# Patient Record
Sex: Male | Born: 1960 | Race: Black or African American | Hispanic: No | Smoking: Current every day smoker
Health system: Southern US, Community
[De-identification: ages and names within clinical notes are randomized; demographics above are authoritative.]

## PROBLEM LIST (undated history)

## (undated) DIAGNOSIS — F32A Depression, unspecified: Secondary | ICD-10-CM

## (undated) DIAGNOSIS — F419 Anxiety disorder, unspecified: Secondary | ICD-10-CM

## (undated) DIAGNOSIS — I639 Cerebral infarction, unspecified: Secondary | ICD-10-CM

## (undated) DIAGNOSIS — G4730 Sleep apnea, unspecified: Secondary | ICD-10-CM

## (undated) DIAGNOSIS — M1990 Unspecified osteoarthritis, unspecified site: Secondary | ICD-10-CM

## (undated) DIAGNOSIS — F17200 Nicotine dependence, unspecified, uncomplicated: Secondary | ICD-10-CM

## (undated) DIAGNOSIS — R569 Unspecified convulsions: Secondary | ICD-10-CM

## (undated) DIAGNOSIS — F319 Bipolar disorder, unspecified: Secondary | ICD-10-CM

## (undated) DIAGNOSIS — F329 Major depressive disorder, single episode, unspecified: Secondary | ICD-10-CM

## (undated) DIAGNOSIS — I739 Peripheral vascular disease, unspecified: Secondary | ICD-10-CM

## (undated) DIAGNOSIS — Z8659 Personal history of other mental and behavioral disorders: Secondary | ICD-10-CM

## (undated) DIAGNOSIS — I10 Essential (primary) hypertension: Secondary | ICD-10-CM

## (undated) DIAGNOSIS — F4310 Post-traumatic stress disorder, unspecified: Secondary | ICD-10-CM

## (undated) HISTORY — DX: Major depressive disorder, single episode, unspecified: F32.9

## (undated) HISTORY — DX: Essential (primary) hypertension: I10

## (undated) HISTORY — DX: Peripheral vascular disease, unspecified: I73.9

## (undated) HISTORY — DX: Nicotine dependence, unspecified, uncomplicated: F17.200

## (undated) HISTORY — DX: Anxiety disorder, unspecified: F41.9

## (undated) HISTORY — DX: Post-traumatic stress disorder, unspecified: F43.10

## (undated) HISTORY — DX: Depression, unspecified: F32.A

## (undated) HISTORY — DX: Unspecified convulsions: R56.9

## (undated) HISTORY — DX: Bipolar disorder, unspecified: F31.9

---

## 2002-06-23 ENCOUNTER — Inpatient Hospital Stay (HOSPITAL_COMMUNITY): Admission: EM | Admit: 2002-06-23 | Discharge: 2002-06-25 | Payer: Self-pay | Admitting: Emergency Medicine

## 2002-06-23 ENCOUNTER — Encounter: Payer: Self-pay | Admitting: Emergency Medicine

## 2002-06-24 ENCOUNTER — Encounter: Payer: Self-pay | Admitting: Internal Medicine

## 2002-07-04 ENCOUNTER — Encounter: Admission: RE | Admit: 2002-07-04 | Discharge: 2002-07-04 | Payer: Self-pay | Admitting: Internal Medicine

## 2006-07-01 ENCOUNTER — Emergency Department (HOSPITAL_COMMUNITY): Admission: EM | Admit: 2006-07-01 | Discharge: 2006-07-01 | Payer: Self-pay | Admitting: Emergency Medicine

## 2007-02-13 ENCOUNTER — Emergency Department (HOSPITAL_COMMUNITY): Admission: EM | Admit: 2007-02-13 | Discharge: 2007-02-13 | Payer: Self-pay | Admitting: Emergency Medicine

## 2007-11-18 ENCOUNTER — Emergency Department (HOSPITAL_COMMUNITY): Admission: EM | Admit: 2007-11-18 | Discharge: 2007-11-18 | Payer: Self-pay | Admitting: Emergency Medicine

## 2008-08-31 ENCOUNTER — Ambulatory Visit: Payer: Self-pay | Admitting: Family Medicine

## 2008-09-03 ENCOUNTER — Inpatient Hospital Stay (HOSPITAL_COMMUNITY): Admission: EM | Admit: 2008-09-03 | Discharge: 2008-09-04 | Payer: Self-pay | Admitting: Emergency Medicine

## 2008-09-06 ENCOUNTER — Ambulatory Visit: Payer: Self-pay | Admitting: Family Medicine

## 2008-10-05 ENCOUNTER — Ambulatory Visit: Payer: Self-pay | Admitting: Family Medicine

## 2008-11-02 ENCOUNTER — Ambulatory Visit: Payer: Self-pay | Admitting: Family Medicine

## 2008-11-30 ENCOUNTER — Ambulatory Visit: Payer: Self-pay | Admitting: Family Medicine

## 2009-11-07 ENCOUNTER — Emergency Department (HOSPITAL_COMMUNITY): Admission: EM | Admit: 2009-11-07 | Discharge: 2009-11-07 | Payer: Self-pay | Admitting: Emergency Medicine

## 2009-12-31 ENCOUNTER — Inpatient Hospital Stay (HOSPITAL_COMMUNITY): Admission: EM | Admit: 2009-12-31 | Discharge: 2010-01-02 | Payer: Self-pay | Admitting: Emergency Medicine

## 2010-01-07 ENCOUNTER — Ambulatory Visit: Payer: Self-pay | Admitting: Family Medicine

## 2010-01-17 ENCOUNTER — Ambulatory Visit: Payer: Self-pay | Admitting: Family Medicine

## 2010-06-13 ENCOUNTER — Ambulatory Visit: Payer: Self-pay | Admitting: Family Medicine

## 2010-06-14 ENCOUNTER — Ambulatory Visit (INDEPENDENT_AMBULATORY_CARE_PROVIDER_SITE_OTHER): Payer: Federal, State, Local not specified - PPO | Admitting: Family Medicine

## 2010-06-14 DIAGNOSIS — G40909 Epilepsy, unspecified, not intractable, without status epilepticus: Secondary | ICD-10-CM

## 2010-06-14 DIAGNOSIS — N390 Urinary tract infection, site not specified: Secondary | ICD-10-CM

## 2010-06-14 DIAGNOSIS — L989 Disorder of the skin and subcutaneous tissue, unspecified: Secondary | ICD-10-CM

## 2010-06-28 ENCOUNTER — Ambulatory Visit: Payer: Federal, State, Local not specified - PPO | Admitting: Family Medicine

## 2010-07-04 ENCOUNTER — Ambulatory Visit (INDEPENDENT_AMBULATORY_CARE_PROVIDER_SITE_OTHER): Payer: Federal, State, Local not specified - PPO | Admitting: Family Medicine

## 2010-07-04 DIAGNOSIS — N390 Urinary tract infection, site not specified: Secondary | ICD-10-CM

## 2010-07-26 LAB — PHENYTOIN LEVEL, TOTAL
Phenytoin Lvl: 12.8 ug/mL (ref 10.0–20.0)
Phenytoin Lvl: 6.7 ug/mL — ABNORMAL LOW (ref 10.0–20.0)

## 2010-07-26 LAB — DIFFERENTIAL
Basophils Absolute: 0 10*3/uL (ref 0.0–0.1)
Eosinophils Relative: 0 % (ref 0–5)
Lymphocytes Relative: 7 % — ABNORMAL LOW (ref 12–46)
Lymphs Abs: 0.7 10*3/uL (ref 0.7–4.0)
Lymphs Abs: 1 10*3/uL (ref 0.7–4.0)
Monocytes Absolute: 0.6 10*3/uL (ref 0.1–1.0)
Neutro Abs: 8.1 10*3/uL — ABNORMAL HIGH (ref 1.7–7.7)

## 2010-07-26 LAB — URINALYSIS, ROUTINE W REFLEX MICROSCOPIC
Bilirubin Urine: NEGATIVE
Protein, ur: NEGATIVE mg/dL
pH: 6 (ref 5.0–8.0)

## 2010-07-26 LAB — CBC
HCT: 37.7 % — ABNORMAL LOW (ref 39.0–52.0)
Hemoglobin: 12.7 g/dL — ABNORMAL LOW (ref 13.0–17.0)
MCH: 28.7 pg (ref 26.0–34.0)
MCH: 28.7 pg (ref 26.0–34.0)
MCHC: 33 g/dL (ref 30.0–36.0)
MCV: 85.9 fL (ref 78.0–100.0)
MCV: 86.9 fL (ref 78.0–100.0)
Platelets: 156 10*3/uL (ref 150–400)
RBC: 4.43 MIL/uL (ref 4.22–5.81)
RDW: 15.6 % — ABNORMAL HIGH (ref 11.5–15.5)
WBC: 11.6 10*3/uL — ABNORMAL HIGH (ref 4.0–10.5)

## 2010-07-26 LAB — BASIC METABOLIC PANEL
BUN: 20 mg/dL (ref 6–23)
CO2: 26 mEq/L (ref 19–32)
Chloride: 107 mEq/L (ref 96–112)
Creatinine, Ser: 1.39 mg/dL (ref 0.4–1.5)
GFR calc Af Amer: >60 mL/min (ref >60)
Glucose, Bld: 98 mg/dL (ref 70–99)
Sodium: 140 mEq/L (ref 135–145)

## 2010-07-26 LAB — COMPREHENSIVE METABOLIC PANEL
AST: 21 U/L (ref 0–37)
Albumin: 3.4 g/dL — ABNORMAL LOW (ref 3.5–5.2)
Alkaline Phosphatase: 61 U/L (ref 39–117)
Creatinine, Ser: 1.22 mg/dL (ref 0.4–1.5)
GFR calc Af Amer: >60 mL/min (ref >60)
Sodium: 139 mEq/L (ref 135–145)
Total Bilirubin: 0.5 mg/dL (ref 0.3–1.2)

## 2010-07-26 LAB — CULTURE, BLOOD (ROUTINE X 2): Culture: NO GROWTH

## 2010-07-26 LAB — TSH: TSH: 1.14 u[IU]/mL (ref 0.350–4.500)

## 2010-07-26 LAB — RAPID URINE DRUG SCREEN, HOSP PERFORMED
Barbiturates: NOT DETECTED
Benzodiazepines: NOT DETECTED
Cocaine: NOT DETECTED
Opiates: NOT DETECTED
Tetrahydrocannabinol: POSITIVE — AB

## 2010-07-26 LAB — URINE CULTURE
Culture  Setup Time: 201108230045
Culture: NO GROWTH

## 2010-07-26 LAB — URINE MICROSCOPIC-ADD ON

## 2010-07-28 LAB — BASIC METABOLIC PANEL
Calcium: 9.7 mg/dL (ref 8.4–10.5)
Chloride: 100 mEq/L (ref 96–112)
Creatinine, Ser: 1.32 mg/dL (ref 0.4–1.5)
GFR calc Af Amer: >60 mL/min (ref >60)
GFR calc non Af Amer: 58 mL/min — ABNORMAL LOW (ref >60)
Glucose, Bld: 110 mg/dL — ABNORMAL HIGH (ref 70–99)
Sodium: 137 mEq/L (ref 135–145)

## 2010-07-28 LAB — CBC
HCT: 40.5 % (ref 39.0–52.0)
Hemoglobin: 13.6 g/dL (ref 13.0–17.0)
MCH: 29 pg (ref 26.0–34.0)
MCHC: 33.6 g/dL (ref 30.0–36.0)
MCV: 86.4 fL (ref 78.0–100.0)
Platelets: 197 10*3/uL (ref 150–400)
RBC: 4.69 MIL/uL (ref 4.22–5.81)
WBC: 9.2 10*3/uL (ref 4.0–10.5)

## 2010-07-28 LAB — DIFFERENTIAL
Basophils Relative: 0 % (ref 0–1)
Eosinophils Absolute: 0 10*3/uL (ref 0.0–0.7)
Eosinophils Relative: 1 % (ref 0–5)
Lymphocytes Relative: 16 % (ref 12–46)
Lymphs Abs: 1.5 10*3/uL (ref 0.7–4.0)
Monocytes Absolute: 0.4 10*3/uL (ref 0.1–1.0)
Neutrophils Relative %: 80 % — ABNORMAL HIGH (ref 43–77)

## 2010-08-21 LAB — POCT I-STAT, CHEM 8
HCT: 48 % (ref 39.0–52.0)
Hemoglobin: 16.3 g/dL (ref 13.0–17.0)
Potassium: 4.2 mEq/L (ref 3.5–5.1)
Sodium: 139 mEq/L (ref 135–145)

## 2010-08-21 LAB — COMPREHENSIVE METABOLIC PANEL
ALT: 21 U/L (ref 0–53)
Albumin: 4.3 g/dL (ref 3.5–5.2)
Alkaline Phosphatase: 90 U/L (ref 39–117)
Alkaline Phosphatase: 94 U/L (ref 39–117)
BUN: 11 mg/dL (ref 6–23)
Creatinine, Ser: 1.36 mg/dL (ref 0.4–1.5)
Glucose, Bld: 121 mg/dL — ABNORMAL HIGH (ref 70–99)
Potassium: 3.5 mEq/L (ref 3.5–5.1)
Total Bilirubin: 0.5 mg/dL (ref 0.3–1.2)
Total Bilirubin: 0.6 mg/dL (ref 0.3–1.2)
Total Protein: 6.6 g/dL (ref 6.0–8.3)

## 2010-08-21 LAB — RAPID URINE DRUG SCREEN, HOSP PERFORMED
Barbiturates: NOT DETECTED
Cocaine: NOT DETECTED
Opiates: NOT DETECTED

## 2010-08-21 LAB — URINALYSIS, ROUTINE W REFLEX MICROSCOPIC
Bilirubin Urine: NEGATIVE
Ketones, ur: NEGATIVE mg/dL
Nitrite: NEGATIVE
Urobilinogen, UA: 1 mg/dL (ref 0.0–1.0)

## 2010-08-21 LAB — CBC
HCT: 42.6 % (ref 39.0–52.0)
HCT: 44.1 % (ref 39.0–52.0)
Hemoglobin: 14.3 g/dL (ref 13.0–17.0)
MCV: 85.8 fL (ref 78.0–100.0)
MCV: 86.7 fL (ref 78.0–100.0)
RBC: 5.14 MIL/uL (ref 4.22–5.81)
RDW: 16.1 % — ABNORMAL HIGH (ref 11.5–15.5)
RDW: 16.9 % — ABNORMAL HIGH (ref 11.5–15.5)

## 2010-08-21 LAB — PROTIME-INR
INR: 1 (ref 0.00–1.49)
Prothrombin Time: 13.6 seconds (ref 11.6–15.2)

## 2010-08-21 LAB — DIFFERENTIAL
Basophils Relative: 0 % (ref 0–1)
Eosinophils Relative: 2 % (ref 0–5)
Neutro Abs: 5.3 10*3/uL (ref 1.7–7.7)

## 2010-08-21 LAB — PHENYTOIN LEVEL, TOTAL
Phenytoin Lvl: 15 ug/mL (ref 10.0–20.0)
Phenytoin Lvl: 7 ug/mL — ABNORMAL LOW (ref 10.0–20.0)

## 2010-08-21 LAB — URINE CULTURE
Colony Count: NO GROWTH
Culture: NO GROWTH

## 2010-09-21 ENCOUNTER — Inpatient Hospital Stay (INDEPENDENT_AMBULATORY_CARE_PROVIDER_SITE_OTHER)
Admission: RE | Admit: 2010-09-21 | Discharge: 2010-09-21 | Disposition: A | Discharge status: Home / Self Care | Payer: Federal, State, Local not specified - PPO | Source: Ambulatory Visit | Attending: Emergency Medicine | Admitting: Emergency Medicine

## 2010-09-21 DIAGNOSIS — L03119 Cellulitis of unspecified part of limb: Secondary | ICD-10-CM

## 2010-09-21 DIAGNOSIS — T148 Other injury of unspecified body region: Secondary | ICD-10-CM

## 2010-09-24 ENCOUNTER — Encounter: Payer: Self-pay | Admitting: Family Medicine

## 2010-09-24 ENCOUNTER — Ambulatory Visit (INDEPENDENT_AMBULATORY_CARE_PROVIDER_SITE_OTHER): Payer: Federal, State, Local not specified - PPO | Admitting: Family Medicine

## 2010-09-24 VITALS — BP 160/100 | HR 76 | Wt 256.0 lb

## 2010-09-24 DIAGNOSIS — L03116 Cellulitis of left lower limb: Secondary | ICD-10-CM

## 2010-09-24 DIAGNOSIS — L02419 Cutaneous abscess of limb, unspecified: Secondary | ICD-10-CM

## 2010-09-24 NOTE — Discharge Summary (Signed)
Keith Dixon, Keith Dixon              ACCOUNT NO.:  [id number]   MEDICAL RECORD NO.:  [id number]          PATIENT TYPE:  INP   LOCATION:  5502                         FACILITY:  [facility]   PHYSICIAN:  Ruthy Dick, MD    DATE OF BIRTH:  [birth year]   DATE OF ADMISSION:  09/03/2008  DATE OF DISCHARGE:  09/04/2008                               DISCHARGE SUMMARY   REASON FOR ADMISSION:  Seizure disorder and hypertensive crisis.   FINAL DISCHARGE DIAGNOSES:  1. Recurrent seizures (seizure disorder).  2. Subtherapeutic Dilantin level.  3. Urosepsis.  4. Tolerated hypertension.  5. Polysubstance abuse.  6. Noncompliance.  7. Marijuana abuse.  8. Posttraumatic stress disorder.   CONSULTS DURING THIS ADMISSION:  Neurology consult.   PROCEDURES DONE DURING THIS ADMISSION:  1. CT scan of the head with contrast which did not show any acute      intracranial abnormalities.  2. EKG was done which has not been reported at this time.   BRIEF HISTORY OF PRESENT ILLNESS AND HOSPITAL COURSE:  This is a 47-year  African American male who seems to be very noncompliant with his  medications and has a history of seizure disorder and adjustment  disorder.  He presented to the hospital.  The reason for his seizures  seems to be that he has been very noncompliant with his medication.  At  the current time, he is not any of his medications for about 4 years now  and just recently saw Dr. Susann Givens who restarted on his medications.  He  says he has an appointment with Dr. Susann Givens on Wednesday, September 06, 2008, and we recommend that he goes for this appointment.  Today, the  patient has done well, has not had any seizures since admission, no  chest pain, no shortness of breath, no abdominal pain, no nausea, no  vomiting, no diarrhea, no constipation.   VITAL SIGNS:  Temperature of 98.2, pulse 70, respirations 20, blood  pressure 140/82, and saturating 94% on room air.  CHEST:  Clear to auscultation  bilaterally.  No rhonchi, no rales, no  wheezing.  ABDOMEN:  Soft and nontender.  EXTREMITIES:  No clubbing, no cyanosis, no edema.  CARDIOVASCULAR:  First and second heart sounds only.  CENTRAL NERVOUS SYSTEM:  Nonfocal.   Time used for discharge planning is less than 30 minutes.  Again,  followup with Dr. Susann Givens should be on Tuesday, September 06, 2008, and  according to the patient this appointment has already been set.  We are  going to discharge him home today.   DISCHARGE MEDICATIONS:  1. Dilantin 400 mg p.o. nightly.  2. Lisinopril and hydrochlorothiazide 20/12.5 mg p.o. daily.  3. Cipro 500 mg p.o. b.i.d. for 2 days.      Ruthy Dick, MD  Electronically Signed     GU/MEDQ  D:  09/04/2008  T:  09/05/2008  Job:  [id number]   cc:   Sharlot Gowda, M.D.

## 2010-09-24 NOTE — Patient Instructions (Signed)
Keep her leg elevated. Continue on the antibiotic and pain medications. Turn here on Friday. If this gets worse before then call for an appointment

## 2010-09-24 NOTE — Progress Notes (Signed)
  Subjective:    Patient ID: Keith Dixon, male    DOB: 29-Apr-1961, 50 y.o.   MRN: [id number]  HPI he is here for evaluation of an infection on his left calf. Noted an itching sensation Sergi morning and severity evening noted some swelling about the size of a dime. He was seen in an urgent care Center. They placed him on antibiotic and given pain medications as well as a topical ointment. He is here for a recheck. He states that the pain and welling is less however is more erythematous. Continues on meds as listed in the chart. He is having some slight diaphoresis.    Review of Systems     Objective:   Physical Exam alert and slightly diaphoretic. Left lateral calf and the mid position shows a listed lesion approximately 3 cm in size. It has surrounding erythema and slightly warm to touch.        Assessment & Plan:  Left calf daylight is. The wound was opened and cultured. We. Material was expressed. The wound was dressed. He can change the dressing daily.

## 2010-09-24 NOTE — H&P (Signed)
Keith Dixon, Keith Dixon              ACCOUNT NO.:  [id number]   MEDICAL RECORD NO.:  [id number]          PATIENT TYPE:  INP   LOCATION:  1826                         FACILITY:  [facility]   PHYSICIAN:  Darryl D. Prime, MD    DATE OF BIRTH:  Jul 28, 1960   DATE OF ADMISSION:  09/02/2008  DATE OF DISCHARGE:                              HISTORY & PHYSICAL   PRIMARY CARE PHYSICIAN:  Sharlot Gowda, M.D.   CHIEF COMPLAINT:  The patient was drowsy. Per his significant other and  his sister he has been having seizures.   HISTORY OF PRESENT ILLNESS:  Keith Dixon is a 50 year old male with a  history of seizure disorder who while at work around 1:00 p.m., had  apparent seizure. The co-workers brought him home. 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 Unsure of the details of that seizure but apparently,  it was typical for his type of seizures. He has a history of partial  seizures. The patient was at home and had another episodes of seizure  and he was brought because of that. He had a seizure in triage and then  2 seizures in the emergency room here, witnessed by the nurse  practitioner. He had smacking of the lips and his head turned to the  right. He was sweaty and big-eyed and flaccid until he was given Ativan.  He was loaded with Dilantin as well, 1000 mg. He was seen by neurology,  who recommended him continuing his Dilantin, checking a Dilantin level  in the morning. He has been non-compliant in the past and missed his  Dilantin dose last night.   PAST MEDICAL HISTORY:  1. History of pneumonia with rhabdomyolysis and hypoxia at that time.      This was in 2004.  2. History of possible polysubstance abuse.  3. History of seizure disorder as above.  4. History of hypertension.  5. History of post-traumatic stress disorder.   ALLERGIES:  NO KNOWN DRUG ALLERGIES.   MEDICATIONS:  1. Dilantin 400 mg q.h.s.   SOCIAL HISTORY:  Positive for Cannabis use. Occasional alcohol but this  has been  very heavy in the past. History of tobacco use, which is  ongoing.   FAMILY HISTORY:  Mother had seizures.   REVIEW OF SYSTEMS:  14 point review of systems negative unless stated  above.   PHYSICAL EXAMINATION:  VITAL SIGNS:  Temperature 99.2, pulse 100,  respiratory rate 20, sating 98% on room air. Blood pressure 220/117.  GENERAL:  The patient is in no acute distress. Lying flat in bed. He is  drowsy.  HEENT:  Normocephalic and atraumatic. Pupils are equal, round, and  reactive to light. Extraocular muscles intact. The oropharynx is dry.  Poor dentition.  NECK:  Supple. No lymphadenopathy or thyromegaly. No carotid bruits.  LUNGS:  Clear to auscultation bilaterally.  CARDIOVASCULAR:  Significant for an S4 but otherwise regular rate and  rhythm. No murmurs.  ABDOMEN:  Soft, nontender, and nondistended. No hepatosplenomegaly.  EXTREMITIES:  No clubbing, cyanosis, or edema.  NEUROLOGIC:  Alert and oriented x4. Cranial nerves 2-12 are  grossly  intact. Strength and sensation are grossly intact. I did not put a pin  light to his eyes for risk of inducing possible seizures.   LABORATORY DATA:  INR is 1.0 with a PT of 13.6, lipase 18, alcohol less  than 5. Urine drug screen positive for Cannabis. Dilantin level was 7.  Urinalysis showed a WBC of 7 to 10 with moderate leukocytes. Sodium 137,  potassium 4.1, chloride 104, bicarb 26, BUN 15 and a creatinine of 1.36.  Glucose 106, otherwise normal liver function studies. His CBC:  White  count is 7.0. Hemoglobin and hematocrit 14.8 and 44.1. Platelets  142,000. Segs of 76%.   CT of the head was negative for any intracranial abnormality. Chest x-  ray showed no acute cardiopulmonary disease. CT of the head is negative  for any intracranial abnormalities, as stated prior.   ASSESSMENT:  The patient with a history of seizures, now having acute  recurrent partial seizures, secondary to medical non-adherence.   PLAN:  At this time, he will  be observed closely with frequent  neurologic checks. Will get a urine culture to see if he indeed has a  urinary tract infection and recheck the UA. DVT and GI prophylaxis will  be ordered. Will counsel concerning the discontinuation of tobacco and  the use of Cannabis. For the ongoing seizures, we will check a Dilantin  level. Will continue Dilantin 400 q.h.s. and place on Ativan p.r.n.      Darryl D. Prime, MD  Electronically Signed     DDP/MEDQ  D:  09/03/2008  T:  09/03/2008  Job:  [id number]   cc:   Pramod P. Pearlean Brownie, MD

## 2010-09-24 NOTE — Consult Note (Signed)
NAMECARMON, Keith Dixon NO.:  [id number]   MEDICAL RECORD NO.:  [id number]          PATIENT TYPE:  INP   LOCATION:  1826                         FACILITY:  [facility]   PHYSICIAN:  Noel Christmas, MD    DATE OF BIRTH:  07-19-1960   DATE OF CONSULTATION:  09/02/2008  DATE OF DISCHARGE:                                 CONSULTATION   REFERRING PHYSICIAN:  Carleene Cooper, MD   REASON FOR CONSULTATION:  Recurrent seizure.   HISTORY OF PRESENT ILLNESS:  This is a 50 year old man with a history of  complex partial seizure disorder who presented with history of recurrent  partial seizure tonight.  The patient apparently was at work when the  seizure occurred.  A second seizure was witnessed by family members at  home.  The patient had staring spell with attentiveness followed by  confusion.  There is no history of generalized seizure activity nor  generalized seizure disorder.  He has been on Dilantin 400 mg at  bedtime.  He admits to less than ideal compliance and did not take  Dilantin last night.  Dilantin level in the emergency room here was 7.0  (normally it was 10.0-20.0).  The CT scan was obtained, which showed no  intracranial abnormality.  The patient was noted to also have markedly  elevated blood pressure on presentation to the emergency room (220/117).  He has not been taking blood pressure medicines, but has a past history  of hypertension.   PAST MEDICAL HISTORY:  Remarkable for seizure disorder, complex partial,  hypertension, and posttraumatic stress disorder.   CURRENT MEDICATIONS:  Dilantin 400 mg nightly.   FAMILY HISTORY:  Noncontributory.   PHYSICAL EXAMINATION:  Appearance was that of a middle-aged man who was  moderately overweight.  He was drowsy, but easy to arouse.  He was well  oriented to time as well as place.  Memory was normal except for period  of time during, which he was experiencing seizure activity.  Affect was  appropriate.  His  pupils were equal and reactive normally to light.  Extraocular movements and visual fields were normal.  There was no  facial weakness.  Hearing was normal.  Speech and palate movement were  normal.  Motor exam shows normal strength and normal tone throughout.  Deep tendon reflexes were normal and symmetrical.  Plantar responses  were flexor.  Sensory exam was normal.  Carotid auscultation was normal.   CLINICAL IMPRESSION:  1. Recurrent partial seizure secondary to low Dilantin level.  2. Uncontrolled hypertension.  3. Compliance issues with taking anticonvulsant medication as well as      antihypertensive medication.   RECOMMENDATIONS:  1. I agree with Dilantin load IV given followed by resumption of his      usual daily dose of 400 mg p.o.  2. Hypertension management per primary care team.  3. Follow up with Dr. Pearlean Brownie (Neurology) on routine basis.   Thank you for asking me to evaluate Mr. Masterson.      Noel Christmas, MD  Electronically Signed     CS/MEDQ  D:  09/02/2008  T:  09/03/2008  Job:  [id number]

## 2010-09-27 ENCOUNTER — Ambulatory Visit (INDEPENDENT_AMBULATORY_CARE_PROVIDER_SITE_OTHER): Payer: Federal, State, Local not specified - PPO | Admitting: Family Medicine

## 2010-09-27 ENCOUNTER — Encounter: Payer: Self-pay | Admitting: Family Medicine

## 2010-09-27 VITALS — BP 150/90 | HR 72 | Wt 258.0 lb

## 2010-09-27 DIAGNOSIS — L02419 Cutaneous abscess of limb, unspecified: Secondary | ICD-10-CM

## 2010-09-27 LAB — WOUND CULTURE: Gram Stain: NONE SEEN

## 2010-09-27 MED ORDER — LEVOFLOXACIN 500 MG PO TABS: 500.0000 mg | ORAL_TABLET | Freq: Every day | ORAL | Status: AC

## 2010-09-27 NOTE — Progress Notes (Signed)
  Subjective:    Patient ID: Keith Dixon, male    DOB: August 02, 1960, 50 y.o.   MRN: [id number]  HPI he is here for recheck on his leg infection. The culture came back with Staphylococcus sensitive to cephalosporin. He states it is causing less discomfort.    Review of Systems     Objective:   Physical Exam left leg exam does show less erythema and swelling. No tenderness to palpation       Assessment & Plan:  Resolving left leg cellulitis Will switch him to Levaquin. He will get a note to return to work Monday

## 2010-09-27 NOTE — Patient Instructions (Signed)
Keep the foot elevated through the weekend and he may wash it with soap and water. He may return to work on Monday unless there is any question and then return here for followup visit

## 2010-09-27 NOTE — Discharge Summary (Signed)
NAME:  CLAUDIO, MONDRY                        ACCOUNT NO.:  [id number]   MEDICAL RECORD NO.:  [id number]                   PATIENT TYPE:  INP   LOCATION:  5702                                 FACILITY:  [facility]   PHYSICIAN:  Madaline Guthrie, M.D.                 DATE OF BIRTH:  11/03/1960   DATE OF ADMISSION:  06/23/2002  DATE OF DISCHARGE:  06/25/2002                                 DISCHARGE SUMMARY   DISCHARGE DIAGNOSES:  1. Pneumonia.  2. Hypoxia.  3. Rhabdomyolysis.  4. Hypertension.  5. Pyuria.  6. Polysubstance use.   DISCHARGE MEDICATIONS:  1. Tequin 400 mg p.o. once daily.  2. Hydrochlorothiazide 25 mg p.o. once daily.   DISPOSITION AND FOLLOWUP:  The patient was discharged home in stable  condition.  He will be contacted by the internal medicine outpatient clinic  to arrange followup appointment.   PROCEDURE:  CT of the head without contrast was unremarkable.   ADMISSION HISTORY AND PHYSICAL EXAMINATION:   HISTORY OF PRESENT ILLNESS:  For full details of admission history and  physical please see the chart.  In brief, the patient is a 50 year old  African-American male with a past medical history significant for  questionable hypertension who was fine up until the morning of admission  when his girlfriend was notified by her children that he was breathing  funny.  She checked on him at that time and he was breathing fast and  making a humming nose when he breathed.  She also noticed that he had bitten  his tongue and that he had blood dripping from the left side of his mouth.  He was awake but confused and did not know who his girlfriend was.  He went  outside with only his shorts on and had steam coming from his body.  His  girlfriend brought him to the ED at which time she states that he is a lot  better now.  He had no loss of bladder and bowel function, has no history  of asthma or seizures, had no witnessed seizure activity.  He received  Rocephin and  azithromycin in the emergency department.   PHYSICAL EXAMINATION:  VITAL SIGNS:  Temperature 100.5 that went down to  98.2, pulse 91, respirations 26, blood pressure 175/91, O2 saturations 93%  on 2 L.  GENERAL:  The patient was alert and oriented x3.  He was conversant and  pleasant.  HEENT:  Pupils were equally round and reactive to light and accommodation.  Extraocular movements were intact.  He had an injected conjunctiva.  Fundi  were negative for exudate or hemorrhage.  Oropharynx was clear with no  blood.  There was evidence of trauma on the left tongue.  He had no tongue  deviation.  NECK:  Neck was supple without lymphadenopathy or meningismus.  RESPIRATORY:  Lungs had diffuse rales bilaterally, right greater than left.  CARDIOVASCULAR:  Heart  was regular rate and rhythm; no murmurs, gallops, or  rubs.  ABDOMEN:  Soft, nontender, nondistended, with positive bowel sounds; no  hepatosplenomegaly.  EXTREMITIES:  Warm with no cyanosis, clubbing, or edema.  NEUROLOGICAL:  Intact sensation throughout as well as 5/5 motor strength in  bilateral upper and lower extremities.  Deep tendon reflexes were normal.   LABORATORY DATA:  Labs demonstrated a CBC with a white count of 7.4,  hemoglobin 16.8, platelets of 167, MCV of 86.2, 81% neutrophils.  Complete  metabolic profile was remarkable for a mild hyperglycemia of 112.  Alcohol  level less than 5.  UA significant for trace hemoglobin, 30 of protein,  small leukocyte esterase, negative nitrites, 7-10 white blood cells, and  hyaline casts.  Urine drug screen was positive for marijuana.  An arterial  blood gas on 2 L demonstrated a pH of 7.395, pCO2 of 36, pO2 54, bicarb 22  with 89% saturation.  EKG showed normal sinus rhythm at 85 beats per minute  with questionable T-wave inversion in V6.  Chest x-ray showed a probable  left-upper-lobe-greater-than-right-upper-lobe pneumonia with a prominent  rounded configuration of the heart.  A PA  and lateral was repeated which  showed a normal heart configuration.   ASSESSMENT AND PLAN:  The patient was admitted to the medical teaching  service for evaluation and treatment of his probable pneumonia and hypoxia.   HOSPITAL COURSE:  Problem 1. PNEUMONIA AND HYPOXIA.  The patient was started  on Rocephin and azithromycin for treatment of community-acquired pneumonia.  His hypoxia resolved quickly after starting treatment.  Sputum cultures were  ordered and were pending at the time of discharge.  His O2 saturation the  day of discharge was 96% on room air.  The patient was discharged on a  course of Tequin.   Problem 2. RHABDOMYOLYSIS.  The patient had some mild rhabdomyolysis which  was thought to be secondary to a seizure which was probably due to hypoxia  from his pneumonia.  His CK decreased from 3333 to 2080 the day of  discharge.  His renal function remained normal throughout his  hospitalization.   Problem 3. HYPERTENSION.  The patient was started on hydrochlorothiazide for  treatment of his hypertension and this should be followed up as an  outpatient.   Problem 4. SEIZURES.  Although the patient had no witnessed seizure  activity, it was thought that he likely had a seizure secondary to probable  hypoxia from his pneumonia.  CT of the head was negative and no other workup  was done.   DISCHARGE LABORATORIES:  Day of discharge white blood cell count 9.1,  hemoglobin 13.4, platelets 145,000.  Basic metabolic profile the day of  discharge showed a sodium of 138, potassium 3.9, chloride 108, CO2 24, BUN  10, creatinine 1.2, glucose 105, calcium 8.8.  HIV was nonreactive.  Urine  culture showed Streptococcus viridans.  Sputum culture showed normal  oropharyngeal flora.  Smear of the sputum was negative for acid-fast bacilli  however, the culture is pending.   ISSUES TO BE FOLLOWED UP AS AN OUTPATIENT:  1. Hypertension.  2. Urinary tract infection. 3. Renal function  after rhabdomyolysis.     Michel Harrow, M.D.                        Madaline Guthrie, M.D.    KB/MEDQ  D:  06/28/2002  T:  06/28/2002  Job:  [id number]

## 2010-10-01 ENCOUNTER — Telehealth: Payer: Self-pay | Admitting: Family Medicine

## 2010-10-01 NOTE — Telephone Encounter (Signed)
Called [facility] [address] for the bactroban ointment with 1 refill

## 2010-11-06 ENCOUNTER — Inpatient Hospital Stay (HOSPITAL_COMMUNITY)
Admission: AD | Admit: 2010-11-06 | Discharge: 2010-11-08 | DRG: 180 | Disposition: A | Discharge status: Home / Self Care | Payer: Federal, State, Local not specified - PPO | Source: Ambulatory Visit | Attending: Hospitalist | Admitting: Hospitalist

## 2010-11-06 ENCOUNTER — Telehealth: Payer: Self-pay | Admitting: Medical

## 2010-11-06 ENCOUNTER — Ambulatory Visit (INDEPENDENT_AMBULATORY_CARE_PROVIDER_SITE_OTHER): Payer: Federal, State, Local not specified - PPO | Admitting: Medical

## 2010-11-06 ENCOUNTER — Encounter: Payer: Self-pay | Admitting: Medical

## 2010-11-06 ENCOUNTER — Ambulatory Visit
Admission: RE | Admit: 2010-11-06 | Discharge: 2010-11-06 | Disposition: A | Discharge status: Home / Self Care | Payer: Federal, State, Local not specified - PPO | Source: Ambulatory Visit | Attending: Medical | Admitting: Medical

## 2010-11-06 DIAGNOSIS — R630 Anorexia: Secondary | ICD-10-CM

## 2010-11-06 DIAGNOSIS — R109 Unspecified abdominal pain: Secondary | ICD-10-CM

## 2010-11-06 DIAGNOSIS — I10 Essential (primary) hypertension: Secondary | ICD-10-CM | POA: Diagnosis present

## 2010-11-06 DIAGNOSIS — E875 Hyperkalemia: Secondary | ICD-10-CM | POA: Diagnosis present

## 2010-11-06 DIAGNOSIS — R110 Nausea: Secondary | ICD-10-CM

## 2010-11-06 DIAGNOSIS — F4310 Post-traumatic stress disorder, unspecified: Secondary | ICD-10-CM | POA: Diagnosis present

## 2010-11-06 DIAGNOSIS — K56609 Unspecified intestinal obstruction, unspecified as to partial versus complete obstruction: Principal | ICD-10-CM | POA: Diagnosis present

## 2010-11-06 DIAGNOSIS — F319 Bipolar disorder, unspecified: Secondary | ICD-10-CM | POA: Diagnosis present

## 2010-11-06 DIAGNOSIS — G40909 Epilepsy, unspecified, not intractable, without status epilepticus: Secondary | ICD-10-CM | POA: Diagnosis present

## 2010-11-06 DIAGNOSIS — F17200 Nicotine dependence, unspecified, uncomplicated: Secondary | ICD-10-CM | POA: Diagnosis present

## 2010-11-06 LAB — POCT URINALYSIS DIPSTICK
Glucose, UA: NEGATIVE
Ketones, UA: NEGATIVE
Spec Grav, UA: 1.01

## 2010-11-06 LAB — CBC WITH DIFFERENTIAL/PLATELET
Eosinophils Absolute: 0.2 10*3/uL (ref 0.0–0.7)
Lymphocytes Relative: 26 % (ref 12–46)
Lymphs Abs: 1.4 10*3/uL (ref 0.7–4.0)
Neutrophils Relative %: 50 % (ref 43–77)
Platelets: 241 10*3/uL (ref 150–400)
RBC: 4.98 MIL/uL (ref 4.22–5.81)
WBC: 5.5 10*3/uL (ref 4.0–10.5)

## 2010-11-06 LAB — COMPREHENSIVE METABOLIC PANEL
Albumin: 4.1 g/dL (ref 3.5–5.2)
CO2: 26 mEq/L (ref 19–32)
Glucose, Bld: 113 mg/dL — ABNORMAL HIGH (ref 70–99)
Potassium: 3.3 mEq/L — ABNORMAL LOW (ref 3.5–5.3)
Sodium: 137 mEq/L (ref 135–145)
Total Protein: 6.6 g/dL (ref 6.0–8.3)

## 2010-11-06 LAB — TSH: TSH: 0.926 u[IU]/mL (ref 0.350–4.500)

## 2010-11-06 LAB — MAGNESIUM: Magnesium: 2.1 mg/dL (ref 1.5–2.5)

## 2010-11-06 NOTE — Progress Notes (Signed)
  Subjective:   HPI  Keith Dixon is a 50 y.o. male who presents for abdominal pain and inability to pass stool x 5 days. He notes for the last 2 weeks he has had a change in his bowel movements. This started when he was finishing up an antibiotic (Levaquin) for recent leg infection that came after an insect bite. For the last 2 weeks he mostly has had 2-3 loose stools daily, however he has had no bowel movement at all since last Thursday 5 days ago. At first he was able to pass little gas, and now he is not able pass anything per rectum. He has subsequently had lower abdominal pain, increased nausea, decreased appetite and starting get some back pain as well.  No other aggravating or relieving factors.  No other c/o.  Last meal was half of a sub last evening.  NPO since last night.  Of note, he has lost 10 lb unexpectedly in the last month.  Prior to 1 mo ago his stool was normal, no hx/o constipation.  He denies blood in stool.  Denies prior colonoscopy or prior abdominal surgeries.  The following portions of the patient's history were reviewed and updated as appropriate: allergies, current medications, past family history, past medical history, past social history, past surgical history and problem list.  Past Medical History  Diagnosis Date  . Seizures   . Hypertension   . PTSD (post-traumatic stress disorder)   . Bipolar disorder   . Tobacco use disorder    History reviewed. No pertinent past surgical history.  Family History  Problem Relation Age of Onset  . Seizures Mother   . Cancer Mother     breast    Review of Systems Constitutional: +unexpected weight change, anorexia; denies fever, chills, sweats Cardiology: denies chest pain, palpitations, edema Respiratory: denies cough, shortness of breath, wheezing Gastroenterology: + abdominal pain, nausea; denies vomiting, blood in stool.  Musculoskeletal: denies arthralgias, myalgias Urology: denies dysuria, difficulty urinating,  hematuria, urinary frequency, urgency Neurology: no headache, weakness, tingling, numbness     Objective:   Physical Exam  General appearance: alert, WD/WN, in pain, lying on exam table, black male Skin: warm ,dry  Oral cavity: MMM, poor dentition Neck: supple, no lymphadenopathy, no thyromegaly, no masses Heart: RRR, normal S1, S2, no murmurs Lungs: CTA bilaterally, no wheezes, rhonchi, or rales Abdomen: diffusely decreased bowel sounds, dull to percussion, tender supra pubically, but no obvious distention, masses, no hepatomegaly, no splenomegaly Back: left CVA tenderness Extremities: no edema, no cyanosis, no clubbing Pulses: 2+ symmetric GU: non tender, normal male genitalia, no masses, no hernia Rectal: anus normal appearing, internal hemorrhoid palpated, but no stool palpated, only anterior portion of prostate palpated which was normal, occult neg stool  Assessment :    Encounter Diagnoses  Name Primary?  . Abdominal pain Yes  . Nausea   . Anorexia      Plan:   Discussed possible etiologies including bowel obstruction,constipation gastroparesis, or other. We will send for labs and KUB stat. He has not had anything by mouth since last evening, and I asked him to remain n.p.o. until he hears back from [place] in a couple hours.

## 2010-11-07 ENCOUNTER — Inpatient Hospital Stay (HOSPITAL_COMMUNITY): Payer: Federal, State, Local not specified - PPO

## 2010-11-07 LAB — CBC
HCT: 38.6 % — ABNORMAL LOW (ref 39.0–52.0)
Hemoglobin: 13.5 g/dL (ref 13.0–17.0)
MCH: 28.5 pg (ref 26.0–34.0)
MCHC: 35 g/dL (ref 30.0–36.0)

## 2010-11-07 LAB — BASIC METABOLIC PANEL
BUN: 12 mg/dL (ref 6–23)
Calcium: 8.6 mg/dL (ref 8.4–10.5)
Creatinine, Ser: 1.11 mg/dL (ref 0.50–1.35)
GFR calc non Af Amer: >60 mL/min (ref >60)
Glucose, Bld: 110 mg/dL — ABNORMAL HIGH (ref 70–99)

## 2010-11-07 LAB — LIPID PANEL
HDL: 30 mg/dL — ABNORMAL LOW (ref >39)
LDL Cholesterol: 87 mg/dL (ref 0–99)

## 2010-11-08 LAB — BASIC METABOLIC PANEL
CO2: 24 mEq/L (ref 19–32)
Calcium: 8.2 mg/dL — ABNORMAL LOW (ref 8.4–10.5)
Chloride: 106 mEq/L (ref 96–112)
Sodium: 138 mEq/L (ref 135–145)

## 2010-11-08 LAB — MAGNESIUM: Magnesium: 1.9 mg/dL (ref 1.5–2.5)

## 2010-11-08 LAB — PHOSPHORUS: Phosphorus: 2.7 mg/dL (ref 2.3–4.6)

## 2010-11-08 NOTE — Telephone Encounter (Signed)
Called pt to discuss results.

## 2010-11-09 NOTE — Discharge Summary (Signed)
  Keith Dixon, Keith Dixon              ACCOUNT NO.:  [id number]  MEDICAL RECORD NO.:  [id number]  LOCATION:  5506                         FACILITY:  [facility]  PHYSICIAN:  Sundra Aland, MD      DATE OF BIRTH:  Jan 21, 1961  DATE OF ADMISSION:  11/06/2010 DATE OF DISCHARGE:  11/08/2010                              DISCHARGE SUMMARY   DISCHARGE DISPOSITION:  Home.  DISCHARGE DIAGNOSES: 1. Transient small bowel obstruction, which is resolved. 2. Hyperkalemia, being corrected. 3. Seizure disorder. 4. Bipolar disorder. 5. Posttraumatic stress syndrome. 6. Tobacco abuse.  DISCHARGE MEDICATIONS:  The patient is currently on hold, home medications prior to admission.  PROCEDURE: 1. KUB on the day of admission showed air filled small bowel loops     suggest presence of small bowel obstruction. 2. Repeat KUB done the next day showing enteric tube evident, small     bowel dilatation seen on previous study no longer evident.  Bowel     gas pattern is nonspecific and nonobstructive.  HOSPITAL COURSE:  [name] is a 50 year old African American male who was admitted because of nausea, vomiting, concerning for small bowel obstruction.  KUB suggestive small bowel obstruction and the patient was placed on NG tube as well as IV fluids.  KUB which was done serially showed no more obstructive patten.  NG tube was pulled.  The patient was started on clear liquid, he tolerated it very well.  Diet is being advanced.  Potassium is being corrected. If the patient tolerates lunch this afternoon, he will be discharged to home.  His vital signs stable.  Blood pressure 149/69, heart rate 73, respirations 18, temperature 97.9, saturation 100% on room air.  He will therefore be discharged in stable clinical condition.  DISCHARGE MEDICATIONS:  The patient will continue on all her medications prior to admission, no additional medications ordered.     Sundra Aland, MD    LA/MEDQ  D:   11/08/2010  T:  11/09/2010  Job:  [id number]  Electronically Signed by Sundra Aland MD on 11/09/2010 06:34:05 PM

## 2010-12-21 NOTE — H&P (Signed)
Keith Dixon, SEAGO NO.:  [id number]  MEDICAL RECORD NO.:  [id number]  LOCATION:  5506                         FACILITY:  [facility]  PHYSICIAN:  Peggye Pitt, M.D. DATE OF BIRTH:  January 22, 1961  DATE OF ADMISSION:  11/06/2010 DATE OF DISCHARGE:                             HISTORY & PHYSICAL   PRIMARY CARE PHYSICIAN:  Kristian Covey, PA with [facility].  CHIEF COMPLAINT:  Abdominal pain, nausea, and vomiting.  HISTORY OF PRESENT ILLNESS:  Keith Dixon is a pleasant 50 year old obese African American gentleman who has a history of hypertension and seizure disorder who presents to the hospital today with complaints of abdominal pain and constipation for the past 5 days.  He was seen in his PCPs office today and deferred to [place] for admission once he was found to have a small bowel obstruction per x-ray.  The patient notes that for the last 2 weeks, he has had a change in his bowel movements and this started as he was finishing up an antibiotic for recent leg infection after an insect bite.  For the last 2 weeks, he has had 2-3 loose stools a day.  However, no bowel movement since Thursday which would have been 5 days ago.  He subsequently to the above lower abdominal pain, has nausea, decreased appetite, and some mild back pain.  Because of these issues, we were asked to admit him for further evaluation.  ALLERGIES:  He has no allergies.  PAST MEDICAL HISTORY:  Significant for, 1. Seizure disorder. 2. Hypotension. 3. Post traumatic stress disorder. 4. Tobacco abuse. 5. Reported bipolar disorder.  HOME MEDICATIONS: 1. Norvasc 10 mg daily. 2. Lamictal 100 mg twice daily. 3. Lisinopril/hydrochlorothiazide 20/12.5 mg daily. 4. Dilantin 100 mg b.i.d.  SOCIAL HISTORY:  He smokes about three-quarters of pack a day and has done so for many years.  Denies any alcohol or illicit drug use.  FAMILY HISTORY:  Significant for breast cancer and seizure  disorder in his mother.  REVIEW OF SYSTEMS:  Negative except as mentioned in history of present illness.  PHYSICAL EXAMINATION:  VITAL SIGNS:  On admission blood pressure 164/101, heart rate 76, respirations 20, sats of 98% on room air, and temp 98.2. GENERAL:  He is currently alert, awake, and oriented x3. HEENT:  Normocephalic, atraumatic.  His pupils are equally round and reactive to light.  He has very poor dentition. NECK:  Supple.  No JVD, no lymphadenopathy, no bruits, no goiter. HEART:  Regular rate and rhythm without murmurs, rubs, or gallops. LUNGS:  Clear bilaterally. ABDOMEN:  Obese, distended.  He does have positive bowel sounds.  It is not tender to palpation. EXTREMITIES:  He has no edema.  He has positive pulses. NEUROLOGIC:  Appears to be grossly intact and nonfocal.  LABORATORY DATA:  On admission done in his PCPs office show a sodium of 137, potassium 3.3, chloride 102, bicarb 26, BUN 9, and creatinine 1.19 with a glucose of 113.  All of his LFTs are within normal limits.  WBCs of 5.5, hemoglobin 14.2, and platelets of 241.  Urinalysis was negative.  IMAGING DATA:  An abdominal x-ray that showed air-filled small bowel loops, which may  suggest a partial small bowel obstruction.  ASSESSMENT AND PLAN: 1. Abdominal pain, nausea, and vomiting.  This would appear to be a     small bowel obstruction per x-ray report.  At this moment, we will     admit [name] to the hospital.  We will keep him n.p.o. for     tonight.  Given his degree of distention, I will go ahead and place     an NG tube to intermittent suction.  We will repeat another KUB in     the morning to follow up on his small bowel obstruction.  Consider     a surgical evaluation if fails to improve.  He is also very     concerned about his lack of bowel movements.  I will go ahead and     start him on some on MiraLax. 2. For his hypertension, blood pressure is currently elevated.     However, because I  am keeping him n.p.o. because of his small bowel     obstruction, I will go ahead and place him on IV hydralazine to be     given as needed. 3. For his seizure disorder, I will go ahead and place him on IV     Dilantin at this point.  He has had no evidence of active seizures     at this time. 4. For DVT prophylaxis, I will place him on Lovenox.     Peggye Pitt, M.D.     EH/MEDQ  D:  11/06/2010  T:  11/06/2010  Job:  [id number]  Electronically Signed by Peggye Pitt M.D. on 12/21/2010 01:52:35 PM

## 2011-01-29 ENCOUNTER — Other Ambulatory Visit: Payer: Self-pay | Admitting: Family Medicine

## 2011-02-06 LAB — POCT I-STAT, CHEM 8
Glucose, Bld: 120 — ABNORMAL HIGH
HCT: 49
Hemoglobin: 16.7
Potassium: 5.5 — ABNORMAL HIGH

## 2011-02-20 LAB — RAPID URINE DRUG SCREEN, HOSP PERFORMED: Cocaine: NOT DETECTED

## 2011-02-20 LAB — I-STAT 8, (EC8 V) (CONVERTED LAB)
Acid-base deficit: 6 — ABNORMAL HIGH
Glucose, Bld: 87
HCT: 49
Hemoglobin: 16.7
Potassium: 4.5
Sodium: 139
TCO2: 21

## 2011-02-20 LAB — POCT I-STAT CREATININE
Creatinine, Ser: 1.5
Operator id: 265201

## 2011-03-20 ENCOUNTER — Encounter: Payer: Self-pay | Admitting: Family Medicine

## 2011-07-11 ENCOUNTER — Ambulatory Visit (INDEPENDENT_AMBULATORY_CARE_PROVIDER_SITE_OTHER): Payer: Federal, State, Local not specified - PPO | Admitting: Family Medicine

## 2011-07-11 ENCOUNTER — Encounter: Payer: Self-pay | Admitting: Family Medicine

## 2011-07-11 ENCOUNTER — Telehealth: Payer: Self-pay | Admitting: Family Medicine

## 2011-07-11 VITALS — BP 136/84 | HR 58 | Ht 73.0 in | Wt 256.0 lb

## 2011-07-11 DIAGNOSIS — F17200 Nicotine dependence, unspecified, uncomplicated: Secondary | ICD-10-CM | POA: Insufficient documentation

## 2011-07-11 DIAGNOSIS — Z0000 Encounter for general adult medical examination without abnormal findings: Secondary | ICD-10-CM

## 2011-07-11 DIAGNOSIS — I10 Essential (primary) hypertension: Secondary | ICD-10-CM

## 2011-07-11 DIAGNOSIS — F319 Bipolar disorder, unspecified: Secondary | ICD-10-CM | POA: Insufficient documentation

## 2011-07-11 DIAGNOSIS — G40909 Epilepsy, unspecified, not intractable, without status epilepticus: Secondary | ICD-10-CM | POA: Insufficient documentation

## 2011-07-11 DIAGNOSIS — E669 Obesity, unspecified: Secondary | ICD-10-CM

## 2011-07-11 DIAGNOSIS — F4310 Post-traumatic stress disorder, unspecified: Secondary | ICD-10-CM

## 2011-07-11 NOTE — Progress Notes (Signed)
Subjective:    Patient ID: Keith Dixon, male    DOB: [birth year], 51 y.o.   MRN: [id number]  HPI He is here for complete exam. He recently saw his neurologist for consult concerning his seizure disorder. He did have a seizure approximately 2 weeks ago. The bloodwork from the neurology office was reviewed. It did show relatively low Dilantin level. He also has a history of bipolar disorder as well as PTSD and is followed by Dr. Evelene Croon. He has been under a lot of stress recently and has been using marijuana to help with his anxiety. He does complain of left calf pain that bothers him only with increased physical activity. This has been on and off for approximately one year. He also has lesions on his legs he would like me to look at. He does complain of blurred vision. He also complains of dental issues. He admits to drinking Hawaiian Punch approximately 32 ounces a day.   Review of Systems Negative except as above    Objective:   Physical Exam BP 136/84  Pulse 58  Ht 6\' 1"  (1.854 m)  Wt 256 lb (116.121 kg)  BMI 33.78 kg/m2  General Appearance:    Alert, cooperative, no distress, appears stated age  Head:    Normocephalic, without obvious abnormality, atraumatic  Eyes:    PERRL, conjunctiva/corneas clear, EOM's intact, fundi    benign  Ears:    Normal TM's and external ear canals  Nose:   Nares normal, mucosa normal, no drainage or sinus   tenderness  Throat:   Lips, mucosa, and tongue normal; several teeth are missing or carious gums appear normal   Neck:   Supple, no lymphadenopathy;  thyroid:  no   enlargement/tenderness/nodules; no carotid   bruit or JVD  Back:    Spine nontender, no curvature, ROM normal, no CVA     tenderness  Lungs:     Clear to auscultation bilaterally without wheezes, rales or     ronchi; respirations unlabored  Chest Wall:    No tenderness or deformity   Heart:    Regular rate and rhythm, S1 and S2 normal, no murmur, rub   or gallop  Breast Exam:    No chest  wall tenderness, masses or gynecomastia  Abdomen:     Soft, non-tender, nondistended, normoactive bowel sounds,    no masses, no hepatosplenomegaly  Genitalia:   deferred   Rectal:   deferred   Extremities:   No clubbing, cyanosis or edema  Pulses:   2+ and symmetric all extremities  Skin:   Skin color, texture, turgor normal, no rashes.several round pigmented confluent lesions noted on his right calf   Lymph nodes:   Cervical, supraclavicular, and axillary nodes normal  Neurologic:   CNII-XII intact, normal strength, sensation and gait; reflexes 2+ and symmetric throughout          Psych:   Normal mood, affect, hygiene and grooming.           Assessment & Plan:   1. Seizure disorder   2. Bipolar disorder   3. PTSD (post-traumatic stress disorder)   4. Obesity (BMI 30-39.9)   5. Hypertension   6. Current smoker    recommend he discuss medication management with his psychiatrist specifically in regard to his anxiety and use of marijuana. Strongly encouraged him to stop this. Also discussed the need for him to cut back on his soft drink consumption. We also discussed smoking cessation however at this time he  is not interested. He will get followup on some of his medications through the [place] also encouraged him to visit his eye doctor and potentially get reading glasses.

## 2011-07-11 NOTE — Patient Instructions (Signed)
Start to cut back on a Hawaiian punch. At least cut it in half or dilute it. Get your teeth taken care of and your eyes. Start wearing reading glasses. Talked to Dr. Evelene Croon about something more for your anxiety.

## 2011-07-11 NOTE — Telephone Encounter (Signed)
TSD  

## 2012-01-21 ENCOUNTER — Other Ambulatory Visit: Payer: Self-pay | Admitting: Family Medicine

## 2012-05-21 ENCOUNTER — Encounter: Payer: Self-pay | Admitting: Medical

## 2012-05-21 ENCOUNTER — Ambulatory Visit (INDEPENDENT_AMBULATORY_CARE_PROVIDER_SITE_OTHER): Payer: Federal, State, Local not specified - PPO | Admitting: Medical

## 2012-05-21 VITALS — BP 140/80 | HR 65 | Temp 98.3°F | Resp 16 | Wt 249.0 lb

## 2012-05-21 DIAGNOSIS — Z202 Contact with and (suspected) exposure to infections with a predominantly sexual mode of transmission: Secondary | ICD-10-CM

## 2012-05-21 DIAGNOSIS — Z2089 Contact with and (suspected) exposure to other communicable diseases: Secondary | ICD-10-CM

## 2012-05-21 DIAGNOSIS — Z113 Encounter for screening for infections with a predominantly sexual mode of transmission: Secondary | ICD-10-CM

## 2012-05-21 LAB — HEPATITIS B SURFACE ANTIGEN: Hepatitis B Surface Ag: NEGATIVE

## 2012-05-21 NOTE — Progress Notes (Signed)
Subjective: Here for concern of STD.   He notes that he has 2 sexual partners, and one of them said they heard he was HIV + and one of his former partners died of HIV.  He notes no prior HIV + status.  He found out that a former partner of 9 years ago was HIV+.  He notes last STD testing - not sure.  He uses condoms every time with his current 2 partners, but not always in the past.  He denies any current partners with known HIV.  Denies IV drug use.  No prior STD himself.   He normally sees the [place] [facility] for his other medical concerns and medications.     ROS Gen: no fever, chills, wt loss SKin: no rash GU negative, no urinary c/o, no discharge or pain GI negative Back nontender MSK no joint pain or swelling   Objective: Gen: wd, wn, nad, AA male abdomen: +bs, soft, nontender, no mass, no organomegaly GU: normal male external genitalia, circumcised, no rash or lesions, no mass, testicles nontender, no hernia, no inguinal lymphadenopathy   Assessment: Encounter Diagnoses  Name Primary?  . Venereal disease contact Yes  . Screening for STD (sexually transmitted disease)    Plan: discussed prevention, safe sex, will send labs for screening.  F/u pending labs.  [facility]

## 2012-05-22 LAB — HEPATITIS B CORE ANTIBODY, IGM: Hep B C IgM: NEGATIVE

## 2012-05-22 LAB — GC/CHLAMYDIA PROBE AMP, URINE
Chlamydia, Swab/Urine, PCR: NEGATIVE
GC Probe Amp, Urine: NEGATIVE

## 2012-05-22 LAB — HEPATITIS B SURFACE ANTIBODY, QUANTITATIVE: Hep B S AB Quant (Post): 0 m[IU]/mL

## 2012-06-17 ENCOUNTER — Encounter (HOSPITAL_COMMUNITY): Payer: Self-pay | Admitting: *Deleted

## 2012-06-17 ENCOUNTER — Emergency Department (HOSPITAL_COMMUNITY)
Admission: EM | Admit: 2012-06-17 | Discharge: 2012-06-17 | Disposition: A | Discharge status: Home / Self Care | Payer: Federal, State, Local not specified - PPO | Attending: Emergency Medicine | Admitting: Emergency Medicine

## 2012-06-17 DIAGNOSIS — F17200 Nicotine dependence, unspecified, uncomplicated: Secondary | ICD-10-CM | POA: Insufficient documentation

## 2012-06-17 DIAGNOSIS — Z79899 Other long term (current) drug therapy: Secondary | ICD-10-CM | POA: Insufficient documentation

## 2012-06-17 DIAGNOSIS — Z8659 Personal history of other mental and behavioral disorders: Secondary | ICD-10-CM | POA: Insufficient documentation

## 2012-06-17 DIAGNOSIS — R569 Unspecified convulsions: Secondary | ICD-10-CM

## 2012-06-17 DIAGNOSIS — I10 Essential (primary) hypertension: Secondary | ICD-10-CM | POA: Insufficient documentation

## 2012-06-17 LAB — CBC WITH DIFFERENTIAL/PLATELET
Basophils Absolute: 0 10*3/uL (ref 0.0–0.1)
HCT: 43.3 % (ref 39.0–52.0)
Hemoglobin: 15 g/dL (ref 13.0–17.0)
Lymphocytes Relative: 28 % (ref 12–46)
Monocytes Absolute: 0.5 10*3/uL (ref 0.1–1.0)
Monocytes Relative: 7 % (ref 3–12)
Neutro Abs: 4 10*3/uL (ref 1.7–7.7)
Neutrophils Relative %: 63 % (ref 43–77)
WBC: 6.3 10*3/uL (ref 4.0–10.5)

## 2012-06-17 LAB — RAPID URINE DRUG SCREEN, HOSP PERFORMED
Barbiturates: NOT DETECTED
Benzodiazepines: NOT DETECTED
Cocaine: NOT DETECTED
Opiates: NOT DETECTED

## 2012-06-17 LAB — BASIC METABOLIC PANEL
Chloride: 102 mEq/L (ref 96–112)
GFR calc Af Amer: 78 mL/min — ABNORMAL LOW (ref >90)
Potassium: 3.8 mEq/L (ref 3.5–5.1)
Sodium: 138 mEq/L (ref 135–145)

## 2012-06-17 MED ORDER — PHENYTOIN SODIUM EXTENDED 100 MG PO CAPS
300.0000 mg | ORAL_CAPSULE | Freq: Once | ORAL | Status: AC
  Administered 2012-06-17: 300 mg via ORAL
  Filled 2012-06-17: qty 3

## 2012-06-17 MED ORDER — SODIUM CHLORIDE 0.9 % IV BOLUS (SEPSIS)
1000.0000 mL | Freq: Once | INTRAVENOUS | Status: AC
  Administered 2012-06-17: 1000 mL via INTRAVENOUS

## 2012-06-17 NOTE — ED Notes (Signed)
Pt escorted to discharge window. Verbalized understanding discharge instructions. In no acute distress.   

## 2012-06-17 NOTE — ED Provider Notes (Signed)
History     CSN: [id number]  Arrival date & time 06/17/12  1317   First MD Initiated Contact with Patient 06/17/12 1406      Chief Complaint  Patient presents with  . Seizures    (Consider location/radiation/quality/duration/timing/severity/associated sxs/prior treatment) HPI Comments: Pt comes in with cc of seizures. Pt has hx of seizures, on dilantin and lamictal. Pt has been taking his meds. States that he was out at the [place] office, and the next thing he recalls he had nurses helping him. Pt was also incotinent. No recent infections, no n/v/f/c/trauma. Pt admits to smoking marijuana last night. He has been taking all his meds as prescribed.   The history is provided by the patient.    Past Medical History  Diagnosis Date  . Seizures   . Hypertension   . PTSD (post-traumatic stress disorder)   . Bipolar disorder   . Tobacco use disorder     History reviewed. No pertinent past surgical history.  Family History  Problem Relation Age of Onset  . Seizures Mother   . Cancer Mother 48    breast  . Hypertension Brother   . Cancer Maternal Grandmother     History  Substance Use Topics  . Smoking status: Current Every Day Smoker -- 1.0 packs/day for 20 years    Types: Cigarettes  . Smokeless tobacco: Never Used  . Alcohol Use: No      Review of Systems  Constitutional: Negative for fever, chills and activity change.  HENT: Negative for neck pain.   Eyes: Negative for visual disturbance.  Respiratory: Negative for cough, chest tightness and shortness of breath.   Cardiovascular: Negative for chest pain.  Gastrointestinal: Negative for abdominal distention.  Genitourinary: Negative for dysuria, enuresis and difficulty urinating.  Musculoskeletal: Negative for arthralgias.  Neurological: Positive for seizures. Negative for dizziness, light-headedness and headaches.  Psychiatric/Behavioral: Negative for confusion.    Allergies  Review of patient's allergies  indicates no known allergies.  Home Medications   Current Outpatient Rx  Name  Route  Sig  Dispense  Refill  . AMLODIPINE BESYLATE 10 MG PO TABS   Oral   Take 10 mg by mouth daily.           Marland Kitchen LAMOTRIGINE 100 MG PO TABS   Oral   Take 100 mg by mouth 2 (two) times daily.           Marland Kitchen LISINOPRIL-HYDROCHLOROTHIAZIDE 20-12.5 MG PO TABS   Oral   Take 1 tablet by mouth daily.         Marland Kitchen PHENYTOIN SODIUM EXTENDED 100 MG PO CAPS   Oral   Take 200 mg by mouth 2 (two) times daily.            BP 155/83  Pulse 72  Temp 99.2 F (37.3 C) (Oral)  Resp 11  SpO2 100%  Physical Exam  Nursing note and vitals reviewed. Constitutional: He is oriented to person, place, and time. He appears well-developed.  HENT:  Head: Normocephalic and atraumatic.  Eyes: Conjunctivae normal and EOM are normal. Pupils are equal, round, and reactive to light.  Neck: Normal range of motion. Neck supple.  Cardiovascular: Normal rate, regular rhythm and normal heart sounds.   Pulmonary/Chest: Effort normal and breath sounds normal. No respiratory distress. He has no wheezes.  Abdominal: Soft. Bowel sounds are normal. He exhibits no distension. There is no tenderness. There is no rebound and no guarding.  Musculoskeletal: He exhibits no edema.  Neurological:  He is alert and oriented to person, place, and time. No cranial nerve deficit. Coordination normal.  Skin: Skin is warm.    ED Course  Procedures (including critical care time)   Labs Reviewed  BASIC METABOLIC PANEL  CBC WITH DIFFERENTIAL  PHENYTOIN LEVEL, TOTAL  URINE RAPID DRUG SCREEN (HOSP PERFORMED)  ETHANOL  MAGNESIUM   No results found.   No diagnosis found.    MDM  Pt comes in with cc of seizure.  DDx: -Seizure disorder -Meningitis -Trauma -ICH -Electrolyte abnormality -Metabolic derangement -Stroke -Toxin induced seizures -Medication side effects -Hypoxia -Hypoglycemia   Neuro exam is normal. Pt has been  taking his meds as prescribed. We will get basic labs - he has a neurologist with the [facility]. Will check basic labs.  Derwood Kaplan, MD 06/17/12 [phone]

## 2012-06-17 NOTE — ED Notes (Addendum)
Pt came in EMS after a seizure.  Hx of epilepsy.  Pt sts last seizure was "about a month ago."  Sts that he takes his medication as directed.    Pt admits to drinking "one shot" last night.

## 2012-06-17 NOTE — ED Notes (Signed)
Per EMS pt was at the [place] disability office and had a seizure while sitting in the chair. On arrival of EMS pt was up walking around in the bathroom washing his hands but was very confused unable to complete sentences. pts b/p 200/100. Pt in no acute distress, pt alert and disoriented at this time.

## 2012-07-15 ENCOUNTER — Encounter: Payer: Self-pay | Admitting: Family Medicine

## 2012-07-15 ENCOUNTER — Ambulatory Visit (INDEPENDENT_AMBULATORY_CARE_PROVIDER_SITE_OTHER): Payer: Federal, State, Local not specified - PPO | Admitting: Family Medicine

## 2012-07-15 VITALS — BP 136/80 | HR 80 | Wt 247.0 lb

## 2012-07-15 DIAGNOSIS — F319 Bipolar disorder, unspecified: Secondary | ICD-10-CM

## 2012-07-15 DIAGNOSIS — F17200 Nicotine dependence, unspecified, uncomplicated: Secondary | ICD-10-CM

## 2012-07-15 DIAGNOSIS — R634 Abnormal weight loss: Secondary | ICD-10-CM

## 2012-07-15 DIAGNOSIS — G40909 Epilepsy, unspecified, not intractable, without status epilepticus: Secondary | ICD-10-CM

## 2012-07-15 LAB — HEMOCCULT GUIAC POC 1CARD (OFFICE)

## 2012-07-15 NOTE — Progress Notes (Signed)
  Subjective:    Patient ID: Keith Dixon, male    DOB: Feb 15, 1961, 52 y.o.   MRN: [id number]  HPI He is here on followup from recent hospital visit. He did have a seizure and was seen in the emergency room. Apparently since then he has had 6 more seizures and was recently seen by neurology. he also has an underlying bipolar disorder. He also is followed at the [place] . Blood work was taken today. His main concern with me as affected he has had a 20 pound unscheduled weight loss. He has no abdominal pain, nausea, vomiting, diarrhea, early satiety, blood in his stool. He continues to smoke.   Review of Systems     Objective:   Physical Exam Alert and in no distress. Mouth shows several teeth missing otherwise normal. Neck supple without adenopathy. Cardiac exam shows regular rhythm without murmurs or gallops. Lungs are clear to auscultation. Abdominal exam shows no masses, tenderness with normal bowel sounds. Rectal exam shows guaiac negative stool and no evidence of mass.       Assessment & Plan:  Weight loss, non-intentional - Plan: Hemoccult - 1 Card (office), Ambulatory referral to Gastroenterology  Seizure disorder  Bipolar disorder  Smoker

## 2012-07-15 NOTE — Progress Notes (Signed)
Keith Dixon Neuro [phone] to see what labs they drew Dilantin,CBC w/Dif  CMP  [facility] march 7 at 11 am

## 2012-07-16 ENCOUNTER — Ambulatory Visit: Payer: Federal, State, Local not specified - PPO | Admitting: Gastroenterology

## 2012-08-05 ENCOUNTER — Telehealth: Payer: Self-pay | Admitting: *Deleted

## 2012-08-05 MED ORDER — PHENYTOIN 50 MG PO CHEW: 50.0000 mg | CHEWABLE_TABLET | Freq: Every day | ORAL | Status: DC

## 2012-08-05 NOTE — Telephone Encounter (Signed)
Daughter calling because the patient is still having seizures. The patient is taking all his medicines like he is suppose to and on time. The patient has lost 20 pounds and not sure why. She would like to know what were his lab results form his last visit. Please call the daughter.

## 2012-08-05 NOTE — Telephone Encounter (Signed)
His Dilantin level was within a normal range. He can increase dose to 450mg  daily total dose . Will call in to [facility] [address]. The weight lose needs to be evaluated by GI doctor or PCP if he is not trying to lose weight and it concerns the mother.

## 2012-08-05 NOTE — Telephone Encounter (Signed)
Spoke to the daughter and she is aware of lab results and aware of the med increase. She will go and pick up the new Rx today.

## 2012-08-10 ENCOUNTER — Encounter: Payer: Self-pay | Admitting: Gastroenterology

## 2012-08-10 ENCOUNTER — Ambulatory Visit (INDEPENDENT_AMBULATORY_CARE_PROVIDER_SITE_OTHER)
Admission: RE | Admit: 2012-08-10 | Discharge: 2012-08-10 | Disposition: A | Discharge status: Home / Self Care | Payer: Federal, State, Local not specified - PPO | Source: Ambulatory Visit | Attending: Gastroenterology | Admitting: Gastroenterology

## 2012-08-10 ENCOUNTER — Other Ambulatory Visit: Payer: Federal, State, Local not specified - PPO

## 2012-08-10 ENCOUNTER — Ambulatory Visit (INDEPENDENT_AMBULATORY_CARE_PROVIDER_SITE_OTHER): Payer: Federal, State, Local not specified - PPO | Admitting: Gastroenterology

## 2012-08-10 VITALS — BP 156/80 | HR 76 | Ht 72.5 in | Wt 252.4 lb

## 2012-08-10 DIAGNOSIS — Z1211 Encounter for screening for malignant neoplasm of colon: Secondary | ICD-10-CM

## 2012-08-10 DIAGNOSIS — R634 Abnormal weight loss: Secondary | ICD-10-CM

## 2012-08-10 MED ORDER — MOVIPREP 100 G PO SOLR
1.0000 | Freq: Once | ORAL | Status: DC
Start: 2012-08-10 — End: 2012-09-10

## 2012-08-10 NOTE — Patient Instructions (Addendum)
One of your biggest health concerns is your smoking.  This increases your risk for most cancers and serious cardiovascular diseases such as strokes, heart attacks.  You should try your best to stop.  If you need assistance, please contact your PCP or Smoking Cessation Class at [facility] [phone]) or [facility] Quit-Line (1-800-QUIT-NOW). Chest xray (PA and lateral) for weight loss. You will be set up for a colonoscopy for weight loss, cancer screening (LEC, MAC sedation) You will have labs checked today in the basement lab.  Please head down after you check out with the front desk  (celiac sprue panel).                                              We are excited to introduce MyChart, a new best-in-class service that provides you online access to important information in your electronic medical record. We want to make it easier for you to view your health information - all in one secure location - when and where you need it. We expect MyChart will enhance the quality of care and service we provide.  When you register for MyChart, you can:    View your test results.    Request appointments and receive appointment reminders via email.    Request medication renewals.    View your medical history, allergies, medications and immunizations.    Communicate with your physician's office through a password-protected site.    Conveniently print information such as your medication lists.  To find out if MyChart is right for you, please talk to a member of our clinical staff today. We will gladly answer your questions about this free health and wellness tool.  If you are age 50 or older and want a member of your family to have access to your record, you must provide written consent by completing a proxy form available at our office. Please speak to our clinical staff about guidelines regarding accounts for patients younger than age 3.  As you activate your MyChart account and need any  technical assistance, please call the MyChart technical support line at (336) 83-CHART [phone]) or email your question to [email or web address]. If you email your question(s), please include your name, a return phone number and the best time to reach you.  If you have non-urgent health-related questions, you can send a message to our office through MyChart at [email or web address]. If you have a medical emergency, call 911.  Thank you for using MyChart as your new health and wellness resource!   MyChart licensed from [employer],  [id number]. Patents Pending.
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
## 2012-08-11 LAB — CELIAC PANEL 10
Endomysial Screen: NEGATIVE
Gliadin IgA: 21.4 U/mL — ABNORMAL HIGH (ref <20)
Gliadin IgG: 7.1 U/mL (ref <20)
IgA: 112 mg/dL (ref 68–379)

## 2012-09-10 ENCOUNTER — Encounter: Payer: Self-pay | Admitting: Gastroenterology

## 2012-09-10 ENCOUNTER — Ambulatory Visit (AMBULATORY_SURGERY_CENTER): Payer: Federal, State, Local not specified - PPO | Admitting: Gastroenterology

## 2012-09-10 VITALS — BP 137/86 | HR 69 | Temp 97.6°F | Resp 18 | Ht 73.0 in | Wt 252.0 lb

## 2012-09-10 DIAGNOSIS — D126 Benign neoplasm of colon, unspecified: Secondary | ICD-10-CM

## 2012-09-10 DIAGNOSIS — K5900 Constipation, unspecified: Secondary | ICD-10-CM

## 2012-09-10 DIAGNOSIS — R634 Abnormal weight loss: Secondary | ICD-10-CM

## 2012-09-10 DIAGNOSIS — Z1211 Encounter for screening for malignant neoplasm of colon: Secondary | ICD-10-CM

## 2012-09-10 MED ORDER — SODIUM CHLORIDE 0.9 % IV SOLN: 500.0000 mL | INTRAVENOUS | Status: DC

## 2012-09-10 NOTE — Progress Notes (Signed)
Lidocaine-40mg IV prior to Propofol InductionPropofol given over incremental dosages 

## 2012-09-10 NOTE — Progress Notes (Signed)
Called to room to assist during endoscopic procedure.  Patient ID and intended procedure confirmed with present staff. Received instructions for my participation in the procedure from the performing physician.  

## 2012-09-10 NOTE — Op Note (Signed)
[facility] [address] [place], [address]   COLONOSCOPY PROCEDURE REPORT  PATIENT: Keith Dixon, Keith Dixon.  MR#: [id number] BIRTHDATE: 1960-11-05 , 51  yrs. old GENDER: Male ENDOSCOPIST: Rachael Fee, MD REFERRED QI:ONGE Susann Givens, M.D. PROCEDURE DATE:  09/10/2012 PROCEDURE:   Colonoscopy with snare polypectomy ASA CLASS:   Class II INDICATIONS:constipation. MEDICATIONS: MAC sedation, administered by CRNA and propofol (Diprivan) 250mg  IV  DESCRIPTION OF PROCEDURE:   After the risks benefits and alternatives of the procedure were thoroughly explained, informed consent was obtained.  A digital rectal exam revealed no abnormalities of the rectum.   The LB [id number]  endoscope was introduced through the anus and advanced to the cecum, which was identified by both the appendix and ileocecal valve. No adverse events experienced.   The quality of the prep was good.  The instrument was then slowly withdrawn as the colon was fully examined.  COLON FINDINGS: One polyp was found, removed and sent to pathology. This was 4mm across, sessile, located in sigmoid colon, removed with cold snare.  The examination was otherwise normal. Retroflexed views revealed no abnormalities. The time to cecum=2 minutes 03 seconds.  Withdrawal time=11 minutes 26 seconds.  The scope was withdrawn and the procedure completed. COMPLICATIONS: There were no complications.  ENDOSCOPIC IMPRESSION: One polyp was found, removed and sent to pathology. The examination was otherwise normal.  RECOMMENDATIONS: If the polyp(s) removed today are proven to be adenomatous (pre-cancerous) polyps, you will need a repeat colonoscopy in 5 years.  Otherwise you should continue to follow colorectal cancer screening guidelines for "routine risk" patients with colonoscopy in 10 years.  You will receive a letter within 1-2 weeks with the results of your biopsy as well as final recommendations.   Please call my office if you have not received a letter after 3 weeks.   eSigned:  Rachael Fee, MD 09/10/2012 10:00 AM

## 2012-09-10 NOTE — Progress Notes (Signed)
Patient did not experience any of the following events: a burn prior to discharge; a fall within the facility; wrong site/side/patient/procedure/implant event; or a hospital transfer or hospital admission upon discharge from the facility. (G8907) Patient did not have preoperative order for IV antibiotic SSI prophylaxis. (G8918)  

## 2012-09-10 NOTE — Patient Instructions (Addendum)
YOU HAD AN ENDOSCOPIC PROCEDURE TODAY AT THE [facility]: Refer to the procedure report that was given to you for any specific questions about what was found during the examination.  If the procedure report does not answer your questions, please call your gastroenterologist to clarify.  If you requested that your care partner not be given the details of your procedure findings, then the procedure report has been included in a sealed envelope for you to review at your convenience later.  YOU SHOULD EXPECT: Some feelings of bloating in the abdomen. Passage of more gas than usual.  Walking can help get rid of the air that was put into your GI tract during the procedure and reduce the bloating. If you had a lower endoscopy (such as a colonoscopy or flexible sigmoidoscopy) you may notice spotting of blood in your stool or on the toilet paper. If you underwent a bowel prep for your procedure, then you may not have a normal bowel movement for a few days.  DIET: Your first meal following the procedure should be a light meal and then it is ok to progress to your normal diet.  A half-sandwich or bowl of soup is an example of a good first meal.  Heavy or fried foods are harder to digest and may make you feel nauseous or bloated.  Likewise meals heavy in dairy and vegetables can cause extra gas to form and this can also increase the bloating.  Drink plenty of fluids but you should avoid alcoholic beverages for 24 hours.  ACTIVITY: Your care partner should take you home directly after the procedure.  You should plan to take it easy, moving slowly for the rest of the day.  You can resume normal activity the day after the procedure however you should NOT DRIVE or use heavy machinery for 24 hours (because of the sedation medicines used during the test).    SYMPTOMS TO REPORT IMMEDIATELY: A gastroenterologist can be reached at any hour.  During normal business hours, 8:30 AM to 5:00 PM Monday through Friday,  call [phone].  After hours and on weekends, please call the GI answering service at [phone] who will take a message and have the physician on call contact you.   Following lower endoscopy (colonoscopy or flexible sigmoidoscopy):  Excessive amounts of blood in the stool  Significant tenderness or worsening of abdominal pains  Swelling of the abdomen that is new, acute  Fever of 100F or higher FOLLOW UP: If any biopsies were taken you will be contacted by phone or by letter within the next 1-3 weeks.  Call your gastroenterologist if you have not heard about the biopsies in 3 weeks.  Our staff will call the home number listed on your records the next business day following your procedure to check on you and address any questions or concerns that you may have at that time regarding the information given to you following your procedure. This is a courtesy call and so if there is no answer at the home number and we have not heard from you through the emergency physician on call, we will assume that you have returned to your regular daily activities without incident.  SIGNATURES/CONFIDENTIALITY: You and/or your care partner have signed paperwork which will be entered into your electronic medical record.  These signatures attest to the fact that that the information above on your After Visit Summary has been reviewed and is understood.  Full responsibility of the confidentiality of this discharge   information lies with you and/or your care-partner.  Polyp information given. 

## 2012-09-13 ENCOUNTER — Telehealth: Payer: Self-pay | Admitting: *Deleted

## 2012-09-13 NOTE — Telephone Encounter (Signed)
  Follow up Call-  Call back number 09/10/2012  Post procedure Call Back phone  # [phone]  Permission to leave phone message Yes     Patient questions:  Do you have a fever, pain , or abdominal swelling? no Pain Score  0 *  Have you tolerated food without any problems? yes  Have you been able to return to your normal activities? yes  Do you have any questions about your discharge instructions: Diet   no Medications  no Follow up visit  no  Do you have questions or concerns about your Care? no  Actions: * If pain score is 4 or above: No action needed, pain <4.

## 2012-09-17 ENCOUNTER — Encounter: Payer: Self-pay | Admitting: Gastroenterology

## 2012-09-29 ENCOUNTER — Ambulatory Visit: Payer: Self-pay | Admitting: Nurse Practitioner

## 2012-10-11 ENCOUNTER — Other Ambulatory Visit: Payer: Self-pay | Admitting: Family Medicine

## 2012-11-14 ENCOUNTER — Emergency Department (HOSPITAL_COMMUNITY): Payer: Federal, State, Local not specified - PPO

## 2012-11-14 ENCOUNTER — Encounter (HOSPITAL_COMMUNITY): Payer: Self-pay | Admitting: *Deleted

## 2012-11-14 ENCOUNTER — Inpatient Hospital Stay (HOSPITAL_COMMUNITY)
Admission: EM | Admit: 2012-11-14 | Discharge: 2012-11-16 | DRG: 416 | Disposition: A | Discharge status: Home / Self Care | Payer: Federal, State, Local not specified - PPO | Attending: Internal Medicine | Admitting: Internal Medicine

## 2012-11-14 DIAGNOSIS — F1210 Cannabis abuse, uncomplicated: Secondary | ICD-10-CM | POA: Diagnosis present

## 2012-11-14 DIAGNOSIS — R569 Unspecified convulsions: Secondary | ICD-10-CM

## 2012-11-14 DIAGNOSIS — G40909 Epilepsy, unspecified, not intractable, without status epilepticus: Secondary | ICD-10-CM | POA: Diagnosis present

## 2012-11-14 DIAGNOSIS — B9789 Other viral agents as the cause of diseases classified elsewhere: Secondary | ICD-10-CM | POA: Diagnosis present

## 2012-11-14 DIAGNOSIS — F4310 Post-traumatic stress disorder, unspecified: Secondary | ICD-10-CM | POA: Diagnosis present

## 2012-11-14 DIAGNOSIS — A419 Sepsis, unspecified organism: Principal | ICD-10-CM | POA: Diagnosis present

## 2012-11-14 DIAGNOSIS — I10 Essential (primary) hypertension: Secondary | ICD-10-CM | POA: Diagnosis present

## 2012-11-14 DIAGNOSIS — I4460 Unspecified fascicular block: Secondary | ICD-10-CM | POA: Diagnosis present

## 2012-11-14 DIAGNOSIS — N390 Urinary tract infection, site not specified: Secondary | ICD-10-CM | POA: Diagnosis present

## 2012-11-14 DIAGNOSIS — F17200 Nicotine dependence, unspecified, uncomplicated: Secondary | ICD-10-CM | POA: Diagnosis present

## 2012-11-14 DIAGNOSIS — I444 Left anterior fascicular block: Secondary | ICD-10-CM | POA: Diagnosis present

## 2012-11-14 DIAGNOSIS — F411 Generalized anxiety disorder: Secondary | ICD-10-CM | POA: Diagnosis present

## 2012-11-14 DIAGNOSIS — Z79899 Other long term (current) drug therapy: Secondary | ICD-10-CM

## 2012-11-14 DIAGNOSIS — F319 Bipolar disorder, unspecified: Secondary | ICD-10-CM | POA: Diagnosis present

## 2012-11-14 DIAGNOSIS — R509 Fever, unspecified: Secondary | ICD-10-CM | POA: Diagnosis present

## 2012-11-14 LAB — URINE MICROSCOPIC-ADD ON

## 2012-11-14 LAB — URINALYSIS, ROUTINE W REFLEX MICROSCOPIC
Bilirubin Urine: NEGATIVE
Glucose, UA: NEGATIVE mg/dL
Specific Gravity, Urine: 1.022 (ref 1.005–1.030)
Urobilinogen, UA: 1 mg/dL (ref 0.0–1.0)
pH: 7.5 (ref 5.0–8.0)

## 2012-11-14 LAB — POCT I-STAT, CHEM 8
BUN: 18 mg/dL (ref 6–23)
Chloride: 101 mEq/L (ref 96–112)
Creatinine, Ser: 1.3 mg/dL (ref 0.50–1.35)
Glucose, Bld: 114 mg/dL — ABNORMAL HIGH (ref 70–99)
Hemoglobin: 14.6 g/dL (ref 13.0–17.0)
Potassium: 3.6 mEq/L (ref 3.5–5.1)
Sodium: 137 mEq/L (ref 135–145)

## 2012-11-14 LAB — CBC WITH DIFFERENTIAL/PLATELET
Eosinophils Absolute: 0.1 10*3/uL (ref 0.0–0.7)
Eosinophils Relative: 1 % (ref 0–5)
HCT: 38.8 % — ABNORMAL LOW (ref 39.0–52.0)
Lymphocytes Relative: 9 % — ABNORMAL LOW (ref 12–46)
Lymphs Abs: 0.7 10*3/uL (ref 0.7–4.0)
MCH: 28.5 pg (ref 26.0–34.0)
MCV: 83.1 fL (ref 78.0–100.0)
Monocytes Absolute: 0.4 10*3/uL (ref 0.1–1.0)
Platelets: 146 10*3/uL — ABNORMAL LOW (ref 150–400)
RBC: 4.67 MIL/uL (ref 4.22–5.81)

## 2012-11-14 LAB — PHENYTOIN LEVEL, TOTAL: Phenytoin Lvl: 11.2 ug/mL (ref 10.0–20.0)

## 2012-11-14 MED ORDER — ACETAMINOPHEN 650 MG RE SUPP
650.0000 mg | Freq: Once | RECTAL | Status: AC
  Administered 2012-11-14: 650 mg via RECTAL
  Filled 2012-11-14: qty 1

## 2012-11-14 MED ORDER — LORAZEPAM 2 MG/ML IJ SOLN: 1.0000 mg | Freq: Once | INTRAMUSCULAR | Status: AC

## 2012-11-14 MED ORDER — SODIUM CHLORIDE 0.9 % IV SOLN
500.0000 mg | Freq: Once | INTRAVENOUS | Status: AC
  Administered 2012-11-15: 500 mg via INTRAVENOUS
  Filled 2012-11-14: qty 5

## 2012-11-14 MED ORDER — DEXTROSE 5 % IV SOLN
1.0000 g | INTRAVENOUS | Status: DC
  Administered 2012-11-14 – 2012-11-15 (×2): 1 g via INTRAVENOUS
  Filled 2012-11-14 (×4): qty 10

## 2012-11-14 MED ORDER — LORAZEPAM 2 MG/ML IJ SOLN
1.0000 mg | Freq: Once | INTRAMUSCULAR | Status: AC
  Administered 2012-11-14: 1 mg via INTRAVENOUS
  Filled 2012-11-14: qty 1

## 2012-11-14 MED ORDER — SODIUM CHLORIDE 0.9 % IV SOLN
Freq: Once | INTRAVENOUS | Status: AC
  Administered 2012-11-14: 23:00:00 via INTRAVENOUS

## 2012-11-14 MED ORDER — LORAZEPAM 2 MG/ML IJ SOLN
INTRAMUSCULAR | Status: AC
  Administered 2012-11-14: 1 mg via INTRAVENOUS
  Filled 2012-11-14: qty 1

## 2012-11-14 NOTE — ED Notes (Signed)
Per PTAR report: pt from home: Pt's significant other reports pt had a "grand-mal" seizure that lasted for "several minutes."  Pt hx of seizure and was up until 2 weeks ago was having frequent seizures until medications changed.  Tonight's seizure was the first since medication change.  On PTAR arrival, pt had urinated on himself and was post-ictal.  Pt was unable to answer any of PTAR's questions.  On arrival to ED, pt able to ambulate from stretcher to bed.  Pt alert and oriented to himself, place, and situation. No tongue injury noted.

## 2012-11-14 NOTE — ED Notes (Signed)
Bed:WA13<BR> Expected date:<BR> Expected time:<BR> Means of arrival:<BR> Comments:<BR> EMS

## 2012-11-14 NOTE — ED Provider Notes (Signed)
History    CSN: [id number] Arrival date & time 11/14/12  2115  First MD Initiated Contact with Patient 11/14/12 2123     Chief Complaint  Patient presents with  . Seizures   (Consider location/radiation/quality/duration/timing/severity/associated sxs/prior Treatment) HPI Comments: Patient with Hx seizures recently had medications increased and this is the first seizure since than.  Has been more fatigued this week with tactile fever today.  Tonight's seizure lasted 15 minutes  Started with lip smacking, followed by somnolence and disorientation. On arrival to ED was at baseline   Patient is a 52 y.o. male presenting with seizures. The history is provided by the patient.  Seizures Seizure activity on arrival: no   Seizure type:  Grand mal Preceding symptoms: aura   Preceding symptoms comment:  Lip smacling Initial focality:  Diffuse Episode characteristics: generalized shaking and incontinence   Postictal symptoms: somnolence   Return to baseline: yes   Severity:  Severe Duration:  15 minutes Timing:  Once Number of seizures this episode:  1 Progression:  Unchanged Context: change in medication and fever   Context: medical compliance   PTA treatment:  None History of seizures: yes    Past Medical History  Diagnosis Date  . Seizures   . Hypertension   . PTSD (post-traumatic stress disorder)   . Bipolar disorder   . Tobacco use disorder   . Anxiety   . Depression    History reviewed. No pertinent past surgical history. Family History  Problem Relation Age of Onset  . Seizures Mother   . Breast cancer Mother 105  . Hypertension Brother    History  Substance Use Topics  . Smoking status: Current Every Day Smoker -- 1.00 packs/day for 20 years    Types: Cigarettes  . Smokeless tobacco: Never Used  . Alcohol Use: No     Comment: once or twice a year    Review of Systems  Constitutional: Positive for fever and chills.  Respiratory: Negative for cough and  shortness of breath.   Cardiovascular: Negative for chest pain.  Gastrointestinal: Negative for nausea, vomiting and abdominal pain.  Neurological: Positive for seizures and headaches. Negative for dizziness and weakness.  All other systems reviewed and are negative.    Allergies  Review of patient's allergies indicates no known allergies.  Home Medications   Current Outpatient Rx  Name  Route  Sig  Dispense  Refill  . amLODipine (NORVASC) 10 MG tablet      TAKE 1 TABLET BY MOUTH EVERY DAY   30 tablet   PRN     CUSTOMER IS COMPLETELY OUT   . Cholecalciferol 2000 UNITS CAPS   Oral   Take 1 capsule by mouth 2 (two) times daily.         . citalopram (CELEXA) 40 MG tablet   Oral   Take 20 mg by mouth daily. Takes one-half of 40mg  tablet to equal 20mg          . lamoTRIgine (LAMICTAL) 100 MG tablet   Oral   Take 200-300 mg by mouth 2 (two) times daily. 200mg  in am   And 300mg  at night         . lisinopril-hydrochlorothiazide (PRINZIDE,ZESTORETIC) 20-12.5 MG per tablet   Oral   Take 1 tablet by mouth daily.         . methocarbamol (ROBAXIN) 500 MG tablet   Oral   Take 500 mg by mouth 4 (four) times daily.         Marland Kitchen  naproxen (NAPROSYN) 500 MG tablet   Oral   Take 500 mg by mouth 2 (two) times daily with a meal.         . phenytoin (DILANTIN) 100 MG ER capsule   Oral   Take 200 mg by mouth 2 (two) times daily.          . phenytoin (DILANTIN) 50 MG tablet   Oral   Chew 50 mg by mouth at bedtime. Chew one tablet by mouth at bedtime  Take with the phenytoin 100mg  capsules at night for a total dose of 200mg  in the morning and 250mg  at bedtime          BP 141/62  Pulse 94  Temp(Src) 103.7 F (39.8 C) (Rectal)  Resp 16  Ht 6\' 1"  (1.854 m)  Wt 265 lb (120.203 kg)  BMI 34.97 kg/m2  SpO2 97% Physical Exam  Nursing note and vitals reviewed. Constitutional: He is oriented to person, place, and time. He appears well-developed and well-nourished.  Eyes:  Pupils are equal, round, and reactive to light.  Cardiovascular: Normal rate and regular rhythm.   Pulmonary/Chest: Effort normal and breath sounds normal.  Musculoskeletal: Normal range of motion.  Neurological: He is alert and oriented to person, place, and time.  Skin: Skin is warm. No rash noted.    ED Course  Procedures (including critical care time) Labs Reviewed  GLUCOSE, CAPILLARY - Abnormal; Notable for the following:    Glucose-Capillary 111 (*)    All other components within normal limits  CBC WITH DIFFERENTIAL - Abnormal; Notable for the following:    HCT 38.8 (*)    Platelets 146 (*)    Neutrophils Relative % 85 (*)    Lymphocytes Relative 9 (*)    All other components within normal limits  URINALYSIS, ROUTINE W REFLEX MICROSCOPIC - Abnormal; Notable for the following:    APPearance CLOUDY (*)    Hgb urine dipstick SMALL (*)    Leukocytes, UA MODERATE (*)    All other components within normal limits  URINE MICROSCOPIC-ADD ON - Abnormal; Notable for the following:    Bacteria, UA MANY (*)    All other components within normal limits  POCT I-STAT, CHEM 8 - Abnormal; Notable for the following:    Glucose, Bld 114 (*)    All other components within normal limits  URINE CULTURE  CULTURE, BLOOD (ROUTINE X 2)  CULTURE, BLOOD (ROUTINE X 2)  PHENYTOIN LEVEL, TOTAL  LEVETIRACETAM LEVEL  LACTIC ACID, PLASMA   Dg Chest 2 View  11/14/2012   *RADIOLOGY REPORT*  Clinical Data: .Fever  CHEST - 2 VIEW  Comparison: 08/10/2012  Findings: Slight peribronchial thickening. Heart and mediastinal contours are within normal limits.  No focal opacities or effusions.  No acute bony abnormality.  IMPRESSION: Slight bronchitic changes.   Original Report Authenticated By: Charlett Nose, M.D.   Ct Head Wo Contrast  11/14/2012   *RADIOLOGY REPORT*  Clinical Data: Seizure.  CT HEAD WITHOUT CONTRAST  Technique:  Contiguous axial images were obtained from the base of the skull through the vertex  without contrast.  Comparison: 12/31/2009  Findings: No acute intracranial abnormality.  Specifically, no hemorrhage, hydrocephalus, mass lesion, acute infarction, or significant intracranial injury.  No acute calvarial abnormality. Visualized paranasal sinuses and mastoids clear.  Orbital soft tissues unremarkable.  IMPRESSION: No acute intracranial abnormality.   Original Report Authenticated By: Charlett Nose, M.D.   1. Seizure   2. UTI (lower urinary tract infection)  MDM  Spoke with Dr. Irena Reichmann neurologist  No need for LP tape at this time    Arman Filter, NP 11/15/12 0031

## 2012-11-15 ENCOUNTER — Encounter (HOSPITAL_COMMUNITY): Payer: Self-pay | Admitting: *Deleted

## 2012-11-15 DIAGNOSIS — I444 Left anterior fascicular block: Secondary | ICD-10-CM | POA: Diagnosis present

## 2012-11-15 DIAGNOSIS — R509 Fever, unspecified: Secondary | ICD-10-CM | POA: Diagnosis present

## 2012-11-15 DIAGNOSIS — G40909 Epilepsy, unspecified, not intractable, without status epilepticus: Secondary | ICD-10-CM

## 2012-11-15 DIAGNOSIS — R569 Unspecified convulsions: Secondary | ICD-10-CM

## 2012-11-15 DIAGNOSIS — A419 Sepsis, unspecified organism: Secondary | ICD-10-CM | POA: Diagnosis present

## 2012-11-15 DIAGNOSIS — F4310 Post-traumatic stress disorder, unspecified: Secondary | ICD-10-CM

## 2012-11-15 DIAGNOSIS — N390 Urinary tract infection, site not specified: Secondary | ICD-10-CM | POA: Diagnosis present

## 2012-11-15 LAB — MRSA PCR SCREENING: MRSA by PCR: NEGATIVE

## 2012-11-15 LAB — TROPONIN I
Troponin I: <0.3 ng/mL (ref <0.30)
Troponin I: <0.3 ng/mL (ref <0.30)
Troponin I: <0.3 ng/mL (ref <0.30)

## 2012-11-15 MED ORDER — PHENYTOIN 50 MG PO CHEW
50.0000 mg | CHEWABLE_TABLET | Freq: Every day | ORAL | Status: DC
  Administered 2012-11-15 (×2): 50 mg via ORAL
  Filled 2012-11-15 (×3): qty 1

## 2012-11-15 MED ORDER — METHOCARBAMOL 500 MG PO TABS: 500.0000 mg | ORAL_TABLET | Freq: Four times a day (QID) | ORAL | Status: DC | PRN

## 2012-11-15 MED ORDER — LORAZEPAM 2 MG/ML IJ SOLN: 1.0000 mg | Freq: Four times a day (QID) | INTRAMUSCULAR | Status: DC | PRN

## 2012-11-15 MED ORDER — AMLODIPINE BESYLATE 10 MG PO TABS
10.0000 mg | ORAL_TABLET | Freq: Every day | ORAL | Status: DC
  Administered 2012-11-15 – 2012-11-16 (×2): 10 mg via ORAL
  Filled 2012-11-15 (×2): qty 1

## 2012-11-15 MED ORDER — LISINOPRIL 20 MG PO TABS
20.0000 mg | ORAL_TABLET | Freq: Every day | ORAL | Status: DC
  Administered 2012-11-15 – 2012-11-16 (×2): 20 mg via ORAL
  Filled 2012-11-15 (×2): qty 1

## 2012-11-15 MED ORDER — PHENYTOIN SODIUM EXTENDED 100 MG PO CAPS
200.0000 mg | ORAL_CAPSULE | Freq: Two times a day (BID) | ORAL | Status: DC
  Administered 2012-11-15 – 2012-11-16 (×4): 200 mg via ORAL
  Filled 2012-11-15 (×5): qty 2

## 2012-11-15 MED ORDER — LAMOTRIGINE 200 MG PO TABS: 200.0000 mg | ORAL_TABLET | Freq: Two times a day (BID) | ORAL | Status: DC

## 2012-11-15 MED ORDER — LISINOPRIL-HYDROCHLOROTHIAZIDE 20-12.5 MG PO TABS: 1.0000 | ORAL_TABLET | Freq: Every day | ORAL | Status: DC

## 2012-11-15 MED ORDER — CITALOPRAM HYDROBROMIDE 20 MG PO TABS
20.0000 mg | ORAL_TABLET | Freq: Every day | ORAL | Status: DC
  Administered 2012-11-15 – 2012-11-16 (×2): 20 mg via ORAL
  Filled 2012-11-15 (×2): qty 1

## 2012-11-15 MED ORDER — HYDROCHLOROTHIAZIDE 12.5 MG PO CAPS
12.5000 mg | ORAL_CAPSULE | Freq: Every day | ORAL | Status: DC
  Administered 2012-11-15 – 2012-11-16 (×2): 12.5 mg via ORAL
  Filled 2012-11-15 (×2): qty 1

## 2012-11-15 MED ORDER — PHENYTOIN 50 MG PO CHEW: 50.0000 mg | CHEWABLE_TABLET | Freq: Every day | ORAL | Status: DC

## 2012-11-15 MED ORDER — BIOTENE DRY MOUTH MT LIQD
15.0000 mL | Freq: Two times a day (BID) | OROMUCOSAL | Status: DC
  Administered 2012-11-15 – 2012-11-16 (×3): 15 mL via OROMUCOSAL

## 2012-11-15 MED ORDER — DEXTROSE 5 % IV SOLN: 1.0000 g | INTRAVENOUS | Status: DC

## 2012-11-15 MED ORDER — NAPROXEN 500 MG PO TABS
500.0000 mg | ORAL_TABLET | Freq: Two times a day (BID) | ORAL | Status: DC
Start: 2012-11-15 — End: 2012-11-16
  Administered 2012-11-15 – 2012-11-16 (×2): 500 mg via ORAL
  Filled 2012-11-15 (×5): qty 1

## 2012-11-15 MED ORDER — VITAMIN D3 25 MCG (1000 UNIT) PO TABS
2000.0000 [IU] | ORAL_TABLET | Freq: Two times a day (BID) | ORAL | Status: DC
  Administered 2012-11-15 – 2012-11-16 (×3): 2000 [IU] via ORAL
  Filled 2012-11-15 (×4): qty 2

## 2012-11-15 MED ORDER — LAMOTRIGINE 150 MG PO TABS
300.0000 mg | ORAL_TABLET | Freq: Every day | ORAL | Status: DC
  Administered 2012-11-15: 300 mg via ORAL
  Filled 2012-11-15 (×2): qty 2

## 2012-11-15 MED ORDER — LAMOTRIGINE 200 MG PO TABS
200.0000 mg | ORAL_TABLET | Freq: Every day | ORAL | Status: DC
  Administered 2012-11-15 – 2012-11-16 (×2): 200 mg via ORAL
  Filled 2012-11-15 (×2): qty 1

## 2012-11-15 MED ORDER — SODIUM CHLORIDE 0.9 % IV SOLN
INTRAVENOUS | Status: AC
  Administered 2012-11-15: 06:00:00 via INTRAVENOUS

## 2012-11-15 NOTE — Progress Notes (Signed)
Patient does not have a computer-declined activation of Mychart at this time

## 2012-11-15 NOTE — ED Notes (Signed)
Dr. David at bedside. 

## 2012-11-15 NOTE — Care Management Note (Addendum)
    Page 1 of 1   11/16/2012     2:26:51 PM   CARE MANAGEMENT NOTE 11/16/2012  Patient:  Keith Dixon, Keith Dixon   Account Number:  [id number]  Date Initiated:  11/15/2012  Documentation initiated by:  Colleen Can  Subjective/Objective Assessment:   dx seizure disorder, fever     Action/Plan:   From Home   Anticipated DC Date:  11/16/2012   Anticipated DC Plan:  HOME/SELF CARE      DC Planning Services  CM consult      Choice offered to / List presented to:             Status of service:  Completed, signed off Medicare Important Message given?   (If response is "NO", the following Medicare IM given date fields will be blank) Date Medicare IM given:   Date Additional Medicare IM given:    Discharge Disposition:  HOME/SELF CARE  Per UR Regulation:  Reviewed for med. necessity/level of care/duration of stay  If discussed at Long Length of Stay Meetings, dates discussed:    Comments:  11/16/12 [facility] RN,BSN NCM [phone] D/C HOME NO NEEDS.  11/15/2012 Colleen Can BSN RN CCM [phone]

## 2012-11-15 NOTE — Progress Notes (Signed)
[facility] PROGRESS NOTE  Keith Dixon ZOX:[id number] DOB: [birth year] DOA: 11/14/2012 PCP: Carollee Herter, MD  Brief narrative: Keith Dixon is an 52 y.o. male with a PMH of seizure disorder, PTSD, hypertension and bipolar disorder who was admitted on 11/15/2012 with breakthrough seizures in the setting of fever of 102.  Assessment/Plan: Principal Problem:   Seizure disorder -Managed with Lamictal and Dilantin as an outpatient. -Dilantin level therapeutic at 11.2.  Levetiracetam level pending. -May have been triggered by fever. -Monitored on the step down unit overnight. Active Problems:   Left anterior fascicular block -No old EKG for comparison. -Cycling troponins. -1st troponin negative.   PTSD (post-traumatic stress disorder) -Continue Celexa.   UTI (lower urinary tract infection) -Continue empiric Rocephin. Followup urine culture results.   Fever / sepsis -Patient meets criteria for sepsis with fever, elevated HR and RR. -Source thought to be from UTI. -BP stable. -On empric Rocephin. -Chest x-ray showed bronchitic changes but no pneumonia. -Followup blood cultures. -Lactic acid 1.9.   Hypertension -Blood pressure medications on hold.  Resume.  Code Status: Full. Family Communication: Family at bedside. Disposition Plan: Home when stable.   Medical Consultants:  None.  Other Consultants:  None.  Anti-infectives:  Rocephin 11/14/2012--->  HPI/Subjective: Keith Dixon has not had any further seizure events while in the hospital.  No or vomiting, no diarrhea. No dyspnea or cough.  Still febrile.  Objective: Filed Vitals:   11/15/12 0300 11/15/12 0400 11/15/12 0440 11/15/12 0500  BP: 133/80 153/67  122/52  Pulse: 94 91 96 95  Temp:  99.7 F (37.6 C) 101.1 F (38.4 C)   TempSrc:  Oral Oral   Resp: 25 21 27 20   Height:      Weight:      SpO2: 98% 100% 97% 97%    Intake/Output Summary (Last 24 hours) at 11/15/12 0711 Last data  filed at 11/15/12 8119  Gross per 24 hour  Intake  312.5 ml  Output    675 ml  Net -362.5 ml    Exam: Gen:  NAD Cardiovascular:  RRR, No M/R/G Respiratory:  Lungs CTAB Gastrointestinal:  Abdomen soft, NT/ND, + BS Extremities:  No C/E/C  Data Reviewed: Basic Metabolic Panel:  Recent Labs Lab 11/14/12 2156  NA 137  K 3.6  CL 101  GLUCOSE 114*  BUN 18  CREATININE 1.30   GFR Estimated Creatinine Clearance: 90.8 ml/min (by C-G formula based on Cr of 1.3).  CBC:  Recent Labs Lab 11/14/12 2149 11/14/12 2156  WBC 8.2  --   NEUTROABS 7.0  --   HGB 13.3 14.6  HCT 38.8* 43.0  MCV 83.1  --   PLT 146*  --    Cardiac Enzymes:  Recent Labs Lab 11/15/12 0527  TROPONINI <0.30   CBG:  Recent Labs Lab 11/14/12 2124  GLUCAP 111*   Microbiology Recent Results (from the past 240 hour(s))  MRSA PCR SCREENING     Status: None   Collection Time    11/15/12  2:00 AM      Result Value Range Status   MRSA by PCR NEGATIVE  NEGATIVE Final   Comment:            The GeneXpert MRSA Assay (FDA     approved for NASAL specimens     only), is one component of a     comprehensive MRSA colonization     surveillance program. It is not     intended to diagnose MRSA  infection nor to guide or     monitor treatment for     MRSA infections.     Procedures and Diagnostic Studies: Dg Chest 2 View  11/14/2012   *RADIOLOGY REPORT*  Clinical Data: .Fever  CHEST - 2 VIEW  Comparison: 08/10/2012  Findings: Slight peribronchial thickening. Heart and mediastinal contours are within normal limits.  No focal opacities or effusions.  No acute bony abnormality.  IMPRESSION: Slight bronchitic changes.   Original Report Authenticated By: Charlett Nose, M.D.   Ct Head Wo Contrast  11/14/2012   *RADIOLOGY REPORT*  Clinical Data: Seizure.  CT HEAD WITHOUT CONTRAST  Technique:  Contiguous axial images were obtained from the base of the skull through the vertex without contrast.  Comparison:  12/31/2009  Findings: No acute intracranial abnormality.  Specifically, no hemorrhage, hydrocephalus, mass lesion, acute infarction, or significant intracranial injury.  No acute calvarial abnormality. Visualized paranasal sinuses and mastoids clear.  Orbital soft tissues unremarkable.  IMPRESSION: No acute intracranial abnormality.   Original Report Authenticated By: Charlett Nose, M.D.    Scheduled Meds: . antiseptic oral rinse  15 mL Mouth Rinse BID  . cefTRIAXone (ROCEPHIN)  IV  1 g Intravenous Q24H  . citalopram  20 mg Oral Daily  . lamoTRIgine  200 mg Oral Daily  . lamoTRIgine  300 mg Oral QHS  . phenytoin  200 mg Oral BID  . phenytoin  50 mg Oral QHS   Continuous Infusions: . sodium chloride 75 mL/hr at 11/15/12 0548    Time spent: 35 minutes with greater than 50% of time spent reviewing test results with the patient and his wife, discussing diagnostic impression and plan of care.   LOS: 1 day   Keith Dixon  [facility] Pager [phone].   *Please note that the hospitalists switch teams on Wednesdays. Please call the flow manager at [phone] if you are having difficulty reaching the hospitalist taking care of this patient as she can update you and provide the most up-to-date pager number of provider caring for the patient. If 8PM-8AM, please contact night-coverage at [email or web address], password [email or web address]  11/15/2012, 7:11 AM

## 2012-11-15 NOTE — H&P (Signed)
PCP:   Carollee Herter, MD   Chief Complaint:  Breakthrough seizure  HPI: 52 yo male with known sz disorder, ptsd, htn comes in with several breakthrough seizures today and found to have fever over 102.  pts states he has been feeling like feverish for several days.  No n/v/d.  No cp.  No sob.  No cough.  No abd pain.  No dysuria.  No diarrhea.  No rashes.  No sick contacts.  Found to have a uti in ED.  Review of Systems:  Positive and negative as per HPI otherwise all other systems are negative  Past Medical History: Past Medical History  Diagnosis Date  . Seizures   . Hypertension   . PTSD (post-traumatic stress disorder)   . Bipolar disorder   . Tobacco use disorder   . Anxiety   . Depression    History reviewed. No pertinent past surgical history.  Medications: Prior to Admission medications   Medication Sig Start Date End Date Taking? Authorizing Provider  amLODipine (NORVASC) 10 MG tablet TAKE 1 TABLET BY MOUTH EVERY DAY 10/11/12  Yes Ronnald Nian, MD  Cholecalciferol 2000 UNITS CAPS Take 1 capsule by mouth 2 (two) times daily.   Yes Historical Provider, MD  citalopram (CELEXA) 40 MG tablet Take 20 mg by mouth daily. Takes one-half of 40mg  tablet to equal 20mg    Yes Historical Provider, MD  lamoTRIgine (LAMICTAL) 100 MG tablet Take 200-300 mg by mouth 2 (two) times daily. 200mg  in am   And 300mg  at night   Yes Historical Provider, MD  lisinopril-hydrochlorothiazide (PRINZIDE,ZESTORETIC) 20-12.5 MG per tablet Take 1 tablet by mouth daily.   Yes Historical Provider, MD  methocarbamol (ROBAXIN) 500 MG tablet Take 500 mg by mouth 4 (four) times daily.   Yes Historical Provider, MD  naproxen (NAPROSYN) 500 MG tablet Take 500 mg by mouth 2 (two) times daily with a meal.   Yes Historical Provider, MD  phenytoin (DILANTIN) 100 MG ER capsule Take 200 mg by mouth 2 (two) times daily.    Yes Historical Provider, MD  phenytoin (DILANTIN) 50 MG tablet Chew 50 mg by mouth at  bedtime. Chew one tablet by mouth at bedtime  Take with the phenytoin 100mg  capsules at night for a total dose of 200mg  in the morning and 250mg  at bedtime 08/05/12  Yes Nilda Riggs, NP    Allergies:  No Known Allergies  Social History:  reports that he has been smoking Cigarettes.  He has a 20 pack-year smoking history. He has never used smokeless tobacco. He reports that he uses illicit drugs (Marijuana). He reports that he does not drink alcohol.  Family History: Family History  Problem Relation Age of Onset  . Seizures Mother   . Breast cancer Mother 94  . Hypertension Brother     Physical Exam: Filed Vitals:   11/14/12 2121 11/14/12 2316 11/15/12 0038  BP: 141/62  133/70  Pulse: 94    Temp: 103 F (39.4 C) 103.7 F (39.8 C) 101.6 F (38.7 C)  TempSrc: Oral Rectal Oral  Resp: 16  18  Height: 6\' 1"  (1.854 m)    Weight: 120.203 kg (265 lb)    SpO2: 97%     General appearance: alert, cooperative and no distress Head: Normocephalic, without obvious abnormality, atraumatic Eyes: negative Nose: Nares normal. Septum midline. Mucosa normal. No drainage or sinus tenderness. Neck: no JVD and supple, symmetrical, trachea midline Lungs: clear to auscultation bilaterally Heart: regular rate and rhythm,  S1, S2 normal, no murmur, click, rub or gallop Abdomen: soft, non-tender; bowel sounds normal; no masses,  no organomegaly Extremities: extremities normal, atraumatic, no cyanosis or edema Pulses: 2+ and symmetric Skin: Skin color, texture, turgor normal. No rashes or lesions Neurologic: Grossly normal    Labs on Admission:   Recent Labs  11/14/12 2156  NA 137  K 3.6  CL 101  GLUCOSE 114*  BUN 18  CREATININE 1.30    Recent Labs  11/14/12 2149 11/14/12 2156  WBC 8.2  --   NEUTROABS 7.0  --   HGB 13.3 14.6  HCT 38.8* 43.0  MCV 83.1  --   PLT 146*  --    Radiological Exams on Admission: Dg Chest 2 View  11/14/2012   *RADIOLOGY REPORT*  Clinical Data:  .Fever  CHEST - 2 VIEW  Comparison: 08/10/2012  Findings: Slight peribronchial thickening. Heart and mediastinal contours are within normal limits.  No focal opacities or effusions.  No acute bony abnormality.  IMPRESSION: Slight bronchitic changes.   Original Report Authenticated By: Charlett Nose, M.D.   Ct Head Wo Contrast  11/14/2012   *RADIOLOGY REPORT*  Clinical Data: Seizure.  CT HEAD WITHOUT CONTRAST  Technique:  Contiguous axial images were obtained from the base of the skull through the vertex without contrast.  Comparison: 12/31/2009  Findings: No acute intracranial abnormality.  Specifically, no hemorrhage, hydrocephalus, mass lesion, acute infarction, or significant intracranial injury.  No acute calvarial abnormality. Visualized paranasal sinuses and mastoids clear.  Orbital soft tissues unremarkable.  IMPRESSION: No acute intracranial abnormality.   Original Report Authenticated By: Charlett Nose, M.D.    Assessment/Plan 52 yo male with fever, uti, breakthrough seizure  Principal Problem:   Seizure disorder Active Problems:   PTSD (post-traumatic stress disorder)   UTI (lower urinary tract infection)   Fever  Will place pt in stepdown overnight.  Ivf.  Rocephin.  Tylenol has been given.  Urine and blood cx done.  Ck lactic acid level.  Pt appears nontoxic.  Neurology deferred LP at this time per EDP.  Full code.  Cailynn Bodnar A 11/15/2012, 12:51 AM

## 2012-11-16 LAB — URINE CULTURE
Colony Count: NO GROWTH
Culture: NO GROWTH

## 2012-11-16 MED ORDER — SODIUM CHLORIDE 0.9 % IV BOLUS (SEPSIS): 250.0000 mL | Freq: Once | INTRAVENOUS | Status: DC

## 2012-11-16 MED ORDER — CEFUROXIME AXETIL 500 MG PO TABS: 500.0000 mg | ORAL_TABLET | Freq: Two times a day (BID) | ORAL | Status: DC

## 2012-11-16 NOTE — ED Provider Notes (Signed)
Medical screening examination/treatment/procedure(s) were performed by non-physician practitioner and as supervising physician I was immediately available for consultation/collaboration.  [place] T Kerie Badger, MD 11/16/12 1515 

## 2012-11-16 NOTE — Progress Notes (Signed)
Patient discharge to home, alert and oriented, not in distress, no complaints of any pain or discomfort. PIV removed no s/s of swelling or infiltration noted. Discharge instructions and follow up appointments discussed with patient , wife listening.

## 2012-11-16 NOTE — Discharge Summary (Signed)
Physician Discharge Summary  Keith Dixon WGN:[id number] DOB: [birth year] DOA: 11/14/2012  PCP: Carollee Herter, MD  Admit date: 11/14/2012 Discharge date: 11/16/2012  Recommendations for Outpatient Follow-up:  1. F/U with neurologist for recurrent seizures. 2. F/U with PCP for recurrent fevers. 3. F/U final blood culture results.  Discharge Diagnoses:  Principal Problem:     SIRS with fever, unclear source (but likely viral syndrome) Active Problems:    Seizure disorder with breakthrough seizures    PTSD (post-traumatic stress disorder)    Hypertension    Left anterior fascicular block  Discharge Condition: Improved.  Diet recommendation: Regular.  History of present illness:  Keith Dixon is an 52 y.o. male with a PMH of seizure disorder, PTSD, hypertension and bipolar disorder who was admitted on 11/15/2012 with breakthrough seizures in the setting of fever of 102.  Hospital Course by problem:  Principal Problem:  Fever / SIRS -Patient met criteria for SIRS with fever, elevated HR and RR. Lactic acid 1.9 on admission. -Source thought to be from UTI, but cultures were negative.  Possibly secondary to a viral syndrome, but since he defervesced on empiric Rocephin, would continue this for 5 more days.  -Alternatively seizure events could have triggered a hypermetabolic state with fever, tachycardia and tachypnea  -Chest x-ray showed bronchitic changes but no pneumonia.  -Followup final blood cultures which are negative to date.  Active Problems:  Left anterior fascicular block  -No old EKG for comparison.  -Troponins cycled Q 6 hours, and were negative.  No complaints of chest pain. PTSD (post-traumatic stress disorder)  -Continue Celexa.  Seizure disorder with breakthrough seizure  -Managed with Lamictal and Dilantin as an outpatient.  -Dilantin level therapeutic at 11.2.   -May have been triggered by fever.  -Monitored on the step down unit overnight. No  further breakthrough seizures while in the hospital. Hypertension  -Continue blood pressure medication.   Discharge Exam: Filed Vitals:   11/16/12 0555  BP: 126/67  Pulse: 58  Temp: 97.9 F (36.6 C)  Resp: 20   Filed Vitals:   11/15/12 1601 11/15/12 2058 11/16/12 0304 11/16/12 0555  BP:  133/98 126/73 126/67  Pulse:  70  58  Temp:  98.2 F (36.8 C)  97.9 F (36.6 C)  TempSrc:  Oral  Oral  Resp:  20  20  Height: 6\' 1"  (1.854 m)     Weight: 116.665 kg (257 lb 3.2 oz)     SpO2:  100%  100%    Gen:  NAD Cardiovascular:  RRR, No M/R/G Respiratory: Lungs CTAB Gastrointestinal: Abdomen soft, NT/ND with normal active bowel sounds. Extremities: No C/E/C   Discharge Instructions      Discharge Orders   Future Appointments Provider Department Dept Phone   12/29/2012 3:00 PM Nilda Riggs, NP [facility] [phone]   Future Orders Complete By Expires     Call MD for:  temperature >100.4  As directed     Call MD for:  As directed     Scheduling Instructions:      Recurrent problems with seizures.    Diet general  As directed     Discharge instructions  As directed     Comments:      You were cared for by Dr. Hillery Aldo  (a hospitalist) during your hospital stay. If you have any questions about your discharge medications or the care you received while you were in the hospital after you are discharged, you can call the  unit and ask to speak with the hospitalist on call if the hospitalist that took care of you is not available. Once you are discharged, your primary care physician will handle any further medical issues. Please note that NO REFILLS for any discharge medications will be authorized once you are discharged, as it is imperative that you return to your primary care physician (or establish a relationship with a primary care physician if you do not have one) for your aftercare needs so that they can reassess your need for medications and  monitor your lab values.  Any outstanding tests can be reviewed by your PCP at your follow up visit.  It is also important to review any medicine changes with your PCP.  Please bring these d/c instructions with you to your next visit so your physician can review these changes with you.  If you do not have a primary care physician, you can call [phone] for a physician referral.  It is highly recommended that you obtain a PCP for hospital follow up.    Increase activity slowly  As directed         Medication List         amLODipine 10 MG tablet  Commonly known as:  NORVASC  TAKE 1 TABLET BY MOUTH EVERY DAY     cefUROXime 500 MG tablet  Commonly known as:  CEFTIN  Take 1 tablet (500 mg total) by mouth 2 (two) times daily.     Cholecalciferol 2000 UNITS Caps  Take 1 capsule by mouth 2 (two) times daily.     citalopram 40 MG tablet  Commonly known as:  CELEXA  Take 20 mg by mouth daily. Takes one-half of 40mg  tablet to equal 20mg      lamoTRIgine 100 MG tablet  Commonly known as:  LAMICTAL  Take 200-300 mg by mouth 2 (two) times daily. 200mg  in am   And 300mg  at night     lisinopril-hydrochlorothiazide 20-12.5 MG per tablet  Commonly known as:  PRINZIDE,ZESTORETIC  Take 1 tablet by mouth daily.     methocarbamol 500 MG tablet  Commonly known as:  ROBAXIN  Take 500 mg by mouth 4 (four) times daily.     naproxen 500 MG tablet  Commonly known as:  NAPROSYN  Take 500 mg by mouth 2 (two) times daily with a meal.     phenytoin 100 MG ER capsule  Commonly known as:  DILANTIN  Take 200 mg by mouth 2 (two) times daily.     phenytoin 50 MG tablet  Commonly known as:  DILANTIN  Chew 50 mg by mouth at bedtime. Chew one tablet by mouth at bedtime  Take with the phenytoin 100mg  capsules at night for a total dose of 200mg  in the morning and 250mg  at bedtime       Follow-up Information   Schedule an appointment as soon as possible for a visit with [name] Rigg, MD. (As needed)     Contact information:   [address] [place] [address] [phone]       Follow up with [facility]. Schedule an appointment as soon as possible for a visit in 2 weeks.       The results of significant diagnostics from this hospitalization (including imaging, microbiology, ancillary and laboratory) are listed below for reference.    Significant Diagnostic Studies: Dg Chest 2 View  11/14/2012   *RADIOLOGY REPORT*  Clinical Data: .Fever  CHEST - 2 VIEW  Comparison: 08/10/2012  Findings: Slight peribronchial thickening. Heart and mediastinal contours are within normal limits.  No focal opacities or effusions.  No acute bony abnormality.  IMPRESSION: Slight bronchitic changes.   Original Report Authenticated By: Charlett Nose, M.D.   Ct Head Wo Contrast  11/14/2012   *RADIOLOGY REPORT*  Clinical Data: Seizure.  CT HEAD WITHOUT CONTRAST  Technique:  Contiguous axial images were obtained from the base of the skull through the vertex without contrast.  Comparison: 12/31/2009  Findings: No acute intracranial abnormality.  Specifically, no hemorrhage, hydrocephalus, mass lesion, acute infarction, or significant intracranial injury.  No acute calvarial abnormality. Visualized paranasal sinuses and mastoids clear.  Orbital soft tissues unremarkable.  IMPRESSION: No acute intracranial abnormality.   Original Report Authenticated By: Charlett Nose, M.D.    Labs:  Basic Metabolic Panel:  Recent Labs Lab 11/14/12 2156  NA 137  K 3.6  CL 101  GLUCOSE 114*  BUN 18  CREATININE 1.30   GFR Estimated Creatinine Clearance: 91 ml/min (by C-G formula based on Cr of 1.3).  CBC:  Recent Labs Lab 11/14/12 2149 11/14/12 2156  WBC 8.2  --   NEUTROABS 7.0  --   HGB 13.3 14.6  HCT 38.8* 43.0  MCV 83.1  --   PLT 146*  --    Cardiac Enzymes:  Recent Labs Lab 11/15/12 0527 11/15/12 1010 11/15/12 1608  TROPONINI <0.30 <0.30 <0.30   CBG:  Recent Labs Lab 11/14/12 2124   GLUCAP 111*   Microbiology Recent Results (from the past 240 hour(s))  URINE CULTURE     Status: None   Collection Time    11/14/12 10:13 PM      Result Value Range Status   Specimen Description URINE, CLEAN CATCH   Final   Special Requests NONE   Final   Culture  Setup Time 11/15/2012 12:28   Final   Colony Count NO GROWTH   Final   Culture NO GROWTH   Final   Report Status 11/16/2012 FINAL   Final  CULTURE, BLOOD (ROUTINE X 2)     Status: None   Collection Time    11/15/12 12:53 AM      Result Value Range Status   Specimen Description BLOOD LEFT HAND   Final   Special Requests BOTTLES DRAWN AEROBIC AND ANAEROBIC 5CC   Final   Culture  Setup Time 11/15/2012 12:01   Final   Culture     Final   Value:        BLOOD CULTURE RECEIVED NO GROWTH TO DATE CULTURE WILL BE HELD FOR 5 DAYS BEFORE ISSUING A FINAL NEGATIVE REPORT   Report Status PENDING   Incomplete  CULTURE, BLOOD (ROUTINE X 2)     Status: None   Collection Time    11/15/12 12:53 AM      Result Value Range Status   Specimen Description BLOOD RIGHT ANTECUBITAL   Final   Special Requests BOTTLES DRAWN AEROBIC AND ANAEROBIC 5CC   Final   Culture  Setup Time 11/15/2012 12:01   Final   Culture     Final   Value:        BLOOD CULTURE RECEIVED NO GROWTH TO DATE CULTURE WILL BE HELD FOR 5 DAYS BEFORE ISSUING A FINAL NEGATIVE REPORT   Report Status PENDING   Incomplete  MRSA PCR SCREENING     Status: None   Collection Time    11/15/12  2:00 AM      Result Value Range Status   MRSA  by PCR NEGATIVE  NEGATIVE Final   Comment:            The GeneXpert MRSA Assay (FDA     approved for NASAL specimens     only), is one component of a     comprehensive MRSA colonization     surveillance program. It is not     intended to diagnose MRSA     infection nor to guide or     monitor treatment for     MRSA infections.    Time coordinating discharge: 35 minutes.  Signed:  Krystin Keeven  Pager [phone] [facility] 11/16/2012, 1:30 PM

## 2012-11-17 ENCOUNTER — Telehealth: Payer: Self-pay | Admitting: Internal Medicine

## 2012-11-17 NOTE — Telephone Encounter (Signed)
Alona Bene from [facility] called stating that thhis pt was positive for a blood culture. Grain + rod in aerobic bottle. She said she will also fax the results but had to give a verbal over the phone

## 2012-11-18 LAB — CULTURE, BLOOD (ROUTINE X 2)

## 2012-11-21 LAB — CULTURE, BLOOD (ROUTINE X 2): Culture: NO GROWTH

## 2012-12-03 ENCOUNTER — Telehealth: Payer: Self-pay | Admitting: Internal Medicine

## 2012-12-03 NOTE — Telephone Encounter (Signed)
Faxed over medical records to [employer] to [phone]

## 2012-12-14 ENCOUNTER — Telehealth: Payer: Self-pay | Admitting: Nurse Practitioner

## 2012-12-15 DIAGNOSIS — Z0289 Encounter for other administrative examinations: Secondary | ICD-10-CM

## 2012-12-16 NOTE — Telephone Encounter (Signed)
Patient states he was not being truthful about his condition. He was placed in a psych ward in may which caused him to miss his appointment.  His psychiatrist has taking him out of work for good. I asked him for a little  more information and he would not give anything else he wants to talk to CM. Best contact is [phone]

## 2012-12-16 NOTE — Telephone Encounter (Signed)
Called and spoke to pt. He is out of work due to his depression, bipolar disorder ad PTSD Has rescheduled his appt for August 20th. With obtain labs at that time.

## 2012-12-27 DIAGNOSIS — Z0289 Encounter for other administrative examinations: Secondary | ICD-10-CM

## 2012-12-29 ENCOUNTER — Ambulatory Visit (INDEPENDENT_AMBULATORY_CARE_PROVIDER_SITE_OTHER): Payer: BC Managed Care – PPO | Admitting: Nurse Practitioner

## 2012-12-29 ENCOUNTER — Encounter: Payer: Self-pay | Admitting: Nurse Practitioner

## 2012-12-29 ENCOUNTER — Other Ambulatory Visit: Payer: Self-pay | Admitting: Nurse Practitioner

## 2012-12-29 VITALS — BP 169/86 | HR 75 | Ht 73.5 in | Wt 266.0 lb

## 2012-12-29 DIAGNOSIS — Z79899 Other long term (current) drug therapy: Secondary | ICD-10-CM

## 2012-12-29 DIAGNOSIS — F319 Bipolar disorder, unspecified: Secondary | ICD-10-CM

## 2012-12-29 DIAGNOSIS — I10 Essential (primary) hypertension: Secondary | ICD-10-CM

## 2012-12-29 DIAGNOSIS — E669 Obesity, unspecified: Secondary | ICD-10-CM

## 2012-12-29 DIAGNOSIS — G40309 Generalized idiopathic epilepsy and epileptic syndromes, not intractable, without status epilepticus: Secondary | ICD-10-CM

## 2012-12-29 NOTE — Progress Notes (Signed)
Reason for visit followup seizure disorder. HPI: Keith Dixon, 52 year old black male returns for followup. He was last seen in this office 07/15/2012 . He has history of seizure disorder and hypertension. He was started on Lamictal with the plan to gradually taper him off Dilantin. He stopped Dilantin on his own and had seizures, so it was restarted by the ER.  He has some problems in general with missing medications at times.  He was getting his Dilantin from the [place]  He continues to have problems with hypertension and admits that he misses taking his antihypertensive medication about twice per week. He complains of some problems with memory, saying that he forgets what he is talking about in mid-sentence sometimes and that he has had problems at work with putting things in the wrong place. He is now seeing a psychiatrist at the [facility] in [place] who has  taken him out of work. Occasionally misses doses of Dilantin,  due to compliance problems.  Pt never had his sleep study and was made aware he may be at high risk for stroke if he has OSA.   12/29/12: Keith Dixon returns for followup. He had admission to [facility] 11/14/2012 for sepsis. Fever was 102. Dilantin level was 11.2. He continued to have seizures and was given Keppra IV. Since discharge from the hospital he has had one seizure.  He denies missing doses of his Dilantin. He has multiple symptoms on his review of systems. He says he had inpatient psychiatric admission in [place] for 7 days in May. He is following up with the psychiatrist there.    ROS:   fever , fatigue, chest pain, dizziness, blurred vision, shortness of breath, cough, wheezing, snoring, urination problems, memory loss, confusion, headache, numbness, weakness, slurred speech, seizure, depression anxiety, change in appetite, suicidal thoughts, hallucinations, insomnia, sleepiness, snoring, restless legs  Medications Current Outpatient Prescriptions on File Prior to Visit   Medication Sig Dispense Refill  . amLODipine (NORVASC) 10 MG tablet TAKE 1 TABLET BY MOUTH EVERY DAY  30 tablet  PRN  . cefUROXime (CEFTIN) 500 MG tablet Take 1 tablet (500 mg total) by mouth 2 (two) times daily.  10 tablet  0  . Cholecalciferol 2000 UNITS CAPS Take 1 capsule by mouth 2 (two) times daily.      . citalopram (CELEXA) 40 MG tablet Take 20 mg by mouth daily. Takes one-half of 40mg  tablet to equal 20mg       . lamoTRIgine (LAMICTAL) 100 MG tablet Take 200-300 mg by mouth 2 (two) times daily. 200mg  in am   And 300mg  at night      . lisinopril-hydrochlorothiazide (PRINZIDE,ZESTORETIC) 20-12.5 MG per tablet Take 1 tablet by mouth daily.      . methocarbamol (ROBAXIN) 500 MG tablet Take 500 mg by mouth 4 (four) times daily.      . naproxen (NAPROSYN) 500 MG tablet Take 500 mg by mouth 2 (two) times daily with a meal.      . phenytoin (DILANTIN) 100 MG ER capsule Take 200 mg by mouth 2 (two) times daily.       . phenytoin (DILANTIN) 50 MG tablet Chew 50 mg by mouth at bedtime. Chew one tablet by mouth at bedtime  Take with the phenytoin 100mg  capsules at night for a total dose of 200mg  in the morning and 250mg  at bedtime       No current facility-administered medications on file prior to visit.    Allergies No Known Allergies  Physical Exam  General: well developed, obese male  seated, in no evident distress Neurologic Exam Mental Status: Awake and fully alert. Oriented to place and time. Follows all commands. Speech and language normal.   Cranial Nerves:  Pupils equal, briskly reactive to light. Extraocular movements full without nystagmus. Visual fields full to confrontation. Hearing intact and symmetric to finger snap. Facial sensation intact. Face, tongue, palate move normally and symmetrically. Neck flexion and extension normal.  Motor: Normal bulk and tone. Normal strength in all tested extremity muscles.No focal weakness Sensory.: intact to touch and pinprick and vibratory.   Coordination: Rapid alternating movements normal in all extremities. Finger-to-nose and heel-to-shin performed accurately bilaterally. Gait and Station: Arises from chair without difficulty. Stance is normal.  Able to heel, toe and unsteady with tandem walk.  Reflexes: 1+ and symmetric. Toes downgoing.     ASSESSMENT: History of seizure disorder, recent admission with sepsis. One seizure since discharge. Currently on Dilantin 450 total dose     PLAN: Obtain Dilantin level Will call in  new Rx after labs back Followup in 6 months Nilda Riggs, GNP-BC APRN

## 2012-12-29 NOTE — Patient Instructions (Addendum)
Obtain Dilantin level Will call in  New Rx after labs back Followup in 6 months

## 2013-01-04 ENCOUNTER — Telehealth: Payer: Self-pay | Admitting: Nurse Practitioner

## 2013-01-04 NOTE — Telephone Encounter (Signed)
Pt needs to get his Dilantin level drawn.

## 2013-01-05 LAB — PLEASE NOTE

## 2013-01-05 LAB — PHENYTOIN LEVEL, TOTAL: Phenytoin Lvl: 9.3 ug/mL — ABNORMAL LOW (ref 10.0–20.0)

## 2013-01-07 NOTE — Progress Notes (Signed)
Quick Note:  Left a message on the patient's vm relaying the results of their recent labs. Contact information was also given so that the patient may call back if they have any questions. ______ 

## 2013-02-06 ENCOUNTER — Other Ambulatory Visit: Payer: Self-pay | Admitting: Family Medicine

## 2013-06-30 ENCOUNTER — Ambulatory Visit: Payer: BC Managed Care – PPO | Admitting: Nurse Practitioner

## 2013-06-30 ENCOUNTER — Telehealth: Payer: Self-pay | Admitting: Nurse Practitioner

## 2013-06-30 NOTE — Telephone Encounter (Signed)
No show for scheduled appt 

## 2013-10-21 ENCOUNTER — Telehealth: Payer: Self-pay | Admitting: Family Medicine

## 2013-10-21 NOTE — Telephone Encounter (Signed)
lm

## 2013-11-03 DIAGNOSIS — Z0289 Encounter for other administrative examinations: Secondary | ICD-10-CM

## 2013-11-16 ENCOUNTER — Telehealth: Payer: Self-pay | Admitting: Family Medicine

## 2013-11-16 NOTE — Telephone Encounter (Signed)
Last visit here > 1year ago!!!  Send 30 days, get him in for OV, 43min HTN recheck or CPX

## 2013-11-16 NOTE — Telephone Encounter (Signed)
Pt needs WRITTEN rx to take to [place]

## 2013-11-17 ENCOUNTER — Other Ambulatory Visit: Payer: Self-pay | Admitting: Medical

## 2013-11-17 MED ORDER — LISINOPRIL-HYDROCHLOROTHIAZIDE 20-12.5 MG PO TABS: ORAL_TABLET | ORAL | Status: DC

## 2013-11-17 MED ORDER — LISINOPRIL-HYDROCHLOROTHIAZIDE 20-12.5 MG PO TABS: 1.0000 | ORAL_TABLET | Freq: Every day | ORAL | Status: DC

## 2013-11-17 NOTE — Telephone Encounter (Signed)
So when is he coming in for visit, was this scheduled?

## 2013-11-17 NOTE — Telephone Encounter (Signed)
appt made for Monday  

## 2013-11-21 ENCOUNTER — Encounter: Payer: BC Managed Care – PPO | Admitting: Medical

## 2013-11-21 ENCOUNTER — Encounter: Payer: BC Managed Care – PPO | Admitting: Family Medicine

## 2013-11-25 ENCOUNTER — Emergency Department (HOSPITAL_COMMUNITY): Payer: Non-veteran care

## 2013-11-25 ENCOUNTER — Inpatient Hospital Stay (HOSPITAL_COMMUNITY)
Admission: EM | Admit: 2013-11-25 | Discharge: 2013-11-28 | DRG: 871 | Disposition: A | Discharge status: Home / Self Care | Payer: Non-veteran care | Attending: Internal Medicine | Admitting: Internal Medicine

## 2013-11-25 ENCOUNTER — Encounter (HOSPITAL_COMMUNITY): Payer: Self-pay | Admitting: Emergency Medicine

## 2013-11-25 ENCOUNTER — Encounter: Payer: Self-pay | Admitting: Internal Medicine

## 2013-11-25 DIAGNOSIS — N390 Urinary tract infection, site not specified: Secondary | ICD-10-CM | POA: Diagnosis present

## 2013-11-25 DIAGNOSIS — I10 Essential (primary) hypertension: Secondary | ICD-10-CM | POA: Diagnosis present

## 2013-11-25 DIAGNOSIS — I444 Left anterior fascicular block: Secondary | ICD-10-CM

## 2013-11-25 DIAGNOSIS — R197 Diarrhea, unspecified: Secondary | ICD-10-CM | POA: Diagnosis present

## 2013-11-25 DIAGNOSIS — F319 Bipolar disorder, unspecified: Secondary | ICD-10-CM | POA: Diagnosis present

## 2013-11-25 DIAGNOSIS — F17200 Nicotine dependence, unspecified, uncomplicated: Secondary | ICD-10-CM | POA: Diagnosis present

## 2013-11-25 DIAGNOSIS — M6282 Rhabdomyolysis: Secondary | ICD-10-CM | POA: Diagnosis present

## 2013-11-25 DIAGNOSIS — N179 Acute kidney failure, unspecified: Secondary | ICD-10-CM | POA: Diagnosis present

## 2013-11-25 DIAGNOSIS — E871 Hypo-osmolality and hyponatremia: Secondary | ICD-10-CM | POA: Diagnosis present

## 2013-11-25 DIAGNOSIS — Z8249 Family history of ischemic heart disease and other diseases of the circulatory system: Secondary | ICD-10-CM

## 2013-11-25 DIAGNOSIS — G40309 Generalized idiopathic epilepsy and epileptic syndromes, not intractable, without status epilepticus: Secondary | ICD-10-CM

## 2013-11-25 DIAGNOSIS — G9340 Encephalopathy, unspecified: Secondary | ICD-10-CM

## 2013-11-25 DIAGNOSIS — R509 Fever, unspecified: Secondary | ICD-10-CM

## 2013-11-25 DIAGNOSIS — G40909 Epilepsy, unspecified, not intractable, without status epilepticus: Secondary | ICD-10-CM | POA: Diagnosis present

## 2013-11-25 DIAGNOSIS — F4310 Post-traumatic stress disorder, unspecified: Secondary | ICD-10-CM | POA: Diagnosis present

## 2013-11-25 DIAGNOSIS — Z91199 Patient's noncompliance with other medical treatment and regimen due to unspecified reason: Secondary | ICD-10-CM

## 2013-11-25 DIAGNOSIS — A419 Sepsis, unspecified organism: Principal | ICD-10-CM | POA: Diagnosis present

## 2013-11-25 DIAGNOSIS — Z9119 Patient's noncompliance with other medical treatment and regimen: Secondary | ICD-10-CM

## 2013-11-25 DIAGNOSIS — Z803 Family history of malignant neoplasm of breast: Secondary | ICD-10-CM

## 2013-11-25 DIAGNOSIS — E669 Obesity, unspecified: Secondary | ICD-10-CM

## 2013-11-25 DIAGNOSIS — F411 Generalized anxiety disorder: Secondary | ICD-10-CM | POA: Diagnosis present

## 2013-11-25 DIAGNOSIS — R569 Unspecified convulsions: Secondary | ICD-10-CM | POA: Diagnosis present

## 2013-11-25 DIAGNOSIS — F1910 Other psychoactive substance abuse, uncomplicated: Secondary | ICD-10-CM | POA: Diagnosis present

## 2013-11-25 DIAGNOSIS — R6520 Severe sepsis without septic shock: Secondary | ICD-10-CM

## 2013-11-25 DIAGNOSIS — F3170 Bipolar disorder, currently in remission, most recent episode unspecified: Secondary | ICD-10-CM

## 2013-11-25 DIAGNOSIS — K047 Periapical abscess without sinus: Secondary | ICD-10-CM | POA: Diagnosis present

## 2013-11-25 LAB — CBC WITH DIFFERENTIAL/PLATELET
BASOS ABS: 0 10*3/uL (ref 0.0–0.1)
BASOS PCT: 0 % (ref 0–1)
EOS ABS: 0 10*3/uL (ref 0.0–0.7)
EOS PCT: 0 % (ref 0–5)
HCT: 40.4 % (ref 39.0–52.0)
Hemoglobin: 14 g/dL (ref 13.0–17.0)
Lymphocytes Relative: 4 % — ABNORMAL LOW (ref 12–46)
Lymphs Abs: 0.5 10*3/uL — ABNORMAL LOW (ref 0.7–4.0)
MCH: 28.6 pg (ref 26.0–34.0)
MCHC: 34.7 g/dL (ref 30.0–36.0)
MCV: 82.4 fL (ref 78.0–100.0)
Monocytes Absolute: 0.6 10*3/uL (ref 0.1–1.0)
Monocytes Relative: 6 % (ref 3–12)
Neutro Abs: 9.8 10*3/uL — ABNORMAL HIGH (ref 1.7–7.7)
Neutrophils Relative %: 90 % — ABNORMAL HIGH (ref 43–77)
PLATELETS: 142 10*3/uL — AB (ref 150–400)
RBC: 4.9 MIL/uL (ref 4.22–5.81)
RDW: 15 % (ref 11.5–15.5)
WBC: 10.9 10*3/uL — AB (ref 4.0–10.5)

## 2013-11-25 LAB — COMPREHENSIVE METABOLIC PANEL
ALT: 43 U/L (ref 0–53)
AST: 51 U/L — AB (ref 0–37)
Albumin: 3.7 g/dL (ref 3.5–5.2)
Alkaline Phosphatase: 89 U/L (ref 39–117)
Anion gap: 20 — ABNORMAL HIGH (ref 5–15)
BUN: 22 mg/dL (ref 6–23)
CALCIUM: 9.1 mg/dL (ref 8.4–10.5)
CO2: 19 mEq/L (ref 19–32)
Chloride: 93 mEq/L — ABNORMAL LOW (ref 96–112)
Creatinine, Ser: 1.44 mg/dL — ABNORMAL HIGH (ref 0.50–1.35)
GFR calc Af Amer: 64 mL/min — ABNORMAL LOW (ref >90)
GFR calc non Af Amer: 55 mL/min — ABNORMAL LOW (ref >90)
Glucose, Bld: 120 mg/dL — ABNORMAL HIGH (ref 70–99)
Potassium: 3.5 mEq/L — ABNORMAL LOW (ref 3.7–5.3)
Sodium: 132 mEq/L — ABNORMAL LOW (ref 137–147)
TOTAL PROTEIN: 7.6 g/dL (ref 6.0–8.3)
Total Bilirubin: 0.7 mg/dL (ref 0.3–1.2)

## 2013-11-25 LAB — PHENYTOIN LEVEL, TOTAL

## 2013-11-25 LAB — I-STAT CG4 LACTIC ACID, ED: Lactic Acid, Venous: 1.27 mmol/L (ref 0.5–2.2)

## 2013-11-25 LAB — CK: Total CK: 1500 U/L — ABNORMAL HIGH (ref 7–232)

## 2013-11-25 MED ORDER — ACETAMINOPHEN 325 MG PO TABS
650.0000 mg | ORAL_TABLET | Freq: Once | ORAL | Status: AC
  Administered 2013-11-25: 650 mg via ORAL
  Filled 2013-11-25: qty 2

## 2013-11-25 NOTE — ED Notes (Signed)
The pt lives alone and all day the pt has not answered his phone.  He has seizures and he has been having seizures  All day.  The pt lives alone and a family member came over when  She did not get an answer.  The pt AT PRESENT HAS BEEN INCONTINENT OF STOOL AND URINE.  HE IS SLWO TO ANSWER QUESTIONS.  UNKNOWN IF HE HAS BEEN TAKING HIS SEIZURE MEDICATION

## 2013-11-25 NOTE — ED Notes (Signed)
The pt has been c/o a bad odor to his urine and his daughter reports that when he gets like this he usually has a uti

## 2013-11-26 ENCOUNTER — Inpatient Hospital Stay (HOSPITAL_COMMUNITY): Payer: Non-veteran care

## 2013-11-26 ENCOUNTER — Encounter (HOSPITAL_COMMUNITY): Payer: Self-pay

## 2013-11-26 DIAGNOSIS — R509 Fever, unspecified: Secondary | ICD-10-CM

## 2013-11-26 DIAGNOSIS — R569 Unspecified convulsions: Secondary | ICD-10-CM

## 2013-11-26 DIAGNOSIS — G9340 Encephalopathy, unspecified: Secondary | ICD-10-CM

## 2013-11-26 DIAGNOSIS — K047 Periapical abscess without sinus: Secondary | ICD-10-CM | POA: Diagnosis present

## 2013-11-26 DIAGNOSIS — N390 Urinary tract infection, site not specified: Secondary | ICD-10-CM

## 2013-11-26 DIAGNOSIS — G40309 Generalized idiopathic epilepsy and epileptic syndromes, not intractable, without status epilepticus: Secondary | ICD-10-CM

## 2013-11-26 DIAGNOSIS — F319 Bipolar disorder, unspecified: Secondary | ICD-10-CM

## 2013-11-26 LAB — COMPREHENSIVE METABOLIC PANEL
ALT: 39 U/L (ref 0–53)
AST: 44 U/L — AB (ref 0–37)
Albumin: 3.3 g/dL — ABNORMAL LOW (ref 3.5–5.2)
Alkaline Phosphatase: 81 U/L (ref 39–117)
Anion gap: 17 — ABNORMAL HIGH (ref 5–15)
BILIRUBIN TOTAL: 0.6 mg/dL (ref 0.3–1.2)
BUN: 22 mg/dL (ref 6–23)
CHLORIDE: 98 meq/L (ref 96–112)
CO2: 19 mEq/L (ref 19–32)
Calcium: 8.7 mg/dL (ref 8.4–10.5)
Creatinine, Ser: 1.4 mg/dL — ABNORMAL HIGH (ref 0.50–1.35)
GFR calc Af Amer: 66 mL/min — ABNORMAL LOW (ref >90)
GFR, EST NON AFRICAN AMERICAN: 57 mL/min — AB (ref >90)
Glucose, Bld: 122 mg/dL — ABNORMAL HIGH (ref 70–99)
Potassium: 3.4 mEq/L — ABNORMAL LOW (ref 3.7–5.3)
Sodium: 134 mEq/L — ABNORMAL LOW (ref 137–147)
Total Protein: 6.8 g/dL (ref 6.0–8.3)

## 2013-11-26 LAB — CBC WITH DIFFERENTIAL/PLATELET
Basophils Absolute: 0 10*3/uL (ref 0.0–0.1)
Basophils Relative: 0 % (ref 0–1)
Eosinophils Absolute: 0 10*3/uL (ref 0.0–0.7)
Eosinophils Relative: 0 % (ref 0–5)
HEMATOCRIT: 40.8 % (ref 39.0–52.0)
Hemoglobin: 13.8 g/dL (ref 13.0–17.0)
LYMPHS PCT: 4 % — AB (ref 12–46)
Lymphs Abs: 0.3 10*3/uL — ABNORMAL LOW (ref 0.7–4.0)
MCH: 28.4 pg (ref 26.0–34.0)
MCHC: 33.8 g/dL (ref 30.0–36.0)
MCV: 84 fL (ref 78.0–100.0)
MONO ABS: 0.2 10*3/uL (ref 0.1–1.0)
MONOS PCT: 3 % (ref 3–12)
Neutro Abs: 6.8 10*3/uL (ref 1.7–7.7)
Neutrophils Relative %: 93 % — ABNORMAL HIGH (ref 43–77)
Platelets: 100 10*3/uL — ABNORMAL LOW (ref 150–400)
RBC: 4.86 MIL/uL (ref 4.22–5.81)
RDW: 15.3 % (ref 11.5–15.5)
WBC MORPHOLOGY: INCREASED
WBC: 7.3 10*3/uL (ref 4.0–10.5)

## 2013-11-26 LAB — URINALYSIS, ROUTINE W REFLEX MICROSCOPIC
Bilirubin Urine: NEGATIVE
GLUCOSE, UA: NEGATIVE mg/dL
Ketones, ur: 40 mg/dL — AB
NITRITE: POSITIVE — AB
PH: 8.5 — AB (ref 5.0–8.0)
PROTEIN: 100 mg/dL — AB
Specific Gravity, Urine: 1.022 (ref 1.005–1.030)
Urobilinogen, UA: 1 mg/dL (ref 0.0–1.0)

## 2013-11-26 LAB — URINE MICROSCOPIC-ADD ON

## 2013-11-26 LAB — PROTIME-INR
INR: 1.28 (ref 0.00–1.49)
PROTHROMBIN TIME: 16 s — AB (ref 11.6–15.2)

## 2013-11-26 LAB — CK: Total CK: 1585 U/L — ABNORMAL HIGH (ref 7–232)

## 2013-11-26 LAB — RAPID URINE DRUG SCREEN, HOSP PERFORMED
AMPHETAMINES: NOT DETECTED
Barbiturates: NOT DETECTED
Benzodiazepines: NOT DETECTED
Cocaine: NOT DETECTED
OPIATES: NOT DETECTED
Tetrahydrocannabinol: POSITIVE — AB

## 2013-11-26 LAB — ETHANOL: Alcohol, Ethyl (B): <11 mg/dL (ref 0–11)

## 2013-11-26 MED ORDER — AMOXICILLIN-POT CLAVULANATE 875-125 MG PO TABS
1.0000 | ORAL_TABLET | Freq: Two times a day (BID) | ORAL | Status: DC
Start: 2013-11-26 — End: 2013-11-26
  Administered 2013-11-26: 1 via ORAL
  Filled 2013-11-26 (×2): qty 1

## 2013-11-26 MED ORDER — LAMOTRIGINE 100 MG PO TABS
100.0000 mg | ORAL_TABLET | Freq: Every evening | ORAL | Status: DC
  Filled 2013-11-26: qty 1

## 2013-11-26 MED ORDER — ONDANSETRON HCL 4 MG PO TABS
4.0000 mg | ORAL_TABLET | Freq: Four times a day (QID) | ORAL | Status: DC | PRN
Start: 2013-11-26 — End: 2013-11-28

## 2013-11-26 MED ORDER — LAMOTRIGINE 150 MG PO TABS
300.0000 mg | ORAL_TABLET | Freq: Every day | ORAL | Status: DC
  Administered 2013-11-26 – 2013-11-27 (×2): 300 mg via ORAL
  Filled 2013-11-26 (×3): qty 2

## 2013-11-26 MED ORDER — ONDANSETRON HCL 4 MG/2ML IJ SOLN
4.0000 mg | Freq: Once | INTRAMUSCULAR | Status: AC
  Administered 2013-11-26: 4 mg via INTRAVENOUS
  Filled 2013-11-26: qty 2

## 2013-11-26 MED ORDER — SODIUM CHLORIDE 0.9 % IV BOLUS (SEPSIS)
1000.0000 mL | Freq: Once | INTRAVENOUS | Status: AC
  Administered 2013-11-26: 1000 mL via INTRAVENOUS

## 2013-11-26 MED ORDER — DEXTROSE 5 % IV SOLN
1.0000 g | Freq: Three times a day (TID) | INTRAVENOUS | Status: DC
  Administered 2013-11-26 – 2013-11-27 (×3): 1 g via INTRAVENOUS
  Filled 2013-11-26 (×5): qty 1

## 2013-11-26 MED ORDER — CITALOPRAM HYDROBROMIDE 20 MG PO TABS
20.0000 mg | ORAL_TABLET | Freq: Every day | ORAL | Status: DC
  Administered 2013-11-26 – 2013-11-28 (×3): 20 mg via ORAL
  Filled 2013-11-26 (×3): qty 1

## 2013-11-26 MED ORDER — DEXTROSE 5 % IV SOLN
1.0000 g | Freq: Once | INTRAVENOUS | Status: AC
  Administered 2013-11-26: 1 g via INTRAVENOUS
  Filled 2013-11-26: qty 10

## 2013-11-26 MED ORDER — PHENYTOIN 50 MG PO CHEW
50.0000 mg | CHEWABLE_TABLET | Freq: Every day | ORAL | Status: DC
  Administered 2013-11-26: 50 mg via ORAL
  Filled 2013-11-26 (×2): qty 1

## 2013-11-26 MED ORDER — METHOCARBAMOL 500 MG PO TABS
500.0000 mg | ORAL_TABLET | Freq: Four times a day (QID) | ORAL | Status: DC | PRN
  Filled 2013-11-26: qty 1

## 2013-11-26 MED ORDER — LORAZEPAM 2 MG/ML IJ SOLN: 1.0000 mg | INTRAMUSCULAR | Status: DC | PRN

## 2013-11-26 MED ORDER — SODIUM CHLORIDE 0.9 % IV SOLN
INTRAVENOUS | Status: DC
  Administered 2013-11-26: 125 mL/h via INTRAVENOUS
  Administered 2013-11-26: 07:00:00 via INTRAVENOUS
  Administered 2013-11-27: 1000 mL via INTRAVENOUS
  Administered 2013-11-28 (×2): via INTRAVENOUS

## 2013-11-26 MED ORDER — LAMOTRIGINE 200 MG PO TABS
200.0000 mg | ORAL_TABLET | Freq: Every morning | ORAL | Status: DC
  Administered 2013-11-27 – 2013-11-28 (×2): 200 mg via ORAL
  Filled 2013-11-26 (×3): qty 1

## 2013-11-26 MED ORDER — SODIUM CHLORIDE 0.9 % IV SOLN
1000.0000 mg | Freq: Once | INTRAVENOUS | Status: AC
  Administered 2013-11-26: 1000 mg via INTRAVENOUS
  Filled 2013-11-26: qty 20

## 2013-11-26 MED ORDER — AMLODIPINE BESYLATE 10 MG PO TABS
10.0000 mg | ORAL_TABLET | Freq: Every day | ORAL | Status: DC
  Administered 2013-11-26 – 2013-11-28 (×3): 10 mg via ORAL
  Filled 2013-11-26 (×3): qty 1

## 2013-11-26 MED ORDER — VANCOMYCIN HCL IN DEXTROSE 1-5 GM/200ML-% IV SOLN
1000.0000 mg | Freq: Two times a day (BID) | INTRAVENOUS | Status: DC
  Administered 2013-11-27: 1000 mg via INTRAVENOUS
  Filled 2013-11-26 (×2): qty 200

## 2013-11-26 MED ORDER — SODIUM CHLORIDE 0.9 % IJ SOLN
3.0000 mL | Freq: Two times a day (BID) | INTRAMUSCULAR | Status: DC
  Administered 2013-11-26 – 2013-11-27 (×2): 3 mL via INTRAVENOUS

## 2013-11-26 MED ORDER — PRAZOSIN HCL 2 MG PO CAPS
2.0000 mg | ORAL_CAPSULE | Freq: Every day | ORAL | Status: DC
  Administered 2013-11-26 – 2013-11-27 (×2): 2 mg via ORAL
  Filled 2013-11-26 (×3): qty 1

## 2013-11-26 MED ORDER — PHENYTOIN SODIUM EXTENDED 100 MG PO CAPS
200.0000 mg | ORAL_CAPSULE | Freq: Two times a day (BID) | ORAL | Status: DC
  Administered 2013-11-26 – 2013-11-27 (×3): 200 mg via ORAL
  Filled 2013-11-26 (×4): qty 2

## 2013-11-26 MED ORDER — LAMOTRIGINE 200 MG PO TABS: 200.0000 mg | ORAL_TABLET | Freq: Once | ORAL | Status: DC

## 2013-11-26 MED ORDER — SODIUM CHLORIDE 0.9 % IV SOLN
2000.0000 mg | Freq: Once | INTRAVENOUS | Status: AC
  Administered 2013-11-26: 2000 mg via INTRAVENOUS
  Filled 2013-11-26: qty 2000

## 2013-11-26 MED ORDER — ACETAMINOPHEN 325 MG PO TABS
650.0000 mg | ORAL_TABLET | Freq: Four times a day (QID) | ORAL | Status: DC | PRN
  Administered 2013-11-26 (×2): 650 mg via ORAL
  Filled 2013-11-26 (×2): qty 2

## 2013-11-26 MED ORDER — ONDANSETRON HCL 4 MG/2ML IJ SOLN: 4.0000 mg | Freq: Four times a day (QID) | INTRAMUSCULAR | Status: DC | PRN

## 2013-11-26 MED ORDER — DEXTROSE 5 % IV SOLN: 1.0000 g | INTRAVENOUS | Status: DC

## 2013-11-26 MED ORDER — HEPARIN SODIUM (PORCINE) 5000 UNIT/ML IJ SOLN
5000.0000 [IU] | Freq: Three times a day (TID) | INTRAMUSCULAR | Status: DC
  Administered 2013-11-26 – 2013-11-28 (×7): 5000 [IU] via SUBCUTANEOUS
  Filled 2013-11-26 (×8): qty 1

## 2013-11-26 MED ORDER — ACETAMINOPHEN 650 MG RE SUPP
650.0000 mg | Freq: Four times a day (QID) | RECTAL | Status: DC | PRN
Start: 2013-11-26 — End: 2013-11-28

## 2013-11-26 NOTE — ED Notes (Signed)
Pt becoming more alert now.  Able to follow commands easier, sitting up without difficulty for portable chest xray.  Family remains at pt bedside.  No seizure activity since arrival to room.

## 2013-11-26 NOTE — Consult Note (Signed)
NEURO HOSPITALIST CONSULT NOTE    Reason for Consult: seizures  HPI:                                                                                                                                          Keith Dixon is an 53 y.o. male with a past medical history significant for HTN, bipolar disorder, PTSD, and seizure disorder for the last 8 years, admitted to [facility] due to witnessed recurrent seizures at home. Family is at the bedside and they stated that Keith Dixon sometimes misses his seizure medications and goes into a seizure. Patient lives along. Family reports that they know that he went out with friends last night. They've been unable to get in touch with him today. They found him in his house with the air conditioning off in the house for a hot. Patient had 2 witnessed seizures and was incontinent of stool and urine and thus he was brought to the ED. In the ED Dilantin level <2.5 and he received 1 gram IV dilantin. His regular AED regimen consists of dilantin 200-250 mg BID and Lamictal 200-300 mg BID which hasn't been change for several years. Family indicated that Keith Dixon seizures are not well controlled, although many times the seizures occur in the context of poor compliance and alcohol intake. Work up in the hospital significant for UTI/early sepsis and rhabdomyolysis. ETOH <11, UDS positive for marihuana, mild hyponatremia, Cr 1.40, WBC normal. CT brain shows no acute abnormality. At this moment he feels back to his baseline and denies HA, vertigo, double vision, focal weakness or numbness, slurred speech, unsteadiness, language or vision disturbances.    Past Medical History  Diagnosis Date  . Seizures   . Hypertension   . PTSD (post-traumatic stress disorder)   . Bipolar disorder   . Tobacco use disorder   . Anxiety   . Depression     History reviewed. No pertinent past surgical history.  Family History  Problem Relation Age of Onset  .  Seizures Mother   . Breast cancer Mother 105  . Hypertension Brother     Family History:   Social History:  reports that he has been smoking Cigarettes.  He has a 20 pack-year smoking history. He has never used smokeless tobacco. He reports that he uses illicit drugs (Marijuana and Cocaine). He reports that he does not drink alcohol.  No Known Allergies  MEDICATIONS:  I have reviewed the patient's current medications.   ROS:                                                                                                                                       History obtained from chart review and family  General ROS: negative for - chills, fatigue, fever, night sweats, or weight loss Psychological ROS: negative for - behavioral disorder, hallucinations, memory difficulties, or suicidal ideation Ophthalmic ROS: negative for - blurry vision, double vision, eye pain or loss of vision ENT ROS: negative for - epistaxis, nasal discharge, oral lesions, sore throat, tinnitus or vertigo Allergy and Immunology ROS: negative for - hives or itchy/watery eyes Hematological and Lymphatic ROS: negative for - bleeding problems, bruising or swollen lymph nodes Endocrine ROS: negative for - galactorrhea, hair pattern changes, polydipsia/polyuria or temperature intolerance Respiratory ROS: negative for - cough, hemoptysis, shortness of breath or wheezing Cardiovascular ROS: negative for - chest pain, dyspnea on exertion, edema or irregular heartbeat Gastrointestinal ROS: negative for - abdominal pain, diarrhea, hematemesis, nausea/vomiting or stool incontinence Genito-Urinary ROS: negative for - dysuria, hematuria, incontinence or urinary frequency/urgency Musculoskeletal ROS: negative for - joint swelling or muscular weakness Neurological ROS: as noted in HPI Dermatological ROS: negative for  rash and skin lesion changes  Physical exam: pleasant male in no apparent distress. Blood pressure 114/61, pulse 98, temperature 99.4 F (37.4 C), temperature source Oral, resp. rate 18, height _0  (1.854 m), weight 115.894 kg (255 lb 8 oz), SpO2 100.00%. Head: normocephalic. Neck: supple, no bruits, no JVD. Cardiac: no murmurs. Lungs: clear. Abdomen: soft, no tender, no mass. Extremities: no edema. Neurologic Examination:                                                                                                      General: Mental Status: Alert, oriented, thought content appropriate.  Speech fluent without evidence of aphasia.  Able to follow 3 step commands without difficulty. Cranial Nerves: II: Discs flat bilaterally; Visual fields grossly normal, pupils equal, round, reactive to light and accommodation III,IV, VI: ptosis not present, extra-ocular motions intact bilaterally V,VII: smile symmetric, facial light touch sensation normal bilaterally VIII: hearing normal bilaterally IX,X: gag reflex present XI: bilateral shoulder shrug XII: midline tongue extension without atrophy or fasciculations Motor: Right : Upper extremity   5/5    Left:     Upper extremity   5/5  Lower extremity   5/5     Lower extremity  5/5 Tone and bulk:normal tone throughout; no atrophy noted Sensory: Pinprick and light touch intact throughout, bilaterally Deep Tendon Reflexes:  Right: Upper Extremity   Left: Upper extremity   biceps (C-5 to C-6) 2/4   biceps (C-5 to C-6) 2/4 tricep (C7) 2/4    triceps (C7) 2/4 Brachioradialis (C6) 2/4  Brachioradialis (C6) 2/4  Lower Extremity Lower Extremity  quadriceps (L-2 to L-4) 2/4   quadriceps (L-2 to L-4) 2/4 Achilles (S1) 2/4   Achilles (S1) 2/4  Plantars: Right: downgoing   Left: downgoing Cerebellar: normal finger-to-nose,  normal heel-to-shin test Gait: no ataxia.    Lab Results  Component Value Date/Time   CHOL 146 11/07/2010  4:35 AM     Results for orders placed during the hospital encounter of 11/25/13 (from the past 48 hour(s))  CBC WITH DIFFERENTIAL     Status: Abnormal   Collection Time    11/25/13 11:12 PM      Result Value Ref Range   WBC 10.9 (*) 4.0 - 10.5 K/uL   RBC 4.90  4.22 - 5.81 MIL/uL   Hemoglobin 14.0  13.0 - 17.0 g/dL   HCT 40.4  39.0 - 52.0 %   MCV 82.4  78.0 - 100.0 fL   MCH 28.6  26.0 - 34.0 pg   MCHC 34.7  30.0 - 36.0 g/dL   RDW 15.0  11.5 - 15.5 %   Platelets 142 (*) 150 - 400 K/uL   Neutrophils Relative % 90 (*) 43 - 77 %   Neutro Abs 9.8 (*) 1.7 - 7.7 K/uL   Lymphocytes Relative 4 (*) 12 - 46 %   Lymphs Abs 0.5 (*) 0.7 - 4.0 K/uL   Monocytes Relative 6  3 - 12 %   Monocytes Absolute 0.6  0.1 - 1.0 K/uL   Eosinophils Relative 0  0 - 5 %   Eosinophils Absolute 0.0  0.0 - 0.7 K/uL   Basophils Relative 0  0 - 1 %   Basophils Absolute 0.0  0.0 - 0.1 K/uL  COMPREHENSIVE METABOLIC PANEL     Status: Abnormal   Collection Time    11/25/13 11:12 PM      Result Value Ref Range   Sodium 132 (*) 137 - 147 mEq/L   Potassium 3.5 (*) 3.7 - 5.3 mEq/L   Chloride 93 (*) 96 - 112 mEq/L   CO2 19  19 - 32 mEq/L   Glucose, Bld 120 (*) 70 - 99 mg/dL   BUN 22  6 - 23 mg/dL   Creatinine, Ser 1.44 (*) 0.50 - 1.35 mg/dL   Calcium 9.1  8.4 - 10.5 mg/dL   Total Protein 7.6  6.0 - 8.3 g/dL   Albumin 3.7  3.5 - 5.2 g/dL   AST 51 (*) 0 - 37 U/L   ALT 43  0 - 53 U/L   Alkaline Phosphatase 89  39 - 117 U/L   Total Bilirubin 0.7  0.3 - 1.2 mg/dL   GFR calc non Af Amer 55 (*) >90 mL/min   GFR calc Af Amer 64 (*) >90 mL/min   Comment: (NOTE)     The eGFR has been calculated using the CKD EPI equation.     This calculation has not been validated in all clinical situations.     eGFR's persistently <90 mL/min signify possible Chronic Kidney     Disease.   Anion gap 20 (*) 5 - 15  PHENYTOIN LEVEL, TOTAL     Status: Abnormal  Collection Time    11/25/13 11:12 PM      Result Value Ref Range   Phenytoin Lvl  <2.5 (*) 10.0 - 20.0 ug/mL  CK     Status: Abnormal   Collection Time    11/25/13 11:12 PM      Result Value Ref Range   Total CK 1500 (*) 7 - 232 U/L  I-STAT CG4 LACTIC ACID, ED     Status: None   Collection Time    11/25/13 11:46 PM      Result Value Ref Range   Lactic Acid, Venous 1.27  0.5 - 2.2 mmol/L  URINALYSIS, ROUTINE W REFLEX MICROSCOPIC     Status: Abnormal   Collection Time    11/26/13 12:09 AM      Result Value Ref Range   Color, Urine AMBER (*) YELLOW   Comment: BIOCHEMICALS MAY BE AFFECTED BY COLOR   APPearance TURBID (*) CLEAR   Specific Gravity, Urine 1.022  1.005 - 1.030   pH 8.5 (*) 5.0 - 8.0   Glucose, UA NEGATIVE  NEGATIVE mg/dL   Hgb urine dipstick MODERATE (*) NEGATIVE   Bilirubin Urine NEGATIVE  NEGATIVE   Ketones, ur 40 (*) NEGATIVE mg/dL   Protein, ur 100 (*) NEGATIVE mg/dL   Urobilinogen, UA 1.0  0.0 - 1.0 mg/dL   Nitrite POSITIVE (*) NEGATIVE   Leukocytes, UA LARGE (*) NEGATIVE  URINE RAPID DRUG SCREEN (HOSP PERFORMED)     Status: Abnormal   Collection Time    11/26/13 12:09 AM      Result Value Ref Range   Opiates NONE DETECTED  NONE DETECTED   Cocaine NONE DETECTED  NONE DETECTED   Benzodiazepines NONE DETECTED  NONE DETECTED   Amphetamines NONE DETECTED  NONE DETECTED   Tetrahydrocannabinol POSITIVE (*) NONE DETECTED   Barbiturates NONE DETECTED  NONE DETECTED   Comment:            DRUG SCREEN FOR MEDICAL PURPOSES     ONLY.  IF CONFIRMATION IS NEEDED     FOR ANY PURPOSE, NOTIFY LAB     WITHIN 5 DAYS.                LOWEST DETECTABLE LIMITS     FOR URINE DRUG SCREEN     Drug Class       Cutoff (ng/mL)     Amphetamine      1000     Barbiturate      200     Benzodiazepine   903     Tricyclics       009     Opiates          300     Cocaine          300     THC              50  URINE MICROSCOPIC-ADD ON     Status: Abnormal   Collection Time    11/26/13 12:09 AM      Result Value Ref Range   Squamous Epithelial / LPF RARE  RARE    WBC, UA 11-20  <3 WBC/hpf   RBC / HPF 0-2  <3 RBC/hpf   Bacteria, UA MANY (*) RARE   Urine-Other AMORPHOUS URATES/PHOSPHATES    ETHANOL     Status: None   Collection Time    11/26/13  5:40 AM      Result Value Ref Range   Alcohol, Ethyl (B) <11  0 - 11 mg/dL   Comment:            LOWEST DETECTABLE LIMIT FOR     SERUM ALCOHOL IS 11 mg/dL     FOR MEDICAL PURPOSES ONLY  CK     Status: Abnormal   Collection Time    11/26/13  5:40 AM      Result Value Ref Range   Total CK 1585 (*) 7 - 232 U/L  CBC WITH DIFFERENTIAL     Status: Abnormal   Collection Time    11/26/13  5:40 AM      Result Value Ref Range   WBC 7.3  4.0 - 10.5 K/uL   RBC 4.86  4.22 - 5.81 MIL/uL   Hemoglobin 13.8  13.0 - 17.0 g/dL   HCT 40.8  39.0 - 52.0 %   MCV 84.0  78.0 - 100.0 fL   MCH 28.4  26.0 - 34.0 pg   MCHC 33.8  30.0 - 36.0 g/dL   RDW 15.3  11.5 - 15.5 %   Platelets 100 (*) 150 - 400 K/uL   Comment: REPEATED TO VERIFY     PLATELET COUNT CONFIRMED BY SMEAR   Neutrophils Relative % 93 (*) 43 - 77 %   Neutro Abs 6.8  1.7 - 7.7 K/uL   Lymphocytes Relative 4 (*) 12 - 46 %   Lymphs Abs 0.3 (*) 0.7 - 4.0 K/uL   Monocytes Relative 3  3 - 12 %   Monocytes Absolute 0.2  0.1 - 1.0 K/uL   Eosinophils Relative 0  0 - 5 %   Eosinophils Absolute 0.0  0.0 - 0.7 K/uL   Basophils Relative 0  0 - 1 %   Basophils Absolute 0.0  0.0 - 0.1 K/uL   WBC Morphology INCREASED BANDS (>20% BANDS)    COMPREHENSIVE METABOLIC PANEL     Status: Abnormal   Collection Time    11/26/13  5:40 AM      Result Value Ref Range   Sodium 134 (*) 137 - 147 mEq/L   Potassium 3.4 (*) 3.7 - 5.3 mEq/L   Chloride 98  96 - 112 mEq/L   CO2 19  19 - 32 mEq/L   Glucose, Bld 122 (*) 70 - 99 mg/dL   BUN 22  6 - 23 mg/dL   Creatinine, Ser 1.40 (*) 0.50 - 1.35 mg/dL   Calcium 8.7  8.4 - 10.5 mg/dL   Total Protein 6.8  6.0 - 8.3 g/dL   Albumin 3.3 (*) 3.5 - 5.2 g/dL   AST 44 (*) 0 - 37 U/L   ALT 39  0 - 53 U/L   Alkaline Phosphatase 81  39 - 117  U/L   Total Bilirubin 0.6  0.3 - 1.2 mg/dL   GFR calc non Af Amer 57 (*) >90 mL/min   GFR calc Af Amer 66 (*) >90 mL/min   Comment: (NOTE)     The eGFR has been calculated using the CKD EPI equation.     This calculation has not been validated in all clinical situations.     eGFR's persistently <90 mL/min signify possible Chronic Kidney     Disease.   Anion gap 17 (*) 5 - 15  PROTIME-INR     Status: Abnormal   Collection Time    11/26/13  5:40 AM      Result Value Ref Range   Prothrombin Time 16.0 (*) 11.6 - 15.2 seconds   INR 1.28  0.00 -  1.49    Dg Chest 2 View  11/26/2013   CLINICAL DATA:  Evaluate for aspiration pneumonia  EXAM: CHEST  2 VIEW  COMPARISON:  Prior chest x-ray earlier today at 00:11 a.m.  FINDINGS: Stable cardiac and mediastinal contours. Slightly low inspiratory volumes. No focal airspace consolidation or infiltrate. Mild central bronchitic changes similar compared to prior imaging. No pleural effusion or pneumothorax. No acute osseous abnormality.  IMPRESSION: Low inspiratory volumes without evidence of active cardiopulmonary disease.   Electronically Signed   By: Jacqulynn Cadet M.D.   On: 11/26/2013 10:11   Ct Head Wo Contrast  11/26/2013   CLINICAL DATA:  Seizures.  EXAM: CT HEAD WITHOUT CONTRAST  TECHNIQUE: Contiguous axial images were obtained from the base of the skull through the vertex without intravenous contrast.  COMPARISON:  CT of the head performed 11/14/2012  FINDINGS: There is no evidence of acute infarction, mass lesion, or intra- or extra-axial hemorrhage on CT.  The posterior fossa, including the cerebellum, brainstem and fourth ventricle, is within normal limits. The third and lateral ventricles, and basal ganglia are unremarkable in appearance. The cerebral hemispheres are symmetric in appearance, with normal gray-white differentiation. No mass effect or midline shift is seen.  There is no evidence of fracture; visualized osseous structures are  unremarkable in appearance. The orbits are within normal limits. There is mild partial opacification of the left side of the sphenoid sinus. The remaining paranasal sinuses and mastoid air cells are well-aerated. A small periapical abscess is noted involving the root of the right first maxillary molar. No significant soft tissue abnormalities are seen.  IMPRESSION: 1. No acute intracranial pathology seen on CT. 2. Mild partial opacification of the left side of the sphenoid sinus. 3. Small periapical abscess noted involving the root of the right first maxillary molar.   Electronically Signed   By: Garald Balding M.D.   On: 11/26/2013 04:50   Dg Chest Port 1 View  11/26/2013   CLINICAL DATA:  Fever and seizure.  EXAM: PORTABLE CHEST - 1 VIEW  COMPARISON:  Chest radiograph performed 11/14/2012  FINDINGS: The lungs are hypoexpanded. Mild bibasilar airspace opacities may reflect atelectasis or mild pneumonia. No pleural effusion or pneumothorax is seen.  The cardiomediastinal silhouette is within normal limits. No acute osseous abnormalities are seen.  IMPRESSION: Lungs hypoexpanded. Mild bibasilar airspace opacities may reflect atelectasis or mild pneumonia.   Electronically Signed   By: Garald Balding M.D.   On: 11/26/2013 00:23   Assessment/Plan: 53 y/o with known history GTC seizures for the past 8 years, poor adherence to seizure treatment, admitted after sustaining a cluster of seizures at home. Missed dilantin x 2 days with resulting sub-therapeutic dilantin level in the ED, recent alcohol and cocaine, UTI, most likely responsible factors for this cluster of seizures. Get dilantin level in am. Continue current dose dilantin. He is back to baseline at this moment. Will follow up.  Dorian Pod, MD 11/26/2013, 1:29 PM [facility]

## 2013-11-26 NOTE — ED Notes (Signed)
Report given to receiving RN.  Pt to go to CT, then we will take him to his in patient bed.

## 2013-11-26 NOTE — ED Provider Notes (Signed)
CSN: [id number]     Arrival date & time 11/25/13  2237 History   First MD Initiated Contact with Patient 11/25/13 2304     Chief Complaint  Patient presents with  . Seizures     (Consider location/radiation/quality/duration/timing/severity/associated sxs/prior Treatment) HPI 53 year old male presents to emergency department via EMS from home with reported seizures.  Patient has history of seizure disorder.  It is unknown if he has been taking his seizure medication.  Patient lives along.  Family reports that they know that he went out with friends last night.  They've been unable to get in touch with him today.  They found him in his house with the air conditioning off in the house for a hot.  Patient had 2 witnessed seizures and was incontinent of stool and urine.  Patient is oriented to self only.  He denies any pain.  Patient reported to nursing staff that he has done marijuana and cocaine recently.  He has also been drinking alcohol.  Patient noted to be febrile.  Failure reports he has been complaining of strong smelling urine over the last week.  He is followed at the [place]. Past Medical History  Diagnosis Date  . Seizures   . Hypertension   . PTSD (post-traumatic stress disorder)   . Bipolar disorder   . Tobacco use disorder   . Anxiety   . Depression    History reviewed. No pertinent past surgical history. Family History  Problem Relation Age of Onset  . Seizures Mother   . Breast cancer Mother 63  . Hypertension Brother    History  Substance Use Topics  . Smoking status: Current Every Day Smoker -- 1.00 packs/day for 20 years    Types: Cigarettes  . Smokeless tobacco: Never Used  . Alcohol Use: No     Comment: once or twice a year    Review of Systems  See History of Present Illness; otherwise all other systems are reviewed and negative   Allergies  Review of patient's allergies indicates no known allergies.  Home Medications   Prior to Admission medications    Medication Sig Start Date End Date Taking? Authorizing Provider  amLODipine (NORVASC) 10 MG tablet TAKE 1 TABLET BY MOUTH EVERY DAY 10/11/12   Denita Lung, MD  cefUROXime (CEFTIN) 500 MG tablet Take 1 tablet (500 mg total) by mouth 2 (two) times daily. 11/16/12   Venetia Maxon Rama, MD  Cholecalciferol 2000 UNITS CAPS Take 1 capsule by mouth 2 (two) times daily.    Historical Provider, MD  citalopram (CELEXA) 40 MG tablet Take 20 mg by mouth daily. Takes one-half of 40mg  tablet to equal 20mg     Historical Provider, MD  lamoTRIgine (LAMICTAL) 100 MG tablet Take 200-300 mg by mouth 2 (two) times daily. 200mg  in am   And 300mg  at night    Historical Provider, MD  lisinopril-hydrochlorothiazide (PRINZIDE,ZESTORETIC) 20-12.5 MG per tablet Take 1 tablet by mouth daily. 11/17/13   [name] Eng [name], PA-C  methocarbamol (ROBAXIN) 500 MG tablet Take 500 mg by mouth 4 (four) times daily.    Historical Provider, MD  naproxen (NAPROSYN) 500 MG tablet Take 500 mg by mouth 2 (two) times daily with a meal.    Historical Provider, MD  phenytoin (DILANTIN) 100 MG ER capsule Take 200 mg by mouth 2 (two) times daily.     Historical Provider, MD  phenytoin (DILANTIN) 50 MG tablet Chew 50 mg by mouth at bedtime. Chew one tablet by mouth at  bedtime  Take with the phenytoin 100mg  capsules at night for a total dose of 200mg  in the morning and 250mg  at bedtime 08/05/12   Dennie Bible, NP   BP 140/71  Pulse 106  Temp(Src) 101.2 F (38.4 C)  Resp 22  Ht 6\' 4"  (1.93 m)  SpO2 94% Physical Exam  Nursing note and vitals reviewed. Constitutional: He appears well-developed and well-nourished. No distress.  HENT:  Head: Normocephalic and atraumatic.  Right Ear: External ear normal.  Left Ear: External ear normal.  Nose: Nose normal.  Mouth/Throat: Oropharynx is clear and moist.  Patient has bite marks to his anterior tongue.  Dry mucous membrane.  Eyes: Conjunctivae and EOM are normal. Pupils are equal, round, and  reactive to light.  Neck: Normal range of motion. Neck supple. No JVD present. No tracheal deviation present. No thyromegaly present.  Cardiovascular: Regular rhythm, normal heart sounds and intact distal pulses.  Exam reveals no gallop and no friction rub.   No murmur heard. Tachycardia noted  Pulmonary/Chest: Effort normal and breath sounds normal. No stridor. No respiratory distress. He has no wheezes. He has no rales. He exhibits no tenderness.  Abdominal: Soft. Bowel sounds are normal. He exhibits no distension and no mass. There is no tenderness. There is no rebound and no guarding.  Musculoskeletal: Normal range of motion. He exhibits no edema and no tenderness.  Lymphadenopathy:    He has no cervical adenopathy.  Neurological: He is alert. He has normal reflexes. No cranial nerve deficit. He exhibits normal muscle tone. Coordination normal.  Skin: Skin is warm and dry. No rash noted. No erythema. No pallor.  Psychiatric: He has a normal mood and affect. His behavior is normal. Judgment and thought content normal.    ED Course  Procedures (including critical care time) Labs Review Labs Reviewed  CBC WITH DIFFERENTIAL - Abnormal; Notable for the following:    WBC 10.9 (*)    Platelets 142 (*)    Neutrophils Relative % 90 (*)    Neutro Abs 9.8 (*)    Lymphocytes Relative 4 (*)    Lymphs Abs 0.5 (*)    All other components within normal limits  COMPREHENSIVE METABOLIC PANEL - Abnormal; Notable for the following:    Sodium 132 (*)    Potassium 3.5 (*)    Chloride 93 (*)    Glucose, Bld 120 (*)    Creatinine, Ser 1.44 (*)    AST 51 (*)    GFR calc non Af Amer 55 (*)    GFR calc Af Amer 64 (*)    Anion gap 20 (*)    All other components within normal limits  PHENYTOIN LEVEL, TOTAL - Abnormal; Notable for the following:    Phenytoin Lvl <2.5 (*)    All other components within normal limits  URINALYSIS, ROUTINE W REFLEX MICROSCOPIC - Abnormal; Notable for the following:     Color, Urine AMBER (*)    APPearance TURBID (*)    pH 8.5 (*)    Hgb urine dipstick MODERATE (*)    Ketones, ur 40 (*)    Protein, ur 100 (*)    Nitrite POSITIVE (*)    Leukocytes, UA LARGE (*)    All other components within normal limits  URINE RAPID DRUG SCREEN (HOSP PERFORMED) - Abnormal; Notable for the following:    Tetrahydrocannabinol POSITIVE (*)    All other components within normal limits  CK - Abnormal; Notable for the following:    Total  CK 1500 (*)    All other components within normal limits  URINE MICROSCOPIC-ADD ON - Abnormal; Notable for the following:    Bacteria, UA MANY (*)    All other components within normal limits  URINE CULTURE  CULTURE, BLOOD (ROUTINE X 2)  CULTURE, BLOOD (ROUTINE X 2)  LAMOTRIGINE LEVEL  I-STAT CG4 LACTIC ACID, ED    Imaging Review No results found.   EKG Interpretation   Date/Time:  Saturday November 26 2013 02:38:31 EDT Ventricular Rate:  85 PR Interval:  149 QRS Duration: 115 QT Interval:  380 QTC Calculation: 452 R Axis:   -48 Text Interpretation:  Sinus rhythm Probable left atrial enlargement LAD,  consider left anterior fascicular block Left ventricular hypertrophy  Nonspecific T abnormalities, lateral leads ST elev, probable normal early  repol pattern No significant change since last tracing Confirmed by Debhora Titus   MD, Drake Wuertz ([id number]) on 11/26/2013 2:46:16 AM     CRITICAL CARE Performed by: Kalman Drape Total critical care time: 60 min Critical care time was exclusive of separately billable procedures and treating other patients. Critical care was necessary to treat or prevent imminent or life-threatening deterioration. Critical care was time spent personally by me on the following activities: development of treatment plan with patient and/or surrogate as well as nursing, discussions with consultants, evaluation of patient's response to treatment, examination of patient, obtaining history from patient or surrogate, ordering  and performing treatments and interventions, ordering and review of laboratory studies, ordering and review of radiographic studies, pulse oximetry and re-evaluation of patient's condition.  MDM   Final diagnoses:  Seizure  UTI (lower urinary tract infection)  Fever, unspecified fever cause  Encephalopathy    53 year old male with seizures, hyperthermia concern for heat stroke versus infection.  Patient has a history of a seizure disorder, maybe non-therapeutic on his medications.  Plan for IV hydration, control of temperature, and will need admission to the hospital given his encephalopathy.  Kalman Drape, MD 11/26/13 [phone]

## 2013-11-26 NOTE — H&P (Addendum)
[facility] History and Physical  Patient: Keith Dixon  EVO:[id number]  DOB: 24-Jun-1960  DOS: the patient was seen and examined on 11/26/2013 PCP: Verline Lema, MD  Chief Complaint: Unresponsiveness  HPI: Keith Dixon is a 54 y.o. male with Past medical history of seizure disorder, hypertension, bipolar disorder, anxiety, and drug abuse,. Patient presents with an episode of unresponsiveness. History was obtained from patient's family member as patient does not remember the events. As per the family the patient was out with his friends the night on Thursday. Throughout the day on Friday the family was not able to get any response from the patient when they were trying to call him and therefore they went to his house. He found that the patient was in the house there was stool and urine in the house. They also had witnessed 2 seizures which were primarily on the right side which jerking body movements. In between the seizures the patient was not able to communicate and was confused and drowsy. Patient mentions to the nursing staff that he used marijuana and cocaine recently. Family this patient has not taken any of his medications since last 2 days. One month ago he was seen at [facility] and has undergone stress test and it was okay. There are no recent change in his medications. Family reports otherwise the patient is compliant with his medications. Patient was having chills at the time my evaluation.  The patient is coming from home. And at his baseline independent for most of his ADL.  Review of Systems: as mentioned in the history of present illness.  A Comprehensive review of the other systems is negative.  Past Medical History  Diagnosis Date  . Seizures   . Hypertension   . PTSD (post-traumatic stress disorder)   . Bipolar disorder   . Tobacco use disorder   . Anxiety   . Depression    History reviewed. No pertinent past surgical history. Social History:   reports that he has been smoking Cigarettes.  He has a 20 pack-year smoking history. He has never used smokeless tobacco. He reports that he uses illicit drugs (Marijuana and Cocaine). He reports that he does not drink alcohol.  No Known Allergies  Family History  Problem Relation Age of Onset  . Seizures Mother   . Breast cancer Mother 40  . Hypertension Brother     Prior to Admission medications   Medication Sig Start Date End Date Taking? Authorizing Provider  amLODipine (NORVASC) 10 MG tablet Take 10 mg by mouth daily.   Yes Historical Provider, MD  Cholecalciferol 2000 UNITS CAPS Take 1 capsule by mouth 2 (two) times daily.   Yes Historical Provider, MD  citalopram (CELEXA) 40 MG tablet Take 20 mg by mouth daily. Takes one-half of 40mg  tablet to equal 20mg    Yes Historical Provider, MD  hydrOXYzine (VISTARIL) 50 MG capsule Take 50 mg by mouth 2 (two) times daily as needed for anxiety.   Yes Historical Provider, MD  lamoTRIgine (LAMICTAL) 200 MG tablet Take 200-300 mg by mouth 2 (two) times daily. Take one (1) tablet in the morning and one and a half tablet in the evening.   Yes Historical Provider, MD  lisinopril-hydrochlorothiazide (PRINZIDE,ZESTORETIC) 20-12.5 MG per tablet Take 1 tablet by mouth daily. 11/17/13  Yes [name] Eng [name], PA-C  methocarbamol (ROBAXIN) 500 MG tablet Take 500 mg by mouth 4 (four) times daily.   Yes Historical Provider, MD  naproxen (NAPROSYN) 500 MG tablet Take  500 mg by mouth 2 (two) times daily with a meal.   Yes Historical Provider, MD  phenytoin (DILANTIN) 200 MG ER capsule Take 200 mg by mouth 2 (two) times daily.   Yes Historical Provider, MD  phenytoin (DILANTIN) 50 MG tablet Chew 50 mg by mouth at bedtime. Chew one tablet by mouth at bedtime  Take with the phenytoin 100mg  capsules at night for a total dose of 200mg  in the morning and 250mg  at bedtime 08/05/12  Yes Dennie Bible, NP  prazosin (MINIPRESS) 2 MG capsule Take 2 mg by mouth at bedtime.    Yes Historical Provider, MD    Physical Exam: Filed Vitals:   11/26/13 0215 11/26/13 0233 11/26/13 0245 11/26/13 0330  BP: 127/85  123/79 150/106  Pulse: 93  86 110  Temp:  98.6 F (37 C)    TempSrc:  Oral    Resp: 16  16 32  Height:      SpO2: 96%  97% 99%    General: Alert, Awake and Oriented to Time, Place and Person. Appear in mild distress Eyes: PERRL ENT: Oral Mucosa clear dry. Neck: no JVD Cardiovascular: S1 and S2 Present, aortic systolic Murmur, Peripheral Pulses Present Respiratory: Bilateral Air entry equal and Decreased, Clear to Auscultation, noCrackles, no wheezes Abdomen: Bowel Sound Present, Soft and Non tender Skin: no Rash Extremities: no Pedal edema, no calf tenderness Neurologic: Grossly no focal neuro deficit.  Labs on Admission:  CBC:  Recent Labs Lab 11/25/13 2312  WBC 10.9*  NEUTROABS 9.8*  HGB 14.0  HCT 40.4  MCV 82.4  PLT 142*    CMP     Component Value Date/Time   NA 132* 11/25/2013 2312   K 3.5* 11/25/2013 2312   CL 93* 11/25/2013 2312   CO2 19 11/25/2013 2312   GLUCOSE 120* 11/25/2013 2312   BUN 22 11/25/2013 2312   CREATININE 1.44* 11/25/2013 2312   CREATININE 1.19 11/06/2010 1010   CALCIUM 9.1 11/25/2013 2312   PROT 7.6 11/25/2013 2312   ALBUMIN 3.7 11/25/2013 2312   AST 51* 11/25/2013 2312   ALT 43 11/25/2013 2312   ALKPHOS 89 11/25/2013 2312   BILITOT 0.7 11/25/2013 2312   GFRNONAA 55* 11/25/2013 2312   GFRAA 64* 11/25/2013 2312    No results found for this basename: LIPASE, AMYLASE,  in the last 168 hours No results found for this basename: AMMONIA,  in the last 168 hours   Recent Labs Lab 11/25/13 2312  CKTOTAL 1500*   BNP (last 3 results) No results found for this basename: PROBNP,  in the last 8760 hours  Radiological Exams on Admission: Ct Head Wo Contrast  11/26/2013   CLINICAL DATA:  Seizures.  EXAM: CT HEAD WITHOUT CONTRAST  TECHNIQUE: Contiguous axial images were obtained from the base of the skull through the  vertex without intravenous contrast.  COMPARISON:  CT of the head performed 11/14/2012  FINDINGS: There is no evidence of acute infarction, mass lesion, or intra- or extra-axial hemorrhage on CT.  The posterior fossa, including the cerebellum, brainstem and fourth ventricle, is within normal limits. The third and lateral ventricles, and basal ganglia are unremarkable in appearance. The cerebral hemispheres are symmetric in appearance, with normal gray-white differentiation. No mass effect or midline shift is seen.  There is no evidence of fracture; visualized osseous structures are unremarkable in appearance. The orbits are within normal limits. There is mild partial opacification of the left side of the sphenoid sinus. The remaining paranasal sinuses  and mastoid air cells are well-aerated. A small periapical abscess is noted involving the root of the right first maxillary molar. No significant soft tissue abnormalities are seen.  IMPRESSION: 1. No acute intracranial pathology seen on CT. 2. Mild partial opacification of the left side of the sphenoid sinus. 3. Small periapical abscess noted involving the root of the right first maxillary molar.   Electronically Signed   By: Garald Balding M.D.   On: 11/26/2013 04:50   Dg Chest Port 1 View  11/26/2013   CLINICAL DATA:  Fever and seizure.  EXAM: PORTABLE CHEST - 1 VIEW  COMPARISON:  Chest radiograph performed 11/14/2012  FINDINGS: The lungs are hypoexpanded. Mild bibasilar airspace opacities may reflect atelectasis or mild pneumonia. No pleural effusion or pneumothorax is seen.  The cardiomediastinal silhouette is within normal limits. No acute osseous abnormalities are seen.  IMPRESSION: Lungs hypoexpanded. Mild bibasilar airspace opacities may reflect atelectasis or mild pneumonia.   Electronically Signed   By: Garald Balding M.D.   On: 11/26/2013 00:23    EKG: Independently reviewed. normal sinus rhythm, nonspecific ST and T waves  changes. Assessment/Plan Principal Problem:   Seizure Active Problems:   Bipolar disorder   Hypertension   UTI (lower urinary tract infection)   1. Seizure Patient presents with an episode of seizures unresponsiveness. Probably status epilepticus. As per the family's since the patient is visiting his medications in the hospital he is turning around and he is at his baseline. Patient's Dilantin levels are undetectable, and does probably patient may not be taking his medication more than last 2 days. Urine is positive for cannabinoids but negative for cocaine. Patient does not appear to have any focal deficit and CT of the head is negative for any acute abnormality intracranially. With this the patient is already given 1 g of Dilantin loading dose and I would continue him on his regular home medications. Check dilantin level tomorrow.  May need Neurology. Patient also has significant UTI with fever and chills and will be treated with IV ceftriaxone for the same. Patient and family recommended to remain compliant with his current medical regimen to avoid further similar events in the future.   2. UTI Continue IV ceftriaxone IV fluids.  3. Rhabdomyolysis Likely secondary to seizure but Continue IV fluids recheck CPK.  4. Tooth abscess. Continue ceftriaxone, he would need outpatient treatment.  DVT Prophylaxis: subcutaneous Heparin Nutrition: Aspiration precautions  Code Status: Full  Family Communication: Family was present at bedside, opportunity was given to ask question and all questions were answered satisfactorily at the time of interview. Disposition: Admitted to inpatient in telemetry unit.  Author: Berle Mull, MD [facility] Pager: [phone] 11/26/2013, 5:04 AM    If 7PM-7AM, please contact night-coverage [email or web address] Password [email or web address]  **Disclaimer: This note may have been dictated with voice recognition software. Similar sounding words can inadvertently be  transcribed and this note may contain transcription errors which may not have been corrected upon publication of note.**

## 2013-11-26 NOTE — Progress Notes (Addendum)
Patient ID: Keith Dixon  male  IOE:[id number]    DOB: December 17, 1960    DOA: 11/25/2013  PCP: Verline Lema, MD  Assessment/Plan: Principal Problem:   Seizure:  no further seizures during hospitalization - Continue Lamictal, Dilantin, Dilantin level was on detectable - Will call Neurology consult  Active Problems: Fever with UTI/ early sepsis, - Will place on IV vancomycin and cefepime today, narrow antibiotics in next 24 hours - Chest x-ray ordered today, showed no aspiration pneumonia  Small tooth abscess - Continue IV antibiotics - Outpatient Dental appt  Rhabdomyolysis with dehydration - Continue IV fluids  Hypertension - Stable continue present  DVT Prophylaxis:  Code Status:  Family Communication:  Disposition:  Consultants:  Neurology  Procedures:  None   Antibiotics:  Augmentin  IV Rocephin   Subjective: Patient seen and examined, no further seizures, spiking fevers overnight,   Objective: Weight change:   Intake/Output Summary (Last 24 hours) at 11/26/13 1043 Last data filed at 11/26/13 0906  Gross per 24 hour  Intake    300 ml  Output    125 ml  Net    175 ml   Blood pressure 122/64, pulse 109, temperature 100.2 F (37.9 C), temperature source Oral, resp. rate 20, height 6\' 1"  (1.854 m), weight 115.894 kg (255 lb 8 oz), SpO2 99.00%.  Physical Exam: General: Alert and awake, oriented x3, not in any acute distress. CVS: S1-S2 clear, no murmur rubs or gallops Chest: clear to auscultation bilaterally, no wheezing, rales or rhonchi Abdomen: soft nontender, nondistended, normal bowel sounds  Extremities: no cyanosis, clubbing or edema noted bilaterally Neuro: Cranial nerves II-XII intact, no focal neurological deficits  Lab Results: Basic Metabolic Panel:  Recent Labs Lab 11/25/13 2312 11/26/13 0540  NA 132* 134*  K 3.5* 3.4*  CL 93* 98  CO2 19 19  GLUCOSE 120* 122*  BUN 22 22  CREATININE 1.44* 1.40*  CALCIUM 9.1 8.7    Liver Function Tests:  Recent Labs Lab 11/25/13 2312 11/26/13 0540  AST 51* 44*  ALT 43 39  ALKPHOS 89 81  BILITOT 0.7 0.6  PROT 7.6 6.8  ALBUMIN 3.7 3.3*   No results found for this basename: LIPASE, AMYLASE,  in the last 168 hours No results found for this basename: AMMONIA,  in the last 168 hours CBC:  Recent Labs Lab 11/25/13 2312 11/26/13 0540  WBC 10.9* 7.3  NEUTROABS 9.8* 6.8  HGB 14.0 13.8  HCT 40.4 40.8  MCV 82.4 84.0  PLT 142* 100*   Cardiac Enzymes:  Recent Labs Lab 11/25/13 2312 11/26/13 0540  CKTOTAL 1500* 1585*   BNP: No components found with this basename: POCBNP,  CBG: No results found for this basename: GLUCAP,  in the last 168 hours   Micro Results: No results found for this or any previous visit (from the past 240 hour(s)).  Studies/Results: Dg Chest 2 View  11/26/2013   CLINICAL DATA:  Evaluate for aspiration pneumonia  EXAM: CHEST  2 VIEW  COMPARISON:  Prior chest x-ray earlier today at 00:11 a.m.  FINDINGS: Stable cardiac and mediastinal contours. Slightly low inspiratory volumes. No focal airspace consolidation or infiltrate. Mild central bronchitic changes similar compared to prior imaging. No pleural effusion or pneumothorax. No acute osseous abnormality.  IMPRESSION: Low inspiratory volumes without evidence of active cardiopulmonary disease.   Electronically Signed   By: Jacqulynn Cadet M.D.   On: 11/26/2013 10:11   Ct Head Wo Contrast  11/26/2013   CLINICAL DATA:  Seizures.  EXAM: CT HEAD WITHOUT CONTRAST  TECHNIQUE: Contiguous axial images were obtained from the base of the skull through the vertex without intravenous contrast.  COMPARISON:  CT of the head performed 11/14/2012  FINDINGS: There is no evidence of acute infarction, mass lesion, or intra- or extra-axial hemorrhage on CT.  The posterior fossa, including the cerebellum, brainstem and fourth ventricle, is within normal limits. The third and lateral ventricles, and basal  ganglia are unremarkable in appearance. The cerebral hemispheres are symmetric in appearance, with normal gray-white differentiation. No mass effect or midline shift is seen.  There is no evidence of fracture; visualized osseous structures are unremarkable in appearance. The orbits are within normal limits. There is mild partial opacification of the left side of the sphenoid sinus. The remaining paranasal sinuses and mastoid air cells are well-aerated. A small periapical abscess is noted involving the root of the right first maxillary molar. No significant soft tissue abnormalities are seen.  IMPRESSION: 1. No acute intracranial pathology seen on CT. 2. Mild partial opacification of the left side of the sphenoid sinus. 3. Small periapical abscess noted involving the root of the right first maxillary molar.   Electronically Signed   By: Garald Balding M.D.   On: 11/26/2013 04:50   Dg Chest Port 1 View  11/26/2013   CLINICAL DATA:  Fever and seizure.  EXAM: PORTABLE CHEST - 1 VIEW  COMPARISON:  Chest radiograph performed 11/14/2012  FINDINGS: The lungs are hypoexpanded. Mild bibasilar airspace opacities may reflect atelectasis or mild pneumonia. No pleural effusion or pneumothorax is seen.  The cardiomediastinal silhouette is within normal limits. No acute osseous abnormalities are seen.  IMPRESSION: Lungs hypoexpanded. Mild bibasilar airspace opacities may reflect atelectasis or mild pneumonia.   Electronically Signed   By: Garald Balding M.D.   On: 11/26/2013 00:23    Medications: Scheduled Meds: . amLODipine  10 mg Oral Daily  . amoxicillin-clavulanate  1 tablet Oral Q12H  . cefTRIAXone (ROCEPHIN)  IV  1 g Intravenous Q24H  . citalopram  20 mg Oral Daily  . heparin  5,000 Units Subcutaneous 3 times per day  . lamoTRIgine  100 mg Oral QPM  . lamoTRIgine  200 mg Oral q morning - 10a  . phenytoin  50 mg Oral QHS  . phenytoin  200 mg Oral BID  . prazosin  2 mg Oral QHS  . sodium chloride  3 mL  Intravenous Q12H      LOS: 1 day   Maleek Craver M.D. [facility] 11/26/2013, 10:43 AM Pager: [phone]  If 7PM-7AM, please contact night-coverage [email or web address] Password [email or web address]  **Disclaimer: This note was dictated with voice recognition software. Similar sounding words can inadvertently be transcribed and this note may contain transcription errors which may not have been corrected upon publication of note.**

## 2013-11-26 NOTE — ED Notes (Signed)
Pt to CT on stretcher

## 2013-11-26 NOTE — ED Notes (Signed)
A&O x 4.  Vomited and incontinent of stool.

## 2013-11-26 NOTE — ED Notes (Signed)
CG-4 result reported to Dr. Sharol Given

## 2013-11-26 NOTE — Progress Notes (Signed)
ANTIBIOTIC CONSULT NOTE - INITIAL  Pharmacy Consult for Cefepime and Vancomycin Indication: Sepsis/UTI  No Known Allergies  Patient Measurements: Height: 6\' 1"  (185.4 cm) Weight: 255 lb 8 oz (115.894 kg) IBW/kg (Calculated) : 79.9 Adjusted Body Weight: 94.3 kg  Vital Signs: Temp: 99.4 F (37.4 C) (07/18 1200) Temp src: Oral (07/18 1200) BP: 114/61 mmHg (07/18 1200) Pulse Rate: 98 (07/18 1200) Intake/Output from previous day:   Intake/Output from this shift: Total I/O In: 300 [P.O.:300] Out: 125 [Urine:125]  Recent Labs  11/25/13 2312 11/26/13 0540  WBC 10.9* 7.3  HGB 14.0 13.8  PLT 142* 100*  CREATININE 1.44* 1.40*   Estimated Creatinine Clearance: 83.3 ml/min (by C-G formula based on Cr of 1.4). No results found for this basename: VANCOTROUGH, VANCOPEAK, VANCORANDOM, GENTTROUGH, GENTPEAK, GENTRANDOM, TOBRATROUGH, TOBRAPEAK, TOBRARND, AMIKACINPEAK, AMIKACINTROU, AMIKACIN,  in the last 72 hours   Microbiology: No results found for this or any previous visit (from the past 720 hour(s)).  Medical History: Past Medical History  Diagnosis Date  . Seizures   . Hypertension   . PTSD (post-traumatic stress disorder)   . Bipolar disorder   . Tobacco use disorder   . Anxiety   . Depression     Medications:  Prescriptions prior to admission  Medication Sig Dispense Refill  . amLODipine (NORVASC) 10 MG tablet Take 10 mg by mouth daily.      . Cholecalciferol 2000 UNITS CAPS Take 1 capsule by mouth 2 (two) times daily.      . citalopram (CELEXA) 40 MG tablet Take 20 mg by mouth daily. Takes one-half of 40mg  tablet to equal 20mg       . hydrOXYzine (VISTARIL) 50 MG capsule Take 50 mg by mouth 2 (two) times daily as needed for anxiety.      . lamoTRIgine (LAMICTAL) 200 MG tablet Take 200-300 mg by mouth 2 (two) times daily. Take one (1) tablet in the morning and one and a half tablet in the evening.      Marland Kitchen lisinopril-hydrochlorothiazide (PRINZIDE,ZESTORETIC) 20-12.5 MG  per tablet Take 1 tablet by mouth daily.  30 tablet  0  . methocarbamol (ROBAXIN) 500 MG tablet Take 500 mg by mouth 4 (four) times daily.      . naproxen (NAPROSYN) 500 MG tablet Take 500 mg by mouth 2 (two) times daily with a meal.      . phenytoin (DILANTIN) 200 MG ER capsule Take 200 mg by mouth 2 (two) times daily.      . phenytoin (DILANTIN) 50 MG tablet Chew 50 mg by mouth at bedtime. Chew one tablet by mouth at bedtime  Take with the phenytoin 100mg  capsules at night for a total dose of 200mg  in the morning and 250mg  at bedtime      . prazosin (MINIPRESS) 2 MG capsule Take 2 mg by mouth at bedtime.       Assessment: 53yo M admitted d/t seizure and unresponsiveness. Pt being treated for UTI and possible sepsis - pt also has a tooth abscess. Urine and blood cultures pending. Currently afebrile and WBC WNL. CXR neg for aspiration PNA. Pt has good renal fxn.  Goal of Therapy:  Vancomycin trough level 15-20 mcg/ml Eradicate infections  Plan:  1. Vanc 2000mg  IV x1 and Cefipime 1000mg  Q8h 2. Followed by Vanc 1000mg  IV Q12h 3. F/u renal fxn, C&S, clinical status 4. F/u ability to de-escalate abx as appropriate  Drucie Opitz, PharmD Clinical Pharmacy Resident Pager: [phone] 11/26/2013 1:10 PM

## 2013-11-27 DIAGNOSIS — I10 Essential (primary) hypertension: Secondary | ICD-10-CM

## 2013-11-27 DIAGNOSIS — R197 Diarrhea, unspecified: Secondary | ICD-10-CM

## 2013-11-27 DIAGNOSIS — G40909 Epilepsy, unspecified, not intractable, without status epilepticus: Secondary | ICD-10-CM

## 2013-11-27 LAB — URINALYSIS, ROUTINE W REFLEX MICROSCOPIC
Bilirubin Urine: NEGATIVE
Glucose, UA: NEGATIVE mg/dL
Ketones, ur: NEGATIVE mg/dL
NITRITE: NEGATIVE
PROTEIN: NEGATIVE mg/dL
Specific Gravity, Urine: 1.015 (ref 1.005–1.030)
Urobilinogen, UA: 0.2 mg/dL (ref 0.0–1.0)
pH: 6 (ref 5.0–8.0)

## 2013-11-27 LAB — URINE MICROSCOPIC-ADD ON

## 2013-11-27 LAB — BASIC METABOLIC PANEL
Anion gap: 13 (ref 5–15)
BUN: 18 mg/dL (ref 6–23)
CALCIUM: 8.4 mg/dL (ref 8.4–10.5)
CHLORIDE: 99 meq/L (ref 96–112)
CO2: 24 meq/L (ref 19–32)
CREATININE: 1.21 mg/dL (ref 0.50–1.35)
GFR calc non Af Amer: 68 mL/min — ABNORMAL LOW (ref >90)
GFR, EST AFRICAN AMERICAN: 79 mL/min — AB (ref >90)
Glucose, Bld: 161 mg/dL — ABNORMAL HIGH (ref 70–99)
Potassium: 4 mEq/L (ref 3.7–5.3)
Sodium: 136 mEq/L — ABNORMAL LOW (ref 137–147)

## 2013-11-27 LAB — PHENYTOIN LEVEL, TOTAL: Phenytoin Lvl: <2.5 ug/mL — ABNORMAL LOW (ref 10.0–20.0)

## 2013-11-27 LAB — CBC
HEMATOCRIT: 35.6 % — AB (ref 39.0–52.0)
Hemoglobin: 11.8 g/dL — ABNORMAL LOW (ref 13.0–17.0)
MCH: 27.6 pg (ref 26.0–34.0)
MCHC: 33.1 g/dL (ref 30.0–36.0)
MCV: 83.4 fL (ref 78.0–100.0)
Platelets: 94 10*3/uL — ABNORMAL LOW (ref 150–400)
RBC: 4.27 MIL/uL (ref 4.22–5.81)
RDW: 15.4 % (ref 11.5–15.5)
WBC: 8.1 10*3/uL (ref 4.0–10.5)

## 2013-11-27 LAB — CLOSTRIDIUM DIFFICILE BY PCR: Toxigenic C. Difficile by PCR: NEGATIVE

## 2013-11-27 MED ORDER — SACCHAROMYCES BOULARDII 250 MG PO CAPS
250.0000 mg | ORAL_CAPSULE | Freq: Two times a day (BID) | ORAL | Status: DC
  Administered 2013-11-27 – 2013-11-28 (×2): 250 mg via ORAL
  Filled 2013-11-27 (×3): qty 1

## 2013-11-27 MED ORDER — DEXTROSE 5 % IV SOLN
1.0000 g | INTRAVENOUS | Status: DC
  Filled 2013-11-27 (×2): qty 10

## 2013-11-27 MED ORDER — LEVETIRACETAM IN NACL 1500 MG/100ML IV SOLN
1500.0000 mg | Freq: Once | INTRAVENOUS | Status: AC
  Administered 2013-11-27: 1500 mg via INTRAVENOUS
  Filled 2013-11-27 (×2): qty 100

## 2013-11-27 MED ORDER — LEVETIRACETAM ER 500 MG PO TB24
1000.0000 mg | ORAL_TABLET | Freq: Every day | ORAL | Status: DC
  Administered 2013-11-28: 1000 mg via ORAL
  Filled 2013-11-27: qty 2

## 2013-11-27 MED ORDER — LOPERAMIDE HCL 2 MG PO CAPS: 2.0000 mg | ORAL_CAPSULE | ORAL | Status: DC | PRN

## 2013-11-27 NOTE — Progress Notes (Signed)
Patient ID: Keith Dixon  male  BPZ:[id number]    DOB: [birth year]    DOA: 11/25/2013  PCP: Verline Lema, MD  Assessment/Plan: Principal Problem:   Seizure:  no further seizures during hospitalization - Continue Lamictal, Dilantin,  -Dilantin level pending - Neurology following  Active Problems: Fever with UTI/ early sepsis: Afebrile today, having diarrhea - Hold on antibiotics, check C. difficile  - Chest x-ray showed no aspiration pneumonia - Follow urine culture results  Small tooth abscess - Outpatient Dental appt  Diarrhea - Check C. difficile PCR, hold antibiotics  Rhabdomyolysis with dehydration - Continue IV fluids  Hypertension - Stable continue present  DVT Prophylaxis:  Code Status:  Family Communication: Discussed with patient's family yesterday, discussed with patient  Disposition:   Consultants:  Neurology  Procedures:  None   Antibiotics:  Augmentin  IV Rocephin   Subjective: Patient seen and examined, afebrile, no seizure, states had 2 loose bowel movements  Objective: Weight change: 1.95 kg (4 lb 4.8 oz)  Intake/Output Summary (Last 24 hours) at 11/27/13 0949 Last data filed at 11/27/13 2423  Gross per 24 hour  Intake   1086 ml  Output    702 ml  Net    384 ml   Blood pressure 138/81, pulse 89, temperature 98.5 F (36.9 C), temperature source Oral, resp. rate 20, height 6\' 1"  (1.854 m), weight 117.845 kg (259 lb 12.8 oz), SpO2 96.00%.  Physical Exam: General: Alert and awake, oriented x3, not in any acute distress. CVS: S1-S2 clear, no murmur rubs or gallops Chest: clear to auscultation bilaterally, no wheezing, rales or rhonchi Abdomen: soft nontender, nondistended, normal bowel sounds  Extremities: no cyanosis, clubbing or edema noted bilaterally Neuro: Cranial nerves II-XII intact, no focal neurological deficits  Lab Results: Basic Metabolic Panel:  Recent Labs Lab 11/26/13 0540 11/27/13 0425  NA 134* 136*  K  3.4* 4.0  CL 98 99  CO2 19 24  GLUCOSE 122* 161*  BUN 22 18  CREATININE 1.40* 1.21  CALCIUM 8.7 8.4   Liver Function Tests:  Recent Labs Lab 11/25/13 2312 11/26/13 0540  AST 51* 44*  ALT 43 39  ALKPHOS 89 81  BILITOT 0.7 0.6  PROT 7.6 6.8  ALBUMIN 3.7 3.3*   No results found for this basename: LIPASE, AMYLASE,  in the last 168 hours No results found for this basename: AMMONIA,  in the last 168 hours CBC:  Recent Labs Lab 11/26/13 0540 11/27/13 0425  WBC 7.3 8.1  NEUTROABS 6.8  --   HGB 13.8 11.8*  HCT 40.8 35.6*  MCV 84.0 83.4  PLT 100* 94*   Cardiac Enzymes:  Recent Labs Lab 11/25/13 2312 11/26/13 0540  CKTOTAL 1500* 1585*   BNP: No components found with this basename: POCBNP,  CBG: No results found for this basename: GLUCAP,  in the last 168 hours   Micro Results: No results found for this or any previous visit (from the past 240 hour(s)).  Studies/Results: Dg Chest 2 View  11/26/2013   CLINICAL DATA:  Evaluate for aspiration pneumonia  EXAM: CHEST  2 VIEW  COMPARISON:  Prior chest x-ray earlier today at 00:11 a.m.  FINDINGS: Stable cardiac and mediastinal contours. Slightly low inspiratory volumes. No focal airspace consolidation or infiltrate. Mild central bronchitic changes similar compared to prior imaging. No pleural effusion or pneumothorax. No acute osseous abnormality.  IMPRESSION: Low inspiratory volumes without evidence of active cardiopulmonary disease.   Electronically Signed   By: Myrle Sheng  Laurence Ferrari M.D.   On: 11/26/2013 10:11   Ct Head Wo Contrast  11/26/2013   CLINICAL DATA:  Seizures.  EXAM: CT HEAD WITHOUT CONTRAST  TECHNIQUE: Contiguous axial images were obtained from the base of the skull through the vertex without intravenous contrast.  COMPARISON:  CT of the head performed 11/14/2012  FINDINGS: There is no evidence of acute infarction, mass lesion, or intra- or extra-axial hemorrhage on CT.  The posterior fossa, including the cerebellum,  brainstem and fourth ventricle, is within normal limits. The third and lateral ventricles, and basal ganglia are unremarkable in appearance. The cerebral hemispheres are symmetric in appearance, with normal gray-white differentiation. No mass effect or midline shift is seen.  There is no evidence of fracture; visualized osseous structures are unremarkable in appearance. The orbits are within normal limits. There is mild partial opacification of the left side of the sphenoid sinus. The remaining paranasal sinuses and mastoid air cells are well-aerated. A small periapical abscess is noted involving the root of the right first maxillary molar. No significant soft tissue abnormalities are seen.  IMPRESSION: 1. No acute intracranial pathology seen on CT. 2. Mild partial opacification of the left side of the sphenoid sinus. 3. Small periapical abscess noted involving the root of the right first maxillary molar.   Electronically Signed   By: Garald Balding M.D.   On: 11/26/2013 04:50   Dg Chest Port 1 View  11/26/2013   CLINICAL DATA:  Fever and seizure.  EXAM: PORTABLE CHEST - 1 VIEW  COMPARISON:  Chest radiograph performed 11/14/2012  FINDINGS: The lungs are hypoexpanded. Mild bibasilar airspace opacities may reflect atelectasis or mild pneumonia. No pleural effusion or pneumothorax is seen.  The cardiomediastinal silhouette is within normal limits. No acute osseous abnormalities are seen.  IMPRESSION: Lungs hypoexpanded. Mild bibasilar airspace opacities may reflect atelectasis or mild pneumonia.   Electronically Signed   By: Garald Balding M.D.   On: 11/26/2013 00:23    Medications: Scheduled Meds: . amLODipine  10 mg Oral Daily  . citalopram  20 mg Oral Daily  . heparin  5,000 Units Subcutaneous 3 times per day  . lamoTRIgine  200 mg Oral q morning - 10a  . lamoTRIgine  300 mg Oral QHS  . phenytoin  50 mg Oral QHS  . phenytoin  200 mg Oral BID  . prazosin  2 mg Oral QHS  . sodium chloride  3 mL  Intravenous Q12H      LOS: 2 days   [name],RIPUDEEP M.D. [facility] 11/27/2013, 9:49 AM Pager: [phone]  If 7PM-7AM, please contact night-coverage [email or web address] Password [email or web address]  **Disclaimer: This note was dictated with voice recognition software. Similar sounding words can inadvertently be transcribed and this note may contain transcription errors which may not have been corrected upon publication of note.**

## 2013-11-27 NOTE — Progress Notes (Signed)
Utilization Review Completed.Constant [place] T7/19/2015  

## 2013-11-27 NOTE — Progress Notes (Addendum)
NEURO HOSPITALIST PROGRESS NOTE   SUBJECTIVE:                                                                                                                        Uneventful night. No neurological complains. Dilantin level pending.  OBJECTIVE:                                                                                                                           Vital signs in last 24 hours: Temp:  [98.2 F (36.8 C)-99.4 F (37.4 C)] 98.5 F (36.9 C) (07/19 0800) Pulse Rate:  [88-103] 89 (07/19 0800) Resp:  [18-20] 20 (07/19 0800) BP: (114-138)/(59-81) 138/81 mmHg (07/19 0800) SpO2:  [96 %-100 %] 96 % (07/19 0800) Weight:  [117.845 kg (259 lb 12.8 oz)] 117.845 kg (259 lb 12.8 oz) (07/19 0400)  Intake/Output from previous day: 07/18 0701 - 07/19 0700 In: 1023 [P.O.:1020; I.V.:3] Out: 425 [Urine:425] Intake/Output this shift: Total I/O In: 363 [P.O.:360; I.V.:3] Out: 402 [Urine:401; Stool:1] Nutritional status: Cardiac  Past Medical History  Diagnosis Date  . Seizures   . Hypertension   . PTSD (post-traumatic stress disorder)   . Bipolar disorder   . Tobacco use disorder   . Anxiety   . Depression     Neurologic Exam:  Mental Status:  Alert, oriented, thought content appropriate. Speech fluent without evidence of aphasia. Able to follow 3 step commands without difficulty.  Cranial Nerves:  II: Discs flat bilaterally; Visual fields grossly normal, pupils equal, round, reactive to light and accommodation  III,IV, VI: ptosis not present, extra-ocular motions intact bilaterally  V,VII: smile symmetric, facial light touch sensation normal bilaterally  VIII: hearing normal bilaterally  IX,X: gag reflex present  XI: bilateral shoulder shrug  XII: midline tongue extension without atrophy or fasciculations  Motor:  Right : Upper extremity 5/5 Left: Upper extremity 5/5  Lower extremity 5/5 Lower extremity 5/5  Tone and bulk:normal  tone throughout; no atrophy noted  Sensory: Pinprick and light touch intact throughout, bilaterally  Deep Tendon Reflexes:  Right: Upper Extremity Left: Upper extremity  biceps (C-5 to C-6) 2/4 biceps (C-5 to C-6) 2/4  tricep (C7) 2/4 triceps (C7) 2/4  Brachioradialis (C6) 2/4 Brachioradialis (C6) 2/4  Lower Extremity Lower Extremity  quadriceps (L-2 to L-4) 2/4 quadriceps (L-2 to L-4) 2/4  Achilles (S1) 2/4 Achilles (S1) 2/4  Plantars:  Right: downgoing Left: downgoing  Cerebellar:  normal finger-to-nose, normal heel-to-shin test  Gait: no ataxia.   Lab Results: Lab Results  Component Value Date/Time   CHOL 146 11/07/2010  4:35 AM   Lipid Panel No results found for this basename: CHOL, TRIG, HDL, CHOLHDL, VLDL, LDLCALC,  in the last 72 hours  Studies/Results: Dg Chest 2 View  11/26/2013   CLINICAL DATA:  Evaluate for aspiration pneumonia  EXAM: CHEST  2 VIEW  COMPARISON:  Prior chest x-ray earlier today at 00:11 a.m.  FINDINGS: Stable cardiac and mediastinal contours. Slightly low inspiratory volumes. No focal airspace consolidation or infiltrate. Mild central bronchitic changes similar compared to prior imaging. No pleural effusion or pneumothorax. No acute osseous abnormality.  IMPRESSION: Low inspiratory volumes without evidence of active cardiopulmonary disease.   Electronically Signed   By: Jacqulynn Cadet M.D.   On: 11/26/2013 10:11   Ct Head Wo Contrast  11/26/2013   CLINICAL DATA:  Seizures.  EXAM: CT HEAD WITHOUT CONTRAST  TECHNIQUE: Contiguous axial images were obtained from the base of the skull through the vertex without intravenous contrast.  COMPARISON:  CT of the head performed 11/14/2012  FINDINGS: There is no evidence of acute infarction, mass lesion, or intra- or extra-axial hemorrhage on CT.  The posterior fossa, including the cerebellum, brainstem and fourth ventricle, is within normal limits. The third and lateral ventricles, and basal ganglia are unremarkable in  appearance. The cerebral hemispheres are symmetric in appearance, with normal gray-white differentiation. No mass effect or midline shift is seen.  There is no evidence of fracture; visualized osseous structures are unremarkable in appearance. The orbits are within normal limits. There is mild partial opacification of the left side of the sphenoid sinus. The remaining paranasal sinuses and mastoid air cells are well-aerated. A small periapical abscess is noted involving the root of the right first maxillary molar. No significant soft tissue abnormalities are seen.  IMPRESSION: 1. No acute intracranial pathology seen on CT. 2. Mild partial opacification of the left side of the sphenoid sinus. 3. Small periapical abscess noted involving the root of the right first maxillary molar.   Electronically Signed   By: Garald Balding M.D.   On: 11/26/2013 04:50   Dg Chest Port 1 View  11/26/2013   CLINICAL DATA:  Fever and seizure.  EXAM: PORTABLE CHEST - 1 VIEW  COMPARISON:  Chest radiograph performed 11/14/2012  FINDINGS: The lungs are hypoexpanded. Mild bibasilar airspace opacities may reflect atelectasis or mild pneumonia. No pleural effusion or pneumothorax is seen.  The cardiomediastinal silhouette is within normal limits. No acute osseous abnormalities are seen.  IMPRESSION: Lungs hypoexpanded. Mild bibasilar airspace opacities may reflect atelectasis or mild pneumonia.   Electronically Signed   By: Garald Balding M.D.   On: 11/26/2013 00:23    MEDICATIONS  Scheduled: . amLODipine  10 mg Oral Daily  . citalopram  20 mg Oral Daily  . heparin  5,000 Units Subcutaneous 3 times per day  . lamoTRIgine  200 mg Oral q morning - 10a  . lamoTRIgine  300 mg Oral QHS  . phenytoin  50 mg Oral QHS  . phenytoin  200 mg Oral BID  . prazosin  2 mg Oral QHS  . sodium chloride  3 mL Intravenous Q12H     ASSESSMENT/PLAN:                                                                                                           53 y/o with known history GTC seizures for the past 8 years, poor adherence to seizure treatment, admitted after sustaining a cluster of seizures at home. Missed dilantin x 2 days with resulting sub-therapeutic dilantin level in the ED, recent alcohol and cocaine, UTI, most likely responsible factors for this cluster of seizures. Back to baseline. Awaiting dilantin level. Continue same dose Lamictal.  Dorian Pod, MD [facility] [phone]  11/27/2013, 11:12 AM  Addendum: Dilantin level <2.5 despite receiving 1 gram loading dose in the ED yesterday. I suspect that he could be metabolizing dilantin very fast or perhaps some of his daily medications is interacting with dilantin. Therefore, will stop dilantin, load him with 1.5 gram iv keppra now and daily keppra xr 1,000 mg starting tomorrow, which may also help with compliance.  Dorian Pod, MD

## 2013-11-28 DIAGNOSIS — K047 Periapical abscess without sinus: Secondary | ICD-10-CM

## 2013-11-28 LAB — BASIC METABOLIC PANEL
ANION GAP: 13 (ref 5–15)
BUN: 15 mg/dL (ref 6–23)
CALCIUM: 8.2 mg/dL — AB (ref 8.4–10.5)
CO2: 24 mEq/L (ref 19–32)
CREATININE: 1.12 mg/dL (ref 0.50–1.35)
Chloride: 104 mEq/L (ref 96–112)
GFR calc Af Amer: 86 mL/min — ABNORMAL LOW (ref >90)
GFR calc non Af Amer: 74 mL/min — ABNORMAL LOW (ref >90)
Glucose, Bld: 118 mg/dL — ABNORMAL HIGH (ref 70–99)
Potassium: 3.8 mEq/L (ref 3.7–5.3)
Sodium: 141 mEq/L (ref 137–147)

## 2013-11-28 LAB — CBC
HEMATOCRIT: 34.9 % — AB (ref 39.0–52.0)
Hemoglobin: 11.6 g/dL — ABNORMAL LOW (ref 13.0–17.0)
MCH: 27.6 pg (ref 26.0–34.0)
MCHC: 33.2 g/dL (ref 30.0–36.0)
MCV: 82.9 fL (ref 78.0–100.0)
Platelets: 110 10*3/uL — ABNORMAL LOW (ref 150–400)
RBC: 4.21 MIL/uL — ABNORMAL LOW (ref 4.22–5.81)
RDW: 15.2 % (ref 11.5–15.5)
WBC: 6.3 10*3/uL (ref 4.0–10.5)

## 2013-11-28 LAB — URINE CULTURE
Colony Count: NO GROWTH
Culture: NO GROWTH

## 2013-11-28 MED ORDER — CIPROFLOXACIN HCL 500 MG PO TABS
500.0000 mg | ORAL_TABLET | Freq: Once | ORAL | Status: AC
  Administered 2013-11-28: 500 mg via ORAL
  Filled 2013-11-28: qty 1

## 2013-11-28 MED ORDER — LEVETIRACETAM ER 500 MG PO TB24: 1000.0000 mg | ORAL_TABLET | Freq: Every day | ORAL | Status: DC

## 2013-11-28 MED ORDER — LAMOTRIGINE 200 MG PO TABS: 200.0000 mg | ORAL_TABLET | Freq: Two times a day (BID) | ORAL | Status: DC

## 2013-11-28 MED ORDER — SACCHAROMYCES BOULARDII 250 MG PO CAPS: 250.0000 mg | ORAL_CAPSULE | Freq: Two times a day (BID) | ORAL | Status: DC

## 2013-11-28 MED ORDER — CIPROFLOXACIN HCL 500 MG PO TABS: 500.0000 mg | ORAL_TABLET | Freq: Two times a day (BID) | ORAL | Status: DC

## 2013-11-28 MED ORDER — LOPERAMIDE HCL 2 MG PO TABS: 2.0000 mg | ORAL_TABLET | Freq: Four times a day (QID) | ORAL | Status: DC | PRN

## 2013-11-28 NOTE — Discharge Summary (Signed)
Physician Discharge Summary  Patient ID: Keith Dixon MRN: [id number] DOB/AGE: 51-May-1962 51 y.o.  Admit date: 11/25/2013 Discharge date: 11/28/2013  Primary Care Physician:  Verline Lema, MD  Discharge Diagnoses:    . Seizure Rhabdomyolysis  Acute kidney injury - resolved  UTI  . Bipolar disorder . Hypertension . Tooth abscess . Diarrhea  Consults:  Neurology, Dr. Aram Beecham   Recommendations for Outpatient Follow-up:  Please follow on urine culture results, currently showing gram-negative rods  Please note Dilantin was discontinued by neurology and patient is not placed on Keppra. He will continue Lamictal.  Allergies:  No Known Allergies   Discharge Medications:   Medication List    STOP taking these medications       phenytoin 200 MG ER capsule  Commonly known as:  DILANTIN     phenytoin 50 MG tablet  Commonly known as:  DILANTIN      TAKE these medications       amLODipine 10 MG tablet  Commonly known as:  NORVASC  Take 10 mg by mouth daily.     Cholecalciferol 2000 UNITS Caps  Take 1 capsule by mouth 2 (two) times daily.     ciprofloxacin 500 MG tablet  Commonly known as:  CIPRO  Take 1 tablet (500 mg total) by mouth 2 (two) times daily. x10 days     citalopram 40 MG tablet  Commonly known as:  CELEXA  Take 20 mg by mouth daily. Takes one-half of 40mg  tablet to equal 20mg      hydrOXYzine 50 MG capsule  Commonly known as:  VISTARIL  Take 50 mg by mouth 2 (two) times daily as needed for anxiety.     lamoTRIgine 200 MG tablet  Commonly known as:  LAMICTAL  Take 1-1.5 tablets (200-300 mg total) by mouth 2 (two) times daily. Take one (1) tablet in the morning and one and a half tablet in the evening.     levETIRAcetam 500 MG 24 hr tablet  Commonly known as:  KEPPRA XR  Take 2 tablets (1,000 mg total) by mouth daily.     lisinopril-hydrochlorothiazide 20-12.5 MG per tablet  Commonly known as:  PRINZIDE,ZESTORETIC  Take 1 tablet by mouth  daily.     loperamide 2 MG tablet  Commonly known as:  IMODIUM A-D  Take 1 tablet (2 mg total) by mouth 4 (four) times daily as needed for diarrhea or loose stools.     methocarbamol 500 MG tablet  Commonly known as:  ROBAXIN  Take 500 mg by mouth 4 (four) times daily.     naproxen 500 MG tablet  Commonly known as:  NAPROSYN  Take 500 mg by mouth 2 (two) times daily with a meal.     prazosin 2 MG capsule  Commonly known as:  MINIPRESS  Take 2 mg by mouth at bedtime.     saccharomyces boulardii 250 MG capsule  Commonly known as:  FLORASTOR  Take 1 capsule (250 mg total) by mouth 2 (two) times daily. While on antibiotics         Brief H and P: For complete details please refer to admission H and P, but in brief Keith Dixon is a 53 y.o. male with Past medical history of seizure disorder, hypertension, bipolar disorder, anxiety, and drug abuse,.  Patient presented with an episode of unresponsiveness. History was obtained from patient's family member as patient does not remember the events. As per the family the patient was out with his friends  the night on Thursday. Throughout the day on Friday the family was not able to get any response from the patient when they were trying to call him and therefore they went to his house.  He found that the patient was in the house there was stool and urine in the house. They also had witnessed 2 seizures which were primarily on the right side which jerking body movements. In between the seizures the patient was not able to communicate and was confused and drowsy. Patient mentioned to the nursing staff that he used marijuana and cocaine recently.  Per family, patient has not taken any of his medications since last 2 days.  One month ago he was seen at [facility] and has undergone stress test and it was okay.  There are no recent change in his medications. Family reported otherwise the patient is compliant with his medications.  Patient was  having chills at the time my evaluation.   Hospital Course:  Patient is a 53 year old male with history of seizures presented with seizure episodes, rhabdomyolysis with acute renal insufficiency. He was also found to have daily fevers, chills and urinary tract infection. Seizure: Patient had no further seizures during hospitalization.  He was placed back on Lamictal and was given Dilantin loading dose and continued on his outpatient dose of Dilantin. Neurology was consulted. Despite a loading dose of Dilantin, level was still less than 2.5. Hence, neurology decided to change to Keppra. He was given loading dose of Keppra and started on 1000 mg daily.  Fever with UTI/ early sepsis: Afebrile now. Patient was restarted on IV vancomycin and Zosyn for possibility of early sepsis, blood cultures remain negative to date. Urine culture showed gram-negative rods. Despite the urine culture results from 11/25/13, sensitivities are still pending hence patient placed on oral ciprofloxacin for 10 days. His urine culture results will need to be followed closely. Patient did not have any abdominal/flank pain or pyelonephritis or prostatitis.  Small tooth abscess incidentally seen on CT scan- Outpatient Dental appt   Diarrhea : C. difficile negative, resolved   Acute kidney injury with Rhabdomyolysis with dehydration : Patient was placed on IV fluid hydration and creatinine function has improved from 1.4 to 1.1 at discharge.   Hypertension - Stable     Day of Discharge BP 145/87  Pulse 66  Temp(Src) 97.7 F (36.5 C) (Oral)  Resp 18  Ht 6\' 1"  (1.854 m)  Wt 118.071 kg (260 lb 4.8 oz)  BMI 34.35 kg/m2  SpO2 99%  Physical Exam: General: Alert and awake oriented x3 not in any acute distress. HEENT: anicteric sclera, pupils reactive to light and accommodation CVS: S1-S2 clear no murmur rubs or gallops Chest: clear to auscultation bilaterally, no wheezing rales or rhonchi Abdomen: soft nontender,  nondistended, normal bowel sounds Extremities: no cyanosis, clubbing or edema noted bilaterally Neuro: Cranial nerves II-XII intact, no focal neurological deficits   The results of significant diagnostics from this hospitalization (including imaging, microbiology, ancillary and laboratory) are listed below for reference.    LAB RESULTS: Basic Metabolic Panel:  Recent Labs Lab 11/27/13 0425 11/28/13 0357  NA 136* 141  K 4.0 3.8  CL 99 104  CO2 24 24  GLUCOSE 161* 118*  BUN 18 15  CREATININE 1.21 1.12  CALCIUM 8.4 8.2*   Liver Function Tests:  Recent Labs Lab 11/25/13 2312 11/26/13 0540  AST 51* 44*  ALT 43 39  ALKPHOS 89 81  BILITOT 0.7 0.6  PROT 7.6 6.8  ALBUMIN 3.7 3.3*   No results found for this basename: LIPASE, AMYLASE,  in the last 168 hours No results found for this basename: AMMONIA,  in the last 168 hours CBC:  Recent Labs Lab 11/26/13 0540 11/27/13 0425 11/28/13 0357  WBC 7.3 8.1 6.3  NEUTROABS 6.8  --   --   HGB 13.8 11.8* 11.6*  HCT 40.8 35.6* 34.9*  MCV 84.0 83.4 82.9  PLT 100* 94* 110*   Cardiac Enzymes:  Recent Labs Lab 11/25/13 2312 11/26/13 0540  CKTOTAL 1500* 1585*   BNP: No components found with this basename: POCBNP,  CBG: No results found for this basename: GLUCAP,  in the last 168 hours  Significant Diagnostic Studies:  Dg Chest 2 View  11/26/2013   CLINICAL DATA:  Evaluate for aspiration pneumonia  EXAM: CHEST  2 VIEW  COMPARISON:  Prior chest x-ray earlier today at 00:11 a.m.  FINDINGS: Stable cardiac and mediastinal contours. Slightly low inspiratory volumes. No focal airspace consolidation or infiltrate. Mild central bronchitic changes similar compared to prior imaging. No pleural effusion or pneumothorax. No acute osseous abnormality.  IMPRESSION: Low inspiratory volumes without evidence of active cardiopulmonary disease.   Electronically Signed   By: Jacqulynn Cadet M.D.   On: 11/26/2013 10:11   Ct Head Wo  Contrast  11/26/2013   CLINICAL DATA:  Seizures.  EXAM: CT HEAD WITHOUT CONTRAST  TECHNIQUE: Contiguous axial images were obtained from the base of the skull through the vertex without intravenous contrast.  COMPARISON:  CT of the head performed 11/14/2012  FINDINGS: There is no evidence of acute infarction, mass lesion, or intra- or extra-axial hemorrhage on CT.  The posterior fossa, including the cerebellum, brainstem and fourth ventricle, is within normal limits. The third and lateral ventricles, and basal ganglia are unremarkable in appearance. The cerebral hemispheres are symmetric in appearance, with normal gray-white differentiation. No mass effect or midline shift is seen.  There is no evidence of fracture; visualized osseous structures are unremarkable in appearance. The orbits are within normal limits. There is mild partial opacification of the left side of the sphenoid sinus. The remaining paranasal sinuses and mastoid air cells are well-aerated. A small periapical abscess is noted involving the root of the right first maxillary molar. No significant soft tissue abnormalities are seen.  IMPRESSION: 1. No acute intracranial pathology seen on CT. 2. Mild partial opacification of the left side of the sphenoid sinus. 3. Small periapical abscess noted involving the root of the right first maxillary molar.   Electronically Signed   By: Garald Balding M.D.   On: 11/26/2013 04:50   Dg Chest Port 1 View  11/26/2013   CLINICAL DATA:  Fever and seizure.  EXAM: PORTABLE CHEST - 1 VIEW  COMPARISON:  Chest radiograph performed 11/14/2012  FINDINGS: The lungs are hypoexpanded. Mild bibasilar airspace opacities may reflect atelectasis or mild pneumonia. No pleural effusion or pneumothorax is seen.  The cardiomediastinal silhouette is within normal limits. No acute osseous abnormalities are seen.  IMPRESSION: Lungs hypoexpanded. Mild bibasilar airspace opacities may reflect atelectasis or mild pneumonia.    Electronically Signed   By: Garald Balding M.D.   On: 11/26/2013 00:23       Disposition and Follow-up:     Discharge Instructions   Diet - low sodium heart healthy    Complete by:  As directed      Increase activity slowly    Complete by:  As directed  DISPOSITION: Home  DIET: Heart healthy diet    DISCHARGE FOLLOW-UP Follow-up Information   Follow up with PERRY, Charlott Holler, MD. Schedule an appointment as soon as possible for a visit in 10 days. (for hospital follow-up)    Specialty:  Internal Medicine   Contact information:   [address] [place] [address] [phone]       Time spent on Discharge: 45 mins  Signed:   [name],RIPUDEEP M.D. [facility] 11/28/2013, 1:39 PM Pager: [phone]   **Disclaimer: This note was dictated with voice recognition software. Similar sounding words can inadvertently be transcribed and this note may contain transcription errors which may not have been corrected upon publication of note.**

## 2013-11-28 NOTE — Discharge Instructions (Signed)
Seizure, Adult A seizure means there is unusual activity in the brain. A seizure can cause changes in attention or behavior. Seizures often cause shaking (convulsions). Seizures often last from 30 seconds to 2 minutes. HOME CARE   If you are given medicines, take them exactly as told by your doctor.  Keep all doctor visits as told.  Do not swim or drive until your doctor says it is okay.  Teach others what to do if you have a seizure. They should:  Lay you on the ground.  Put a cushion under your head.  Loosen any tight clothing around your neck.  Turn you on your side.  Stay with you until you get better. GET HELP RIGHT AWAY IF:   The seizure lasts longer than 2 to 5 minutes.  The seizure is very bad.  The person does not wake up after the seizure.  The person's attention or behavior changes. Drive the person to the emergency room or call your local emergency services (911 in [place] MAKE SURE YOU:   Understand these instructions.  Will watch your condition.  Will get help right away if you are not doing well or get worse. Document Released: 10/15/2007 Document Revised: 07/21/2011 Document Reviewed: 04/16/2011 [facility] Patient Information 2015 [place], [place]. This information is not intended to replace advice given to you by your health care provider. Make sure you discuss any questions you have with your health care provider.   Urinary Tract Infection A urinary tract infection (UTI) can occur any place along the urinary tract. The tract includes the kidneys, ureters, bladder, and urethra. A type of germ called bacteria often causes a UTI. UTIs are often helped with antibiotic medicine.  HOME CARE   If given, take antibiotics as told by your doctor. Finish them even if you start to feel better.  Drink enough fluids to keep your pee (urine) clear or pale yellow.  Avoid tea, drinks with caffeine, and bubbly (carbonated) drinks.  Pee often. Avoid holding your pee in  for a long time.  Pee before and after having sex (intercourse).  Wipe from front to back after you poop (bowel movement) if you are a woman. Use each tissue only once. GET HELP RIGHT AWAY IF:   You have back pain.  You have lower belly (abdominal) pain.  You have chills.  You feel sick to your stomach (nauseous).  You throw up (vomit).  Your burning or discomfort with peeing does not go away.  You have a fever.  Your symptoms are not better in 3 days. MAKE SURE YOU:   Understand these instructions.  Will watch your condition.  Will get help right away if you are not doing well or get worse. Document Released: 10/15/2007 Document Revised: 01/21/2012 Document Reviewed: 11/27/2011 [facility] Patient Information 2015 [place], [place]. This information is not intended to replace advice given to you by your health care provider. Make sure you discuss any questions you have with your health care provider.  Antibiotic Medication Antibiotic medicine helps fight germs. Germs cause infections. This type of medicine will not work for colds, flu, or other viral infections. Tell your doctor if you:  Are allergic to any medicines.  Are pregnant or are trying to get pregnant.  Are taking other medicines.  Have other medical problems. HOME CARE  Take your medicine with a glass of water or food as told by your doctor.  Take the medicine as told. Finish them even if you start to feel better.  Do not give your medicine to other people.  Do not use your medicine in the future for a different infection.  Ask your doctor about which side effects to watch for.  Try not to miss any doses. If you miss a dose, take it as soon as possible. If it is almost time for your next dose, and your dosing schedule is:  Two doses a day, take the missed dose and the next dose 5 to 6 hours later.  Three or more doses a day, take the missed dose and the next dose 2 to 4 hours later, or double your next  dose.  Then go back to your normal schedule. GET HELP RIGHT AWAY IF:   You get worse or do not get better within a few days.  The medicine makes you sick.  You develop a rash or any other side effects.  You have questions or concerns. MAKE SURE YOU:  Understand these instructions.  Will watch your condition.  Will get help right away if you are not doing well or get worse. Document Released: 02/05/2008 Document Revised: 07/21/2011 Document Reviewed: 04/03/2009 [facility] Patient Information 2015 [place], [place]. This information is not intended to replace advice given to you by your health care provider. Make sure you discuss any questions you have with your health care provider.   Cardiac Diet This diet can help prevent heart disease and stroke. Many factors influence your heart health, including eating and exercise habits. Coronary risk rises a lot with abnormal blood fat (lipid) levels. Cardiac meal planning includes limiting unhealthy fats, increasing healthy fats, and making other small dietary changes. General guidelines are as follows:  Adjust calorie intake to reach and maintain desirable body weight.  Limit total fat intake to less than 30% of total calories. Saturated fat should be less than 7% of calories.  Saturated fats are found in animal products and in some vegetable products. Saturated vegetable fats are found in coconut oil, cocoa butter, palm oil, and palm kernel oil. Read labels carefully to avoid these products as much as possible. Use butter in moderation. Choose tub margarines and oils that have 2 grams of fat or less. Good cooking oils are canola and olive oils.  Practice low-fat cooking techniques. Do not fry food. Instead, broil, bake, boil, steam, grill, roast on a rack, stir-fry, or microwave it. Other fat reducing suggestions include:  Remove the skin from poultry.  Remove all visible fat from meats.  Skim the fat off stews, soups, and gravies before  serving them.  Steam vegetables in water or broth instead of sauting them in fat.  Avoid foods with trans fat (or hydrogenated oils), such as commercially fried foods and commercially baked goods. Commercial shortening and deep-frying fats will contain trans fat.  Increase intake of fruits, vegetables, whole grains, and legumes to replace foods high in fat.  Increase consumption of nuts, legumes, and seeds to at least 4 servings weekly. One serving of a legume equals  cup, and 1 serving of nuts or seeds equals  cup.  Choose whole grains more often. Have 3 servings per day (a serving is 1 ounce [oz]).  Eat 4 to 5 servings of vegetables per day. A serving of vegetables is 1 cup of raw leafy vegetables;  cup of raw or cooked cut-up vegetables;  cup of vegetable juice.  Eat 4 to 5 servings of fruit per day. A serving of fruit is 1 medium whole fruit;  cup of dried fruit;  cup  of fresh, frozen, or canned fruit;  cup of 100% fruit juice.  Increase your intake of dietary fiber to 20 to 30 grams per day. Insoluble fiber may help lower your risk of heart disease and may help curb your appetite.  Soluble fiber binds cholesterol to be removed from the blood. Foods high in soluble fiber are dried beans, citrus fruits, oats, apples, bananas, broccoli, Brussels sprouts, and eggplant.  Try to include foods fortified with plant sterols or stanols, such as yogurt, breads, juices, or margarines. Choose several fortified foods to achieve a daily intake of 2 to 3 grams of plant sterols or stanols.  Foods with omega-3 fats can help reduce your risk of heart disease. Aim to have a 3.5 oz portion of fatty fish twice per week, such as salmon, mackerel, albacore tuna, sardines, lake trout, or herring. If you wish to take a fish oil supplement, choose one that contains 1 gram of both DHA and EPA.  Limit processed meats to 2 servings (3 oz portion) weekly.  Limit the sodium in your diet to 1500 milligrams (mg)  per day. If you have high blood pressure, talk to a registered dietitian about a DASH (Dietary Approaches to Stop Hypertension) eating plan.  Limit sweets and beverages with added sugar, such as soda, to no more than 5 servings per week. One serving is:   1 tablespoon sugar.  1 tablespoon jelly or jam.   cup sorbet.  1 cup lemonade.   cup regular soda. CHOOSING FOODS Starches  Allowed: Breads: All kinds (wheat, rye, raisin, white, oatmeal, [place], [place], and English muffin bread). Low-fat rolls: English muffins, frankfurter and hamburger buns, bagels, pita bread, tortillas (not fried). Pancakes, waffles, biscuits, and muffins made with recommended oil.  Avoid: Products made with saturated or trans fats, oils, or whole milk products. Butter rolls, cheese breads, croissants. Commercial doughnuts, muffins, sweet rolls, biscuits, waffles, pancakes, store-bought mixes. Crackers  Allowed: Low-fat crackers and snacks: Animal, graham, rye, saltine (with recommended oil, no lard), oyster, and matzo crackers. Bread sticks, melba toast, rusks, flatbread, pretzels, and light popcorn.  Avoid: High-fat crackers: cheese crackers, butter crackers, and those made with coconut, palm oil, or trans fat (hydrogenated oils). Buttered popcorn. Cereals  Allowed: Hot or cold whole-grain cereals.  Avoid: Cereals containing coconut, hydrogenated vegetable fat, or animal fat. Potatoes / Pasta / Rice  Allowed: All kinds of potatoes, rice, and pasta (such as macaroni, spaghetti, and noodles).  Avoid: Pasta or rice prepared with cream sauce or high-fat cheese. Chow mein noodles, [place] fries. Vegetables  Allowed: All vegetables and vegetable juices.  Avoid: Fried vegetables. Vegetables in cream, butter, or high-fat cheese sauces. Limit coconut. Fruit in cream or custard. Protein  Allowed: Limit your intake of meat, seafood, and poultry to no more than 6 oz (cooked weight) per day. All lean,  well-trimmed beef, veal, pork, and lamb. All chicken and [place] without skin. All fish and shellfish. Wild game: wild duck, rabbit, pheasant, and venison. Egg whites or low-cholesterol egg substitutes may be used as desired. Meatless dishes: recipes with dried beans, peas, lentils, and tofu (soybean curd). Seeds and nuts: all seeds and most nuts.  Avoid: Prime grade and other heavily marbled and fatty meats, such as short ribs, spare ribs, rib eye roast or steak, frankfurters, sausage, bacon, and high-fat luncheon meats, mutton. Caviar. Commercially fried fish. Domestic duck, goose, venison sausage. Organ meats: liver, gizzard, heart, chitterlings, brains, kidney, sweetbreads. Dairy  Allowed: Low-fat cheeses: nonfat or low-fat cottage cheese (  1% or 2% fat), cheeses made with part skim milk, such as mozzarella, farmers, string, or ricotta. (Cheeses should be labeled no more than 2 to 6 grams fat per oz.). Skim (or 1%) milk: liquid, powdered, or evaporated. Buttermilk made with low-fat milk. Drinks made with skim or low-fat milk or cocoa. Chocolate milk or cocoa made with skim or low-fat (1%) milk. Nonfat or low-fat yogurt.  Avoid: Whole milk cheeses, including colby, cheddar, [place], Monterey Jack, [place], [place], [place], American, Swiss, and blue. Creamed cottage cheese, cream cheese. Whole milk and whole milk products, including buttermilk or yogurt made from whole milk, drinks made from whole milk. Condensed milk, evaporated whole milk, and 2% milk. Soups and Combination Foods  Allowed: Low-fat low-sodium soups: broth, dehydrated soups, homemade broth, soups with the fat removed, homemade cream soups made with skim or low-fat milk. Low-fat spaghetti, lasagna, chili, and Spanish rice if low-fat ingredients and low-fat cooking techniques are used.  Avoid: Cream soups made with whole milk, cream, or high-fat cheese. All other soups. Desserts and Sweets  Allowed: Sherbet, fruit ices, gelatins,  meringues, and angel food cake. Homemade desserts with recommended fats, oils, and milk products. Jam, jelly, honey, marmalade, sugars, and syrups. Pure sugar candy, such as gum drops, hard candy, jelly beans, marshmallows, mints, and small amounts of dark chocolate.  Avoid: Commercially prepared cakes, pies, cookies, frosting, pudding, or mixes for these products. Desserts containing whole milk products, chocolate, coconut, lard, palm oil, or palm kernel oil. Ice cream or ice cream drinks. Candy that contains chocolate, coconut, butter, hydrogenated fat, or unknown ingredients. Buttered syrups. Fats and Oils  Allowed: Vegetable oils: safflower, sunflower, corn, soybean, cottonseed, sesame, canola, olive, or peanut. Non-hydrogenated margarines. Salad dressing or mayonnaise: homemade or commercial, made with a recommended oil. Low or nonfat salad dressing or mayonnaise.  Limit added fats and oils to 6 to 8 tsp per day (includes fats used in cooking, baking, salads, and spreads on bread). Remember to count the "hidden fats" in foods.  Avoid: Solid fats and shortenings: butter, lard, salt pork, bacon drippings. Gravy containing meat fat, shortening, or suet. Cocoa butter, coconut. Coconut oil, palm oil, palm kernel oil, or hydrogenated oils: these ingredients are often used in bakery products, nondairy creamers, whipped toppings, candy, and commercially fried foods. Read labels carefully. Salad dressings made of unknown oils, sour cream, or cheese, such as blue cheese and Roquefort. Cream, all kinds: half-and-half, light, heavy, or whipping. Sour cream or cream cheese (even if "light" or low-fat). Nondairy cream substitutes: coffee creamers and sour cream substitutes made with palm, palm kernel, hydrogenated oils, or coconut oil. Beverages  Allowed: Coffee (regular or decaffeinated), tea. Diet carbonated beverages, mineral water. Alcohol: Check with your caregiver. Moderation is recommended.  Avoid: Whole  milk, regular sodas, and juice drinks with added sugar. Condiments  Allowed: All seasonings and condiments. Cocoa powder. "Cream" sauces made with recommended ingredients.  Avoid: Carob powder made with hydrogenated fats. SAMPLE MENU Breakfast   cup orange juice   cup oatmeal  1 slice toast  1 tsp margarine  1 cup skim milk Lunch  [place] sandwich with 2 oz [place], 2 slices bread  Lettuce and tomato slices  Fresh fruit  Carrot sticks  Coffee or tea Snack  Fresh fruit or low-fat crackers Dinner  3 oz lean ground beef  1 baked potato  1 tsp margarine   cup asparagus  Lettuce salad  1 tbs non-creamy dressing   cup peach slices  1 cup skim  milk Document Released: 02/05/2008 Document Revised: 10/28/2011 Document Reviewed: 07/22/2011 [facility] Patient Information 2015 [place], [place]. This information is not intended to replace advice given to you by your health care provider. Make sure you discuss any questions you have with your health care provider.

## 2013-11-29 LAB — URINE CULTURE: Colony Count: >=100000

## 2013-11-29 LAB — LAMOTRIGINE LEVEL: LAMOTRIGINE LVL: 0.8 ug/mL — AB (ref 4.0–18.0)

## 2013-12-02 LAB — CULTURE, BLOOD (ROUTINE X 2)
Culture: NO GROWTH
Culture: NO GROWTH

## 2016-10-17 ENCOUNTER — Encounter (HOSPITAL_COMMUNITY): Payer: Self-pay | Admitting: Emergency Medicine

## 2016-10-17 ENCOUNTER — Emergency Department (HOSPITAL_COMMUNITY)
Admission: EM | Admit: 2016-10-17 | Discharge: 2016-10-18 | Disposition: A | Discharge status: Home / Self Care | Payer: Non-veteran care | Attending: Emergency Medicine | Admitting: Emergency Medicine

## 2016-10-17 DIAGNOSIS — G6189 Other inflammatory polyneuropathies: Secondary | ICD-10-CM | POA: Insufficient documentation

## 2016-10-17 DIAGNOSIS — F17210 Nicotine dependence, cigarettes, uncomplicated: Secondary | ICD-10-CM | POA: Insufficient documentation

## 2016-10-17 DIAGNOSIS — I10 Essential (primary) hypertension: Secondary | ICD-10-CM | POA: Insufficient documentation

## 2016-10-17 DIAGNOSIS — N3000 Acute cystitis without hematuria: Secondary | ICD-10-CM

## 2016-10-17 DIAGNOSIS — N200 Calculus of kidney: Secondary | ICD-10-CM | POA: Insufficient documentation

## 2016-10-17 NOTE — ED Provider Notes (Signed)
TIME SEEN: 11:55 PM  By signing my name below, I, Margit Banda, attest that this documentation has been prepared under the direction and in the presence of Rondo Spittler, [name], DO. Electronically Signed: Margit Banda, ED Scribe. 10/17/16. 11:59 PM.  CHIEF COMPLAINT: Flank Pain  HPI: Keith Dixon is a 56 y.o. male with a PMHx of seizures on Keppra who presents to the Emergency Department complaining of sudden right flank pain that started ~ 8 pm (10/17/16). Described as sharp and severe without radiation. Never had anything like this before. Associated sx include generalized weakness, nausea, vomiting, and diaphoresis. Movement does not exacerbate pain. No aggravating or alleviating factors.  No recent traumas. No allergies. Pt denies CP, SOB, penile discharge, testicular pain or swelling, dysuria, hematuria, numbness or tingling, focal weakness, urinary retention or bowel and bladder incontinence.   Pt had a seizure 10/17/16 ~ 9 am. He notes having ~ 1-2 seizures a week. States this is his baseline. He reports compliance with his Keppra.  Followed at the [place].   ROS: See HPI Constitutional: no fever  Eyes: no drainage  ENT: no runny nose   Cardiovascular:  no chest pain  Resp: no SOB  GI: + nausea, + vomiting GU: no dysuria Integumentary: no rash  Allergy: no hives  Musculoskeletal: no leg swelling  Neurological: no slurred speech ROS otherwise negative  PAST MEDICAL HISTORY/PAST SURGICAL HISTORY:  Past Medical History:  Diagnosis Date  . Anxiety   . Bipolar disorder ([place])   . Depression   . Hypertension   . PTSD (post-traumatic stress disorder)   . Seizures ([place])   . Tobacco use disorder     MEDICATIONS:  Prior to Admission medications   Medication Sig Start Date End Date Taking? Authorizing Provider  amLODipine (NORVASC) 10 MG tablet Take 10 mg by mouth daily.    [provider]  Cholecalciferol 2000 UNITS CAPS Take 1 capsule by mouth 2 (two) times daily.     [provider]  ciprofloxacin (CIPRO) 500 MG tablet Take 1 tablet (500 mg total) by mouth 2 (two) times daily. x10 days 11/28/13   Mendel Corning, MD  citalopram (CELEXA) 40 MG tablet Take 20 mg by mouth daily. Takes one-half of 40mg  tablet to equal 20mg     [provider]  hydrOXYzine (VISTARIL) 50 MG capsule Take 50 mg by mouth 2 (two) times daily as needed for anxiety.    [provider]  lamoTRIgine (LAMICTAL) 200 MG tablet Take 1-1.5 tablets (200-300 mg total) by mouth 2 (two) times daily. Take one (1) tablet in the morning and one and a half tablet in the evening. 11/28/13   [name], [name], MD  levETIRAcetam (KEPPRA XR) 500 MG 24 hr tablet Take 2 tablets (1,000 mg total) by mouth daily. 11/28/13   [name], [name], MD  lisinopril-hydrochlorothiazide (PRINZIDE,ZESTORETIC) 20-12.5 MG per tablet Take 1 tablet by mouth daily. 11/17/13   [name], [name] Eng, PA-C  loperamide (IMODIUM A-D) 2 MG tablet Take 1 tablet (2 mg total) by mouth 4 (four) times daily as needed for diarrhea or loose stools. 11/28/13   [name], [name], MD  methocarbamol (ROBAXIN) 500 MG tablet Take 500 mg by mouth 4 (four) times daily.    [provider]  naproxen (NAPROSYN) 500 MG tablet Take 500 mg by mouth 2 (two) times daily with a meal.    [provider]  prazosin (MINIPRESS) 2 MG capsule Take 2 mg by mouth at bedtime.  [provider]  saccharomyces boulardii (FLORASTOR) 250 MG capsule Take 1 capsule (250 mg total) by mouth 2 (two) times daily. While on antibiotics 11/28/13   [name], [name], MD    ALLERGIES:  No Known Allergies  SOCIAL HISTORY:  Social History  Substance Use Topics  . Smoking status: Current Every Day Smoker    Packs/day: 1.00    Years: 20.00    Types: Cigarettes  . Smokeless tobacco: Never Used  . Alcohol use No     Comment: once or twice a year    FAMILY HISTORY: Family History  Problem Relation Age of Onset  . Seizures Mother   . Breast  cancer Mother 29  . Hypertension Brother     EXAM: BP (!) 162/88   Pulse 84   Temp 97.4 F (36.3 C) (Oral)   Resp 16   Ht 6\' 1"  (1.854 m)   Wt 270 lb (122.5 kg)   SpO2 100%   BMI 35.62 kg/m   CONSTITUTIONAL: Alert and oriented and responds appropriately to questions. Well-appearing; well-nourished, Obese, does appear uncomfortable but is nontoxic and afebrile HEAD: Normocephalic, Atraumatic EYES: Conjunctivae clear, pupils appear equal, EOMI ENT: normal nose; moist mucous membranes NECK: Supple, no meningismus, no nuchal rigidity, no LAD; no midline spinal tenderness or step-off or deformity CARD: RRR; S1 and S2 appreciated; no murmurs, no clicks, no rubs, no gallops RESP: Normal chest excursion without splinting or tachypnea; breath sounds clear and equal bilaterally; no wheezes, no rhonchi, no rales, no hypoxia or respiratory distress, speaking full sentences ABD/GI: Normal bowel sounds; non-distended; soft, non-tender, no rebound, no guarding, no peritoneal signs, no hepatosplenomegaly BACK:  The back appears normal and is non-tender to palpation, there is no CVA tenderness, no midline spinal tenderness or step-off or deformity EXT: Normal ROM in all joints; non-tender to palpation; no edema; normal capillary refill; no cyanosis, no calf tenderness or swelling    SKIN: Normal color for age and race; warm; no rash NEURO: Moves all extremities equally, sensation to light touch intact diffusely, cranial nerves II through XII intact, no saddle anesthesia, normal speech PSYCH: The patient's mood and manner are appropriate. Grooming and personal hygiene are appropriate.  MEDICAL DECISION MAKING: Patient here with sudden onset right flank pain. Differential diagnosis includes kidney stone, polynephritis, UTI, muscular skeletal back pain. We'll obtain labs, urine, CT of abdomen and pelvis. We'll give IV fluids for pain and nausea medicine.  ED PROGRESS: Patient's labs show mildly elevated  creatinine which appears to be his baseline. Urine shows nitrites, trace leukocytes, 6-30 white blood cells and few bacteria. We'll send urine culture but will give ceftriaxone for possible urinary tract infection. CT scan shows punctate 1 mm calculus at the mid to distal right ureter at the pelvic brim without associated hydroureteronephrosis. Pain has completely resolved. He reports feeling much better. I feel he is safe to be discharged him with pain and nausea medicine as well as Flomax. We'll discharge with Keflex as well and have him follow-up with her outpatient urologist. Discussed return precautions with patient and family. They're comfortable with this plan.  At this time, I do not feel there is any life-threatening condition present. I have reviewed and discussed all results (EKG, imaging, lab, urine as appropriate) and exam findings with patient/family. I have reviewed nursing notes and appropriate previous records.  I feel the patient is safe to be discharged home without further emergent workup and can continue workup as an outpatient as needed. Discussed usual  and customary return precautions. Patient/family verbalize understanding and are comfortable with this plan.  Outpatient follow-up has been provided if needed. All questions have been answered.   I personally performed the services described in this documentation, which was scribed in my presence. The recorded information has been reviewed and is accurate.     Keziah Drotar, [name], DO 10/18/16 [phone]

## 2016-10-17 NOTE — ED Triage Notes (Signed)
Pt BIB EMS from home. C/o R flank pain. No hx of kidney stones. Sudden onset at approx 2030 this evening. Reports some N/V, though no emesis with EMS. 150mcg fentanyl from EMS. No recent trauma. Extremely hypertensive initially for EMS with SBP >220.

## 2016-10-17 NOTE — ED Triage Notes (Signed)
Pt reports seizure today, approx 0900. Last seizure prior to today, approx one week ago. Established hx of same.

## 2016-10-18 ENCOUNTER — Emergency Department (HOSPITAL_COMMUNITY): Payer: Non-veteran care

## 2016-10-18 ENCOUNTER — Emergency Department (HOSPITAL_COMMUNITY)
Admission: EM | Admit: 2016-10-18 | Discharge: 2016-10-18 | Disposition: A | Discharge status: Home / Self Care | Payer: Non-veteran care | Attending: Emergency Medicine | Admitting: Emergency Medicine

## 2016-10-18 DIAGNOSIS — Z79899 Other long term (current) drug therapy: Secondary | ICD-10-CM | POA: Insufficient documentation

## 2016-10-18 DIAGNOSIS — R569 Unspecified convulsions: Secondary | ICD-10-CM

## 2016-10-18 DIAGNOSIS — I10 Essential (primary) hypertension: Secondary | ICD-10-CM | POA: Diagnosis not present

## 2016-10-18 DIAGNOSIS — F17210 Nicotine dependence, cigarettes, uncomplicated: Secondary | ICD-10-CM | POA: Insufficient documentation

## 2016-10-18 DIAGNOSIS — G40909 Epilepsy, unspecified, not intractable, without status epilepticus: Secondary | ICD-10-CM | POA: Insufficient documentation

## 2016-10-18 LAB — URINALYSIS, ROUTINE W REFLEX MICROSCOPIC
Bilirubin Urine: NEGATIVE
Glucose, UA: NEGATIVE mg/dL
Hgb urine dipstick: NEGATIVE
Ketones, ur: NEGATIVE mg/dL
Nitrite: POSITIVE — AB
PH: 7 (ref 5.0–8.0)
Protein, ur: NEGATIVE mg/dL
Specific Gravity, Urine: 1.013 (ref 1.005–1.030)

## 2016-10-18 LAB — CBC WITH DIFFERENTIAL/PLATELET
BASOS ABS: 0 10*3/uL (ref 0.0–0.1)
Basophils Relative: 0 %
Eosinophils Absolute: 0 10*3/uL (ref 0.0–0.7)
Eosinophils Relative: 0 %
HCT: 38.1 % — ABNORMAL LOW (ref 39.0–52.0)
Hemoglobin: 12.6 g/dL — ABNORMAL LOW (ref 13.0–17.0)
Lymphocytes Relative: 17 %
Lymphs Abs: 1.5 10*3/uL (ref 0.7–4.0)
MCH: 26.8 pg (ref 26.0–34.0)
MCHC: 33.1 g/dL (ref 30.0–36.0)
MCV: 81.1 fL (ref 78.0–100.0)
MONO ABS: 0.4 10*3/uL (ref 0.1–1.0)
Monocytes Relative: 4 %
Neutro Abs: 6.8 10*3/uL (ref 1.7–7.7)
Neutrophils Relative %: 79 %
Platelets: 217 10*3/uL (ref 150–400)
RBC: 4.7 MIL/uL (ref 4.22–5.81)
RDW: 15.7 % — AB (ref 11.5–15.5)
WBC: 8.7 10*3/uL (ref 4.0–10.5)

## 2016-10-18 LAB — COMPREHENSIVE METABOLIC PANEL
ALK PHOS: 66 U/L (ref 38–126)
ALT: 24 U/L (ref 17–63)
AST: 27 U/L (ref 15–41)
Albumin: 4.1 g/dL (ref 3.5–5.0)
Anion gap: 9 (ref 5–15)
BUN: 15 mg/dL (ref 6–20)
CALCIUM: 9.3 mg/dL (ref 8.9–10.3)
CO2: 23 mmol/L (ref 22–32)
CREATININE: 1.3 mg/dL — AB (ref 0.61–1.24)
Chloride: 106 mmol/L (ref 101–111)
GFR calc Af Amer: >60 mL/min (ref >60)
Glucose, Bld: 123 mg/dL — ABNORMAL HIGH (ref 65–99)
Potassium: 3.7 mmol/L (ref 3.5–5.1)
Sodium: 138 mmol/L (ref 135–145)
Total Bilirubin: 0.4 mg/dL (ref 0.3–1.2)
Total Protein: 7.1 g/dL (ref 6.5–8.1)

## 2016-10-18 MED ORDER — ONDANSETRON HCL 4 MG/2ML IJ SOLN
4.0000 mg | Freq: Once | INTRAMUSCULAR | Status: AC
  Administered 2016-10-18: 4 mg via INTRAVENOUS
  Filled 2016-10-18: qty 2

## 2016-10-18 MED ORDER — SODIUM CHLORIDE 0.9 % IV BOLUS (SEPSIS)
1000.0000 mL | Freq: Once | INTRAVENOUS | Status: AC
Start: 2016-10-18 — End: 2016-10-18
  Administered 2016-10-18: 1000 mL via INTRAVENOUS

## 2016-10-18 MED ORDER — OXYCODONE-ACETAMINOPHEN 5-325 MG PO TABS: 1.0000 | ORAL_TABLET | Freq: Four times a day (QID) | ORAL | 0 refills | Status: DC | PRN

## 2016-10-18 MED ORDER — KETOROLAC TROMETHAMINE 30 MG/ML IJ SOLN
30.0000 mg | Freq: Once | INTRAMUSCULAR | Status: AC
  Administered 2016-10-18: 30 mg via INTRAVENOUS
  Filled 2016-10-18: qty 1

## 2016-10-18 MED ORDER — ONDANSETRON 4 MG PO TBDP: 4.0000 mg | ORAL_TABLET | Freq: Four times a day (QID) | ORAL | 0 refills | Status: DC | PRN

## 2016-10-18 MED ORDER — DEXTROSE 5 % IV SOLN
1.0000 g | Freq: Once | INTRAVENOUS | Status: AC
  Administered 2016-10-18: 1 g via INTRAVENOUS
  Filled 2016-10-18: qty 10

## 2016-10-18 MED ORDER — MORPHINE SULFATE (PF) 4 MG/ML IV SOLN
4.0000 mg | Freq: Once | INTRAVENOUS | Status: AC
  Administered 2016-10-18: 4 mg via INTRAVENOUS
  Filled 2016-10-18: qty 1

## 2016-10-18 MED ORDER — CEPHALEXIN 500 MG PO CAPS: 500.0000 mg | ORAL_CAPSULE | Freq: Two times a day (BID) | ORAL | 0 refills | Status: DC

## 2016-10-18 MED ORDER — TAMSULOSIN HCL 0.4 MG PO CAPS: 0.4000 mg | ORAL_CAPSULE | Freq: Every day | ORAL | 0 refills | Status: DC

## 2016-10-18 NOTE — ED Notes (Signed)
Pt insisting to lay prone on stretcher d/t pain.

## 2016-10-18 NOTE — ED Notes (Signed)
Bed: BB79 Expected date:  Expected time:  Means of arrival:  Comments: 56 yo seizures w/ hx of the same

## 2016-10-18 NOTE — ED Notes (Addendum)
Patient transported to CT 

## 2016-10-18 NOTE — ED Notes (Signed)
ED Provider at bedside. 

## 2016-10-18 NOTE — ED Notes (Signed)
Pt departed in NAD.  

## 2016-10-19 LAB — URINE CULTURE

## 2016-10-31 NOTE — ED Provider Notes (Signed)
[name] Provider Note   CSN: [id number] Arrival date & time: 10/18/16  1316     History   Chief Complaint Chief Complaint  Patient presents with  . Seizures    HPI Keith Dixon is a 56 y.o. male.  HPI Patient presents to the emergency department after witnessed seizure.  Patient has a known seizure disorder.  Is noncompliant with his seizure meds.  No fevers or chills.  No neck pain.  No recent head injury or trauma.  A septal bowing of this time.  No longer postictal.   Past Medical History:  Diagnosis Date  . Anxiety   . Bipolar disorder ([name])   . Depression   . Hypertension   . PTSD (post-traumatic stress disorder)   . Seizures ([name])   . Tobacco use disorder     Patient Active Problem List   Diagnosis Date Noted  . Diarrhea 11/27/2013  . Seizure ([name]) 11/26/2013  . Tooth abscess 11/26/2013  . 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 UTI (lower urinary tract infection) 11/15/2012  . Fever 11/15/2012  . Sepsis ([name]) 11/15/2012  . 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  . Seizure disorder ([name]) 07/11/2011  . Bipolar disorder ([name]) 07/11/2011  . PTSD (post-traumatic stress disorder) 07/11/2011  . Obesity (BMI 30-39.9) 07/11/2011  . Hypertension 07/11/2011  . Smoker 07/11/2011    No past surgical history on file.     Home Medications    Prior to Admission medications   Medication Sig Start Date End Date Taking? Authorizing Provider  amLODipine (NORVASC) 10 MG tablet Take 10 mg by mouth daily.    [provider]  cephALEXin (KEFLEX) 500 MG capsule Take 1 capsule (500 mg total) by mouth 2 (two) times daily. 10/18/16   Ward, [name], DO  Cholecalciferol 2000 UNITS CAPS Take 1 capsule by mouth 2 (two) times daily.    [provider]  ciprofloxacin (CIPRO) 500 MG tablet Take 1 tablet (500 mg total) by mouth 2 (two) times daily. x10 days 11/28/13   Mendel Corning, MD  citalopram  (CELEXA) 40 MG tablet Take 20 mg by mouth daily. Takes one-half of 40mg  tablet to equal 20mg     [provider]  hydrOXYzine (VISTARIL) 50 MG capsule Take 50 mg by mouth 2 (two) times daily as needed for anxiety.    [provider]  lamoTRIgine (LAMICTAL) 200 MG tablet Take 1-1.5 tablets (200-300 mg total) by mouth 2 (two) times daily. Take one (1) tablet in the morning and one and a half tablet in the evening. 11/28/13   [name], [name], MD  levETIRAcetam (KEPPRA XR) 500 MG 24 hr tablet Take 2 tablets (1,000 mg total) by mouth daily. 11/28/13   [name], [name], MD  lisinopril-hydrochlorothiazide (PRINZIDE,ZESTORETIC) 20-12.5 MG per tablet Take 1 tablet by mouth daily. 11/17/13   [name], [name] Eng, PA-C  loperamide (IMODIUM A-D) 2 MG tablet Take 1 tablet (2 mg total) by mouth 4 (four) times daily as needed for diarrhea or loose stools. 11/28/13   [name], [name], MD  methocarbamol (ROBAXIN) 500 MG tablet Take 500 mg by mouth 4 (four) times daily.    [provider]  naproxen (NAPROSYN) 500 MG tablet Take 500 mg by mouth 2 (two) times daily with a meal.    [provider]  ondansetron (ZOFRAN ODT) 4 MG disintegrating tablet Take 1 tablet (4 mg total) by mouth every 6 (six) hours as needed for nausea or vomiting.  10/18/16   Ward, [name], DO  oxyCODONE-acetaminophen (PERCOCET/ROXICET) 5-325 MG tablet Take 1-2 tablets by mouth every 6 (six) hours as needed. 10/18/16   Ward, [name], DO  prazosin (MINIPRESS) 2 MG capsule Take 2 mg by mouth at bedtime.    [provider]  saccharomyces boulardii (FLORASTOR) 250 MG capsule Take 1 capsule (250 mg total) by mouth 2 (two) times daily. While on antibiotics 11/28/13   [name], [name], MD  tamsulosin (FLOMAX) 0.4 MG CAPS capsule Take 1 capsule (0.4 mg total) by mouth daily. Until stone passes 10/18/16   Ward, [name], DO    Family History Family History  Problem Relation Age of Onset  . Seizures Mother   . Breast cancer  Mother 40  . Hypertension Brother     Social History Social History  Substance Use Topics  . Smoking status: Current Every Day Smoker    Packs/day: 1.00    Years: 20.00    Types: Cigarettes  . Smokeless tobacco: Never Used  . Alcohol use No     Comment: once or twice a year     Allergies   Patient has no known allergies.   Review of Systems Review of Systems  All other systems reviewed and are negative.    Physical Exam Updated Vital Signs BP (!) 177/78 (BP Location: Right Arm)   Pulse 75   Temp 99.4 F (37.4 C) (Oral)   Resp 16   Ht 6\' 1"  (1.854 m)   Wt 122.5 kg (270 lb)   SpO2 98%   BMI 35.62 kg/m   Physical Exam  Constitutional: He is oriented to person, place, and time. He appears well-developed and well-nourished.  HENT:  Head: Normocephalic and atraumatic.  Eyes: EOM are normal. Pupils are equal, round, and reactive to light.  Neck: Normal range of motion.  Cardiovascular: Normal rate, regular rhythm and intact distal pulses.   Pulmonary/Chest: Effort normal and breath sounds normal. No respiratory distress.  Abdominal: Soft. He exhibits no distension. There is no tenderness.  Musculoskeletal: Normal range of motion.  Neurological: He is alert and oriented to person, place, and time.  5/5 strength in major muscle groups of  bilateral upper and lower extremities. Speech normal. No facial asymetry.   Skin: Skin is warm and dry.  Nursing note and vitals reviewed.    ED Treatments / Results  Labs (all labs ordered are listed, but only abnormal results are displayed) Labs Reviewed - No data to display  EKG  EKG Interpretation None       Radiology No results found.  Procedures Procedures (including critical care time)  Medications Ordered in ED Medications - No data to display   Initial Impression / Assessment and Plan / ED Course  I have reviewed the triage vital signs and the nursing notes.  Pertinent labs & imaging results that were  available during my care of the patient were reviewed by me and considered in my medical decision making (see chart for details).     Mental status is cleared.  Returned to baseline mental status.  Outpatient neurology follow-up.  Recommended compliance with meds  Final Clinical Impressions(s) / ED Diagnoses   Final diagnoses:  Seizure [facility])    New Prescriptions Discharge Medication List as of 10/18/2016  3:09 PM       Jola Schmidt, MD 10/31/16 0025

## 2019-04-23 ENCOUNTER — Emergency Department (HOSPITAL_COMMUNITY): Payer: No Typology Code available for payment source

## 2019-04-23 ENCOUNTER — Inpatient Hospital Stay (HOSPITAL_COMMUNITY)
Admission: EM | Admit: 2019-04-23 | Discharge: 2019-04-25 | DRG: 206 | Disposition: A | Discharge status: Home / Self Care | Payer: No Typology Code available for payment source | Attending: Surgery | Admitting: Surgery

## 2019-04-23 ENCOUNTER — Other Ambulatory Visit: Payer: Self-pay

## 2019-04-23 DIAGNOSIS — S2241XA Multiple fractures of ribs, right side, initial encounter for closed fracture: Secondary | ICD-10-CM

## 2019-04-23 DIAGNOSIS — S2239XA Fracture of one rib, unspecified side, initial encounter for closed fracture: Secondary | ICD-10-CM

## 2019-04-23 DIAGNOSIS — G40909 Epilepsy, unspecified, not intractable, without status epilepticus: Secondary | ICD-10-CM

## 2019-04-23 DIAGNOSIS — T1490XA Injury, unspecified, initial encounter: Secondary | ICD-10-CM

## 2019-04-23 DIAGNOSIS — S27321A Contusion of lung, unilateral, initial encounter: Principal | ICD-10-CM

## 2019-04-23 DIAGNOSIS — R0902 Hypoxemia: Secondary | ICD-10-CM

## 2019-04-23 DIAGNOSIS — Y92410 Unspecified street and highway as the place of occurrence of the external cause: Secondary | ICD-10-CM | POA: Diagnosis not present

## 2019-04-23 DIAGNOSIS — Z20828 Contact with and (suspected) exposure to other viral communicable diseases: Secondary | ICD-10-CM | POA: Diagnosis present

## 2019-04-23 DIAGNOSIS — I10 Essential (primary) hypertension: Secondary | ICD-10-CM | POA: Diagnosis present

## 2019-04-23 DIAGNOSIS — Z79899 Other long term (current) drug therapy: Secondary | ICD-10-CM | POA: Diagnosis not present

## 2019-04-23 DIAGNOSIS — F4310 Post-traumatic stress disorder, unspecified: Secondary | ICD-10-CM | POA: Diagnosis present

## 2019-04-23 DIAGNOSIS — G40919 Epilepsy, unspecified, intractable, without status epilepticus: Secondary | ICD-10-CM | POA: Diagnosis present

## 2019-04-23 DIAGNOSIS — R110 Nausea: Secondary | ICD-10-CM | POA: Diagnosis not present

## 2019-04-23 HISTORY — DX: Nicotine dependence, unspecified, uncomplicated: F17.200

## 2019-04-23 HISTORY — DX: Bipolar disorder, unspecified: F31.9

## 2019-04-23 HISTORY — DX: Personal history of other mental and behavioral disorders: Z86.59

## 2019-04-23 LAB — URINALYSIS, ROUTINE W REFLEX MICROSCOPIC
Bilirubin Urine: NEGATIVE
Glucose, UA: NEGATIVE mg/dL
Hgb urine dipstick: NEGATIVE
Ketones, ur: NEGATIVE mg/dL
Nitrite: NEGATIVE
Protein, ur: NEGATIVE mg/dL
Specific Gravity, Urine: 1.021 (ref 1.005–1.030)
pH: 7 (ref 5.0–8.0)

## 2019-04-23 LAB — COMPREHENSIVE METABOLIC PANEL
ALT: 24 U/L (ref 0–44)
AST: 33 U/L (ref 15–41)
Albumin: 3.9 g/dL (ref 3.5–5.0)
Alkaline Phosphatase: 65 U/L (ref 38–126)
Anion gap: 12 (ref 5–15)
BUN: 20 mg/dL (ref 6–20)
CO2: 24 mmol/L (ref 22–32)
Calcium: 9 mg/dL (ref 8.9–10.3)
Chloride: 104 mmol/L (ref 98–111)
Creatinine, Ser: 1.45 mg/dL — ABNORMAL HIGH (ref 0.61–1.24)
GFR calc Af Amer: >60 mL/min (ref >60)
GFR calc non Af Amer: 53 mL/min — ABNORMAL LOW (ref >60)
Glucose, Bld: 94 mg/dL (ref 70–99)
Potassium: 3.8 mmol/L (ref 3.5–5.1)
Sodium: 140 mmol/L (ref 135–145)
Total Bilirubin: 0.9 mg/dL (ref 0.3–1.2)
Total Protein: 6.4 g/dL — ABNORMAL LOW (ref 6.5–8.1)

## 2019-04-23 LAB — CBC
HCT: 42.2 % (ref 39.0–52.0)
Hemoglobin: 13.8 g/dL (ref 13.0–17.0)
MCH: 27.1 pg (ref 26.0–34.0)
MCHC: 32.7 g/dL (ref 30.0–36.0)
MCV: 82.9 fL (ref 80.0–100.0)
Platelets: 263 10*3/uL (ref 150–400)
RBC: 5.09 MIL/uL (ref 4.22–5.81)
RDW: 17.3 % — ABNORMAL HIGH (ref 11.5–15.5)
WBC: 7.8 10*3/uL (ref 4.0–10.5)
nRBC: 0 % (ref 0.0–0.2)

## 2019-04-23 LAB — PROTIME-INR
INR: 1 (ref 0.8–1.2)
Prothrombin Time: 13.5 seconds (ref 11.4–15.2)

## 2019-04-23 LAB — SAMPLE TO BLOOD BANK

## 2019-04-23 LAB — CDS SEROLOGY

## 2019-04-23 LAB — SARS CORONAVIRUS 2 (TAT 6-24 HRS): SARS Coronavirus 2: NEGATIVE

## 2019-04-23 LAB — LACTIC ACID, PLASMA: Lactic Acid, Venous: 1.4 mmol/L (ref 0.5–1.9)

## 2019-04-23 LAB — CBG MONITORING, ED: Glucose-Capillary: 98 mg/dL (ref 70–99)

## 2019-04-23 LAB — ETHANOL: Alcohol, Ethyl (B): <10 mg/dL (ref <10)

## 2019-04-23 MED ORDER — DOCUSATE SODIUM 100 MG PO CAPS
100.0000 mg | ORAL_CAPSULE | Freq: Two times a day (BID) | ORAL | Status: DC
  Administered 2019-04-24 – 2019-04-25 (×3): 100 mg via ORAL
  Filled 2019-04-23 (×3): qty 1

## 2019-04-23 MED ORDER — LAMOTRIGINE 25 MG PO TABS
200.0000 mg | ORAL_TABLET | Freq: Every day | ORAL | Status: DC
  Administered 2019-04-24 – 2019-04-25 (×2): 200 mg via ORAL
  Filled 2019-04-23 (×2): qty 8

## 2019-04-23 MED ORDER — SODIUM CHLORIDE 0.9 % IV SOLN
2000.0000 mg | Freq: Once | INTRAVENOUS | Status: AC
  Administered 2019-04-23: 2000 mg via INTRAVENOUS
  Filled 2019-04-23: qty 20

## 2019-04-23 MED ORDER — HYDRALAZINE HCL 10 MG PO TABS
10.0000 mg | ORAL_TABLET | Freq: Three times a day (TID) | ORAL | Status: DC | PRN
  Filled 2019-04-23: qty 1

## 2019-04-23 MED ORDER — ACETAMINOPHEN 500 MG PO TABS
1000.0000 mg | ORAL_TABLET | Freq: Four times a day (QID) | ORAL | Status: DC
  Administered 2019-04-23 – 2019-04-25 (×6): 1000 mg via ORAL
  Filled 2019-04-23 (×8): qty 2

## 2019-04-23 MED ORDER — ENOXAPARIN SODIUM 40 MG/0.4ML ~~LOC~~ SOLN
40.0000 mg | SUBCUTANEOUS | Status: DC
  Administered 2019-04-24 – 2019-04-25 (×2): 40 mg via SUBCUTANEOUS
  Filled 2019-04-23 (×2): qty 0.4

## 2019-04-23 MED ORDER — LEVETIRACETAM IN NACL 500 MG/100ML IV SOLN
500.0000 mg | Freq: Two times a day (BID) | INTRAVENOUS | Status: DC
  Administered 2019-04-24 – 2019-04-25 (×3): 500 mg via INTRAVENOUS
  Filled 2019-04-23 (×3): qty 100

## 2019-04-23 MED ORDER — OXYCODONE HCL 5 MG PO TABS
5.0000 mg | ORAL_TABLET | ORAL | Status: DC | PRN
  Administered 2019-04-24 (×2): 5 mg via ORAL
  Filled 2019-04-23 (×2): qty 1

## 2019-04-23 MED ORDER — FENTANYL CITRATE (PF) 100 MCG/2ML IJ SOLN
50.0000 ug | Freq: Once | INTRAMUSCULAR | Status: AC
  Administered 2019-04-23: 50 ug via INTRAVENOUS
  Filled 2019-04-23: qty 2

## 2019-04-23 MED ORDER — GABAPENTIN 600 MG PO TABS
600.0000 mg | ORAL_TABLET | Freq: Two times a day (BID) | ORAL | Status: DC
  Administered 2019-04-23: 600 mg via ORAL
  Filled 2019-04-23: qty 1

## 2019-04-23 MED ORDER — OXYCODONE HCL 5 MG PO TABS
10.0000 mg | ORAL_TABLET | ORAL | Status: DC | PRN
  Administered 2019-04-23: 10 mg via ORAL
  Filled 2019-04-23 (×2): qty 2

## 2019-04-23 MED ORDER — METHOCARBAMOL 500 MG PO TABS: 500.0000 mg | ORAL_TABLET | Freq: Three times a day (TID) | ORAL | Status: DC | PRN

## 2019-04-23 MED ORDER — ONDANSETRON 4 MG PO TBDP: 4.0000 mg | ORAL_TABLET | Freq: Four times a day (QID) | ORAL | Status: DC | PRN

## 2019-04-23 MED ORDER — LEVETIRACETAM IN NACL 500 MG/100ML IV SOLN
500.0000 mg | Freq: Two times a day (BID) | INTRAVENOUS | Status: DC
  Filled 2019-04-23: qty 100

## 2019-04-23 MED ORDER — BISACODYL 10 MG RE SUPP: 10.0000 mg | Freq: Every day | RECTAL | Status: DC | PRN

## 2019-04-23 MED ORDER — SODIUM CHLORIDE 0.9 % IV SOLN
INTRAVENOUS | Status: DC
  Administered 2019-04-24 (×2): via INTRAVENOUS

## 2019-04-23 MED ORDER — TETANUS-DIPHTH-ACELL PERTUSSIS 5-2.5-18.5 LF-MCG/0.5 IM SUSP
0.5000 mL | Freq: Once | INTRAMUSCULAR | Status: AC
  Administered 2019-04-23: 0.5 mL via INTRAMUSCULAR
  Filled 2019-04-23: qty 0.5

## 2019-04-23 MED ORDER — SODIUM CHLORIDE 0.9 % IV BOLUS
1000.0000 mL | Freq: Once | INTRAVENOUS | Status: AC
  Administered 2019-04-23: 1000 mL via INTRAVENOUS

## 2019-04-23 MED ORDER — METOPROLOL TARTRATE 5 MG/5ML IV SOLN: 5.0000 mg | Freq: Four times a day (QID) | INTRAVENOUS | Status: DC | PRN

## 2019-04-23 MED ORDER — MORPHINE SULFATE (PF) 2 MG/ML IV SOLN
1.0000 mg | INTRAVENOUS | Status: DC | PRN
  Administered 2019-04-23 – 2019-04-25 (×2): 1 mg via INTRAVENOUS
  Filled 2019-04-23 (×2): qty 1

## 2019-04-23 MED ORDER — LAMOTRIGINE 150 MG PO TABS
300.0000 mg | ORAL_TABLET | Freq: Every day | ORAL | Status: DC
  Administered 2019-04-23 – 2019-04-24 (×2): 300 mg via ORAL
  Filled 2019-04-23 (×3): qty 2

## 2019-04-23 MED ORDER — ONDANSETRON HCL 4 MG/2ML IJ SOLN
4.0000 mg | Freq: Four times a day (QID) | INTRAMUSCULAR | Status: DC | PRN
  Administered 2019-04-23: 4 mg via INTRAVENOUS
  Filled 2019-04-23: qty 2

## 2019-04-23 MED ORDER — IOHEXOL 300 MG/ML  SOLN
100.0000 mL | Freq: Once | INTRAMUSCULAR | Status: AC | PRN
  Administered 2019-04-23: 100 mL via INTRAVENOUS
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
## 2019-04-23 NOTE — ED Notes (Signed)
Patient transported to CT 
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
## 2019-04-23 NOTE — ED Notes (Signed)
Pt stood on side of bed to use urinal with assistance
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
## 2019-04-23 NOTE — ED Notes (Signed)
Attempted to call pts daughter, no response

## 2019-04-23 NOTE — ED Notes (Signed)
2L [place] applied due to oxygen saturation dropping to 91% on room air

## 2019-04-24 LAB — BASIC METABOLIC PANEL
Anion gap: 12 (ref 5–15)
BUN: 15 mg/dL (ref 6–20)
CO2: 22 mmol/L (ref 22–32)
Calcium: 9 mg/dL (ref 8.9–10.3)
Chloride: 105 mmol/L (ref 98–111)
Creatinine, Ser: 1.21 mg/dL (ref 0.61–1.24)
GFR calc Af Amer: >60 mL/min (ref >60)
GFR calc non Af Amer: >60 mL/min (ref >60)
Glucose, Bld: 112 mg/dL — ABNORMAL HIGH (ref 70–99)
Potassium: 3.8 mmol/L (ref 3.5–5.1)
Sodium: 139 mmol/L (ref 135–145)

## 2019-04-24 LAB — CBC
HCT: 40.1 % (ref 39.0–52.0)
Hemoglobin: 13.2 g/dL (ref 13.0–17.0)
MCH: 27 pg (ref 26.0–34.0)
MCHC: 32.9 g/dL (ref 30.0–36.0)
MCV: 82 fL (ref 80.0–100.0)
Platelets: 239 10*3/uL (ref 150–400)
RBC: 4.89 MIL/uL (ref 4.22–5.81)
RDW: 16 % — ABNORMAL HIGH (ref 11.5–15.5)
WBC: 11 10*3/uL — ABNORMAL HIGH (ref 4.0–10.5)
nRBC: 0 % (ref 0.0–0.2)

## 2019-04-24 MED ORDER — BACLOFEN 10 MG PO TABS
10.0000 mg | ORAL_TABLET | Freq: Two times a day (BID) | ORAL | Status: DC | PRN
  Filled 2019-04-24: qty 1

## 2019-04-24 MED ORDER — AMLODIPINE BESYLATE 10 MG PO TABS
10.0000 mg | ORAL_TABLET | Freq: Every day | ORAL | Status: DC
  Administered 2019-04-24 – 2019-04-25 (×2): 10 mg via ORAL
  Filled 2019-04-24 (×2): qty 1

## 2019-04-24 MED ORDER — GABAPENTIN 300 MG PO CAPS
600.0000 mg | ORAL_CAPSULE | Freq: Two times a day (BID) | ORAL | Status: DC
  Administered 2019-04-24 – 2019-04-25 (×3): 600 mg via ORAL
  Filled 2019-04-24 (×3): qty 2

## 2019-04-24 MED ORDER — QUETIAPINE FUMARATE 100 MG PO TABS
200.0000 mg | ORAL_TABLET | Freq: Every day | ORAL | Status: DC
  Administered 2019-04-24: 200 mg via ORAL
  Filled 2019-04-24: qty 2

## 2019-04-24 MED ORDER — HYDROCHLOROTHIAZIDE 12.5 MG PO CAPS
12.5000 mg | ORAL_CAPSULE | Freq: Every day | ORAL | Status: DC
  Administered 2019-04-24 – 2019-04-25 (×2): 12.5 mg via ORAL
  Filled 2019-04-24 (×2): qty 1

## 2019-04-24 MED ORDER — LISINOPRIL 10 MG PO TABS
20.0000 mg | ORAL_TABLET | Freq: Every day | ORAL | Status: DC
  Administered 2019-04-24 – 2019-04-25 (×2): 20 mg via ORAL
  Filled 2019-04-24 (×2): qty 2

## 2019-04-24 MED ORDER — SERTRALINE HCL 100 MG PO TABS
100.0000 mg | ORAL_TABLET | Freq: Every day | ORAL | Status: DC
  Administered 2019-04-24 – 2019-04-25 (×2): 100 mg via ORAL
  Filled 2019-04-24 (×2): qty 1

## 2019-04-24 MED ORDER — LISINOPRIL-HYDROCHLOROTHIAZIDE 20-12.5 MG PO TABS: 1.0000 | ORAL_TABLET | Freq: Every day | ORAL | Status: DC
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
## 2019-04-25 ENCOUNTER — Encounter (HOSPITAL_COMMUNITY): Payer: Self-pay

## 2019-04-25 ENCOUNTER — Inpatient Hospital Stay (HOSPITAL_COMMUNITY): Payer: No Typology Code available for payment source

## 2019-04-25 DIAGNOSIS — G40909 Epilepsy, unspecified, not intractable, without status epilepticus: Secondary | ICD-10-CM

## 2019-04-25 LAB — COMPREHENSIVE METABOLIC PANEL
ALT: 18 U/L (ref 0–44)
AST: 22 U/L (ref 15–41)
Albumin: 3.4 g/dL — ABNORMAL LOW (ref 3.5–5.0)
Alkaline Phosphatase: 55 U/L (ref 38–126)
Anion gap: 10 (ref 5–15)
BUN: 12 mg/dL (ref 6–20)
CO2: 24 mmol/L (ref 22–32)
Calcium: 8.7 mg/dL — ABNORMAL LOW (ref 8.9–10.3)
Chloride: 103 mmol/L (ref 98–111)
Creatinine, Ser: 1.17 mg/dL (ref 0.61–1.24)
GFR calc Af Amer: >60 mL/min (ref >60)
GFR calc non Af Amer: >60 mL/min (ref >60)
Glucose, Bld: 100 mg/dL — ABNORMAL HIGH (ref 70–99)
Potassium: 3.4 mmol/L — ABNORMAL LOW (ref 3.5–5.1)
Sodium: 137 mmol/L (ref 135–145)
Total Bilirubin: 0.5 mg/dL (ref 0.3–1.2)
Total Protein: 5.9 g/dL — ABNORMAL LOW (ref 6.5–8.1)

## 2019-04-25 LAB — CBC
HCT: 36.1 % — ABNORMAL LOW (ref 39.0–52.0)
Hemoglobin: 11.8 g/dL — ABNORMAL LOW (ref 13.0–17.0)
MCH: 27.1 pg (ref 26.0–34.0)
MCHC: 32.7 g/dL (ref 30.0–36.0)
MCV: 83 fL (ref 80.0–100.0)
Platelets: 192 10*3/uL (ref 150–400)
RBC: 4.35 MIL/uL (ref 4.22–5.81)
RDW: 15.9 % — ABNORMAL HIGH (ref 11.5–15.5)
WBC: 6.2 10*3/uL (ref 4.0–10.5)
nRBC: 0 % (ref 0.0–0.2)

## 2019-04-25 LAB — PHOSPHORUS: Phosphorus: 3.4 mg/dL (ref 2.5–4.6)

## 2019-04-25 LAB — MAGNESIUM: Magnesium: 2 mg/dL (ref 1.7–2.4)

## 2019-04-25 LAB — LAMOTRIGINE LEVEL
Lamotrigine Lvl: 3.8 ug/mL (ref 2.0–20.0)
Lamotrigine Lvl: 5.2 ug/mL (ref 2.0–20.0)

## 2019-04-25 MED ORDER — OXYCODONE HCL 5 MG PO TABS: 5.0000 mg | ORAL_TABLET | Freq: Four times a day (QID) | ORAL | 0 refills | Status: DC | PRN

## 2019-04-25 MED ORDER — LEVETIRACETAM 500 MG PO TABS: 1500.0000 mg | ORAL_TABLET | Freq: Two times a day (BID) | ORAL | Status: DC

## 2019-04-25 MED ORDER — POTASSIUM CHLORIDE 20 MEQ/15ML (10%) PO SOLN
40.0000 meq | Freq: Once | ORAL | Status: AC
  Administered 2019-04-25: 40 meq via ORAL
  Filled 2019-04-25: qty 30

## 2019-04-25 MED ORDER — OXYCODONE HCL 5 MG PO TABS: 5.0000 mg | ORAL_TABLET | ORAL | Status: DC | PRN

## 2019-04-25 MED ORDER — LEVETIRACETAM 500 MG PO TABS
1000.0000 mg | ORAL_TABLET | Freq: Once | ORAL | Status: AC
  Administered 2019-04-25: 1000 mg via ORAL
  Filled 2019-04-25: qty 2

## 2019-04-25 MED ORDER — ACETAMINOPHEN 500 MG PO TABS: 1000.0000 mg | ORAL_TABLET | Freq: Four times a day (QID) | ORAL | 0 refills | Status: AC | PRN

## 2019-04-25 MED ORDER — METHOCARBAMOL 500 MG PO TABS
500.0000 mg | ORAL_TABLET | Freq: Three times a day (TID) | ORAL | Status: DC
  Administered 2019-04-25: 500 mg via ORAL
  Filled 2019-04-25: qty 1

## 2019-04-25 MED FILL — oxyCODONE HCL 5 MG TABS: 5 | 4 days supply | Qty: 15 | Fill #0
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
## 2019-04-25 NOTE — Progress Notes (Signed)
Patient in a stable condition, discharged education reviewed with patient he verbalized understanding , iv removed, tele dc ccmd notified, patient belongings at bedside, patient to be transported home by his daughter.

## 2019-04-25 NOTE — Progress Notes (Signed)
Reason for consult: Seizure   Subjective: patient back to baseline, no further seizure since admission.    ROS: negative except above  Examination  Vital signs in last 24 hours: Temp:  [97.9 F (36.6 C)-98.3 F (36.8 C)] 98.2 F (36.8 C) (12/14 0810) Pulse Rate:  [61-64] 64 (12/14 0810) Resp:  [17-23] 20 (12/14 0810) BP: (123-149)/(59-82) 148/82 (12/14 0810) SpO2:  [95 %-98 %] 96 % (12/14 0810)  General: lying in bed CVS: pulse-normal rate and rhythm RS: breathing comfortably Extremities: normal   Neuro: MS: Alert, oriented, follows commands CN: pupils equal and reactive,  EOMI, face symmetric, tongue midline, normal sensation over face, Motor: 5/5 strength in all 4 extremities Reflexes: 2+ bilaterally over patella, biceps, plantars: flexor Coordination: normal Gait: not tested  Basic Metabolic Panel: Recent Labs  Lab 04/23/19 1701 04/24/19 0229 04/25/19 0316  NA 140 139 137  K 3.8 3.8 3.4*  CL 104 105 103  CO2 24 22 24   GLUCOSE 94 112* 100*  BUN 20 15 12   CREATININE 1.45* 1.21 1.17  CALCIUM 9.0 9.0 8.7*  MG  --   --  2.0  PHOS  --   --  3.4    CBC: Recent Labs  Lab 04/23/19 1540 04/24/19 0229 04/25/19 0316  WBC 7.8 11.0* 6.2  HGB 13.8 13.2 11.8*  HCT 42.2 40.1 36.1*  MCV 82.9 82.0 83.0  PLT 263 239 192     Coagulation Studies: Recent Labs    04/23/19 1703  LABPROT 13.5  INR 1.0    Imaging Reviewed:     ASSESSMENT AND PLAN  Breakthrough Seizure Refractory epilepsy  Recommendations  Continue current home medications Seizure precautions including no driving x 70months Lamotrigine level ordered, pending and can be followed up as outpatient, can assist his neurologist when titrating medication   Per [facility] statutes, patients with seizures are not allowed to drive until they have been seizure-free for six months. Use caution when using heavy equipment or power tools. Avoid working on ladders or at heights. Take showers  instead of baths. Ensure the water temperature is not too high on the home water heater. Do not go swimming alone. Do not lock yourself in a room alone (i.e. bathroom). When caring for infants or small children, sit down when holding, feeding, or changing them to minimize risk of injury to the child in the event you have a seizure. Maintain good sleep hygiene. Avoid alcohol.    If Sim Trundle has another seizure, call 911 and bring them back to the ED if:       A.  The seizure lasts longer than 5 minutes.            B.  The patient doesn't wake shortly after the seizure or has new problems such as difficulty seeing, speaking or moving following the seizure       C.  The patient was injured during the seizure       D.  The patient has a temperature over 102 F (39C)       E.  The patient vomited during the seizure and now is having trouble breathing    Karena Addison Quron Ruddy [facility] Pager Number [phone] For questions after 7pm please refer to AMION to reach the Neurologist on call

## 2019-04-25 NOTE — Plan of Care (Signed)
  Problem: Education: Goal: Knowledge of General Education information will improve Description: Including pain rating scale, medication(s)/side effects and non-pharmacologic comfort measures Outcome: Completed/Met   Problem: Health Behavior/Discharge Planning: Goal: Ability to manage health-related needs will improve Outcome: Completed/Met   Problem: Clinical Measurements: Goal: Ability to maintain clinical measurements within normal limits will improve Outcome: Completed/Met Goal: Will remain free from infection Outcome: Completed/Met Goal: Diagnostic test results will improve Outcome: Completed/Met Goal: Respiratory complications will improve Outcome: Completed/Met Goal: Cardiovascular complication will be avoided Outcome: Completed/Met   Problem: Activity: Goal: Risk for activity intolerance will decrease Outcome: Completed/Met   Problem: Nutrition: Goal: Adequate nutrition will be maintained Outcome: Completed/Met   Problem: Coping: Goal: Level of anxiety will decrease Outcome: Completed/Met   Problem: Elimination: Goal: Will not experience complications related to bowel motility Outcome: Completed/Met   Problem: Pain Managment: Goal: General experience of comfort will improve Outcome: Completed/Met   Problem: Safety: Goal: Ability to remain free from injury will improve Outcome: Completed/Met   Problem: Skin Integrity: Goal: Risk for impaired skin integrity will decrease Outcome: Completed/Met   Problem: Education: Goal: Expressions of having a comfortable level of knowledge regarding the disease process will increase Outcome: Completed/Met   Problem: Coping: Goal: Ability to adjust to condition or change in health will improve Outcome: Completed/Met Goal: Ability to identify appropriate support needs will improve Outcome: Completed/Met   Problem: Health Behavior/Discharge Planning: Goal: Compliance with prescribed medication regimen will  improve Outcome: Completed/Met   Problem: Medication: Goal: Risk for medication side effects will decrease Outcome: Completed/Met   Problem: Clinical Measurements: Goal: Complications related to the disease process, condition or treatment will be avoided or minimized Outcome: Completed/Met Goal: Diagnostic test results will improve Outcome: Completed/Met   Problem: Safety: Goal: Verbalization of understanding the information provided will improve Outcome: Completed/Met

## 2019-04-25 NOTE — Discharge Instructions (Addendum)
Per [facility] statutes, patients with seizures are not allowed to drive until they have been seizure-free for six months. Use caution when using heavy equipment or power tools. Avoid working on ladders or at heights. Take showers instead of baths. Ensure the water temperature is not too high on the home water heater. Do not go swimming alone. Do not lock yourself in a room alone (i.e. bathroom). When caring for infants or small children, sit down when holding, feeding, or changing them to minimize risk of injury to the child in the event you have a seizure. Maintain good sleep hygiene. Avoid alcohol.    If Keith Dixon has another seizure, call 911 and bring them back to the ED if: A. The seizure lasts longer than 5 minutes.  B. The patient doesn't wake shortly after the seizure or has new problems such as difficulty seeing, speaking or moving following the seizure C. The patient was injured during the seizure D. The patient has a temperature over 102 F (39C) E. The patient vomited during the seizure and now is having trouble breathing   RIB FRACTURES  HOME INSTRUCTIONS   1. PAIN CONTROL:  1. Pain is best controlled by a usual combination of three different methods TOGETHER:  i. Ice/Heat ii. Over the counter pain medication iii. Prescription pain medication 2. You may experience some swelling and bruising in area of broken ribs. Ice packs or heating pads (30-60 minutes up to 6 times a day) will help. Use ice for the first few days to help decrease swelling and bruising, then switch to heat to help relax tight/sore spots and speed recovery. Some people prefer to use ice alone, heat alone, alternating between ice & heat. Experiment to what works for you. Swelling and bruising can take several weeks to resolve.  3. It is helpful to take an over-the-counter pain medication regularly for the first few weeks. Choose one of the following that works  best for you:  i. Naproxen (Aleve, etc) Two 220mg  tabs twice a day ii. Ibuprofen (Advil, etc) Three 200mg  tabs four times a day (every meal & bedtime) iii. Acetaminophen (Tylenol, etc) 500-650mg  four times a day (every meal & bedtime) 4. A prescription for pain medication (such as oxycodone, hydrocodone, etc) may be given to you upon discharge. Take your pain medication as prescribed.  i. If you are having problems/concerns with the prescription medicine (does not control pain, nausea, vomiting, rash, itching, etc), please call us [phone] to see if we need to switch you to a different pain medicine that will work better for you and/or control your side effect better. ii. If you need a refill on your pain medication, please contact your pharmacy. They will contact our office to request authorization. Prescriptions will not be filled after 5 pm or on week-ends. 1. Avoid getting constipated. When taking pain medications, it is common to experience some constipation. Increasing fluid intake and taking a fiber supplement (such as Metamucil, Citrucel, FiberCon, MiraLax, etc) 1-2 times a day regularly will usually help prevent this problem from occurring. A mild laxative (prune juice, Milk of Magnesia, MiraLax, etc) should be taken according to package directions if there are no bowel movements after 48 hours.  2. Watch out for diarrhea. If you have many loose bowel movements, simplify your diet to bland foods & liquids for a few days. Stop any stool softeners and decrease your fiber supplement. Switching to mild anti-diarrheal medications (Kayopectate, Pepto Bismol) can help. If this worsens  or does not improve, please call us. 3. FOLLOW UP  a. If a follow up appointment is needed one will be scheduled for you. If none is needed with our trauma team, please follow up with your primary care provider within 2-3 weeks from discharge. Please call [facility] at [phone] if you have any questions about follow  up.  b. If you have any orthopedic or other injuries you will need to follow up as outlined in your follow up instructions.   WHEN TO CALL us [phone]:  1. Poor pain control 2. Reactions / problems with new medications (rash/itching, nausea, etc)  3. Fever over 101.5 F (38.5 C) 4. Worsening swelling or bruising 5. Worsening pain, productive cough, difficulty breathing or any other concerning symptoms  The clinic staff is available to answer your questions during regular business hours (8:30am-5pm). Please don't hesitate to call and ask to speak to one of our nurses for clinical concerns.  If you have a medical emergency, go to the nearest emergency room or call 911.  A surgeon from [facility] is always on call at the [facility], [place], [place], [place], [place] [address] ?  MAIN: [phone] ? TOLL FREE: [phone] ?  FAX [phone]  [email or web address]      Information on Rib Fractures  A rib fracture is a break or crack in one of the bones of the ribs. The ribs are long, curved bones that wrap around your chest and attach to your spine and your breastbone. The ribs protect your heart, lungs, and other organs in the chest. A broken or cracked rib is often painful but is not usually serious. Most rib fractures heal on their own over time. However, rib fractures can be more serious if multiple ribs are broken or if broken ribs move out of place and push against other structures or organs. What are the causes? This condition is caused by:  Repetitive movements with high force, such as pitching a baseball or having severe coughing spells.  A direct blow to the chest, such as a sports injury, a car accident, or a fall.  Cancer that has spread to the bones, which can weaken bones and cause them to break. What are the signs or symptoms? Symptoms of this condition include:  Pain when you breathe in  or cough.  Pain when someone presses on the injured area.  Feeling short of breath. How is this diagnosed? This condition is diagnosed with a physical exam and medical history. Imaging tests may also be done, such as:  Chest X-ray.  CT scan.  MRI.  Bone scan.  Chest ultrasound. How is this treated? Treatment for this condition depends on the severity of the fracture. Most rib fractures usually heal on their own in 1-3 months. Sometimes healing takes longer if there is a cough that does not stop or if there are other activities that make the injury worse (aggravating factors). While you heal, you will be given medicines to control the pain. You will also be taught deep breathing exercises. Severe injuries may require hospitalization or surgery. Follow these instructions at home: Managing pain, stiffness, and swelling  If directed, apply ice to the injured area. ? Put ice in a plastic bag. ? Place a towel between your skin and the bag. ? Leave the ice on for 20 minutes, 2-3 times a day.  Take over-the-counter and prescription medicines only as told by  your health care provider. Activity  Avoid a lot of activity and any activities or movements that cause pain. Be careful during activities and avoid bumping the injured rib.  Slowly increase your activity as told by your health care provider. General instructions  Do deep breathing exercises as told by your health care provider. This helps prevent pneumonia, which is a common complication of a broken rib. Your health care provider may instruct you to: ? Take deep breaths several times a day. ? Try to cough several times a day, holding a pillow against the injured area. ? Use a device called incentive spirometer to practice deep breathing several times a day.  Drink enough fluid to keep your urine pale yellow.  Do not wear a rib belt or binder. These restrict breathing, which can lead to pneumonia.  Keep all follow-up visits as  told by your health care provider. This is important. Contact a health care provider if:  You have a fever. Get help right away if:  You have difficulty breathing or you are short of breath.  You develop a cough that does not stop, or you cough up thick or bloody sputum.  You have nausea, vomiting, or pain in your abdomen.  Your pain gets worse and medicine does not help. Summary  A rib fracture is a break or crack in one of the bones of the ribs.  A broken or cracked rib is often painful but is not usually serious.  Most rib fractures heal on their own over time.  Treatment for this condition depends on the severity of the fracture.  Avoid a lot of activity and any activities or movements that cause pain. This information is not intended to replace advice given to you by your health care provider. Make sure you discuss any questions you have with your health care provider. Document Released: 04/28/2005 Document Revised: 07/28/2016 Document Reviewed: 07/28/2016 Elsevier Interactive Patient Education  2019 [employer].

## 2019-04-26 ENCOUNTER — Encounter (HOSPITAL_COMMUNITY): Payer: Self-pay | Admitting: Emergency Medicine

## 2020-09-08 ENCOUNTER — Inpatient Hospital Stay (HOSPITAL_COMMUNITY)
Admission: EM | Admit: 2020-09-08 | Discharge: 2020-09-14 | DRG: 101 | Disposition: A | Discharge status: Home Health | Payer: No Typology Code available for payment source | Attending: Family Medicine | Admitting: Family Medicine

## 2020-09-08 ENCOUNTER — Encounter (HOSPITAL_COMMUNITY): Payer: Self-pay

## 2020-09-08 ENCOUNTER — Emergency Department (HOSPITAL_COMMUNITY): Payer: No Typology Code available for payment source

## 2020-09-08 DIAGNOSIS — A419 Sepsis, unspecified organism: Secondary | ICD-10-CM

## 2020-09-08 DIAGNOSIS — F17200 Nicotine dependence, unspecified, uncomplicated: Secondary | ICD-10-CM | POA: Diagnosis not present

## 2020-09-08 DIAGNOSIS — M48061 Spinal stenosis, lumbar region without neurogenic claudication: Secondary | ICD-10-CM | POA: Diagnosis present

## 2020-09-08 DIAGNOSIS — R9389 Abnormal findings on diagnostic imaging of other specified body structures: Secondary | ICD-10-CM | POA: Diagnosis not present

## 2020-09-08 DIAGNOSIS — F4310 Post-traumatic stress disorder, unspecified: Secondary | ICD-10-CM | POA: Diagnosis present

## 2020-09-08 DIAGNOSIS — F319 Bipolar disorder, unspecified: Secondary | ICD-10-CM | POA: Diagnosis present

## 2020-09-08 DIAGNOSIS — F17210 Nicotine dependence, cigarettes, uncomplicated: Secondary | ICD-10-CM | POA: Diagnosis present

## 2020-09-08 DIAGNOSIS — G40909 Epilepsy, unspecified, not intractable, without status epilepticus: Secondary | ICD-10-CM

## 2020-09-08 DIAGNOSIS — R6510 Systemic inflammatory response syndrome (SIRS) of non-infectious origin without acute organ dysfunction: Secondary | ICD-10-CM

## 2020-09-08 DIAGNOSIS — Z9114 Patient's other noncompliance with medication regimen: Secondary | ICD-10-CM

## 2020-09-08 DIAGNOSIS — F445 Conversion disorder with seizures or convulsions: Secondary | ICD-10-CM | POA: Diagnosis not present

## 2020-09-08 DIAGNOSIS — E876 Hypokalemia: Secondary | ICD-10-CM | POA: Diagnosis not present

## 2020-09-08 DIAGNOSIS — G8929 Other chronic pain: Secondary | ICD-10-CM | POA: Diagnosis present

## 2020-09-08 DIAGNOSIS — Z20822 Contact with and (suspected) exposure to covid-19: Secondary | ICD-10-CM | POA: Diagnosis present

## 2020-09-08 DIAGNOSIS — G40A11 Absence epileptic syndrome, intractable, with status epilepticus: Secondary | ICD-10-CM | POA: Diagnosis not present

## 2020-09-08 DIAGNOSIS — Z79899 Other long term (current) drug therapy: Secondary | ICD-10-CM

## 2020-09-08 DIAGNOSIS — R509 Fever, unspecified: Secondary | ICD-10-CM

## 2020-09-08 DIAGNOSIS — I10 Essential (primary) hypertension: Secondary | ICD-10-CM

## 2020-09-08 DIAGNOSIS — M6088 Other myositis, other site: Secondary | ICD-10-CM | POA: Diagnosis present

## 2020-09-08 DIAGNOSIS — D6959 Other secondary thrombocytopenia: Secondary | ICD-10-CM | POA: Diagnosis present

## 2020-09-08 DIAGNOSIS — R569 Unspecified convulsions: Secondary | ICD-10-CM | POA: Diagnosis not present

## 2020-09-08 DIAGNOSIS — G40901 Epilepsy, unspecified, not intractable, with status epilepticus: Secondary | ICD-10-CM | POA: Diagnosis present

## 2020-09-08 DIAGNOSIS — R937 Abnormal findings on diagnostic imaging of other parts of musculoskeletal system: Secondary | ICD-10-CM

## 2020-09-08 DIAGNOSIS — Z720 Tobacco use: Secondary | ICD-10-CM | POA: Diagnosis not present

## 2020-09-08 LAB — CBC WITH DIFFERENTIAL/PLATELET
Abs Immature Granulocytes: 0.11 10*3/uL — ABNORMAL HIGH (ref 0.00–0.07)
Basophils Absolute: 0 10*3/uL (ref 0.0–0.1)
Basophils Relative: 0 %
Eosinophils Absolute: 0 10*3/uL (ref 0.0–0.5)
Eosinophils Relative: 0 %
HCT: 41.6 % (ref 39.0–52.0)
Hemoglobin: 13.5 g/dL (ref 13.0–17.0)
Immature Granulocytes: 1 %
Lymphocytes Relative: 4 %
Lymphs Abs: 0.3 10*3/uL — ABNORMAL LOW (ref 0.7–4.0)
MCH: 26.9 pg (ref 26.0–34.0)
MCHC: 32.5 g/dL (ref 30.0–36.0)
MCV: 83 fL (ref 80.0–100.0)
Monocytes Absolute: 0.1 10*3/uL (ref 0.1–1.0)
Monocytes Relative: 1 %
Neutro Abs: 7.8 10*3/uL — ABNORMAL HIGH (ref 1.7–7.7)
Neutrophils Relative %: 94 %
Platelets: 176 10*3/uL (ref 150–400)
RBC: 5.01 MIL/uL (ref 4.22–5.81)
RDW: 15.6 % — ABNORMAL HIGH (ref 11.5–15.5)
WBC: 8.3 10*3/uL (ref 4.0–10.5)
nRBC: 0 % (ref 0.0–0.2)

## 2020-09-08 LAB — MAGNESIUM: Magnesium: 1.7 mg/dL (ref 1.7–2.4)

## 2020-09-08 LAB — URINALYSIS, ROUTINE W REFLEX MICROSCOPIC
Bilirubin Urine: NEGATIVE
Glucose, UA: NEGATIVE mg/dL
Ketones, ur: NEGATIVE mg/dL
Nitrite: NEGATIVE
Protein, ur: NEGATIVE mg/dL
RBC / HPF: >50 RBC/hpf — ABNORMAL HIGH (ref 0–5)
Specific Gravity, Urine: 1.013 (ref 1.005–1.030)
pH: 8 (ref 5.0–8.0)

## 2020-09-08 LAB — RESP PANEL BY RT-PCR (FLU A&B, COVID) ARPGX2
Influenza A by PCR: NEGATIVE
Influenza B by PCR: NEGATIVE
SARS Coronavirus 2 by RT PCR: NEGATIVE

## 2020-09-08 LAB — COMPREHENSIVE METABOLIC PANEL
ALT: 28 U/L (ref 0–44)
AST: 50 U/L — ABNORMAL HIGH (ref 15–41)
Albumin: 4 g/dL (ref 3.5–5.0)
Alkaline Phosphatase: 74 U/L (ref 38–126)
Anion gap: 12 (ref 5–15)
BUN: 17 mg/dL (ref 6–20)
CO2: 20 mmol/L — ABNORMAL LOW (ref 22–32)
Calcium: 9 mg/dL (ref 8.9–10.3)
Chloride: 102 mmol/L (ref 98–111)
Creatinine, Ser: 1.23 mg/dL (ref 0.61–1.24)
GFR, Estimated: >60 mL/min (ref >60)
Glucose, Bld: 98 mg/dL (ref 70–99)
Potassium: 3.9 mmol/L (ref 3.5–5.1)
Sodium: 134 mmol/L — ABNORMAL LOW (ref 135–145)
Total Bilirubin: 2.3 mg/dL — ABNORMAL HIGH (ref 0.3–1.2)
Total Protein: 6.7 g/dL (ref 6.5–8.1)

## 2020-09-08 LAB — LACTIC ACID, PLASMA
Lactic Acid, Venous: 3.6 mmol/L (ref 0.5–1.9)
Lactic Acid, Venous: 1.7 mmol/L (ref 0.5–1.9)

## 2020-09-08 LAB — CBG MONITORING, ED: Glucose-Capillary: 121 mg/dL — ABNORMAL HIGH (ref 70–99)

## 2020-09-08 LAB — I-STAT CHEM 8, ED
BUN: 16 mg/dL (ref 6–20)
Calcium, Ion: 1.19 mmol/L (ref 1.15–1.40)
Chloride: 101 mmol/L (ref 98–111)
Creatinine, Ser: 1 mg/dL (ref 0.61–1.24)
Glucose, Bld: 98 mg/dL (ref 70–99)
HCT: 42 % (ref 39.0–52.0)
Hemoglobin: 14.3 g/dL (ref 13.0–17.0)
Potassium: 3 mmol/L — ABNORMAL LOW (ref 3.5–5.1)
Sodium: 138 mmol/L (ref 135–145)
TCO2: 21 mmol/L — ABNORMAL LOW (ref 22–32)

## 2020-09-08 LAB — APTT: aPTT: 20 seconds — ABNORMAL LOW (ref 24–36)

## 2020-09-08 LAB — PROTIME-INR
INR: 1 (ref 0.8–1.2)
Prothrombin Time: 13.5 seconds (ref 11.4–15.2)

## 2020-09-08 LAB — CK: Total CK: 852 U/L — ABNORMAL HIGH (ref 49–397)

## 2020-09-08 MED ORDER — ALBUTEROL SULFATE (2.5 MG/3ML) 0.083% IN NEBU: 2.5000 mg | INHALATION_SOLUTION | RESPIRATORY_TRACT | Status: DC | PRN

## 2020-09-08 MED ORDER — LACTATED RINGERS IV SOLN: INTRAVENOUS | Status: AC

## 2020-09-08 MED ORDER — ACETAMINOPHEN 650 MG RE SUPP
650.0000 mg | Freq: Once | RECTAL | Status: AC
  Administered 2020-09-08: 650 mg via RECTAL
  Filled 2020-09-08: qty 1

## 2020-09-08 MED ORDER — ENOXAPARIN SODIUM 40 MG/0.4ML IJ SOSY: 40.0000 mg | PREFILLED_SYRINGE | INTRAMUSCULAR | Status: DC

## 2020-09-08 MED ORDER — SODIUM CHLORIDE 0.9 % IV SOLN
750.0000 mg | Freq: Once | INTRAVENOUS | Status: AC
  Administered 2020-09-08: 750 mg via INTRAVENOUS
  Filled 2020-09-08: qty 7.5

## 2020-09-08 MED ORDER — LIDOCAINE HCL (PF) 1 % IJ SOLN
INTRAMUSCULAR | Status: AC
  Filled 2020-09-08: qty 5

## 2020-09-08 MED ORDER — DEXTROSE 5 % IV SOLN
900.0000 mg | Freq: Three times a day (TID) | INTRAVENOUS | Status: DC
  Administered 2020-09-08 – 2020-09-13 (×15): 900 mg via INTRAVENOUS
  Filled 2020-09-08 (×18): qty 18

## 2020-09-08 MED ORDER — LORAZEPAM BOLUS VIA INFUSION: 1.0000 mg | INTRAVENOUS | Status: DC | PRN

## 2020-09-08 MED ORDER — KETOROLAC TROMETHAMINE 30 MG/ML IJ SOLN
30.0000 mg | Freq: Once | INTRAMUSCULAR | Status: AC
  Administered 2020-09-09: 30 mg via INTRAVENOUS
  Filled 2020-09-08: qty 1

## 2020-09-08 MED ORDER — VANCOMYCIN HCL 1000 MG/200ML IV SOLN
1000.0000 mg | Freq: Two times a day (BID) | INTRAVENOUS | Status: DC
  Administered 2020-09-09 – 2020-09-10 (×3): 1000 mg via INTRAVENOUS
  Filled 2020-09-08 (×4): qty 200

## 2020-09-08 MED ORDER — ACETAMINOPHEN 325 MG PO TABS
650.0000 mg | ORAL_TABLET | Freq: Four times a day (QID) | ORAL | Status: DC | PRN
  Administered 2020-09-10 – 2020-09-14 (×9): 650 mg via ORAL
  Filled 2020-09-08 (×9): qty 2

## 2020-09-08 MED ORDER — SODIUM CHLORIDE 0.9 % IV SOLN
2.0000 g | Freq: Three times a day (TID) | INTRAVENOUS | Status: DC
  Administered 2020-09-08 – 2020-09-12 (×12): 2 g via INTRAVENOUS
  Filled 2020-09-08 (×12): qty 2

## 2020-09-08 MED ORDER — ONDANSETRON HCL 4 MG/2ML IJ SOLN
4.0000 mg | Freq: Four times a day (QID) | INTRAMUSCULAR | Status: DC | PRN
  Administered 2020-09-12: 4 mg via INTRAVENOUS
  Filled 2020-09-08: qty 2

## 2020-09-08 MED ORDER — LORAZEPAM 2 MG/ML IJ SOLN
INTRAMUSCULAR | Status: AC
  Filled 2020-09-08: qty 1

## 2020-09-08 MED ORDER — LACTATED RINGERS IV BOLUS (SEPSIS)
1000.0000 mL | Freq: Once | INTRAVENOUS | Status: AC
  Administered 2020-09-08: 1000 mL via INTRAVENOUS

## 2020-09-08 MED ORDER — LORAZEPAM 2 MG/ML IJ SOLN
1.0000 mg | INTRAMUSCULAR | Status: AC | PRN
  Administered 2020-09-08: 1 mg via INTRAVENOUS
  Filled 2020-09-08: qty 1

## 2020-09-08 MED ORDER — LORAZEPAM 2 MG/ML IJ SOLN
1.0000 mg | Freq: Once | INTRAMUSCULAR | Status: AC
  Administered 2020-09-08: 1 mg via INTRAVENOUS
  Filled 2020-09-08: qty 1

## 2020-09-08 MED ORDER — METRONIDAZOLE 500 MG/100ML IV SOLN
500.0000 mg | Freq: Once | INTRAVENOUS | Status: AC
  Administered 2020-09-08: 500 mg via INTRAVENOUS
  Filled 2020-09-08: qty 100

## 2020-09-08 MED ORDER — LACTATED RINGERS IV BOLUS
1000.0000 mL | Freq: Once | INTRAVENOUS | Status: AC
  Administered 2020-09-08: 1000 mL via INTRAVENOUS

## 2020-09-08 MED ORDER — VANCOMYCIN HCL 2000 MG/400ML IV SOLN
2000.0000 mg | Freq: Once | INTRAVENOUS | Status: AC
  Administered 2020-09-08: 2000 mg via INTRAVENOUS
  Filled 2020-09-08: qty 400

## 2020-09-08 MED ORDER — LEVETIRACETAM IN NACL 1500 MG/100ML IV SOLN
1500.0000 mg | Freq: Two times a day (BID) | INTRAVENOUS | Status: DC
  Administered 2020-09-08 – 2020-09-11 (×8): 1500 mg via INTRAVENOUS
  Filled 2020-09-08 (×9): qty 100

## 2020-09-08 MED ORDER — LORAZEPAM 2 MG/ML IJ SOLN
1.0000 mg | INTRAMUSCULAR | Status: DC | PRN
  Administered 2020-09-08: 1 mg via INTRAVENOUS
  Filled 2020-09-08: qty 1

## 2020-09-08 MED ORDER — NICOTINE 21 MG/24HR TD PT24
21.0000 mg | MEDICATED_PATCH | Freq: Every day | TRANSDERMAL | Status: DC
  Administered 2020-09-09 – 2020-09-14 (×6): 21 mg via TRANSDERMAL
  Filled 2020-09-08 (×6): qty 1

## 2020-09-08 MED ORDER — ACETAMINOPHEN 650 MG RE SUPP
650.0000 mg | Freq: Four times a day (QID) | RECTAL | Status: DC | PRN
  Administered 2020-09-09: 650 mg via RECTAL
  Filled 2020-09-08: qty 1

## 2020-09-08 MED ORDER — SODIUM CHLORIDE 0.9 % IV SOLN
2.0000 g | Freq: Once | INTRAVENOUS | Status: AC
  Administered 2020-09-08: 2 g via INTRAVENOUS
  Filled 2020-09-08: qty 2

## 2020-09-08 MED ORDER — METOPROLOL TARTRATE 5 MG/5ML IV SOLN
5.0000 mg | Freq: Once | INTRAVENOUS | Status: AC
  Administered 2020-09-09: 5 mg via INTRAVENOUS
  Filled 2020-09-08: qty 5

## 2020-09-08 MED ORDER — ONDANSETRON HCL 4 MG PO TABS: 4.0000 mg | ORAL_TABLET | Freq: Four times a day (QID) | ORAL | Status: DC | PRN

## 2020-09-08 NOTE — Consult Note (Signed)
NEUROLOGY CONSULTATION NOTE   Date of service: September 08, 2020 Patient Name: Keith Dixon MRN:  [id number] DOB:  1960-10-01 Reason for consult: seizures in setting of sepsis _ _ _   _ __   _ __ _ _  __ __   _ __   __ _  History of Present Illness   A 60 year old man with a history of epilepsy, bipolar d/o, PTSD, HTN who presents with 3 breakthrough seizures today in the setting of possible sepsis.  Current outpatient AED regimen is unclear based on my review of chart including care everywhere. At his last ED visit to [facility] Dec 2020 he was taking:  Keppra 1500mg  bid Lamotrigine 200mg  qAM + 300mg  qPM Gabapentin 600mg  bid  He is unable to provide history or clarify AED regimen currently 2/2 post-ictal AMS.   Per ED report pt had temperature last night at home and was lethargic. He had 3 seizures en route to ED with EMS, unclear if they were partial or GTC.   ED course: -Vitals on admission: Temperature 103.7 F, heart rate 125, respiratory rate 42, blood pressure 166/100, maintaining sats but on nonrebreather -Labs on initial presentation: Sodium 134, potassium 3.9, chloride 102, bicarb 20, glucose 98, BUN 17, creatinine 1.23, calcium 9, albumin 4, lactic acid 3.6, WBC 8.3, hemoglobin 13.5, platelets 176, glucose 98, COVID-negative  -Imaging obtained on admission: Chest x-ray unremarkable.  CT of head showed no acute processes. -In the ED the patient was given Tylenol, cefepime, Keppra, Ativan, Flagyl, vancomycin. - Bedside LP attempted by EDP but was unsuccessful    ROS   UTA 2/2 encephalopathy  Past History   Past Medical History:  Diagnosis Date  . Anxiety   . Bipolar 1 disorder, depressed ([place])   . Bipolar disorder ([place])   . Depression   . History of posttraumatic stress disorder (PTSD)   . Hypertension   . PTSD (post-traumatic stress disorder)   . Seizures ([place])   . Smoker   . Tobacco use disorder    History reviewed. No pertinent surgical history. Family  History  Problem Relation Age of Onset  . Seizures Mother   . Breast cancer Mother 52  . Hypertension Brother    Social History   Socioeconomic History  . Marital status: Unknown    Spouse name: Not on file  . Number of children: 1  . Years of education: Not on file  . Highest education level: Not on file  Occupational History    Employer: [employer]  Tobacco Use  . Smoking status: Current Every Day Smoker    Packs/day: 1.00    Years: 20.00    Pack years: 20.00    Types: Cigarettes  . Smokeless tobacco: Never Used  Substance and Sexual Activity  . Alcohol use: No    Comment: once or twice a year  . Drug use: Yes    Types: Marijuana, Cocaine    Comment: pt stated he quit 2 weeks ago 08/10/12, pt reports doing marijuana on 11/13/12  . Sexual activity: Not Currently  Other Topics Concern  . Not on file  Social History Narrative   ** Merged History Encounter **       Social Determinants of Health   Financial Resource Strain: Not on file  Food Insecurity: Not on file  Transportation Needs: Not on file  Physical Activity: Not on file  Stress: Not on file  Social Connections: Not on file   No Known Allergies  Medications  Medications Prior to Admission  Medication Sig Dispense Refill Last Dose  . acetaminophen (TYLENOL) 500 MG tablet Take 2 tablets (1,000 mg total) by mouth every 6 (six) hours as needed for mild pain. 30 tablet 0 unk  . amLODipine (NORVASC) 10 MG tablet Take 10 mg by mouth daily.   unk  . baclofen (LIORESAL) 10 MG tablet Take 10 mg by mouth 2 (two) times daily as needed for muscle spasms.   unk  . Cholecalciferol 2000 UNITS CAPS Take 1 capsule by mouth 2 (two) times daily.   unk  . gabapentin (NEURONTIN) 300 MG capsule Take 600 mg by mouth 2 (two) times daily.   unk  . hydrOXYzine (VISTARIL) 50 MG capsule Take 50 mg by mouth 2 (two) times daily as needed for anxiety (agitation).   unk  . lamoTRIgine (LAMICTAL) 200 MG tablet Take 200-300 mg by mouth  See admin instructions. Take 200mg  in the morning and 300mg  in the evening   unk  . levETIRAcetam (KEPPRA) 750 MG tablet Take 1,500 mg by mouth 2 (two) times daily.   unk  . lisinopril-hydrochlorothiazide (ZESTORETIC) 20-12.5 MG tablet Take 1 tablet by mouth daily.   unk  . meloxicam (MOBIC) 15 MG tablet Take 15 mg by mouth daily. After a meal for joint pain   unk  . QUEtiapine (SEROQUEL) 200 MG tablet Take 200 mg by mouth at bedtime.   unk  . sertraline (ZOLOFT) 100 MG tablet Take 100 mg by mouth daily after breakfast.   unk     Vitals   Vitals:   09/08/20 1709 09/08/20 1715 09/08/20 1730 09/08/20 1857  BP:  140/71 (!) 144/58 (!) 149/79  Pulse:  (!) 109 (!) 106 96  Resp:  (!) 32 (!) 22 (!) 30  Temp: (!) 101.9 F (38.8 C)   98.2 F (36.8 C)  TempSrc: Rectal   Oral  SpO2:  93% 96% 97%  Weight:      Height:         Body mass index is 34.03 kg/m.  Physical Exam   Physical Exam Gen: somnolent, snoring loudly, briefly agitated when awakened then falls asleep again Resp: normal WOB CV: extremities appear well-perfused   Neuro: *MS: somnolent, snoring loudly, briefly agitated when awakened then falls asleep again. Does not follow commands. *Speech: slurred when states first name, no other intelligible speech *CN: PERRLA, blinks to threat bilat, tracks examiner with apparently full EOM, sensation intact, face symmetric at rest, hearing intact to voice *Motor & sensory: Withdraws to noxious stimuli BUE>BLE *Reflexes:  2+ and symmetric throughout without clonus; toes equiv bilat  Labs   CBC:  Recent Labs  Lab 09/08/20 1136 09/08/20 1208  WBC 8.3  --   NEUTROABS 7.8*  --   HGB 13.5 14.3  HCT 41.6 42.0  MCV 83.0  --   PLT 176  --     Basic Metabolic Panel:  Lab Results  Component Value Date   NA 138 09/08/2020   K 3.0 (L) 09/08/2020   CO2 20 (L) 09/08/2020   GLUCOSE 98 09/08/2020   BUN 16 09/08/2020   CREATININE 1.00 09/08/2020   CALCIUM 9.0 09/08/2020    GFRNONAA >60 09/08/2020   GFRAA >60 04/25/2019   Lipid Panel:  Lab Results  Component Value Date   LDLCALC 87 11/07/2010   HgbA1c:  Lab Results  Component Value Date   HGBA1C 6.2 (H) 11/06/2010   Urine Drug Screen:     Component Value Date/Time  LABOPIA NONE DETECTED 11/26/2013 0009   COCAINSCRNUR NONE DETECTED 11/26/2013 0009   LABBENZ NONE DETECTED 11/26/2013 0009   AMPHETMU NONE DETECTED 11/26/2013 0009   THCU POSITIVE (A) 11/26/2013 0009   LABBARB NONE DETECTED 11/26/2013 0009    Alcohol Level     Component Value Date/Time   ETH <10 04/23/2019 1701     Impression   A 60 year old man with a history of epilepsy, bipolar d/o, PTSD, HTN who presents with 3 breakthrough seizures today in the setting of possible sepsis.  Recommendations   - IR LP 4/31 - Empiric coverage for CNS infection while awaiting IR LP. Continue cefepime 2g q 8 hrs, 15 to 20 mg/kg/dose every 8 to 12 hours (being dosed by pharmacy), acyclovir 10mg /kg/dose q 8 hrs (pharm consult placed), and ampicillin 2g IV q 4 hrs. - STAT EEG f/b overnight continuous EEG - S/p LEV 2250mg  load in ED. Continue LEV 1500mg  IV q 12 hrs. Additional AEDs may be added based on EEG - Do not start lamotrigine until we are able to confirm patient is currently taking and at what dose. Abrupt initiation of high-dose lamotrigine is associated with increased risk Stevens-Johnson syndrome. Lamotrigine level ordered  Will continue to follow.  ______________________________________________________________________   Thank you for the opportunity to take part in the care of this patient. If you have any further questions, please contact the neurology consultation attending.  Signed,  Su Monks, MD [facility] [phone]  If 7pm- 7am, please page neurology on call as listed in [place].

## 2020-09-08 NOTE — Progress Notes (Signed)
Pt continuously attempting to remove IVs and vital signs monitoring equipment. Pt also attempt to leave. Non-violent restraints applied for pt safety. Restraints applied to bilateral wrists. Skin intact and restraints applied properly.
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
## 2020-09-08 NOTE — Progress Notes (Signed)
[facility] night shift.  The nursing staff reported that the patient was having jerking movements which improved with lorazepam.  He is a 60 year old male admitted for seizures, seizures, metabolic encephalopathy and hypertension.  Please see H&P and neurology consult note for further details.    A few minutes later, the nursing staff also reported that the patient was having fever with a temperature of 103.58F.  He is currently on metronidazole, vancomycin and cefepime.   The rest of the vital signs are: Heart rate  117, respiratory rate 18, BP 180/81 mmHg with an O2 sat of 100% on room air.  Lungs are clear.  Tachycardic with a regular rhythm in the 110s, no murmurs or rubs, no edema seen on extremities.  Abdomen soft.   Given multiple seizure episodes today, I have treated this fever very aggressively.  In addition to a rectal suppository acetaminophen, a cooling blanket, LR bolus 1000 mL over 2 hours, Toradol 30 mg IVP for further antipyretic effect. His home antihypertensive medications have been held due to concern for safe oral intake.  Metoprolol 5 mg IVP x1 ordered.  Tennis Must, MD.

## 2020-09-08 NOTE — ED Triage Notes (Signed)
Pt from home BIB EMS for sz; hx of same. Pt vomiting since this am, c/o lower back and leg pain, and family reports some confusion. Pt starting seizure with EMS x3. 20mg  IM Versed given in route. Snoring resp on arrival. Rectal temp 103.7. BGL 121.

## 2020-09-08 NOTE — Progress Notes (Signed)
MD aware of [place] yellow, code sepsis already in progress

## 2020-09-08 NOTE — ED Notes (Signed)
Patient transported to CT 

## 2020-09-08 NOTE — Progress Notes (Signed)
Patient arrived to 3w-21

## 2020-09-08 NOTE — ED Provider Notes (Signed)
  Physical Exam  BP 139/83   Pulse (!) 103   Temp (!) 103.7 F (39.8 C) (Rectal)   Resp (!) 28   Ht 6' (1.829 m) Comment: prior encounter  Wt 113.8 kg   SpO2 99%   BMI 34.03 kg/m   Physical Exam  ED Course/Procedures     .Lumbar Puncture  Date/Time: 09/08/2020 6:39 PM Performed by: Tretha Sciara, MD Authorized by: Charlesetta Shanks, MD   Consent:    Consent obtained:  Written   Consent given by:  Guardian   Risks discussed:  Bleeding, infection, nerve damage, repeat procedure, pain and headache   Alternatives discussed:  No treatment, delayed treatment and alternative treatment Universal protocol:    Procedure explained and questions answered to patient or proxy's satisfaction: yes     Relevant documents present and verified: yes     Test results available: yes     Patient identity confirmed:  Verbally with patient and arm band Pre-procedure details:    Procedure purpose:  Diagnostic   Preparation: Patient was prepped and draped in usual sterile fashion   Anesthesia:    Anesthesia method:  Local infiltration   Local anesthetic:  Lidocaine 1% w/o epi Procedure details:    Lumbar space:  L3-L4 interspace   Patient position:  L lateral decubitus   Needle gauge:  22   Needle type:  Spinal needle - Quincke tip   Needle length (in):  2.5   Number of attempts:  2 Post-procedure details:    Puncture site:  Adhesive bandage applied   Procedure completion:  Tolerated Comments:     No fluid retrieved.    MDM  Care patient received from previous provider at 1500.  Patient admitted for fever of unknown origin.  Possible meningitis given the presence of seizures.  Neurology recommended LP which was attempted in the emergency department as above but ultimately unsuccessful.  Patient already undergoing empiric therapy and will get diagnostic fluoroscopy guided LP tomorrow with interventional radiology if possible.  Ordered at this time.        Tretha Sciara,  MD 09/08/20 1840    Charlesetta Shanks, MD 09/11/20 1051

## 2020-09-08 NOTE — ED Notes (Signed)
Pt alarms going off. Upon entering the room, the pt was standing at the doorway with blood and urine on the floor. The pt pulled out one of his IVs, pulled off his condom catheters, urinated on the floor and was having a bowel movement. The daughter was at bedside and was unable to stop the pt from getting up and couldn't reach the call light. Pt and room cleaned. New IV inserted. New gown and sheets applied. IVF and medications restarted.

## 2020-09-08 NOTE — Progress Notes (Signed)
Pt w/ jerking seizure like activity at 2307 lasting 5 mins, prn Ativan admin. HR 130's pt AOx1. Dr. Olevia Bowens informed.

## 2020-09-08 NOTE — Progress Notes (Addendum)
Pharmacy Antibiotic Note  Keith Dixon is a 60 y.o. male admitted on 09/08/2020 with seizures, altered mental status and possible sepsis.  Pharmacy has been consulted for vancomycin and cefepime dosing.  Tmax 103.22F, lactic acid 3.6, WBC 8.3. Kidney function at baseline with SCr 1.0, CrCl ~106.    Plan: Give vancomycin IV load 2,000mg  x1 Initiate vancomycin 1,000mg  IV every 12 hours (Estimated AUC 450)  Goal AUC 400-600  Initiate cefepime 2g IV every 8 hours Follow up with cultures, antibiotic de-escalation and LOT Monitor renal function and clinical progress, obtain peak/trough if indicated   Weight: 113.8 kg (250 lb 14.1 oz)  Temp (24hrs), Avg:103.7 F (39.8 C), Min:103.7 F (39.8 C), Max:103.7 F (39.8 C)  No results for input(s): WBC, CREATININE, LATICACIDVEN, VANCOTROUGH, VANCOPEAK, VANCORANDOM, GENTTROUGH, GENTPEAK, GENTRANDOM, TOBRATROUGH, TOBRAPEAK, TOBRARND, AMIKACINPEAK, AMIKACINTROU, AMIKACIN in the last 168 hours.  CrCl cannot be calculated (Patient's most recent lab result is older than the maximum 21 days allowed.).    No Known Allergies  Antimicrobials this admission: Cefepime 4/30 >>  Vancomycin 4/30 >>  Metronidazole 4/30 x1  Microbiology results: 4/30 BCx: sent  Thank you for allowing pharmacy to be a part of this patient's care.  Mercy Riding, PharmD PGY1 Acute Care Pharmacy Resident Please refer to [facility] for unit-specific pharmacist

## 2020-09-08 NOTE — H&P (Signed)
History and Physical  Patient Name: Keith Dixon     [id number]    DOB: [birth year]    DOA: 09/08/2020 PCP: Verline Lema, MD  Patient coming from: Home  Chief Complaint: Seizures    HPI: Keith Dixon is a 60 y.o. male, with PMH of seizures, PTSD hypertension, bipolar who presented to the ER on 09/08/2020 with seizures, encephalopathy.  At the time my exam, patient is encephalopathic, postictal, and received Ativan, thus unable to obtain complete and thorough HPI review of systems.  Apparently last night patient had a temperature and was slightly lethargic.  However this morning patient was lethargic and apparently had few seizures at home.  He has a history of seizures in the past.  Family notes he is compliant with his seizure medication however documented previous history of noncompliance.  Daughter bedside notes prior seizures when he had a UTI previously.  On EMS witnessed 3 seizures.  He was given IM Versed in route.  Blood sugar normal.    ED course: -Vitals on admission: Temperature 103.7 F, heart rate 125, respiratory rate 42, blood pressure 166/100, maintaining sats but on nonrebreather -Labs on initial presentation: Sodium 134, potassium 3.9, chloride 102, bicarb 20, glucose 98, BUN 17, creatinine 1.23, calcium 9, albumin 4, lactic acid 3.6, WBC 8.3, hemoglobin 13.5, platelets 176, glucose 98, COVID-negative  -Imaging obtained on admission: Chest x-ray unremarkable.  CT of head showed no acute processes. -In the ED the patient was given Tylenol, cefepime, Keppra, Ativan, Flagyl, vancomycin.  Neurology consulted in the ER, recommended lumbar puncture.  The hospitalist service was contacted for further evaluation and management.     ROS: Unable to obtain due to patient status     Past Medical History:  Diagnosis Date  . Anxiety   . Bipolar 1 disorder, depressed ([place])   . Bipolar disorder ([place])   . Depression   . History of posttraumatic stress disorder (PTSD)    . Hypertension   . PTSD (post-traumatic stress disorder)   . Seizures ([place])   . Smoker   . Tobacco use disorder     History reviewed. No pertinent surgical history.  Social History: Patient lives at home.  smoker.  No Known Allergies  Family history: family history includes Breast cancer (age of onset: 10) in his mother; Hypertension in his brother; Seizures in his mother.  Prior to Admission medications   Medication Sig Start Date End Date Taking? Authorizing Provider  acetaminophen (TYLENOL) 500 MG tablet Take 2 tablets (1,000 mg total) by mouth every 6 (six) hours as needed for mild pain. 04/25/19   Meuth, Brooke A, PA-C  amLODipine (NORVASC) 10 MG tablet Take 10 mg by mouth daily.    [provider]  amLODipine (NORVASC) 10 MG tablet Take 10 mg by mouth daily.    [provider]  baclofen (LIORESAL) 10 MG tablet Take 10 mg by mouth 2 (two) times daily as needed for muscle spasms.    [provider]  cephALEXin (KEFLEX) 500 MG capsule Take 1 capsule (500 mg total) by mouth 2 (two) times daily. 10/18/16   Ward, [name], DO  Cholecalciferol 2000 UNITS CAPS Take 1 capsule by mouth 2 (two) times daily.    [provider]  ciprofloxacin (CIPRO) 500 MG tablet Take 1 tablet (500 mg total) by mouth 2 (two) times daily. x10 days 11/28/13   Mendel Corning, MD  citalopram (CELEXA) 40 MG tablet Take 20 mg by mouth daily. Takes one-half  of 40mg  tablet to equal 20mg     [provider]  gabapentin (NEURONTIN) 300 MG capsule Take 600 mg by mouth 2 (two) times daily.    [provider]  hydrOXYzine (VISTARIL) 50 MG capsule Take 50 mg by mouth 2 (two) times daily as needed for anxiety.    [provider]  hydrOXYzine (VISTARIL) 50 MG capsule Take 50 mg by mouth 2 (two) times daily as needed for anxiety (agitation).    [provider]  lamoTRIgine (LAMICTAL) 200 MG tablet Take 1-1.5 tablets (200-300 mg total) by mouth 2 (two)  times daily. Take one (1) tablet in the morning and one and a half tablet in the evening. 11/28/13   [name], [name], MD  lamoTRIgine (LAMICTAL) 200 MG tablet Take 200-300 mg by mouth See admin instructions. Take 200mg  in the morning and 300mg  in the evening    [provider]  levETIRAcetam (KEPPRA XR) 500 MG 24 hr tablet Take 2 tablets (1,000 mg total) by mouth daily. 11/28/13   [name], [name], MD  levETIRAcetam (KEPPRA) 750 MG tablet Take 1,500 mg by mouth 2 (two) times daily.    [provider]  lisinopril-hydrochlorothiazide (PRINZIDE,ZESTORETIC) 20-12.5 MG per tablet Take 1 tablet by mouth daily. 11/17/13   [name], [name] Eng, PA-C  lisinopril-hydrochlorothiazide (ZESTORETIC) 20-12.5 MG tablet Take 1 tablet by mouth daily.    [provider]  loperamide (IMODIUM A-D) 2 MG tablet Take 1 tablet (2 mg total) by mouth 4 (four) times daily as needed for diarrhea or loose stools. 11/28/13   [name], Ripudeep Raliegh Ip, MD  meloxicam (MOBIC) 15 MG tablet Take 15 mg by mouth daily. After a meal for joint pain    [provider]  methocarbamol (ROBAXIN) 500 MG tablet Take 500 mg by mouth 4 (four) times daily.    [provider]  naproxen (NAPROSYN) 500 MG tablet Take 500 mg by mouth 2 (two) times daily with a meal.    [provider]  ondansetron (ZOFRAN ODT) 4 MG disintegrating tablet Take 1 tablet (4 mg total) by mouth every 6 (six) hours as needed for nausea or vomiting. 10/18/16   Ward, [name], DO  oxyCODONE (OXY IR/ROXICODONE) 5 MG immediate release tablet Take 1 tablet (5 mg total) by mouth every 6 (six) hours as needed for severe pain. 04/25/19   Meuth, Blaine Hamper, PA-C  oxyCODONE-acetaminophen (PERCOCET/ROXICET) 5-325 MG tablet Take 1-2 tablets by mouth every 6 (six) hours as needed. 10/18/16   Ward, [name], DO  prazosin (MINIPRESS) 2 MG capsule Take 2 mg by mouth at bedtime.    [provider]  QUEtiapine (SEROQUEL) 200 MG tablet Take 200 mg by mouth  at bedtime.    [provider]  saccharomyces boulardii (FLORASTOR) 250 MG capsule Take 1 capsule (250 mg total) by mouth 2 (two) times daily. While on antibiotics 11/28/13   [name], [name], MD  sertraline (ZOLOFT) 100 MG tablet Take 100 mg by mouth daily after breakfast.    [provider]  tamsulosin (FLOMAX) 0.4 MG CAPS capsule Take 1 capsule (0.4 mg total) by mouth daily. Until stone passes 10/18/16   Ward, [name], DO       Physical Exam: BP 139/83   Pulse (!) 103   Temp (!) 103.7 F (39.8 C) (Rectal)   Resp (!) 28   Ht 6' (1.829 m) Comment: prior encounter  Wt 113.8 kg   SpO2 99%   BMI 34.03 kg/m   General  appearance: Well-developed, adult male, lethargic.   Eyes: Anicteric, conjunctiva pink, lids and lashes normal.     ENT: No nasal deformity, discharge, epistaxis.  Hearing intact.  Neck: No neck masses.  Trachea midline.  No thyromegaly/tenderness. Lymph: No cervical or supraclavicular lymphadenopathy. Skin: Warm and dry.  No jaundice.  No suspicious rashes or lesions. Cardiac:  Tachycardic, nl S1-S2, no murmurs appreciated.  No LE edema.  Radial and pedal pulses 2+ and symmetric. Respiratory: Normal respiratory rate and rhythm.  CTAB without rales or wheezes. Abdomen: Abdomen soft.  No tenderness with palpation. No ascites, distension, hepatosplenomegaly.   MSK: No deformities or effusions of the large joints of the upper or lower extremities bilaterally.  No cyanosis or clubbing. Neuro:  Lethargic, no neck tenderness    Labs on Admission:  I have personally reviewed following labs and imaging studies: CBC: Recent Labs  Lab 09/08/20 1136 09/08/20 1208  WBC 8.3  --   NEUTROABS 7.8*  --   HGB 13.5 14.3  HCT 41.6 42.0  MCV 83.0  --   PLT 176  --    Basic Metabolic Panel: Recent Labs  Lab 09/08/20 1136 09/08/20 1208  NA 134* 138  K 3.9 3.0*  CL 102 101  CO2 20*  --   GLUCOSE 98 98  BUN 17 16  CREATININE 1.23 1.00  CALCIUM 9.0  --     GFR: Estimated Creatinine Clearance: 106.2 mL/min (by C-G formula based on SCr of 1 mg/dL).  Liver Function Tests: Recent Labs  Lab 09/08/20 1136  AST 50*  ALT 28  ALKPHOS 74  BILITOT 2.3*  PROT 6.7  ALBUMIN 4.0   No results for input(s): LIPASE, AMYLASE in the last 168 hours. No results for input(s): AMMONIA in the last 168 hours. Coagulation Profile: Recent Labs  Lab 09/08/20 1136  INR 1.0   Cardiac Enzymes: No results for input(s): CKTOTAL, CKMB, CKMBINDEX, TROPONINI in the last 168 hours. BNP (last 3 results) No results for input(s): PROBNP in the last 8760 hours. HbA1C: No results for input(s): HGBA1C in the last 72 hours. CBG: Recent Labs  Lab 09/08/20 1129  GLUCAP 121*   Lipid Profile: No results for input(s): CHOL, HDL, LDLCALC, TRIG, CHOLHDL, LDLDIRECT in the last 72 hours. Thyroid Function Tests: No results for input(s): TSH, T4TOTAL, FREET4, T3FREE, THYROIDAB in the last 72 hours. Anemia Panel: No results for input(s): VITAMINB12, FOLATE, FERRITIN, TIBC, IRON, RETICCTPCT in the last 72 hours.   Recent Results (from the past 240 hour(s))  Resp Panel by RT-PCR (Flu A&B, Covid) Nasopharyngeal Swab     Status: None   Collection Time: 09/08/20 11:36 AM   Specimen: Nasopharyngeal Swab; Nasopharyngeal(NP) swabs in vial transport medium  Result Value Ref Range Status   SARS Coronavirus 2 by RT PCR NEGATIVE NEGATIVE Final    Comment: (NOTE) SARS-CoV-2 target nucleic acids are NOT DETECTED.  The SARS-CoV-2 RNA is generally detectable in upper respiratory specimens during the acute phase of infection. The lowest concentration of SARS-CoV-2 viral copies this assay can detect is 138 copies/mL. A negative result does not preclude SARS-Cov-2 infection and should not be used as the sole basis for treatment or other patient management decisions. A negative result may occur with  improper specimen collection/handling, submission of specimen other than  nasopharyngeal swab, presence of viral mutation(s) within the areas targeted by this assay, and inadequate number of viral copies(<138 copies/mL). A negative result must be combined with clinical observations, patient history, and epidemiological information. The  expected result is Negative.  Fact Sheet for Patients:  EntrepreneurPulse.com.au  Fact Sheet for Healthcare Providers:  IncredibleEmployment.be  This test is no t yet approved or cleared by the [place] FDA and  has been authorized for detection and/or diagnosis of SARS-CoV-2 by FDA under an Emergency Use Authorization (EUA). This EUA will remain  in effect (meaning this test can be used) for the duration of the COVID-19 declaration under Section 564(b)(1) of the Act, 21 U.S.C.section 360bbb-3(b)(1), unless the authorization is terminated  or revoked sooner.       Influenza A by PCR NEGATIVE NEGATIVE Final   Influenza B by PCR NEGATIVE NEGATIVE Final    Comment: (NOTE) The Xpert Xpress SARS-CoV-2/FLU/RSV plus assay is intended as an aid in the diagnosis of influenza from Nasopharyngeal swab specimens and should not be used as a sole basis for treatment. Nasal washings and aspirates are unacceptable for Xpert Xpress SARS-CoV-2/FLU/RSV testing.  Fact Sheet for Patients: EntrepreneurPulse.com.au  Fact Sheet for Healthcare Providers: IncredibleEmployment.be  This test is not yet approved or cleared by the [place] FDA and has been authorized for detection and/or diagnosis of SARS-CoV-2 by FDA under an Emergency Use Authorization (EUA). This EUA will remain in effect (meaning this test can be used) for the duration of the COVID-19 declaration under Section 564(b)(1) of the Act, 21 U.S.C. section 360bbb-3(b)(1), unless the authorization is terminated or revoked.  Performed at [facility], [place] [address] [place], [place] [address]             Radiological Exams on Admission: Personally reviewed imaging which shows: Chest x-ray unremarkable.  CT of head showed no acute processes. CT Head Wo Contrast  Result Date: 09/08/2020 CLINICAL DATA:  Mental status change.  Seizure. EXAM: CT HEAD WITHOUT CONTRAST TECHNIQUE: Contiguous axial images were obtained from the base of the skull through the vertex without intravenous contrast. COMPARISON:  Head CT dated 04/23/2019 FINDINGS: Brain: Ventricles are stable in size and configuration. No mass, hemorrhage, edema or other evidence of acute parenchymal abnormality. No extra-axial hemorrhage. Vascular: Chronic calcified atherosclerotic changes of the large vessels at the skull base. No unexpected hyperdense vessel. Skull: Normal. Negative for fracture or focal lesion. Sinuses/Orbits: No acute finding. Other: Low-density mass within the subcutaneous soft tissues lateral to the RIGHT orbit, presumed sebaceous cyst. IMPRESSION: No acute findings. No intracranial mass, hemorrhage or edema. Electronically Signed   By: Franki Cabot M.D.   On: 09/08/2020 15:13   DG Chest Port 1 View  Result Date: 09/08/2020 CLINICAL DATA:  Sepsis EXAM: PORTABLE CHEST 1 VIEW COMPARISON:  04/25/2011 FINDINGS: Low lung volumes. No focal consolidation. No pleural effusion or pneumothorax. Heart and mediastinal contours are unremarkable. No acute osseous abnormality. IMPRESSION: No active disease. Electronically Signed   By: Kathreen Devoid   On: 09/08/2020 12:39         Assessment/Plan   1.  Seizures -Multiple seizures prior to admission - Prior history of seizures, was on Lamictal at home - Concern for infectious etiology as he was tachycardic and febrile, but no source identified -CT head on admission was negative for any acute processes - Neurology consulted in the ED, recommended lumbar puncture - Lumbar puncture will be attempted by ER provider - Antiepileptic medications per neurology - Seizure  precautions  2.  SIRS - On admission, tachycardic, febrile - Chest x-ray unremarkable for any acute processes - UA pending - LP pending - Continue maintenance IV fluids - Has received Flagyl, vancomycin,  and cefepime thus far.  We will continue for now  3.  Metabolic encephalopathy - Likely multifactorial including seizures, postictal, benzos - CT head was negative for any acute processes -Urine drug screen added on - See further plans above  4.  Essential hypertension - Plan to restart home BP medications once home meds reconciled and able to tolerate p.o. intake safely  5.  Tobacco abuse - Cessation education ordered - Nicotine patch prescribed while inpatient    DVT prophylaxis: SCDs for now given LP Code Status: Full Family Communication: Daughter bedside Disposition Plan: Anticipate discharge home when medically optimized Consults called: Neurology Admission status: Stepdown given neuro instability    Medical decision making: Patient seen at 3:59 PM on 09/08/2020.  The patient was discussed with ER provider.  What exists of the patient's chart was reviewed in depth and summarized above.  Clinical condition: Fair.        Doran Heater [facility] Please page though [place] or Epic secure chat:  For password, contact charge nurse

## 2020-09-08 NOTE — Sepsis Progress Note (Signed)
elink is following code sepsis 

## 2020-09-08 NOTE — ED Notes (Signed)
Pt alarms going off. Upon entering room, the pt was standing near the door with blood and urine on the floor and feces coming down his legs. The pt pulled out one of his IVs and

## 2020-09-08 NOTE — Progress Notes (Signed)
Pharmacy Antibiotic Note  Keith Dixon is a 60 y.o. male admitted on 09/08/2020 with seizures, altered mental status and possible sepsis.  Pharmacy was  consulted for vancomycin and cefepime dosing. 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   -wt= 113.8kg (adjusted body weight 92.2kg)  Plan: Acyclovir 900 mg (~ 10mg /kg)  IV q8h Monitor renal function and clinical progress   Height: 6' (182.9 cm) (prior encounter) Weight: 113.8 kg (250 lb 14.1 oz) IBW/kg (Calculated) : 77.6  Temp (24hrs), Avg:101.3 F (38.5 C), Min:98.2 F (36.8 C), Max:103.7 F (39.8 C)  Recent Labs  Lab 09/08/20 1136 09/08/20 1208 09/08/20 1612  WBC 8.3  --   --   CREATININE 1.23 1.00  --   LATICACIDVEN 3.6*  --  1.7    Estimated Creatinine Clearance: 106.2 mL/min (by C-G formula based on SCr of 1 mg/dL).    No Known Allergies  Antimicrobials this admission: Acyclovir 4/30<< Cefepime 4/30 >>  Vancomycin 4/30 >>  Metronidazole 4/30 x1  Microbiology results: 4/30 BCx: sent  Thank you for allowing pharmacy to be a part of this patient's care.  Hildred Laser, PharmD Clinical Pharmacist **Pharmacist phone directory can now be found on [email or web address] ([email or web address]).  Listed under [place].

## 2020-09-09 ENCOUNTER — Inpatient Hospital Stay (HOSPITAL_COMMUNITY): Payer: No Typology Code available for payment source

## 2020-09-09 DIAGNOSIS — R509 Fever, unspecified: Secondary | ICD-10-CM | POA: Diagnosis not present

## 2020-09-09 DIAGNOSIS — G40901 Epilepsy, unspecified, not intractable, with status epilepticus: Principal | ICD-10-CM

## 2020-09-09 DIAGNOSIS — I10 Essential (primary) hypertension: Secondary | ICD-10-CM | POA: Diagnosis not present

## 2020-09-09 DIAGNOSIS — G40909 Epilepsy, unspecified, not intractable, without status epilepticus: Secondary | ICD-10-CM | POA: Diagnosis not present

## 2020-09-09 LAB — URINE CULTURE: Culture: NO GROWTH

## 2020-09-09 LAB — BASIC METABOLIC PANEL
Anion gap: 12 (ref 5–15)
BUN: 17 mg/dL (ref 6–20)
CO2: 20 mmol/L — ABNORMAL LOW (ref 22–32)
Calcium: 8.7 mg/dL — ABNORMAL LOW (ref 8.9–10.3)
Chloride: 103 mmol/L (ref 98–111)
Creatinine, Ser: 1.12 mg/dL (ref 0.61–1.24)
GFR, Estimated: >60 mL/min (ref >60)
Glucose, Bld: 84 mg/dL (ref 70–99)
Potassium: 3.2 mmol/L — ABNORMAL LOW (ref 3.5–5.1)
Sodium: 135 mmol/L (ref 135–145)

## 2020-09-09 LAB — HIV ANTIBODY (ROUTINE TESTING W REFLEX): HIV Screen 4th Generation wRfx: NONREACTIVE

## 2020-09-09 LAB — CBC
HCT: 36.9 % — ABNORMAL LOW (ref 39.0–52.0)
Hemoglobin: 12.3 g/dL — ABNORMAL LOW (ref 13.0–17.0)
MCH: 27.1 pg (ref 26.0–34.0)
MCHC: 33.3 g/dL (ref 30.0–36.0)
MCV: 81.3 fL (ref 80.0–100.0)
Platelets: 102 10*3/uL — ABNORMAL LOW (ref 150–400)
RBC: 4.54 MIL/uL (ref 4.22–5.81)
RDW: 15.8 % — ABNORMAL HIGH (ref 11.5–15.5)
WBC: 10.2 10*3/uL (ref 4.0–10.5)
nRBC: 0 % (ref 0.0–0.2)

## 2020-09-09 LAB — MAGNESIUM: Magnesium: 1.5 mg/dL — ABNORMAL LOW (ref 1.7–2.4)

## 2020-09-09 LAB — PHOSPHORUS: Phosphorus: 1.6 mg/dL — ABNORMAL LOW (ref 2.5–4.6)

## 2020-09-09 MED ORDER — QUETIAPINE FUMARATE 100 MG PO TABS
100.0000 mg | ORAL_TABLET | Freq: Every day | ORAL | Status: DC
  Administered 2020-09-09 – 2020-09-13 (×5): 100 mg via ORAL
  Filled 2020-09-09 (×5): qty 1

## 2020-09-09 MED ORDER — LAMOTRIGINE 100 MG PO TABS
200.0000 mg | ORAL_TABLET | Freq: Every day | ORAL | Status: DC
  Administered 2020-09-09: 200 mg via ORAL
  Filled 2020-09-09: qty 2

## 2020-09-09 MED ORDER — GABAPENTIN 300 MG PO CAPS
600.0000 mg | ORAL_CAPSULE | Freq: Two times a day (BID) | ORAL | Status: DC
  Administered 2020-09-09 – 2020-09-14 (×11): 600 mg via ORAL
  Filled 2020-09-09 (×11): qty 2

## 2020-09-09 MED ORDER — HYDROXYZINE HCL 50 MG PO TABS
50.0000 mg | ORAL_TABLET | Freq: Two times a day (BID) | ORAL | Status: DC | PRN
  Filled 2020-09-09: qty 1

## 2020-09-09 MED ORDER — SERTRALINE HCL 100 MG PO TABS
100.0000 mg | ORAL_TABLET | Freq: Every day | ORAL | Status: DC
  Administered 2020-09-10 – 2020-09-14 (×5): 100 mg via ORAL
  Filled 2020-09-09 (×5): qty 1

## 2020-09-09 MED ORDER — POTASSIUM CHLORIDE CRYS ER 20 MEQ PO TBCR
40.0000 meq | EXTENDED_RELEASE_TABLET | Freq: Once | ORAL | Status: AC
  Administered 2020-09-09: 40 meq via ORAL
  Filled 2020-09-09: qty 2

## 2020-09-09 MED ORDER — LAMOTRIGINE 100 MG PO TABS
300.0000 mg | ORAL_TABLET | Freq: Every day | ORAL | Status: DC
  Administered 2020-09-09: 300 mg via ORAL
  Filled 2020-09-09: qty 3

## 2020-09-09 NOTE — Procedures (Addendum)
Patient Name: Keith Dixon  MRN: [id number]  Epilepsy Attending: Lora Havens  Referring Physician/Provider: Dr Su Monks Date: 09/09/2020 Duration: 25.30 mins  Patient history: 60 year old man with a history of epilepsy, bipolar d/o, PTSD, HTN who presents with 3 breakthrough seizures today in the setting of possible sepsis. EEG to evaluate for seizure.  Level of alertness: Awake,asleep  AEDs during EEG study: LEV, LTG, GBP  Technical aspects: This EEG study was done with scalp electrodes positioned according to the 10-20 International system of electrode placement. Electrical activity was acquired at a sampling rate of 500Hz  and reviewed with a high frequency filter of 70Hz  and a low frequency filter of 1Hz . EEG data were recorded continuously and digitally stored.   Description: The posterior dominant rhythm consists of 8 Hz activity of moderate voltage (25-35 uV) seen predominantly in posterior head regions, symmetric and reactive to eye opening and eye closing. Sleep was characterized by vertex waves, sleep spindles (12 to 14 Hz), maximal frontocentral region. Single sharp transient was noted in left frontal region. Hyperventilation and photic stimulation were not performed.     IMPRESSION: This study is within normal limits. No seizures or definite epileptiform discharges were seen throughout the recording.   Meggie Laseter Barbra Sarks

## 2020-09-09 NOTE — Progress Notes (Signed)
Neurology Progress Note  Subjective: Patient alert and interactive. He cannot remember what medications he currently takes for seizures, he gets them filled at [facility]  Interval data: cEEG overnight WNL. IR LP ordered and pending. Continues on broad spectrum abx.  Exam: Vitals:   09/09/20 1145 09/09/20 1526  BP: 116/84 127/85  Pulse: 77 86  Resp: (!) 22 20  Temp: 98.6 F (37 C) 98.4 F (36.9 C)  SpO2: 99% 98%   Gen: In bed, NAD Resp: non-labored breathing, no acute distress Abd: soft, nt  Neuro: MS: A&Ox4 WE:RXVQM, VFF, EOMI, face symmetric, hearing intact to voice Motor: 5/5 throughout Sensory:SILT DTR:2+ and symmetric throughout   Impression:  A 60 year old man with a history of epilepsy, bipolar d/o, PTSD, HTN who presents with 3 breakthrough seizures in the setting of possible sepsis. No further sz after starting empiric abx coverage. Awaiting IR LP   Recommendations:  - IR LP 5/2 - Empiric coverage for CNS infection while awaiting IR LP. Continue cefepime 2g q 8 hrs, 15 to 20 mg/kg/dose every 8 to 12 hours (being dosed by pharmacy), acyclovir 10mg /kg/dose q 8 hrs (pharm consult placed), and ampicillin 2g IV q 4 hrs. - No sz on o/n EEG, d/c LTM - S/p LEV 2250mg  load in ED. Continue LEV 1500mg  IV q 12 hrs.  - [facility] and restart home AEDs. Patient cannot tell me if he is still on lamotrigine and thinks it may have been discontinued. Do not start lamotrigine until we are able to confirm patient is currently taking and at what dose. Abrupt initiation of high-dose lamotrigine is associated with increased risk Stevens-Johnson syndrome. Lamotrigine level ordered  Su Monks, MD [facility] [phone]  If 7pm- 7am, please page neurology on call as listed in [place].

## 2020-09-09 NOTE — Plan of Care (Signed)

## 2020-09-09 NOTE — Progress Notes (Signed)
EEG complete, overnight next - results pending

## 2020-09-09 NOTE — Plan of Care (Signed)
  Problem: Safety: Goal: Non-violent Restraint(s) Outcome: Progressing   Problem: Education: Goal: Expressions of having a comfortable level of knowledge regarding the disease process will increase Outcome: Progressing   Problem: Coping: Goal: Ability to adjust to condition or change in health will improve Outcome: Progressing Goal: Ability to identify appropriate support needs will improve Outcome: Progressing   Problem: Health Behavior/Discharge Planning: Goal: Compliance with prescribed medication regimen will improve Outcome: Progressing   Problem: Medication: Goal: Risk for medication side effects will decrease Outcome: Progressing   Problem: Clinical Measurements: Goal: Complications related to the disease process, condition or treatment will be avoided or minimized Outcome: Progressing Goal: Diagnostic test results will improve Outcome: Progressing   Problem: Safety: Goal: Verbalization of understanding the information provided will improve Outcome: Progressing   Problem: Self-Concept: Goal: Level of anxiety will decrease Outcome: Progressing Goal: Ability to verbalize feelings about condition will improve Outcome: Progressing   

## 2020-09-09 NOTE — Progress Notes (Signed)
PROGRESS NOTE  Keith [place] JQB:[id number] DOB: 06-Sep-1960 DOA: 09/08/2020 PCP: Verline Lema, MD   LOS: 1 day   Brief Narrative / Interim history: 60 year old male with history of seizure disorders, PTSD, hypertension, bipolar who came into the hospital on 4/30 with seizures as well as encephalopathy.  Per reports, patient had a fever, was lethargic and had had a few seizures at home.  He reports compliance with seizure medications however there is some documentation in the past about nonadherence.  He has had a seizure in the past when he had a urinary tract infection.  EMS witnessed 3 seizures and he was given IM Versed on route.  Initial work-up in the ED was negative for an apparent source he was placed on broad-spectrum antibiotics.  An LP was attempted in the ED and failed  Subjective / 24h Interval events: He is afebrile this morning, doing well.  He is alert and oriented x4, appropriate.  He does not have much recollection about what happened in the last day.  Denies any chest pain, denies any cough or chest congestion.  No shortness of breath.  No abdominal pain, no nausea or vomiting.  No skin rashes or boils.  Denies any headaches, photophobia, nausea vomiting or neck pain.  Assessment & Plan: Principal Problem Seizures-multiple seizures at home in the setting of fever.  He has been placed on broad-spectrum antibiotics, fever is now controlled and he is seizure-free overnight.  He was postictal yesterday on admission but now alert and oriented x4, knows he is in the hospital and knows that he is here for seizures.  Neurology consulted, appreciate follow-up  Active Problems SIRS -febrile on admission, tachycardic.  Initial work-up relatively unremarkable, chest x-ray without acute findings, he is COVID-negative, urinalysis with few bacteria and trace leukocytes overall not that impressive.  An LP was attempted and failed, neurology asking for a fluoroscopy guided.  Continue  antibiotics as on admission, monitor closely blood cultures and urine cultures.  Acute metabolic encephalopathy -in the setting of fever, seizures, postictal state.  CT head was negative.  Resolved this morning  Tobacco use-nicotine patch  Essential hypertension-hold Norvasc and lisinopril HCTZ for now  PTSD, bipolar-resume home medications  Scheduled Meds: . nicotine  21 mg Transdermal Daily  . potassium chloride  40 mEq Oral Once   Continuous Infusions: . acyclovir 900 mg (09/09/20 4097)  . ceFEPime (MAXIPIME) IV 2 g (09/09/20 0540)  . levETIRAcetam 1,500 mg (09/08/20 2327)  . vancomycin 1,000 mg (09/09/20 0132)   PRN Meds:.acetaminophen **OR** acetaminophen, albuterol, LORazepam, ondansetron **OR** ondansetron (ZOFRAN) IV  Diet Orders (From admission, onward)    Start     Ordered   09/08/20 2225  Diet Heart Room service appropriate? Yes; Fluid consistency: Thin  Diet effective now       Question Answer Comment  Room service appropriate? Yes   Fluid consistency: Thin      09/08/20 2224          DVT prophylaxis: SCDs Start: 09/08/20 1542     Code Status: Full Code  Family Communication: No family at bedside  Status is: Inpatient  Remains inpatient appropriate because:Inpatient level of care appropriate due to severity of illness   Dispo: The patient is from: Home              Anticipated d/c is to: Home              Patient currently is not medically stable to d/c.  Difficult to place patient No  Level of care: Progressive  Consultants:  Neurology  Procedures:  None  Microbiology  Blood cultures-pending Urine cultures-pending  Antimicrobials: Vancomycin 4/30 >> Cefepime 4/30 >> Acyclovir 4/30 >>   Objective: Vitals:   09/09/20 0207 09/09/20 0314 09/09/20 0535 09/09/20 0750  BP: (!) 144/78 135/66 125/70 (!) 141/72  Pulse: 93 89 79 77  Resp: 18  18 20   Temp: 99.8 F (37.7 C) 98.7 F (37.1 C) 98.6 F (37 C) 98.5 F (36.9 C)  TempSrc:  Axillary Oral Axillary Oral  SpO2:  100% 100% 100%  Weight:      Height:        Intake/Output Summary (Last 24 hours) at 09/09/2020 0913 Last data filed at 09/09/2020 0630 Gross per 24 hour  Intake 4559.51 ml  Output 1575 ml  Net 2984.51 ml   Filed Weights   09/08/20 1133  Weight: 113.8 kg    Examination:  Constitutional: NAD Eyes: no scleral icterus ENMT: Mucous membranes are moist.  Neck: normal, supple Respiratory: clear to auscultation bilaterally, no wheezing, no crackles. Normal respiratory effort.  Cardiovascular: Regular rate and rhythm, no murmurs / rubs / gallops. No LE edema. Abdomen: non distended, no tenderness. Bowel sounds positive.  Musculoskeletal: no clubbing / cyanosis.  Skin: no rashes Neurologic: CN 2-12 grossly intact. Strength 5/5 in all 4.  Psychiatric: Normal judgment and insight. Alert and oriented x 3.   Data Reviewed: I have independently reviewed following labs and imaging studies   CBC: Recent Labs  Lab 09/08/20 1136 09/08/20 1208 09/09/20 0209  WBC 8.3  --  10.2  NEUTROABS 7.8*  --   --   HGB 13.5 14.3 12.3*  HCT 41.6 42.0 36.9*  MCV 83.0  --  81.3  PLT 176  --  867*   Basic Metabolic Panel: Recent Labs  Lab 09/08/20 1136 09/08/20 1208 09/08/20 2120 09/09/20 0209  NA 134* 138  --  135  K 3.9 3.0*  --  3.2*  CL 102 101  --  103  CO2 20*  --   --  20*  GLUCOSE 98 98  --  84  BUN 17 16  --  17  CREATININE 1.23 1.00  --  1.12  CALCIUM 9.0  --   --  8.7*  MG  --   --  1.7 1.5*  PHOS  --   --   --  1.6*   Liver Function Tests: Recent Labs  Lab 09/08/20 1136  AST 50*  ALT 28  ALKPHOS 74  BILITOT 2.3*  PROT 6.7  ALBUMIN 4.0   Coagulation Profile: Recent Labs  Lab 09/08/20 1136  INR 1.0   HbA1C: No results for input(s): HGBA1C in the last 72 hours. CBG: Recent Labs  Lab 09/08/20 1129  GLUCAP 121*    Recent Results (from the past 240 hour(s))  Resp Panel by RT-PCR (Flu A&B, Covid) Nasopharyngeal Swab      Status: None   Collection Time: 09/08/20 11:36 AM   Specimen: Nasopharyngeal Swab; Nasopharyngeal(NP) swabs in vial transport medium  Result Value Ref Range Status   SARS Coronavirus 2 by RT PCR NEGATIVE NEGATIVE Final    Comment: (NOTE) SARS-CoV-2 target nucleic acids are NOT DETECTED.  The SARS-CoV-2 RNA is generally detectable in upper respiratory specimens during the acute phase of infection. The lowest concentration of SARS-CoV-2 viral copies this assay can detect is 138 copies/mL. A negative result does not preclude SARS-Cov-2 infection and should not be used  as the sole basis for treatment or other patient management decisions. A negative result may occur with  improper specimen collection/handling, submission of specimen other than nasopharyngeal swab, presence of viral mutation(s) within the areas targeted by this assay, and inadequate number of viral copies(<138 copies/mL). A negative result must be combined with clinical observations, patient history, and epidemiological information. The expected result is Negative.  Fact Sheet for Patients:  EntrepreneurPulse.com.au  Fact Sheet for Healthcare Providers:  IncredibleEmployment.be  This test is no t yet approved or cleared by the [place] FDA and  has been authorized for detection and/or diagnosis of SARS-CoV-2 by FDA under an Emergency Use Authorization (EUA). This EUA will remain  in effect (meaning this test can be used) for the duration of the COVID-19 declaration under Section 564(b)(1) of the Act, 21 U.S.C.section 360bbb-3(b)(1), unless the authorization is terminated  or revoked sooner.       Influenza A by PCR NEGATIVE NEGATIVE Final   Influenza B by PCR NEGATIVE NEGATIVE Final    Comment: (NOTE) The Xpert Xpress SARS-CoV-2/FLU/RSV plus assay is intended as an aid in the diagnosis of influenza from Nasopharyngeal swab specimens and should not be used as a sole basis  for treatment. Nasal washings and aspirates are unacceptable for Xpert Xpress SARS-CoV-2/FLU/RSV testing.  Fact Sheet for Patients: EntrepreneurPulse.com.au  Fact Sheet for Healthcare Providers: IncredibleEmployment.be  This test is not yet approved or cleared by the [place] FDA and has been authorized for detection and/or diagnosis of SARS-CoV-2 by FDA under an Emergency Use Authorization (EUA). This EUA will remain in effect (meaning this test can be used) for the duration of the COVID-19 declaration under Section 564(b)(1) of the Act, 21 U.S.C. section 360bbb-3(b)(1), unless the authorization is terminated or revoked.  Performed at [facility], [place] [address] [place], [place] [address]      Radiology Studies: CT Head Wo Contrast  Result Date: 09/08/2020 CLINICAL DATA:  Mental status change.  Seizure. EXAM: CT HEAD WITHOUT CONTRAST TECHNIQUE: Contiguous axial images were obtained from the base of the skull through the vertex without intravenous contrast. COMPARISON:  Head CT dated 04/23/2019 FINDINGS: Brain: Ventricles are stable in size and configuration. No mass, hemorrhage, edema or other evidence of acute parenchymal abnormality. No extra-axial hemorrhage. Vascular: Chronic calcified atherosclerotic changes of the large vessels at the skull base. No unexpected hyperdense vessel. Skull: Normal. Negative for fracture or focal lesion. Sinuses/Orbits: No acute finding. Other: Low-density mass within the subcutaneous soft tissues lateral to the RIGHT orbit, presumed sebaceous cyst. IMPRESSION: No acute findings. No intracranial mass, hemorrhage or edema. Electronically Signed   By: Franki Cabot M.D.   On: 09/08/2020 15:13   DG Chest Port 1 View  Result Date: 09/08/2020 CLINICAL DATA:  Sepsis EXAM: PORTABLE CHEST 1 VIEW COMPARISON:  04/25/2011 FINDINGS: Low lung volumes. No focal consolidation. No pleural effusion or pneumothorax. Heart  and mediastinal contours are unremarkable. No acute osseous abnormality. IMPRESSION: No active disease. Electronically Signed   By: Kathreen Devoid   On: 09/08/2020 12:39    Marzetta Board, MD, PhD [facility]  Between 7 am - 7 pm I am available, please contact me via Amion (for emergencies) or Securechat (non urgent messages)  Between 7 pm - 7 am I am not available, please contact night coverage MD/APP via Amion

## 2020-09-10 ENCOUNTER — Inpatient Hospital Stay (HOSPITAL_COMMUNITY): Payer: No Typology Code available for payment source

## 2020-09-10 DIAGNOSIS — G40901 Epilepsy, unspecified, not intractable, with status epilepticus: Secondary | ICD-10-CM | POA: Diagnosis not present

## 2020-09-10 DIAGNOSIS — I10 Essential (primary) hypertension: Secondary | ICD-10-CM | POA: Diagnosis not present

## 2020-09-10 LAB — CSF CELL COUNT WITH DIFFERENTIAL
RBC Count, CSF: 455 /mm3 — ABNORMAL HIGH
Tube #: 1
WBC, CSF: 8 /mm3 — ABNORMAL HIGH (ref 0–5)

## 2020-09-10 LAB — CBC
HCT: 35.2 % — ABNORMAL LOW (ref 39.0–52.0)
Hemoglobin: 11.7 g/dL — ABNORMAL LOW (ref 13.0–17.0)
MCH: 27.5 pg (ref 26.0–34.0)
MCHC: 33.2 g/dL (ref 30.0–36.0)
MCV: 82.8 fL (ref 80.0–100.0)
Platelets: 107 10*3/uL — ABNORMAL LOW (ref 150–400)
RBC: 4.25 MIL/uL (ref 4.22–5.81)
RDW: 16.1 % — ABNORMAL HIGH (ref 11.5–15.5)
WBC: 9.9 10*3/uL (ref 4.0–10.5)
nRBC: 0 % (ref 0.0–0.2)

## 2020-09-10 LAB — PROTEIN, CSF: Total  Protein, CSF: >600 mg/dL — ABNORMAL HIGH (ref 15–45)

## 2020-09-10 LAB — COMPREHENSIVE METABOLIC PANEL
ALT: 21 U/L (ref 0–44)
AST: 25 U/L (ref 15–41)
Albumin: 2.9 g/dL — ABNORMAL LOW (ref 3.5–5.0)
Alkaline Phosphatase: 57 U/L (ref 38–126)
Anion gap: 8 (ref 5–15)
BUN: 17 mg/dL (ref 6–20)
CO2: 23 mmol/L (ref 22–32)
Calcium: 8.8 mg/dL — ABNORMAL LOW (ref 8.9–10.3)
Chloride: 108 mmol/L (ref 98–111)
Creatinine, Ser: 1.27 mg/dL — ABNORMAL HIGH (ref 0.61–1.24)
GFR, Estimated: >60 mL/min (ref >60)
Glucose, Bld: 108 mg/dL — ABNORMAL HIGH (ref 70–99)
Potassium: 3.3 mmol/L — ABNORMAL LOW (ref 3.5–5.1)
Sodium: 139 mmol/L (ref 135–145)
Total Bilirubin: 0.8 mg/dL (ref 0.3–1.2)
Total Protein: 5.6 g/dL — ABNORMAL LOW (ref 6.5–8.1)

## 2020-09-10 LAB — CRYPTOCOCCAL ANTIGEN, CSF: Crypto Ag: NEGATIVE

## 2020-09-10 LAB — GLUCOSE, CSF: Glucose, CSF: 53 mg/dL (ref 40–70)

## 2020-09-10 LAB — LAMOTRIGINE LEVEL: Lamotrigine Lvl: 2.4 ug/mL (ref 2.0–20.0)

## 2020-09-10 MED ORDER — POTASSIUM CHLORIDE CRYS ER 20 MEQ PO TBCR
30.0000 meq | EXTENDED_RELEASE_TABLET | Freq: Once | ORAL | Status: AC
  Administered 2020-09-10: 30 meq via ORAL
  Filled 2020-09-10: qty 1

## 2020-09-10 MED ORDER — POTASSIUM PHOSPHATES 15 MMOLE/5ML IV SOLN
20.0000 mmol | Freq: Once | INTRAVENOUS | Status: AC
  Administered 2020-09-10: 20 mmol via INTRAVENOUS
  Filled 2020-09-10: qty 6.67

## 2020-09-10 MED ORDER — LIDOCAINE HCL (PF) 1 % IJ SOLN
5.0000 mL | Freq: Once | INTRAMUSCULAR | Status: AC
  Administered 2020-09-10: 5 mL via INTRADERMAL

## 2020-09-10 MED ORDER — VANCOMYCIN HCL 750 MG/150ML IV SOLN
750.0000 mg | Freq: Two times a day (BID) | INTRAVENOUS | Status: DC
  Administered 2020-09-10 – 2020-09-11 (×2): 750 mg via INTRAVENOUS
  Filled 2020-09-10 (×3): qty 150

## 2020-09-10 MED ORDER — MAGNESIUM SULFATE 2 GM/50ML IV SOLN
2.0000 g | Freq: Once | INTRAVENOUS | Status: AC
  Administered 2020-09-10: 2 g via INTRAVENOUS
  Filled 2020-09-10: qty 50

## 2020-09-10 MED ORDER — AMLODIPINE BESYLATE 10 MG PO TABS
10.0000 mg | ORAL_TABLET | Freq: Every day | ORAL | Status: DC
  Administered 2020-09-10 – 2020-09-14 (×5): 10 mg via ORAL
  Filled 2020-09-10 (×5): qty 1

## 2020-09-10 NOTE — Plan of Care (Signed)
  Problem: Safety: Goal: Non-violent Restraint(s) Outcome: Progressing   Problem: Education: Goal: Expressions of having a comfortable level of knowledge regarding the disease process will increase Outcome: Progressing   Problem: Coping: Goal: Ability to adjust to condition or change in health will improve Outcome: Progressing Goal: Ability to identify appropriate support needs will improve Outcome: Progressing   Problem: Health Behavior/Discharge Planning: Goal: Compliance with prescribed medication regimen will improve Outcome: Progressing   Problem: Medication: Goal: Risk for medication side effects will decrease Outcome: Progressing   Problem: Clinical Measurements: Goal: Complications related to the disease process, condition or treatment will be avoided or minimized Outcome: Progressing Goal: Diagnostic test results will improve Outcome: Progressing   Problem: Safety: Goal: Verbalization of understanding the information provided will improve Outcome: Progressing   Problem: Self-Concept: Goal: Level of anxiety will decrease Outcome: Progressing Goal: Ability to verbalize feelings about condition will improve Outcome: Progressing

## 2020-09-10 NOTE — Progress Notes (Signed)
PROGRESS NOTE  Keith Dixon CBJ:[id number] DOB: Sep 30, 1960 DOA: 09/08/2020 PCP: Verline Lema, MD   LOS: 2 days   Brief Narrative / Interim history: 60 year old male with history of seizure disorders, PTSD, hypertension, bipolar who came into the hospital on 4/30 with seizures as well as encephalopathy.  Per reports, patient had a fever, was lethargic and had had a few seizures at home.  He reports compliance with seizure medications however there is some documentation in the past about nonadherence.  He has had a seizure in the past when he had a urinary tract infection.  EMS witnessed 3 seizures and he was given IM Versed on route.  Initial work-up in the ED was negative for an apparent source he was placed on broad-spectrum antibiotics.  An LP was attempted in the ED and failed  Subjective / 24h Interval events: Remains afebrile, alert, appropriate.  No complaints this morning  Assessment & Plan: Principal Problem Seizures-multiple seizures at home in the setting of fever.  He has been placed on broad-spectrum antibiotics, fever is now controlled and he is seizure-free overnight.  -Mental status appears close to baseline.  Neurology following, appreciate input  Active Problems SIRS -febrile on admission, tachycardic.  Initial work-up relatively unremarkable, chest x-ray without acute findings, he is COVID-negative, urinalysis with few bacteria and trace leukocytes overall not that impressive.  An LP was attempted and failed, neurology asking for a fluoroscopy guided.  Continue antibiotics as on admission, monitor closely blood cultures and urine cultures. -LP today  Acute metabolic encephalopathy -in the setting of fever, seizures, postictal state.  CT head was negative.  Resolved  Tobacco use-nicotine patch  Essential hypertension-hold Norvasc and lisinopril HCTZ for now  PTSD, bipolar-resume home medications  Scheduled Meds: . gabapentin  600 mg Oral BID  . nicotine  21 mg  Transdermal Daily  . QUEtiapine  100 mg Oral QHS  . sertraline  100 mg Oral QPC breakfast   Continuous Infusions: . acyclovir 900 mg (09/10/20 0602)  . ceFEPime (MAXIPIME) IV 2 g (09/10/20 0459)  . levETIRAcetam 1,500 mg (09/09/20 2354)  . vancomycin 1,000 mg (09/10/20 0455)   PRN Meds:.acetaminophen **OR** acetaminophen, albuterol, hydrOXYzine, LORazepam, ondansetron **OR** ondansetron (ZOFRAN) IV  Diet Orders (From admission, onward)    Start     Ordered   09/08/20 2225  Diet Heart Room service appropriate? Yes; Fluid consistency: Thin  Diet effective now       Question Answer Comment  Room service appropriate? Yes   Fluid consistency: Thin      09/08/20 2224          DVT prophylaxis: SCDs Start: 09/08/20 1542     Code Status: Full Code  Family Communication: No family at bedside  Status is: Inpatient  Remains inpatient appropriate because:Inpatient level of care appropriate due to severity of illness   Dispo: The patient is from: Home              Anticipated d/c is to: Home              Patient currently is not medically stable to d/c.   Difficult to place patient No  Level of care: Progressive  Consultants:  Neurology  Procedures:  None  Microbiology  Blood cultures-pending Urine cultures-pending  Antimicrobials: Vancomycin 4/30 >> Cefepime 4/30 >> Acyclovir 4/30 >>   Objective: Vitals:   09/09/20 1947 09/10/20 0025 09/10/20 0400 09/10/20 0713  BP: (!) 146/99 129/76 118/80 115/87  Pulse: 88 79 74 80  Resp: (!) 22 17 14 20   Temp: 98.7 F (37.1 C) 97.9 F (36.6 C) 97.7 F (36.5 C) 97.6 F (36.4 C)  TempSrc: Oral Oral Oral Oral  SpO2: 100% 99% 100% 100%  Weight:      Height:        Intake/Output Summary (Last 24 hours) at 09/10/2020 [id number] Last data filed at 09/10/2020 [id number] Gross per 24 hour  Intake --  Output 1100 ml  Net -1100 ml   Filed Weights   09/08/20 1133  Weight: 113.8 kg    Examination:  Constitutional: NAD Eyes: No  icterus ENMT: mmm Neck: normal, supple Respiratory: Clear bilaterally without wheezing or crackles Cardiovascular: Regular rate and rhythm, no murmurs, no edema Abdomen: Soft, NT, ND, bowel sounds positive Musculoskeletal: no clubbing / cyanosis.  Skin: No rashes Neurologic: Nonfocal  Data Reviewed: I have independently reviewed following labs and imaging studies   CBC: Recent Labs  Lab 09/08/20 1136 09/08/20 1208 09/09/20 0209 09/10/20 0330  WBC 8.3  --  10.2 9.9  NEUTROABS 7.8*  --   --   --   HGB 13.5 14.3 12.3* 11.7*  HCT 41.6 42.0 36.9* 35.2*  MCV 83.0  --  81.3 82.8  PLT 176  --  102* [id number]*   Basic Metabolic Panel: Recent Labs  Lab 09/08/20 1136 09/08/20 1208 09/08/20 2120 09/09/20 0209 09/10/20 0330  NA 134* 138  --  135 139  K 3.9 3.0*  --  3.2* 3.3*  CL 102 101  --  103 108  CO2 20*  --   --  20* 23  GLUCOSE 98 98  --  84 108*  BUN 17 16  --  17 17  CREATININE 1.23 1.00  --  1.12 1.27*  CALCIUM 9.0  --   --  8.7* 8.8*  MG  --   --  1.7 1.5*  --   PHOS  --   --   --  1.6*  --    Liver Function Tests: Recent Labs  Lab 09/08/20 1136 09/10/20 0330  AST 50* 25  ALT 28 21  ALKPHOS 74 57  BILITOT 2.3* 0.8  PROT 6.7 5.6*  ALBUMIN 4.0 2.9*   Coagulation Profile: Recent Labs  Lab 09/08/20 1136  INR 1.0   HbA1C: No results for input(s): HGBA1C in the last 72 hours. CBG: Recent Labs  Lab 09/08/20 1129  GLUCAP 121*    Recent Results (from the past 240 hour(s))  Resp Panel by RT-PCR (Flu A&B, Covid) Nasopharyngeal Swab     Status: None   Collection Time: 09/08/20 11:36 AM   Specimen: Nasopharyngeal Swab; Nasopharyngeal(NP) swabs in vial transport medium  Result Value Ref Range Status   SARS Coronavirus 2 by RT PCR NEGATIVE NEGATIVE Final    Comment: (NOTE) SARS-CoV-2 target nucleic acids are NOT DETECTED.  The SARS-CoV-2 RNA is generally detectable in upper respiratory specimens during the acute phase of infection. The lowest concentration  of SARS-CoV-2 viral copies this assay can detect is 138 copies/mL. A negative result does not preclude SARS-Cov-2 infection and should not be used as the sole basis for treatment or other patient management decisions. A negative result may occur with  improper specimen collection/handling, submission of specimen other than nasopharyngeal swab, presence of viral mutation(s) within the areas targeted by this assay, and inadequate number of viral copies(<138 copies/mL). A negative result must be combined with clinical observations, patient history, and epidemiological information. The expected result is Negative.  Fact Sheet for  Patients:  EntrepreneurPulse.com.au  Fact Sheet for Healthcare Providers:  IncredibleEmployment.be  This test is no t yet approved or cleared by the [place] FDA and  has been authorized for detection and/or diagnosis of SARS-CoV-2 by FDA under an Emergency Use Authorization (EUA). This EUA will remain  in effect (meaning this test can be used) for the duration of the COVID-19 declaration under Section 564(b)(1) of the Act, 21 U.S.C.section 360bbb-3(b)(1), unless the authorization is terminated  or revoked sooner.       Influenza A by PCR NEGATIVE NEGATIVE Final   Influenza B by PCR NEGATIVE NEGATIVE Final    Comment: (NOTE) The Xpert Xpress SARS-CoV-2/FLU/RSV plus assay is intended as an aid in the diagnosis of influenza from Nasopharyngeal swab specimens and should not be used as a sole basis for treatment. Nasal washings and aspirates are unacceptable for Xpert Xpress SARS-CoV-2/FLU/RSV testing.  Fact Sheet for Patients: EntrepreneurPulse.com.au  Fact Sheet for Healthcare Providers: IncredibleEmployment.be  This test is not yet approved or cleared by the [place] FDA and has been authorized for detection and/or diagnosis of SARS-CoV-2 by FDA under an Emergency Use  Authorization (EUA). This EUA will remain in effect (meaning this test can be used) for the duration of the COVID-19 declaration under Section 564(b)(1) of the Act, 21 U.S.C. section 360bbb-3(b)(1), unless the authorization is terminated or revoked.  Performed at [facility], [place] [address], [place], [place] [address]   Blood Culture (routine x 2)     Status: None (Preliminary result)   Collection Time: 09/08/20 11:36 AM   Specimen: BLOOD RIGHT HAND  Result Value Ref Range Status   Specimen Description BLOOD RIGHT HAND  Final   Special Requests   Final    BOTTLES DRAWN AEROBIC AND ANAEROBIC Blood Culture adequate volume   Culture   Final    NO GROWTH 1 DAY Performed at [facility], [place] [address], [place], [place] [address]    Report Status PENDING  Incomplete  Blood Culture (routine x 2)     Status: None (Preliminary result)   Collection Time: 09/08/20 11:41 AM   Specimen: BLOOD RIGHT FOREARM  Result Value Ref Range Status   Specimen Description BLOOD RIGHT FOREARM  Final   Special Requests   Final    BOTTLES DRAWN AEROBIC AND ANAEROBIC Blood Culture adequate volume   Culture   Final    NO GROWTH 1 DAY Performed at [facility], [place] [address], [place], [place] [address]    Report Status PENDING  Incomplete  Urine culture     Status: None   Collection Time: 09/08/20  3:42 PM   Specimen: In/Out Cath Urine  Result Value Ref Range Status   Specimen Description IN/OUT CATH URINE  Final   Special Requests NONE  Final   Culture   Final    NO GROWTH Performed at [facility], [place] [address] [place], [place] [address]    Report Status 09/11/2020 FINAL  Final     Radiology Studies: EEG adult  Result Date: 09-11-20 Lora Havens, MD     11-Sep-2020 10:51 AM Patient Name: Keith [place] MRN: [id number] Epilepsy Attending: Lora Havens Referring Physician/Provider: Dr Su Monks Date: 2020/09/11 Duration: 25.30 mins Patient history: 60 year old  man with a history of epilepsy, bipolar d/o, PTSD, HTN who presents with 3 breakthrough seizures today in the setting of possible sepsis. EEG to evaluate for seizure. Level of alertness: Awake,asleep AEDs during  EEG study: LEV, LTG, GBP Technical aspects: This EEG study was done with scalp electrodes positioned according to the 10-20 International system of electrode placement. Electrical activity was acquired at a sampling rate of 500Hz  and reviewed with a high frequency filter of 70Hz  and a low frequency filter of 1Hz . EEG data were recorded continuously and digitally stored. Description: The posterior dominant rhythm consists of 8 Hz activity of moderate voltage (25-35 uV) seen predominantly in posterior head regions, symmetric and reactive to eye opening and eye closing. Sleep was characterized by vertex waves, sleep spindles (12 to 14 Hz), maximal frontocentral region. Single sharp transient was noted in left frontal region. Hyperventilation and photic stimulation were not performed.   IMPRESSION: This study is within normal limits. No seizures or definite epileptiform discharges were seen throughout the recording. Priyanka Mont Dutton, MD, PhD [facility]  Between 7 am - 7 pm I am available, please contact me via Amion (for emergencies) or Securechat (non urgent messages)  Between 7 pm - 7 am I am not available, please contact night coverage MD/APP via Amion

## 2020-09-10 NOTE — Progress Notes (Signed)
Pharmacy Antibiotic Note  Keith [place] is a 60 y.o. male admitted on 09/08/2020 with seizures, altered mental status and possible sepsis.  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  -SCr has trended up to 1.27 (baseline ~1), UOP not accurately documented -afebrile, WBC are normal, cultures neg so far -LP today  Plan: Decrease vancomycin to 750 mg IV q12h (esimated AUC 419.4, SCr 1.27) Goal AUC 400-600 Continue cefepime 2 g IV q8h Continue acyclovir 900 mg (~ 10mg /kg ABW) IV q8h Monitor renal function and clinical progress   Height: 6' (182.9 cm) (prior encounter) Weight: 113.8 kg (250 lb 14.1 oz) IBW/kg (Calculated) : 77.6  Temp (24hrs), Avg:98.2 F (36.8 C), Min:97.6 F (36.4 C), Max:98.7 F (37.1 C)  Recent Labs  Lab 09/08/20 1136 09/08/20 1208 09/08/20 1612 09/09/20 0209 09/10/20 0330  WBC 8.3  --   --  10.2 9.9  CREATININE 1.23 1.00  --  1.12 1.27*  LATICACIDVEN 3.6*  --  1.7  --   --     Estimated Creatinine Clearance: 83.6 mL/min (A) (by C-G formula based on SCr of 1.27 mg/dL (H)).    No Known Allergies  Antimicrobials this admission: Acyclovir 4/30 >> Cefepime 4/30 >>  Vancomycin 4/30 >>  Metronidazole 4/30 x1  Microbiology results: 4/30BCx: ngtd 4/30 UCx (in/out cath): neg  Thank you for involving pharmacy in this patient's care.  Renold Genta, PharmD, BCPS Clinical Pharmacist Clinical phone for 09/10/2020 until 3p is [phone] 09/10/2020 11:06 AM  **Pharmacist phone directory can be found on [email or web address] listed under [place]**

## 2020-09-10 NOTE — Procedures (Addendum)
Patient Name: Keith Dixon  MRN: [id number]  Epilepsy Attending: Lora Havens  Referring Physician/Provider: Dr Su Monks Duration: 09/09/2020 5621 to 09/10/2020 1129  Patient history: 60 year old man with a history of epilepsy, bipolar d/o, PTSD, HTN who presents with 3 breakthrough seizures today in the setting of possible sepsis. EEG to evaluate for seizure.  Level of alertness: Awake,asleep  AEDs during EEG study: LEV, GBP  Technical aspects: This EEG study was done with scalp electrodes positioned according to the 10-20 International system of electrode placement. Electrical activity was acquired at a sampling rate of 500Hz  and reviewed with a high frequency filter of 70Hz  and a low frequency filter of 1Hz . EEG data were recorded continuously and digitally stored.   Description: The posterior dominant rhythm consists of 8 Hz activity of moderate voltage (25-35 uV) seen predominantly in posterior head regions, symmetric and reactive to eye opening and eye closing. Sleep was characterized by vertex waves, sleep spindles (12 to 14 Hz), maximal frontocentral region.  Small sharp spikes were noted in her right fronto-temporal region. Hyperventilation and photic stimulation were not performed.     IMPRESSION: This study is within normal limits. No seizures or definite epileptiform discharges were seen throughout the recording.   Kache Mcclurg Barbra Sarks

## 2020-09-10 NOTE — Progress Notes (Signed)
vLTM EEG complete. No skin breakdown 

## 2020-09-11 ENCOUNTER — Inpatient Hospital Stay (HOSPITAL_COMMUNITY): Payer: No Typology Code available for payment source

## 2020-09-11 DIAGNOSIS — R569 Unspecified convulsions: Secondary | ICD-10-CM | POA: Diagnosis not present

## 2020-09-11 DIAGNOSIS — I10 Essential (primary) hypertension: Secondary | ICD-10-CM | POA: Diagnosis not present

## 2020-09-11 LAB — BASIC METABOLIC PANEL
Anion gap: 10 (ref 5–15)
BUN: 12 mg/dL (ref 6–20)
CO2: 19 mmol/L — ABNORMAL LOW (ref 22–32)
Calcium: 8.8 mg/dL — ABNORMAL LOW (ref 8.9–10.3)
Chloride: 109 mmol/L (ref 98–111)
Creatinine, Ser: 1 mg/dL (ref 0.61–1.24)
GFR, Estimated: >60 mL/min (ref >60)
Glucose, Bld: 121 mg/dL — ABNORMAL HIGH (ref 70–99)
Potassium: 4.2 mmol/L (ref 3.5–5.1)
Sodium: 138 mmol/L (ref 135–145)

## 2020-09-11 MED ORDER — IOHEXOL 350 MG/ML SOLN
75.0000 mL | Freq: Once | INTRAVENOUS | Status: AC | PRN
  Administered 2020-09-11: 75 mL via INTRAVENOUS

## 2020-09-11 MED ORDER — BACLOFEN 10 MG PO TABS
10.0000 mg | ORAL_TABLET | Freq: Two times a day (BID) | ORAL | Status: DC | PRN
  Administered 2020-09-11 – 2020-09-14 (×4): 10 mg via ORAL
  Filled 2020-09-11 (×4): qty 1

## 2020-09-11 MED ORDER — VANCOMYCIN HCL 1000 MG/200ML IV SOLN
1000.0000 mg | Freq: Two times a day (BID) | INTRAVENOUS | Status: DC
  Administered 2020-09-11 – 2020-09-12 (×2): 1000 mg via INTRAVENOUS
  Filled 2020-09-11 (×3): qty 200

## 2020-09-11 MED ORDER — GADOBUTROL 1 MMOL/ML IV SOLN
10.0000 mL | Freq: Once | INTRAVENOUS | Status: AC | PRN
  Administered 2020-09-11: 10 mL via INTRAVENOUS

## 2020-09-11 NOTE — Plan of Care (Signed)
  Problem: Safety: Goal: Non-violent Restraint(s) Outcome: Progressing   Problem: Education: Goal: Expressions of having a comfortable level of knowledge regarding the disease process will increase Outcome: Progressing   Problem: Coping: Goal: Ability to adjust to condition or change in health will improve Outcome: Progressing Goal: Ability to identify appropriate support needs will improve Outcome: Progressing   Problem: Health Behavior/Discharge Planning: Goal: Compliance with prescribed medication regimen will improve Outcome: Progressing   Problem: Medication: Goal: Risk for medication side effects will decrease Outcome: Progressing   Problem: Clinical Measurements: Goal: Complications related to the disease process, condition or treatment will be avoided or minimized Outcome: Progressing Goal: Diagnostic test results will improve Outcome: Progressing   Problem: Safety: Goal: Verbalization of understanding the information provided will improve Outcome: Progressing   Problem: Self-Concept: Goal: Level of anxiety will decrease Outcome: Progressing Goal: Ability to verbalize feelings about condition will improve Outcome: Progressing   

## 2020-09-11 NOTE — Progress Notes (Addendum)
PROGRESS NOTE  Keith Dixon [id number] DOB: 1960/09/13 DOA: 09/08/2020 PCP: Verline Lema, MD   LOS: 3 days   Brief Narrative / Interim history: 60 year old male with history of seizure disorders, PTSD, hypertension, bipolar who came into the hospital on 4/30 with seizures as well as encephalopathy.  Per reports, patient had a fever, was lethargic and had had a few seizures at home.  He reports compliance with seizure medications however there is some documentation in the past about nonadherence.  He has had a seizure in the past when he had a urinary tract infection.  EMS witnessed 3 seizures and he was given IM Versed on route.  Initial work-up in the ED was negative for an apparent source he was placed on broad-spectrum antibiotics.  An LP was attempted in the ED and failed.  He was placed on broad-spectrum antibiotics and admitted to the hospital.  Subjective / 24h Interval events: Remains afebrile, alert, appropriate.  No complaints.  Eating breakfast  Assessment & Plan: Principal Problem Seizures-multiple seizures at home in the setting of fever.  He has been placed on broad-spectrum antibiotics, fever is now controlled and he has remained seizure-free since admission -Mental status appears close to baseline.  Neurology following, appreciate input, defer regimen to neurology team  Active Problems SIRS -febrile on admission, tachycardic.  Initial work-up relatively unremarkable, chest x-ray without acute findings, he is COVID-negative, urinalysis with few bacteria and trace leukocytes overall not that impressive.  Cultures are negative to date.  Underwent an LP on 5/2 with a WBC, 455 RBC normal glucose, negative gram stain.  HSV and varicella-zoster PCR pending.  -if neurology agrees later today perhaps bacterial coverage can be discontinued as LP is not striking for bacterial meningitis, and keep antivirals along until the PCR is back  Acute metabolic encephalopathy -in the  setting of fever, seizures, postictal state.  CT head was negative.  Resolved, he is AxOx4 and appears back to baseline  Tobacco use-nicotine patch  Essential hypertension-continue Norvasc  PTSD, bipolar-resume home medications  Scheduled Meds: . amLODipine  10 mg Oral Daily  . gabapentin  600 mg Oral BID  . nicotine  21 mg Transdermal Daily  . QUEtiapine  100 mg Oral QHS  . sertraline  100 mg Oral QPC breakfast   Continuous Infusions: . acyclovir 900 mg (09/11/20 0546)  . ceFEPime (MAXIPIME) IV 2 g (09/11/20 0508)  . levETIRAcetam 1,500 mg (09/11/20 1114)  . vancomycin     PRN Meds:.acetaminophen **OR** acetaminophen, albuterol, baclofen, hydrOXYzine, LORazepam, ondansetron **OR** ondansetron (ZOFRAN) IV  Diet Orders (From admission, onward)    Start     Ordered   09/08/20 2225  Diet Heart Room service appropriate? Yes; Fluid consistency: Thin  Diet effective now       Question Answer Comment  Room service appropriate? Yes   Fluid consistency: Thin      09/08/20 2224          DVT prophylaxis: SCDs Start: 09/08/20 1542     Code Status: Full Code  Family Communication: No family at bedside  Status is: Inpatient  Remains inpatient appropriate because:Inpatient level of care appropriate due to severity of illness   Dispo: The patient is from: Home              Anticipated d/c is to: Home              Patient currently is not medically stable to d/c.   Difficult to place patient No  Level of care: Progressive  Consultants:  Neurology  Procedures:  None  Microbiology  Blood cultures-pending Urine cultures-pending  Antimicrobials: Vancomycin 4/30 >> Cefepime 4/30 >> Acyclovir 4/30 >>   Objective: Vitals:   09/10/20 2051 09/10/20 2337 09/11/20 0424 09/11/20 0747  BP: (!) 153/92 (!) 146/86 (!) 141/84 (!) 155/79  Pulse: 82 73 71 71  Resp: 20 20 20  (!) 24  Temp: 98 F (36.7 C) 98.4 F (36.9 C) 97.9 F (36.6 C) 98.2 F (36.8 C)  TempSrc: Axillary  Oral Oral Oral  SpO2: 100% 99% 100% 100%  Weight:      Height:        Intake/Output Summary (Last 24 hours) at 09/11/2020 1144 Last data filed at 09/11/2020 1117 Gross per 24 hour  Intake 2597.53 ml  Output 1590 ml  Net 1007.53 ml   Filed Weights   09/08/20 1133  Weight: 113.8 kg    Examination:  Constitutional: He is in no distress Eyes: No icterus ENMT: mmm Neck: normal, supple Respiratory: Lungs are clear bilaterally, no wheezing, no crackles Cardiovascular: Regular rate and rhythm, no murmurs, no peripheral edema Abdomen: Soft, nontender, nondistended, bowel sounds positive Musculoskeletal: no clubbing / cyanosis.  Skin: No rashes appreciated Neurologic: No focal deficits  Data Reviewed: I have independently reviewed following labs and imaging studies   CBC: Recent Labs  Lab 09/08/20 1136 09/08/20 1208 09/09/20 0209 09/10/20 0330  WBC 8.3  --  10.2 9.9  NEUTROABS 7.8*  --   --   --   HGB 13.5 14.3 12.3* 11.7*  HCT 41.6 42.0 36.9* 35.2*  MCV 83.0  --  81.3 82.8  PLT 176  --  102* 161*   Basic Metabolic Panel: Recent Labs  Lab 09/08/20 1136 09/08/20 1208 09/08/20 2120 09/09/20 0209 09/10/20 0330 09/11/20 0445  NA 134* 138  --  135 139 138  K 3.9 3.0*  --  3.2* 3.3* 4.2  CL 102 101  --  103 108 109  CO2 20*  --   --  20* 23 19*  GLUCOSE 98 98  --  84 108* 121*  BUN 17 16  --  17 17 12   CREATININE 1.23 1.00  --  1.12 1.27* 1.00  CALCIUM 9.0  --   --  8.7* 8.8* 8.8*  MG  --   --  1.7 1.5*  --   --   PHOS  --   --   --  1.6*  --   --    Liver Function Tests: Recent Labs  Lab 09/08/20 1136 09/10/20 0330  AST 50* 25  ALT 28 21  ALKPHOS 74 57  BILITOT 2.3* 0.8  PROT 6.7 5.6*  ALBUMIN 4.0 2.9*   Coagulation Profile: Recent Labs  Lab 09/08/20 1136  INR 1.0   HbA1C: No results for input(s): HGBA1C in the last 72 hours. CBG: Recent Labs  Lab 09/08/20 1129  GLUCAP 121*    Recent Results (from the past 240 hour(s))  Resp Panel by RT-PCR  (Flu A&B, Covid) Nasopharyngeal Swab     Status: None   Collection Time: 09/08/20 11:36 AM   Specimen: Nasopharyngeal Swab; Nasopharyngeal(NP) swabs in vial transport medium  Result Value Ref Range Status   SARS Coronavirus 2 by RT PCR NEGATIVE NEGATIVE Final    Comment: (NOTE) SARS-CoV-2 target nucleic acids are NOT DETECTED.  The SARS-CoV-2 RNA is generally detectable in upper respiratory specimens during the acute phase of infection. The lowest concentration of SARS-CoV-2 viral copies this  assay can detect is 138 copies/mL. A negative result does not preclude SARS-Cov-2 infection and should not be used as the sole basis for treatment or other patient management decisions. A negative result may occur with  improper specimen collection/handling, submission of specimen other than nasopharyngeal swab, presence of viral mutation(s) within the areas targeted by this assay, and inadequate number of viral copies(<138 copies/mL). A negative result must be combined with clinical observations, patient history, and epidemiological information. The expected result is Negative.  Fact Sheet for Patients:  EntrepreneurPulse.com.au  Fact Sheet for Healthcare Providers:  IncredibleEmployment.be  This test is no t yet approved or cleared by the [place] FDA and  has been authorized for detection and/or diagnosis of SARS-CoV-2 by FDA under an Emergency Use Authorization (EUA). This EUA will remain  in effect (meaning this test can be used) for the duration of the COVID-19 declaration under Section 564(b)(1) of the Act, 21 U.S.C.section 360bbb-3(b)(1), unless the authorization is terminated  or revoked sooner.       Influenza A by PCR NEGATIVE NEGATIVE Final   Influenza B by PCR NEGATIVE NEGATIVE Final    Comment: (NOTE) The Xpert Xpress SARS-CoV-2/FLU/RSV plus assay is intended as an aid in the diagnosis of influenza from Nasopharyngeal swab specimens  and should not be used as a sole basis for treatment. Nasal washings and aspirates are unacceptable for Xpert Xpress SARS-CoV-2/FLU/RSV testing.  Fact Sheet for Patients: EntrepreneurPulse.com.au  Fact Sheet for Healthcare Providers: IncredibleEmployment.be  This test is not yet approved or cleared by the [place] FDA and has been authorized for detection and/or diagnosis of SARS-CoV-2 by FDA under an Emergency Use Authorization (EUA). This EUA will remain in effect (meaning this test can be used) for the duration of the COVID-19 declaration under Section 564(b)(1) of the Act, 21 U.S.C. section 360bbb-3(b)(1), unless the authorization is terminated or revoked.  Performed at [facility], [place] [address], [place], [place] [address]   Blood Culture (routine x 2)     Status: None (Preliminary result)   Collection Time: 09/08/20 11:36 AM   Specimen: BLOOD RIGHT HAND  Result Value Ref Range Status   Specimen Description BLOOD RIGHT HAND  Final   Special Requests   Final    BOTTLES DRAWN AEROBIC AND ANAEROBIC Blood Culture adequate volume   Culture   Final    NO GROWTH 2 DAYS Performed at [facility], [place] [address], [place], [place] [address]    Report Status PENDING  Incomplete  Blood Culture (routine x 2)     Status: None (Preliminary result)   Collection Time: 09/08/20 11:41 AM   Specimen: BLOOD RIGHT FOREARM  Result Value Ref Range Status   Specimen Description BLOOD RIGHT FOREARM  Final   Special Requests   Final    BOTTLES DRAWN AEROBIC AND ANAEROBIC Blood Culture adequate volume   Culture   Final    NO GROWTH 2 DAYS Performed at [facility], [place] [address] [place], [place] [address]    Report Status PENDING  Incomplete  Urine culture     Status: None   Collection Time: 09/08/20  3:42 PM   Specimen: In/Out Cath Urine  Result Value Ref Range Status   Specimen Description IN/OUT CATH URINE  Final   Special  Requests NONE  Final   Culture   Final    NO GROWTH Performed at [facility], [place] [address], [place], [place] [address]  Report Status 09/09/2020 FINAL  Final  CSF culture w Gram Stain     Status: None (Preliminary result)   Collection Time: 09/10/20  2:18 PM   Specimen: PATH Cytology CSF; Cerebrospinal Fluid  Result Value Ref Range Status   Specimen Description CSF  Final   Special Requests NONE  Final   Gram Stain   Final    WBC PRESENT,BOTH PMN AND MONONUCLEAR NO ORGANISMS SEEN CYTOSPIN SMEAR    Culture   Final    NO GROWTH < 24 HOURS Performed at [facility], [place] [address] [place], [place] [address]    Report Status PENDING  Incomplete     Radiology Studies: DG FL GUIDED LUMBAR PUNCTURE  Result Date: 09/10/2020 CLINICAL DATA:  Seizures.  Sepsis. EXAM: DIAGNOSTIC LUMBAR PUNCTURE UNDER FLUOROSCOPIC GUIDANCE COMPARISON:  Lumbar spine CT from 04/23/2019 was reviewed. FLUOROSCOPY TIME:  Fluoroscopy Time:  0 minutes and 48 seconds. Radiation Exposure Index (if provided by the fluoroscopic device): 55.4 mGy Number of Acquired Spot Images: PROCEDURE: Written informed consent was obtained from the patient prior to the procedure, including potential complications of bleeding, infection, inability to obtain CSF, and headache. Patient voiced understanding and he and his daughter were provided opportunity to ask questions. Prior to starting the procedure, a formal time-out was performed. Using fluoroscopic guidance, appropriate skin sites over the L5-S1 level (patient noted at transitional anatomy) and L2-3 level were identified as thecal sac was markedly narrow at other levels. With the patient prone, the lower back was prepped and draped in the usual sterile fashion. 1% Lidocaine was used for local anesthesia. Lumbar puncture was performed at the L5-S1 level using a 20 gauge needle with return of yellow tinged CSF with an opening pressure of 23-24 cm water. CSF space was  accessed on the first pass with brisk return. After approximately 5-6 cc of CSF was obtained, the spinal needle and tubing filled with blood and no further CSF could be obtained despite multiple repeated attempts at needle repositioning. This was discussed with the patient and as he was comfortable, he agreed to allow an additional punctur at the L2-3 level in an attempt to obtain additional CSF for laboratory analysis. Multiple attempts at the L2-3 level did not obtain additional CSF. IMPRESSION: Successful fluoro guided lumbar puncture at the L5-S1 level although only about 5-6 cc of CSF was obtained. I discussed the limited volume obtained with Dr. Cruzita Lederer immediately after the procedure was completed. Patient tolerated the procedure well without evidence for immediate complication. Electronically Signed   By: Misty Stanley M.D.   On: 09/10/2020 15:27    Marzetta Board, MD, PhD [facility]  Between 7 am - 7 pm I am available, please contact me via Amion (for emergencies) or Securechat (non urgent messages)  Between 7 pm - 7 am I am not available, please contact night coverage MD/APP via Amion

## 2020-09-11 NOTE — Plan of Care (Signed)
  Problem: Safety: Goal: Non-violent Restraint(s) 09/11/2020 1716 by Drucie Ip I, RN Outcome: Progressing 09/11/2020 1626 by Drucie Ip I, RN Outcome: Progressing   Problem: Education: Goal: Expressions of having a comfortable level of knowledge regarding the disease process will increase 09/11/2020 1716 by Drucie Ip I, RN Outcome: Progressing 09/11/2020 1626 by Drucie Ip I, RN Outcome: Progressing   Problem: Coping: Goal: Ability to adjust to condition or change in health will improve 09/11/2020 1716 by Drucie Ip I, RN Outcome: Progressing 09/11/2020 1626 by Drucie Ip I, RN Outcome: Progressing Goal: Ability to identify appropriate support needs will improve 09/11/2020 1716 by Drucie Ip I, RN Outcome: Progressing 09/11/2020 1626 by Drucie Ip I, RN Outcome: Progressing   Problem: Health Behavior/Discharge Planning: Goal: Compliance with prescribed medication regimen will improve 09/11/2020 1716 by Drucie Ip I, RN Outcome: Progressing 09/11/2020 1626 by Drucie Ip I, RN Outcome: Progressing   Problem: Medication: Goal: Risk for medication side effects will decrease 09/11/2020 1716 by Drucie Ip I, RN Outcome: Progressing 09/11/2020 1626 by Drucie Ip I, RN Outcome: Progressing   Problem: Clinical Measurements: Goal: Complications related to the disease process, condition or treatment will be avoided or minimized 09/11/2020 1716 by Drucie Ip I, RN Outcome: Progressing 09/11/2020 1626 by Drucie Ip I, RN Outcome: Progressing Goal: Diagnostic test results will improve 09/11/2020 1716 by Drucie Ip I, RN Outcome: Progressing 09/11/2020 1626 by Drucie Ip I, RN Outcome: Progressing   Problem: Safety: Goal: Verbalization of understanding the information provided will improve 09/11/2020 1716 by Drucie Ip I, RN Outcome: Progressing 09/11/2020 1626 by Drucie Ip I,  RN Outcome: Progressing   Problem: Self-Concept: Goal: Level of anxiety will decrease 09/11/2020 1716 by Drucie Ip I, RN Outcome: Progressing 09/11/2020 1626 by Drucie Ip I, RN Outcome: Progressing Goal: Ability to verbalize feelings about condition will improve 09/11/2020 1716 by Drucie Ip I, RN Outcome: Progressing 09/11/2020 1626 by Drucie Ip I, RN Outcome: Progressing

## 2020-09-11 NOTE — Progress Notes (Signed)
Pharmacy Antibiotic Note  Keith Dixon is a 60 y.o. male admitted on 09/08/2020 with seizures, altered mental status and possible sepsis.  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  -SCr has returned to baseline of 1, UOP not accurately documented -afebrile, WBC are normal, cultures neg so far -LP done 5/2, results pending  Plan: Increase vancomycin to 1gm IV q12h (esimated AUC 446, SCr 1) Goal AUC 400-600 Continue cefepime 2 g IV q8h Continue acyclovir 900 mg (~ 10mg /kg ABW) IV q8h Monitor renal function and clinical progress   Height: 6' (182.9 cm) (prior encounter) Weight: 113.8 kg (250 lb 14.1 oz) IBW/kg (Calculated) : 77.6  Temp (24hrs), Avg:98.2 F (36.8 C), Min:97.9 F (36.6 C), Max:98.4 F (36.9 C)  Recent Labs  Lab 09/08/20 1136 09/08/20 1208 09/08/20 1612 09/09/20 0209 09/10/20 0330 09/11/20 0445  WBC 8.3  --   --  10.2 9.9  --   CREATININE 1.23 1.00  --  1.12 1.27* 1.00  LATICACIDVEN 3.6*  --  1.7  --   --   --     Estimated Creatinine Clearance: 106.2 mL/min (by C-G formula based on SCr of 1 mg/dL).    No Known Allergies  Antimicrobials this admission: Acyclovir 4/30 >> Cefepime 4/30 >>  Vancomycin 4/30 >>  Metronidazole 4/30 x1  Microbiology results: 4/30BCx: ngtd 4/30 UCx (in/out cath): neg 5/2 CSF: ngtd  Thank you for involving pharmacy in this patient's care.  Horton Chin, PharmD, BCPS Clinical Pharmacist Clinical phone for 09/11/2020 until 3p is [phone] 09/11/2020 9:35 AM  **Pharmacist phone directory can be found on [email or web address] listed under [place]**

## 2020-09-11 NOTE — Evaluation (Signed)
Physical Therapy Evaluation Patient Details Name: Keith Dixon MRN: [id number] DOB: [birth year] Today's Date: 09/11/2020   History of Present Illness  Pt is a 60 y.o. M who presents with 3 breakthrough seizures in setting of possible sepsis. No further seizures after starting empiric abx coverage. EEGs on 5/1 and 5/2 were normal. Significant PMH: epilepsy, bipolar disorder, PTSD, HTN.  Clinical Impression  Prior to admission, pt independent with ADL's and mobility using a cane. Has chronic bilateral hip pain. Pt presents with decreased functional mobility secondary to weakness, decreased endurance, balance deficits, and chronic hip pain. Ambulating x 120 feet with a walker at a min guard assist level. HR 87-106 bpm. Demonstrates flexed posture and slowed gait speed. Would benefit from HHPT to address deficits and maximize functional mobility.    Follow Up Recommendations Home health PT;Supervision for mobility/OOB    Equipment Recommendations  Rolling walker with 5" wheels    Recommendations for Other Services       Precautions / Restrictions Precautions Precautions: Fall;Other (comment) Precaution Comments: seizure precautions Restrictions Weight Bearing Restrictions: No      Mobility  Bed Mobility Overal bed mobility: Needs Assistance Bed Mobility: Supine to Sit;Sit to Supine     Supine to sit: Supervision Sit to supine: Supervision   General bed mobility comments: Supervision for safety, no physical A required    Transfers Overall transfer level: Needs assistance Equipment used: Rolling walker (2 wheeled) Transfers: Sit to/from Stand Sit to Stand: Min guard         General transfer comment: Slow to rise, increased trunk flexion  Ambulation/Gait Ambulation/Gait assistance: Min guard Gait Distance (Feet): 120 Feet Assistive device: Rolling walker (2 wheeled) Gait Pattern/deviations: Step-through pattern;Decreased stride length;Trunk flexed Gait velocity:  decreased   General Gait Details: Pt with increased trunk flexion, decreased bilateral foot clearance, no overt LOB. Limited due to chronic bilateral hip pain. Moderate use of arms on walker  Stairs            Wheelchair Mobility    Modified Rankin (Stroke Patients Only)       Balance Overall balance assessment: Needs assistance Sitting-balance support: Feet supported Sitting balance-Leahy Scale: Good     Standing balance support: Bilateral upper extremity supported Standing balance-Leahy Scale: Poor Standing balance comment: reliant through arms on walker                             Pertinent Vitals/Pain Pain Assessment: Faces Faces Pain Scale: Hurts even more Pain Location: bilateral hips (chronic) Pain Descriptors / Indicators: Sore;Guarding Pain Intervention(s): Limited activity within patient's tolerance;Monitored during session    Home Living Family/patient expects to be discharged to:: Private residence Living Arrangements: Alone Available Help at Discharge: Family;Friend(s) (girlfriend, daughter) Type of Home: Apartment Home Access: Level entry     Home Layout: Able to live on main level with bedroom/bathroom Home Equipment: Kasandra Knudsen - single point      Prior Function Level of Independence: Needs assistance   Gait / Transfers Assistance Needed: using cane, denies falls.  ADL's / Homemaking Assistance Needed: daughter assisting with medications  Comments: does not drive, independent IADL's     Hand Dominance        Extremity/Trunk Assessment   Upper Extremity Assessment Upper Extremity Assessment: Overall WFL for tasks assessed    Lower Extremity Assessment Lower Extremity Assessment: Generalized weakness       Communication   Communication: No difficulties  Cognition Arousal/Alertness: Awake/alert  Behavior During Therapy: WFL for tasks assessed/performed Overall Cognitive Status: Impaired/Different from baseline Area of  Impairment: Awareness;Safety/judgement                         Safety/Judgement: Decreased awareness of deficits Awareness: Emergent   General Comments: Psych hx      General Comments      Exercises     Assessment/Plan    PT Assessment Patient needs continued PT services  PT Problem List Decreased strength;Decreased activity tolerance;Decreased balance;Decreased mobility;Pain       PT Treatment Interventions DME instruction;Gait training;Stair training;Functional mobility training;Therapeutic exercise;Therapeutic activities;Balance training;Patient/family education    PT Goals (Current goals can be found in the Care Plan section)  Acute Rehab PT Goals Patient Stated Goal: get out of bed PT Goal Formulation: With patient Time For Goal Achievement: 09/25/20 Potential to Achieve Goals: Good    Frequency Min 3X/week   Barriers to discharge        Co-evaluation               AM-PAC PT "6 Clicks" Mobility  Outcome Measure Help needed turning from your back to your side while in a flat bed without using bedrails?: None Help needed moving from lying on your back to sitting on the side of a flat bed without using bedrails?: A Little Help needed moving to and from a bed to a chair (including a wheelchair)?: A Little Help needed standing up from a chair using your arms (e.g., wheelchair or bedside chair)?: A Little Help needed to walk in hospital room?: A Little Help needed climbing 3-5 steps with a railing? : A Little 6 Click Score: 19    End of Session Equipment Utilized During Treatment: Gait belt Activity Tolerance: Patient tolerated treatment well Patient left: in bed;with call bell/phone within reach;with bed alarm set Nurse Communication: Mobility status PT Visit Diagnosis: Unsteadiness on feet (R26.81);Muscle weakness (generalized) (M62.81);Difficulty in walking, not elsewhere classified (R26.2)    Time: 9562-1308 PT Time Calculation (min) (ACUTE  ONLY): 18 min   Charges:   PT Evaluation $PT Eval Moderate Complexity: 1 Mod          Wyona Almas, PT, DPT Acute Rehabilitation Services Pager [phone] Office [place] 09/11/2020, 5:20 PM

## 2020-09-11 NOTE — Progress Notes (Addendum)
Subjective: No acute overnight events, no further seizure activity noted Patient does endorse taking Lamotrigine at home but he is unsure of the dose (states he thinks he is taking it twice daily); per patient his daughter helps him with ordering the correct medication  Objective: Current vital signs: BP (!) 155/79 (BP Location: Right Arm)   Pulse 71   Temp 98.2 F (36.8 C) (Oral)   Resp (!) 24   Ht 6' (1.829 m) Comment: prior encounter  Wt 113.8 kg   SpO2 100%   BMI 34.03 kg/m  Vital signs in last 24 hours: Temp:  [97.9 F (36.6 C)-98.4 F (36.9 C)] 98.2 F (36.8 C) (05/03 0747) Pulse Rate:  [71-85] 71 (05/03 0747) Resp:  [11-24] 24 (05/03 0747) BP: (141-167)/(79-92) 155/79 (05/03 0747) SpO2:  [99 %-100 %] 100 % (05/03 0747)  Intake/Output from previous day: 05/02 0701 - 05/03 0700 In: 2597.5 [IV Piggyback:2597.5] Out: 1790 [Urine:1790] Intake/Output this shift: No intake/output data recorded. Nutritional status:  Diet Order            Diet Heart Room service appropriate? Yes; Fluid consistency: Thin  Diet effective now                Neurologic Exam: Mental Status: Awake, alert, and oriented x 4. Speech is intact without dysarthria or aphasia. No neglect is noted. Patient is able to provide a clear and coherent history. CN: PERRL 3 mm / brisk, visual fields full, EOMI, face is symmetric, sensation to face is intact and symmetric to light touch, hearing is intact to voice, phonation normal, palate rises symmetrically, shoulder shrug is symmetric, tongue protrudes midline Motor: 5/5 throughout without vertical drift. Tone and bulk are normal Sensory: Sensation intact and symmetric to light touch in bilateral upper and lower extremities DTR: 2+ and symmetric throughout Coordination: FNF intact Gait: Deferred   Lab Results: Results for orders placed or performed during the hospital encounter of 09/08/20 (from the past 48 hour(s))  Comprehensive metabolic panel      Status: Abnormal   Collection Time: 09/10/20  3:30 AM  Result Value Ref Range   Sodium 139 135 - 145 mmol/L   Potassium 3.3 (L) 3.5 - 5.1 mmol/L   Chloride 108 98 - 111 mmol/L   CO2 23 22 - 32 mmol/L   Glucose, Bld 108 (H) 70 - 99 mg/dL    Comment: Glucose reference range applies only to samples taken after fasting for at least 8 hours.   BUN 17 6 - 20 mg/dL   Creatinine, Ser 1.27 (H) 0.61 - 1.24 mg/dL   Calcium 8.8 (L) 8.9 - 10.3 mg/dL   Total Protein 5.6 (L) 6.5 - 8.1 g/dL   Albumin 2.9 (L) 3.5 - 5.0 g/dL   AST 25 15 - 41 U/L   ALT 21 0 - 44 U/L   Alkaline Phosphatase 57 38 - 126 U/L   Total Bilirubin 0.8 0.3 - 1.2 mg/dL   GFR, Estimated >60 >60 mL/min    Comment: (NOTE) Calculated using the CKD-EPI Creatinine Equation (2021)    Anion gap 8 5 - 15    Comment: Performed at [facility], [place] [address], [place] [address]  CBC     Status: Abnormal   Collection Time: 09/10/20  3:30 AM  Result Value Ref Range   WBC 9.9 4.0 - 10.5 K/uL   RBC 4.25 4.22 - 5.81 MIL/uL   Hemoglobin 11.7 (L) 13.0 - 17.0 g/dL   HCT 35.2 (  L) 39.0 - 52.0 %   MCV 82.8 80.0 - 100.0 fL   MCH 27.5 26.0 - 34.0 pg   MCHC 33.2 30.0 - 36.0 g/dL   RDW 16.1 (H) 11.5 - 15.5 %   Platelets 107 (L) 150 - 400 K/uL    Comment: Immature Platelet Fraction may be clinically indicated, consider ordering this additional test [id number] CONSISTENT WITH PREVIOUS RESULT REPEATED TO VERIFY    nRBC 0.0 0.0 - 0.2 %    Comment: Performed at [facility], [place] [address] [place], [place] [address]  Glucose, CSF     Status: None   Collection Time: 09/10/20  2:18 PM  Result Value Ref Range   Glucose, CSF 53 40 - 70 mg/dL    Comment: Performed at [facility] [address] [place], [place] [address]  Protein, CSF     Status: Abnormal   Collection Time: 09/10/20  2:18 PM  Result Value Ref Range   Total  Protein, CSF >600 (H) 15 - 45 mg/dL    Comment: RESULTS CONFIRMED BY MANUAL  DILUTION Performed at [facility] [address] [place], [place] [address]   CSF cell count with differential     Status: Abnormal   Collection Time: 09/10/20  2:18 PM  Result Value Ref Range   Tube # 1    Color, CSF STRAW (A) COLORLESS   Appearance, CSF CLEAR CLEAR   Supernatant XANTHOCHROMIC    RBC Count, CSF 455 (H) 0 /cu mm   WBC, CSF 8 (H) 0 - 5 /cu mm   Other Cells, CSF      FEW LYMPOCYTEES,FEW MONOCYTES AND FEW NEUTROPHILS NOTED    Comment: TOO FEW TO COUNT, SMEAR AVAILABLE FOR REVIEW Performed at [facility] [address] [place], [place] [address]   CSF culture w Gram Stain     Status: None (Preliminary result)   Collection Time: 09/10/20  2:18 PM   Specimen: PATH Cytology CSF; Cerebrospinal Fluid  Result Value Ref Range   Specimen Description CSF    Special Requests NONE    Gram Stain      WBC PRESENT,BOTH PMN AND MONONUCLEAR NO ORGANISMS SEEN CYTOSPIN SMEAR Performed at [facility], [place] [address], [place],  [address]    Culture PENDING    Report Status PENDING   Cryptococcal antigen, CSF     Status: None   Collection Time: 09/10/20  2:18 PM  Result Value Ref Range   Crypto Ag NEGATIVE NEGATIVE   Cryptococcal Ag Titer NOT INDICATED NOT INDICATED    Comment: Performed at [facility], [place] [address], [place],  [id number]  Basic metabolic panel     Status: Abnormal   Collection Time: 09/11/20  4:45 AM  Result Value Ref Range   Sodium 138 135 - 145 mmol/L   Potassium 4.2 3.5 - 5.1 mmol/L   Chloride 109 98 - 111 mmol/L   CO2 19 (L) 22 - 32 mmol/L   Glucose, Bld 121 (H) 70 - 99 mg/dL    Comment: Glucose reference range applies only to samples taken after fasting for at least 8 hours.   BUN 12 6 - 20 mg/dL   Creatinine, Ser 1.00 0.61 - 1.24 mg/dL   Calcium 8.8 (L) 8.9 - 10.3 mg/dL   GFR, Estimated >60 >60 mL/min    Comment: (NOTE) Calculated using the CKD-EPI Creatinine Equation (2021)    Anion gap 10 5 -  15     Comment: Performed at [facility], [place] [address] [place], [place] [address]    Recent Results (from the past 240 hour(s))  Resp Panel by RT-PCR (Flu A&B, Covid) Nasopharyngeal Swab     Status: None   Collection Time: 09/08/20 11:36 AM   Specimen: Nasopharyngeal Swab; Nasopharyngeal(NP) swabs in vial transport medium  Result Value Ref Range Status   SARS Coronavirus 2 by RT PCR NEGATIVE NEGATIVE Final    Comment: (NOTE) SARS-CoV-2 target nucleic acids are NOT DETECTED.  The SARS-CoV-2 RNA is generally detectable in upper respiratory specimens during the acute phase of infection. The lowest concentration of SARS-CoV-2 viral copies this assay can detect is 138 copies/mL. A negative result does not preclude SARS-Cov-2 infection and should not be used as the sole basis for treatment or other patient management decisions. A negative result may occur with  improper specimen collection/handling, submission of specimen other than nasopharyngeal swab, presence of viral mutation(s) within the areas targeted by this assay, and inadequate number of viral copies(<138 copies/mL). A negative result must be combined with clinical observations, patient history, and epidemiological information. The expected result is Negative.  Fact Sheet for Patients:  EntrepreneurPulse.com.au  Fact Sheet for Healthcare Providers:  IncredibleEmployment.be  This test is no t yet approved or cleared by the [place] FDA and  has been authorized for detection and/or diagnosis of SARS-CoV-2 by FDA under an Emergency Use Authorization (EUA). This EUA will remain  in effect (meaning this test can be used) for the duration of the COVID-19 declaration under Section 564(b)(1) of the Act, 21 U.S.C.section 360bbb-3(b)(1), unless the authorization is terminated  or revoked sooner.       Influenza A by PCR NEGATIVE NEGATIVE Final   Influenza B by PCR NEGATIVE NEGATIVE Final     Comment: (NOTE) The Xpert Xpress SARS-CoV-2/FLU/RSV plus assay is intended as an aid in the diagnosis of influenza from Nasopharyngeal swab specimens and should not be used as a sole basis for treatment. Nasal washings and aspirates are unacceptable for Xpert Xpress SARS-CoV-2/FLU/RSV testing.  Fact Sheet for Patients: EntrepreneurPulse.com.au  Fact Sheet for Healthcare Providers: IncredibleEmployment.be  This test is not yet approved or cleared by the [place] FDA and has been authorized for detection and/or diagnosis of SARS-CoV-2 by FDA under an Emergency Use Authorization (EUA). This EUA will remain in effect (meaning this test can be used) for the duration of the COVID-19 declaration under Section 564(b)(1) of the Act, 21 U.S.C. section 360bbb-3(b)(1), unless the authorization is terminated or revoked.  Performed at [facility], [place] [address], [place], [place] [address]   Blood Culture (routine x 2)     Status: None (Preliminary result)   Collection Time: 09/08/20 11:36 AM   Specimen: BLOOD RIGHT HAND  Result Value Ref Range Status   Specimen Description BLOOD RIGHT HAND  Final   Special Requests   Final    BOTTLES DRAWN AEROBIC AND ANAEROBIC Blood Culture adequate volume   Culture   Final    NO GROWTH 2 DAYS Performed at [facility], [place] [address] [place], [place] [address]    Report Status PENDING  Incomplete  Blood Culture (routine x 2)     Status: None (Preliminary result)   Collection Time: 09/08/20 11:41 AM   Specimen: BLOOD RIGHT FOREARM  Result Value Ref Range Status   Specimen Description BLOOD RIGHT FOREARM  Final   Special Requests   Final  BOTTLES DRAWN AEROBIC AND ANAEROBIC Blood Culture adequate volume   Culture   Final    NO GROWTH 2 DAYS Performed at [facility], [place] [address], [place], [place] [address]    Report Status PENDING  Incomplete  Urine culture     Status: None    Collection Time: 09/08/20  3:42 PM   Specimen: In/Out Cath Urine  Result Value Ref Range Status   Specimen Description IN/OUT CATH URINE  Final   Special Requests NONE  Final   Culture   Final    NO GROWTH Performed at [facility], [place] [address], [place], [place] [address]    Report Status 09/09/2020 FINAL  Final  CSF culture w Gram Stain     Status: None (Preliminary result)   Collection Time: 09/10/20  2:18 PM   Specimen: PATH Cytology CSF; Cerebrospinal Fluid  Result Value Ref Range Status   Specimen Description CSF  Final   Special Requests NONE  Final   Gram Stain   Final    WBC PRESENT,BOTH PMN AND MONONUCLEAR NO ORGANISMS SEEN CYTOSPIN SMEAR Performed at [facility], [place] [address], [place], [place] [address]    Culture PENDING  Incomplete   Report Status PENDING  Incomplete    Lipid Panel No results for input(s): CHOL, TRIG, HDL, CHOLHDL, VLDL, LDLCALC in the last 72 hours.  Studies/Results: EEG adult  Result Date: 09/09/2020 Lora Havens, MD     09/09/2020 10:51 AM Patient Name: JOVE BEYL MRN: [id number] Epilepsy Attending: Lora Havens Referring Physician/Provider: Dr Su Monks Date: 09/09/2020 Duration: 25.30 mins Patient history: 60 year old man with a history of epilepsy, bipolar d/o, PTSD, HTN who presents with 3 breakthrough seizures today in the setting of possible sepsis. EEG to evaluate for seizure. Level of alertness: Awake,asleep AEDs during EEG study: LEV, LTG, GBP Technical aspects: This EEG study was done with scalp electrodes positioned according to the 10-20 International system of electrode placement. Electrical activity was acquired at a sampling rate of 500Hz  and reviewed with a high frequency filter of 70Hz  and a low frequency filter of 1Hz . EEG data were recorded continuously and digitally stored. Description: The posterior dominant rhythm consists of 8 Hz activity of moderate voltage (25-35 uV) seen predominantly in posterior  head regions, symmetric and reactive to eye opening and eye closing. Sleep was characterized by vertex waves, sleep spindles (12 to 14 Hz), maximal frontocentral region. Single sharp transient was noted in left frontal region. Hyperventilation and photic stimulation were not performed.   IMPRESSION: This study is within normal limits. No seizures or definite epileptiform discharges were seen throughout the recording. Priyanka Barbra Sarks   Overnight EEG with video  Result Date: 09/10/2020 Lora Havens, MD     09/10/2020  1:40 PM Patient Name: KALIJAH ZEISS MRN: [id number] Epilepsy Attending: Lora Havens Referring Physician/Provider: Dr Su Monks Duration: 09/09/2020 0942 to 09/10/2020 1129  Patient history: 60 year old man with a history of epilepsy, bipolar d/o, PTSD, HTN who presents with 3 breakthrough seizures today in the setting of possible sepsis. EEG to evaluate for seizure.  Level of alertness: Awake,asleep  AEDs during EEG study: LEV, GBP  Technical aspects: This EEG study was done with scalp electrodes positioned according to the 10-20 International system of electrode placement. Electrical activity was acquired at a sampling rate of 500Hz  and reviewed with a high frequency filter of 70Hz  and a low frequency filter of 1Hz . EEG data were  recorded continuously and digitally stored.  Description: The posterior dominant rhythm consists of 8 Hz activity of moderate voltage (25-35 uV) seen predominantly in posterior head regions, symmetric and reactive to eye opening and eye closing. Sleep was characterized by vertex waves, sleep spindles (12 to 14 Hz), maximal frontocentral region.  Small sharp spikes were noted in her right fronto-temporal region. Hyperventilation and photic stimulation were not performed.    IMPRESSION: This study is within normal limits. No seizures or definite epileptiform discharges were seen throughout the recording.   Walker FL GUIDED LUMBAR  PUNCTURE  Result Date: 09/10/2020 CLINICAL DATA:  Seizures.  Sepsis. EXAM: DIAGNOSTIC LUMBAR PUNCTURE UNDER FLUOROSCOPIC GUIDANCE COMPARISON:  Lumbar spine CT from 04/23/2019 was reviewed. FLUOROSCOPY TIME:  Fluoroscopy Time:  0 minutes and 48 seconds. Radiation Exposure Index (if provided by the fluoroscopic device): 55.4 mGy Number of Acquired Spot Images: PROCEDURE: Written informed consent was obtained from the patient prior to the procedure, including potential complications of bleeding, infection, inability to obtain CSF, and headache. Patient voiced understanding and he and his daughter were provided opportunity to ask questions. Prior to starting the procedure, a formal time-out was performed. Using fluoroscopic guidance, appropriate skin sites over the L5-S1 level (patient noted at transitional anatomy) and L2-3 level were identified as thecal sac was markedly narrow at other levels. With the patient prone, the lower back was prepped and draped in the usual sterile fashion. 1% Lidocaine was used for local anesthesia. Lumbar puncture was performed at the L5-S1 level using a 20 gauge needle with return of yellow tinged CSF with an opening pressure of 23-24 cm water. CSF space was accessed on the first pass with brisk return. After approximately 5-6 cc of CSF was obtained, the spinal needle and tubing filled with blood and no further CSF could be obtained despite multiple repeated attempts at needle repositioning. This was discussed with the patient and as he was comfortable, he agreed to allow an additional punctur at the L2-3 level in an attempt to obtain additional CSF for laboratory analysis. Multiple attempts at the L2-3 level did not obtain additional CSF. IMPRESSION: Successful fluoro guided lumbar puncture at the L5-S1 level although only about 5-6 cc of CSF was obtained. I discussed the limited volume obtained with Dr. Cruzita Lederer immediately after the procedure was completed. Patient tolerated the  procedure well without evidence for immediate complication. Electronically Signed   By: Misty Stanley M.D.   On: 09/10/2020 15:27    Medications:  Scheduled: . amLODipine  10 mg Oral Daily  . gabapentin  600 mg Oral BID  . nicotine  21 mg Transdermal Daily  . QUEtiapine  100 mg Oral QHS  . sertraline  100 mg Oral QPC breakfast   Continuous: . acyclovir 900 mg (09/11/20 0546)  . ceFEPime (MAXIPIME) IV 2 g (09/11/20 0508)  . levETIRAcetam 1,500 mg (09/10/20 2333)  . vancomycin 750 mg (09/11/20 0126)      Assessment: A 60 year old male with a history of epilepsy, bipolar d/o, PTSD and HTN who presents with 3 breakthrough seizures in the setting of possible sepsis. No further sz after starting empiric abx coverage.  1. Exam today reveals patient without neurologic deficits, back to baseline mental status and without further reports of seizure activity since hospitalization and initiation of empiric  2. CSF with markedly elevated protein at > 600. Mild elevation of WBC at 8. Xanthochromic supernatant. RBC count was 455. Overall findings most consistent with recent subarachnoid  hemorrhage. However, CT head did not show evidence of SAH. Will need MRI brain to further assess as this modality is more sensitive for subarachnoid blood than CT. Blood in CSF could also be due to HSV encephalitis. Will also need CTA of head to assess for possible intracranial aneurysm.  3. EEGs on 5/1 and 5/2 were normal. 4. Lamotrigine level came back at 2.4, which is at the bottom end of the therapeutic range (drawn on 4/30).  5. CSF cryptococcal antigen negative.   Recommendations: - Continue empiric coverage for now, however bacterial CNS infection less likely given only 8 WBC in CSF. May need ID to make final decision on whether to discontinue cefepime and vancomycin.  - Would continue acyclovir while awaiting HSV 1/2 PCR on CSF (ordered as add-on to already-collected CSF sample). - Continue Keppra at 1500 mg  IV q 12 hrs.  - [facility] and restart home AEDs. Patient cannot tell Neurology consultant if he was still on lamotrigine - he thought it may have been discontinued. Do not start lamotrigine until we are able to confirm patient is currently taking and at what dose. Abrupt initiation of high-dose lamotrigine is associated with increased risk Stevens-Johnson syndrome.  - Will need Pharmacy consult to obtain records from [place] on his anticonvulsant regimen prescribed there. Please call Pharmacy for assistance with this.   - MRI brain with and without contrast (ordered) - CTA of head (ordered)  Addendum: - Lamotrigine per Pharmacy is prescribed at 200 mg qAM, 300 mg qPM, per [facility] records - Since it is unknown if the patient was compliant, will need to restart Lamotrigine gradually as follows: 25 mg qd x 1 week, then 25 mg BID x 1 week, then 25 mg QAM and 50 mg QHS x 1 week, then 50 mg BID x 1 week. Following this initial titration schedule, will need to return to his primary Neurologist for continued instructions.    LOS: 3 days   @Electronically  signed: Dr. Kerney Elbe 09/11/2020  8:33 AM

## 2020-09-11 NOTE — Plan of Care (Signed)

## 2020-09-11 NOTE — Progress Notes (Signed)
Pt's left arm appears to be infiltrated, PIV x2 out, extremity elevated, and ice pack applied.

## 2020-09-12 ENCOUNTER — Inpatient Hospital Stay (HOSPITAL_COMMUNITY): Payer: No Typology Code available for payment source

## 2020-09-12 DIAGNOSIS — R569 Unspecified convulsions: Secondary | ICD-10-CM | POA: Diagnosis not present

## 2020-09-12 DIAGNOSIS — I10 Essential (primary) hypertension: Secondary | ICD-10-CM | POA: Diagnosis not present

## 2020-09-12 LAB — BASIC METABOLIC PANEL
Anion gap: 2 — ABNORMAL LOW (ref 5–15)
BUN: 10 mg/dL (ref 6–20)
CO2: 26 mmol/L (ref 22–32)
Calcium: 8.9 mg/dL (ref 8.9–10.3)
Chloride: 111 mmol/L (ref 98–111)
Creatinine, Ser: 0.88 mg/dL (ref 0.61–1.24)
GFR, Estimated: >60 mL/min (ref >60)
Glucose, Bld: 114 mg/dL — ABNORMAL HIGH (ref 70–99)
Potassium: 3.8 mmol/L (ref 3.5–5.1)
Sodium: 139 mmol/L (ref 135–145)

## 2020-09-12 LAB — MAGNESIUM: Magnesium: 1.8 mg/dL (ref 1.7–2.4)

## 2020-09-12 LAB — PHOSPHORUS: Phosphorus: 3.6 mg/dL (ref 2.5–4.6)

## 2020-09-12 LAB — VARICELLA-ZOSTER BY PCR

## 2020-09-12 MED ORDER — LEVETIRACETAM 750 MG PO TABS
1500.0000 mg | ORAL_TABLET | Freq: Two times a day (BID) | ORAL | Status: DC
  Administered 2020-09-12 – 2020-09-14 (×5): 1500 mg via ORAL
  Filled 2020-09-12 (×5): qty 2

## 2020-09-12 MED ORDER — HYDRALAZINE HCL 20 MG/ML IJ SOLN
10.0000 mg | Freq: Four times a day (QID) | INTRAMUSCULAR | Status: DC | PRN
  Administered 2020-09-12 (×2): 10 mg via INTRAVENOUS
  Filled 2020-09-12 (×2): qty 1

## 2020-09-12 MED ORDER — LAMOTRIGINE 25 MG PO TABS
25.0000 mg | ORAL_TABLET | Freq: Every day | ORAL | Status: DC
  Administered 2020-09-12 – 2020-09-14 (×3): 25 mg via ORAL
  Filled 2020-09-12 (×3): qty 1

## 2020-09-12 NOTE — Progress Notes (Signed)
Physical Therapy Treatment Patient Details Name: Keith Dixon MRN: [id number] DOB: 11-05-1960 Today's Date: 09/12/2020    History of Present Illness Pt is a 60 y.o. M who presents with 3 breakthrough seizures in setting of possible sepsis. No further seizures after starting empiric abx coverage. EEGs on 5/1 and 5/2 were normal. Significant PMH: epilepsy, bipolar disorder, PTSD, HTN.    PT Comments    Pt with severe hip and back pain today, but agreeable to OOB mobility. Pt ambulatory in hallway with intermittent multimodal cues for form and safety with use of RW. Pt with significantly antalgic gait, with heavily forward flexed trunk. PT instructed pt in gentle back exercise for back and hip comfort, pt performs well. PT to continue to follow acutely.   Follow Up Recommendations  Home health PT;Supervision for mobility/OOB     Equipment Recommendations  Rolling walker with 5" wheels    Recommendations for Other Services       Precautions / Restrictions Precautions Precautions: Fall;Other (comment) Precaution Comments: seizure precautions Restrictions Weight Bearing Restrictions: No    Mobility  Bed Mobility Overal bed mobility: Needs Assistance Bed Mobility: Supine to Sit;Sit to Supine     Supine to sit: Supervision Sit to supine: Supervision   General bed mobility comments: for safety, increased time and effort with use of bed rails.    Transfers Overall transfer level: Needs assistance Equipment used: Rolling walker (2 wheeled) Transfers: Sit to/from Stand Sit to Stand: Min guard         General transfer comment: for safety, verbal cuing for correct hand placement when rising/sitting.  Ambulation/Gait Ambulation/Gait assistance: Min guard Gait Distance (Feet): 160 Feet Assistive device: Rolling walker (2 wheeled) Gait Pattern/deviations: Step-through pattern;Decreased stride length;Trunk flexed;Antalgic Gait velocity: decr   General Gait Details: Min  guard for safety, verbal cuing for upright posture with x2 tactile cues at trunk/sternum, placement in RW. Slowed gait, HRmax 110s   Stairs             Wheelchair Mobility    Modified Rankin (Stroke Patients Only)       Balance Overall balance assessment: Needs assistance Sitting-balance support: Feet supported Sitting balance-Leahy Scale: Good     Standing balance support: Bilateral upper extremity supported Standing balance-Leahy Scale: Poor Standing balance comment: reliant through arms on walker                            Cognition Arousal/Alertness: Awake/alert Behavior During Therapy: WFL for tasks assessed/performed Overall Cognitive Status: Impaired/Different from baseline Area of Impairment: Awareness;Safety/judgement                         Safety/Judgement: Decreased awareness of deficits Awareness: Emergent   General Comments: frequent cues for safety and posture during hallway navigation, pt laughing throughout session both appropriately and inappropriately      Exercises Other Exercises Other Exercises: seated marches, x5 bilaterally Other Exercises: cat/cow posture, in sitting, x3 each direction, to address LBP    General Comments        Pertinent Vitals/Pain Pain Assessment: 0-10 Pain Score: 10-Worst pain ever Pain Location: bilateral hips (chronic) Pain Descriptors / Indicators: Sore;Guarding Pain Intervention(s): Limited activity within patient's tolerance;Monitored during session;Repositioned;Patient requesting pain meds-RN notified    Home Living                      Prior Function  PT Goals (current goals can now be found in the care plan section) Acute Rehab PT Goals PT Goal Formulation: With patient Time For Goal Achievement: 09/25/20 Potential to Achieve Goals: Good Progress towards PT goals: Progressing toward goals    Frequency    Min 3X/week      PT Plan Current plan  remains appropriate    Co-evaluation              AM-PAC PT "6 Clicks" Mobility   Outcome Measure  Help needed turning from your back to your side while in a flat bed without using bedrails?: None Help needed moving from lying on your back to sitting on the side of a flat bed without using bedrails?: A Little Help needed moving to and from a bed to a chair (including a wheelchair)?: A Little Help needed standing up from a chair using your arms (e.g., wheelchair or bedside chair)?: A Little Help needed to walk in hospital room?: A Little Help needed climbing 3-5 steps with a railing? : A Little 6 Click Score: 19    End of Session Equipment Utilized During Treatment: Gait belt Activity Tolerance: Patient tolerated treatment well;Patient limited by pain Patient left: in bed;with call bell/phone within reach;with nursing/sitter in room (RN to set bed alarm) Nurse Communication: Mobility status PT Visit Diagnosis: Unsteadiness on feet (R26.81);Muscle weakness (generalized) (M62.81);Difficulty in walking, not elsewhere classified (R26.2)     Time: 5643-3295 PT Time Calculation (min) (ACUTE ONLY): 23 min  Charges:  $Gait Training: 8-22 mins $Therapeutic Activity: 8-22 mins                     Stacie Glaze, PT DPT Acute Rehabilitation Services Pager [phone]  Office [phone]    [place] 09/12/2020, 2:51 PM

## 2020-09-12 NOTE — Progress Notes (Addendum)
Subjective: No acute overnight events, no further seizure activity noted.  He tells me he is feeling light-headed right now.  SBP 193/99. He tells me he just received his morning medications.   Objective: Current vital signs: BP (!) 183/98 (BP Location: Right Arm)   Pulse 73   Temp 97.8 F (36.6 C) (Oral)   Resp 17   Ht 6' (1.829 m) Comment: prior encounter  Wt 113.8 kg   SpO2 100%   BMI 34.03 kg/m  Vital signs in last 24 hours: Temp:  [97.8 F (36.6 C)-98.7 F (37.1 C)] 97.8 F (36.6 C) (05/04 0733) Pulse Rate:  [71-76] 73 (05/04 0733) Resp:  [17-21] 17 (05/04 0733) BP: (154-190)/(95-102) 183/98 (05/04 0733) SpO2:  [100 %] 100 % (05/04 0733)  Intake/Output from previous day: 05/03 0701 - 05/04 0700 In: 1604.6 [IV Piggyback:1604.6] Out: 2550 [Urine:2550] Intake/Output this shift: No intake/output data recorded. Nutritional status:  Diet Order            Diet Heart Room service appropriate? Yes; Fluid consistency: Thin  Diet effective now                Neurologic Exam: Mental Status: Awake, alert, and oriented x 4.  Speech is intact without dysarthria or aphasia.  No neglect is noted.  Patient is able to provide a clear and coherent history. CN: PERRL 3 mm / brisk, visual fields full, EOMI, face is symmetric, sensation to face is intact and symmetric to light touch, hearing is intact to voice, phonation normal, palate rises symmetrically, shoulder shrug is symmetric, tongue protrudes midline Motor: 5/5 throughout without vertical drift. Tone and bulk are normal Sensory: Sensation intact and symmetric to light touch in bilateral upper and lower extremities Coordination: FNF intact Gait: Deferred   Lab Results: Results for orders placed or performed during the hospital encounter of 09/08/20 (from the past 48 hour(s))  Glucose, CSF     Status: None   Collection Time: 09/10/20  2:18 PM  Result Value Ref Range   Glucose, CSF 53 40 - 70 mg/dL    Comment: Performed  at [facility], [address], [place], [place] [address]  Protein, CSF     Status: Abnormal   Collection Time: 09/10/20  2:18 PM  Result Value Ref Range   Total  Protein, CSF >600 (H) 15 - 45 mg/dL    Comment: RESULTS CONFIRMED BY MANUAL DILUTION Performed at [facility] [address], [place], [place] [address]   CSF cell count with differential     Status: Abnormal   Collection Time: 09/10/20  2:18 PM  Result Value Ref Range   Tube # 1    Color, CSF STRAW (A) COLORLESS   Appearance, CSF CLEAR CLEAR   Supernatant XANTHOCHROMIC    RBC Count, CSF 455 (H) 0 /cu mm   WBC, CSF 8 (H) 0 - 5 /cu mm   Other Cells, CSF      FEW LYMPOCYTEES,FEW MONOCYTES AND FEW NEUTROPHILS NOTED    Comment: TOO FEW TO COUNT, SMEAR AVAILABLE FOR REVIEW Performed at [facility] [address], [place],  [address]   CSF culture w Gram Stain     Status: None (Preliminary result)   Collection Time: 09/10/20  2:18 PM   Specimen: PATH Cytology CSF; Cerebrospinal Fluid  Result Value Ref Range   Specimen Description CSF    Special Requests NONE    Gram Stain      WBC  PRESENT,BOTH PMN AND MONONUCLEAR NO ORGANISMS SEEN CYTOSPIN SMEAR    Culture      NO GROWTH 2 DAYS Performed at [facility], [place] [address] [place], [place] [address]    Report Status PENDING   Cryptococcal antigen, CSF     Status: None   Collection Time: 09/10/20  2:18 PM  Result Value Ref Range   Crypto Ag NEGATIVE NEGATIVE   Cryptococcal Ag Titer NOT INDICATED NOT INDICATED    Comment: Performed at [facility], [place] [address], [place], [place] [id number]  Basic metabolic panel     Status: Abnormal   Collection Time: 09/11/20  4:45 AM  Result Value Ref Range   Sodium 138 135 - 145 mmol/L   Potassium 4.2 3.5 - 5.1 mmol/L   Chloride 109 98 - 111 mmol/L   CO2 19 (L) 22 - 32 mmol/L   Glucose, Bld 121 (H) 70 - 99 mg/dL    Comment: Glucose reference range applies only to samples taken after  fasting for at least 8 hours.   BUN 12 6 - 20 mg/dL   Creatinine, Ser 1.00 0.61 - 1.24 mg/dL   Calcium 8.8 (L) 8.9 - 10.3 mg/dL   GFR, Estimated >60 >60 mL/min    Comment: (NOTE) Calculated using the CKD-EPI Creatinine Equation (2021)    Anion gap 10 5 - 15    Comment: Performed at [facility] [address], [place], [place] [id number]  Basic metabolic panel     Status: Abnormal   Collection Time: 09/12/20  3:53 AM  Result Value Ref Range   Sodium 139 135 - 145 mmol/L   Potassium 3.8 3.5 - 5.1 mmol/L   Chloride 111 98 - 111 mmol/L   CO2 26 22 - 32 mmol/L   Glucose, Bld 114 (H) 70 - 99 mg/dL    Comment: Glucose reference range applies only to samples taken after fasting for at least 8 hours.   BUN 10 6 - 20 mg/dL   Creatinine, Ser 0.88 0.61 - 1.24 mg/dL   Calcium 8.9 8.9 - 10.3 mg/dL   GFR, Estimated >60 >60 mL/min    Comment: (NOTE) Calculated using the CKD-EPI Creatinine Equation (2021)    Anion gap 2 (L) 5 - 15    Comment: Performed at [facility] [address], [place], [place] [address]    Recent Results (from the past 240 hour(s))  Resp Panel by RT-PCR (Flu A&B, Covid) Nasopharyngeal Swab     Status: None   Collection Time: 09/08/20 11:36 AM   Specimen: Nasopharyngeal Swab; Nasopharyngeal(NP) swabs in vial transport medium  Result Value Ref Range Status   SARS Coronavirus 2 by RT PCR NEGATIVE NEGATIVE Final    Comment: (NOTE) SARS-CoV-2 target nucleic acids are NOT DETECTED.  The SARS-CoV-2 RNA is generally detectable in upper respiratory specimens during the acute phase of infection. The lowest concentration of SARS-CoV-2 viral copies this assay can detect is 138 copies/mL. A negative result does not preclude SARS-Cov-2 infection and should not be used as the sole basis for treatment or other patient management decisions. A negative result may occur with  improper specimen collection/handling, submission of specimen other than nasopharyngeal swab,  presence of viral mutation(s) within the areas targeted by this assay, and inadequate number of viral copies(<138 copies/mL). A negative result must be combined with clinical observations, patient history, and epidemiological information. The expected result is Negative.  Fact Sheet for Patients:  EntrepreneurPulse.com.au  Fact Sheet for Healthcare Providers:  IncredibleEmployment.be  This test is no t yet approved or cleared by the [place] FDA and  has been authorized for detection and/or diagnosis of SARS-CoV-2 by FDA under an Emergency Use Authorization (EUA). This EUA will remain  in effect (meaning this test can be used) for the duration of the COVID-19 declaration under Section 564(b)(1) of the Act, 21 U.S.C.section 360bbb-3(b)(1), unless the authorization is terminated  or revoked sooner.       Influenza A by PCR NEGATIVE NEGATIVE Final   Influenza B by PCR NEGATIVE NEGATIVE Final    Comment: (NOTE) The Xpert Xpress SARS-CoV-2/FLU/RSV plus assay is intended as an aid in the diagnosis of influenza from Nasopharyngeal swab specimens and should not be used as a sole basis for treatment. Nasal washings and aspirates are unacceptable for Xpert Xpress SARS-CoV-2/FLU/RSV testing.  Fact Sheet for Patients: EntrepreneurPulse.com.au  Fact Sheet for Healthcare Providers: IncredibleEmployment.be  This test is not yet approved or cleared by the [place] FDA and has been authorized for detection and/or diagnosis of SARS-CoV-2 by FDA under an Emergency Use Authorization (EUA). This EUA will remain in effect (meaning this test can be used) for the duration of the COVID-19 declaration under Section 564(b)(1) of the Act, 21 U.S.C. section 360bbb-3(b)(1), unless the authorization is terminated or revoked.  Performed at [facility], [place] [address], [place], [place] [address]   Blood Culture  (routine x 2)     Status: None (Preliminary result)   Collection Time: 09/08/20 11:36 AM   Specimen: BLOOD RIGHT HAND  Result Value Ref Range Status   Specimen Description BLOOD RIGHT HAND  Final   Special Requests   Final    BOTTLES DRAWN AEROBIC AND ANAEROBIC Blood Culture adequate volume   Culture   Final    NO GROWTH 4 DAYS Performed at [facility], [place] [address] [place], [place] [address]    Report Status PENDING  Incomplete  Blood Culture (routine x 2)     Status: None (Preliminary result)   Collection Time: 09/08/20 11:41 AM   Specimen: BLOOD RIGHT FOREARM  Result Value Ref Range Status   Specimen Description BLOOD RIGHT FOREARM  Final   Special Requests   Final    BOTTLES DRAWN AEROBIC AND ANAEROBIC Blood Culture adequate volume   Culture   Final    NO GROWTH 4 DAYS Performed at [facility], [place] [address], [place], [place] [address]    Report Status PENDING  Incomplete  Urine culture     Status: None   Collection Time: 09/08/20  3:42 PM   Specimen: In/Out Cath Urine  Result Value Ref Range Status   Specimen Description IN/OUT CATH URINE  Final   Special Requests NONE  Final   Culture   Final    NO GROWTH Performed at [facility], [place] [address], [place], [place] [address]    Report Status 09/09/2020 FINAL  Final  CSF culture w Gram Stain     Status: None (Preliminary result)   Collection Time: 09/10/20  2:18 PM   Specimen: PATH Cytology CSF; Cerebrospinal Fluid  Result Value Ref Range Status   Specimen Description CSF  Final   Special Requests NONE  Final   Gram Stain   Final    WBC PRESENT,BOTH PMN AND MONONUCLEAR NO ORGANISMS SEEN CYTOSPIN SMEAR    Culture   Final    NO GROWTH 2 DAYS Performed at [facility], [place] [address], [place], [place] [address]    Report Status PENDING  Incomplete    Lipid Panel No results for input(s): CHOL, TRIG, HDL, CHOLHDL, VLDL, LDLCALC in the last 72 hours.  Studies/Results: CT ANGIO HEAD  W OR WO CONTRAST  Result Date: 09/11/2020 CLINICAL DATA:  Subdural hematoma EXAM: CT ANGIOGRAPHY HEAD TECHNIQUE: Multidetector CT imaging of the head was performed using the standard protocol during bolus administration of intravenous contrast. Multiplanar CT image reconstructions and MIPs were obtained to evaluate the vascular anatomy. CONTRAST:  37mL OMNIPAQUE IOHEXOL 350 MG/ML SOLN COMPARISON:  Head CT 09/08/2020 FINDINGS: CT HEAD Brain: There is no mass, hemorrhage or extra-axial collection. The size and configuration of the ventricles and extra-axial CSF spaces are normal. The brain parenchyma is normal, without acute or chronic infarction. Vascular: No abnormal hyperdensity of the major intracranial arteries or dural venous sinuses. No intracranial atherosclerosis. Skull: Low-density lesion of the right supraorbital scalp measures 2.3 cm. Sinuses/Orbits: No fluid levels or advanced mucosal thickening of the visualized paranasal sinuses. No mastoid or middle ear effusion. The orbits are normal. CTA HEAD POSTERIOR CIRCULATION: --Vertebral arteries: Normal --Inferior cerebellar arteries: Normal. --Basilar artery: Normal. --Superior cerebellar arteries: Normal. --Posterior cerebral arteries: Normal. ANTERIOR CIRCULATION: --Intracranial internal carotid arteries: Normal. --Anterior cerebral arteries (ACA): Normal. --Middle cerebral arteries (MCA): Normal. ANATOMIC VARIANTS: None Review of the MIP images confirms the above findings. IMPRESSION: 1. No intracranial arterial occlusion or high-grade stenosis. No aneurysm or vascular lesion. 2. No intracranial hemorrhage. Electronically Signed   By: Ulyses Jarred M.D.   On: 09/11/2020 21:04   MR BRAIN W WO CONTRAST  Result Date: 09/11/2020 CLINICAL DATA:  Abnormal lumbar puncture EXAM: MRI HEAD WITHOUT AND WITH CONTRAST TECHNIQUE: Multiplanar, multiecho pulse sequences of the brain and surrounding structures were obtained without and with intravenous contrast.  CONTRAST:  59mL GADAVIST GADOBUTROL 1 MMOL/ML IV SOLN COMPARISON:  None. FINDINGS: Brain: No acute infarct, mass effect or extra-axial collection. No acute or chronic hemorrhage. There is multifocal hyperintense T2-weighted signal within the white matter. Parenchymal volume and CSF spaces are normal. The midline structures are normal. Vascular: Major flow voids are preserved. Skull and upper cervical spine: Right scalp lesion shows diffusion restriction consistent with epidermoid. Normal calvarium. Sinuses/Orbits:No paranasal sinus fluid levels or advanced mucosal thickening. No mastoid or middle ear effusion. Normal orbits. IMPRESSION: 1. No acute intracranial abnormality. No acute or chronic hemorrhage. 2. Multifocal hyperintense T2-weighted signal within the white matter, nonspecific but may be seen in the setting of chronic microvascular ischemia. 3. Right scalp lesion consistent with epidermoid. Electronically Signed   By: Ulyses Jarred M.D.   On: 09/11/2020 22:52   DG FL GUIDED LUMBAR PUNCTURE  Result Date: 09/10/2020 CLINICAL DATA:  Seizures.  Sepsis. EXAM: DIAGNOSTIC LUMBAR PUNCTURE UNDER FLUOROSCOPIC GUIDANCE COMPARISON:  Lumbar spine CT from 04/23/2019 was reviewed. FLUOROSCOPY TIME:  Fluoroscopy Time:  0 minutes and 48 seconds. Radiation Exposure Index (if provided by the fluoroscopic device): 55.4 mGy Number of Acquired Spot Images: PROCEDURE: Written informed consent was obtained from the patient prior to the procedure, including potential complications of bleeding, infection, inability to obtain CSF, and headache. Patient voiced understanding and he and his daughter were provided opportunity to ask questions. Prior to starting the procedure, a formal time-out was performed. Using fluoroscopic guidance, appropriate skin sites over the L5-S1 level (patient noted at transitional anatomy) and L2-3 level were identified as thecal sac was markedly narrow at other levels. With the patient prone,  the lower  back was prepped and draped in the usual sterile fashion. 1% Lidocaine was used for local anesthesia. Lumbar puncture was performed at the L5-S1 level using a 20 gauge needle with return of yellow tinged CSF with an opening pressure of 23-24 cm water. CSF space was accessed on the first pass with brisk return. After approximately 5-6 cc of CSF was obtained, the spinal needle and tubing filled with blood and no further CSF could be obtained despite multiple repeated attempts at needle repositioning. This was discussed with the patient and as he was comfortable, he agreed to allow an additional punctur at the L2-3 level in an attempt to obtain additional CSF for laboratory analysis. Multiple attempts at the L2-3 level did not obtain additional CSF. IMPRESSION: Successful fluoro guided lumbar puncture at the L5-S1 level although only about 5-6 cc of CSF was obtained. I discussed the limited volume obtained with Dr. Cruzita Lederer immediately after the procedure was completed. Patient tolerated the procedure well without evidence for immediate complication. Electronically Signed   By: Misty Stanley M.D.   On: 09/10/2020 15:27    Medications:  Scheduled: . amLODipine  10 mg Oral Daily  . gabapentin  600 mg Oral BID  . lamoTRIgine  25 mg Oral Daily  . levETIRAcetam  1,500 mg Oral BID  . nicotine  21 mg Transdermal Daily  . QUEtiapine  100 mg Oral QHS  . sertraline  100 mg Oral QPC breakfast   Continuous: . acyclovir 900 mg (09/12/20 0527)  . ceFEPime (MAXIPIME) IV 2 g (09/12/20 0408)  . vancomycin 1,000 mg (09/12/20 0201)      Assessment: A 60 year old male with a history of epilepsy, bipolar d/o, PTSD and HTN who presents with 3 breakthrough seizures in the setting of possible sepsis. No further sz after starting empiric abx coverage.  1. No neurological events and is back to baseline  2. MRI Brain W/WO: Multifocal hyperintense T2-weighted signal within the white matter, nonspecific but may be seen in the  setting of chronic microvascular ischemia. Right scalp lesion consistent with epidermoid. 3. CTA head: No intracranial arterial occlusion or high-grade stenosis. No aneurysm or vascular lesion. No intracranial hemorrhage. 4. EEG x 2: Normal. 5. CSF abnormal with elevated RBC, xanthochromia, markedly elevated protein and mildly elevated WBC.  6. CSF cryptococcal antigen negative. CSF cultures NGTD, HSV pending, Varicella pending 7. Although CSF was xanthochromic, there is no evidence for subarachnoid hemorrhage on MRI brain or CT head, and no aneurysm is seen on CTA head. There may be a spinal hemorrhage given the above negative findings and this will need to be further evaluated with MRI of the cervical, thoracic and lumbar spine.   Recommendations: - Continue empiric coverage for now, however bacterial CNS infection less likely given only 8 WBC in CSF. May need ID to make final decision on whether to discontinue cefepime and vancomycin.  - Would continue acyclovir while awaiting HSV 1/2 PCR on CSF (ordered as add-on to already-collected CSF sample). - Continue Keppra at 1500 mg IV q 12 hrs.  -Starting lamotrigine titration @25  mg po qAM (25 mg qd x 1 week, then 25 mg BID x 1 week, then 25 mg QAM and 50 mg QHS x 1 week, then 50 mg BID x 1 week. Following this initial titration schedule, will need to return to his primary Neurologist for continued instructions. ) - Follow up on CSF cultures - Obtain MRI of the cervical, thoracic and lumbar spine (ordered).  LOS: 4 days  Beulah Gandy, ACNPC-AG 09/12/2020  10:53 AM

## 2020-09-12 NOTE — Progress Notes (Signed)
Restarting lamotrigine with standard titration schedule. First week will be at 25 mg po qAM. Continuing Keppra at 1500 mg BID, but has been switched to PO. Will need outpatient Neurology follow up.   Electronically signed: Dr. Kerney Elbe

## 2020-09-12 NOTE — Plan of Care (Signed)

## 2020-09-12 NOTE — Progress Notes (Signed)
Pt's BP 190/102 w/o coverage. Dr. Cyd Silence made aware.

## 2020-09-12 NOTE — Progress Notes (Signed)
PROGRESS NOTE  Keith Dixon [id number] DOB: 1961/02/27 DOA: 09/08/2020 PCP: Verline Lema, MD   LOS: 4 days   Brief Narrative / Interim history: 60 year old male with history of seizure disorders, PTSD, hypertension, bipolar who came into the hospital on 4/30 with seizures as well as encephalopathy.  Per reports, patient had a fever, was lethargic and had had a few seizures at home.  He reports compliance with seizure medications however there is some documentation in the past about nonadherence.  He has had a seizure in the past when he had a urinary tract infection.  EMS witnessed 3 seizures and he was given IM Versed on route.  Initial work-up in the ED was negative for an apparent source he was placed on broad-spectrum antibiotics.  An LP was attempted in the ED and failed.  He was placed on broad-spectrum antibiotics and admitted to the hospital.  Subjective / 24h Interval events: Apart from chronic pain from waist down, denies complaints.  Patient's girlfriend was on his speaker phone and reported that he has been having increased frequency of seizures over the last 4 months but they are not severe except the ones that brought him to the hospital.  Also reported urinary incontinence.  Patient follows with the [facility].  Assessment & Plan: Principal Problem Seizures-multiple seizures at home in the setting of fever.  He has been placed on broad-spectrum antibiotics, fever is now controlled and he has remained seizure-free since admission -Mental status back to baseline.   -Communicated with neurology.  Their follow-up appreciated.  As verified with pharmacy: Med history from [facility] records reported patient on lamotrigine 200 mg a.m. and 300 mg p.m.  Neurology have initiated lamotrigine 25 mg daily for the first week with standard titration schedule (25 mg qd x 1 week, then 25 mg BID x 1 week, then 25 mg QAM and 50 mg QHS x 1 week, then 50 mg BID x 1 week.) and continuing  Keppra 1500 mg twice daily.  Will need outpatient neurology follow-up.  Active Problems SIRS -febrile on admission, tachycardic.  Initial work-up relatively unremarkable, chest x-ray without acute findings, he is COVID-negative, urinalysis with few bacteria and trace leukocytes overall not that impressive.  Cultures are negative to date.  Underwent an LP on 5/2 with a WBC, 455 RBC normal glucose, negative gram stain.  HSV and varicella-zoster PCR still pending.  CSF bacterial culture negative to date after 2 days. -Per neurology follow-up yesterday, "May need ID to make final decision on whether to discontinue cefepime and vancomycin".  They also felt that bacterial CNS infection was less likely.  I communicated with infectious disease MD on-call on 5/4 who reviewed his chart and indicated that this is less likely to be bacterial meningitis, and as per recommendations discontinue IV antibiotics, feels that this is a viral process.  He also indicated that since MRI is reassuring and he turned around rather quickly for it to suggest HSV process but reasonable to keep acyclovir until HSV PCR is negative.  Acute metabolic encephalopathy -in the setting of fever, seizures, postictal state.  CT head was negative.  Resolved and back to his baseline.  Tobacco use-nicotine patch.  Cessation counseled.  Essential hypertension-continue Norvasc.  Uncontrolled.  Added as needed hydralazine.  PTSD, bipolar-resume home medications  Thrombocytopenia: Unclear etiology but appears chronic and intermittent.  May be due to [facility] picture versus med related.  Follow CBC in a.m.  Anemia, suspect chronic disease May be stable compared  to prior chart review.  Follow CBC in a.m.  Hypokalemia/hypomagnesemia Has been repleted but no repeat labs.  Follow labs in a.m.  Scheduled Meds: . amLODipine  10 mg Oral Daily  . gabapentin  600 mg Oral BID  . lamoTRIgine  25 mg Oral Daily  . levETIRAcetam  1,500 mg Oral BID   . nicotine  21 mg Transdermal Daily  . QUEtiapine  100 mg Oral QHS  . sertraline  100 mg Oral QPC breakfast   Continuous Infusions: . acyclovir 900 mg (09/12/20 0527)  . ceFEPime (MAXIPIME) IV 2 g (09/12/20 1307)  . vancomycin 1,000 mg (09/12/20 0201)   PRN Meds:.acetaminophen **OR** acetaminophen, albuterol, baclofen, hydrALAZINE, hydrOXYzine, LORazepam, ondansetron **OR** ondansetron (ZOFRAN) IV  Diet Orders (From admission, onward)    Start     Ordered   09/08/20 2225  Diet Heart Room service appropriate? Yes; Fluid consistency: Thin  Diet effective now       Question Answer Comment  Room service appropriate? Yes   Fluid consistency: Thin      09/08/20 2224          DVT prophylaxis: SCDs Start: 09/08/20 1542     Code Status: Full Code  Family Communication: Discussed in detail with patient's girlfriend on patient's speaker phone at bedside.  Updated care and answered questions.  Status is: Inpatient  Remains inpatient appropriate because:Inpatient level of care appropriate due to severity of illness   Dispo: The patient is from: Home              Anticipated d/c is to: Home              Patient currently is not medically stable to d/c.   Difficult to place patient No  Level of care: Progressive  Consultants:  Neurology  Procedures:  None  Microbiology  Blood cultures-negative to date Urine cultures-negative  Antimicrobials: Vancomycin 4/30 >> 5/4 Cefepime 4/30 >> 5/4 Acyclovir 4/30 >>   Objective: Vitals:   09/12/20 0031 09/12/20 0303 09/12/20 0733 09/12/20 1147  BP: (!) 190/102 (!) 170/95 (!) 183/98 (!) 190/89  Pulse: 76 71 73 76  Resp: (!) 21 19 17 20   Temp: 97.9 F (36.6 C) 97.8 F (36.6 C) 97.8 F (36.6 C)   TempSrc: Oral Oral Oral   SpO2: 100% 100% 100% 95%  Weight:      Height:        Intake/Output Summary (Last 24 hours) at 09/12/2020 1308 Last data filed at 09/12/2020 1740 Gross per 24 hour  Intake 1604.57 ml  Output 2150 ml   Net -545.43 ml   Filed Weights   09/08/20 1133  Weight: 113.8 kg    Examination:  General exam: Pleasant young male, moderately built and obese lying comfortably propped up in bed without distress. Respiratory system: Clear to auscultation. Respiratory effort normal. Cardiovascular system: S1 & S2 heard, RRR. No JVD, murmurs, rubs, gallops or clicks. No pedal edema.  Telemetry personally reviewed: Sinus rhythm. Gastrointestinal system: Abdomen is nondistended, soft and nontender. No organomegaly or masses felt. Normal bowel sounds heard. Central nervous system: Alert and oriented. No focal neurological deficits. Extremities: Symmetric 5 x 5 power. Skin: No rashes, lesions or ulcers Psychiatry: Judgement and insight appear normal. Mood & affect appropriate.     Data Reviewed: I have independently reviewed following labs and imaging studies   CBC: Recent Labs  Lab 09/08/20 1136 09/08/20 1208 09/09/20 0209 09/10/20 0330  WBC 8.3  --  10.2 9.9  NEUTROABS 7.8*  --   --   --  HGB 13.5 14.3 12.3* 11.7*  HCT 41.6 42.0 36.9* 35.2*  MCV 83.0  --  81.3 82.8  PLT 176  --  102* 951*   Basic Metabolic Panel: Recent Labs  Lab 09/08/20 1136 09/08/20 1208 09/08/20 2120 09/09/20 0209 09/10/20 0330 09/11/20 0445 09/12/20 0353  NA 134* 138  --  135 139 138 139  K 3.9 3.0*  --  3.2* 3.3* 4.2 3.8  CL 102 101  --  103 108 109 111  CO2 20*  --   --  20* 23 19* 26  GLUCOSE 98 98  --  84 108* 121* 114*  BUN 17 16  --  17 17 12 10   CREATININE 1.23 1.00  --  1.12 1.27* 1.00 0.88  CALCIUM 9.0  --   --  8.7* 8.8* 8.8* 8.9  MG  --   --  1.7 1.5*  --   --   --   PHOS  --   --   --  1.6*  --   --   --    Liver Function Tests: Recent Labs  Lab 09/08/20 1136 09/10/20 0330  AST 50* 25  ALT 28 21  ALKPHOS 74 57  BILITOT 2.3* 0.8  PROT 6.7 5.6*  ALBUMIN 4.0 2.9*   Coagulation Profile: Recent Labs  Lab 09/08/20 1136  INR 1.0   HbA1C: No results for input(s): HGBA1C in the last  72 hours. CBG: Recent Labs  Lab 09/08/20 1129  GLUCAP 121*    Recent Results (from the past 240 hour(s))  Resp Panel by RT-PCR (Flu A&B, Covid) Nasopharyngeal Swab     Status: None   Collection Time: 09/08/20 11:36 AM   Specimen: Nasopharyngeal Swab; Nasopharyngeal(NP) swabs in vial transport medium  Result Value Ref Range Status   SARS Coronavirus 2 by RT PCR NEGATIVE NEGATIVE Final    Comment: (NOTE) SARS-CoV-2 target nucleic acids are NOT DETECTED.  The SARS-CoV-2 RNA is generally detectable in upper respiratory specimens during the acute phase of infection. The lowest concentration of SARS-CoV-2 viral copies this assay can detect is 138 copies/mL. A negative result does not preclude SARS-Cov-2 infection and should not be used as the sole basis for treatment or other patient management decisions. A negative result may occur with  improper specimen collection/handling, submission of specimen other than nasopharyngeal swab, presence of viral mutation(s) within the areas targeted by this assay, and inadequate number of viral copies(<138 copies/mL). A negative result must be combined with clinical observations, patient history, and epidemiological information. The expected result is Negative.  Fact Sheet for Patients:  EntrepreneurPulse.com.au  Fact Sheet for Healthcare Providers:  IncredibleEmployment.be  This test is no t yet approved or cleared by the [place] FDA and  has been authorized for detection and/or diagnosis of SARS-CoV-2 by FDA under an Emergency Use Authorization (EUA). This EUA will remain  in effect (meaning this test can be used) for the duration of the COVID-19 declaration under Section 564(b)(1) of the Act, 21 U.S.C.section 360bbb-3(b)(1), unless the authorization is terminated  or revoked sooner.       Influenza A by PCR NEGATIVE NEGATIVE Final   Influenza B by PCR NEGATIVE NEGATIVE Final    Comment:  (NOTE) The Xpert Xpress SARS-CoV-2/FLU/RSV plus assay is intended as an aid in the diagnosis of influenza from Nasopharyngeal swab specimens and should not be used as a sole basis for treatment. Nasal washings and aspirates are unacceptable for Xpert Xpress SARS-CoV-2/FLU/RSV testing.  Fact Sheet for Patients:  EntrepreneurPulse.com.au  Fact Sheet for Healthcare Providers: IncredibleEmployment.be  This test is not yet approved or cleared by the [place] FDA and has been authorized for detection and/or diagnosis of SARS-CoV-2 by FDA under an Emergency Use Authorization (EUA). This EUA will remain in effect (meaning this test can be used) for the duration of the COVID-19 declaration under Section 564(b)(1) of the Act, 21 U.S.C. section 360bbb-3(b)(1), unless the authorization is terminated or revoked.  Performed at [facility], [place] [address] [place], [place] [address]   Blood Culture (routine x 2)     Status: None (Preliminary result)   Collection Time: 09/08/20 11:36 AM   Specimen: BLOOD RIGHT HAND  Result Value Ref Range Status   Specimen Description BLOOD RIGHT HAND  Final   Special Requests   Final    BOTTLES DRAWN AEROBIC AND ANAEROBIC Blood Culture adequate volume   Culture   Final    NO GROWTH 4 DAYS Performed at [facility], [place] [address], [place], [place] [address]    Report Status PENDING  Incomplete  Blood Culture (routine x 2)     Status: None (Preliminary result)   Collection Time: 09/08/20 11:41 AM   Specimen: BLOOD RIGHT FOREARM  Result Value Ref Range Status   Specimen Description BLOOD RIGHT FOREARM  Final   Special Requests   Final    BOTTLES DRAWN AEROBIC AND ANAEROBIC Blood Culture adequate volume   Culture   Final    NO GROWTH 4 DAYS Performed at [facility], [place] [address] [place], [place] [address]    Report Status PENDING  Incomplete  Urine culture     Status: None   Collection Time:  09/08/20  3:42 PM   Specimen: In/Out Cath Urine  Result Value Ref Range Status   Specimen Description IN/OUT CATH URINE  Final   Special Requests NONE  Final   Culture   Final    NO GROWTH Performed at [facility], [place] [address], [place], [place] [address]    Report Status 09/09/2020 FINAL  Final  CSF culture w Gram Stain     Status: None (Preliminary result)   Collection Time: 09/10/20  2:18 PM   Specimen: PATH Cytology CSF; Cerebrospinal Fluid  Result Value Ref Range Status   Specimen Description CSF  Final   Special Requests NONE  Final   Gram Stain   Final    WBC PRESENT,BOTH PMN AND MONONUCLEAR NO ORGANISMS SEEN CYTOSPIN SMEAR    Culture   Final    NO GROWTH 2 DAYS Performed at [facility], [place] [address], [place],  [address]    Report Status PENDING  Incomplete     Radiology Studies: CT ANGIO HEAD W OR WO CONTRAST  Result Date: 09/11/2020 CLINICAL DATA:  Subdural hematoma EXAM: CT ANGIOGRAPHY HEAD TECHNIQUE: Multidetector CT imaging of the head was performed using the standard protocol during bolus administration of intravenous contrast. Multiplanar CT image reconstructions and MIPs were obtained to evaluate the vascular anatomy. CONTRAST:  17mL OMNIPAQUE IOHEXOL 350 MG/ML SOLN COMPARISON:  Head CT 09/08/2020 FINDINGS: CT HEAD Brain: There is no mass, hemorrhage or extra-axial collection. The size and configuration of the ventricles and extra-axial CSF spaces are normal. The brain parenchyma is normal, without acute or chronic infarction. Vascular: No abnormal hyperdensity of the major intracranial arteries or dural venous sinuses. No intracranial atherosclerosis. Skull: Low-density lesion of the right supraorbital scalp measures 2.3 cm. Sinuses/Orbits: No  fluid levels or advanced mucosal thickening of the visualized paranasal sinuses. No mastoid or middle ear effusion. The orbits are normal. CTA HEAD POSTERIOR CIRCULATION: --Vertebral arteries: Normal  --Inferior cerebellar arteries: Normal. --Basilar artery: Normal. --Superior cerebellar arteries: Normal. --Posterior cerebral arteries: Normal. ANTERIOR CIRCULATION: --Intracranial internal carotid arteries: Normal. --Anterior cerebral arteries (ACA): Normal. --Middle cerebral arteries (MCA): Normal. ANATOMIC VARIANTS: None Review of the MIP images confirms the above findings. IMPRESSION: 1. No intracranial arterial occlusion or high-grade stenosis. No aneurysm or vascular lesion. 2. No intracranial hemorrhage. Electronically Signed   By: Ulyses Jarred M.D.   On: 09/11/2020 21:04   MR BRAIN W WO CONTRAST  Result Date: 09/11/2020 CLINICAL DATA:  Abnormal lumbar puncture EXAM: MRI HEAD WITHOUT AND WITH CONTRAST TECHNIQUE: Multiplanar, multiecho pulse sequences of the brain and surrounding structures were obtained without and with intravenous contrast. CONTRAST:  64mL GADAVIST GADOBUTROL 1 MMOL/ML IV SOLN COMPARISON:  None. FINDINGS: Brain: No acute infarct, mass effect or extra-axial collection. No acute or chronic hemorrhage. There is multifocal hyperintense T2-weighted signal within the white matter. Parenchymal volume and CSF spaces are normal. The midline structures are normal. Vascular: Major flow voids are preserved. Skull and upper cervical spine: Right scalp lesion shows diffusion restriction consistent with epidermoid. Normal calvarium. Sinuses/Orbits:No paranasal sinus fluid levels or advanced mucosal thickening. No mastoid or middle ear effusion. Normal orbits. IMPRESSION: 1. No acute intracranial abnormality. No acute or chronic hemorrhage. 2. Multifocal hyperintense T2-weighted signal within the white matter, nonspecific but may be seen in the setting of chronic microvascular ischemia. 3. Right scalp lesion consistent with epidermoid. Electronically Signed   By: Ulyses Jarred M.D.   On: 09/11/2020 22:52    Vernell Leep, MD, [place], [facility]. [facility]  To contact the attending provider  between 7A-7P or the covering provider during after hours 7P-7A, please log into the web site [email or web address] and access using universal [place] password for that web site. If you do not have the password, please call the hospital operator.

## 2020-09-12 NOTE — Progress Notes (Signed)
OT Cancellation Note  Patient Details Name: Keith Dixon MRN: [id number] DOB: 1961/03/20   Cancelled Treatment:    Reason Eval/Treat Not Completed: Patient at procedure or test/ unavailable.  Pt with another provider.  Nilsa Nutting., OTR/L Acute Rehabilitation Services Pager [phone] Office [phone]   Lucille Passy M 09/12/2020, 5:14 PM

## 2020-09-13 ENCOUNTER — Inpatient Hospital Stay (HOSPITAL_COMMUNITY): Payer: No Typology Code available for payment source

## 2020-09-13 DIAGNOSIS — R569 Unspecified convulsions: Secondary | ICD-10-CM | POA: Diagnosis not present

## 2020-09-13 DIAGNOSIS — F4310 Post-traumatic stress disorder, unspecified: Secondary | ICD-10-CM

## 2020-09-13 DIAGNOSIS — I10 Essential (primary) hypertension: Secondary | ICD-10-CM | POA: Diagnosis not present

## 2020-09-13 LAB — BASIC METABOLIC PANEL
Anion gap: 8 (ref 5–15)
BUN: 10 mg/dL (ref 6–20)
CO2: 25 mmol/L (ref 22–32)
Calcium: 9.5 mg/dL (ref 8.9–10.3)
Chloride: 105 mmol/L (ref 98–111)
Creatinine, Ser: 0.87 mg/dL (ref 0.61–1.24)
GFR, Estimated: >60 mL/min (ref >60)
Glucose, Bld: 112 mg/dL — ABNORMAL HIGH (ref 70–99)
Potassium: 4.2 mmol/L (ref 3.5–5.1)
Sodium: 138 mmol/L (ref 135–145)

## 2020-09-13 LAB — CULTURE, BLOOD (ROUTINE X 2)
Culture: NO GROWTH
Culture: NO GROWTH
Special Requests: ADEQUATE
Special Requests: ADEQUATE

## 2020-09-13 LAB — CBC
HCT: 38.7 % — ABNORMAL LOW (ref 39.0–52.0)
Hemoglobin: 12.4 g/dL — ABNORMAL LOW (ref 13.0–17.0)
MCH: 26.6 pg (ref 26.0–34.0)
MCHC: 32 g/dL (ref 30.0–36.0)
MCV: 82.9 fL (ref 80.0–100.0)
Platelets: 190 10*3/uL (ref 150–400)
RBC: 4.67 MIL/uL (ref 4.22–5.81)
RDW: 15.9 % — ABNORMAL HIGH (ref 11.5–15.5)
WBC: 5.5 10*3/uL (ref 4.0–10.5)
nRBC: 0 % (ref 0.0–0.2)

## 2020-09-13 LAB — HSV 1/2 PCR, CSF
HSV-1 DNA: NEGATIVE
HSV-2 DNA: NEGATIVE

## 2020-09-13 MED ORDER — VITAMIN D3 25 MCG (1000 UNIT) PO TABS
2000.0000 [IU] | ORAL_TABLET | Freq: Every day | ORAL | Status: DC
  Administered 2020-09-13 – 2020-09-14 (×2): 2000 [IU] via ORAL
  Filled 2020-09-13 (×4): qty 2

## 2020-09-13 MED ORDER — HYDROCHLOROTHIAZIDE 12.5 MG PO CAPS
12.5000 mg | ORAL_CAPSULE | Freq: Every day | ORAL | Status: DC
  Administered 2020-09-13 – 2020-09-14 (×2): 12.5 mg via ORAL
  Filled 2020-09-13 (×2): qty 1

## 2020-09-13 MED ORDER — LISINOPRIL 20 MG PO TABS
20.0000 mg | ORAL_TABLET | Freq: Every day | ORAL | Status: DC
  Administered 2020-09-13 – 2020-09-14 (×2): 20 mg via ORAL
  Filled 2020-09-13 (×2): qty 1

## 2020-09-13 MED ORDER — LISINOPRIL-HYDROCHLOROTHIAZIDE 20-12.5 MG PO TABS: 1.0000 | ORAL_TABLET | Freq: Every day | ORAL | Status: DC

## 2020-09-13 NOTE — Progress Notes (Signed)
Subjective: No complaints today.   Objective: Current vital signs: BP (!) 174/97 (BP Location: Left Arm)   Pulse 86   Temp 98.8 F (37.1 C) (Oral)   Resp 20   Ht 6' (1.829 m) Comment: prior encounter  Wt 113.8 kg   SpO2 98%   BMI 34.03 kg/m  Vital signs in last 24 hours: Temp:  [98.6 F (37 C)-98.9 F (37.2 C)] 98.8 F (37.1 C) (05/05 0804) Pulse Rate:  [76-95] 86 (05/05 0804) Resp:  [19-21] 20 (05/05 0804) BP: (140-191)/(62-108) 174/97 (05/05 0804) SpO2:  [95 %-100 %] 98 % (05/05 0804)  Intake/Output from previous day: 05/04 0701 - 05/05 0700 In: -  Out: 1275 [Urine:1275] Intake/Output this shift: No intake/output data recorded. Nutritional status:  Diet Order            Diet Heart Room service appropriate? Yes; Fluid consistency: Thin  Diet effective now                HEENT: [place]/AT Lungs: Respirations unlabored  Neurologic Exam: Ment: Awake and alert. Fully oriented. Speech is fluent with intact comprehension. Pleasant and cooperative. Affect is cheerful but with some evidence for anxiety.  CN: Fixates and tracks normally. EOMI. Face symmetric. Phonation intact.  Motor: 5/5 x 4 Sensory: Intact to FT x 4 Cerebellar: No ataxia with FNF bilaterally  Lab Results: Results for orders placed or performed during the hospital encounter of 09/08/20 (from the past 48 hour(s))  Basic metabolic panel     Status: Abnormal   Collection Time: 09/12/20  3:53 AM  Result Value Ref Range   Sodium 139 135 - 145 mmol/L   Potassium 3.8 3.5 - 5.1 mmol/L   Chloride 111 98 - 111 mmol/L   CO2 26 22 - 32 mmol/L   Glucose, Bld 114 (H) 70 - 99 mg/dL    Comment: Glucose reference range applies only to samples taken after fasting for at least 8 hours.   BUN 10 6 - 20 mg/dL   Creatinine, Ser 1.65 0.61 - 1.24 mg/dL   Calcium 8.9 8.9 - 79.0 mg/dL   GFR, Estimated >38 >33 mL/min    Comment: (NOTE) Calculated using the CKD-EPI Creatinine Equation (2021)    Anion gap 2 (L) 5 - 15     Comment: Performed at [facility], [address] [place], [place] [address]  Magnesium     Status: None   Collection Time: 09/12/20  3:53 AM  Result Value Ref Range   Magnesium 1.8 1.7 - 2.4 mg/dL    Comment: Performed at [facility], [address], [place], [place] [address]  Phosphorus     Status: None   Collection Time: 09/12/20  3:53 AM  Result Value Ref Range   Phosphorus 3.6 2.5 - 4.6 mg/dL    Comment: Performed at [facility], [address] [place], [place] [address]  Basic metabolic panel     Status: Abnormal   Collection Time: 09/13/20  4:28 AM  Result Value Ref Range   Sodium 138 135 - 145 mmol/L   Potassium 4.2 3.5 - 5.1 mmol/L   Chloride 105 98 - 111 mmol/L   CO2 25 22 - 32 mmol/L   Glucose, Bld 112 (H) 70 - 99 mg/dL    Comment: Glucose reference range applies only to samples taken after fasting for at least 8 hours.   BUN 10 6 - 20 mg/dL   Creatinine, Ser 5.99 0.61 - 1.24 mg/dL  Calcium 9.5 8.9 - 10.3 mg/dL   GFR, Estimated >60 >60 mL/min    Comment: (NOTE) Calculated using the CKD-EPI Creatinine Equation (2021)    Anion gap 8 5 - 15    Comment: Performed at [facility], [place] [address], [place], [place] [address]  CBC     Status: Abnormal   Collection Time: 09/13/20  4:28 AM  Result Value Ref Range   WBC 5.5 4.0 - 10.5 K/uL   RBC 4.67 4.22 - 5.81 MIL/uL   Hemoglobin 12.4 (L) 13.0 - 17.0 g/dL   HCT 38.7 (L) 39.0 - 52.0 %   MCV 82.9 80.0 - 100.0 fL   MCH 26.6 26.0 - 34.0 pg   MCHC 32.0 30.0 - 36.0 g/dL   RDW 15.9 (H) 11.5 - 15.5 %   Platelets 190 150 - 400 K/uL   nRBC 0.0 0.0 - 0.2 %    Comment: Performed at [facility], [place] [address] [place], [place] [address]    Recent Results (from the past 240 hour(s))  Resp Panel by RT-PCR (Flu A&B, Covid) Nasopharyngeal Swab     Status: None   Collection Time: 09/08/20 11:36 AM   Specimen: Nasopharyngeal Swab; Nasopharyngeal(NP) swabs in vial transport medium  Result Value  Ref Range Status   SARS Coronavirus 2 by RT PCR NEGATIVE NEGATIVE Final    Comment: (NOTE) SARS-CoV-2 target nucleic acids are NOT DETECTED.  The SARS-CoV-2 RNA is generally detectable in upper respiratory specimens during the acute phase of infection. The lowest concentration of SARS-CoV-2 viral copies this assay can detect is 138 copies/mL. A negative result does not preclude SARS-Cov-2 infection and should not be used as the sole basis for treatment or other patient management decisions. A negative result may occur with  improper specimen collection/handling, submission of specimen other than nasopharyngeal swab, presence of viral mutation(s) within the areas targeted by this assay, and inadequate number of viral copies(<138 copies/mL). A negative result must be combined with clinical observations, patient history, and epidemiological information. The expected result is Negative.  Fact Sheet for Patients:  EntrepreneurPulse.com.au  Fact Sheet for Healthcare Providers:  IncredibleEmployment.be  This test is no t yet approved or cleared by the [place] FDA and  has been authorized for detection and/or diagnosis of SARS-CoV-2 by FDA under an Emergency Use Authorization (EUA). This EUA will remain  in effect (meaning this test can be used) for the duration of the COVID-19 declaration under Section 564(b)(1) of the Act, 21 U.S.C.section 360bbb-3(b)(1), unless the authorization is terminated  or revoked sooner.       Influenza A by PCR NEGATIVE NEGATIVE Final   Influenza B by PCR NEGATIVE NEGATIVE Final    Comment: (NOTE) The Xpert Xpress SARS-CoV-2/FLU/RSV plus assay is intended as an aid in the diagnosis of influenza from Nasopharyngeal swab specimens and should not be used as a sole basis for treatment. Nasal washings and aspirates are unacceptable for Xpert Xpress SARS-CoV-2/FLU/RSV testing.  Fact Sheet for  Patients: EntrepreneurPulse.com.au  Fact Sheet for Healthcare Providers: IncredibleEmployment.be  This test is not yet approved or cleared by the [place] FDA and has been authorized for detection and/or diagnosis of SARS-CoV-2 by FDA under an Emergency Use Authorization (EUA). This EUA will remain in effect (meaning this test can be used) for the duration of the COVID-19 declaration under Section 564(b)(1) of the Act, 21 U.S.C. section 360bbb-3(b)(1), unless the authorization is terminated or revoked.  Performed at [facility],  [address] [place], [place] [address]   Blood Culture (routine x 2)     Status: None   Collection Time: 09/08/20 11:36 AM   Specimen: BLOOD RIGHT HAND  Result Value Ref Range Status   Specimen Description BLOOD RIGHT HAND  Final   Special Requests   Final    BOTTLES DRAWN AEROBIC AND ANAEROBIC Blood Culture adequate volume   Culture   Final    NO GROWTH 5 DAYS Performed at [facility], [place] [address], [place], [place] [address]    Report Status 09/13/2020 FINAL  Final  Blood Culture (routine x 2)     Status: None   Collection Time: 09/08/20 11:41 AM   Specimen: BLOOD RIGHT FOREARM  Result Value Ref Range Status   Specimen Description BLOOD RIGHT FOREARM  Final   Special Requests   Final    BOTTLES DRAWN AEROBIC AND ANAEROBIC Blood Culture adequate volume   Culture   Final    NO GROWTH 5 DAYS Performed at [facility], [place] [address] [place], [place] [address]    Report Status 09/13/2020 FINAL  Final  Urine culture     Status: None   Collection Time: 09/08/20  3:42 PM   Specimen: In/Out Cath Urine  Result Value Ref Range Status   Specimen Description IN/OUT CATH URINE  Final   Special Requests NONE  Final   Culture   Final    NO GROWTH Performed at [facility], [place] [address], [place], [place] [address]    Report Status 09/09/2020 FINAL  Final  CSF culture w Gram Stain      Status: None (Preliminary result)   Collection Time: 09/10/20  2:18 PM   Specimen: PATH Cytology CSF; Cerebrospinal Fluid  Result Value Ref Range Status   Specimen Description CSF  Final   Special Requests NONE  Final   Gram Stain   Final    WBC PRESENT,BOTH PMN AND MONONUCLEAR NO ORGANISMS SEEN CYTOSPIN SMEAR    Culture   Final    NO GROWTH 3 DAYS Performed at [facility], [place] [address] [place], [place] [address]    Report Status PENDING  Incomplete    Lipid Panel No results for input(s): CHOL, TRIG, HDL, CHOLHDL, VLDL, LDLCALC in the last 72 hours.  Studies/Results: CT ANGIO HEAD W OR WO CONTRAST  Result Date: 09/11/2020 CLINICAL DATA:  Subdural hematoma EXAM: CT ANGIOGRAPHY HEAD TECHNIQUE: Multidetector CT imaging of the head was performed using the standard protocol during bolus administration of intravenous contrast. Multiplanar CT image reconstructions and MIPs were obtained to evaluate the vascular anatomy. CONTRAST:  52mL OMNIPAQUE IOHEXOL 350 MG/ML SOLN COMPARISON:  Head CT 09/08/2020 FINDINGS: CT HEAD Brain: There is no mass, hemorrhage or extra-axial collection. The size and configuration of the ventricles and extra-axial CSF spaces are normal. The brain parenchyma is normal, without acute or chronic infarction. Vascular: No abnormal hyperdensity of the major intracranial arteries or dural venous sinuses. No intracranial atherosclerosis. Skull: Low-density lesion of the right supraorbital scalp measures 2.3 cm. Sinuses/Orbits: No fluid levels or advanced mucosal thickening of the visualized paranasal sinuses. No mastoid or middle ear effusion. The orbits are normal. CTA HEAD POSTERIOR CIRCULATION: --Vertebral arteries: Normal --Inferior cerebellar arteries: Normal. --Basilar artery: Normal. --Superior cerebellar arteries: Normal. --Posterior cerebral arteries: Normal. ANTERIOR CIRCULATION: --Intracranial internal carotid arteries: Normal. --Anterior cerebral arteries  (ACA): Normal. --Middle cerebral arteries (MCA): Normal. ANATOMIC VARIANTS: None Review of the MIP images  confirms the above findings. IMPRESSION: 1. No intracranial arterial occlusion or high-grade stenosis. No aneurysm or vascular lesion. 2. No intracranial hemorrhage. Electronically Signed   By: Ulyses Jarred M.D.   On: 09/11/2020 21:04   MR BRAIN W WO CONTRAST  Result Date: 09/11/2020 CLINICAL DATA:  Abnormal lumbar puncture EXAM: MRI HEAD WITHOUT AND WITH CONTRAST TECHNIQUE: Multiplanar, multiecho pulse sequences of the brain and surrounding structures were obtained without and with intravenous contrast. CONTRAST:  64mL GADAVIST GADOBUTROL 1 MMOL/ML IV SOLN COMPARISON:  None. FINDINGS: Brain: No acute infarct, mass effect or extra-axial collection. No acute or chronic hemorrhage. There is multifocal hyperintense T2-weighted signal within the white matter. Parenchymal volume and CSF spaces are normal. The midline structures are normal. Vascular: Major flow voids are preserved. Skull and upper cervical spine: Right scalp lesion shows diffusion restriction consistent with epidermoid. Normal calvarium. Sinuses/Orbits:No paranasal sinus fluid levels or advanced mucosal thickening. No mastoid or middle ear effusion. Normal orbits. IMPRESSION: 1. No acute intracranial abnormality. No acute or chronic hemorrhage. 2. Multifocal hyperintense T2-weighted signal within the white matter, nonspecific but may be seen in the setting of chronic microvascular ischemia. 3. Right scalp lesion consistent with epidermoid. Electronically Signed   By: Ulyses Jarred M.D.   On: 09/11/2020 22:52   MR CERVICAL SPINE WO CONTRAST  Result Date: 09/13/2020 CLINICAL DATA:  Myelopathy, acute or progressive EXAM: MRI CERVICAL, THORACIC AND LUMBAR SPINE WITHOUT CONTRAST TECHNIQUE: Multiplanar and multiecho pulse sequences of the cervical spine, to include the craniocervical junction and cervicothoracic junction, and thoracic and lumbar  spine, were obtained without intravenous contrast. COMPARISON:  None. FINDINGS: MRI CERVICAL SPINE FINDINGS Alignment: Degenerative reversal of cervical lordosis with C3-4 and C4-5 anterolisthesis. C3-4 anterolisthesis measures 3 mm. Vertebrae: No fracture, evidence of discitis, or bone lesion. Cord: Normal signal and morphology. Posterior Fossa, vertebral arteries, paraspinal tissues: Negative. Disc levels: C2-3: Mild facet spurring.  Small central disc protrusion. C3-4: Disc narrowing and endplate degeneration with bulging and ridging eccentric to the left there is also greater facet osteoarthritis and spurring. Spinal stenosis with mild ventral cord flattening. Advanced left foraminal impingement C4-5: Disc narrowing and mild bulging. Asymmetric left uncovertebral ridging and left facet spurring. Advanced left foraminal impingement C5-6: Disc narrowing and bulging with endplate and uncovertebral ridging. No cord compression. Advanced biforaminal impingement C6-7: Mild leftward disc bulging and facet spurring. Mild left foraminal stenosis based on gradient images. C7-T1:Unremarkable. MRI THORACIC SPINE FINDINGS Alignment:  Negative for listhesis. Vertebrae: No fracture, evidence of discitis, or bone lesion. Cord:  Normal signal when allowing for levels of motion artifact Paraspinal and other soft tissues: No perispinal mass or inflammation Disc levels: Generalized disc space narrowing and bulging with small disc protrusions at essentially every thoracic level, the largest being at T5-6. Facet spurring most notable on the left inferiorly with spurs causing left foraminal impingement to a high-grade at T9-10 and T11-12. MRI LUMBAR SPINE FINDINGS Segmentation: When numbered from above there is a transitional S1 vertebra. Alignment:  Mild levoscoliosis Vertebrae: Edematous signal about the disc spaces of L2-3 and L3-4. Facet marrow edema asymmetric to the right at L2-3. Conus medullaris and cauda equina: Conus extends  to the tip terminates at L1-2 level. No conus edema is seen. There is cauda equina redundancy due to the degree of severe spinal stenosis. Paraspinal and other soft tissues: Partial coverage of the bladder shows distension with trabeculated wall thickening. Disc levels: L1-L2: Disc narrowing and bulging with endplate ridging eccentric to the right. Asymmetric  right facet spurring. Moderate to advanced right foraminal impingement. Mild spinal stenosis mainly due to epidural fat L2-L3: Right-sided gas and fluid within a large fissure with discogenic marrow edema, overall degenerative appearing although there is some edema signal seen in the right psoas. A large herniation is seen extending superiorly and leftward with severe spinal stenosis. Facet spurring on the right more than left with advanced right foraminal impingement L3-L4: Disc collapse with gas and fluid in a large fissure. The annulus is circumferentialyy of bulging with buttressing osteophytes. Facet spurring on the right more than left. Severe spinal and right foraminal stenosis. Moderate left foraminal impingement L4-L5: Disc narrowing and bulging with gas and fluid containing fissure eccentric to the left. Degenerative facet spurring on the left more than right with severe left foraminal impingement and advanced spinal stenosis L5-S1:Disc narrowing and bulging with annular fissures. Degenerative facet spurring. Patent spinal canal. Severe left and moderate right foraminal impingement. IMPRESSION: Lumbar spine: 1. 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 2. Severe degenerative spinal stenosis at L2-3 and L3-4. L4-5 high-grade spinal stenosis. At L2-3 the stenosis is associated with a large superiorly migrating disc extrusion. 3. Prominent marrow edema at L2-3 is favored degenerative but there is right psoas edema and super infection should be clinically considered. 4. Severe foraminal narrowings listed above. 5. Partial coverage of the bladder  shows over distension. Cervical MRI: 1. 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 2. No cord compression or edema to explain the history. Up to mild spinal stenosis at C3-4. 3. Advanced foraminal impingement on the left at C3-4 to C5-6 and on the right at C5-6. Thoracic MRI: 1. Diffuse degenerative disease. No cord edema or compression to explain the history. 2. Left foraminal impingement at T9-10 and T11-12, mainly facet mediated. Electronically Signed   By: [name] M.D.   On: 09/13/2020 08:52   MR THORACIC SPINE WO CONTRAST  Result Date: 09/13/2020 CLINICAL DATA:  Myelopathy, acute or progressive EXAM: MRI CERVICAL, THORACIC AND LUMBAR SPINE WITHOUT CONTRAST TECHNIQUE: Multiplanar and multiecho pulse sequences of the cervical spine, to include the craniocervical junction and cervicothoracic junction, and thoracic and lumbar spine, were obtained without intravenous contrast. COMPARISON:  None. FINDINGS: MRI CERVICAL SPINE FINDINGS Alignment: Degenerative reversal of cervical lordosis with C3-4 and C4-5 anterolisthesis. C3-4 anterolisthesis measures 3 mm. Vertebrae: No fracture, evidence of discitis, or bone lesion. Cord: Normal signal and morphology. Posterior Fossa, vertebral arteries, paraspinal tissues: Negative. Disc levels: C2-3: Mild facet spurring.  Small central disc protrusion. C3-4: Disc narrowing and endplate degeneration with bulging and ridging eccentric to the left there is also greater facet osteoarthritis and spurring. Spinal stenosis with mild ventral cord flattening. Advanced left foraminal impingement C4-5: Disc narrowing and mild bulging. Asymmetric left uncovertebral ridging and left facet spurring. Advanced left foraminal impingement C5-6: Disc narrowing and bulging with endplate and uncovertebral ridging. No cord compression. Advanced biforaminal impingement C6-7: Mild leftward disc bulging and facet spurring. Mild left foraminal stenosis based on gradient  images. C7-T1:Unremarkable. MRI THORACIC SPINE FINDINGS Alignment:  Negative for listhesis. Vertebrae: No fracture, evidence of discitis, or bone lesion. Cord:  Normal signal when allowing for levels of motion artifact Paraspinal and other soft tissues: No perispinal mass or inflammation Disc levels: Generalized disc space narrowing and bulging with small disc protrusions at essentially every thoracic level, the largest being at T5-6. Facet spurring most notable on the left inferiorly with spurs causing left foraminal impingement to a high-grade at T9-10 and T11-12. MRI  LUMBAR SPINE FINDINGS Segmentation: When numbered from above there is a transitional S1 vertebra. Alignment:  Mild levoscoliosis Vertebrae: Edematous signal about the disc spaces of L2-3 and L3-4. Facet marrow edema asymmetric to the right at L2-3. Conus medullaris and cauda equina: Conus extends to the tip terminates at L1-2 level. No conus edema is seen. There is cauda equina redundancy due to the degree of severe spinal stenosis. Paraspinal and other soft tissues: Partial coverage of the bladder shows distension with trabeculated wall thickening. Disc levels: L1-L2: Disc narrowing and bulging with endplate ridging eccentric to the right. Asymmetric right facet spurring. Moderate to advanced right foraminal impingement. Mild spinal stenosis mainly due to epidural fat L2-L3: Right-sided gas and fluid within a large fissure with discogenic marrow edema, overall degenerative appearing although there is some edema signal seen in the right psoas. A large herniation is seen extending superiorly and leftward with severe spinal stenosis. Facet spurring on the right more than left with advanced right foraminal impingement L3-L4: Disc collapse with gas and fluid in a large fissure. The annulus is circumferentialyy of bulging with buttressing osteophytes. Facet spurring on the right more than left. Severe spinal and right foraminal stenosis. Moderate left  foraminal impingement L4-L5: Disc narrowing and bulging with gas and fluid containing fissure eccentric to the left. Degenerative facet spurring on the left more than right with severe left foraminal impingement and advanced spinal stenosis L5-S1:Disc narrowing and bulging with annular fissures. Degenerative facet spurring. Patent spinal canal. Severe left and moderate right foraminal impingement. IMPRESSION: Lumbar spine: 1. 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 2. Severe degenerative spinal stenosis at L2-3 and L3-4. L4-5 high-grade spinal stenosis. At L2-3 the stenosis is associated with a large superiorly migrating disc extrusion. 3. Prominent marrow edema at L2-3 is favored degenerative but there is right psoas edema and super infection should be clinically considered. 4. Severe foraminal narrowings listed above. 5. Partial coverage of the bladder shows over distension. Cervical MRI: 1. 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 2. No cord compression or edema to explain the history. Up to mild spinal stenosis at C3-4. 3. Advanced foraminal impingement on the left at C3-4 to C5-6 and on the right at C5-6. Thoracic MRI: 1. Diffuse degenerative disease. No cord edema or compression to explain the history. 2. Left foraminal impingement at T9-10 and T11-12, mainly facet mediated. Electronically Signed   By: [name] M.D.   On: 09/13/2020 08:52   MR LUMBAR SPINE WO CONTRAST  Result Date: 09/13/2020 CLINICAL DATA:  Myelopathy, acute or progressive EXAM: MRI CERVICAL, THORACIC AND LUMBAR SPINE WITHOUT CONTRAST TECHNIQUE: Multiplanar and multiecho pulse sequences of the cervical spine, to include the craniocervical junction and cervicothoracic junction, and thoracic and lumbar spine, were obtained without intravenous contrast. COMPARISON:  None. FINDINGS: MRI CERVICAL SPINE FINDINGS Alignment: Degenerative reversal of cervical lordosis with C3-4 and C4-5 anterolisthesis.  C3-4 anterolisthesis measures 3 mm. Vertebrae: No fracture, evidence of discitis, or bone lesion. Cord: Normal signal and morphology. Posterior Fossa, vertebral arteries, paraspinal tissues: Negative. Disc levels: C2-3: Mild facet spurring.  Small central disc protrusion. C3-4: Disc narrowing and endplate degeneration with bulging and ridging eccentric to the left there is also greater facet osteoarthritis and spurring. Spinal stenosis with mild ventral cord flattening. Advanced left foraminal impingement C4-5: Disc narrowing and mild bulging. Asymmetric left uncovertebral ridging and left facet spurring. Advanced left foraminal impingement C5-6: Disc narrowing and bulging with endplate and uncovertebral ridging. No cord compression. Advanced biforaminal  impingement C6-7: Mild leftward disc bulging and facet spurring. Mild left foraminal stenosis based on gradient images. C7-T1:Unremarkable. MRI THORACIC SPINE FINDINGS Alignment:  Negative for listhesis. Vertebrae: No fracture, evidence of discitis, or bone lesion. Cord:  Normal signal when allowing for levels of motion artifact Paraspinal and other soft tissues: No perispinal mass or inflammation Disc levels: Generalized disc space narrowing and bulging with small disc protrusions at essentially every thoracic level, the largest being at T5-6. Facet spurring most notable on the left inferiorly with spurs causing left foraminal impingement to a high-grade at T9-10 and T11-12. MRI LUMBAR SPINE FINDINGS Segmentation: When numbered from above there is a transitional S1 vertebra. Alignment:  Mild levoscoliosis Vertebrae: Edematous signal about the disc spaces of L2-3 and L3-4. Facet marrow edema asymmetric to the right at L2-3. Conus medullaris and cauda equina: Conus extends to the tip terminates at L1-2 level. No conus edema is seen. There is cauda equina redundancy due to the degree of severe spinal stenosis. Paraspinal and other soft tissues: Partial coverage of the  bladder shows distension with trabeculated wall thickening. Disc levels: L1-L2: Disc narrowing and bulging with endplate ridging eccentric to the right. Asymmetric right facet spurring. Moderate to advanced right foraminal impingement. Mild spinal stenosis mainly due to epidural fat L2-L3: Right-sided gas and fluid within a large fissure with discogenic marrow edema, overall degenerative appearing although there is some edema signal seen in the right psoas. A large herniation is seen extending superiorly and leftward with severe spinal stenosis. Facet spurring on the right more than left with advanced right foraminal impingement L3-L4: Disc collapse with gas and fluid in a large fissure. The annulus is circumferentialyy of bulging with buttressing osteophytes. Facet spurring on the right more than left. Severe spinal and right foraminal stenosis. Moderate left foraminal impingement L4-L5: Disc narrowing and bulging with gas and fluid containing fissure eccentric to the left. Degenerative facet spurring on the left more than right with severe left foraminal impingement and advanced spinal stenosis L5-S1:Disc narrowing and bulging with annular fissures. Degenerative facet spurring. Patent spinal canal. Severe left and moderate right foraminal impingement. IMPRESSION: Lumbar spine: 1. 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 2. Severe degenerative spinal stenosis at L2-3 and L3-4. L4-5 high-grade spinal stenosis. At L2-3 the stenosis is associated with a large superiorly migrating disc extrusion. 3. Prominent marrow edema at L2-3 is favored degenerative but there is right psoas edema and super infection should be clinically considered. 4. Severe foraminal narrowings listed above. 5. Partial coverage of the bladder shows over distension. Cervical MRI: 1. 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 2. No cord compression or edema to explain the history. Up to mild spinal stenosis at  C3-4. 3. Advanced foraminal impingement on the left at C3-4 to C5-6 and on the right at C5-6. Thoracic MRI: 1. Diffuse degenerative disease. No cord edema or compression to explain the history. 2. Left foraminal impingement at T9-10 and T11-12, mainly facet mediated. Electronically Signed   By: [name] M.D.   On: 09/13/2020 08:52    Medications:  Scheduled: . amLODipine  10 mg Oral Daily  . gabapentin  600 mg Oral BID  . lamoTRIgine  25 mg Oral Daily  . levETIRAcetam  1,500 mg Oral BID  . nicotine  21 mg Transdermal Daily  . QUEtiapine  100 mg Oral QHS  . sertraline  100 mg Oral QPC breakfast   Continuous: . acyclovir 900 mg (09/13/20 0657)  Lumbar spine MRI: 1. 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 2. Severe degenerative spinal stenosis at L2-3 and L3-4. L4-5 high-grade spinal stenosis. At L2-3 the stenosis is associated with a large superiorly migrating disc extrusion. 3. Prominent marrow edema at L2-3 is favored degenerative but there is right psoas edema and super infection should be clinically considered. 4. Severe foraminal narrowings listed above. 5. Partial coverage of the bladder shows over distension.  Cervical MRI: 1. 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 2. No cord compression or edema to explain the history. Up to mild spinal stenosis at C3-4. 3. Advanced foraminal impingement on the left at C3-4 to C5-6 and on the right at C5-6.  Thoracic MRI: 1. Diffuse degenerative disease. No cord edema or compression to explain the history. 2. Left foraminal impingement at T9-10 and T11-12, mainly facet mediated.  Assessment: 60 year old male with a history of epilepsy, bipolar d/o, PTSD and HTN who presents with 3 breakthrough seizures in the setting of possible sepsis.No further sz after starting empiric abx coverage. Xanthochromia and markedly elevated protein noted on CSF, with mildly elevated CSF white  cells at 8.  1. No neurological events and is back to baseline  2. MRI Brain W/WO: Multifocal hyperintense T2-weighted signal within the white matter, nonspecific but may be seen in the setting of chronic microvascular ischemia. Right scalp lesion consistent with epidermoid. 3. CTA head: No intracranial arterial occlusion or high-grade stenosis. No aneurysm or vascular lesion. No intracranial hemorrhage. 4. EEG x 2: Normal. 5. CSF abnormal with elevated RBC, xanthochromia, markedly elevated protein and mildly elevated WBC.  6. CSF cryptococcal antigen negative. CSF cultures no growth x 3 days. HSV pending, Varicella could not be performed due to unavailability of sample 7. Although CSF was xanthochromic, there is no evidence for subarachnoid hemorrhage on MRI brain or CT head, and no aneurysm is seen on CTA head. MRI of cervical, thoracic and lumbar spine also shows no evidence for hemorrhage.   Recommendations: - CSF findings, in conjunction with MRI and overall clinical picture are not consistent with a bacterial meningitis. Vancomycin and cefepime have been stopped. Viral meningitis is possible due to the elevated CSF protein and mildly elevated CSF WBC.  - Would continue acyclovir while awaiting HSV 1/2 PCR on CSF (ordered as add-on to already-collected CSF sample). - Continue Keppra at 1500 mg IV q 12 hrs. -Continue lamotrigine titration @25  mg po qAM (25 mg qd x 1 week, then 25 mg BID x 1 week, then 25 mg QAM and 50 mg QHS x 1 week, then 50 mg BID x 1 week. Following this initial titration schedule, will need to return to his primary Neurologist for continued instructions) - Follow up on CSF cultures -  Of note, on L-spine MRI prominent marrow edema at L2-3 is favored degenerative but there is right psoas edema and super infection should be clinically considered. Would discuss this finding with ID. Of note, the patient also endorses continued low back pain today.     LOS: 5 days    @Electronically  signed: Dr. Kerney Elbe 09/13/2020  10:15 AM

## 2020-09-13 NOTE — Progress Notes (Addendum)
PROGRESS NOTE  Keith Dixon EGB:[id number] DOB: [birth year] DOA: 09/08/2020 PCP: Verline Lema, MD   LOS: 5 days   Brief Narrative / Interim history: 60 year old male with history of seizure disorders, PTSD, hypertension, bipolar who came into the hospital on 4/30 with seizures as well as encephalopathy.  Per reports, patient had a fever, was lethargic and had had a few seizures at home.  He reports compliance with seizure medications however there is some documentation in the past about nonadherence.  He has had a seizure in the past when he had a urinary tract infection.  EMS witnessed 3 seizures and he was given IM Versed on route.  Initial work-up in the ED was negative for an apparent source he was placed on broad-spectrum antibiotics.  An LP was initially attempted in the ED and failed.  He was placed on broad-spectrum antibiotics and admitted to the hospital.  CSF bacterial cultures negative, low index of suspicion for bacterial meningitis, IV antibiotics discontinued 5/4.  CSF HSV 1/2 PCR negative and IV acyclovir discontinued 5/5.  Subjective / 24h Interval events: No recurrence of seizure-like episodes.  Chronic low back pain and denies any worsening.  States that he has been walking with walker assistance and even walked the hallway over the last 2 days.  Reports that he lives with his girlfriend and ambulates with the help of a cane.  Assessment & Plan: Principal Problem Seizures-multiple seizures at home in the setting of fever.  He was placed on IV acyclovir and IV broad-spectrum antibiotics, fever is now controlled and he has remained seizure-free since admission -Mental status back to baseline.   -Communicated with neurology.  Their follow-up appreciated.  As verified with pharmacy: Med history from [facility] records reported patient on lamotrigine 200 mg a.m. and 300 mg p.m.  Neurology have initiated lamotrigine 25 mg daily for the first week with standard titration  schedule (25 mg qd x 1 week, then 25 mg BID x 1 week, then 25 mg QAM and 50 mg QHS x 1 week, then 50 mg BID x 1 week.) and continuing Keppra 1500 mg twice daily.  Will need outpatient neurology follow-up.  Active Problems SIRS -febrile on admission, tachycardic.  Initial work-up relatively unremarkable, chest x-ray without acute findings, he is COVID-negative, urinalysis with few bacteria and trace leukocytes overall not that impressive.  Cultures are negative to date.  Underwent an LP on 5/2 with a WBC, 455 RBC normal glucose, negative gram stain.  CSF HSV 1/2 PCR negative.  CSF VZV PCR could not be done because sample was not received by the lab.  CSF cryptococcal antigen negative.  CSF bacterial culture negative to date after 3 days. - I communicated with infectious disease MD on-call on 5/4 who reviewed his chart and indicated that this is less likely to be bacterial meningitis, and as per recommendations discontinued IV antibiotics, feels that this is a viral process.  He also indicated that since MRI is reassuring and he turned around rather quickly for it to suggest HSV process but reasonable to keep acyclovir until HSV PCR is negative.  Since CSF HSV 1 and 2 PCR negative, discontinued acyclovir. -MRI of L-spine showed "prominent marrow edema at L2-3 is favored degenerative but there is right psoas edema and super infection should be clinically considered."  Discussed with ID who will see patient on 5/6.  Infection felt less likely in the absence of negative cultures but patient may need reimaging.  CSF xanthochromia Xanthochromia and  markedly elevated protein noted on CSF with mildly elevated CSF white cells.  There was no evidence of subarachnoid hemorrhage on MRI brain or CT head, no aneurysm seen on CTA head but there was concern for spinal hemorrhage and hence patient underwent MRI of cervical, thoracic and lumbar spine which was negative for hemorrhage.  Acute metabolic encephalopathy -in the  setting of fever, seizures, postictal state.  CT head was negative.  Resolved and back to his baseline.  Tobacco use-nicotine patch.  Cessation counseled.  Essential hypertension-continue Norvasc.  Uncontrolled. Resumed home HCTZ/ACEI.  PTSD, bipolar-continue home medications  Thrombocytopenia: Resolved.  Anemia, suspect chronic disease Stable.  Hypokalemia/hypomagnesemia Replaced.  Chronic low back pain/lumbar spinal stenosis Consider outpatient neurosurgery/spine surgery evaluation.  Scheduled Meds: . amLODipine  10 mg Oral Daily  . gabapentin  600 mg Oral BID  . lamoTRIgine  25 mg Oral Daily  . levETIRAcetam  1,500 mg Oral BID  . nicotine  21 mg Transdermal Daily  . QUEtiapine  100 mg Oral QHS  . sertraline  100 mg Oral QPC breakfast   Continuous Infusions: . acyclovir 900 mg (09/13/20 1437)   PRN Meds:.acetaminophen **OR** acetaminophen, albuterol, baclofen, hydrALAZINE, hydrOXYzine, LORazepam, ondansetron **OR** ondansetron (ZOFRAN) IV  Diet Orders (From admission, onward)    Start     Ordered   09/08/20 2225  Diet Heart Room service appropriate? Yes; Fluid consistency: Thin  Diet effective now       Question Answer Comment  Room service appropriate? Yes   Fluid consistency: Thin      09/08/20 2224          DVT prophylaxis: SCDs Start: 09/08/20 1542     Code Status: Full Code  Family Communication: Discussed in detail with patient's girlfriend on patient's speaker phone at bedside on 5/4.  Updated care and answered questions.  Status is: Inpatient  Remains inpatient appropriate because:Inpatient level of care appropriate due to severity of illness   Dispo: The patient is from: Home              Anticipated d/c is to: Home              Patient currently is not medically stable to d/c.   Difficult to place patient No  Level of care: Progressive  Consultants:  Neurology Infectious disease will see on 5/6.  Procedures:  Lumbar  puncture.  Microbiology  Blood cultures-negative to date Urine cultures-negative  Antimicrobials: Vancomycin 4/30 >> 5/4 Cefepime 4/30 >> 5/4 Acyclovir 4/30 >> 5/5.   Objective: Vitals:   09/12/20 2341 09/13/20 0449 09/13/20 0804 09/13/20 1155  BP: 140/62 (!) 158/108 (!) 174/97 (!) 168/94  Pulse: 95 83 86 79  Resp: (!) 21 (!) 21 20 20   Temp: 98.6 F (37 C) 98.8 F (37.1 C) 98.8 F (37.1 C) 98.6 F (37 C)  TempSrc: Oral Oral Oral Oral  SpO2: 96% 100% 98% 97%  Weight:      Height:        Intake/Output Summary (Last 24 hours) at 09/13/2020 1522 Last data filed at 09/13/2020 1300 Gross per 24 hour  Intake 240 ml  Output 1275 ml  Net -1035 ml   Filed Weights   09/08/20 1133  Weight: 113.8 kg    Examination:  General exam: Pleasant young male, moderately built and obese sitting up comfortably in bed eating breakfast this morning. Respiratory system: Clear to auscultation. Cardiovascular system: S1 and S2 heard, RRR.  No JVD, murmurs or pedal edema.  Telemetry personally  reviewed: Sinus rhythm. Gastrointestinal system: Abdomen is nondistended, soft and nontender. No organomegaly or masses felt. Normal bowel sounds heard. Central nervous system: Alert and oriented. No focal neurological deficits.  Neck supple. Extremities: Symmetric 5 x 5 power. Skin: No rashes, lesions or ulcers Psychiatry: Judgement and insight appear normal. Mood & affect appropriate.     Data Reviewed: I have independently reviewed following labs and imaging studies   CBC: Recent Labs  Lab 09/08/20 1136 09/08/20 1208 09/09/20 0209 09/10/20 0330 09/13/20 0428  WBC 8.3  --  10.2 9.9 5.5  NEUTROABS 7.8*  --   --   --   --   HGB 13.5 14.3 12.3* 11.7* 12.4*  HCT 41.6 42.0 36.9* 35.2* 38.7*  MCV 83.0  --  81.3 82.8 82.9  PLT 176  --  102* 107* [id number]   Basic Metabolic Panel: Recent Labs  Lab 09/08/20 2120 09/09/20 0209 09/10/20 0330 09/11/20 0445 09/12/20 0353 09/13/20 0428  NA  --  135  139 138 139 138  K  --  3.2* 3.3* 4.2 3.8 4.2  CL  --  103 108 109 111 105  CO2  --  20* 23 19* 26 25  GLUCOSE  --  84 108* 121* 114* 112*  BUN  --  17 17 12 10 10   CREATININE  --  1.12 1.27* 1.00 0.88 0.87  CALCIUM  --  8.7* 8.8* 8.8* 8.9 9.5  MG 1.7 1.5*  --   --  1.8  --   PHOS  --  1.6*  --   --  3.6  --    Liver Function Tests: Recent Labs  Lab 09/08/20 1136 09/10/20 0330  AST 50* 25  ALT 28 21  ALKPHOS 74 57  BILITOT 2.3* 0.8  PROT 6.7 5.6*  ALBUMIN 4.0 2.9*   Coagulation Profile: Recent Labs  Lab 09/08/20 1136  INR 1.0   HbA1C: No results for input(s): HGBA1C in the last 72 hours. CBG: Recent Labs  Lab 09/08/20 1129  GLUCAP 121*    Recent Results (from the past 240 hour(s))  Resp Panel by RT-PCR (Flu A&B, Covid) Nasopharyngeal Swab     Status: None   Collection Time: 09/08/20 11:36 AM   Specimen: Nasopharyngeal Swab; Nasopharyngeal(NP) swabs in vial transport medium  Result Value Ref Range Status   SARS Coronavirus 2 by RT PCR NEGATIVE NEGATIVE Final    Comment: (NOTE) SARS-CoV-2 target nucleic acids are NOT DETECTED.  The SARS-CoV-2 RNA is generally detectable in upper respiratory specimens during the acute phase of infection. The lowest concentration of SARS-CoV-2 viral copies this assay can detect is 138 copies/mL. A negative result does not preclude SARS-Cov-2 infection and should not be used as the sole basis for treatment or other patient management decisions. A negative result may occur with  improper specimen collection/handling, submission of specimen other than nasopharyngeal swab, presence of viral mutation(s) within the areas targeted by this assay, and inadequate number of viral copies(<138 copies/mL). A negative result must be combined with clinical observations, patient history, and epidemiological information. The expected result is Negative.  Fact Sheet for Patients:  EntrepreneurPulse.com.au  Fact Sheet for  Healthcare Providers:  IncredibleEmployment.be  This test is no t yet approved or cleared by the [place] FDA and  has been authorized for detection and/or diagnosis of SARS-CoV-2 by FDA under an Emergency Use Authorization (EUA). This EUA will remain  in effect (meaning this test can be used) for the duration of the COVID-19 declaration  under Section 564(b)(1) of the Act, 21 U.S.C.section 360bbb-3(b)(1), unless the authorization is terminated  or revoked sooner.       Influenza A by PCR NEGATIVE NEGATIVE Final   Influenza B by PCR NEGATIVE NEGATIVE Final    Comment: (NOTE) The Xpert Xpress SARS-CoV-2/FLU/RSV plus assay is intended as an aid in the diagnosis of influenza from Nasopharyngeal swab specimens and should not be used as a sole basis for treatment. Nasal washings and aspirates are unacceptable for Xpert Xpress SARS-CoV-2/FLU/RSV testing.  Fact Sheet for Patients: EntrepreneurPulse.com.au  Fact Sheet for Healthcare Providers: IncredibleEmployment.be  This test is not yet approved or cleared by the [place] FDA and has been authorized for detection and/or diagnosis of SARS-CoV-2 by FDA under an Emergency Use Authorization (EUA). This EUA will remain in effect (meaning this test can be used) for the duration of the COVID-19 declaration under Section 564(b)(1) of the Act, 21 U.S.C. section 360bbb-3(b)(1), unless the authorization is terminated or revoked.  Performed at [facility], [place] [address], [place], [place] [address]   Blood Culture (routine x 2)     Status: None   Collection Time: 09/08/20 11:36 AM   Specimen: BLOOD RIGHT HAND  Result Value Ref Range Status   Specimen Description BLOOD RIGHT HAND  Final   Special Requests   Final    BOTTLES DRAWN AEROBIC AND ANAEROBIC Blood Culture adequate volume   Culture   Final    NO GROWTH 5 DAYS Performed at [facility], [place] [address], [place], [place] [address]    Report Status 09/13/2020 FINAL  Final  Blood Culture (routine x 2)     Status: None   Collection Time: 09/08/20 11:41 AM   Specimen: BLOOD RIGHT FOREARM  Result Value Ref Range Status   Specimen Description BLOOD RIGHT FOREARM  Final   Special Requests   Final    BOTTLES DRAWN AEROBIC AND ANAEROBIC Blood Culture adequate volume   Culture   Final    NO GROWTH 5 DAYS Performed at [facility], [place] [address] [place], [place] [address]    Report Status 09/13/2020 FINAL  Final  Urine culture     Status: None   Collection Time: 09/08/20  3:42 PM   Specimen: In/Out Cath Urine  Result Value Ref Range Status   Specimen Description IN/OUT CATH URINE  Final   Special Requests NONE  Final   Culture   Final    NO GROWTH Performed at [facility], [place] [address], [place], [place] [address]    Report Status 09/09/2020 FINAL  Final  CSF culture w Gram Stain     Status: None (Preliminary result)   Collection Time: 09/10/20  2:18 PM   Specimen: PATH Cytology CSF; Cerebrospinal Fluid  Result Value Ref Range Status   Specimen Description CSF  Final   Special Requests NONE  Final   Gram Stain   Final    WBC PRESENT,BOTH PMN AND MONONUCLEAR NO ORGANISMS SEEN CYTOSPIN SMEAR    Culture   Final    NO GROWTH 3 DAYS Performed at [facility], [place] [address], [place], [place] [address]    Report Status PENDING  Incomplete     Radiology Studies: MR CERVICAL SPINE WO CONTRAST  Result Date: 09/13/2020 CLINICAL DATA:  Myelopathy, acute or progressive EXAM: MRI CERVICAL, THORACIC AND LUMBAR SPINE WITHOUT CONTRAST TECHNIQUE: Multiplanar and multiecho pulse sequences of the cervical spine, to include the craniocervical  junction and cervicothoracic junction, and thoracic and lumbar spine, were obtained without intravenous contrast. COMPARISON:  None. FINDINGS: MRI CERVICAL SPINE FINDINGS Alignment: Degenerative reversal of cervical lordosis with C3-4 and  C4-5 anterolisthesis. C3-4 anterolisthesis measures 3 mm. Vertebrae: No fracture, evidence of discitis, or bone lesion. Cord: Normal signal and morphology. Posterior Fossa, vertebral arteries, paraspinal tissues: Negative. Disc levels: C2-3: Mild facet spurring.  Small central disc protrusion. C3-4: Disc narrowing and endplate degeneration with bulging and ridging eccentric to the left there is also greater facet osteoarthritis and spurring. Spinal stenosis with mild ventral cord flattening. Advanced left foraminal impingement C4-5: Disc narrowing and mild bulging. Asymmetric left uncovertebral ridging and left facet spurring. Advanced left foraminal impingement C5-6: Disc narrowing and bulging with endplate and uncovertebral ridging. No cord compression. Advanced biforaminal impingement C6-7: Mild leftward disc bulging and facet spurring. Mild left foraminal stenosis based on gradient images. C7-T1:Unremarkable. MRI THORACIC SPINE FINDINGS Alignment:  Negative for listhesis. Vertebrae: No fracture, evidence of discitis, or bone lesion. Cord:  Normal signal when allowing for levels of motion artifact Paraspinal and other soft tissues: No perispinal mass or inflammation Disc levels: Generalized disc space narrowing and bulging with small disc protrusions at essentially every thoracic level, the largest being at T5-6. Facet spurring most notable on the left inferiorly with spurs causing left foraminal impingement to a high-grade at T9-10 and T11-12. MRI LUMBAR SPINE FINDINGS Segmentation: When numbered from above there is a transitional S1 vertebra. Alignment:  Mild levoscoliosis Vertebrae: Edematous signal about the disc spaces of L2-3 and L3-4. Facet marrow edema asymmetric to the right at L2-3. Conus medullaris and cauda equina: Conus extends to the tip terminates at L1-2 level. No conus edema is seen. There is cauda equina redundancy due to the degree of severe spinal stenosis. Paraspinal and other soft tissues:  Partial coverage of the bladder shows distension with trabeculated wall thickening. Disc levels: L1-L2: Disc narrowing and bulging with endplate ridging eccentric to the right. Asymmetric right facet spurring. Moderate to advanced right foraminal impingement. Mild spinal stenosis mainly due to epidural fat L2-L3: Right-sided gas and fluid within a large fissure with discogenic marrow edema, overall degenerative appearing although there is some edema signal seen in the right psoas. A large herniation is seen extending superiorly and leftward with severe spinal stenosis. Facet spurring on the right more than left with advanced right foraminal impingement L3-L4: Disc collapse with gas and fluid in a large fissure. The annulus is circumferentialyy of bulging with buttressing osteophytes. Facet spurring on the right more than left. Severe spinal and right foraminal stenosis. Moderate left foraminal impingement L4-L5: Disc narrowing and bulging with gas and fluid containing fissure eccentric to the left. Degenerative facet spurring on the left more than right with severe left foraminal impingement and advanced spinal stenosis L5-S1:Disc narrowing and bulging with annular fissures. Degenerative facet spurring. Patent spinal canal. Severe left and moderate right foraminal impingement. IMPRESSION: Lumbar spine: 1. 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 2. Severe degenerative spinal stenosis at L2-3 and L3-4. L4-5 high-grade spinal stenosis. At L2-3 the stenosis is associated with a large superiorly migrating disc extrusion. 3. Prominent marrow edema at L2-3 is favored degenerative but there is right psoas edema and super infection should be clinically considered. 4. Severe foraminal narrowings listed above. 5. Partial coverage of the bladder shows over distension. Cervical MRI: 1. 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 2. No cord compression or edema to explain the history. Up to mild  spinal stenosis at C3-4. 3. Advanced foraminal impingement on the left at C3-4 to C5-6 and on the right at C5-6. Thoracic MRI: 1. Diffuse degenerative disease. No cord edema or compression to explain the history. 2. Left foraminal impingement at T9-10 and T11-12, mainly facet mediated. Electronically Signed   By: [name] M.D.   On: 09/13/2020 08:52   MR THORACIC SPINE WO CONTRAST  Result Date: 09/13/2020 CLINICAL DATA:  Myelopathy, acute or progressive EXAM: MRI CERVICAL, THORACIC AND LUMBAR SPINE WITHOUT CONTRAST TECHNIQUE: Multiplanar and multiecho pulse sequences of the cervical spine, to include the craniocervical junction and cervicothoracic junction, and thoracic and lumbar spine, were obtained without intravenous contrast. COMPARISON:  None. FINDINGS: MRI CERVICAL SPINE FINDINGS Alignment: Degenerative reversal of cervical lordosis with C3-4 and C4-5 anterolisthesis. C3-4 anterolisthesis measures 3 mm. Vertebrae: No fracture, evidence of discitis, or bone lesion. Cord: Normal signal and morphology. Posterior Fossa, vertebral arteries, paraspinal tissues: Negative. Disc levels: C2-3: Mild facet spurring.  Small central disc protrusion. C3-4: Disc narrowing and endplate degeneration with bulging and ridging eccentric to the left there is also greater facet osteoarthritis and spurring. Spinal stenosis with mild ventral cord flattening. Advanced left foraminal impingement C4-5: Disc narrowing and mild bulging. Asymmetric left uncovertebral ridging and left facet spurring. Advanced left foraminal impingement C5-6: Disc narrowing and bulging with endplate and uncovertebral ridging. No cord compression. Advanced biforaminal impingement C6-7: Mild leftward disc bulging and facet spurring. Mild left foraminal stenosis based on gradient images. C7-T1:Unremarkable. MRI THORACIC SPINE FINDINGS Alignment:  Negative for listhesis. Vertebrae: No fracture, evidence of discitis, or bone lesion. Cord:  Normal signal  when allowing for levels of motion artifact Paraspinal and other soft tissues: No perispinal mass or inflammation Disc levels: Generalized disc space narrowing and bulging with small disc protrusions at essentially every thoracic level, the largest being at T5-6. Facet spurring most notable on the left inferiorly with spurs causing left foraminal impingement to a high-grade at T9-10 and T11-12. MRI LUMBAR SPINE FINDINGS Segmentation: When numbered from above there is a transitional S1 vertebra. Alignment:  Mild levoscoliosis Vertebrae: Edematous signal about the disc spaces of L2-3 and L3-4. Facet marrow edema asymmetric to the right at L2-3. Conus medullaris and cauda equina: Conus extends to the tip terminates at L1-2 level. No conus edema is seen. There is cauda equina redundancy due to the degree of severe spinal stenosis. Paraspinal and other soft tissues: Partial coverage of the bladder shows distension with trabeculated wall thickening. Disc levels: L1-L2: Disc narrowing and bulging with endplate ridging eccentric to the right. Asymmetric right facet spurring. Moderate to advanced right foraminal impingement. Mild spinal stenosis mainly due to epidural fat L2-L3: Right-sided gas and fluid within a large fissure with discogenic marrow edema, overall degenerative appearing although there is some edema signal seen in the right psoas. A large herniation is seen extending superiorly and leftward with severe spinal stenosis. Facet spurring on the right more than left with advanced right foraminal impingement L3-L4: Disc collapse with gas and fluid in a large fissure. The annulus is circumferentialyy of bulging with buttressing osteophytes. Facet spurring on the right more than left. Severe spinal and right foraminal stenosis. Moderate left foraminal impingement L4-L5: Disc narrowing and bulging with gas and fluid containing fissure eccentric to the left. Degenerative facet spurring on the left more than right with  severe left foraminal impingement and advanced spinal stenosis L5-S1:Disc narrowing and bulging with annular fissures. Degenerative facet spurring. Patent spinal canal. Severe left and moderate  right foraminal impingement. IMPRESSION: Lumbar spine: 1. 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 2. Severe degenerative spinal stenosis at L2-3 and L3-4. L4-5 high-grade spinal stenosis. At L2-3 the stenosis is associated with a large superiorly migrating disc extrusion. 3. Prominent marrow edema at L2-3 is favored degenerative but there is right psoas edema and super infection should be clinically considered. 4. Severe foraminal narrowings listed above. 5. Partial coverage of the bladder shows over distension. Cervical MRI: 1. 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 2. No cord compression or edema to explain the history. Up to mild spinal stenosis at C3-4. 3. Advanced foraminal impingement on the left at C3-4 to C5-6 and on the right at C5-6. Thoracic MRI: 1. Diffuse degenerative disease. No cord edema or compression to explain the history. 2. Left foraminal impingement at T9-10 and T11-12, mainly facet mediated. Electronically Signed   By: Marnee Spring M.D.   On: 09/13/2020 08:52   MR LUMBAR SPINE WO CONTRAST  Result Date: 09/13/2020 CLINICAL DATA:  Myelopathy, acute or progressive EXAM: MRI CERVICAL, THORACIC AND LUMBAR SPINE WITHOUT CONTRAST TECHNIQUE: Multiplanar and multiecho pulse sequences of the cervical spine, to include the craniocervical junction and cervicothoracic junction, and thoracic and lumbar spine, were obtained without intravenous contrast. COMPARISON:  None. FINDINGS: MRI CERVICAL SPINE FINDINGS Alignment: Degenerative reversal of cervical lordosis with C3-4 and C4-5 anterolisthesis. C3-4 anterolisthesis measures 3 mm. Vertebrae: No fracture, evidence of discitis, or bone lesion. Cord: Normal signal and morphology. Posterior Fossa, vertebral arteries,  paraspinal tissues: Negative. Disc levels: C2-3: Mild facet spurring.  Small central disc protrusion. C3-4: Disc narrowing and endplate degeneration with bulging and ridging eccentric to the left there is also greater facet osteoarthritis and spurring. Spinal stenosis with mild ventral cord flattening. Advanced left foraminal impingement C4-5: Disc narrowing and mild bulging. Asymmetric left uncovertebral ridging and left facet spurring. Advanced left foraminal impingement C5-6: Disc narrowing and bulging with endplate and uncovertebral ridging. No cord compression. Advanced biforaminal impingement C6-7: Mild leftward disc bulging and facet spurring. Mild left foraminal stenosis based on gradient images. C7-T1:Unremarkable. MRI THORACIC SPINE FINDINGS Alignment:  Negative for listhesis. Vertebrae: No fracture, evidence of discitis, or bone lesion. Cord:  Normal signal when allowing for levels of motion artifact Paraspinal and other soft tissues: No perispinal mass or inflammation Disc levels: Generalized disc space narrowing and bulging with small disc protrusions at essentially every thoracic level, the largest being at T5-6. Facet spurring most notable on the left inferiorly with spurs causing left foraminal impingement to a high-grade at T9-10 and T11-12. MRI LUMBAR SPINE FINDINGS Segmentation: When numbered from above there is a transitional S1 vertebra. Alignment:  Mild levoscoliosis Vertebrae: Edematous signal about the disc spaces of L2-3 and L3-4. Facet marrow edema asymmetric to the right at L2-3. Conus medullaris and cauda equina: Conus extends to the tip terminates at L1-2 level. No conus edema is seen. There is cauda equina redundancy due to the degree of severe spinal stenosis. Paraspinal and other soft tissues: Partial coverage of the bladder shows distension with trabeculated wall thickening. Disc levels: L1-L2: Disc narrowing and bulging with endplate ridging eccentric to the right. Asymmetric right  facet spurring. Moderate to advanced right foraminal impingement. Mild spinal stenosis mainly due to epidural fat L2-L3: Right-sided gas and fluid within a large fissure with discogenic marrow edema, overall degenerative appearing although there is some edema signal seen in the right psoas. A large herniation is seen extending superiorly and leftward with severe spinal stenosis.  Facet spurring on the right more than left with advanced right foraminal impingement L3-L4: Disc collapse with gas and fluid in a large fissure. The annulus is circumferentialyy of bulging with buttressing osteophytes. Facet spurring on the right more than left. Severe spinal and right foraminal stenosis. Moderate left foraminal impingement L4-L5: Disc narrowing and bulging with gas and fluid containing fissure eccentric to the left. Degenerative facet spurring on the left more than right with severe left foraminal impingement and advanced spinal stenosis L5-S1:Disc narrowing and bulging with annular fissures. Degenerative facet spurring. Patent spinal canal. Severe left and moderate right foraminal impingement. IMPRESSION: Lumbar spine: 1. 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 2. Severe degenerative spinal stenosis at L2-3 and L3-4. L4-5 high-grade spinal stenosis. At L2-3 the stenosis is associated with a large superiorly migrating disc extrusion. 3. Prominent marrow edema at L2-3 is favored degenerative but there is right psoas edema and super infection should be clinically considered. 4. Severe foraminal narrowings listed above. 5. Partial coverage of the bladder shows over distension. Cervical MRI: 1. 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 2. No cord compression or edema to explain the history. Up to mild spinal stenosis at C3-4. 3. Advanced foraminal impingement on the left at C3-4 to C5-6 and on the right at C5-6. Thoracic MRI: 1. Diffuse degenerative disease. No cord edema or compression to  explain the history. 2. Left foraminal impingement at T9-10 and T11-12, mainly facet mediated. Electronically Signed   By: [name] M.D.   On: 09/13/2020 08:52    Vernell Leep, MD, [place], [facility]. [facility]  To contact the attending provider between 7A-7P or the covering provider during after hours 7P-7A, please log into the web site [email or web address] and access using universal [place] password for that web site. If you do not have the password, please call the hospital operator.

## 2020-09-13 NOTE — Evaluation (Signed)
Occupational Therapy Evaluation Patient Details Name: Keith Dixon MRN: [id number] DOB: 11/21/1960 Today's Date: 09/13/2020    History of Present Illness Pt is a 60 y.o. M who presents with 3 breakthrough seizures in setting of possible sepsis. No further seizures after starting empiric abx coverage. EEGs on 5/1 and 5/2 were normal. Significant PMH: epilepsy, bipolar disorder, PTSD, HTN.   Clinical Impression   Patient admitted for the diagnosis above.  PTA he lives in an apartment alone.  He has a SO that lives nearby, and his daughter is disabled suffering from [place].  The patient states he was using a SPC for mobility, needed assist with community mobility, meds and bill payment, but could care for his ADL, light meal prep and light home management.  Barriers are listed below.  Currently he is needing up to [facility] and a RW for mobility, and up to [employer] for ADL completion from a sit/stand level.  His plan is to return home with assist from family and home health services.  Acute OT will defer further OT to post acute services.      Follow Up Recommendations  Home health OT    Equipment Recommendations  3 in 1 bedside commode    Recommendations for Other Services       Precautions / Restrictions Precautions Precautions: Fall;Other (comment) Precaution Comments: seizure precautions Restrictions Weight Bearing Restrictions: No      Mobility Bed Mobility Overal bed mobility: Needs Assistance Bed Mobility: Supine to Sit;Sit to Supine     Supine to sit: Supervision Sit to supine: Supervision        Transfers Overall transfer level: Needs assistance Equipment used: Rolling walker (2 wheeled) Transfers: Sit to/from Stand Sit to Stand: Min guard              Balance Overall balance assessment: Needs assistance Sitting-balance support: Feet supported Sitting balance-Leahy Scale: Good     Standing balance support: Bilateral upper extremity supported Standing  balance-Leahy Scale: Poor Standing balance comment: reliant on walker                           ADL either performed or assessed with clinical judgement   ADL Overall ADL's : Needs assistance/impaired Eating/Feeding: Independent   Grooming: Wash/dry hands;Supervision/safety;Standing           Upper Body Dressing : Set up;Sitting   Lower Body Dressing: Supervision/safety;Sit to/from stand Lower Body Dressing Details (indicate cue type and reason): able to perform figure 4 bil. to access feet and don bil. socks EOB             Functional mobility during ADLs: Supervision/safety;Rolling walker       Vision Baseline Vision/History: Wears glasses Wears Glasses: At all times Patient Visual Report: No change from baseline       Perception     Praxis      Pertinent Vitals/Pain Pain Assessment: 0-10 Pain Score: 6  Faces Pain Scale: Hurts even more Pain Location: bilateral hips (chronic),radiates into his buttocks and thighs Pain Descriptors / Indicators: Grimacing;Guarding;Moaning Pain Intervention(s): Monitored during session     Hand Dominance Right   Extremity/Trunk Assessment Upper Extremity Assessment Upper Extremity Assessment: Overall WFL for tasks assessed   Lower Extremity Assessment Lower Extremity Assessment: Defer to PT evaluation   Cervical / Trunk Assessment Cervical / Trunk Assessment: Kyphotic   Communication Communication Communication: No difficulties   Cognition Arousal/Alertness: Awake/alert Behavior During Therapy: [facility] for  tasks assessed/performed Overall Cognitive Status: Impaired/Different from baseline Area of Impairment: Awareness;Safety/judgement                         Safety/Judgement: Decreased awareness of deficits Awareness: Emergent   General Comments: frequent cues for safety and posture during hallway navigation, pt laughing throughout session both appropriately and inappropriately   General  Comments       Exercises     Shoulder Instructions      Home Living Family/patient expects to be discharged to:: Private residence Living Arrangements: Alone Available Help at Discharge: Family;Friend(s) Type of Home: Apartment Home Access: Level entry     Home Layout: Able to live on main level with bedroom/bathroom     Bathroom Shower/Tub: Teacher, early years/pre: Standard Bathroom Accessibility: Yes   Home Equipment: Cane - single point          Prior Functioning/Environment Level of Independence: Needs assistance  Gait / Transfers Assistance Needed: using cane, denies falls. ADL's / Homemaking Assistance Needed: Pt reports he takes tub baths   Comments: does not drive, independent IADL's        OT Problem List: Decreased activity tolerance;Impaired balance (sitting and/or standing);Pain      OT Treatment/Interventions:      OT Goals(Current goals can be found in the care plan section) Acute Rehab OT Goals Patient Stated Goal: be able to keep moving OT Goal Formulation: With patient Time For Goal Achievement: 09/13/20 Potential to Achieve Goals: Good  OT Frequency:     Barriers to D/C:  none noted          Co-evaluation              AM-PAC OT "6 Clicks" Daily Activity     Outcome Measure Help from another person eating meals?: None Help from another person taking care of personal grooming?: None Help from another person toileting, which includes using toliet, bedpan, or urinal?: None Help from another person bathing (including washing, rinsing, drying)?: A Little Help from another person to put on and taking off regular upper body clothing?: None Help from another person to put on and taking off regular lower body clothing?: A Little 6 Click Score: 22   End of Session Equipment Utilized During Treatment: Gait belt;Rolling walker Nurse Communication: Mobility status  Activity Tolerance: Patient limited by fatigue Patient left: in  bed;with call bell/phone within reach;with bed alarm set  OT Visit Diagnosis: Unsteadiness on feet (R26.81);Pain Pain - Right/Left: Left Pain - part of body: Hip;Leg                Time: 1611-1630 OT Time Calculation (min): 19 min Charges:  OT General Charges $OT Visit: 1 Visit OT Evaluation $OT Eval Moderate Complexity: 1 Mod  09/13/2020  Rich, OTR/L  Acute Rehabilitation Services  Office:  [phone]   Metta Clines 09/13/2020, 4:49 PM
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
## 2020-09-14 DIAGNOSIS — F445 Conversion disorder with seizures or convulsions: Secondary | ICD-10-CM | POA: Diagnosis not present

## 2020-09-14 DIAGNOSIS — R6510 Systemic inflammatory response syndrome (SIRS) of non-infectious origin without acute organ dysfunction: Secondary | ICD-10-CM

## 2020-09-14 DIAGNOSIS — I10 Essential (primary) hypertension: Secondary | ICD-10-CM | POA: Diagnosis not present

## 2020-09-14 DIAGNOSIS — F4310 Post-traumatic stress disorder, unspecified: Secondary | ICD-10-CM | POA: Diagnosis not present

## 2020-09-14 DIAGNOSIS — R9389 Abnormal findings on diagnostic imaging of other specified body structures: Secondary | ICD-10-CM | POA: Diagnosis not present

## 2020-09-14 DIAGNOSIS — F319 Bipolar disorder, unspecified: Secondary | ICD-10-CM

## 2020-09-14 DIAGNOSIS — Z720 Tobacco use: Secondary | ICD-10-CM

## 2020-09-14 DIAGNOSIS — R937 Abnormal findings on diagnostic imaging of other parts of musculoskeletal system: Secondary | ICD-10-CM

## 2020-09-14 DIAGNOSIS — R509 Fever, unspecified: Secondary | ICD-10-CM | POA: Diagnosis not present

## 2020-09-14 DIAGNOSIS — G40A11 Absence epileptic syndrome, intractable, with status epilepticus: Secondary | ICD-10-CM

## 2020-09-14 LAB — C-REACTIVE PROTEIN: CRP: 1.1 mg/dL — ABNORMAL HIGH (ref <1.0)

## 2020-09-14 LAB — CSF CULTURE W GRAM STAIN: Culture: NO GROWTH

## 2020-09-14 LAB — BASIC METABOLIC PANEL
Anion gap: 8 (ref 5–15)
BUN: 16 mg/dL (ref 6–20)
CO2: 24 mmol/L (ref 22–32)
Calcium: 9.7 mg/dL (ref 8.9–10.3)
Chloride: 104 mmol/L (ref 98–111)
Creatinine, Ser: 0.95 mg/dL (ref 0.61–1.24)
GFR, Estimated: >60 mL/min (ref >60)
Glucose, Bld: 111 mg/dL — ABNORMAL HIGH (ref 70–99)
Potassium: 4 mmol/L (ref 3.5–5.1)
Sodium: 136 mmol/L (ref 135–145)

## 2020-09-14 LAB — SEDIMENTATION RATE: Sed Rate: 9 mm/hr (ref 0–16)

## 2020-09-14 MED ORDER — LAMOTRIGINE 100 MG PO TABS: 100.0000 mg | ORAL_TABLET | Freq: Two times a day (BID) | ORAL | 2 refills | Status: DC

## 2020-09-14 NOTE — Progress Notes (Signed)
Physical Therapy Treatment Patient Details Name: Keith Dixon MRN: [id number] DOB: 1960/08/19 Today's Date: 09/14/2020    History of Present Illness Pt is a 60 y.o. M who presents with 3 breakthrough seizures in setting of possible sepsis. No further seizures after starting empiric abx coverage. EEGs on 5/1 and 5/2 were normal. Significant PMH: epilepsy, bipolar disorder, PTSD, HTN.    PT Comments    The pt continues to make progress with mobility and independence at this time. The pt was able to complete transfers with use of RW but no assist needed, and was able to steady with BUE support once in standing. The pt remains most limited by low back pain and bilateral hip pain (chronic), but was able to work through the pain to complete hallway mobility and stairs at this time. The pt was educated in continued mobility/HEP for home, verbalized understanding of education and importance of continued mobility. Also educated in multiple stair techniques, and verbalized good understanding of safety with mobility at home. Will continue to benefit from HHPT and eventual OPPT to manage low back pain and improved targeted strengthening to allow for improved stability and endurance with mobility.    Follow Up Recommendations  Home health PT;Supervision for mobility/OOB     Equipment Recommendations  Rolling walker with 5" wheels    Recommendations for Other Services       Precautions / Restrictions Precautions Precautions: Fall;Other (comment) Precaution Comments: seizure precautions Restrictions Weight Bearing Restrictions: No    Mobility  Bed Mobility Overal bed mobility: Needs Assistance Bed Mobility: Supine to Sit     Supine to sit: Supervision     General bed mobility comments: supervision for safety, no assist needed    Transfers Overall transfer level: Needs assistance Equipment used: Rolling walker (2 wheeled) Transfers: Sit to/from Stand Sit to Stand: Supervision          General transfer comment: for safety, no assist needed despite slow power up to standing  Ambulation/Gait Ambulation/Gait assistance: Min guard Gait Distance (Feet): 100 Feet Assistive device: Rolling walker (2 wheeled) Gait Pattern/deviations: Step-through pattern;Decreased stride length;Trunk flexed;Antalgic Gait velocity: decr Gait velocity interpretation: <1.31 ft/sec, indicative of household ambulator General Gait Details: minG for safety, pt with increased trunk flexion due to reports of low back pain that is exacerbated in extension. cues for placement in RW x 3, especially with nagivation in tight spaces   Stairs Stairs: Yes Stairs assistance: Min guard Stair Management: Two rails;Step to pattern;Forwards Number of Stairs: 2 General stair comments: minG for safety, discussed side stepping with BUE support on rails as well       Balance Overall balance assessment: Needs assistance Sitting-balance support: Feet supported Sitting balance-Leahy Scale: Good     Standing balance support: Bilateral upper extremity supported Standing balance-Leahy Scale: Poor Standing balance comment: reliant on walker                            Cognition Arousal/Alertness: Awake/alert Behavior During Therapy: WFL for tasks assessed/performed Overall Cognitive Status: Impaired/Different from baseline Area of Impairment: Awareness;Safety/judgement                         Safety/Judgement: Decreased awareness of deficits Awareness: Emergent   General Comments: cues for safety, use of RW, pt able to adopt well despite significant internal distraction from pain      Exercises      General Comments General  comments (skin integrity, edema, etc.): VSS on RA      Pertinent Vitals/Pain Pain Assessment: Faces Faces Pain Scale: Hurts whole lot Pain Location: low back, bilateral hips (chronic) Pain Descriptors / Indicators: Grimacing;Guarding;Moaning Pain  Intervention(s): Limited activity within patient's tolerance;Monitored during session;Repositioned           PT Goals (current goals can now be found in the care plan section) Acute Rehab PT Goals Patient Stated Goal: be able to keep moving PT Goal Formulation: With patient Time For Goal Achievement: 09/25/20 Potential to Achieve Goals: Good Progress towards PT goals: Progressing toward goals    Frequency    Min 3X/week      PT Plan Current plan remains appropriate       AM-PAC PT "6 Clicks" Mobility   Outcome Measure  Help needed turning from your back to your side while in a flat bed without using bedrails?: None Help needed moving from lying on your back to sitting on the side of a flat bed without using bedrails?: None Help needed moving to and from a bed to a chair (including a wheelchair)?: A Little Help needed standing up from a chair using your arms (e.g., wheelchair or bedside chair)?: A Little Help needed to walk in hospital room?: A Little Help needed climbing 3-5 steps with a railing? : A Little 6 Click Score: 20    End of Session Equipment Utilized During Treatment: Gait belt Activity Tolerance: Patient tolerated treatment well;Patient limited by pain Patient left: in bed;with call bell/phone within reach;with family/visitor present (sitting EOB) Nurse Communication: Mobility status PT Visit Diagnosis: Unsteadiness on feet (R26.81);Muscle weakness (generalized) (M62.81);Difficulty in walking, not elsewhere classified (R26.2)     Time: 4403-4742 PT Time Calculation (min) (ACUTE ONLY): 30 min  Charges:  $Gait Training: 23-37 mins                     Karma Ganja, PT, DPT   Acute Rehabilitation Department Pager #: [phone]   Otho Bellows 09/14/2020, 4:45 PM

## 2020-09-14 NOTE — TOC Transition Note (Signed)
Transition of Care [facility]) - CM/SW Discharge Note   Patient Details  Name: RJ PEDROSA MRN: [id number] Date of Birth: June 02, 1960  [facility]) CM/SW Contact:  Pollie Friar, RN Phone Number: 09/14/2020, 3:39 PM   Clinical Narrative:    Patient is discharging home with home health services through [place].  Walker for home to be delivered to the room per [employer]. Pt has transport home.   Final next level of care: Home w Home Health Services Barriers to Discharge: No Barriers Identified   Patient Goals and CMS Choice   CMS Medicare.gov Compare Post Acute Care list provided to:: Patient Choice offered to / list presented to : Patient  Discharge Placement                       Discharge Plan and Services   Discharge Planning Services: CM Consult Post Acute Care Choice: Home Health,Durable Medical Equipment          DME Arranged: [place] rolling DME Agency: [employer] Date DME Agency Contacted: 09/14/20   Representative spoke with at DME Agency: [place]: PT,OT [place] Agency: [place] Date [place]: 09/13/20   Representative spoke with at [place]: [place] ([place]) Interventions     Readmission Risk Interventions No flowsheet data found.

## 2020-09-14 NOTE — Discharge Summary (Signed)
Physician Discharge Summary  Keith Dixon [id number] DOB: [birth year] DOA: 09/08/2020  PCP: Verline Lema, MD  Admit date: 09/08/2020 Discharge date: 09/14/2020  Time spent: 50* minutes  Recommendations for Outpatient Follow-up:  1. Follow-up infectious disease in 2 weeks, appointment on 09/27/2020   Discharge Diagnoses:  Active Problems:   Seizure disorder (HCC)   Bipolar disorder (HCC)   PTSD (post-traumatic stress disorder)   Hypertension   Smoker   Febrile illness   Status epilepticus ([place])   SIRS (systemic inflammatory response syndrome) ([place])   Abnormal MRI   Discharge Condition: Stable  Diet recommendation: Heart healthy diet  Filed Weights   09/08/20 1133  Weight: 113.8 kg    History of present illness:  60 year old male with a history of seizures, PTSD, hypertension, bipolar disorder came to ED on 4/30 with seizures as well as encephalopathy.  Per report patient had fever, was lethargic and had few seizures at home.  Patient reports compliance with seizure medications.  EMS witnessed 3 seizures and was given IM Versed on route.  LP was attempted in the ED and failed.  He was placed on broad-spectrum antibiotic for fever.  CSF bacterial culture came back negative, low index of suspicion for bacterial meningitis.  IV antibiotics were discontinued on 5/422.  CSF HSV 1/2 PCR negative, IV segregate was discontinued on 5/5.  Hospital Course:  Seizures-patient presented with seizures in the setting of fever at home.  He was placed on IV acyclovir and IV antibiotics.  Fever resolved.  Neurology saw the patient, initially there was question that patient was not taking his medications however patient and his wife at bedside told me that he was taking Lamictal 200 mg daily in the morning and was skipping the 300 mg dose at bedtime.  At this time neurology recommended to change Lamictal to 100 mg p.o. twice daily and follow-up with neurology at [facility].  SARS-patient was febrile on  admission, tachycardic.  Initial work-up was unremarkable.  Chest x-ray showed no findings.  COVID was negative.  UA showed few bacteria and trace leukocytes.  Blood cultures were negative to date.  He underwent an LP on 5/2 with WBC 8, RBC 455, glucose 53, total protein greater than 600, xanthochromic.Marland Kitchen  CSF cryptococcal antigen was negative.  CSF bacterial culture remains negative to date.  Infectious disease saw the patient.  Due to xanthochromia, MRI of the thoracic and lumbar spine was done.  MRI of thoracic and lumbar spine showed prominent marrow edema at L2-3 favored degenerative there is a right psoas edema and superinfection could be clinically considered. ID recommended to stop antibiotics, ID will follow patient as outpatient.  He has an appointment to see them on 09/27/2020 at 4 PM.  CSF xanthochromia-xanthochromia markedly weighted protein was found on CSF with mild elevated CSF white cells.  No evidence of SAH on MRI brain or CT head.  No aneurysm seen on CTA head but there was concern for spinal hemorrhage and hence patient underwent MRI of cervical spine thoracic and lumbar spine which are negative for hemorrhage.  Acute metabolic encephalopathy-resolved, in the setting of fever, seizures, postictal state.  Tobacco use-continue nicotine patch.  Hypertension-continue Norvasc, HCTZ/ACE inhibitor.  PTSD/bipolar disorder-continue home medications.  Thrombocytopenia-resolved  Chronic low back pain/lumbar spinal stenosis-consider outpatient neurosurgery/spine surgery evaluation.   Procedures:  Lumbar puncture  Consultations:  Infectious disease  Neurology  Discharge Exam: Vitals:   09/14/20 0906 09/14/20 1136  BP: (!) 154/96 (!) 162/96  Pulse:  81  Resp:    Temp:  98.7 F (37.1 C)  SpO2:  96%    General: Appears in no acute distress Cardiovascular: S1-S2, regular Respiratory: Clear to auscultation bilaterally  Discharge Instructions   Discharge Instructions     Diet - low sodium heart healthy   Complete by: As directed    Increase activity slowly   Complete by: As directed      Allergies as of 09/14/2020   No Known Allergies     Medication List    TAKE these medications   acetaminophen 500 MG tablet Commonly known as: TYLENOL Take 2 tablets (1,000 mg total) by mouth every 6 (six) hours as needed for mild pain.   amLODipine 10 MG tablet Commonly known as: NORVASC Take 10 mg by mouth daily.   baclofen 10 MG tablet Commonly known as: LIORESAL Take 10 mg by mouth 2 (two) times daily as needed for muscle spasms.   gabapentin 300 MG capsule Commonly known as: NEURONTIN Take 600 mg by mouth in the morning.   hydrOXYzine 50 MG capsule Commonly known as: VISTARIL Take 50 mg by mouth in the morning.   lamoTRIgine 200 MG tablet Commonly known as: LAMICTAL Take 200 mg by mouth in the morning.   levETIRAcetam 750 MG tablet Commonly known as: KEPPRA Take 1,500 mg by mouth in the morning.   lisinopril-hydrochlorothiazide 20-12.5 MG tablet Commonly known as: ZESTORETIC Take 1 tablet by mouth daily.   meloxicam 15 MG tablet Commonly known as: MOBIC Take 15 mg by mouth daily after breakfast.   QUEtiapine 200 MG tablet Commonly known as: SEROQUEL Take 200 mg by mouth at bedtime.   sertraline 100 MG tablet Commonly known as: ZOLOFT Take 100 mg by mouth daily after breakfast.   Vitamin D3 50 MCG (2000 UT) Tabs Take 2,000 Units by mouth daily. What changed: Another medication with the same name was removed. Continue taking this medication, and follow the directions you see here.            Durable Medical Equipment  (From admission, onward)         Start     Ordered   09/13/20 1152  For home use only DME Walker rolling  Once       Question Answer Comment  Walker: With 5 Inch Wheels   Patient needs a walker to treat with the following condition Weakness      09/13/20 1151         No Known Allergies    The results  of significant diagnostics from this hospitalization (including imaging, microbiology, ancillary and laboratory) are listed below for reference.    Significant Diagnostic Studies: CT ANGIO HEAD W OR WO CONTRAST  Result Date: 09/11/2020 CLINICAL DATA:  Subdural hematoma EXAM: CT ANGIOGRAPHY HEAD TECHNIQUE: Multidetector CT imaging of the head was performed using the standard protocol during bolus administration of intravenous contrast. Multiplanar CT image reconstructions and MIPs were obtained to evaluate the vascular anatomy. CONTRAST:  59mL OMNIPAQUE IOHEXOL 350 MG/ML SOLN COMPARISON:  Head CT 09/08/2020 FINDINGS: CT HEAD Brain: There is no mass, hemorrhage or extra-axial collection. The size and configuration of the ventricles and extra-axial CSF spaces are normal. The brain parenchyma is normal, without acute or chronic infarction. Vascular: No abnormal hyperdensity of the major intracranial arteries or dural venous sinuses. No intracranial atherosclerosis. Skull: Low-density lesion of the right supraorbital scalp measures 2.3 cm. Sinuses/Orbits: No fluid levels or advanced mucosal thickening of the visualized paranasal sinuses. No mastoid or  middle ear effusion. The orbits are normal. CTA HEAD POSTERIOR CIRCULATION: --Vertebral arteries: Normal --Inferior cerebellar arteries: Normal. --Basilar artery: Normal. --Superior cerebellar arteries: Normal. --Posterior cerebral arteries: Normal. ANTERIOR CIRCULATION: --Intracranial internal carotid arteries: Normal. --Anterior cerebral arteries (ACA): Normal. --Middle cerebral arteries (MCA): Normal. ANATOMIC VARIANTS: None Review of the MIP images confirms the above findings. IMPRESSION: 1. No intracranial arterial occlusion or high-grade stenosis. No aneurysm or vascular lesion. 2. No intracranial hemorrhage. Electronically Signed   By: Ulyses Jarred M.D.   On: 09/11/2020 21:04   CT Head Wo Contrast  Result Date: 09/08/2020 CLINICAL DATA:  Mental status  change.  Seizure. EXAM: CT HEAD WITHOUT CONTRAST TECHNIQUE: Contiguous axial images were obtained from the base of the skull through the vertex without intravenous contrast. COMPARISON:  Head CT dated 04/23/2019 FINDINGS: Brain: Ventricles are stable in size and configuration. No mass, hemorrhage, edema or other evidence of acute parenchymal abnormality. No extra-axial hemorrhage. Vascular: Chronic calcified atherosclerotic changes of the large vessels at the skull base. No unexpected hyperdense vessel. Skull: Normal. Negative for fracture or focal lesion. Sinuses/Orbits: No acute finding. Other: Low-density mass within the subcutaneous soft tissues lateral to the RIGHT orbit, presumed sebaceous cyst. IMPRESSION: No acute findings. No intracranial mass, hemorrhage or edema. Electronically Signed   By: Franki Cabot M.D.   On: 09/08/2020 15:13   MR BRAIN W WO CONTRAST  Result Date: 09/11/2020 CLINICAL DATA:  Abnormal lumbar puncture EXAM: MRI HEAD WITHOUT AND WITH CONTRAST TECHNIQUE: Multiplanar, multiecho pulse sequences of the brain and surrounding structures were obtained without and with intravenous contrast. CONTRAST:  71mL GADAVIST GADOBUTROL 1 MMOL/ML IV SOLN COMPARISON:  None. FINDINGS: Brain: No acute infarct, mass effect or extra-axial collection. No acute or chronic hemorrhage. There is multifocal hyperintense T2-weighted signal within the white matter. Parenchymal volume and CSF spaces are normal. The midline structures are normal. Vascular: Major flow voids are preserved. Skull and upper cervical spine: Right scalp lesion shows diffusion restriction consistent with epidermoid. Normal calvarium. Sinuses/Orbits:No paranasal sinus fluid levels or advanced mucosal thickening. No mastoid or middle ear effusion. Normal orbits. IMPRESSION: 1. No acute intracranial abnormality. No acute or chronic hemorrhage. 2. Multifocal hyperintense T2-weighted signal within the white matter, nonspecific but may be seen in  the setting of chronic microvascular ischemia. 3. Right scalp lesion consistent with epidermoid. Electronically Signed   By: Ulyses Jarred M.D.   On: 09/11/2020 22:52   MR CERVICAL SPINE WO CONTRAST  Result Date: 09/13/2020 CLINICAL DATA:  Myelopathy, acute or progressive EXAM: MRI CERVICAL, THORACIC AND LUMBAR SPINE WITHOUT CONTRAST TECHNIQUE: Multiplanar and multiecho pulse sequences of the cervical spine, to include the craniocervical junction and cervicothoracic junction, and thoracic and lumbar spine, were obtained without intravenous contrast. COMPARISON:  None. FINDINGS: MRI CERVICAL SPINE FINDINGS Alignment: Degenerative reversal of cervical lordosis with C3-4 and C4-5 anterolisthesis. C3-4 anterolisthesis measures 3 mm. Vertebrae: No fracture, evidence of discitis, or bone lesion. Cord: Normal signal and morphology. Posterior Fossa, vertebral arteries, paraspinal tissues: Negative. Disc levels: C2-3: Mild facet spurring.  Small central disc protrusion. C3-4: Disc narrowing and endplate degeneration with bulging and ridging eccentric to the left there is also greater facet osteoarthritis and spurring. Spinal stenosis with mild ventral cord flattening. Advanced left foraminal impingement C4-5: Disc narrowing and mild bulging. Asymmetric left uncovertebral ridging and left facet spurring. Advanced left foraminal impingement C5-6: Disc narrowing and bulging with endplate and uncovertebral ridging. No cord compression. Advanced biforaminal impingement C6-7: Mild leftward disc bulging and facet spurring.  Mild left foraminal stenosis based on gradient images. C7-T1:Unremarkable. MRI THORACIC SPINE FINDINGS Alignment:  Negative for listhesis. Vertebrae: No fracture, evidence of discitis, or bone lesion. Cord:  Normal signal when allowing for levels of motion artifact Paraspinal and other soft tissues: No perispinal mass or inflammation Disc levels: Generalized disc space narrowing and bulging with small disc  protrusions at essentially every thoracic level, the largest being at T5-6. Facet spurring most notable on the left inferiorly with spurs causing left foraminal impingement to a high-grade at T9-10 and T11-12. MRI LUMBAR SPINE FINDINGS Segmentation: When numbered from above there is a transitional S1 vertebra. Alignment:  Mild levoscoliosis Vertebrae: Edematous signal about the disc spaces of L2-3 and L3-4. Facet marrow edema asymmetric to the right at L2-3. Conus medullaris and cauda equina: Conus extends to the tip terminates at L1-2 level. No conus edema is seen. There is cauda equina redundancy due to the degree of severe spinal stenosis. Paraspinal and other soft tissues: Partial coverage of the bladder shows distension with trabeculated wall thickening. Disc levels: L1-L2: Disc narrowing and bulging with endplate ridging eccentric to the right. Asymmetric right facet spurring. Moderate to advanced right foraminal impingement. Mild spinal stenosis mainly due to epidural fat L2-L3: Right-sided gas and fluid within a large fissure with discogenic marrow edema, overall degenerative appearing although there is some edema signal seen in the right psoas. A large herniation is seen extending superiorly and leftward with severe spinal stenosis. Facet spurring on the right more than left with advanced right foraminal impingement L3-L4: Disc collapse with gas and fluid in a large fissure. The annulus is circumferentialyy of bulging with buttressing osteophytes. Facet spurring on the right more than left. Severe spinal and right foraminal stenosis. Moderate left foraminal impingement L4-L5: Disc narrowing and bulging with gas and fluid containing fissure eccentric to the left. Degenerative facet spurring on the left more than right with severe left foraminal impingement and advanced spinal stenosis L5-S1:Disc narrowing and bulging with annular fissures. Degenerative facet spurring. Patent spinal canal. Severe left and  moderate right foraminal impingement. IMPRESSION: Lumbar spine: 1. 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 2. Severe degenerative spinal stenosis at L2-3 and L3-4. L4-5 high-grade spinal stenosis. At L2-3 the stenosis is associated with a large superiorly migrating disc extrusion. 3. Prominent marrow edema at L2-3 is favored degenerative but there is right psoas edema and super infection should be clinically considered. 4. Severe foraminal narrowings listed above. 5. Partial coverage of the bladder shows over distension. Cervical MRI: 1. 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 2. No cord compression or edema to explain the history. Up to mild spinal stenosis at C3-4. 3. Advanced foraminal impingement on the left at C3-4 to C5-6 and on the right at C5-6. Thoracic MRI: 1. Diffuse degenerative disease. No cord edema or compression to explain the history. 2. Left foraminal impingement at T9-10 and T11-12, mainly facet mediated. Electronically Signed   By: [name] M.D.   On: 09/13/2020 08:52   MR THORACIC SPINE WO CONTRAST  Result Date: 09/13/2020 CLINICAL DATA:  Myelopathy, acute or progressive EXAM: MRI CERVICAL, THORACIC AND LUMBAR SPINE WITHOUT CONTRAST TECHNIQUE: Multiplanar and multiecho pulse sequences of the cervical spine, to include the craniocervical junction and cervicothoracic junction, and thoracic and lumbar spine, were obtained without intravenous contrast. COMPARISON:  None. FINDINGS: MRI CERVICAL SPINE FINDINGS Alignment: Degenerative reversal of cervical lordosis with C3-4 and C4-5 anterolisthesis. C3-4 anterolisthesis measures 3 mm. Vertebrae: No fracture, evidence of discitis,  or bone lesion. Cord: Normal signal and morphology. Posterior Fossa, vertebral arteries, paraspinal tissues: Negative. Disc levels: C2-3: Mild facet spurring.  Small central disc protrusion. C3-4: Disc narrowing and endplate degeneration with bulging and ridging eccentric to the  left there is also greater facet osteoarthritis and spurring. Spinal stenosis with mild ventral cord flattening. Advanced left foraminal impingement C4-5: Disc narrowing and mild bulging. Asymmetric left uncovertebral ridging and left facet spurring. Advanced left foraminal impingement C5-6: Disc narrowing and bulging with endplate and uncovertebral ridging. No cord compression. Advanced biforaminal impingement C6-7: Mild leftward disc bulging and facet spurring. Mild left foraminal stenosis based on gradient images. C7-T1:Unremarkable. MRI THORACIC SPINE FINDINGS Alignment:  Negative for listhesis. Vertebrae: No fracture, evidence of discitis, or bone lesion. Cord:  Normal signal when allowing for levels of motion artifact Paraspinal and other soft tissues: No perispinal mass or inflammation Disc levels: Generalized disc space narrowing and bulging with small disc protrusions at essentially every thoracic level, the largest being at T5-6. Facet spurring most notable on the left inferiorly with spurs causing left foraminal impingement to a high-grade at T9-10 and T11-12. MRI LUMBAR SPINE FINDINGS Segmentation: When numbered from above there is a transitional S1 vertebra. Alignment:  Mild levoscoliosis Vertebrae: Edematous signal about the disc spaces of L2-3 and L3-4. Facet marrow edema asymmetric to the right at L2-3. Conus medullaris and cauda equina: Conus extends to the tip terminates at L1-2 level. No conus edema is seen. There is cauda equina redundancy due to the degree of severe spinal stenosis. Paraspinal and other soft tissues: Partial coverage of the bladder shows distension with trabeculated wall thickening. Disc levels: L1-L2: Disc narrowing and bulging with endplate ridging eccentric to the right. Asymmetric right facet spurring. Moderate to advanced right foraminal impingement. Mild spinal stenosis mainly due to epidural fat L2-L3: Right-sided gas and fluid within a large fissure with discogenic  marrow edema, overall degenerative appearing although there is some edema signal seen in the right psoas. A large herniation is seen extending superiorly and leftward with severe spinal stenosis. Facet spurring on the right more than left with advanced right foraminal impingement L3-L4: Disc collapse with gas and fluid in a large fissure. The annulus is circumferentialyy of bulging with buttressing osteophytes. Facet spurring on the right more than left. Severe spinal and right foraminal stenosis. Moderate left foraminal impingement L4-L5: Disc narrowing and bulging with gas and fluid containing fissure eccentric to the left. Degenerative facet spurring on the left more than right with severe left foraminal impingement and advanced spinal stenosis L5-S1:Disc narrowing and bulging with annular fissures. Degenerative facet spurring. Patent spinal canal. Severe left and moderate right foraminal impingement. IMPRESSION: Lumbar spine: 1. 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 2. Severe degenerative spinal stenosis at L2-3 and L3-4. L4-5 high-grade spinal stenosis. At L2-3 the stenosis is associated with a large superiorly migrating disc extrusion. 3. Prominent marrow edema at L2-3 is favored degenerative but there is right psoas edema and super infection should be clinically considered. 4. Severe foraminal narrowings listed above. 5. Partial coverage of the bladder shows over distension. Cervical MRI: 1. 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 2. No cord compression or edema to explain the history. Up to mild spinal stenosis at C3-4. 3. Advanced foraminal impingement on the left at C3-4 to C5-6 and on the right at C5-6. Thoracic MRI: 1. Diffuse degenerative disease. No cord edema or compression to explain the history. 2. Left foraminal impingement at T9-10 and T11-12, mainly  facet mediated. Electronically Signed   By: [name] M.D.   On: 09/13/2020 08:52   MR LUMBAR SPINE  WO CONTRAST  Result Date: 09/13/2020 CLINICAL DATA:  Myelopathy, acute or progressive EXAM: MRI CERVICAL, THORACIC AND LUMBAR SPINE WITHOUT CONTRAST TECHNIQUE: Multiplanar and multiecho pulse sequences of the cervical spine, to include the craniocervical junction and cervicothoracic junction, and thoracic and lumbar spine, were obtained without intravenous contrast. COMPARISON:  None. FINDINGS: MRI CERVICAL SPINE FINDINGS Alignment: Degenerative reversal of cervical lordosis with C3-4 and C4-5 anterolisthesis. C3-4 anterolisthesis measures 3 mm. Vertebrae: No fracture, evidence of discitis, or bone lesion. Cord: Normal signal and morphology. Posterior Fossa, vertebral arteries, paraspinal tissues: Negative. Disc levels: C2-3: Mild facet spurring.  Small central disc protrusion. C3-4: Disc narrowing and endplate degeneration with bulging and ridging eccentric to the left there is also greater facet osteoarthritis and spurring. Spinal stenosis with mild ventral cord flattening. Advanced left foraminal impingement C4-5: Disc narrowing and mild bulging. Asymmetric left uncovertebral ridging and left facet spurring. Advanced left foraminal impingement C5-6: Disc narrowing and bulging with endplate and uncovertebral ridging. No cord compression. Advanced biforaminal impingement C6-7: Mild leftward disc bulging and facet spurring. Mild left foraminal stenosis based on gradient images. C7-T1:Unremarkable. MRI THORACIC SPINE FINDINGS Alignment:  Negative for listhesis. Vertebrae: No fracture, evidence of discitis, or bone lesion. Cord:  Normal signal when allowing for levels of motion artifact Paraspinal and other soft tissues: No perispinal mass or inflammation Disc levels: Generalized disc space narrowing and bulging with small disc protrusions at essentially every thoracic level, the largest being at T5-6. Facet spurring most notable on the left inferiorly with spurs causing left foraminal impingement to a high-grade at  T9-10 and T11-12. MRI LUMBAR SPINE FINDINGS Segmentation: When numbered from above there is a transitional S1 vertebra. Alignment:  Mild levoscoliosis Vertebrae: Edematous signal about the disc spaces of L2-3 and L3-4. Facet marrow edema asymmetric to the right at L2-3. Conus medullaris and cauda equina: Conus extends to the tip terminates at L1-2 level. No conus edema is seen. There is cauda equina redundancy due to the degree of severe spinal stenosis. Paraspinal and other soft tissues: Partial coverage of the bladder shows distension with trabeculated wall thickening. Disc levels: L1-L2: Disc narrowing and bulging with endplate ridging eccentric to the right. Asymmetric right facet spurring. Moderate to advanced right foraminal impingement. Mild spinal stenosis mainly due to epidural fat L2-L3: Right-sided gas and fluid within a large fissure with discogenic marrow edema, overall degenerative appearing although there is some edema signal seen in the right psoas. A large herniation is seen extending superiorly and leftward with severe spinal stenosis. Facet spurring on the right more than left with advanced right foraminal impingement L3-L4: Disc collapse with gas and fluid in a large fissure. The annulus is circumferentialyy of bulging with buttressing osteophytes. Facet spurring on the right more than left. Severe spinal and right foraminal stenosis. Moderate left foraminal impingement L4-L5: Disc narrowing and bulging with gas and fluid containing fissure eccentric to the left. Degenerative facet spurring on the left more than right with severe left foraminal impingement and advanced spinal stenosis L5-S1:Disc narrowing and bulging with annular fissures. Degenerative facet spurring. Patent spinal canal. Severe left and moderate right foraminal impingement. IMPRESSION: Lumbar spine: 1. 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 2. Severe degenerative spinal stenosis at L2-3 and L3-4. L4-5 high-grade  spinal stenosis. At L2-3 the stenosis is associated with a large superiorly migrating disc extrusion. 3. Prominent marrow edema  at L2-3 is favored degenerative but there is right psoas edema and super infection should be clinically considered. 4. Severe foraminal narrowings listed above. 5. Partial coverage of the bladder shows over distension. Cervical MRI: 1. 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 2. No cord compression or edema to explain the history. Up to mild spinal stenosis at C3-4. 3. Advanced foraminal impingement on the left at C3-4 to C5-6 and on the right at C5-6. Thoracic MRI: 1. Diffuse degenerative disease. No cord edema or compression to explain the history. 2. Left foraminal impingement at T9-10 and T11-12, mainly facet mediated. Electronically Signed   By: [name] M.D.   On: 09/13/2020 08:52   DG Chest Port 1 View  Result Date: 09/08/2020 CLINICAL DATA:  Sepsis EXAM: PORTABLE CHEST 1 VIEW COMPARISON:  04/25/2011 FINDINGS: Low lung volumes. No focal consolidation. No pleural effusion or pneumothorax. Heart and mediastinal contours are unremarkable. No acute osseous abnormality. IMPRESSION: No active disease. Electronically Signed   By: Kathreen Devoid   On: 09/08/2020 12:39   EEG adult  Result Date: 09/09/2020 Lora Havens, MD     09/09/2020 10:51 AM Patient Name: Keith Dixon MRN: [id number] Epilepsy Attending: Lora Havens Referring Physician/Provider: Dr Su Monks Date: 09/09/2020 Duration: 25.30 mins Patient history: 60 year old man with a history of epilepsy, bipolar d/o, PTSD, HTN who presents with 3 breakthrough seizures today in the setting of possible sepsis. EEG to evaluate for seizure. Level of alertness: Awake,asleep AEDs during EEG study: LEV, LTG, GBP Technical aspects: This EEG study was done with scalp electrodes positioned according to the 10-20 International system of electrode placement. Electrical activity was acquired at a  sampling rate of 500Hz  and reviewed with a high frequency filter of 70Hz  and a low frequency filter of 1Hz . EEG data were recorded continuously and digitally stored. Description: The posterior dominant rhythm consists of 8 Hz activity of moderate voltage (25-35 uV) seen predominantly in posterior head regions, symmetric and reactive to eye opening and eye closing. Sleep was characterized by vertex waves, sleep spindles (12 to 14 Hz), maximal frontocentral region. Single sharp transient was noted in left frontal region. Hyperventilation and photic stimulation were not performed.   IMPRESSION: This study is within normal limits. No seizures or definite epileptiform discharges were seen throughout the recording. Priyanka Barbra Sarks   Overnight EEG with video  Result Date: 09/10/2020 Lora Havens, MD     09/10/2020  1:40 PM Patient Name: Keith Dixon MRN: [id number] Epilepsy Attending: Lora Havens Referring Physician/Provider: Dr Su Monks Duration: 09/09/2020 0942 to 09/10/2020 1129  Patient history: 60 year old man with a history of epilepsy, bipolar d/o, PTSD, HTN who presents with 3 breakthrough seizures today in the setting of possible sepsis. EEG to evaluate for seizure.  Level of alertness: Awake,asleep  AEDs during EEG study: LEV, GBP  Technical aspects: This EEG study was done with scalp electrodes positioned according to the 10-20 International system of electrode placement. Electrical activity was acquired at a sampling rate of 500Hz  and reviewed with a high frequency filter of 70Hz  and a low frequency filter of 1Hz . EEG data were recorded continuously and digitally stored.  Description: The posterior dominant rhythm consists of 8 Hz activity of moderate voltage (25-35 uV) seen predominantly in posterior head regions, symmetric and reactive to eye opening and eye closing. Sleep was characterized by vertex waves, sleep spindles (12 to 14 Hz), maximal frontocentral region.  Small sharp spikes  were noted in  her right fronto-temporal region. Hyperventilation and photic stimulation were not performed.    IMPRESSION: This study is within normal limits. No seizures or definite epileptiform discharges were seen throughout the recording.   [place] GUIDED LUMBAR PUNCTURE  Result Date: 09/10/2020 CLINICAL DATA:  Seizures.  Sepsis. EXAM: DIAGNOSTIC LUMBAR PUNCTURE UNDER FLUOROSCOPIC GUIDANCE COMPARISON:  Lumbar spine CT from 04/23/2019 was reviewed. FLUOROSCOPY TIME:  Fluoroscopy Time:  0 minutes and 48 seconds. Radiation Exposure Index (if provided by the fluoroscopic device): 55.4 mGy Number of Acquired Spot Images: PROCEDURE: Written informed consent was obtained from the patient prior to the procedure, including potential complications of bleeding, infection, inability to obtain CSF, and headache. Patient voiced understanding and he and his daughter were provided opportunity to ask questions. Prior to starting the procedure, a formal time-out was performed. Using fluoroscopic guidance, appropriate skin sites over the L5-S1 level (patient noted at transitional anatomy) and L2-3 level were identified as thecal sac was markedly narrow at other levels. With the patient prone, the lower back was prepped and draped in the usual sterile fashion. 1% Lidocaine was used for local anesthesia. Lumbar puncture was performed at the L5-S1 level using a 20 gauge needle with return of yellow tinged CSF with an opening pressure of 23-24 cm water. CSF space was accessed on the first pass with brisk return. After approximately 5-6 cc of CSF was obtained, the spinal needle and tubing filled with blood and no further CSF could be obtained despite multiple repeated attempts at needle repositioning. This was discussed with the patient and as he was comfortable, he agreed to allow an additional punctur at the L2-3 level in an attempt to obtain additional CSF for laboratory analysis. Multiple attempts at the L2-3  level did not obtain additional CSF. IMPRESSION: Successful fluoro guided lumbar puncture at the L5-S1 level although only about 5-6 cc of CSF was obtained. I discussed the limited volume obtained with Dr. Cruzita Lederer immediately after the procedure was completed. Patient tolerated the procedure well without evidence for immediate complication. Electronically Signed   By: Misty Stanley M.D.   On: 09/10/2020 15:27    Microbiology: Recent Results (from the past 240 hour(s))  Resp Panel by RT-PCR (Flu A&B, Covid) Nasopharyngeal Swab     Status: None   Collection Time: 09/08/20 11:36 AM   Specimen: Nasopharyngeal Swab; Nasopharyngeal(NP) swabs in vial transport medium  Result Value Ref Range Status   SARS Coronavirus 2 by RT PCR NEGATIVE NEGATIVE Final    Comment: (NOTE) SARS-CoV-2 target nucleic acids are NOT DETECTED.  The SARS-CoV-2 RNA is generally detectable in upper respiratory specimens during the acute phase of infection. The lowest concentration of SARS-CoV-2 viral copies this assay can detect is 138 copies/mL. A negative result does not preclude SARS-Cov-2 infection and should not be used as the sole basis for treatment or other patient management decisions. A negative result may occur with  improper specimen collection/handling, submission of specimen other than nasopharyngeal swab, presence of viral mutation(s) within the areas targeted by this assay, and inadequate number of viral copies(<138 copies/mL). A negative result must be combined with clinical observations, patient history, and epidemiological information. The expected result is Negative.  Fact Sheet for Patients:  EntrepreneurPulse.com.au  Fact Sheet for Healthcare Providers:  IncredibleEmployment.be  This test is no t yet approved or cleared by the [place] FDA and  has been authorized for detection and/or diagnosis of SARS-CoV-2 by FDA under an Emergency Use Authorization  (  EUA). This EUA will remain  in effect (meaning this test can be used) for the duration of the COVID-19 declaration under Section 564(b)(1) of the Act, 21 U.S.C.section 360bbb-3(b)(1), unless the authorization is terminated  or revoked sooner.       Influenza A by PCR NEGATIVE NEGATIVE Final   Influenza B by PCR NEGATIVE NEGATIVE Final    Comment: (NOTE) The Xpert Xpress SARS-CoV-2/FLU/RSV plus assay is intended as an aid in the diagnosis of influenza from Nasopharyngeal swab specimens and should not be used as a sole basis for treatment. Nasal washings and aspirates are unacceptable for Xpert Xpress SARS-CoV-2/FLU/RSV testing.  Fact Sheet for Patients: EntrepreneurPulse.com.au  Fact Sheet for Healthcare Providers: IncredibleEmployment.be  This test is not yet approved or cleared by the [place] FDA and has been authorized for detection and/or diagnosis of SARS-CoV-2 by FDA under an Emergency Use Authorization (EUA). This EUA will remain in effect (meaning this test can be used) for the duration of the COVID-19 declaration under Section 564(b)(1) of the Act, 21 U.S.C. section 360bbb-3(b)(1), unless the authorization is terminated or revoked.  Performed at [facility], [place] [address], [place], [place] [address]   Blood Culture (routine x 2)     Status: None   Collection Time: 09/08/20 11:36 AM   Specimen: BLOOD RIGHT HAND  Result Value Ref Range Status   Specimen Description BLOOD RIGHT HAND  Final   Special Requests   Final    BOTTLES DRAWN AEROBIC AND ANAEROBIC Blood Culture adequate volume   Culture   Final    NO GROWTH 5 DAYS Performed at [facility], [place] [address], [place], [place] [address]    Report Status 09/13/2020 FINAL  Final  Blood Culture (routine x 2)     Status: None   Collection Time: 09/08/20 11:41 AM   Specimen: BLOOD RIGHT FOREARM  Result Value Ref Range Status   Specimen Description BLOOD RIGHT  FOREARM  Final   Special Requests   Final    BOTTLES DRAWN AEROBIC AND ANAEROBIC Blood Culture adequate volume   Culture   Final    NO GROWTH 5 DAYS Performed at [facility], [place] [address], [place], [place] [address]    Report Status 09/13/2020 FINAL  Final  Urine culture     Status: None   Collection Time: 09/08/20  3:42 PM   Specimen: In/Out Cath Urine  Result Value Ref Range Status   Specimen Description IN/OUT CATH URINE  Final   Special Requests NONE  Final   Culture   Final    NO GROWTH Performed at [facility], [place] [address] [place], [place] [address]    Report Status 09/09/2020 FINAL  Final  CSF culture w Gram Stain     Status: None   Collection Time: 09/10/20  2:18 PM   Specimen: PATH Cytology CSF; Cerebrospinal Fluid  Result Value Ref Range Status   Specimen Description CSF  Final   Special Requests NONE  Final   Gram Stain   Final    WBC PRESENT,BOTH PMN AND MONONUCLEAR NO ORGANISMS SEEN CYTOSPIN SMEAR    Culture   Final    NO GROWTH 3 DAYS Performed at [facility], [place] [address], [place], [place] [address]    Report Status 09/14/2020 FINAL  Final     Labs: Basic Metabolic Panel: Recent Labs  Lab 09/08/20 2120 09/09/20 [id number] 09/10/20 0330 09/11/20 0445 09/12/20 0353 09/13/20 0428 09/14/20  0323  NA  --  135 139 138 139 138 136  K  --  3.2* 3.3* 4.2 3.8 4.2 4.0  CL  --  103 108 109 111 105 104  CO2  --  20* 23 19* 26 25 24   GLUCOSE  --  84 108* 121* 114* 112* 111*  BUN  --  17 17 12 10 10 16   CREATININE  --  1.12 1.27* 1.00 0.88 0.87 0.95  CALCIUM  --  8.7* 8.8* 8.8* 8.9 9.5 9.7  MG 1.7 1.5*  --   --  1.8  --   --   PHOS  --  1.6*  --   --  3.6  --   --    Liver Function Tests: Recent Labs  Lab 09/08/20 1136 09/10/20 0330  AST 50* 25  ALT 28 21  ALKPHOS 74 57  BILITOT 2.3* 0.8  PROT 6.7 5.6*  ALBUMIN 4.0 2.9*   No results for input(s): LIPASE, AMYLASE in the last 168 hours. No results for input(s): AMMONIA in the  last 168 hours. CBC: Recent Labs  Lab 09/08/20 1136 09/08/20 1208 09/09/20 0209 09/10/20 0330 09/13/20 0428  WBC 8.3  --  10.2 9.9 5.5  NEUTROABS 7.8*  --   --   --   --   HGB 13.5 14.3 12.3* 11.7* 12.4*  HCT 41.6 42.0 36.9* 35.2* 38.7*  MCV 83.0  --  81.3 82.8 82.9  PLT 176  --  102* 107* 190   Cardiac Enzymes: Recent Labs  Lab 09/08/20 2120  CKTOTAL 852*   BNP: BNP (last 3 results) No results for input(s): BNP in the last 8760 hours.  ProBNP (last 3 results) No results for input(s): PROBNP in the last 8760 hours.  CBG: Recent Labs  Lab 09/08/20 1129  GLUCAP 121*       Signed:  Oswald Hillock MD.  [facility] 09/14/2020, 3:18 PM

## 2020-09-14 NOTE — Consult Note (Signed)
[facility] for Infectious Disease    Date of Admission:  09/08/2020     Reason for Consult: abnormal mri spine/paraspinal muscle finding    Referring Provider: Darrick Meigs     Lines:  Peripheral iv's  Abx: 4/30-5/05 acyclovir 4/30-5/04 vanc/cefepime        Assessment/plan: L2-3 mri suggestion vertebral om Psoas myositis  60 yo male initially admitted with sepsis on 4/30 with concern meningoencephalitis but negative mri brain/csf evaluation for hsv or bacterial process (only 8 wbc and elevated protein) protein but subsequently with mri finding of L2-3 vertebral body edema and psoas myositis  Due to xanthochromia, spine mri was done (as brain mri without evidence SAH). I discussed with neurology --> inflammatory state with high csf protein can show xanthochromia  I agree with stopping all abx for cns process. I am unclear what the significance of the mri finding is. Clinically his presentation along with other objective finding doesn't suggest a hematogenous bacterial vertebral OM process. While tuberculosis can explain the csf finding, just doesn't get better without anti-TB tx. Certainly viral process can cause myositis but the pattern (vertebral body involvement and paraspinal process involvement is concerning)  1. Will check inflammatory marker and follow up soon 2. At this time I would defer empiric abx 3. I will see him in clinic in [place] on 5/19 at [facility]   @  [facility] [place], [place], [place] [address] Phone: [phone]    ------------------------------------------------ Active Problems:   Seizure disorder ([place])   Bipolar disorder ([place])   PTSD (post-traumatic stress disorder)   Hypertension   Smoker   Status epilepticus ([place])   SIRS (systemic inflammatory response syndrome) ([place])    HPI: Keith Dixon is a 60 y.o. male smoker, bipolar, seizure disorder admitted on 4/30 with sepsis, seizure, encephalopathy  He had a negative  sepsis w/u in terms of culture/csf analysis, mri brain. Csf cell count did saw 8 wbc. His bsAbx were stopped  Due to presence of xanthchromia reported on csf finding, neurology obtained a spine mri which showed myositis psoas and l2-3 marrow edema for which id is called  Denies ivdu. Again bcx negative. No leukocytosis. Fever only presence on hd#1. His ams resolved within 1 day presentation. He has no uds this admission  He has no back pain, leg pain. He has been out of bed ambulating well. His appetite is good/normal  He is ready to be discharged His hiv screen is negative  No outdoor hobbies No ivdu (marijanna only) Smokes 1 ppd  Occasional etoh  Prior [employer] been to [place], [place], [place], [place] Born in [place]  No unpasteurized dairy  Prior to admission 2-3 days subjective f/c then the day of admission confusion/seizure.  Chronic diffuse lower back pain/bilateral hands/feet numbness 7-9 years no change  OA awaiting hip replacement  No personal hx cancer    Family History  Problem Relation Age of Onset  . Seizures Mother   . Breast cancer Mother 27  . Hypertension Brother     Social History   Tobacco Use  . Smoking status: Current Every Day Smoker    Packs/day: 1.00    Years: 20.00    Pack years: 20.00    Types: Cigarettes  . Smokeless tobacco: Never Used  Substance Use Topics  . Alcohol use: No    Comment: once or twice a year  . Drug use: Yes    Types: Marijuana, Cocaine  Comment: pt stated he quit 2 weeks ago 08/10/12, pt reports doing marijuana on 11/13/12    No Known Allergies  Review of Systems: ROS All Other ROS was negative, except mentioned above   Past Medical History:  Diagnosis Date  . Anxiety   . Bipolar 1 disorder, depressed ([place])   . Bipolar disorder ([place])   . Depression   . History of posttraumatic stress disorder (PTSD)   . Hypertension   . PTSD (post-traumatic stress disorder)   . Seizures ([place])   . Smoker   . Tobacco use  disorder        Scheduled Meds: . amLODipine  10 mg Oral Daily  . cholecalciferol  2,000 Units Oral Daily  . gabapentin  600 mg Oral BID  . hydrochlorothiazide  12.5 mg Oral Daily  . lamoTRIgine  25 mg Oral Daily  . levETIRAcetam  1,500 mg Oral BID  . lisinopril  20 mg Oral Daily  . nicotine  21 mg Transdermal Daily  . QUEtiapine  100 mg Oral QHS  . sertraline  100 mg Oral QPC breakfast   Continuous Infusions: PRN Meds:.acetaminophen **OR** acetaminophen, albuterol, baclofen, hydrALAZINE, hydrOXYzine, LORazepam, ondansetron **OR** ondansetron (ZOFRAN) IV   OBJECTIVE: Blood pressure (!) 154/96, pulse 79, temperature 98.3 F (36.8 C), temperature source Oral, resp. rate 20, height 6' (1.829 m), weight 113.8 kg, SpO2 100 %.  Physical Exam Constitutional:      General: He is not in acute distress.    Appearance: He is normal weight.  HENT:     Head: Normocephalic.     Mouth/Throat:     Mouth: Mucous membranes are moist.     Comments: Poor dentition Eyes:     Pupils: Pupils are equal, round, and reactive to light.  Cardiovascular:     Rate and Rhythm: Normal rate and regular rhythm.     Pulses: Normal pulses.  Pulmonary:     Effort: Pulmonary effort is normal.  Abdominal:     Palpations: Abdomen is soft.     Tenderness: There is no abdominal tenderness.  Musculoskeletal:        General: Normal range of motion.     Cervical back: Normal range of motion.  Skin:    General: Skin is warm and dry.     Findings: No rash.  Neurological:     General: No focal deficit present.     Mental Status: He is alert.     Cranial Nerves: No cranial nerve deficit.  Psychiatric:        Mood and Affect: Mood normal.        Behavior: Behavior normal.       Lab Results Lab Results  Component Value Date   WBC 5.5 09/13/2020   HGB 12.4 (L) 09/13/2020   HCT 38.7 (L) 09/13/2020   MCV 82.9 09/13/2020   PLT 190 09/13/2020    Lab Results  Component Value Date   CREATININE 0.95  09/14/2020   BUN 16 09/14/2020   NA 136 09/14/2020   K 4.0 09/14/2020   CL 104 09/14/2020   CO2 24 09/14/2020    Lab Results  Component Value Date   ALT 21 09/10/2020   AST 25 09/10/2020   ALKPHOS 57 09/10/2020   BILITOT 0.8 09/10/2020      Microbiology: Recent Results (from the past 240 hour(s))  Resp Panel by RT-PCR (Flu A&B, Covid) Nasopharyngeal Swab     Status: None   Collection Time: 09/08/20 11:36 AM   Specimen:  Nasopharyngeal Swab; Nasopharyngeal(NP) swabs in vial transport medium  Result Value Ref Range Status   SARS Coronavirus 2 by RT PCR NEGATIVE NEGATIVE Final    Comment: (NOTE) SARS-CoV-2 target nucleic acids are NOT DETECTED.  The SARS-CoV-2 RNA is generally detectable in upper respiratory specimens during the acute phase of infection. The lowest concentration of SARS-CoV-2 viral copies this assay can detect is 138 copies/mL. A negative result does not preclude SARS-Cov-2 infection and should not be used as the sole basis for treatment or other patient management decisions. A negative result may occur with  improper specimen collection/handling, submission of specimen other than nasopharyngeal swab, presence of viral mutation(s) within the areas targeted by this assay, and inadequate number of viral copies(<138 copies/mL). A negative result must be combined with clinical observations, patient history, and epidemiological information. The expected result is Negative.  Fact Sheet for Patients:  EntrepreneurPulse.com.au  Fact Sheet for Healthcare Providers:  IncredibleEmployment.be  This test is no t yet approved or cleared by the [place] FDA and  has been authorized for detection and/or diagnosis of SARS-CoV-2 by FDA under an Emergency Use Authorization (EUA). This EUA will remain  in effect (meaning this test can be used) for the duration of the COVID-19 declaration under Section 564(b)(1) of the Act,  21 U.S.C.section 360bbb-3(b)(1), unless the authorization is terminated  or revoked sooner.       Influenza A by PCR NEGATIVE NEGATIVE Final   Influenza B by PCR NEGATIVE NEGATIVE Final    Comment: (NOTE) The Xpert Xpress SARS-CoV-2/FLU/RSV plus assay is intended as an aid in the diagnosis of influenza from Nasopharyngeal swab specimens and should not be used as a sole basis for treatment. Nasal washings and aspirates are unacceptable for Xpert Xpress SARS-CoV-2/FLU/RSV testing.  Fact Sheet for Patients: EntrepreneurPulse.com.au  Fact Sheet for Healthcare Providers: IncredibleEmployment.be  This test is not yet approved or cleared by the [place] FDA and has been authorized for detection and/or diagnosis of SARS-CoV-2 by FDA under an Emergency Use Authorization (EUA). This EUA will remain in effect (meaning this test can be used) for the duration of the COVID-19 declaration under Section 564(b)(1) of the Act, 21 U.S.C. section 360bbb-3(b)(1), unless the authorization is terminated or revoked.  Performed at [facility], [place] [address], [place], [place] [address]   Blood Culture (routine x 2)     Status: None   Collection Time: 09/08/20 11:36 AM   Specimen: BLOOD RIGHT HAND  Result Value Ref Range Status   Specimen Description BLOOD RIGHT HAND  Final   Special Requests   Final    BOTTLES DRAWN AEROBIC AND ANAEROBIC Blood Culture adequate volume   Culture   Final    NO GROWTH 5 DAYS Performed at [facility], [place] [address], [place], [place] [address]    Report Status 09/13/2020 FINAL  Final  Blood Culture (routine x 2)     Status: None   Collection Time: 09/08/20 11:41 AM   Specimen: BLOOD RIGHT FOREARM  Result Value Ref Range Status   Specimen Description BLOOD RIGHT FOREARM  Final   Special Requests   Final    BOTTLES DRAWN AEROBIC AND ANAEROBIC Blood Culture adequate volume   Culture   Final    NO GROWTH 5  DAYS Performed at [facility], [place] [address] [place],  [address]    Report Status 09/13/2020 FINAL  Final  Urine culture     Status: None   Collection  Time: 09/08/20  3:42 PM   Specimen: In/Out Cath Urine  Result Value Ref Range Status   Specimen Description IN/OUT CATH URINE  Final   Special Requests NONE  Final   Culture   Final    NO GROWTH Performed at [facility], [address], [place], [place] [address]    Report Status 09/09/2020 FINAL  Final  CSF culture w Gram Stain     Status: None (Preliminary result)   Collection Time: 09/10/20  2:18 PM   Specimen: PATH Cytology CSF; Cerebrospinal Fluid  Result Value Ref Range Status   Specimen Description CSF  Final   Special Requests NONE  Final   Gram Stain   Final    WBC PRESENT,BOTH PMN AND MONONUCLEAR NO ORGANISMS SEEN CYTOSPIN SMEAR    Culture   Final    NO GROWTH 3 DAYS Performed at [facility], [place] [address], [place], [place] [address]    Report Status PENDING  Incomplete   4/30 ua >50 rbc, 6-10 wbc  Serology: Csf vzv, hsv pcr negative Serum crypto ag negative hiv screen negative Flu/covid pcr negative   Micro: 4/30 bcx negative 5/02 csf cx negative; 53 glucose, >600 protein, xanthochromia, 8 wbc  Imaging: If present, new imagings (plain films, ct scans, and mri) have been personally visualized and interpreted; radiology reports have been reviewed. Decision making incorporated into the Impression / Recommendations.      Jabier Mutton, [facility] for Infectious [place] [phone] pager    09/14/2020, 9:24 AM

## 2020-09-14 NOTE — Progress Notes (Signed)
Pt safely discharged. Discharge packet provided with teach-back method. VS as per flow and within Pt norms.. IV removed, tele dc'd. Pt verbalized understanding of AVS. All questions and concerns addressed. Significant other here for transport. Pt safely wheelchaired down to main entrance.

## 2020-09-14 NOTE — Progress Notes (Signed)
HSV1/2 PCR from CSF sample came back negative. Acyclovir has been stopped.   Patient's mentation is improved.   Neurology will sign off. Please call if there are additional questions.

## 2020-09-27 ENCOUNTER — Encounter: Payer: Self-pay | Admitting: Internal Medicine

## 2020-09-27 ENCOUNTER — Ambulatory Visit (INDEPENDENT_AMBULATORY_CARE_PROVIDER_SITE_OTHER): Payer: No Typology Code available for payment source | Admitting: Internal Medicine

## 2020-09-27 ENCOUNTER — Other Ambulatory Visit: Payer: Self-pay

## 2020-09-27 VITALS — BP 153/84 | HR 88 | Wt 230.4 lb

## 2020-09-27 DIAGNOSIS — R9389 Abnormal findings on diagnostic imaging of other specified body structures: Secondary | ICD-10-CM | POA: Diagnosis not present

## 2020-09-27 NOTE — Patient Instructions (Signed)
Will repeat cbc, cmp, crp, and mri of your lower back   Will see you in 1-2 weeks to follow up your symptoms   If crp rises and you have fever, chill, please call us as we might need to admit you to hospital and get an expedited workup of the back (biopsy if needed)
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
## 2020-09-27 NOTE — Addendum Note (Signed)
Addended by: Caffie Pinto on: 09/27/2020 05:08 PM   Modules accepted: Orders

## 2020-09-28 ENCOUNTER — Other Ambulatory Visit: Payer: No Typology Code available for payment source

## 2020-09-28 DIAGNOSIS — R9389 Abnormal findings on diagnostic imaging of other specified body structures: Secondary | ICD-10-CM

## 2020-09-29 LAB — COMPREHENSIVE METABOLIC PANEL
AG Ratio: 1.7 (calc) (ref 1.0–2.5)
ALT: 13 U/L (ref 9–46)
AST: 14 U/L (ref 10–35)
Albumin: 4.3 g/dL (ref 3.6–5.1)
Alkaline phosphatase (APISO): 73 U/L (ref 35–144)
BUN: 21 mg/dL (ref 7–25)
CO2: 26 mmol/L (ref 20–32)
Calcium: 9.7 mg/dL (ref 8.6–10.3)
Chloride: 105 mmol/L (ref 98–110)
Creat: 1.16 mg/dL (ref 0.70–1.33)
Globulin: 2.5 g/dL (calc) (ref 1.9–3.7)
Glucose, Bld: 90 mg/dL (ref 65–99)
Potassium: 4.3 mmol/L (ref 3.5–5.3)
Sodium: 140 mmol/L (ref 135–146)
Total Bilirubin: 0.3 mg/dL (ref 0.2–1.2)
Total Protein: 6.8 g/dL (ref 6.1–8.1)

## 2020-09-29 LAB — C-REACTIVE PROTEIN: CRP: 2.8 mg/L (ref <8.0)

## 2020-09-29 LAB — CBC
HCT: 38.8 % (ref 38.5–50.0)
Hemoglobin: 12.5 g/dL — ABNORMAL LOW (ref 13.2–17.1)
MCH: 27.1 pg (ref 27.0–33.0)
MCHC: 32.2 g/dL (ref 32.0–36.0)
MCV: 84.2 fL (ref 80.0–100.0)
MPV: 10.4 fL (ref 7.5–12.5)
Platelets: 334 10*3/uL (ref 140–400)
RBC: 4.61 10*6/uL (ref 4.20–5.80)
RDW: 15.7 % — ABNORMAL HIGH (ref 11.0–15.0)
WBC: 5.7 10*3/uL (ref 3.8–10.8)

## 2020-10-03 ENCOUNTER — Telehealth: Payer: Self-pay
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
## 2020-10-03 NOTE — Telephone Encounter (Signed)
Yes, that's fine to check RPR at visit.

## 2020-10-05 ENCOUNTER — Ambulatory Visit: Payer: No Typology Code available for payment source | Admitting: Family

## 2020-10-09 ENCOUNTER — Ambulatory Visit (INDEPENDENT_AMBULATORY_CARE_PROVIDER_SITE_OTHER): Payer: Medicare Other | Admitting: Infectious Diseases

## 2020-10-09 ENCOUNTER — Encounter: Payer: Self-pay | Admitting: Infectious Diseases

## 2020-10-09 ENCOUNTER — Other Ambulatory Visit: Payer: Self-pay

## 2020-10-09 VITALS — BP 147/89 | HR 74 | Temp 97.9°F | Wt 225.0 lb

## 2020-10-09 DIAGNOSIS — N3944 Nocturnal enuresis: Secondary | ICD-10-CM | POA: Diagnosis not present

## 2020-10-09 DIAGNOSIS — R509 Fever, unspecified: Secondary | ICD-10-CM

## 2020-10-09 DIAGNOSIS — R9389 Abnormal findings on diagnostic imaging of other specified body structures: Secondary | ICD-10-CM

## 2020-10-09 DIAGNOSIS — R937 Abnormal findings on diagnostic imaging of other parts of musculoskeletal system: Secondary | ICD-10-CM

## 2020-10-09 NOTE — Telephone Encounter (Signed)
Contacted [name]; patient seeing Keith Madeira, NP today. Notified [name] that provider isn't comfortable testing patient's RPR given he's being seen here for diskitis. Requested HD reach out to do their assessment and testing.   Jaxtin Raimondo [name], RN

## 2020-10-09 NOTE — Progress Notes (Signed)
Patient: Keith Dixon  DOB: 1960-06-03 MRN: [id number] PCP: Verline Lema, MD     Subjective:  Keith Dixon is a 60 y.o. male here for 2 week follow up on abnormal vertebral MRI.   In brief, the patient was previously seen by Dr. [name] during a recent hospitalization 09/08/2020 with seizures, altered mental status and high fevers (106 F).  Lumbar puncture was done during this time which revealed very mild pleocytosis with 8 white blood cells.  Bacterial and viral testing was negative and antibiotics/antiviral treatments were stopped.  MRI of the brain was also unremarkable.  Vertebral MRI was done to follow-up xanthochromia on CSF revealing significant disc disease and spinal stenosis without cord compression but advanced biforaminal impingement. There was some edematous signal throughout the L2-3 and L3-4 disc space with asymmetric facet marrow edema to the right at L2-3. 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 High grade spinal stenosis at L4-5 and large migrating disc extrusion at L2-3. R psoas edema noted as well and concern for superinfection was raised. Dr. [name] has seen him outpatient for his abnormal MRI and recent fever.   Since his appointment with Dr. [name] 2 weeks ago he tells me that he has been about the same.  His girlfriend joins [place] during the visit and has concerns that he has been getting worse with regards to his function/mobility and back pain.  She also notes that he has had some problems with urinary incontinence at night.  Mr. Keith Dixon reports that the urinary incontinence has been ongoing now almost a year but he was hiding this from her.  He says that his back pain has been a "10 out of 10" for several years now but does agree that has been acutely worse since his recent seizure.  He has not had any evidence of sweating, fevers, chills or fatigue.  He describes himself to be pretty much a couch potato and just sits at home due to his severely debilitated back  pain and leg weakness.  He uses a front rolling walker to ambulate from the recliner/bed to the bathroom.  No falls at home over the last few weeks.  He is seen at the local [facility] but acknowledges that he has not actually been seen as a patient for many many years because he describes himself to be a stubborn man.  He does have an upcoming neurology appointment at the [facility] in June.  He is not clear whether he has a neurosurgeon or not.  He states that he has had seizures intermittently for many years.  He is not sure if he always has a fever associated with these events because he is usually alone.  He does have a prodrome that he cannot quite describe but knows the seizures coming in he lays down to try to go to sleep when this happens.    Review of Systems  Constitutional: Negative for chills and fever.  HENT: Negative for tinnitus.   Eyes: Negative for blurred vision and photophobia.  Respiratory: Negative for cough and sputum production.   Cardiovascular: Negative for chest pain.  Gastrointestinal: Negative for diarrhea, nausea and vomiting.  Genitourinary: Negative for dysuria, flank pain, frequency and urgency.       Incontinence at night.   Musculoskeletal: Positive for back pain and neck pain. Negative for falls and joint pain.  Skin: Negative for rash.       Large nodule on the right temple. Non-tender. Has gotten bigger  over the course of a few years.   Neurological: Positive for tingling (shooting pains), sensory change and weakness. Negative for headaches.  Psychiatric/Behavioral: Positive for depression (PTSD).    Past Medical History:  Diagnosis Date  . Anxiety   . Bipolar 1 disorder, depressed ([place])   . Bipolar disorder ([place])   . Depression   . History of posttraumatic stress disorder (PTSD)   . Hypertension   . PTSD (post-traumatic stress disorder)   . Seizures ([place])   . Smoker   . Tobacco use disorder     Outpatient Medications Prior to Visit  Medication Sig Dispense  Refill  . acetaminophen (TYLENOL) 500 MG tablet Take 2 tablets (1,000 mg total) by mouth every 6 (six) hours as needed for mild pain. 30 tablet 0  . amLODipine (NORVASC) 10 MG tablet Take 10 mg by mouth daily.    . baclofen (LIORESAL) 10 MG tablet Take 10 mg by mouth 2 (two) times daily as needed for muscle spasms.    . Cholecalciferol (VITAMIN D3) 50 MCG (2000 UT) TABS Take 2,000 Units by mouth daily.    Marland Kitchen gabapentin (NEURONTIN) 300 MG capsule Take 600 mg by mouth in the morning.    . hydrOXYzine (VISTARIL) 50 MG capsule Take 50 mg by mouth in the morning.    . lamoTRIgine (LAMICTAL) 100 MG tablet Take 1 tablet (100 mg total) by mouth 2 (two) times daily. 60 tablet 2  . levETIRAcetam (KEPPRA) 750 MG tablet Take 1,500 mg by mouth in the morning.    Marland Kitchen lisinopril-hydrochlorothiazide (ZESTORETIC) 20-12.5 MG tablet Take 1 tablet by mouth daily.    . meloxicam (MOBIC) 15 MG tablet Take 15 mg by mouth daily after breakfast. Taking after meal for pain    . QUEtiapine (SEROQUEL) 200 MG tablet Take 200 mg by mouth at bedtime.    . sertraline (ZOLOFT) 100 MG tablet Take 100 mg by mouth daily after breakfast.     No facility-administered medications prior to visit.     No Known Allergies  Social History   Tobacco Use  . Smoking status: Current Every Day Smoker    Packs/day: 1.00    Years: 20.00    Pack years: 20.00    Types: Cigarettes  . Smokeless tobacco: Never Used  Substance Use Topics  . Alcohol use: No    Comment: once or twice a year  . Drug use: Yes    Types: Marijuana, Cocaine    Comment: pt stated he quit 2 weeks ago 08/10/12, pt reports doing marijuana on 11/13/12    Family History  Problem Relation Age of Onset  . Seizures Mother   . Breast cancer Mother 36  . Hypertension Brother     Objective:   Vitals:   10/09/20 1039  BP: (!) 147/89  Pulse: 74  Temp: 97.9 F (36.6 C)  SpO2: 99%  Weight: 225 lb (102.1 kg)   Body mass index is 30.52 kg/m.  Physical Exam Vitals  reviewed.  Constitutional:      Appearance: He is not ill-appearing.  HENT:     Head:      Comments: Firm nodule measuring > 3cm. No ulcerations/umbilication. No erythema or induration. No pain.     Mouth/Throat:     Dentition: Dental caries present.     Pharynx: Oropharynx is clear. No oropharyngeal exudate.  Eyes:     General: No visual field deficit.    Conjunctiva/sclera: Conjunctivae normal.  Musculoskeletal:     Lumbar back: Swelling  and bony tenderness present. Decreased range of motion.       Back:     Comments: Swelling to the right paraspinal muscles noted on exam > L. +Vertebral tenderness about L2-4.   Neurological:     Mental Status: He is alert and oriented to person, place, and time.     Cranial Nerves: No facial asymmetry.     Sensory: Sensation is intact.     Motor: Weakness present.     Gait: Gait abnormal.     Lab Results: Lab Results  Component Value Date   WBC 5.0 10/09/2020   HGB 13.9 10/09/2020   HCT 43.3 10/09/2020   MCV 83.8 10/09/2020   PLT 217 10/09/2020    Lab Results  Component Value Date   CREATININE 1.16 09/28/2020   BUN 21 09/28/2020   NA 140 09/28/2020   K 4.3 09/28/2020   CL 105 09/28/2020   CO2 26 09/28/2020    Lab Results  Component Value Date   ALT 13 09/28/2020   AST 14 09/28/2020   ALKPHOS 57 09/10/2020   BILITOT 0.3 09/28/2020     Assessment & Plan:   Problem List Items Addressed This Visit      Unprioritized   Nocturnal enuresis    Has been ongoing now > 1 year per the patient. He is uncertain if he completely empties his bladder with each void but claims to have no symptoms during the day (though he hides symptoms and I worry is diminishing things). 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 Explained this could be stemming from his spinal stenosis but will screen PSA. I think the urgency is getting him to see neurosurgery over urology but he will talk with his PCP about urology  referral at upcoming visit.       Relevant Orders   PSA (Completed)   Febrile illness    Recent fevers unexplained - ?neurologic response in the setting of seizure. LP unremarkable.  Working to rule out/in vertebral infection but not having any objective or subjective fevers since discharge.       Abnormal MRI, lumbar spine - Primary    Not much change since last OV with Dr. [name].  No fevers/chills since discharge in early May. Inflammatory markers were all normal which is pretty abnormal in the setting of acute vertebral infection. In the absence of constitutional symptoms I don't think blood cultures would be revealing.   MRI results reviewed - significant underlying anatomical pathology with multi-level disc protrusions, endplate/facet changes and severe spinal stenosis. He is scheduled for another repeat MRI to evaluate Saturday to follow up on psoas muscle edema that was more concerning feature for infection.  He does have vertebral tenderness with paraspinal swelling evident on exam. His ability to ambulate is severely impacted by his back pain and moves very slow with little endurance.   Whether this is infectious pathology or not I am pretty certain he is going to need the help of a neurosurgeon. Will trend ESR/CRP and WBC again today to follow for any changes and follow up MRI results.       Relevant Orders   C-reactive protein (Completed)   Sedimentation rate (Completed)   CBC (Completed)     Follow up to be determined after pending blood work and imaging results are available.   Janene Madeira, MSN, NP-C [facility] [place] Pager: [phone] Office: [phone]  10/10/20  1:48 PM

## 2020-10-09 NOTE — Patient Instructions (Addendum)
Nice to meet you.   Call your primary provider at the [facility] to ask about a urologists referral.  Will check your prostate blood work to be sure there is nothing concerning there with your bladder problems.   Will call you once your blood tests and MRI results are back.

## 2020-10-10 DIAGNOSIS — N3944 Nocturnal enuresis: Secondary | ICD-10-CM | POA: Insufficient documentation

## 2020-10-10 LAB — CBC
HCT: 43.3 % (ref 38.5–50.0)
Hemoglobin: 13.9 g/dL (ref 13.2–17.1)
MCH: 26.9 pg — ABNORMAL LOW (ref 27.0–33.0)
MCHC: 32.1 g/dL (ref 32.0–36.0)
MCV: 83.8 fL (ref 80.0–100.0)
MPV: 10.6 fL (ref 7.5–12.5)
Platelets: 217 10*3/uL (ref 140–400)
RBC: 5.17 10*6/uL (ref 4.20–5.80)
RDW: 15.5 % — ABNORMAL HIGH (ref 11.0–15.0)
WBC: 5 10*3/uL (ref 3.8–10.8)

## 2020-10-10 LAB — C-REACTIVE PROTEIN: CRP: 2.4 mg/L (ref <8.0)

## 2020-10-10 LAB — SEDIMENTATION RATE: Sed Rate: 2 mm/h (ref 0–20)

## 2020-10-10 LAB — PSA: PSA: 0.96 ng/mL (ref <=4.00)
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
## 2020-10-10 NOTE — Assessment & Plan Note (Signed)
Recent fevers unexplained - ?neurologic response in the setting of seizure. LP unremarkable.  Working to rule out/in vertebral infection but not having any objective or subjective fevers since discharge.

## 2020-10-10 NOTE — Assessment & Plan Note (Addendum)
Not much change since last OV with Dr. [name].  No fevers/chills since discharge in early May. Inflammatory markers were all normal which is pretty abnormal in the setting of acute vertebral infection. In the absence of constitutional symptoms I don't think blood cultures would be revealing.   MRI results reviewed - significant underlying anatomical pathology with multi-level disc protrusions, endplate/facet changes and severe spinal stenosis. He is scheduled for another repeat MRI to evaluate Saturday to follow up on psoas muscle edema that was more concerning feature for infection.  He does have vertebral tenderness with paraspinal swelling evident on exam. His ability to ambulate is severely impacted by his back pain and moves very slow with little endurance.   Whether this is infectious pathology or not I am pretty certain he is going to need the help of a neurosurgeon. Will trend ESR/CRP and WBC again today to follow for any changes and follow up MRI results.

## 2020-10-10 NOTE — Assessment & Plan Note (Signed)
Has been ongoing now > 1 year per the patient. He is uncertain if he completely empties his bladder with each void but claims to have no symptoms during the day (though he hides symptoms and I worry is diminishing things). 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 Explained this could be stemming from his spinal stenosis but will screen PSA. I think the urgency is getting him to see neurosurgery over urology but he will talk with his PCP about urology referral at upcoming visit.

## 2020-10-13 ENCOUNTER — Ambulatory Visit (HOSPITAL_COMMUNITY): Payer: No Typology Code available for payment source

## 2020-10-16 ENCOUNTER — Telehealth: Payer: Self-pay | Admitting: Infectious Diseases

## 2020-10-16 NOTE — Telephone Encounter (Signed)
His MRI was not cancelled - [facility]. He is waiting on a call back from them today.   He is in a lot of pain, no fevers/chills or any new symptoms.  He has a neurology appointment later this week.   His CRP and ESR and WBC are all normal. I explained while this is not a complete rule out for infection contributing to his vertebral issues, it is not arguing in favor for infection. The repeat MRI will be more helpful to look at any changes over the last month and for surgical planning (which I suspect he will need to help with symptoms given the non-infectious pathology noted on the last MRI and severe mobility impact).   He thanked me for my call and will inform us once MRI is rescheduled and arranged.  Continue to defer antibiotics in the mean time.  

## 2020-10-30 ENCOUNTER — Ambulatory Visit (HOSPITAL_COMMUNITY): Admission: RE | Admit: 2020-10-30 | Payer: No Typology Code available for payment source | Source: Ambulatory Visit

## 2020-11-14 ENCOUNTER — Ambulatory Visit (HOSPITAL_COMMUNITY)
Admission: RE | Admit: 2020-11-14 | Discharge: 2020-11-14 | Disposition: A | Discharge status: Home / Self Care | Payer: No Typology Code available for payment source | Source: Ambulatory Visit | Attending: Internal Medicine | Admitting: Internal Medicine

## 2020-11-14 ENCOUNTER — Other Ambulatory Visit: Payer: Self-pay

## 2020-11-14 DIAGNOSIS — R9389 Abnormal findings on diagnostic imaging of other specified body structures: Secondary | ICD-10-CM | POA: Insufficient documentation

## 2020-11-14 MED ORDER — GADOBUTROL 1 MMOL/ML IV SOLN
10.0000 mL | Freq: Once | INTRAVENOUS | Status: AC | PRN
  Administered 2020-11-14: 10 mL via INTRAVENOUS

## 2020-11-21 ENCOUNTER — Other Ambulatory Visit: Payer: Self-pay | Admitting: Neurosurgery

## 2020-11-28 ENCOUNTER — Telehealth: Payer: Self-pay

## 2020-11-28 NOTE — Telephone Encounter (Signed)
Relayed to patient per Dr. [name] that his MRI did not show any signs of infection and that he should follow up with neurology regarding pain and weakness. Also relayed to patient's girlfriend at patient's request. They both verbalized understanding and have no further questions.   Beryle Flock, RN

## 2020-11-28 NOTE — Telephone Encounter (Signed)
-----   Message from Jabier Mutton, MD sent at 11/28/2020  3:19 PM EDT ----- Hi team  Please let patient know his mri shows no sign of infection. He should follow up neurology for further evaluation pain/weakness  I include Colletta [place] as she might want to know too  thanks

## 2020-12-04 NOTE — Progress Notes (Signed)
Surgical Instructions    Your procedure is scheduled on Wednesday, August 3rd, 2022 .  Report to [facility] Main Entrance "A" at 06:30 A.M., then check in with the Admitting office.  Call this number if you have problems the morning of surgery:  [phone]   If you have any questions prior to your surgery date call [phone]: Open Monday-Friday 8am-4pm    Remember:  Do not eat or drink after midnight the night before your surgery    Take these medicines the morning of surgery with A SIP OF WATER:  amLODipine (NORVASC) gabapentin (NEURONTIN) hydrOXYzine (VISTARIL)  lamoTRIgine (LAMICTAL) levETIRAcetam (KEPPRA) sertraline (ZOLOFT)  baclofen (LIORESAL) acetaminophen (TYLENOL) - if needed  As of today, STOP taking any Aspirin (unless otherwise instructed by your surgeon) Aleve, Naproxen, Ibuprofen, Motrin, Advil, Goody's, BC's, all herbal medications, fish oil, and all vitamins.          Do not wear jewelry  Do not wear lotions, powders, colognes, or deodorant. Men may shave face and neck. Do not bring valuables to the hospital. DO Not wear nail polish, gel polish, artificial nails, or any other type of covering on natural nails including finger and toenails. If patients have artificial nails, gel coating, etc. that need to be removed by a nail salon please have this removed prior to surgery or surgery may need to be canceled/delayed if the surgeon/ anesthesia feels like the patient is unable to be adequately monitored.             [facility] is not responsible for any belongings or valuables.  Do NOT Smoke (Tobacco/Vaping) or drink Alcohol 24 hours prior to your procedure If you use a CPAP at night, you may bring all equipment for your overnight stay.   Contacts, glasses, dentures or bridgework may not be worn into surgery, please bring cases for these belongings   For patients admitted to the hospital, discharge time will be determined by your treatment team.    Patients discharged the day of surgery will not be allowed to drive home, and someone needs to stay with them for 24 hours.  ONLY 1 SUPPORT PERSON MAY BE PRESENT WHILE YOU ARE IN SURGERY. IF YOU ARE TO BE ADMITTED ONCE YOU ARE IN YOUR ROOM YOU WILL BE ALLOWED TWO (2) VISITORS.  Minor children may have two parents present. Special consideration for safety and communication needs will be reviewed on a case by case basis.  Special instructions:    Oral Hygiene is also important to reduce your risk of infection.  Remember - BRUSH YOUR TEETH THE MORNING OF SURGERY WITH YOUR REGULAR TOOTHPASTE   [place]- Preparing For Surgery  Before surgery, you can play an important role. Because skin is not sterile, your skin needs to be as free of germs as possible. You can reduce the number of germs on your skin by washing with CHG (chlorahexidine gluconate) Soap before surgery.  CHG is an antiseptic cleaner which kills germs and bonds with the skin to continue killing germs even after washing.     Please do not use if you have an allergy to CHG or antibacterial soaps. If your skin becomes reddened/irritated stop using the CHG.  Do not shave (including legs and underarms) for at least 48 hours prior to first CHG shower. It is OK to shave your face.  Please follow these instructions carefully.     Shower the NIGHT BEFORE SURGERY and the MORNING OF SURGERY with CHG Soap.   If  you chose to wash your hair, wash your hair first as usual with your normal shampoo. After you shampoo, rinse your hair and body thoroughly to remove the shampoo.  Then [employer] and genitals (private parts) with your normal soap and rinse thoroughly to remove soap.  After that Use CHG Soap as you would any other liquid soap. You can apply CHG directly to the skin and wash gently with a scrungie or a clean washcloth.   Apply the CHG Soap to your body ONLY FROM THE NECK DOWN.  Do not use on open wounds or open sores. Avoid contact  with your eyes, ears, mouth and genitals (private parts). Wash Face and genitals (private parts)  with your normal soap.   Wash thoroughly, paying special attention to the area where your surgery will be performed.  Thoroughly rinse your body with warm water from the neck down.  DO NOT shower/wash with your normal soap after using and rinsing off the CHG Soap.  Pat yourself dry with a CLEAN TOWEL.  Wear CLEAN PAJAMAS to bed the night before surgery  Place CLEAN SHEETS on your bed the night before your surgery  DO NOT SLEEP WITH PETS.   Day of Surgery:  Take a shower with CHG soap. Wear Clean/Comfortable clothing the morning of surgery Do not apply any deodorants/lotions.   Remember to brush your teeth WITH YOUR REGULAR TOOTHPASTE.   Please read over the following fact sheets that you were given.

## 2020-12-05 ENCOUNTER — Other Ambulatory Visit: Payer: Self-pay

## 2020-12-05 ENCOUNTER — Encounter (HOSPITAL_COMMUNITY)
Admission: RE | Admit: 2020-12-05 | Discharge: 2020-12-05 | Disposition: A | Discharge status: Home / Self Care | Payer: No Typology Code available for payment source | Source: Ambulatory Visit | Attending: Neurosurgery | Admitting: Neurosurgery

## 2020-12-05 ENCOUNTER — Encounter (HOSPITAL_COMMUNITY): Payer: Self-pay

## 2020-12-05 DIAGNOSIS — Z01812 Encounter for preprocedural laboratory examination: Secondary | ICD-10-CM | POA: Insufficient documentation

## 2020-12-05 HISTORY — DX: Unspecified osteoarthritis, unspecified site: M19.90

## 2020-12-05 HISTORY — DX: Sleep apnea, unspecified: G47.30

## 2020-12-05 LAB — COMPREHENSIVE METABOLIC PANEL
ALT: 20 U/L (ref 0–44)
AST: 22 U/L (ref 15–41)
Albumin: 3.8 g/dL (ref 3.5–5.0)
Alkaline Phosphatase: 62 U/L (ref 38–126)
Anion gap: 4 — ABNORMAL LOW (ref 5–15)
BUN: 10 mg/dL (ref 6–20)
CO2: 30 mmol/L (ref 22–32)
Calcium: 9.2 mg/dL (ref 8.9–10.3)
Chloride: 104 mmol/L (ref 98–111)
Creatinine, Ser: 1.08 mg/dL (ref 0.61–1.24)
GFR, Estimated: >60 mL/min (ref >60)
Glucose, Bld: 89 mg/dL (ref 70–99)
Potassium: 4 mmol/L (ref 3.5–5.1)
Sodium: 138 mmol/L (ref 135–145)
Total Bilirubin: 0.4 mg/dL (ref 0.3–1.2)
Total Protein: 6.4 g/dL — ABNORMAL LOW (ref 6.5–8.1)

## 2020-12-05 LAB — CBC
HCT: 39.2 % (ref 39.0–52.0)
Hemoglobin: 12.6 g/dL — ABNORMAL LOW (ref 13.0–17.0)
MCH: 27.2 pg (ref 26.0–34.0)
MCHC: 32.1 g/dL (ref 30.0–36.0)
MCV: 84.5 fL (ref 80.0–100.0)
Platelets: 224 10*3/uL (ref 150–400)
RBC: 4.64 MIL/uL (ref 4.22–5.81)
RDW: 15.9 % — ABNORMAL HIGH (ref 11.5–15.5)
WBC: 4.7 10*3/uL (ref 4.0–10.5)
nRBC: 0 % (ref 0.0–0.2)

## 2020-12-05 LAB — SURGICAL PCR SCREEN
MRSA, PCR: NEGATIVE
Staphylococcus aureus: NEGATIVE

## 2020-12-05 NOTE — Progress Notes (Signed)
PCP - Dr. Reubin Milan with [facility] - Denies  Chest x-ray - 09/08/20 EKG - 09/08/20 Stress Test - 2015 ECHO - Denies Cardiac Cath - Denies  Sleep Study - Yes has OSA CPAP - Does not use   DM - Denies  COVID TEST-  Given directions / address to Covid test site drive thru.  Instructed to go on 12/10/20   Anesthesia review:  No  Patient denies shortness of breath, fever, cough and chest pain at PAT appointment   All instructions explained to the patient, with a verbal understanding of the material. Patient agrees to go over the instructions while at home for a better understanding. Patient also instructed to wear a mask in public after being tested for COVID-19. The opportunity to ask questions was provided.

## 2020-12-10 ENCOUNTER — Other Ambulatory Visit: Payer: Self-pay

## 2020-12-11 NOTE — Anesthesia Preprocedure Evaluation (Addendum)
Anesthesia Evaluation  Patient identified by MRN, date of birth, ID band Patient awake    Reviewed: Allergy & Precautions, NPO status , Patient's Chart, lab work & pertinent test results  History of Anesthesia Complications Negative for: history of anesthetic complications  Airway Mallampati: II  TM Distance: >3 FB Neck ROM: Full    Dental  (+) Poor Dentition, Dental Advisory Given, Missing   Pulmonary sleep apnea , Current Smoker and Patient abstained from smoking.,    Pulmonary exam normal        Cardiovascular hypertension, Pt. on medications Normal cardiovascular exam     Neuro/Psych Seizures -,  Anxiety Depression Bipolar Disorder Lumbar scoliosis    GI/Hepatic negative GI ROS, Neg liver ROS,   Endo/Other  negative endocrine ROS  Renal/GU negative Renal ROS  negative genitourinary   Musculoskeletal  (+) Arthritis ,   Abdominal   Peds  Hematology Hgb 12.6   Anesthesia Other Findings Day of surgery medications reviewed with patient.  Reproductive/Obstetrics negative OB ROS                            Anesthesia Physical Anesthesia Plan  ASA: 2  Anesthesia Plan: General   Post-op Pain Management:    Induction: Intravenous  PONV Risk Score and Plan: 2 and Ondansetron, Treatment may vary due to age or medical condition, Midazolam and Dexamethasone  Airway Management Planned: Oral ETT  Additional Equipment: None  Intra-op Plan:   Post-operative Plan: Extubation in OR  Informed Consent: I have reviewed the patients History and Physical, chart, labs and discussed the procedure including the risks, benefits and alternatives for the proposed anesthesia with the patient or authorized representative who has indicated his/her understanding and acceptance.     Dental advisory given  Plan Discussed with: CRNA  Anesthesia Plan Comments:        Anesthesia Quick Evaluation

## 2020-12-12 ENCOUNTER — Other Ambulatory Visit: Payer: Self-pay

## 2020-12-12 ENCOUNTER — Inpatient Hospital Stay (HOSPITAL_COMMUNITY): Payer: No Typology Code available for payment source

## 2020-12-12 ENCOUNTER — Inpatient Hospital Stay (HOSPITAL_COMMUNITY): Payer: No Typology Code available for payment source | Admitting: Certified Registered"

## 2020-12-12 ENCOUNTER — Inpatient Hospital Stay (HOSPITAL_COMMUNITY)
Admission: RE | Admit: 2020-12-12 | Discharge: 2021-01-31 | DRG: 003 | Disposition: A | Discharge status: Skilled Nursing Facility | Payer: No Typology Code available for payment source | Attending: Neurosurgery | Admitting: Neurosurgery

## 2020-12-12 ENCOUNTER — Encounter (HOSPITAL_COMMUNITY): Payer: Self-pay

## 2020-12-12 ENCOUNTER — Encounter (HOSPITAL_COMMUNITY)
Admission: RE | Disposition: A | Discharge status: Skilled Nursing Facility | Payer: Self-pay | Source: Home / Self Care | Attending: Neurosurgery

## 2020-12-12 DIAGNOSIS — E669 Obesity, unspecified: Secondary | ICD-10-CM | POA: Diagnosis present

## 2020-12-12 DIAGNOSIS — I10 Essential (primary) hypertension: Secondary | ICD-10-CM | POA: Diagnosis present

## 2020-12-12 DIAGNOSIS — Z9889 Other specified postprocedural states: Secondary | ICD-10-CM | POA: Diagnosis not present

## 2020-12-12 DIAGNOSIS — Z419 Encounter for procedure for purposes other than remedying health state, unspecified: Secondary | ICD-10-CM

## 2020-12-12 DIAGNOSIS — I82811 Embolism and thrombosis of superficial veins of right lower extremities: Secondary | ICD-10-CM | POA: Diagnosis not present

## 2020-12-12 DIAGNOSIS — E870 Hyperosmolality and hypernatremia: Secondary | ICD-10-CM | POA: Diagnosis not present

## 2020-12-12 DIAGNOSIS — E875 Hyperkalemia: Secondary | ICD-10-CM | POA: Diagnosis not present

## 2020-12-12 DIAGNOSIS — N35919 Unspecified urethral stricture, male, unspecified site: Secondary | ICD-10-CM | POA: Diagnosis present

## 2020-12-12 DIAGNOSIS — I480 Paroxysmal atrial fibrillation: Secondary | ICD-10-CM | POA: Diagnosis present

## 2020-12-12 DIAGNOSIS — D61818 Other pancytopenia: Secondary | ICD-10-CM | POA: Diagnosis not present

## 2020-12-12 DIAGNOSIS — R509 Fever, unspecified: Secondary | ICD-10-CM | POA: Diagnosis not present

## 2020-12-12 DIAGNOSIS — Z452 Encounter for adjustment and management of vascular access device: Secondary | ICD-10-CM

## 2020-12-12 DIAGNOSIS — M4186 Other forms of scoliosis, lumbar region: Secondary | ICD-10-CM | POA: Diagnosis present

## 2020-12-12 DIAGNOSIS — Z9911 Dependence on respirator [ventilator] status: Secondary | ICD-10-CM

## 2020-12-12 DIAGNOSIS — J9601 Acute respiratory failure with hypoxia: Secondary | ICD-10-CM

## 2020-12-12 DIAGNOSIS — M48062 Spinal stenosis, lumbar region with neurogenic claudication: Principal | ICD-10-CM | POA: Diagnosis present

## 2020-12-12 DIAGNOSIS — G40409 Other generalized epilepsy and epileptic syndromes, not intractable, without status epilepticus: Secondary | ICD-10-CM | POA: Diagnosis present

## 2020-12-12 DIAGNOSIS — E860 Dehydration: Secondary | ICD-10-CM | POA: Diagnosis not present

## 2020-12-12 DIAGNOSIS — I82411 Acute embolism and thrombosis of right femoral vein: Secondary | ICD-10-CM | POA: Diagnosis not present

## 2020-12-12 DIAGNOSIS — J9602 Acute respiratory failure with hypercapnia: Secondary | ICD-10-CM | POA: Diagnosis not present

## 2020-12-12 DIAGNOSIS — Z8249 Family history of ischemic heart disease and other diseases of the circulatory system: Secondary | ICD-10-CM

## 2020-12-12 DIAGNOSIS — M6282 Rhabdomyolysis: Secondary | ICD-10-CM | POA: Diagnosis not present

## 2020-12-12 DIAGNOSIS — L7632 Postprocedural hematoma of skin and subcutaneous tissue following other procedure: Secondary | ICD-10-CM | POA: Diagnosis not present

## 2020-12-12 DIAGNOSIS — F319 Bipolar disorder, unspecified: Secondary | ICD-10-CM | POA: Diagnosis present

## 2020-12-12 DIAGNOSIS — M419 Scoliosis, unspecified: Secondary | ICD-10-CM | POA: Diagnosis present

## 2020-12-12 DIAGNOSIS — R578 Other shock: Secondary | ICD-10-CM | POA: Diagnosis not present

## 2020-12-12 DIAGNOSIS — E781 Pure hyperglyceridemia: Secondary | ICD-10-CM | POA: Diagnosis not present

## 2020-12-12 DIAGNOSIS — J9503 Malfunction of tracheostomy stoma: Secondary | ICD-10-CM | POA: Diagnosis not present

## 2020-12-12 DIAGNOSIS — D62 Acute posthemorrhagic anemia: Secondary | ICD-10-CM | POA: Diagnosis not present

## 2020-12-12 DIAGNOSIS — E538 Deficiency of other specified B group vitamins: Secondary | ICD-10-CM | POA: Diagnosis not present

## 2020-12-12 DIAGNOSIS — E861 Hypovolemia: Secondary | ICD-10-CM | POA: Diagnosis not present

## 2020-12-12 DIAGNOSIS — I63413 Cerebral infarction due to embolism of bilateral middle cerebral arteries: Secondary | ICD-10-CM | POA: Diagnosis not present

## 2020-12-12 DIAGNOSIS — Z0189 Encounter for other specified special examinations: Secondary | ICD-10-CM

## 2020-12-12 DIAGNOSIS — Z79899 Other long term (current) drug therapy: Secondary | ICD-10-CM

## 2020-12-12 DIAGNOSIS — Z20822 Contact with and (suspected) exposure to covid-19: Secondary | ICD-10-CM | POA: Diagnosis present

## 2020-12-12 DIAGNOSIS — M4319 Spondylolisthesis, multiple sites in spine: Secondary | ICD-10-CM | POA: Diagnosis present

## 2020-12-12 DIAGNOSIS — G9341 Metabolic encephalopathy: Secondary | ICD-10-CM | POA: Diagnosis not present

## 2020-12-12 DIAGNOSIS — N179 Acute kidney failure, unspecified: Secondary | ICD-10-CM | POA: Diagnosis not present

## 2020-12-12 DIAGNOSIS — G8191 Hemiplegia, unspecified affecting right dominant side: Secondary | ICD-10-CM | POA: Diagnosis present

## 2020-12-12 DIAGNOSIS — Z9289 Personal history of other medical treatment: Secondary | ICD-10-CM

## 2020-12-12 DIAGNOSIS — M1990 Unspecified osteoarthritis, unspecified site: Secondary | ICD-10-CM | POA: Diagnosis present

## 2020-12-12 DIAGNOSIS — G834 Cauda equina syndrome: Secondary | ICD-10-CM | POA: Diagnosis present

## 2020-12-12 DIAGNOSIS — R569 Unspecified convulsions: Secondary | ICD-10-CM | POA: Diagnosis not present

## 2020-12-12 DIAGNOSIS — N170 Acute kidney failure with tubular necrosis: Secondary | ICD-10-CM | POA: Diagnosis not present

## 2020-12-12 DIAGNOSIS — M5126 Other intervertebral disc displacement, lumbar region: Secondary | ICD-10-CM | POA: Diagnosis present

## 2020-12-12 DIAGNOSIS — Y92230 Patient room in hospital as the place of occurrence of the external cause: Secondary | ICD-10-CM | POA: Diagnosis not present

## 2020-12-12 DIAGNOSIS — F17210 Nicotine dependence, cigarettes, uncomplicated: Secondary | ICD-10-CM | POA: Diagnosis present

## 2020-12-12 DIAGNOSIS — E872 Acidosis: Secondary | ICD-10-CM | POA: Diagnosis not present

## 2020-12-12 DIAGNOSIS — S9032XA Contusion of left foot, initial encounter: Secondary | ICD-10-CM | POA: Diagnosis not present

## 2020-12-12 DIAGNOSIS — F419 Anxiety disorder, unspecified: Secondary | ICD-10-CM | POA: Diagnosis present

## 2020-12-12 DIAGNOSIS — I82621 Acute embolism and thrombosis of deep veins of right upper extremity: Secondary | ICD-10-CM | POA: Diagnosis not present

## 2020-12-12 DIAGNOSIS — Z95828 Presence of other vascular implants and grafts: Secondary | ICD-10-CM | POA: Diagnosis not present

## 2020-12-12 DIAGNOSIS — Z978 Presence of other specified devices: Secondary | ICD-10-CM | POA: Diagnosis not present

## 2020-12-12 DIAGNOSIS — I82409 Acute embolism and thrombosis of unspecified deep veins of unspecified lower extremity: Secondary | ICD-10-CM | POA: Diagnosis not present

## 2020-12-12 DIAGNOSIS — J95859 Other complication of respirator [ventilator]: Secondary | ICD-10-CM

## 2020-12-12 DIAGNOSIS — E871 Hypo-osmolality and hyponatremia: Secondary | ICD-10-CM | POA: Diagnosis not present

## 2020-12-12 DIAGNOSIS — T41295A Adverse effect of other general anesthetics, initial encounter: Secondary | ICD-10-CM | POA: Diagnosis not present

## 2020-12-12 DIAGNOSIS — I63412 Cerebral infarction due to embolism of left middle cerebral artery: Secondary | ICD-10-CM | POA: Diagnosis not present

## 2020-12-12 DIAGNOSIS — J9690 Respiratory failure, unspecified, unspecified whether with hypoxia or hypercapnia: Secondary | ICD-10-CM

## 2020-12-12 DIAGNOSIS — G928 Other toxic encephalopathy: Secondary | ICD-10-CM | POA: Diagnosis not present

## 2020-12-12 DIAGNOSIS — R4182 Altered mental status, unspecified: Secondary | ICD-10-CM | POA: Diagnosis not present

## 2020-12-12 DIAGNOSIS — Z930 Tracheostomy status: Secondary | ICD-10-CM

## 2020-12-12 DIAGNOSIS — R410 Disorientation, unspecified: Secondary | ICD-10-CM | POA: Diagnosis not present

## 2020-12-12 DIAGNOSIS — Z430 Encounter for attention to tracheostomy: Secondary | ICD-10-CM

## 2020-12-12 DIAGNOSIS — Z4659 Encounter for fitting and adjustment of other gastrointestinal appliance and device: Secondary | ICD-10-CM

## 2020-12-12 DIAGNOSIS — M4126 Other idiopathic scoliosis, lumbar region: Secondary | ICD-10-CM | POA: Diagnosis not present

## 2020-12-12 DIAGNOSIS — I517 Cardiomegaly: Secondary | ICD-10-CM | POA: Diagnosis not present

## 2020-12-12 DIAGNOSIS — M7989 Other specified soft tissue disorders: Secondary | ICD-10-CM | POA: Diagnosis not present

## 2020-12-12 DIAGNOSIS — M48061 Spinal stenosis, lumbar region without neurogenic claudication: Secondary | ICD-10-CM | POA: Diagnosis present

## 2020-12-12 DIAGNOSIS — I82461 Acute embolism and thrombosis of right calf muscular vein: Secondary | ICD-10-CM | POA: Diagnosis not present

## 2020-12-12 DIAGNOSIS — Z6832 Body mass index (BMI) 32.0-32.9, adult: Secondary | ICD-10-CM

## 2020-12-12 DIAGNOSIS — H50112 Monocular exotropia, left eye: Secondary | ICD-10-CM | POA: Diagnosis present

## 2020-12-12 DIAGNOSIS — I82C12 Acute embolism and thrombosis of left internal jugular vein: Secondary | ICD-10-CM | POA: Diagnosis not present

## 2020-12-12 DIAGNOSIS — T8132XA Disruption of internal operation (surgical) wound, not elsewhere classified, initial encounter: Secondary | ICD-10-CM | POA: Diagnosis not present

## 2020-12-12 DIAGNOSIS — Z791 Long term (current) use of non-steroidal anti-inflammatories (NSAID): Secondary | ICD-10-CM

## 2020-12-12 DIAGNOSIS — M439 Deforming dorsopathy, unspecified: Secondary | ICD-10-CM | POA: Diagnosis present

## 2020-12-12 DIAGNOSIS — G4733 Obstructive sleep apnea (adult) (pediatric): Secondary | ICD-10-CM | POA: Diagnosis present

## 2020-12-12 DIAGNOSIS — T4275XA Adverse effect of unspecified antiepileptic and sedative-hypnotic drugs, initial encounter: Secondary | ICD-10-CM | POA: Diagnosis not present

## 2020-12-12 DIAGNOSIS — F4310 Post-traumatic stress disorder, unspecified: Secondary | ICD-10-CM | POA: Diagnosis present

## 2020-12-12 DIAGNOSIS — I952 Hypotension due to drugs: Secondary | ICD-10-CM | POA: Diagnosis not present

## 2020-12-12 DIAGNOSIS — I6381 Other cerebral infarction due to occlusion or stenosis of small artery: Secondary | ICD-10-CM | POA: Diagnosis not present

## 2020-12-12 DIAGNOSIS — I6330 Cerebral infarction due to thrombosis of unspecified cerebral artery: Secondary | ICD-10-CM | POA: Insufficient documentation

## 2020-12-12 HISTORY — PX: ANTERIOR LATERAL LUMBAR FUSION 4 LEVELS: SHX5552

## 2020-12-12 HISTORY — PX: CYSTOSCOPY: SHX5120

## 2020-12-12 LAB — ABO/RH: ABO/RH(D): A POS

## 2020-12-12 LAB — SARS CORONAVIRUS 2 BY RT PCR (HOSPITAL ORDER, PERFORMED IN ~~LOC~~ HOSPITAL LAB): SARS Coronavirus 2: NEGATIVE

## 2020-12-12 SURGERY — ANTERIOR LATERAL LUMBAR FUSION 4 LEVELS
Anesthesia: General

## 2020-12-12 MED ORDER — SODIUM CHLORIDE 0.9 % IV SOLN: 250.0000 mL | INTRAVENOUS | Status: DC

## 2020-12-12 MED ORDER — KETAMINE HCL 10 MG/ML IJ SOLN
INTRAMUSCULAR | Status: DC | PRN
  Administered 2020-12-12: 10 mg via INTRAVENOUS
  Administered 2020-12-12: 50 mg via INTRAVENOUS
  Administered 2020-12-12 (×4): 10 mg via INTRAVENOUS

## 2020-12-12 MED ORDER — PROPOFOL 10 MG/ML IV BOLUS
INTRAVENOUS | Status: AC
  Filled 2020-12-12: qty 20

## 2020-12-12 MED ORDER — BACITRACIN ZINC 500 UNIT/GM EX OINT
TOPICAL_OINTMENT | CUTANEOUS | Status: AC
  Filled 2020-12-12: qty 28.35

## 2020-12-12 MED ORDER — LISINOPRIL 20 MG PO TABS
20.0000 mg | ORAL_TABLET | Freq: Every day | ORAL | Status: DC
  Filled 2020-12-12: qty 1

## 2020-12-12 MED ORDER — PHENOL 1.4 % MT LIQD: 1.0000 | OROMUCOSAL | Status: DC | PRN

## 2020-12-12 MED ORDER — CYCLOBENZAPRINE HCL 10 MG PO TABS
10.0000 mg | ORAL_TABLET | Freq: Three times a day (TID) | ORAL | Status: DC | PRN
  Filled 2020-12-12: qty 1

## 2020-12-12 MED ORDER — SUCCINYLCHOLINE CHLORIDE 200 MG/10ML IV SOSY
PREFILLED_SYRINGE | INTRAVENOUS | Status: DC | PRN
  Administered 2020-12-12: 140 mg via INTRAVENOUS

## 2020-12-12 MED ORDER — EPHEDRINE 5 MG/ML INJ
INTRAVENOUS | Status: AC
  Filled 2020-12-12: qty 5

## 2020-12-12 MED ORDER — KETAMINE HCL 50 MG/5ML IJ SOSY
PREFILLED_SYRINGE | INTRAMUSCULAR | Status: AC
  Filled 2020-12-12: qty 5

## 2020-12-12 MED ORDER — ACETAMINOPHEN 650 MG RE SUPP
650.0000 mg | RECTAL | Status: DC | PRN
  Filled 2020-12-12: qty 1

## 2020-12-12 MED ORDER — LACTATED RINGERS IV SOLN: INTRAVENOUS | Status: DC

## 2020-12-12 MED ORDER — LEVETIRACETAM 750 MG PO TABS
1500.0000 mg | ORAL_TABLET | Freq: Two times a day (BID) | ORAL | Status: DC
  Administered 2020-12-12: 1500 mg via ORAL
  Filled 2020-12-12 (×2): qty 2

## 2020-12-12 MED ORDER — LIDOCAINE 2% (20 MG/ML) 5 ML SYRINGE
INTRAMUSCULAR | Status: AC
  Filled 2020-12-12: qty 5

## 2020-12-12 MED ORDER — DEXAMETHASONE SODIUM PHOSPHATE 10 MG/ML IJ SOLN
INTRAMUSCULAR | Status: AC
  Filled 2020-12-12: qty 1

## 2020-12-12 MED ORDER — OXYCODONE HCL 5 MG PO TABS: 10.0000 mg | ORAL_TABLET | ORAL | Status: DC | PRN

## 2020-12-12 MED ORDER — CHLORHEXIDINE GLUCONATE 0.12 % MT SOLN
15.0000 mL | Freq: Once | OROMUCOSAL | Status: AC
  Administered 2020-12-12: 15 mL via OROMUCOSAL
  Filled 2020-12-12: qty 15

## 2020-12-12 MED ORDER — STERILE WATER FOR IRRIGATION IR SOLN
Status: DC | PRN
  Administered 2020-12-12: 1000 mL

## 2020-12-12 MED ORDER — PROPOFOL 1000 MG/100ML IV EMUL
INTRAVENOUS | Status: AC
  Filled 2020-12-12: qty 500

## 2020-12-12 MED ORDER — THROMBIN 5000 UNITS EX SOLR
CUTANEOUS | Status: AC
  Filled 2020-12-12: qty 5000

## 2020-12-12 MED ORDER — SODIUM CHLORIDE 0.9 % IV SOLN
INTRAVENOUS | Status: DC | PRN
  Administered 2020-12-12: 15 ug/min via INTRAVENOUS
  Administered 2020-12-12 (×2): 80 ug via INTRAVENOUS

## 2020-12-12 MED ORDER — ONDANSETRON HCL 4 MG/2ML IJ SOLN
4.0000 mg | Freq: Four times a day (QID) | INTRAMUSCULAR | Status: DC | PRN
  Administered 2020-12-12 – 2020-12-20 (×2): 4 mg via INTRAVENOUS
  Filled 2020-12-12 (×3): qty 2

## 2020-12-12 MED ORDER — BUPIVACAINE HCL (PF) 0.5 % IJ SOLN
INTRAMUSCULAR | Status: AC
  Filled 2020-12-12: qty 30

## 2020-12-12 MED ORDER — THROMBIN 5000 UNITS EX SOLR
OROMUCOSAL | Status: DC | PRN
  Administered 2020-12-12: 5 mL via TOPICAL

## 2020-12-12 MED ORDER — HYDROMORPHONE HCL 1 MG/ML IJ SOLN
INTRAMUSCULAR | Status: AC
  Filled 2020-12-12: qty 1

## 2020-12-12 MED ORDER — ACETAMINOPHEN 325 MG PO TABS
650.0000 mg | ORAL_TABLET | ORAL | Status: DC | PRN
  Filled 2020-12-12: qty 2

## 2020-12-12 MED ORDER — LISINOPRIL-HYDROCHLOROTHIAZIDE 20-12.5 MG PO TABS: 1.0000 | ORAL_TABLET | Freq: Every morning | ORAL | Status: DC

## 2020-12-12 MED ORDER — EPHEDRINE SULFATE 50 MG/ML IJ SOLN
INTRAMUSCULAR | Status: DC | PRN
  Administered 2020-12-12 (×5): 5 mg via INTRAVENOUS

## 2020-12-12 MED ORDER — AMLODIPINE BESYLATE 10 MG PO TABS: 10.0000 mg | ORAL_TABLET | Freq: Every morning | ORAL | Status: DC

## 2020-12-12 MED ORDER — LIDOCAINE-EPINEPHRINE 1 %-1:100000 IJ SOLN
INTRAMUSCULAR | Status: DC | PRN
  Administered 2020-12-12: 4 mL

## 2020-12-12 MED ORDER — LIDOCAINE-EPINEPHRINE 1 %-1:100000 IJ SOLN
INTRAMUSCULAR | Status: AC
  Filled 2020-12-12: qty 1

## 2020-12-12 MED ORDER — ACETAMINOPHEN 500 MG PO TABS
1000.0000 mg | ORAL_TABLET | Freq: Once | ORAL | Status: AC
  Administered 2020-12-12: 1000 mg via ORAL
  Filled 2020-12-12: qty 2

## 2020-12-12 MED ORDER — ONDANSETRON HCL 4 MG/2ML IJ SOLN
INTRAMUSCULAR | Status: AC
  Filled 2020-12-12: qty 2

## 2020-12-12 MED ORDER — LIDOCAINE 2% (20 MG/ML) 5 ML SYRINGE
INTRAMUSCULAR | Status: DC | PRN
  Administered 2020-12-12: 100 mg via INTRAVENOUS

## 2020-12-12 MED ORDER — GABAPENTIN 300 MG PO CAPS
900.0000 mg | ORAL_CAPSULE | Freq: Two times a day (BID) | ORAL | Status: DC
  Administered 2020-12-12: 900 mg via ORAL
  Filled 2020-12-12 (×2): qty 3

## 2020-12-12 MED ORDER — CHLORHEXIDINE GLUCONATE CLOTH 2 % EX PADS: 6.0000 | MEDICATED_PAD | Freq: Once | CUTANEOUS | Status: DC

## 2020-12-12 MED ORDER — POTASSIUM CHLORIDE IN NACL 20-0.9 MEQ/L-% IV SOLN
INTRAVENOUS | Status: DC
  Filled 2020-12-12: qty 1000

## 2020-12-12 MED ORDER — DEXAMETHASONE SODIUM PHOSPHATE 10 MG/ML IJ SOLN
INTRAMUSCULAR | Status: DC | PRN
  Administered 2020-12-12: 10 mg via INTRAVENOUS

## 2020-12-12 MED ORDER — HYDROMORPHONE HCL 1 MG/ML IJ SOLN: 0.5000 mg | INTRAMUSCULAR | Status: DC | PRN

## 2020-12-12 MED ORDER — PROMETHAZINE HCL 25 MG/ML IJ SOLN
6.2500 mg | INTRAMUSCULAR | Status: DC | PRN
Start: 2020-12-12 — End: 2020-12-12
  Administered 2020-12-12: 6.25 mg via INTRAVENOUS

## 2020-12-12 MED ORDER — BUPIVACAINE HCL (PF) 0.5 % IJ SOLN
INTRAMUSCULAR | Status: DC | PRN
  Administered 2020-12-12: 9 mL
  Administered 2020-12-12: 4 mL

## 2020-12-12 MED ORDER — BUPIVACAINE LIPOSOME 1.3 % IJ SUSP
INTRAMUSCULAR | Status: AC
  Filled 2020-12-12: qty 20

## 2020-12-12 MED ORDER — HYDROMORPHONE HCL 1 MG/ML IJ SOLN
INTRAMUSCULAR | Status: DC | PRN
  Administered 2020-12-12: .5 mg via INTRAVENOUS

## 2020-12-12 MED ORDER — GLYCOPYRROLATE PF 0.2 MG/ML IJ SOSY
PREFILLED_SYRINGE | INTRAMUSCULAR | Status: AC
  Filled 2020-12-12: qty 1

## 2020-12-12 MED ORDER — HYDROMORPHONE HCL 1 MG/ML IJ SOLN
0.2500 mg | INTRAMUSCULAR | Status: DC | PRN
  Administered 2020-12-12 (×4): 0.25 mg via INTRAVENOUS

## 2020-12-12 MED ORDER — FLEET ENEMA 7-19 GM/118ML RE ENEM
1.0000 | ENEMA | Freq: Once | RECTAL | Status: DC | PRN
  Filled 2020-12-12: qty 1

## 2020-12-12 MED ORDER — HYDROCHLOROTHIAZIDE 12.5 MG PO CAPS
12.5000 mg | ORAL_CAPSULE | Freq: Every day | ORAL | Status: DC
  Filled 2020-12-12: qty 1

## 2020-12-12 MED ORDER — PROPOFOL 10 MG/ML IV BOLUS
INTRAVENOUS | Status: DC | PRN
  Administered 2020-12-12: 80 ug/kg/min via INTRAVENOUS
  Administered 2020-12-12: 200 mg via INTRAVENOUS

## 2020-12-12 MED ORDER — OXYCODONE HCL 5 MG PO TABS: 5.0000 mg | ORAL_TABLET | ORAL | Status: DC | PRN

## 2020-12-12 MED ORDER — ORAL CARE MOUTH RINSE: 15.0000 mL | Freq: Once | OROMUCOSAL | Status: AC

## 2020-12-12 MED ORDER — POLYETHYLENE GLYCOL 3350 17 G PO PACK
17.0000 g | PACK | Freq: Every day | ORAL | Status: DC | PRN
  Filled 2020-12-12: qty 1

## 2020-12-12 MED ORDER — ONDANSETRON HCL 4 MG PO TABS
4.0000 mg | ORAL_TABLET | Freq: Four times a day (QID) | ORAL | Status: DC | PRN
  Filled 2020-12-12: qty 1

## 2020-12-12 MED ORDER — DEXMEDETOMIDINE HCL IN NACL 200 MCG/50ML IV SOLN
INTRAVENOUS | Status: DC | PRN
  Administered 2020-12-12 (×6): 4 ug via INTRAVENOUS

## 2020-12-12 MED ORDER — CEFAZOLIN SODIUM-DEXTROSE 2-4 GM/100ML-% IV SOLN
2.0000 g | INTRAVENOUS | Status: AC
  Administered 2020-12-12 (×2): 2 g via INTRAVENOUS
  Filled 2020-12-12: qty 100

## 2020-12-12 MED ORDER — SODIUM CHLORIDE 0.9% FLUSH
3.0000 mL | INTRAVENOUS | Status: DC | PRN
  Administered 2021-01-07 (×2): 3 mL via INTRAVENOUS

## 2020-12-12 MED ORDER — MENTHOL 3 MG MT LOZG: 1.0000 | LOZENGE | OROMUCOSAL | Status: DC | PRN

## 2020-12-12 MED ORDER — 0.9 % SODIUM CHLORIDE (POUR BTL) OPTIME
TOPICAL | Status: DC | PRN
  Administered 2020-12-12: 1000 mL

## 2020-12-12 MED ORDER — PHENYLEPHRINE 40 MCG/ML (10ML) SYRINGE FOR IV PUSH (FOR BLOOD PRESSURE SUPPORT)
PREFILLED_SYRINGE | INTRAVENOUS | Status: AC
  Filled 2020-12-12: qty 10

## 2020-12-12 MED ORDER — HYDROXYZINE HCL 50 MG PO TABS
50.0000 mg | ORAL_TABLET | Freq: Two times a day (BID) | ORAL | Status: DC
  Filled 2020-12-12 (×4): qty 1

## 2020-12-12 MED ORDER — BACLOFEN 10 MG PO TABS
10.0000 mg | ORAL_TABLET | Freq: Two times a day (BID) | ORAL | Status: DC
  Filled 2020-12-12 (×3): qty 1

## 2020-12-12 MED ORDER — HYDROMORPHONE HCL 1 MG/ML IJ SOLN
INTRAMUSCULAR | Status: AC
  Filled 2020-12-12: qty 0.5

## 2020-12-12 MED ORDER — LAMOTRIGINE 100 MG PO TABS
100.0000 mg | ORAL_TABLET | Freq: Two times a day (BID) | ORAL | Status: DC
  Administered 2020-12-12: 100 mg via ORAL
  Filled 2020-12-12 (×2): qty 1

## 2020-12-12 MED ORDER — ONDANSETRON HCL 4 MG/2ML IJ SOLN
INTRAMUSCULAR | Status: DC | PRN
  Administered 2020-12-12: 4 mg via INTRAVENOUS

## 2020-12-12 MED ORDER — DOCUSATE SODIUM 100 MG PO CAPS
100.0000 mg | ORAL_CAPSULE | Freq: Two times a day (BID) | ORAL | Status: DC
  Administered 2020-12-12: 100 mg via ORAL
  Filled 2020-12-12 (×2): qty 1

## 2020-12-12 MED ORDER — FENTANYL CITRATE (PF) 250 MCG/5ML IJ SOLN
INTRAMUSCULAR | Status: AC
  Filled 2020-12-12: qty 5

## 2020-12-12 MED ORDER — VITAMIN D 25 MCG (1000 UNIT) PO TABS
2000.0000 [IU] | ORAL_TABLET | Freq: Two times a day (BID) | ORAL | Status: DC
  Administered 2020-12-12: 2000 [IU] via ORAL
  Filled 2020-12-12: qty 2

## 2020-12-12 MED ORDER — PROPOFOL 10 MG/ML IV BOLUS
INTRAVENOUS | Status: AC
  Filled 2020-12-12: qty 40

## 2020-12-12 MED ORDER — SODIUM CHLORIDE 0.9% FLUSH
3.0000 mL | Freq: Two times a day (BID) | INTRAVENOUS | Status: DC
  Administered 2020-12-13 – 2020-12-18 (×9): 3 mL via INTRAVENOUS
  Administered 2020-12-18 – 2020-12-19 (×2): 10 mL via INTRAVENOUS
  Administered 2020-12-19 – 2021-01-07 (×28): 3 mL via INTRAVENOUS

## 2020-12-12 MED ORDER — ROCURONIUM BROMIDE 10 MG/ML (PF) SYRINGE
PREFILLED_SYRINGE | INTRAVENOUS | Status: AC
  Filled 2020-12-12: qty 10

## 2020-12-12 MED ORDER — PROMETHAZINE HCL 25 MG/ML IJ SOLN
INTRAMUSCULAR | Status: AC
  Filled 2020-12-12: qty 1

## 2020-12-12 MED ORDER — FENTANYL CITRATE (PF) 100 MCG/2ML IJ SOLN
INTRAMUSCULAR | Status: DC | PRN
  Administered 2020-12-12: 50 ug via INTRAVENOUS
  Administered 2020-12-12 (×2): 100 ug via INTRAVENOUS

## 2020-12-12 MED ORDER — CEFAZOLIN SODIUM-DEXTROSE 2-4 GM/100ML-% IV SOLN
2.0000 g | Freq: Three times a day (TID) | INTRAVENOUS | Status: DC
  Administered 2020-12-12: 2 g via INTRAVENOUS
  Filled 2020-12-12 (×3): qty 100

## 2020-12-12 MED ORDER — SERTRALINE HCL 50 MG PO TABS: 100.0000 mg | ORAL_TABLET | Freq: Every day | ORAL | Status: DC

## 2020-12-12 MED ORDER — MIDAZOLAM HCL 5 MG/5ML IJ SOLN
INTRAMUSCULAR | Status: DC | PRN
  Administered 2020-12-12: 2 mg via INTRAVENOUS

## 2020-12-12 MED ORDER — CHLORHEXIDINE GLUCONATE CLOTH 2 % EX PADS: 6.0000 | MEDICATED_PAD | Freq: Every day | CUTANEOUS | Status: DC

## 2020-12-12 MED ORDER — QUETIAPINE FUMARATE 200 MG PO TABS
200.0000 mg | ORAL_TABLET | Freq: Every day | ORAL | Status: DC
  Filled 2020-12-12 (×2): qty 1

## 2020-12-12 MED ORDER — MIDAZOLAM HCL 2 MG/2ML IJ SOLN
INTRAMUSCULAR | Status: AC
  Filled 2020-12-12: qty 2

## 2020-12-12 SURGICAL SUPPLY — 88 items
ADH SKN CLS APL DERMABOND .7 (GAUZE/BANDAGES/DRESSINGS) ×2
BAG COUNTER SPONGE SURGICOUNT (BAG) ×4 IMPLANT
BAG SPNG CNTER NS LX DISP (BAG) ×4
BAND INSRT 18 STRL LF DISP RB (MISCELLANEOUS)
BAND RUBBER #18 3X1/16 STRL (MISCELLANEOUS) IMPLANT
BASKET BONE COLLECTION (BASKET) ×2 IMPLANT
BATTALION LLIF ITRADISCAL SHIM (MISCELLANEOUS) ×3
BLADE CLIPPER SURG (BLADE) IMPLANT
BNDG COHESIVE 2X5 TAN ST LF (GAUZE/BANDAGES/DRESSINGS) ×1 IMPLANT
BUR MATCHSTICK NEURO 3.0 LAGG (BURR) ×2 IMPLANT
BUR PRECISION FLUTE 5.0 (BURR) ×2 IMPLANT
CANISTER SUCT 3000ML PPV (MISCELLANEOUS) ×3 IMPLANT
CATH COUDE FOLEY 2W 5CC 16FR (CATHETERS) ×1 IMPLANT
CATH COUDE FOLEY 5CC 14FR (CATHETERS) ×1 IMPLANT
CATH MUSHROOM 14FR (CATHETERS) IMPLANT
CLIP SPRING STIM LLIF SAFEOP (CLIP) ×1 IMPLANT
CNTNR URN SCR LID CUP LEK RST (MISCELLANEOUS) ×2 IMPLANT
CONT SPEC 4OZ STRL OR WHT (MISCELLANEOUS) ×3
COVER BACK TABLE 60X90IN (DRAPES) ×3 IMPLANT
DECANTER SPIKE VIAL GLASS SM (MISCELLANEOUS) ×2 IMPLANT
DERMABOND ADVANCED (GAUZE/BANDAGES/DRESSINGS) ×1
DERMABOND ADVANCED .7 DNX12 (GAUZE/BANDAGES/DRESSINGS) ×4 IMPLANT
DILATOR INSULATED LLIF 8-13-18 (NEUROSURGERY SUPPLIES) ×1 IMPLANT
DRAIN JACKSON PRATT 10MM FLAT (MISCELLANEOUS) IMPLANT
DRAPE 3/4 80X56 (DRAPES) ×3 IMPLANT
DRAPE C-ARM 42X72 X-RAY (DRAPES) ×3 IMPLANT
DRAPE C-ARMOR (DRAPES) ×3 IMPLANT
DRAPE LAPAROTOMY 100X72X124 (DRAPES) ×3 IMPLANT
DRAPE MICROSCOPE LEICA (MISCELLANEOUS) IMPLANT
DRSG OPSITE POSTOP 4X6 (GAUZE/BANDAGES/DRESSINGS) IMPLANT
DURAPREP 26ML APPLICATOR (WOUND CARE) ×3 IMPLANT
ELECT BLADE INSULATED 6.5IN (ELECTROSURGICAL) ×3
ELECT COATED BLADE 2.86 ST (ELECTRODE) ×1 IMPLANT
ELECT KIT SAFEOP SSEP/SURF (KITS) ×3
ELECT REM PT RETURN 9FT ADLT (ELECTROSURGICAL) ×3
ELECTRODE BLDE INSULATED 6.5IN (ELECTROSURGICAL) ×2 IMPLANT
ELECTRODE KT SAFEOP SSEP/SURF (KITS) IMPLANT
ELECTRODE REM PT RTRN 9FT ADLT (ELECTROSURGICAL) ×2 IMPLANT
EVACUATOR SILICONE 100CC (DRAIN) IMPLANT
GAUZE 4X4 16PLY ~~LOC~~+RFID DBL (SPONGE) ×1 IMPLANT
GAUZE SPONGE 4X4 12PLY STRL (GAUZE/BANDAGES/DRESSINGS) IMPLANT
GLOVE EXAM NITRILE XL STR (GLOVE) IMPLANT
GLOVE SURG LTX SZ7.5 (GLOVE) ×6 IMPLANT
GLOVE SURG UNDER POLY LF SZ7.5 (GLOVE) ×3 IMPLANT
GOWN STRL REUS W/ TWL LRG LVL3 (GOWN DISPOSABLE) ×2 IMPLANT
GOWN STRL REUS W/ TWL XL LVL3 (GOWN DISPOSABLE) ×2 IMPLANT
GOWN STRL REUS W/TWL 2XL LVL3 (GOWN DISPOSABLE) IMPLANT
GOWN STRL REUS W/TWL LRG LVL3 (GOWN DISPOSABLE) ×3
GOWN STRL REUS W/TWL XL LVL3 (GOWN DISPOSABLE) ×3
GUIDEWIRE LLIF TT 320 (WIRE) ×2 IMPLANT
HEMOSTAT POWDER KIT SURGIFOAM (HEMOSTASIS) ×3 IMPLANT
KIT BASIN OR (CUSTOM PROCEDURE TRAY) ×3 IMPLANT
KIT INFUSE MEDIUM (Orthopedic Implant) ×1 IMPLANT
KIT TURNOVER KIT B (KITS) ×3 IMPLANT
KNIFE ANNULOTOMY GREY RETRACT (ORTHOPEDIC DISPOSABLE SUPPLIES) ×1 IMPLANT
LIF ILLUMINATION SYSTEM STERIL (SYSTAGENIX WOUND MANAGEMENT) ×3
MATRIX SPINE STRIP NEOCORE 5CC (Putty) IMPLANT
MILL MEDIUM DISP (BLADE) ×2 IMPLANT
NDL HYPO 18GX1.5 BLUNT FILL (NEEDLE) IMPLANT
NDL HYPO 21X1.5 SAFETY (NEEDLE) ×2 IMPLANT
NDL SPNL 18GX3.5 QUINCKE PK (NEEDLE) IMPLANT
NEEDLE HYPO 18GX1.5 BLUNT FILL (NEEDLE) IMPLANT
NEEDLE HYPO 21X1.5 SAFETY (NEEDLE) ×3 IMPLANT
NEEDLE HYPO 22GX1.5 SAFETY (NEEDLE) ×3 IMPLANT
NEEDLE SPNL 18GX3.5 QUINCKE PK (NEEDLE) IMPLANT
NS IRRIG 1000ML POUR BTL (IV SOLUTION) ×3 IMPLANT
PACK LAMINECTOMY NEURO (CUSTOM PROCEDURE TRAY) ×3 IMPLANT
PAD ARMBOARD 7.5X6 YLW CONV (MISCELLANEOUS) ×10 IMPLANT
PROBE BALL TIP LLIF SAFEOP (NEUROSURGERY SUPPLIES) ×1 IMPLANT
SHIM ITRADISCAL BATTALION LLIF (MISCELLANEOUS) IMPLANT
SPACER IDENTITI 10X18X55 10D (Spacer) ×1 IMPLANT
SPACER LIF IDENTITI 8X18X60 10 (Spacer) ×3 IMPLANT
SPONGE SURGIFOAM ABS GEL 100 (HEMOSTASIS) IMPLANT
SPONGE T-LAP 4X18 ~~LOC~~+RFID (SPONGE) ×2 IMPLANT
STAPLER VISISTAT 35W (STAPLE) ×3 IMPLANT
STRIP MATRIX NEOCORE 20CC (Putty) ×1 IMPLANT
STRIP MATRIX NEOCORE 5CC (Putty) ×1 IMPLANT
SUT MNCRL AB 3-0 PS2 18 (SUTURE) ×3 IMPLANT
SUT MNCRL AB 4-0 PS2 18 (SUTURE) ×1 IMPLANT
SUT VIC AB 0 CT1 18XCR BRD8 (SUTURE) ×2 IMPLANT
SUT VIC AB 0 CT1 8-18 (SUTURE) ×3
SUT VIC AB 2-0 CP2 18 (SUTURE) ×4 IMPLANT
SYR 30ML LL (SYRINGE) ×3 IMPLANT
SYSTEM ILLUMINATION LIF STERIL (SYSTAGENIX WOUND MANAGEMENT) IMPLANT
TOWEL GREEN STERILE (TOWEL DISPOSABLE) ×3 IMPLANT
TOWEL GREEN STERILE FF (TOWEL DISPOSABLE) ×3 IMPLANT
TRAY FOLEY MTR SLVR 16FR STAT (SET/KITS/TRAYS/PACK) ×4 IMPLANT
WATER STERILE IRR 1000ML POUR (IV SOLUTION) ×3 IMPLANT

## 2020-12-12 NOTE — Transfer of Care (Signed)
Immediate Anesthesia Transfer of Care Note  Patient: AUGUSTO BUCKO  Procedure(s) Performed: Anterior Lateral Interbody Fusion Lumbar One-Two, Lumbar Two-Three, Lumbar Three-Four, Lumbar Four-Five CYSTOSCOPY, URETHRAL DILATION, DIFFICULT FOLEY INSERTION  Patient Location: PACU  Anesthesia Type:General  Level of Consciousness: awake, alert  and drowsy  Airway & Oxygen Therapy: Patient Spontanous Breathing and Patient connected to face mask oxygen  Post-op Assessment: Report given to RN, Post -op Vital signs reviewed and stable and Patient moving all extremities X 4  Post vital signs: Reviewed and stable  Last Vitals:  Vitals Value Taken Time  BP 142/84 12/12/20 1534  Temp    Pulse 71 12/12/20 1536  Resp 21 12/12/20 1536  SpO2 100 % 12/12/20 1536  Vitals shown include unvalidated device data.  Last Pain:  Vitals:   12/12/20 0712  TempSrc:   PainSc: 7       Patients Stated Pain Goal: 3 ([id number])  Complications: No notable events documented.
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
## 2020-12-12 NOTE — Op Note (Addendum)
Procedure(s): Anterior Lateral Interbody Fusion Lumbar One-Two, Lumbar Two-Three, Lumbar Three-Four, Lumbar Four-Five CYSTOSCOPY, URETHRAL DILATION, DIFFICULT FOLEY INSERTION Procedure Note  Keith Dixon male 60 y.o. 12/12/2020  Procedure(s) and Anesthesia Type:    * Anterior Lateral Interbody Fusion Lumbar One-Two, Lumbar Two-Three, Lumbar Three-Four, Lumbar Four-Five - General    * CYSTOSCOPY, URETHRAL DILATION, DIFFICULT FOLEY INSERTION  Surgeon(s) and Role:    * Franchot Gallo, MD - Primary    * Newman Pies, MD - Assisting    * Marcello Moores Dorcas Carrow, MD   Indications:  This is a 60 year old man who presented with progressive severe back pain and right greater than left leg pain.  It had progressed to the point where he was unable to walk and was essentially wheelchair-bound.  Imaging showed severe progression of degenerative disease, with severe stenosis from L1-L5 centrally.  He had severe coronal deformity, including chronic wedge-shaped remodeling of his L3 body, and had severe sagittal deformity with lumbar kyphosis.  As he was functionally severely debilitated with fairly rapid progression and nonsurgical measures had failed to provide any palliation, I discussed with him surgical options.  I recommended more extensive spinal reconstruction given his severe deformity and the severity of his symptoms.  This would entail stage I lateral interbodies at L1-2, L2-3, L3-4, and L4-5, with a second stage posterior decompression and fusion from T10 to pelvis.  Risk, benefits, alternatives, and expected convalescence were discussed with him.  Risk discussed included, but were not limited to, bleeding, pain, infection, scar, damage to nearby organs, instability, pseudoarthrosis, adjacent segment disease, neurologic deficit, spinal fluid leak, and death.  Informed consent was obtained.     Surgeon: Vallarie Mare   Assistants: Newman Pies, MD.  Please note there were no qualified  trainees available to assist with the procedure.  Anesthesia: General endotracheal anesthesia   Procedure Detail  Anterior Lateral Interbody Fusion Lumbar One-Two, Lumbar Two-Three, Lumbar Three-Four, Lumbar Four-Five, CYSTOSCOPY, URETHRAL DILATION, DIFFICULT FOLEY INSERTION  Direct lateral interbody fusion L4-5 via anterior/retroperitoneal approach with Alpha-Tec retractor Direct lateral interbody fusion L3-4 via anterior/retroperitoneal approach with Alpha-Tec retractor Direct lateral interbody fusion L2-3 via anterior/retroperitoneal approach with Alpha-Tec retractor Direct lateral interbody fusion L1-2 via anterior/retroperitoneal approach with Alpha-Tec retractor Harvest of local rib autograft Use of morselized allograft and osteopromotive material  The patient is brought to the room.  After appropriate lines and monitors placed, general anesthesia was induced and patient was intubated by the anesthesia service.  Unfortunately, the nurses were not able to place a Foley catheter.  I attempted to place the catheter with a smaller coud catheter and did not feel any significant resistance but there was a small amount of urethral meatal bleeding noted so urology was consulted. After Dr. Diona Fanti arrived, he performed cystoscopy and found a urethral stricture and performed dilation and placement of Foley catheter.  Following this, patient was then positioned in the lateral position with the right side up.  He was secured to the bed and x-ray was used to ascertain positioning.  His flank was preprepped with alcohol and prepped and draped in sterile fashion.  1% lidocaine with epinephrine was injected into the planned incision spanning L1 L5 disc spaces.  A timeout was performed and preoperative antibiotics were administered.  Incision was made with a 10 blade and the subcutaneous tissue was divided.  The fascia over the external obliques was opened and blunt dissection through the external obliques,  internal obliques, and transversalis fascia was performed until the retroperitoneal  space was reached.  The retroperitoneal contents were dissected off of the iliac crest, quadratus, and the psoas and reflected anteriorly.  Initial dilator was then placed over the L4-5 disc space as confirmed on x-ray.  The dilator was stimulated and no nearby nerves were found.  The drill was then used to passed through the psoas muscle, docking on the posterior third of the L4-5 disc space.  Nerves were identified posterior to the dilator on stimulation.  Subsequently size dilators were placed and stimulated and finally a tubular retractor was placed.  The posterior blade and the field was stimulated and no nerves were identified, except a nerve in the posterior inferior corner of the field which was protected.  Shim blade was used to secure the retractor in the disc space and the retractor was opened to allow greater working area.  Annulotomy was performed within 15 blade and the discectomy was performed with rongeurs and curettes and box cutters.  Increasing size trials were used until a size 8 by 11 x 18 x 60 implant was selected and placed under AP and lateral fluoroscopic guidance.  Good reduction of coronal deformity and sagittal deformity was accomplished with this.  The retractor was then withdrawn and no significant bleeding was identified.    Attention was then turned to the L1-2 level.  The 12th rib was identified and the neurovascular bundle and intercostal muscle was dissected out from the rib.  The anterior aspect of the rib was then resected and harvested for autograft to allow for good docking at L1-2.  Lateral aspect and attachments of the diaphragm were identified and inferior to this, the retroperitoneal space was entered and contents were reflected anteriorly.  The initial dilator was placed over the L1-2 disc space under lateral x-ray guidance.  It was then passed through the psoas muscle and docked on the  L1-2 disc base under x-ray guidance.  Subsequent dilators were placed until a tubular retractor was placed and secured to the bed, with x-ray confirming good positioning.  Stimulation revealed no nerves in proximity.  Shim was placed in the posterior blade and the retractor was opened. Annulotomy was performed within 15 blade and the discectomy was performed with rongeurs and curettes and box cutters.  Increasing size trials were used until a size 8 by 11 x 18 x 60 implant was selected and placed under AP and lateral fluoroscopic guidance.  Meticulous hemostasis was obtained.  The retractor was then removed and no bleeding was noted.  Attention was then turned to the L2-3 level.  The same retroperitoneal entry as L1-2 was chosen for this level.  The initial dilator was placed over the L2-3 disc space under lateral x-ray guidance.  It was then passed through the psoas muscle and docked on the disc base under x-ray guidance.  Subsequent dilators were placed until a tubular retractor was placed and secured to the bed, with x-ray confirming good positioning.  Stimulation revealed no nerves in proximity.  Shim was placed in the posterior blade and the retractor was opened. Annulotomy was performed within 15 blade and the discectomy was performed with rongeurs and curettes and box cutters.  The disc was extremely soft and although no purulence was found, I decided to send this disc for pathology and for culture.  increasing size trials were used until a size 10 by 13 x 18 x 55 implant was selected and placed under AP and lateral fluoroscopic guidance.  The L3 body and had undergone significant chronic  remodeling and was wedge-shaped in the coronal plane.  Meticulous hemostasis was obtained.  The retractor was then removed and no bleeding was noted.  Attention was then turned to the L3-4 level.  The retroperitoneal exposure from the L4-5 disc was used.  The retroperitoneal space was entered and contents were reflected  anteriorly.  The initial dilator was placed over the L3-4 disc space under lateral x-ray guidance.  It was then passed through the psoas muscle and docked on the L3-4 disc base under x-ray guidance.  subsequent dilators were placed until a tubular retractor was placed and secured to the bed, with x-ray confirming good positioning.  Each dilator was stimulated and no nerves in proximity were identified.  This was fairly difficult as there was a large right lateral osteophyte which made docking tedious.  Stimulation of the field and of the posterior blade revealed no nerves in proximity.  Shim was placed in the posterior blade and the retractor was opened. Annulotomy was performed within 15 blade and the discectomy was performed with rongeurs and curettes and box cutters.  Increasing size trials were used until a size 8 by 11 x 18 x 60 implant was selected and placed under AP and lateral fluoroscopic guidance.  Meticulous hemostasis was obtained.  The retractor was then removed and no bleeding was noted.  The wound was then irrigated thoroughly.  Meticulous hemostasis was obtained.  Final x-rays were obtained.  The fascia was closed with 0 Vicryl stitches.  The dermal layer was closed with 2-0 Vicryl stitches in buried interrupted fashion.  Skin was closed with 4 Monocryl in subcuticular manner followed by Dermabond.  Patient was then flipped supine extubated by the anesthesia service following commands.  All counts were correct at the end of surgery.  No complications were noted.  Findings: Successful interbody fusion with good reduction of sagittal and coronal deformity  Estimated Blood Loss:  less than 100 mL         Drains: none         Total IV Fluids: see anesthesia records  Blood Given: none          Specimens: L2-3 disc sent for pathology and culture         Implants:  AlphaTec L1-2: 8ph x 11ah x 18 x 60 mm cage with 10 degree lordosis L2-3: 10ph x 13ah x 18 x 55 mm cage with 10 degree  lordosis L3-4: 8ph x 11ah x 18 x 60 mm cage with 10 degree lordosis L4-5: 8ph x 11ah x 18 x 60 mm cage with 10 degree lordosis        Complications:  * No complications entered in OR log *         Disposition: PACU - hemodynamically stable.         Condition: stable

## 2020-12-12 NOTE — Anesthesia Procedure Notes (Signed)
Procedure Name: Intubation Date/Time: 12/12/2020 8:49 AM Performed by: Annamary Carolin, CRNA Pre-anesthesia Checklist: Patient identified, Emergency Drugs available, Suction available and Patient being monitored Patient Re-evaluated:Patient Re-evaluated prior to induction Oxygen Delivery Method: Circle System Utilized Preoxygenation: Pre-oxygenation with 100% oxygen Induction Type: IV induction Ventilation: Mask ventilation without difficulty Laryngoscope Size: Miller and 2 (MAC 3 no view x1) Grade View: Grade II Tube type: Oral Tube size: 8.0 mm Number of attempts: 2 Airway Equipment and Method: Stylet and Oral airway Placement Confirmation: ETT inserted through vocal cords under direct vision, positive ETCO2 and breath sounds checked- equal and bilateral Secured at: 26 cm Tube secured with: Tape Dental Injury: Teeth and Oropharynx as per pre-operative assessment

## 2020-12-12 NOTE — Progress Notes (Signed)
Received patient from PACU, assessment completed, VS documented, oriented patient to the room.  Will continue to monitor.

## 2020-12-12 NOTE — Anesthesia Preprocedure Evaluation (Addendum)
Anesthesia Evaluation  Patient identified by MRN, date of birth, ID band Patient awake  General Assessment Comment:Preop patient was awake and responding to questions. Dr. Marcello Moores confirmed preop that his mental status was unchanged from post op yesterday and will proceed with surgery Dr. Nyoka Cowden  Reviewed: Allergy & Precautions, NPO status , Patient's Chart, lab work & pertinent test results  Airway Mallampati: II  TM Distance: >3 FB     Dental   Pulmonary sleep apnea , Current Smoker and Patient abstained from smoking.,    breath sounds clear to auscultation       Cardiovascular hypertension, + dysrhythmias  Rhythm:Regular Rate:Normal     Neuro/Psych Seizures -,     GI/Hepatic negative GI ROS, Neg liver ROS,   Endo/Other    Renal/GU negative Renal ROS     Musculoskeletal  (+) Arthritis ,   Abdominal   Peds  Hematology   Anesthesia Other Findings   Reproductive/Obstetrics                          Anesthesia Physical Anesthesia Plan  ASA: 3  Anesthesia Plan: General   Post-op Pain Management:    Induction: Intravenous  PONV Risk Score and Plan: 2 and Ondansetron and Dexamethasone  Airway Management Planned: Oral ETT  Additional Equipment:   Intra-op Plan:   Post-operative Plan: Possible Post-op intubation/ventilation  Informed Consent: I have reviewed the patients History and Physical, chart, labs and discussed the procedure including the risks, benefits and alternatives for the proposed anesthesia with the patient or authorized representative who has indicated his/her understanding and acceptance.     Dental advisory given  Plan Discussed with: Anesthesiologist and CRNA  Anesthesia Plan Comments:        Anesthesia Quick Evaluation

## 2020-12-12 NOTE — Anesthesia Postprocedure Evaluation (Signed)
Anesthesia Post Note  Patient: GUSTIN EKDAHL  Procedure(s) Performed: Anterior Lateral Interbody Fusion Lumbar One-Two, Lumbar Two-Three, Lumbar Three-Four, Lumbar Four-Five CYSTOSCOPY, URETHRAL DILATION, DIFFICULT FOLEY INSERTION     Patient location during evaluation: PACU Anesthesia Type: General Level of consciousness: awake and alert and oriented Pain management: pain level controlled Vital Signs Assessment: post-procedure vital signs reviewed and stable Respiratory status: spontaneous breathing, nonlabored ventilation and respiratory function stable Cardiovascular status: blood pressure returned to baseline Postop Assessment: no apparent nausea or vomiting Anesthetic complications: no   No notable events documented.  Last Vitals:  Vitals:   12/12/20 1618 12/12/20 1633  BP: (!) 144/88 (!) 142/75  Pulse: 87 85  Resp: 17 12  Temp:  36.4 C  SpO2: 100% 100%    Last Pain:  Vitals:   12/12/20 1633  TempSrc:   PainSc: [place]

## 2020-12-13 ENCOUNTER — Inpatient Hospital Stay (HOSPITAL_COMMUNITY)
Admission: RE | Disposition: A | Discharge status: Skilled Nursing Facility | Payer: Self-pay | Source: Home / Self Care | Attending: Neurosurgery

## 2020-12-13 ENCOUNTER — Inpatient Hospital Stay (HOSPITAL_COMMUNITY): Payer: No Typology Code available for payment source

## 2020-12-13 ENCOUNTER — Inpatient Hospital Stay (HOSPITAL_COMMUNITY): Payer: No Typology Code available for payment source | Admitting: Anesthesiology

## 2020-12-13 ENCOUNTER — Encounter (HOSPITAL_COMMUNITY): Payer: Self-pay | Admitting: Urology

## 2020-12-13 ENCOUNTER — Inpatient Hospital Stay (HOSPITAL_COMMUNITY): Admission: RE | Admit: 2020-12-13 | Payer: No Typology Code available for payment source | Source: Home / Self Care

## 2020-12-13 DIAGNOSIS — Z9911 Dependence on respirator [ventilator] status: Secondary | ICD-10-CM

## 2020-12-13 DIAGNOSIS — J9601 Acute respiratory failure with hypoxia: Secondary | ICD-10-CM | POA: Diagnosis not present

## 2020-12-13 DIAGNOSIS — N170 Acute kidney failure with tubular necrosis: Secondary | ICD-10-CM | POA: Diagnosis not present

## 2020-12-13 DIAGNOSIS — Z20822 Contact with and (suspected) exposure to covid-19: Secondary | ICD-10-CM | POA: Diagnosis not present

## 2020-12-13 DIAGNOSIS — J9602 Acute respiratory failure with hypercapnia: Secondary | ICD-10-CM | POA: Diagnosis not present

## 2020-12-13 DIAGNOSIS — M48062 Spinal stenosis, lumbar region with neurogenic claudication: Secondary | ICD-10-CM | POA: Diagnosis not present

## 2020-12-13 DIAGNOSIS — G928 Other toxic encephalopathy: Secondary | ICD-10-CM | POA: Diagnosis not present

## 2020-12-13 DIAGNOSIS — M48061 Spinal stenosis, lumbar region without neurogenic claudication: Secondary | ICD-10-CM | POA: Diagnosis present

## 2020-12-13 HISTORY — PX: APPLICATION OF INTRAOPERATIVE CT SCAN: SHX6668

## 2020-12-13 HISTORY — PX: POSTERIOR LUMBAR FUSION 4 LEVEL: SHX6037

## 2020-12-13 LAB — COMPREHENSIVE METABOLIC PANEL
ALT: 21 U/L (ref 0–44)
AST: 45 U/L — ABNORMAL HIGH (ref 15–41)
Albumin: 4 g/dL (ref 3.5–5.0)
Alkaline Phosphatase: 78 U/L (ref 38–126)
Anion gap: 11 (ref 5–15)
BUN: 10 mg/dL (ref 6–20)
CO2: 22 mmol/L (ref 22–32)
Calcium: 9.5 mg/dL (ref 8.9–10.3)
Chloride: 101 mmol/L (ref 98–111)
Creatinine, Ser: 1.05 mg/dL (ref 0.61–1.24)
GFR, Estimated: >60 mL/min (ref >60)
Glucose, Bld: 116 mg/dL — ABNORMAL HIGH (ref 70–99)
Potassium: 3.5 mmol/L (ref 3.5–5.1)
Sodium: 134 mmol/L — ABNORMAL LOW (ref 135–145)
Total Bilirubin: 1.6 mg/dL — ABNORMAL HIGH (ref 0.3–1.2)
Total Protein: 7 g/dL (ref 6.5–8.1)

## 2020-12-13 LAB — CBC
HCT: 35 % — ABNORMAL LOW (ref 39.0–52.0)
Hemoglobin: 11.4 g/dL — ABNORMAL LOW (ref 13.0–17.0)
MCH: 27.9 pg (ref 26.0–34.0)
MCHC: 32.6 g/dL (ref 30.0–36.0)
MCV: 85.8 fL (ref 80.0–100.0)
Platelets: 152 10*3/uL (ref 150–400)
RBC: 4.08 MIL/uL — ABNORMAL LOW (ref 4.22–5.81)
RDW: 15.9 % — ABNORMAL HIGH (ref 11.5–15.5)
WBC: 12.1 10*3/uL — ABNORMAL HIGH (ref 4.0–10.5)
nRBC: 0 % (ref 0.0–0.2)

## 2020-12-13 LAB — POCT I-STAT EG7
Acid-base deficit: 3 mmol/L — ABNORMAL HIGH (ref 0.0–2.0)
Acid-base deficit: 3 mmol/L — ABNORMAL HIGH (ref 0.0–2.0)
Bicarbonate: 24.1 mmol/L (ref 20.0–28.0)
Bicarbonate: 23.9 mmol/L (ref 20.0–28.0)
Calcium, Ion: 1.12 mmol/L — ABNORMAL LOW (ref 1.15–1.40)
Calcium, Ion: 1.03 mmol/L — ABNORMAL LOW (ref 1.15–1.40)
HCT: 30 % — ABNORMAL LOW (ref 39.0–52.0)
HCT: 29 % — ABNORMAL LOW (ref 39.0–52.0)
Hemoglobin: 10.2 g/dL — ABNORMAL LOW (ref 13.0–17.0)
Hemoglobin: 9.9 g/dL — ABNORMAL LOW (ref 13.0–17.0)
O2 Saturation: 89 %
O2 Saturation: 86 %
Patient temperature: 37
Patient temperature: 37
Potassium: 4.2 mmol/L (ref 3.5–5.1)
Potassium: 4.4 mmol/L (ref 3.5–5.1)
Sodium: 137 mmol/L (ref 135–145)
Sodium: 138 mmol/L (ref 135–145)
TCO2: 26 mmol/L (ref 22–32)
TCO2: 25 mmol/L (ref 22–32)
pCO2, Ven: 51.2 mmHg (ref 44.0–60.0)
pCO2, Ven: 48 mmHg (ref 44.0–60.0)
pH, Ven: 7.28 (ref 7.250–7.430)
pH, Ven: 7.306 (ref 7.250–7.430)
pO2, Ven: 64 mmHg — ABNORMAL HIGH (ref 32.0–45.0)
pO2, Ven: 57 mmHg — ABNORMAL HIGH (ref 32.0–45.0)

## 2020-12-13 LAB — PREPARE RBC (CROSSMATCH)

## 2020-12-13 LAB — CBC WITH DIFFERENTIAL/PLATELET
Abs Immature Granulocytes: 0.07 10*3/uL (ref 0.00–0.07)
Basophils Absolute: 0 10*3/uL (ref 0.0–0.1)
Basophils Relative: 0 %
Eosinophils Absolute: 0 10*3/uL (ref 0.0–0.5)
Eosinophils Relative: 0 %
HCT: 40.8 % (ref 39.0–52.0)
Hemoglobin: 13.4 g/dL (ref 13.0–17.0)
Immature Granulocytes: 1 %
Lymphocytes Relative: 8 %
Lymphs Abs: 1 10*3/uL (ref 0.7–4.0)
MCH: 26.6 pg (ref 26.0–34.0)
MCHC: 32.8 g/dL (ref 30.0–36.0)
MCV: 81 fL (ref 80.0–100.0)
Monocytes Absolute: 1 10*3/uL (ref 0.1–1.0)
Monocytes Relative: 8 %
Neutro Abs: 11.1 10*3/uL — ABNORMAL HIGH (ref 1.7–7.7)
Neutrophils Relative %: 83 %
Platelets: 222 10*3/uL (ref 150–400)
RBC: 5.04 MIL/uL (ref 4.22–5.81)
RDW: 15.1 % (ref 11.5–15.5)
WBC: 13.2 10*3/uL — ABNORMAL HIGH (ref 4.0–10.5)
nRBC: 0 % (ref 0.0–0.2)

## 2020-12-13 LAB — POCT I-STAT, CHEM 8
BUN: 17 mg/dL (ref 6–20)
Calcium, Ion: 0.99 mmol/L — ABNORMAL LOW (ref 1.15–1.40)
Chloride: 103 mmol/L (ref 98–111)
Creatinine, Ser: 1.5 mg/dL — ABNORMAL HIGH (ref 0.61–1.24)
Glucose, Bld: 150 mg/dL — ABNORMAL HIGH (ref 70–99)
HCT: 34 % — ABNORMAL LOW (ref 39.0–52.0)
Hemoglobin: 11.6 g/dL — ABNORMAL LOW (ref 13.0–17.0)
Potassium: 5.1 mmol/L (ref 3.5–5.1)
Sodium: 135 mmol/L (ref 135–145)
TCO2: 24 mmol/L (ref 22–32)

## 2020-12-13 LAB — SURGICAL PATHOLOGY

## 2020-12-13 SURGERY — POSTERIOR LUMBAR FUSION 4 LEVEL
Anesthesia: General | Site: Back

## 2020-12-13 MED ORDER — CHLORHEXIDINE GLUCONATE 0.12% ORAL RINSE (MEDLINE KIT)
15.0000 mL | Freq: Two times a day (BID) | OROMUCOSAL | Status: DC
Start: 2020-12-13 — End: 2020-12-29
  Administered 2020-12-13 – 2020-12-29 (×31): 15 mL via OROMUCOSAL

## 2020-12-13 MED ORDER — ONDANSETRON HCL 4 MG/2ML IJ SOLN
INTRAMUSCULAR | Status: DC | PRN
Start: 2020-12-13 — End: 2020-12-13
  Administered 2020-12-13: 4 mg via INTRAVENOUS

## 2020-12-13 MED ORDER — LIDOCAINE 2% (20 MG/ML) 5 ML SYRINGE
INTRAMUSCULAR | Status: DC | PRN
  Administered 2020-12-13: 100 mg via INTRAVENOUS

## 2020-12-13 MED ORDER — ENOXAPARIN SODIUM 40 MG/0.4ML IJ SOSY: 40.0000 mg | PREFILLED_SYRINGE | INTRAMUSCULAR | Status: DC

## 2020-12-13 MED ORDER — ALBUMIN HUMAN 5 % IV SOLN: INTRAVENOUS | Status: DC | PRN

## 2020-12-13 MED ORDER — HYDROMORPHONE HCL 1 MG/ML IJ SOLN
INTRAMUSCULAR | Status: DC | PRN
  Administered 2020-12-13: .5 mg via INTRAVENOUS

## 2020-12-13 MED ORDER — PROPOFOL 10 MG/ML IV BOLUS
INTRAVENOUS | Status: DC | PRN
  Administered 2020-12-13: 200 mg via INTRAVENOUS

## 2020-12-13 MED ORDER — MIDAZOLAM HCL 2 MG/2ML IJ SOLN
INTRAMUSCULAR | Status: DC | PRN
  Administered 2020-12-13 (×2): 1 mg via INTRAVENOUS

## 2020-12-13 MED ORDER — VITAMIN D 25 MCG (1000 UNIT) PO TABS
2000.0000 [IU] | ORAL_TABLET | Freq: Two times a day (BID) | ORAL | Status: DC
  Administered 2020-12-14 – 2020-12-19 (×11): 2000 [IU]
  Filled 2020-12-13 (×12): qty 2

## 2020-12-13 MED ORDER — NOREPINEPHRINE 4 MG/250ML-% IV SOLN
2.0000 ug/min | INTRAVENOUS | Status: DC
  Administered 2020-12-13: 2 ug/min via INTRAVENOUS
  Administered 2020-12-14: 9 ug/min via INTRAVENOUS
  Administered 2020-12-14: 7 ug/min via INTRAVENOUS
  Administered 2020-12-14: 9 ug/min via INTRAVENOUS
  Filled 2020-12-13 (×2): qty 250

## 2020-12-13 MED ORDER — ACETAMINOPHEN 650 MG RE SUPP: 650.0000 mg | RECTAL | Status: DC | PRN

## 2020-12-13 MED ORDER — LIDOCAINE-EPINEPHRINE 1 %-1:100000 IJ SOLN
INTRAMUSCULAR | Status: DC | PRN
  Administered 2020-12-13: 10 mL

## 2020-12-13 MED ORDER — PHENOL 1.4 % MT LIQD: 1.0000 | OROMUCOSAL | Status: DC | PRN

## 2020-12-13 MED ORDER — CHLORHEXIDINE GLUCONATE CLOTH 2 % EX PADS: 6.0000 | MEDICATED_PAD | Freq: Once | CUTANEOUS | Status: DC

## 2020-12-13 MED ORDER — PROMETHAZINE HCL 25 MG/ML IJ SOLN
25.0000 mg | Freq: Four times a day (QID) | INTRAMUSCULAR | Status: DC | PRN
  Filled 2020-12-13: qty 1

## 2020-12-13 MED ORDER — CYCLOBENZAPRINE HCL 10 MG PO TABS: 10.0000 mg | ORAL_TABLET | Freq: Three times a day (TID) | ORAL | Status: DC | PRN

## 2020-12-13 MED ORDER — SODIUM CHLORIDE 0.9 % IV SOLN
250.0000 mL | INTRAVENOUS | Status: DC
Start: 2020-12-13 — End: 2020-12-27

## 2020-12-13 MED ORDER — TRANEXAMIC ACID 1000 MG/10ML IV SOLN
2000.0000 mg | INTRAVENOUS | Status: DC
  Filled 2020-12-13: qty 20

## 2020-12-13 MED ORDER — OXYCODONE HCL 5 MG PO TABS
5.0000 mg | ORAL_TABLET | ORAL | Status: DC | PRN
Start: 2020-12-13 — End: 2020-12-20

## 2020-12-13 MED ORDER — SODIUM CHLORIDE 0.9% FLUSH: 3.0000 mL | Freq: Two times a day (BID) | INTRAVENOUS | Status: DC

## 2020-12-13 MED ORDER — THROMBIN 5000 UNITS EX SOLR
OROMUCOSAL | Status: DC | PRN
  Administered 2020-12-13 (×3): 5 mL via TOPICAL

## 2020-12-13 MED ORDER — FLEET ENEMA 7-19 GM/118ML RE ENEM: 1.0000 | ENEMA | Freq: Once | RECTAL | Status: DC | PRN

## 2020-12-13 MED ORDER — SUCCINYLCHOLINE CHLORIDE 200 MG/10ML IV SOSY
PREFILLED_SYRINGE | INTRAVENOUS | Status: DC | PRN
  Administered 2020-12-13: 120 mg via INTRAVENOUS

## 2020-12-13 MED ORDER — ORAL CARE MOUTH RINSE
15.0000 mL | OROMUCOSAL | Status: DC
  Administered 2020-12-14 – 2020-12-29 (×150): 15 mL via OROMUCOSAL

## 2020-12-13 MED ORDER — GABAPENTIN 250 MG/5ML PO SOLN
900.0000 mg | Freq: Two times a day (BID) | ORAL | Status: DC
  Administered 2020-12-14: 900 mg
  Filled 2020-12-13 (×2): qty 18

## 2020-12-13 MED ORDER — CEFAZOLIN SODIUM-DEXTROSE 2-4 GM/100ML-% IV SOLN
2.0000 g | INTRAVENOUS | Status: AC
  Administered 2020-12-13 (×3): 2 g via INTRAVENOUS

## 2020-12-13 MED ORDER — LACTATED RINGERS IV SOLN: INTRAVENOUS | Status: DC | PRN

## 2020-12-13 MED ORDER — MENTHOL 3 MG MT LOZG: 1.0000 | LOZENGE | OROMUCOSAL | Status: DC | PRN

## 2020-12-13 MED ORDER — NOREPINEPHRINE 4 MG/250ML-% IV SOLN
INTRAVENOUS | Status: AC
  Administered 2020-12-13: 4 mg
  Filled 2020-12-13: qty 250

## 2020-12-13 MED ORDER — FENTANYL CITRATE (PF) 250 MCG/5ML IJ SOLN
INTRAMUSCULAR | Status: DC | PRN
  Administered 2020-12-13 (×2): 50 ug via INTRAVENOUS
  Administered 2020-12-13 (×2): 25 ug via INTRAVENOUS
  Administered 2020-12-13 (×4): 50 ug via INTRAVENOUS
  Administered 2020-12-13: 25 ug via INTRAVENOUS
  Administered 2020-12-13: 50 ug via INTRAVENOUS
  Administered 2020-12-13: 25 ug via INTRAVENOUS
  Administered 2020-12-13: 50 ug via INTRAVENOUS

## 2020-12-13 MED ORDER — POTASSIUM CHLORIDE IN NACL 20-0.9 MEQ/L-% IV SOLN: INTRAVENOUS | Status: DC

## 2020-12-13 MED ORDER — SODIUM CHLORIDE 0.9 % IV SOLN
250.0000 mL | INTRAVENOUS | Status: DC
  Administered 2020-12-13 – 2020-12-25 (×2): 250 mL via INTRAVENOUS

## 2020-12-13 MED ORDER — POLYETHYLENE GLYCOL 3350 17 G PO PACK: 17.0000 g | PACK | Freq: Every day | ORAL | Status: DC | PRN

## 2020-12-13 MED ORDER — PROPOFOL 1000 MG/100ML IV EMUL
5.0000 ug/kg/min | INTRAVENOUS | Status: DC
  Administered 2020-12-13 – 2020-12-14 (×2): 40 ug/kg/min via INTRAVENOUS
  Administered 2020-12-14: 50 ug/kg/min via INTRAVENOUS
  Administered 2020-12-14: 40 ug/kg/min via INTRAVENOUS
  Filled 2020-12-13 (×3): qty 100

## 2020-12-13 MED ORDER — KETAMINE HCL 10 MG/ML IJ SOLN
INTRAMUSCULAR | Status: DC | PRN
  Administered 2020-12-13: 10 mg via INTRAVENOUS
  Administered 2020-12-13: 20 mg via INTRAVENOUS
  Administered 2020-12-13 (×2): 10 mg via INTRAVENOUS

## 2020-12-13 MED ORDER — TRANEXAMIC ACID 1000 MG/10ML IV SOLN
INTRAVENOUS | Status: DC | PRN
  Administered 2020-12-13: 2000 mg via TOPICAL

## 2020-12-13 MED ORDER — CHLORHEXIDINE GLUCONATE CLOTH 2 % EX PADS
6.0000 | MEDICATED_PAD | Freq: Once | CUTANEOUS | Status: AC
  Administered 2020-12-13: 6 via TOPICAL

## 2020-12-13 MED ORDER — VANCOMYCIN HCL 1000 MG IV SOLR
INTRAVENOUS | Status: DC | PRN
  Administered 2020-12-13: 1000 mg via TOPICAL

## 2020-12-13 MED ORDER — CHLORHEXIDINE GLUCONATE CLOTH 2 % EX PADS
6.0000 | MEDICATED_PAD | Freq: Every day | CUTANEOUS | Status: DC
  Administered 2020-12-13 – 2021-01-16 (×33): 6 via TOPICAL

## 2020-12-13 MED ORDER — VANCOMYCIN HCL 1000 MG IV SOLR
INTRAVENOUS | Status: AC
  Filled 2020-12-13: qty 1000

## 2020-12-13 MED ORDER — PHENYLEPHRINE 40 MCG/ML (10ML) SYRINGE FOR IV PUSH (FOR BLOOD PRESSURE SUPPORT)
PREFILLED_SYRINGE | INTRAVENOUS | Status: DC | PRN
  Administered 2020-12-13 (×2): 40 ug via INTRAVENOUS

## 2020-12-13 MED ORDER — DOCUSATE SODIUM 100 MG PO CAPS: 100.0000 mg | ORAL_CAPSULE | Freq: Two times a day (BID) | ORAL | Status: DC

## 2020-12-13 MED ORDER — ONDANSETRON HCL 4 MG PO TABS: 4.0000 mg | ORAL_TABLET | Freq: Four times a day (QID) | ORAL | Status: DC | PRN

## 2020-12-13 MED ORDER — SODIUM CHLORIDE 0.9% FLUSH: 3.0000 mL | INTRAVENOUS | Status: DC | PRN

## 2020-12-13 MED ORDER — TRANEXAMIC ACID-NACL 1000-0.7 MG/100ML-% IV SOLN
INTRAVENOUS | Status: DC | PRN
  Administered 2020-12-13: 1000 mg via INTRAVENOUS

## 2020-12-13 MED ORDER — ROCURONIUM BROMIDE 10 MG/ML (PF) SYRINGE
PREFILLED_SYRINGE | INTRAVENOUS | Status: DC | PRN
  Administered 2020-12-13: 20 mg via INTRAVENOUS
  Administered 2020-12-13: 30 mg via INTRAVENOUS
  Administered 2020-12-13: 20 mg via INTRAVENOUS
  Administered 2020-12-13 (×2): 50 mg via INTRAVENOUS
  Administered 2020-12-13 (×2): 20 mg via INTRAVENOUS
  Administered 2020-12-13: 50 mg via INTRAVENOUS
  Administered 2020-12-13 (×2): 20 mg via INTRAVENOUS

## 2020-12-13 MED ORDER — OXYCODONE HCL 5 MG PO TABS: 10.0000 mg | ORAL_TABLET | ORAL | Status: DC | PRN

## 2020-12-13 MED ORDER — ONDANSETRON HCL 4 MG/2ML IJ SOLN: 4.0000 mg | Freq: Four times a day (QID) | INTRAMUSCULAR | Status: DC | PRN

## 2020-12-13 MED ORDER — PHENYLEPHRINE HCL-NACL 20-0.9 MG/250ML-% IV SOLN
INTRAVENOUS | Status: DC | PRN
  Administered 2020-12-13: 25 ug/min via INTRAVENOUS

## 2020-12-13 MED ORDER — CEFAZOLIN SODIUM-DEXTROSE 1-4 GM/50ML-% IV SOLN
1.0000 g | Freq: Three times a day (TID) | INTRAVENOUS | Status: DC
  Administered 2020-12-14 – 2020-12-15 (×5): 1 g via INTRAVENOUS
  Filled 2020-12-13 (×5): qty 50

## 2020-12-13 MED ORDER — DIPHENHYDRAMINE HCL 50 MG/ML IJ SOLN
INTRAMUSCULAR | Status: DC | PRN
  Administered 2020-12-13: 12.5 mg via INTRAVENOUS

## 2020-12-13 MED ORDER — LEVETIRACETAM 100 MG/ML PO SOLN
1500.0000 mg | Freq: Two times a day (BID) | ORAL | Status: DC
  Administered 2020-12-14: 1500 mg
  Filled 2020-12-13: qty 15

## 2020-12-13 MED ORDER — ACETAMINOPHEN 10 MG/ML IV SOLN
INTRAVENOUS | Status: DC | PRN
  Administered 2020-12-13: 1000 mg via INTRAVENOUS

## 2020-12-13 MED ORDER — HYDROMORPHONE HCL 1 MG/ML IJ SOLN
0.5000 mg | INTRAMUSCULAR | Status: DC | PRN
Start: 2020-12-13 — End: 2020-12-14
  Administered 2020-12-14 (×2): 0.5 mg via INTRAVENOUS
  Filled 2020-12-13 (×2): qty 1

## 2020-12-13 MED ORDER — DEXAMETHASONE SODIUM PHOSPHATE 10 MG/ML IJ SOLN
INTRAMUSCULAR | Status: DC | PRN
  Administered 2020-12-13: 10 mg via INTRAVENOUS

## 2020-12-13 MED ORDER — ACETAMINOPHEN 325 MG PO TABS: 650.0000 mg | ORAL_TABLET | ORAL | Status: DC | PRN

## 2020-12-13 MED ORDER — 0.9 % SODIUM CHLORIDE (POUR BTL) OPTIME
TOPICAL | Status: DC | PRN
  Administered 2020-12-13: 1000 mL

## 2020-12-13 MED ORDER — SODIUM CHLORIDE 0.9 % IV SOLN: INTRAVENOUS | Status: DC | PRN

## 2020-12-13 MED ORDER — PANTOPRAZOLE SODIUM 40 MG IV SOLR
40.0000 mg | INTRAVENOUS | Status: DC
  Administered 2020-12-14: 40 mg via INTRAVENOUS
  Filled 2020-12-13: qty 40

## 2020-12-13 MED ORDER — IPRATROPIUM-ALBUTEROL 0.5-2.5 (3) MG/3ML IN SOLN: 3.0000 mL | RESPIRATORY_TRACT | Status: DC | PRN

## 2020-12-13 SURGICAL SUPPLY — 102 items
ADH SKN CLS APL DERMABOND .7 (GAUZE/BANDAGES/DRESSINGS) ×4
BAG COUNTER SPONGE SURGICOUNT (BAG) ×7 IMPLANT
BAG SPNG CNTER NS LX DISP (BAG) ×10
BAND INSRT 18 STRL LF DISP RB (MISCELLANEOUS)
BAND RUBBER #18 3X1/16 STRL (MISCELLANEOUS) IMPLANT
BASKET BONE COLLECTION (BASKET) ×3 IMPLANT
BLADE CLIPPER SURG (BLADE) IMPLANT
BUR CUTTER 7.0 ROUND (BURR) ×1 IMPLANT
BUR MATCHSTICK NEURO 3.0 LAGG (BURR) ×3 IMPLANT
BUR PRECISION FLUTE 5.0 (BURR) ×3 IMPLANT
CAGE SABLE 10X30 7-14 15D (Cage) ×1 IMPLANT
CANISTER SUCT 3000ML PPV (MISCELLANEOUS) ×4 IMPLANT
CNTNR URN SCR LID CUP LEK RST (MISCELLANEOUS) ×2 IMPLANT
CONN CLOSED ILIAC 20 (Connector) ×3 IMPLANT
CONN CROSS LOCK VA 42-52 (Connector) ×3 IMPLANT
CONN OPEN OPEN REV 11 5.0/6.35 (Connector) ×6 IMPLANT
CONNECTOR CLOSED ILIAC 20 (Connector) IMPLANT
CONNECTOR CROSS LOCK VA 42-52 (Connector) IMPLANT
CONNECTOR OPEN ILIAC 20 (Connector) ×2 IMPLANT
CONNECTOR OPN REV11 5.0/6.35 (Connector) IMPLANT
CONT SPEC 4OZ STRL OR WHT (MISCELLANEOUS) ×3
COVER BACK TABLE 60X90IN (DRAPES) ×3 IMPLANT
DECANTER SPIKE VIAL GLASS SM (MISCELLANEOUS) ×3 IMPLANT
DERMABOND ADVANCED (GAUZE/BANDAGES/DRESSINGS) ×2
DERMABOND ADVANCED .7 DNX12 (GAUZE/BANDAGES/DRESSINGS) ×4 IMPLANT
DRAIN JACKSON PRATT 10MM FLAT (MISCELLANEOUS) IMPLANT
DRAPE 3/4 80X56 (DRAPES) ×3 IMPLANT
DRAPE C-ARM 42X72 X-RAY (DRAPES) ×4 IMPLANT
DRAPE C-ARMOR (DRAPES) ×3 IMPLANT
DRAPE LAPAROTOMY 100X72X124 (DRAPES) ×3 IMPLANT
DRAPE MICROSCOPE LEICA (MISCELLANEOUS) IMPLANT
DRAPE SCAN PATIENT (DRAPES) ×3 IMPLANT
DRSG AQUACEL AG ADV 3.5X14 (GAUZE/BANDAGES/DRESSINGS) ×1 IMPLANT
DRSG OPSITE 4X5.5 SM (GAUZE/BANDAGES/DRESSINGS) ×2 IMPLANT
DRSG OPSITE POSTOP 4X6 (GAUZE/BANDAGES/DRESSINGS) IMPLANT
DURAPREP 26ML APPLICATOR (WOUND CARE) ×3 IMPLANT
ELECT BLADE INSULATED 6.5IN (ELECTROSURGICAL) ×3
ELECT REM PT RETURN 9FT ADLT (ELECTROSURGICAL) ×6
ELECTRODE BLDE INSULATED 6.5IN (ELECTROSURGICAL) ×2 IMPLANT
ELECTRODE REM PT RTRN 9FT ADLT (ELECTROSURGICAL) ×2 IMPLANT
EVACUATOR 1/8 PVC DRAIN (DRAIN) ×1 IMPLANT
EVACUATOR SILICONE 100CC (DRAIN) IMPLANT
GAUZE 4X4 16PLY ~~LOC~~+RFID DBL (SPONGE) ×4 IMPLANT
GAUZE SPONGE 4X4 12PLY STRL (GAUZE/BANDAGES/DRESSINGS) IMPLANT
GLOVE EXAM NITRILE XL STR (GLOVE) IMPLANT
GLOVE SURG ENC MOIS LTX SZ8 (GLOVE) ×8 IMPLANT
GLOVE SURG LTX SZ7 (GLOVE) ×1 IMPLANT
GLOVE SURG LTX SZ7.5 (GLOVE) ×12 IMPLANT
GLOVE SURG UNDER POLY LF SZ7.5 (GLOVE) ×4 IMPLANT
GOWN STRL REUS W/ TWL LRG LVL3 (GOWN DISPOSABLE) ×2 IMPLANT
GOWN STRL REUS W/ TWL XL LVL3 (GOWN DISPOSABLE) ×2 IMPLANT
GOWN STRL REUS W/TWL 2XL LVL3 (GOWN DISPOSABLE) IMPLANT
GOWN STRL REUS W/TWL LRG LVL3 (GOWN DISPOSABLE) ×24
GOWN STRL REUS W/TWL XL LVL3 (GOWN DISPOSABLE) ×15
GRAFT TRINITY ELITE LGE HUMAN (Tissue) ×1 IMPLANT
HANDLE T POWER 1/4 SQUARE (MISCELLANEOUS) ×1 IMPLANT
HEMOSTAT POWDER KIT SURGIFOAM (HEMOSTASIS) ×4 IMPLANT
HEMOSTAT POWDER SURGIFOAM 1G (HEMOSTASIS) ×2 IMPLANT
INTERBODY SABLE 10X26 7-14 15D (Miscellaneous) ×1 IMPLANT
KIT BASIN OR (CUSTOM PROCEDURE TRAY) ×3 IMPLANT
KIT TURNOVER KIT B (KITS) ×3 IMPLANT
MARKER SPHERE PSV REFLC 13MM (MARKER) ×7 IMPLANT
MILL MEDIUM DISP (BLADE) ×4 IMPLANT
NDL HYPO 18GX1.5 BLUNT FILL (NEEDLE) IMPLANT
NDL HYPO 21X1.5 SAFETY (NEEDLE) ×2 IMPLANT
NDL SPNL 18GX3.5 QUINCKE PK (NEEDLE) IMPLANT
NEEDLE HYPO 18GX1.5 BLUNT FILL (NEEDLE) IMPLANT
NEEDLE HYPO 21X1.5 SAFETY (NEEDLE) ×3 IMPLANT
NEEDLE HYPO 22GX1.5 SAFETY (NEEDLE) ×3 IMPLANT
NEEDLE SPNL 18GX3.5 QUINCKE PK (NEEDLE) IMPLANT
NS IRRIG 1000ML POUR BTL (IV SOLUTION) ×3 IMPLANT
PACK LAMINECTOMY NEURO (CUSTOM PROCEDURE TRAY) ×3 IMPLANT
PAD ARMBOARD 7.5X6 YLW CONV (MISCELLANEOUS) ×9 IMPLANT
PATTIES SURGICAL 1X1 (DISPOSABLE) ×1 IMPLANT
PUTTY DBM 10CC (Putty) ×2 IMPLANT
ROD COCR S INVICTUS 5.5X400 (Rod) ×2 IMPLANT
ROD COCR STRT INVICTUS 5.5X300 (Rod) ×1 IMPLANT
SCREW CANC PA 6.5X45 (Screw) ×2 IMPLANT
SCREW CORT PA 5.5X50 (Screw) ×1 IMPLANT
SCREW CORT PA 6.5X50 (Screw) ×8 IMPLANT
SCREW CORT PA 7.5X45 (Screw) ×2 IMPLANT
SCREW CORT PA 7.5X50 (Screw) ×5 IMPLANT
SCREW PA CLOSED ILIAC 8.5X100 (Screw) ×1 IMPLANT
SCREW PA ILIAC 8.5X100 (Screw) ×1 IMPLANT
SET SCREW (Screw) ×81 IMPLANT
SET SCREW SPNE (Screw) IMPLANT
SLEEVE SURGEON STRL (DRAPES) ×1 IMPLANT
SPONGE NEURO XRAY DETECT 1X3 (DISPOSABLE) ×1 IMPLANT
SPONGE SURGIFOAM ABS GEL 100 (HEMOSTASIS) IMPLANT
SPONGE T-LAP 4X18 ~~LOC~~+RFID (SPONGE) ×4 IMPLANT
STAPLER VISISTAT 35W (STAPLE) ×4 IMPLANT
SUT MNCRL AB 3-0 PS2 18 (SUTURE) ×3 IMPLANT
SUT STRATAFIX 1PDS 45CM VIOLET (SUTURE) ×2 IMPLANT
SUT VIC AB 0 CT1 18XCR BRD8 (SUTURE) ×2 IMPLANT
SUT VIC AB 0 CT1 8-18 (SUTURE) ×9
SUT VIC AB 2-0 CP2 18 (SUTURE) ×6 IMPLANT
SYR 30ML LL (SYRINGE) ×3 IMPLANT
TOWEL GREEN STERILE (TOWEL DISPOSABLE) ×3 IMPLANT
TOWEL GREEN STERILE FF (TOWEL DISPOSABLE) ×4 IMPLANT
TRAY FOLEY MTR SLVR 16FR STAT (SET/KITS/TRAYS/PACK) ×2 IMPLANT
WATER STERILE IRR 1000ML POUR (IV SOLUTION) ×3 IMPLANT
YANKAUER SUCT BULB TIP NO VENT (SUCTIONS) ×1 IMPLANT

## 2020-12-13 NOTE — Consult Note (Signed)
Urology Consult  Consulting MD: Duffy Rhody, MD  CC: Difficult catheterization  HPI: This is a 60year old male under anesthesia for major back surgery by Dr. Marcello Moores.  He has no known urologic history.  After induction of anesthesia, catheterization was unsuccessful after several attempts.  Urologic consultation is requested.  PMH: Past Medical History:  Diagnosis Date   Anxiety    Arthritis    Bipolar 1 disorder, depressed ([place])    Bipolar disorder ([place])    Depression    History of posttraumatic stress disorder (PTSD)    Hypertension    PTSD (post-traumatic stress disorder)    Seizures ([place])    Sleep apnea    Smoker    Tobacco use disorder     PSH: History reviewed. No pertinent surgical history.  Allergies: No Known Allergies  Medications: Medications Prior to Admission  Medication Sig Dispense Refill Last Dose   acetaminophen (TYLENOL) 500 MG tablet Take 2 tablets (1,000 mg total) by mouth every 6 (six) hours as needed for mild pain. 30 tablet 0 12/12/2020 at 0500   amLODipine (NORVASC) 10 MG tablet Take 10 mg by mouth in the morning.   12/12/2020 at 0500   baclofen (LIORESAL) 10 MG tablet Take 10 mg by mouth 2 (two) times daily.   12/12/2020 at 0500   Cholecalciferol (VITAMIN D3) 50 MCG (2000 UT) TABS Take 2,000 Units by mouth in the morning and at bedtime.   Past Week   gabapentin (NEURONTIN) 300 MG capsule Take 900 mg by mouth 2 (two) times daily.   12/12/2020 at 0500   hydrOXYzine (VISTARIL) 50 MG capsule Take 50 mg by mouth in the morning and at bedtime.   12/12/2020 at 0500   lamoTRIgine (LAMICTAL) 100 MG tablet Take 1 tablet (100 mg total) by mouth 2 (two) times daily. 60 tablet 2 12/12/2020 at 0500   levETIRAcetam (KEPPRA) 750 MG tablet Take 1,500 mg by mouth 2 (two) times daily.   12/12/2020 at 0500   lisinopril-hydrochlorothiazide (ZESTORETIC) 20-12.5 MG tablet Take 1 tablet by mouth in the morning.   12/11/2020   meloxicam (MOBIC) 15 MG tablet Take 15 mg by mouth daily  after breakfast.   12/11/2020   QUEtiapine (SEROQUEL) 200 MG tablet Take 200 mg by mouth at bedtime.   12/11/2020   sertraline (ZOLOFT) 100 MG tablet Take 100 mg by mouth daily after breakfast.   12/12/2020 at 0500     Social History: Social History   Socioeconomic History   Marital status: Significant Other    Spouse name: Not on file   Number of children: 1   Years of education: Not on file   Highest education level: Not on file  Occupational History    Employer: [employer]  Tobacco Use   Smoking status: Every Day    Packs/day: 0.50    Years: 20.00    Pack years: 10.00    Types: Cigarettes   Smokeless tobacco: Never  Vaping Use   Vaping Use: Never used  Substance and Sexual Activity   Alcohol use: Yes    Alcohol/week: 2.0 standard drinks    Types: 2 Shots of liquor per week   Drug use: Yes    Types: Marijuana    Comment: pt stated he quit 2 weeks ago 08/10/12, pt reports doing marijuana on 11/13/12   Sexual activity: Yes  Other Topics Concern   Not on file  Social History Narrative   ** Merged History Encounter **  Social Determinants of Health   Financial Resource Strain: Not on file  Food Insecurity: Not on file  Transportation Needs: Not on file  Physical Activity: Not on file  Stress: Not on file  Social Connections: Not on file  Intimate Partner Violence: Not on file    Family History:   Physical Exam: Patient is under general anesthetic.  History not available. Penis:  Circumcised.  No lesions. Urethra: Orthotopic meatus. Scrotum: No lesions.  No ecchymosis.  No erythema. Testicles: Descended bilaterally.  No masses bilaterally. Epididymis: Palpable bilaterally.  Non Tender to palpation.  Studies:  Recent Labs    12/13/20 0740  HGB 13.4  WBC 13.2*  PLT 222    Recent Labs    12/13/20 0740  NA 134*  K 3.5  CL 101  CO2 22  BUN 10  CREATININE 1.05  CALCIUM 9.5  GFRNONAA >60     No results for input(s): INR, APTT in the last 72  hours.  Invalid input(s): PT   Invalid input(s): ABG  His penis and genitalia were prepped and draped.  K-Y jelly was introduced into his urethra.  I first attempted catheterization with an 25 French coud tip catheter.  This was unsuccessful, meeting resistance approximately 10 cm into the urethra.  Flexible cystoscopy was then performed.  There was an obvious stricture with the beginning of a false passage from catheterization just lateral to this.  I was able to navigate a sensor tip guidewire through what I thought was the urethral lumen.  This passed quite easily.  Following placement of the sensor tip guidewire and what I thought would be the bladder, I negotiated a 6 [place] open-ended catheter over top of the guidewire and remove the guidewire once it was passed proximally.  Urine dripped out, confirming positioning in the bladder.  I then replaced the guidewire, remove the open-ended catheter, and dilated his urethra to 84 [place] with Hayman sounds.  This was done quite easily.  The sounds were then removed.  I placed an 20 [place] council tip catheter over top of the guidewire.  Guidewire was removed, clear urine obtained balloon filled with 10 cc of water, hooked to dependent drainage.  At this point the procedure was terminated.   Assessment: Urethral stricture, dilated and bladder drained  Plan: Remove the catheter as you normally would postoperatively.  He should have no voiding issues.  It would be worthwhile having him follow-up with urology in approximately 1 month to check on bladder emptying and symptoms.    Pager:[phone]

## 2020-12-13 NOTE — Progress Notes (Signed)
Orthopedic Tech Progress Note Patient Details:  Keith Dixon May 29, 1960 [id number] Dropped off in room Ortho Devices Ortho Device/Splint Location: TLSO Ortho Device/Splint Interventions: Ordered   Post Interventions Patient Tolerated: Other (comment) Instructions Provided: Other (comment)  [name] 12/13/2020, 7:26 AM

## 2020-12-13 NOTE — Progress Notes (Signed)
eLink Physician-Brief Progress Note Patient Name: Keith Dixon DOB: 08-11-1960 MRN: [id number]   Date of Service  12/13/2020  HPI/Events of Note  Patient is a 60 y.o. male who developed worsening back pain and R>L leg pain over the past several years.  He has neurogenic claudication symptoms and is functionally severely debilitated. Arrives in ICU intubated and ventilated s/p Anterior Lateral Interbody Fusion Lumbar One-Two, Lumbar Two-Three, Lumbar Three-Four, Lumbar Four-Five. PCCM consulted for ventilator management.  eICU Interventions  Plan: Ventilator orders: 40%/PRVC 16/TV 620/P 5. ABG STAT. Portable CXR and Abdominal films now. Propofol IV infusion. Titrate to RASS = 0 to -1. Further management per PCCM ground team.      Intervention Category Evaluation Type: New Patient Evaluation  Lysle Dingwall 12/13/2020, 10:16 PM

## 2020-12-13 NOTE — Anesthesia Procedure Notes (Signed)
Procedure Name: Intubation Date/Time: 12/13/2020 9:29 AM Performed by: Clearnce Sorrel, CRNA Pre-anesthesia Checklist: Patient identified, Emergency Drugs available, Suction available and Patient being monitored Patient Re-evaluated:Patient Re-evaluated prior to induction Oxygen Delivery Method: Circle System Utilized Preoxygenation: Pre-oxygenation with 100% oxygen Induction Type: IV induction Ventilation: Mask ventilation without difficulty Laryngoscope Size: Glidescope and 4 Grade View: Grade I Tube type: Oral Tube size: 7.5 mm Number of attempts: 1 Airway Equipment and Method: Stylet and Oral airway Placement Confirmation: ETT inserted through vocal cords under direct vision, positive ETCO2 and breath sounds checked- equal and bilateral Secured at: 23 cm Tube secured with: Tape Dental Injury: Teeth and Oropharynx as per pre-operative assessment

## 2020-12-13 NOTE — Transfer of Care (Signed)
Immediate Anesthesia Transfer of Care Note  Patient: Keith Dixon  Procedure(s) Performed: Thoracic Ten - ILIAC FUSION, L5-S1 TLIF, Posterior osteotomies for deformity correction and decompression L2-3, L3-4, [id number] (Back) APPLICATION OF INTRAOPERATIVE CT SCAN  Patient Location: ICU  Anesthesia Type:General  Level of Consciousness: sedated and Patient remains intubated per anesthesia plan  Airway & Oxygen Therapy: Patient remains intubated per anesthesia plan and Patient placed on Ventilator (see vital sign flow sheet for setting)  Post-op Assessment: Report given to RN and Post -op Vital signs reviewed and stable  Post vital signs: Reviewed and stable  Last Vitals:  Vitals Value Taken Time  BP 191/126 12/13/20 2148  Temp    Pulse 119 12/13/20 2153  Resp 14 12/13/20 2153  SpO2 98 % 12/13/20 2153  Vitals shown include unvalidated device data.  Last Pain:  Vitals:   12/13/20 0902  TempSrc: Oral  PainSc:       Patients Stated Pain Goal: 4 ([id number])  Complications: No notable events documented.

## 2020-12-13 NOTE — Progress Notes (Signed)
Subjective: Patient reports some nausea, still has leg pain.  Denies leg numbness  Objective: Vital signs in last 24 hours: Temp:  [97.3 F (36.3 C)-98.3 F (36.8 C)] 97.9 F (36.6 C) (08/04 0902) Pulse Rate:  [73-92] 92 (08/04 0700) Resp:  [12-20] 16 (08/04 0700) BP: (134-169)/(60-105) 169/86 (08/04 0700) SpO2:  [93 %-100 %] 98 % (08/04 0700)  Intake/Output from previous day: 08/03 0701 - 08/04 0700 In: 4013.8 [P.O.:400; I.V.:3513.8; IV Piggyback:100] Out: 7300 [Urine:7100; Blood:200] Intake/Output this shift: No intake/output data recorded. Eyes open but slightly drowsy, confused but oriented to person, place.  Answers questions and briskly follows commands.  Has at least 4/5 strength KE, DF, PF bilaterally, and appears symmetric.  Detailed sensory exam could not be performed.  His incision is c/d   Lab Results: Recent Labs    12/13/20 0740  WBC 13.2*  HGB 13.4  HCT 40.8  PLT 222   BMET No results for input(s): NA, K, CL, CO2, GLUCOSE, BUN, CREATININE, CALCIUM in the last 72 hours.  Studies/Results: DG Lumbar Spine 2-3 Views  Result Date: 12/12/2020 CLINICAL DATA:  Lumbar fusion L1-2 L2-3, L3-4, and L4-5. EXAM: LUMBAR SPINE - 2-3 VIEW; DG C-ARM 1-60 MIN COMPARISON:  None. FINDINGS: Interbody spacers present at L1-2, L2-3, L3-4, and L4-5. No unexpected radiopaque foreign body is present. IMPRESSION: Status post lumbar fusion without radiographic evidence for complication. Electronically Signed   By: San Morelle M.D.   On: 12/12/2020 15:22   DG C-Arm 1-60 Min  Result Date: 12/12/2020 CLINICAL DATA:  Lumbar fusion L1-2 L2-3, L3-4, and L4-5. EXAM: LUMBAR SPINE - 2-3 VIEW; DG C-ARM 1-60 MIN COMPARISON:  None. FINDINGS: Interbody spacers present at L1-2, L2-3, L3-4, and L4-5. No unexpected radiopaque foreign body is present. IMPRESSION: Status post lumbar fusion without radiographic evidence for complication. Electronically Signed   By: San Morelle M.D.   On:  12/12/2020 15:22    Assessment/Plan: S/p L1-2, L2-3, L3-4, L4-5 DLIFs - patient was somnolent after surgery yesterday but slowly woke up.  He is still confused-- I think this is due to the numerous psychotropic medications and long anesthesia in yesterday's case.  He is normal sinus rhythm with no tachycardia.  When he is nausea, he is diaphoretic but comforatble otherwise.  His CBC this morning was reassuring, with no anemia.  No shortness of breath or chest pain.  Will plan to proceed with 2nd stage posterior decompression and fusion as I think delaying the case would be more deleterious than helpful, as he would have to be on extended bedrest with increased risk of pneumonia, DVT, etc.  Discussed with Dr. Nyoka Cowden.   Vallarie Mare 12/13/2020, 9:12 AM

## 2020-12-13 NOTE — Progress Notes (Signed)
Neurosurgery   Pt seen and examined.   Plan for stage 2 posterior decompression and fusion today

## 2020-12-13 NOTE — Op Note (Addendum)
Procedure(s): Thoracic Ten - ILIAC FUSION, L5-S1 TLIF, Posterior osteotomies for deformity correction and decompression [id number], 99991111, 99991111 APPLICATION OF INTRAOPERATIVE CT SCAN Procedure Note  BRYE MYLIN male 60 y.o. 12/13/2020  Procedure(s) and Anesthesia Type:    * Thoracic Ten - ILIAC FUSION, L5-S1 TLIF, Posterior osteotomies for deformity correction and decompression L2-3, L3-4, L4-5 - General    * APPLICATION OF INTRAOPERATIVE CT SCAN - General  Surgeon(s) and Role:    Marcello Moores, Dorcas Carrow, MD - Primary    * Dawley, Theodoro Doing, DO - Assisting   Indications: This is a 60 year old man who had severe spinal deformity and severe stenosis that was rapidly progressing over the past 1.5 years, to the point where he was essentially nonambulatory.  He underwent stage I L1-2, L2-3, L3-4, and L4-5 DLIFs yesterday.  T10 to iliac fusion with osteotomies for reduction and decompression and L5-S1 TLIF was planned for stage II.  I discussed the general technique of the surgery with the patient.  Risks, benefits, alternatives, and expected convalescence were discussed with the patient.  Risks discussed included, but were not limited to, bleeding, pain, infection, scar, pseudoarthrosis, adjacent segment disease, neurologic deficit, spinal fluid leak, damage to nearby organs, coma, and death.  Informed consent was obtained.     Surgeon: Vallarie Mare   Assistants: Elwin Sleight, DO. Please note no qualified trainees were available to assist with the procedure, and 2 surgeons were required given the scale of this surgery.  Dr. Reatha Armour assisted with exposure, decompression, placement of instrumentation, and the osteotomies.  Anesthesia: General endotracheal anesthesia  Procedure Detail  Thoracic Ten - ILIAC FUSION, L5-S1 TLIF, Posterior osteotomies for deformity correction and decompression L2-3, 99991111, 99991111, APPLICATION OF INTRAOPERATIVE CT SCAN  The patient was brought to the operating room.  General  anesthesia was induced and patient was intubated by the anesthesia service.  Patient was positioned prone on the Airo bed with all pressure points padded and eyes protected.  His lower back was preprepped with alcohol and prepped and draped in sterile fashion.  A timeout was performed.  Preoperative antibiotics and dexamethasone were administered.  An incision was made with a 10 blade and the subcutaneous tissue and fascia was incised with monopolar electrocautery.  The paraspinous muscles were then dissected off of T10, T11, T12, L1, L2, L3, L4, L5, S1, S2, and S3 in subperiosteal fashion.  Localizing x-ray was obtained to confirm levels.  Dissection then continued out to expose the transverse processes of the lumbar vertebra bilaterally.  A [employer] registration array was then placed on the T9 spinous process and secured snug.  An intraoperative CT was then performed with the Airo machine with registration.  Good registration was confirmed with confirmation of anatomic points with the registration wand.  We then planned entry points for the S2 AI screws between the S1 and S2 foramen using navigation.  The pilot hole was drilled and pedicle probe was then passed through the sacral ala and through the SI joint into the ilium above the sciatic notch.  Palpation with ball ended probe confirmed good bony channels.  The screw holes were then tapped with a navigated tap and ball ended feeler confirmed good bony channels.  8.5 x 100 mm screws were then placed under navigation with good purchase.  Using navigation, pilot holes were then drilled for pedicle screws at T10, T11, T12, L1, L2, L3, L4, L5, and S1 bilaterally.  Navigated pedicle probe and taps were then used to cannulate  the pedicle and body, with ball ended feeler confirming good bony channels.  The screws were then placed under navigation with good purchase.  As expected, he had very sclerotic bone at L3, L4, and L5 with excellent purchase, and good purchase at  the other levels.  as patient had significant amount of bony remodeling from his deformity, the navigation was helpful in achieving optimal pedicle screw placement.  A left L5-S1 hemilaminectomy and facetectomy was then performed.  The exiting nerve root was identified in the severely collapsed foramen and was decompressed.  The disc space was identified and the exiting nerve root was retracted superiorly and the traversing nerve root medially to fully expose the disc and epidural veins were coagulated and cut.  The collapsed disc was incised with a 15 blade and increasing sized disc shavers used to help distract open the disc space.  Thorough discectomy was performed with combination of rongeurs and curettes.  C-arm x-ray was used and a trial was placed to choose an appropriate sized interbody implant.  The interbody space was then packed with morselized allograft and autograft.  A sable interbody expandable cage was then [facility] into place under x-ray guidance and then expanded until snug under x-ray guidance.  This achieved good restoration of disc space and foraminal height. The cage was then backfilled with morselized allograft and autograft.  Following placement of the cage, a second intraoperative CT was performed.  This showed a medial breach of the right S2 AI screw just past the SI joint.  As such, the screw was removed and again using navigation, the screw was directed across the SI joint into the ilium.  C-arm x-ray with teardrop views confirmed placement in the ilium.  All other screws were in optimal position.  We then performed posterior osteotomies with laminectomies and aggressive facetectomies bilaterally at L2-3, L3-4, and L4-5.  Bone was harvested for autograft.  The foramen was widely opened at each of these levels.  His preoperative imaging showed a large left-sided disc herniation with superior extrusion at L2-3.  The disc space was incised at this level and small amount of disc fragments  were removed.  Ball ended nerve hook was easily passed in the ventral epidural space and confirmed no significant remaining stenosis.  Ball ended nerve hook also confirmed good decompression at each of the other levels.  We then bent 5.5 cobalt chrome rods to the appropriate contour and placed them in the tulip heads on the left side.  On the right side, given the patient's dysplastic anatomy, offset connectors were required for the rod.  As there was good purchase of the screws and good posterior lease from his osteotomies, good reduction and improvement in lordosis was able to be achieved with reducing to the lordotic rod.  the screw caps were then final tightened into place.  On the left side, offsets were placed to allow for a third rod to increase the rigidity of the construct.  A cross connector was also placed and final tightened.  The wound was irrigated thoroughly.  Meticulous hemostasis was obtained.  A final intraoperative CT was obtained and showed excellent restoration of his lordosis and reduction of his coronal deformity and optimal placement of his instrumentation.  2 medium Hemovac drains were then placed in the subfascial space and tunneled out the skin and connected to suction.  Decortication of the remaining bone and TPs was performed.  Morselized autograft was then mixed with morselized allograft and placed in the lateral gutters  bilaterally.  The muscle was then closed with 0 Vicryl stitches.  The fascia was closed with 0 Vicryl stitches as well as a size 1 barbed stitch.  The dermal layer was closed with 2-0 Vicryl sutures in buried interrupted fashion.  The skin was closed with staples.  A sterile dressing was placed.  Patient was then flipped supine.  Due to the length of the case, he was kept intubated but was stable throughout the case.  He did require 4 units of packed red blood cell transfusion via the Cell Saver.  All counts were correct at the end of surgery.  No complications were  noted.  Findings: Successful reduction of coronal and sagittal deformity and restoration of lumbar lordosis, and good decompression was achieved.  Estimated Blood Loss: 2 L         Drains: HEMOVAC x 2         Total IV Fluids: See anesthesia records  Blood Given: 2 units PRBCs         Specimens: None         Implants:  AlphaTec  Pedicle screws: 6.5 x 45 mm bilaterally at T10 6.5 x 50 mm bilaterally at T11, T12, L1, L2 7.5 x 50 mm bilaterally at L3, L4, L5 7.5 x 45 mm bilaterally at S1 8.5 mm x 100 mm bilaterally S2AI screws Cobalt chrome rods 5.5 mm x 3 with tri-rod offset on left 20 mm Open offset connector x 2 at R L5 and S1 20 mm Closed offset connector at R L3 42-52 mm Cross connector  Globus expandable interbody Sable cage 10 x 30, 7 mm expanded to 12 mm, 15 degree lordosis at [id number]        Complications:  * No complications entered in OR log *         Disposition: ICU - intubated and hemodynamically stable.         Condition: stable

## 2020-12-13 NOTE — Anesthesia Postprocedure Evaluation (Signed)
Anesthesia Post Note  Patient: Keith Dixon  Procedure(s) Performed: Thoracic Ten - ILIAC FUSION, L5-S1 TLIF, Posterior osteotomies for deformity correction and decompression L2-3, L3-4, 99991111 (Back) APPLICATION OF INTRAOPERATIVE CT SCAN     Patient location during evaluation: SICU Anesthesia Type: General Level of consciousness: sedated Pain management: pain level controlled Vital Signs Assessment: post-procedure vital signs reviewed and stable Respiratory status: patient remains intubated per anesthesia plan Cardiovascular status: stable Postop Assessment: no apparent nausea or vomiting Anesthetic complications: no   No notable events documented.  Last Vitals:  Vitals:   12/13/20 2330 12/13/20 2345  BP: 103/73 117/88  Pulse:    Resp: 18 18  Temp:    SpO2:      Last Pain:  Vitals:   12/13/20 2145  TempSrc: Axillary  PainSc:                  Catalina Gravel

## 2020-12-13 NOTE — Consult Note (Signed)
NAME:  Keith Dixon, MRN:  [id number], DOB:  1960/09/30, LOS: 1 ADMISSION DATE:  12/12/2020, CONSULTATION DATE:  12/13/20  REFERRING MD:  Grandville Silos, CHIEF COMPLAINT:   respiratory depression  History of Present Illness:  Keith Dixon is a 60 y/o M with PMH significant for severe degenerative scoliosis with multi-level listhesis, anxiety, bipolar disorder, PTSD, seizures, OSA and tobacco use who was admitted to neurosurgery for L1-2, L2-3, L3-4, L4-5 DLIFs initially and subsequent T10-pelvis decompression and fusion.   His initial surgery was 8/3 and was slow to wake up, after second  surgery 8/4  (10hr procedure) pt was again slow to awaken post-anesthesia, so decision was made to leave patient intubated overnight.  He was transfused two units PRBC intra-operatively.    Pertinent  Medical History   has a past medical history of Anxiety, Arthritis, Bipolar 1 disorder, depressed ([place]), Bipolar disorder ([place]), Depression, History of posttraumatic stress disorder (PTSD), Hypertension, PTSD (post-traumatic stress disorder), Seizures ([place]), Sleep apnea, Smoker, and Tobacco use disorder.   Significant Hospital Events: Including procedures, antibiotic start and stop dates in addition to other pertinent events   8/3 Admit to neurosurgery, underwent  anterior lateral Interbody fusion of L1-L5, notably had difficult foley insertion requiring urethral dilation 8/4 Stage 2 posterior decompression and fusion, left intubated post-procedure and PCCM consulted  Interim History / Subjective:    Objective   Blood pressure (!) 169/86, pulse 92, temperature 97.9 F (36.6 C), temperature source Oral, resp. rate 16, height 6' (1.829 m), weight 108.9 kg, SpO2 98 %.        Intake/Output Summary (Last 24 hours) at 12/13/2020 2127 Last data filed at 12/13/2020 2029 Gross per 24 hour  Intake 5833.78 ml  Output 7650 ml  Net -1816.22 ml   Filed Weights   12/12/20 0718  Weight: 108.9 kg    General:  critically  ill appearing man lying in bed in NAD HEENT: [place]/AT, eyes anicteric, ETT in place. Neuro: RASS -5, no tracheal cough or pharyngeal gag. Examined on propofol.ac CV: s1s2 , RRR, no m/r/g PULM:  synchronous with MV, not regularly breathing over. CTAB.  Cuff leak present. No ETT secretions. GI: soft, bsx4 active  Extremities: warm/dry, no clubbing or edema  Skin: no rashes or lesions  Resolved Hospital Problem list     Assessment & Plan:    Acute respiratory failure with hypoxia and hypercapnia due to post-op respiratory depression. Had encephalopathy after previous surgery due to chronic meds and anesthesia effects. Post-anesthesia and prolonged surgical intervention today P: -LTVV, 4-8cc/kg IBW; rate increased due to mild acute hypercapnia -Maintain full vent support with SAT/SBT tomorrow as tolerated -titrate Vent setting to maintain SpO2 greater than or equal to 90%. -HOB elevated 30 degrees. -Goal plateau pressures less than 30 cm H20 and DP <15 -Follow chest x-ray, ABG prn.   -Bronchial hygiene and RT/bronchodilator protocol.  Hypotension due to sedation -peripheral NE; goal MAP >65 -500cc LR bolus; can given more if needed  Acute metabolic encephalopathy -holding PTA baclofen, flexeril, lamictal, zoloft, seroquel until more awake  H/o seizure disorder -con't scheduled keppra, gabapentin  History of tobacco abuse -needs tobacco cessation counseling when appropriate  Best Practice (right click and "Reselect all SmartList Selections" daily)   Diet/type: NPO DVT prophylaxis: not indicated GI prophylaxis: PPI Lines: N/A Foley:  N/A Code Status:  full code Last date of multidisciplinary goals of care discussion [daughter updated at bedside ]  Labs   CBC: Recent Labs  Lab 12/13/20 0740 12/13/20  1955  WBC 13.2* 12.1*  NEUTROABS 11.1*  --   HGB 13.4 11.4*  HCT 40.8 35.0*  MCV 81.0 85.8  PLT 222 0000000    Basic Metabolic Panel: Recent Labs  Lab 12/13/20 0740   NA 134*  K 3.5  CL 101  CO2 22  GLUCOSE 116*  BUN 10  CREATININE 1.05  CALCIUM 9.5   GFR: Estimated Creatinine Clearance: 97.7 mL/min (by C-G formula based on SCr of 1.05 mg/dL). Recent Labs  Lab 12/13/20 0740 12/13/20 1955  WBC 13.2* 12.1*    Liver Function Tests: Recent Labs  Lab 12/13/20 0740  AST 45*  ALT 21  ALKPHOS 78  BILITOT 1.6*  PROT 7.0  ALBUMIN 4.0   No results for input(s): LIPASE, AMYLASE in the last 168 hours. No results for input(s): AMMONIA in the last 168 hours.  ABG    Component Value Date/Time   HCO3 20.0 02/13/2007 1305   TCO2 21 (L) 09/08/2020 1208   ACIDBASEDEF 6.0 (H) 02/13/2007 1305     Coagulation Profile: No results for input(s): INR, PROTIME in the last 168 hours.  Cardiac Enzymes: No results for input(s): CKTOTAL, CKMB, CKMBINDEX, TROPONINI in the last 168 hours.  HbA1C: Hgb A1c MFr Bld  Date/Time Value Ref Range Status  11/06/2010 04:46 PM 6.2 (H) <5.7 % Final    Comment:    (NOTE)                                                                       According to the ADA Clinical Practice Recommendations for 2011, when HbA1c is used as a screening test:  >=6.5%   Diagnostic of Diabetes Mellitus           (if abnormal result is confirmed) 5.7-6.4%   Increased risk of developing Diabetes Mellitus References:Diagnosis and Classification of Diabetes Mellitus,Diabetes [id number] 1):S62-S69 and Standards of Medical Care in         Diabetes - 2011,Diabetes [id number] (Suppl 1):S11-S61.    CBG: No results for input(s): GLUCAP in the last 168 hours.  Review of Systems:   Unable to be obtained due to intubated/ sedated.  Past Medical History:  He,  has a past medical history of Anxiety, Arthritis, Bipolar 1 disorder, depressed ([place]), Bipolar disorder ([place]), Depression, History of posttraumatic stress disorder (PTSD), Hypertension, PTSD (post-traumatic stress disorder), Seizures ([place]), Sleep apnea, Smoker, and  Tobacco use disorder.   Surgical History:   Past Surgical History:  Procedure Laterality Date   ANTERIOR LATERAL LUMBAR FUSION 4 LEVELS N/A 12/12/2020   Procedure: Anterior Lateral Interbody Fusion Lumbar One-Two, Lumbar Two-Three, Lumbar Three-Four, Lumbar Four-Five;  Surgeon: Franchot Gallo, MD;  Location: [place];  Service: Urology;  Laterality: N/A;  Anterior Lateral Interbody Fusion Lumbar One-Two, Lumbar Two-Three, Lumbar Three-Four, Lumbar Four-Five   CYSTOSCOPY  12/12/2020   Procedure: CYSTOSCOPY, URETHRAL DILATION, DIFFICULT FOLEY INSERTION;  Surgeon: Franchot Gallo, MD;  Location: [place] [facility];  Service: Urology;;     Social History:   reports that he has been smoking cigarettes. He has a 10.00 pack-year smoking history. He has never used smokeless tobacco. He reports current alcohol use of about 2.0 standard drinks of alcohol per week. He reports current drug use. Drug: Marijuana.   Family  History:  His family history includes Breast cancer (age of onset: 23) in his mother; Hypertension in his brother; Seizures in his mother.   Allergies No Known Allergies   Home Medications  Prior to Admission medications   Medication Sig Start Date End Date Taking? Authorizing Provider  acetaminophen (TYLENOL) 500 MG tablet Take 2 tablets (1,000 mg total) by mouth every 6 (six) hours as needed for mild pain. 04/25/19  Yes Meuth, Brooke A, PA-C  amLODipine (NORVASC) 10 MG tablet Take 10 mg by mouth in the morning.   Yes [provider]  baclofen (LIORESAL) 10 MG tablet Take 10 mg by mouth 2 (two) times daily.   Yes [provider]  Cholecalciferol (VITAMIN D3) 50 MCG (2000 UT) TABS Take 2,000 Units by mouth in the morning and at bedtime.   Yes [provider]  gabapentin (NEURONTIN) 300 MG capsule Take 900 mg by mouth 2 (two) times daily.   Yes [provider]  hydrOXYzine (VISTARIL) 50 MG capsule Take 50 mg by mouth in the morning and at bedtime.   Yes  [provider]  lamoTRIgine (LAMICTAL) 100 MG tablet Take 1 tablet (100 mg total) by mouth 2 (two) times daily. 09/14/20  Yes Oswald Hillock, MD  levETIRAcetam (KEPPRA) 750 MG tablet Take 1,500 mg by mouth 2 (two) times daily.   Yes [provider]  lisinopril-hydrochlorothiazide (ZESTORETIC) 20-12.5 MG tablet Take 1 tablet by mouth in the morning.   Yes [provider]  meloxicam (MOBIC) 15 MG tablet Take 15 mg by mouth daily after breakfast.   Yes [provider]  QUEtiapine (SEROQUEL) 200 MG tablet Take 200 mg by mouth at bedtime.   Yes [provider]  sertraline (ZOLOFT) 100 MG tablet Take 100 mg by mouth daily after breakfast.   Yes [provider]     Critical care time: 40 min.      Julian Hy, DO 12/14/20 12:07 AM [facility]

## 2020-12-14 ENCOUNTER — Inpatient Hospital Stay (HOSPITAL_COMMUNITY): Payer: No Typology Code available for payment source

## 2020-12-14 ENCOUNTER — Other Ambulatory Visit (HOSPITAL_COMMUNITY): Payer: No Typology Code available for payment source

## 2020-12-14 DIAGNOSIS — N179 Acute kidney failure, unspecified: Secondary | ICD-10-CM

## 2020-12-14 DIAGNOSIS — M4126 Other idiopathic scoliosis, lumbar region: Secondary | ICD-10-CM

## 2020-12-14 DIAGNOSIS — M7989 Other specified soft tissue disorders: Secondary | ICD-10-CM

## 2020-12-14 DIAGNOSIS — Z95828 Presence of other vascular implants and grafts: Secondary | ICD-10-CM | POA: Diagnosis not present

## 2020-12-14 DIAGNOSIS — J9601 Acute respiratory failure with hypoxia: Secondary | ICD-10-CM | POA: Diagnosis not present

## 2020-12-14 DIAGNOSIS — Z452 Encounter for adjustment and management of vascular access device: Secondary | ICD-10-CM

## 2020-12-14 DIAGNOSIS — Z9889 Other specified postprocedural states: Secondary | ICD-10-CM

## 2020-12-14 DIAGNOSIS — I517 Cardiomegaly: Secondary | ICD-10-CM

## 2020-12-14 DIAGNOSIS — J9602 Acute respiratory failure with hypercapnia: Secondary | ICD-10-CM | POA: Diagnosis not present

## 2020-12-14 LAB — ECHOCARDIOGRAM COMPLETE
AR max vel: 2.95 cm2
AV Area VTI: 3.15 cm2
AV Area mean vel: 2.59 cm2
AV Mean grad: 5 mmHg
AV Peak grad: 9.1 mmHg
Ao pk vel: 1.51 m/s
Area-P 1/2: 2.57 cm2
Calc EF: 52.1 %
Height: 72 in
MV VTI: 4.08 cm2
S' Lateral: 2.6 cm
Single Plane A2C EF: 50 %
Single Plane A4C EF: 54.8 %
Weight: 3840 oz

## 2020-12-14 LAB — CBC
HCT: 37.8 % — ABNORMAL LOW (ref 39.0–52.0)
HCT: 30.5 % — ABNORMAL LOW (ref 39.0–52.0)
Hemoglobin: 12.9 g/dL — ABNORMAL LOW (ref 13.0–17.0)
Hemoglobin: 10.4 g/dL — ABNORMAL LOW (ref 13.0–17.0)
MCH: 28.4 pg (ref 26.0–34.0)
MCH: 28.3 pg (ref 26.0–34.0)
MCHC: 34.1 g/dL (ref 30.0–36.0)
MCHC: 34.1 g/dL (ref 30.0–36.0)
MCV: 83.3 fL (ref 80.0–100.0)
MCV: 82.9 fL (ref 80.0–100.0)
Platelets: 134 10*3/uL — ABNORMAL LOW (ref 150–400)
RBC: 4.54 MIL/uL (ref 4.22–5.81)
RBC: 3.68 MIL/uL — ABNORMAL LOW (ref 4.22–5.81)
RDW: 15.9 % — ABNORMAL HIGH (ref 11.5–15.5)
RDW: 15.9 % — ABNORMAL HIGH (ref 11.5–15.5)
WBC: 16.3 10*3/uL — ABNORMAL HIGH (ref 4.0–10.5)
WBC: 15.4 10*3/uL — ABNORMAL HIGH (ref 4.0–10.5)
nRBC: 0 % (ref 0.0–0.2)
nRBC: 0 % (ref 0.0–0.2)

## 2020-12-14 LAB — COMPREHENSIVE METABOLIC PANEL
ALT: 145 U/L — ABNORMAL HIGH (ref 0–44)
AST: 319 U/L — ABNORMAL HIGH (ref 15–41)
Albumin: 2.5 g/dL — ABNORMAL LOW (ref 3.5–5.0)
Alkaline Phosphatase: 63 U/L (ref 38–126)
Anion gap: 10 (ref 5–15)
BUN: 32 mg/dL — ABNORMAL HIGH (ref 6–20)
CO2: 18 mmol/L — ABNORMAL LOW (ref 22–32)
Calcium: 7.5 mg/dL — ABNORMAL LOW (ref 8.9–10.3)
Chloride: 104 mmol/L (ref 98–111)
Creatinine, Ser: 4.05 mg/dL — ABNORMAL HIGH (ref 0.61–1.24)
GFR, Estimated: 16 mL/min — ABNORMAL LOW (ref >60)
Glucose, Bld: 152 mg/dL — ABNORMAL HIGH (ref 70–99)
Potassium: 4.7 mmol/L (ref 3.5–5.1)
Sodium: 132 mmol/L — ABNORMAL LOW (ref 135–145)
Total Bilirubin: 0.7 mg/dL (ref 0.3–1.2)
Total Protein: 4.7 g/dL — ABNORMAL LOW (ref 6.5–8.1)

## 2020-12-14 LAB — POCT I-STAT 7, (LYTES, BLD GAS, ICA,H+H)
Acid-base deficit: 4 mmol/L — ABNORMAL HIGH (ref 0.0–2.0)
Acid-base deficit: 6 mmol/L — ABNORMAL HIGH (ref 0.0–2.0)
Acid-base deficit: 7 mmol/L — ABNORMAL HIGH (ref 0.0–2.0)
Bicarbonate: 21.4 mmol/L (ref 20.0–28.0)
Bicarbonate: 19.5 mmol/L — ABNORMAL LOW (ref 20.0–28.0)
Bicarbonate: 18 mmol/L — ABNORMAL LOW (ref 20.0–28.0)
Calcium, Ion: 0.99 mmol/L — ABNORMAL LOW (ref 1.15–1.40)
Calcium, Ion: 0.83 mmol/L — CL (ref 1.15–1.40)
Calcium, Ion: 0.84 mmol/L — CL (ref 1.15–1.40)
HCT: 36 % — ABNORMAL LOW (ref 39.0–52.0)
HCT: 33 % — ABNORMAL LOW (ref 39.0–52.0)
HCT: 28 % — ABNORMAL LOW (ref 39.0–52.0)
Hemoglobin: 12.2 g/dL — ABNORMAL LOW (ref 13.0–17.0)
Hemoglobin: 11.2 g/dL — ABNORMAL LOW (ref 13.0–17.0)
Hemoglobin: 9.5 g/dL — ABNORMAL LOW (ref 13.0–17.0)
O2 Saturation: 98 %
O2 Saturation: 98 %
O2 Saturation: 99 %
Patient temperature: 98.6
Patient temperature: 100.4
Potassium: 5.1 mmol/L (ref 3.5–5.1)
Potassium: 4.6 mmol/L (ref 3.5–5.1)
Potassium: 4.4 mmol/L (ref 3.5–5.1)
Sodium: 135 mmol/L (ref 135–145)
Sodium: 133 mmol/L — ABNORMAL LOW (ref 135–145)
Sodium: 132 mmol/L — ABNORMAL LOW (ref 135–145)
TCO2: 23 mmol/L (ref 22–32)
TCO2: 21 mmol/L — ABNORMAL LOW (ref 22–32)
TCO2: 19 mmol/L — ABNORMAL LOW (ref 22–32)
pCO2 arterial: 40.7 mmHg (ref 32.0–48.0)
pCO2 arterial: 36.3 mmHg (ref 32.0–48.0)
pCO2 arterial: 33.9 mmHg (ref 32.0–48.0)
pH, Arterial: 7.328 — ABNORMAL LOW (ref 7.350–7.450)
pH, Arterial: 7.337 — ABNORMAL LOW (ref 7.350–7.450)
pH, Arterial: 7.337 — ABNORMAL LOW (ref 7.350–7.450)
pO2, Arterial: 116 mmHg — ABNORMAL HIGH (ref 83.0–108.0)
pO2, Arterial: 120 mmHg — ABNORMAL HIGH (ref 83.0–108.0)
pO2, Arterial: 133 mmHg — ABNORMAL HIGH (ref 83.0–108.0)

## 2020-12-14 LAB — GLUCOSE, CAPILLARY
Glucose-Capillary: 134 mg/dL — ABNORMAL HIGH (ref 70–99)
Glucose-Capillary: 153 mg/dL — ABNORMAL HIGH (ref 70–99)

## 2020-12-14 LAB — BASIC METABOLIC PANEL
Anion gap: 12 (ref 5–15)
BUN: 23 mg/dL — ABNORMAL HIGH (ref 6–20)
CO2: 21 mmol/L — ABNORMAL LOW (ref 22–32)
Calcium: 7.7 mg/dL — ABNORMAL LOW (ref 8.9–10.3)
Chloride: 101 mmol/L (ref 98–111)
Creatinine, Ser: 2.61 mg/dL — ABNORMAL HIGH (ref 0.61–1.24)
GFR, Estimated: 28 mL/min — ABNORMAL LOW (ref >60)
Glucose, Bld: 141 mg/dL — ABNORMAL HIGH (ref 70–99)
Potassium: 4.9 mmol/L (ref 3.5–5.1)
Sodium: 134 mmol/L — ABNORMAL LOW (ref 135–145)

## 2020-12-14 LAB — MAGNESIUM
Magnesium: 1.9 mg/dL (ref 1.7–2.4)
Magnesium: 1.7 mg/dL (ref 1.7–2.4)

## 2020-12-14 LAB — CREATININE, URINE, RANDOM: Creatinine, Urine: 131.28 mg/dL

## 2020-12-14 LAB — CK: Total CK: 14091 U/L — ABNORMAL HIGH (ref 49–397)

## 2020-12-14 LAB — PHOSPHORUS
Phosphorus: 6.5 mg/dL — ABNORMAL HIGH (ref 2.5–4.6)
Phosphorus: 6.3 mg/dL — ABNORMAL HIGH (ref 2.5–4.6)

## 2020-12-14 LAB — SODIUM, URINE, RANDOM: Sodium, Ur: <10 mmol/L

## 2020-12-14 LAB — TRIGLYCERIDES: Triglycerides: 364 mg/dL — ABNORMAL HIGH (ref <150)

## 2020-12-14 MED ORDER — FENTANYL CITRATE (PF) 100 MCG/2ML IJ SOLN: 25.0000 ug | INTRAMUSCULAR | Status: DC | PRN

## 2020-12-14 MED ORDER — PIVOT 1.5 CAL PO LIQD
1000.0000 mL | ORAL | Status: DC
  Administered 2020-12-14 – 2020-12-18 (×5): 1000 mL

## 2020-12-14 MED ORDER — FENTANYL CITRATE (PF) 100 MCG/2ML IJ SOLN
50.0000 ug | Freq: Once | INTRAMUSCULAR | Status: AC
  Administered 2020-12-14: 50 ug via INTRAVENOUS

## 2020-12-14 MED ORDER — LACTATED RINGERS IV BOLUS
2000.0000 mL | Freq: Once | INTRAVENOUS | Status: AC
  Administered 2020-12-14: 2000 mL via INTRAVENOUS

## 2020-12-14 MED ORDER — BACLOFEN 5 MG HALF TABLET
5.0000 mg | ORAL_TABLET | Freq: Two times a day (BID) | ORAL | Status: DC
  Administered 2020-12-14 – 2020-12-15 (×3): 5 mg
  Filled 2020-12-14 (×4): qty 1

## 2020-12-14 MED ORDER — PANTOPRAZOLE SODIUM 40 MG PO PACK
40.0000 mg | PACK | Freq: Every day | ORAL | Status: DC
  Administered 2020-12-14 – 2021-01-07 (×24): 40 mg
  Filled 2020-12-14 (×25): qty 20

## 2020-12-14 MED ORDER — DEXMEDETOMIDINE HCL IN NACL 400 MCG/100ML IV SOLN
0.0000 ug/kg/h | INTRAVENOUS | Status: DC
Start: 2020-12-14 — End: 2020-12-16
  Administered 2020-12-14: 0.2 ug/kg/h via INTRAVENOUS
  Administered 2020-12-14: 0.4 ug/kg/h via INTRAVENOUS
  Administered 2020-12-15 – 2020-12-16 (×2): 0.1 ug/kg/h via INTRAVENOUS
  Filled 2020-12-14 (×5): qty 100

## 2020-12-14 MED ORDER — FENTANYL BOLUS VIA INFUSION
50.0000 ug | INTRAVENOUS | Status: DC | PRN
  Administered 2020-12-15 – 2020-12-20 (×7): 50 ug via INTRAVENOUS
  Administered 2020-12-20 (×2): 100 ug via INTRAVENOUS
  Administered 2020-12-20 (×2): 50 ug via INTRAVENOUS
  Administered 2020-12-20 (×2): 100 ug via INTRAVENOUS
  Administered 2020-12-20: 50 ug via INTRAVENOUS
  Administered 2020-12-20: 100 ug via INTRAVENOUS
  Administered 2020-12-20 (×2): 50 ug via INTRAVENOUS
  Administered 2020-12-23: 75 ug via INTRAVENOUS
  Administered 2020-12-24 (×2): 50 ug via INTRAVENOUS
  Administered 2020-12-24: 75 ug via INTRAVENOUS
  Filled 2020-12-14: qty 100

## 2020-12-14 MED ORDER — NOREPINEPHRINE 4 MG/250ML-% IV SOLN
0.0000 ug/min | INTRAVENOUS | Status: DC
Start: 2020-12-14 — End: 2020-12-16
  Administered 2020-12-14 (×2): 15 ug/min via INTRAVENOUS
  Administered 2020-12-15: 28 ug/min via INTRAVENOUS
  Administered 2020-12-15 (×2): 30 ug/min via INTRAVENOUS
  Administered 2020-12-15: 13 ug/min via INTRAVENOUS
  Administered 2020-12-15: 23 ug/min via INTRAVENOUS
  Administered 2020-12-15: 16 ug/min via INTRAVENOUS
  Administered 2020-12-16: 12 ug/min via INTRAVENOUS
  Administered 2020-12-16: 8 ug/min via INTRAVENOUS
  Filled 2020-12-14 (×11): qty 250

## 2020-12-14 MED ORDER — LAMOTRIGINE 100 MG PO TABS
100.0000 mg | ORAL_TABLET | Freq: Two times a day (BID) | ORAL | Status: DC
  Administered 2020-12-14 – 2021-01-08 (×51): 100 mg
  Filled 2020-12-14 (×52): qty 1

## 2020-12-14 MED ORDER — LACTATED RINGERS IV SOLN: INTRAVENOUS | Status: AC

## 2020-12-14 MED ORDER — ENOXAPARIN SODIUM 40 MG/0.4ML IJ SOSY
40.0000 mg | PREFILLED_SYRINGE | INTRAMUSCULAR | Status: DC
  Administered 2020-12-14: 40 mg via SUBCUTANEOUS
  Filled 2020-12-14: qty 0.4

## 2020-12-14 MED ORDER — LEVETIRACETAM 100 MG/ML PO SOLN
750.0000 mg | Freq: Two times a day (BID) | ORAL | Status: DC
  Administered 2020-12-14 – 2020-12-15 (×3): 750 mg
  Filled 2020-12-14 (×3): qty 10

## 2020-12-14 MED ORDER — HYDROMORPHONE HCL 1 MG/ML IJ SOLN
1.0000 mg | INTRAMUSCULAR | Status: DC | PRN
Start: 2020-12-14 — End: 2020-12-25

## 2020-12-14 MED ORDER — GABAPENTIN 250 MG/5ML PO SOLN
300.0000 mg | Freq: Three times a day (TID) | ORAL | Status: DC
  Administered 2020-12-14 – 2020-12-15 (×3): 300 mg
  Filled 2020-12-14 (×4): qty 6

## 2020-12-14 MED ORDER — DOCUSATE SODIUM 50 MG/5ML PO LIQD
100.0000 mg | Freq: Two times a day (BID) | ORAL | Status: DC
  Administered 2020-12-14 – 2020-12-30 (×22): 100 mg
  Filled 2020-12-14 (×26): qty 10

## 2020-12-14 MED ORDER — CALCIUM GLUCONATE-NACL 2-0.675 GM/100ML-% IV SOLN
2.0000 g | Freq: Once | INTRAVENOUS | Status: AC
  Administered 2020-12-14: 2000 mg via INTRAVENOUS
  Filled 2020-12-14: qty 100

## 2020-12-14 MED ORDER — BACLOFEN 10 MG PO TABS
10.0000 mg | ORAL_TABLET | Freq: Two times a day (BID) | ORAL | Status: DC
  Filled 2020-12-14: qty 1

## 2020-12-14 MED ORDER — FENTANYL 2500MCG IN NS 250ML (10MCG/ML) PREMIX INFUSION
50.0000 ug/h | INTRAVENOUS | Status: DC
  Administered 2020-12-14: 50 ug/h via INTRAVENOUS
  Administered 2020-12-15: 75 ug/h via INTRAVENOUS
  Administered 2020-12-17 – 2020-12-18 (×3): 250 ug/h via INTRAVENOUS
  Administered 2020-12-19: 200 ug/h via INTRAVENOUS
  Administered 2020-12-19: 150 ug/h via INTRAVENOUS
  Administered 2020-12-20: 250 ug/h via INTRAVENOUS
  Administered 2020-12-20: 100 ug/h via INTRAVENOUS
  Administered 2020-12-21: 175 ug/h via INTRAVENOUS
  Administered 2020-12-21 – 2020-12-22 (×4): 200 ug/h via INTRAVENOUS
  Administered 2020-12-23: 175 ug/h via INTRAVENOUS
  Administered 2020-12-23 – 2020-12-25 (×3): 200 ug/h via INTRAVENOUS
  Administered 2020-12-25: 150 ug/h via INTRAVENOUS
  Filled 2020-12-14 (×23): qty 250

## 2020-12-14 MED ORDER — QUETIAPINE FUMARATE 100 MG PO TABS
200.0000 mg | ORAL_TABLET | Freq: Every day | ORAL | Status: DC
  Administered 2020-12-14 – 2021-01-08 (×26): 200 mg
  Filled 2020-12-14 (×3): qty 2
  Filled 2020-12-14 (×6): qty 1
  Filled 2020-12-14 (×3): qty 2
  Filled 2020-12-14 (×3): qty 1
  Filled 2020-12-14 (×2): qty 2
  Filled 2020-12-14: qty 1
  Filled 2020-12-14: qty 2
  Filled 2020-12-14: qty 1
  Filled 2020-12-14: qty 2
  Filled 2020-12-14: qty 1
  Filled 2020-12-14 (×2): qty 2
  Filled 2020-12-14 (×2): qty 1

## 2020-12-14 MED ORDER — SERTRALINE HCL 100 MG PO TABS
100.0000 mg | ORAL_TABLET | Freq: Every day | ORAL | Status: DC
  Administered 2020-12-14 – 2020-12-23 (×10): 100 mg
  Administered 2020-12-24: 50 mg
  Administered 2020-12-25 – 2021-01-08 (×15): 100 mg
  Filled 2020-12-14: qty 1
  Filled 2020-12-14 (×2): qty 2
  Filled 2020-12-14: qty 1
  Filled 2020-12-14 (×5): qty 2
  Filled 2020-12-14 (×2): qty 1
  Filled 2020-12-14: qty 2
  Filled 2020-12-14: qty 1
  Filled 2020-12-14: qty 2
  Filled 2020-12-14: qty 1
  Filled 2020-12-14: qty 2
  Filled 2020-12-14: qty 1
  Filled 2020-12-14 (×2): qty 2
  Filled 2020-12-14 (×3): qty 1
  Filled 2020-12-14 (×4): qty 2
  Filled 2020-12-14: qty 1

## 2020-12-14 MED ORDER — SODIUM CHLORIDE 0.9 % IV BOLUS
500.0000 mL | Freq: Once | INTRAVENOUS | Status: AC
  Administered 2020-12-14: 500 mL via INTRAVENOUS

## 2020-12-14 MED ORDER — HYDROXYZINE HCL 50 MG PO TABS
50.0000 mg | ORAL_TABLET | Freq: Two times a day (BID) | ORAL | Status: DC
  Administered 2020-12-14: 50 mg
  Filled 2020-12-14: qty 1

## 2020-12-14 MED FILL — Thrombin For Soln 5000 Unit: CUTANEOUS | Qty: 5000 | Status: AC

## 2020-12-14 NOTE — Progress Notes (Signed)
During morning rounds, Dr. Marcello Moores gave VO for ABG, 500cc bolus, and increase dilaudid PRN to '1mg'$  q2h.

## 2020-12-14 NOTE — Progress Notes (Addendum)
[facility] online Notification ID: [id number]  Gilmore Laroche, MSW, [facility]

## 2020-12-14 NOTE — Progress Notes (Addendum)
eLink Physician-Brief Progress Note Patient Name: KAUSTUBH SLUTZKY DOB: Dec 24, 1960 MRN: [id number]   Date of Service  12/14/2020  HPI/Events of Note  Notified of worsening creatinine, now at 4.05 <-- 2.61.   He is s/p L1-5 DLIFs and T10-pelvis fusion, L5-S1 ALIF, posterior osteotomies for deformity correction POD#1  Pt remains intubated and on fentanyl gtt.  RT also called to informed me that the patient was stacking breaths on PRVC.  He was placed on PS 10/5 and appears more comfortable on camera assessment.  eICU Interventions  Ok to keep on PS.  Check ABG at 10pm.  Pt is on LR'@250cc'$ /hr given rhabdomyolysis.  Repeat CK in the morning.  Renally dose medications.      Intervention Category Minor Interventions: Other:  Elsie [name] 12/14/2020, 7:56 PM  10:48 PM ABG 7.337/33.9/133 on PS 10/5.  Plan> Continue on current vent settings.

## 2020-12-14 NOTE — Progress Notes (Signed)
Bilateral lower extremity venous duplex completed. Refer to "CV Proc" under chart review to view preliminary results.  12/14/2020 4:26 PM Kelby Aline., MHA, RVT, RDCS, RDMS

## 2020-12-14 NOTE — Progress Notes (Signed)
   Assessed both right and left IJ prior to central line placement. Left IJ with smoke appearance in vessel that appears to be an acute clot formation. Attending notified. Due to recent surgery unable to anticoagulate. R IJ utilized for central line access.      JD Rexene Agent [facility] 12/14/2020, 3:27 PM  Please see [email or web address] for pager details.  From 7A-7P if no response, please call [phone]. After hours, please call ELink [phone].

## 2020-12-14 NOTE — Progress Notes (Signed)
Subjective: Pt started on levo gtt overnight  Objective: Vital signs in last 24 hours: Temp:  [97.4 F (36.3 C)-98.5 F (36.9 C)] 98.2 F (36.8 C) (08/05 0400) Pulse Rate:  [93-111] 103 (08/05 0721) Resp:  [16-21] 18 (08/05 0721) BP: (60-187)/(44-118) 111/73 (08/05 0721) SpO2:  [96 %-100 %] 99 % (08/05 0722) FiO2 (%):  [40 %] 40 % (08/05 0722)  Intake/Output from previous day: 08/04 0701 - 08/05 0700 In: 6196.8 [I.V.:4086.8; Blood:960; IV [id number] Out: [id number] [Urine:1600; Drains:540; Blood:3000] Intake/Output this shift: No intake/output data recorded.  Sedated on propofol Pupils 4 mm -> 2 mm bilaterally reactive No movement to stim  I/O 6.2/5.1 Hemovac drains-- 540 ml  Lab Results: Recent Labs    12/13/20 1955 12/13/20 2008 12/13/20 2234 12/14/20 0458  WBC 12.1*  --   --  16.3*  HGB 11.4*   < > 12.2* 12.9*  HCT 35.0*   < > 36.0* 37.8*  PLT 152  --   --  134*   < > = values in this interval not displayed.   BMET Recent Labs    12/13/20 0740 12/13/20 1513 12/13/20 2008 12/13/20 2234 12/14/20 0458  NA 134*   < > 135 135 134*  K 3.5   < > 5.1 5.1 4.9  CL 101  --  103  --  101  CO2 22  --   --   --  21*  GLUCOSE 116*  --  150*  --  141*  BUN 10  --  17  --  23*  CREATININE 1.05  --  1.50*  --  2.61*  CALCIUM 9.5  --   --   --  7.7*   < > = values in this interval not displayed.    Studies/Results: DG Lumbar Spine 2-3 Views  Result Date: 12/12/2020 CLINICAL DATA:  Lumbar fusion L1-2 L2-3, L3-4, and L4-5. EXAM: LUMBAR SPINE - 2-3 VIEW; DG C-ARM 1-60 MIN COMPARISON:  None. FINDINGS: Interbody spacers present at L1-2, L2-3, L3-4, and L4-5. No unexpected radiopaque foreign body is present. IMPRESSION: Status post lumbar fusion without radiographic evidence for complication. Electronically Signed   By: San Morelle M.D.   On: 12/12/2020 15:22   DG CHEST PORT 1 VIEW  Result Date: 12/13/2020 CLINICAL DATA:  Intubated EXAM: PORTABLE CHEST 1 VIEW  COMPARISON:  09/08/2020 FINDINGS: Endotracheal tube tip is about 3.5 cm superior to the carina. Esophageal tube tip below the diaphragm. Low lung volumes. No focal opacity, pleural effusion or pneumothorax IMPRESSION: 1. Endotracheal tube tip about 3.5 cm superior to carina 2. Clear lung fields Electronically Signed   By: Donavan Foil M.D.   On: 12/13/2020 22:26   DG Abd Portable 1V  Result Date: 12/14/2020 CLINICAL DATA:  Status post NG placement. EXAM: PORTABLE ABDOMEN - 1 VIEW COMPARISON:  Earlier radiograph dated 12/13/2020. FINDINGS: Partially visualized enteric tube with tip likely in the proximal body of the stomach. No bowel dilatation. There is extensive postsurgical changes of the spine with fusion hardware. The soft tissues are grossly unremarkable. Midline vertical cutaneous staples noted. IMPRESSION: Enteric tube with tip likely in the proximal body of the stomach. Electronically Signed   By: Anner Crete M.D.   On: 12/14/2020 01:03   DG Abd Portable 1V  Result Date: 12/13/2020 CLINICAL DATA:  OG tube placement EXAM: PORTABLE ABDOMEN - 1 VIEW COMPARISON:  12/13/2020 FINDINGS: Esophageal tube tip overlies proximal stomach, side-port in the region of GE junction. Nonobstructed upper gas pattern. IMPRESSION: Esophageal  tube side-port in the region of GE junction, consider further advancement by 5-10 cm for more optimal position Electronically Signed   By: Donavan Foil M.D.   On: 12/13/2020 22:25   DG C-Arm 1-60 Min  Result Date: 12/12/2020 CLINICAL DATA:  Lumbar fusion L1-2 L2-3, L3-4, and L4-5. EXAM: LUMBAR SPINE - 2-3 VIEW; DG C-ARM 1-60 MIN COMPARISON:  None. FINDINGS: Interbody spacers present at L1-2, L2-3, L3-4, and L4-5. No unexpected radiopaque foreign body is present. IMPRESSION: Status post lumbar fusion without radiographic evidence for complication. Electronically Signed   By: San Morelle M.D.   On: 12/12/2020 15:22   DG C-Arm 1-60 Min-No Report  Result Date:  12/13/2020 Fluoroscopy was utilized by the requesting physician.  No radiographic interpretation.    Assessment/Plan: 60 yo M s/p L1-5 DLIFs and T10-pelvis fusion, L5-S1 ALIF, posterior osteotomies for deformity correction POD#1 - I/Os positive over day but appears to be hypovolemic with recent oliguria and increasing creatinine - will need close monitoring of renal function - I have ordered a 500 ml bolus - lovenox for DVT ppx later today - wean sedation as tolerated - continue Foley given urethral stricture and dilation - cont hemovac drains  Keith Dixon 12/14/2020, 8:19 AM

## 2020-12-14 NOTE — Progress Notes (Signed)
Bilateral lower extremity venous duplex completed. Refer to "CV Proc" under chart review to view preliminary results.  12/14/2020 4:30 PM Kelby Aline., MHA, RVT, RDCS, RDMS

## 2020-12-14 NOTE — Progress Notes (Signed)
Date and time results received: 12/14/20 0700   Test: Creatinine  Critical Value: 4.05  Name of Provider Notified: E-Link MD  Orders Received? Or Actions Taken?: Awaiting new orders.

## 2020-12-14 NOTE — Consult Note (Signed)
NAME:  [name], MRN:  [id number], DOB:  [birth year], LOS: 2 ADMISSION DATE:  12/12/2020, CONSULTATION DATE:  12/14/20  REFERRING MD:  Grandville Silos, CHIEF COMPLAINT:   respiratory depression  History of Present Illness:  Keith Dixon is a 60 y/o M with PMH significant for severe degenerative scoliosis with multi-level listhesis, anxiety, bipolar disorder, PTSD, seizures, OSA and tobacco use who was admitted to neurosurgery for L1-2, L2-3, L3-4, L4-5 DLIFs initially and subsequent T10-pelvis decompression and fusion.   His initial surgery was 8/3 and was slow to wake up, after second  surgery 8/4  (10hr procedure) pt was again slow to awaken post-anesthesia, so decision was made to leave patient intubated overnight.  He was transfused two units PRBC intra-operatively.   Pertinent  Medical History  Anxiety Arthritis Bipolar 1 disorder Depressed History of posttraumatic stress disorder Hypertension  Seizures  Sleep apnea Smoker Tobacco use disorder.   Significant Hospital Events:  8/3 Admit to neurosurgery, underwent  anterior lateral Interbody fusion of L1-L5, notably had difficult foley insertion requiring urethral dilation 8/4 Stage 2 posterior decompression and fusion, left intubated post-procedure and PCCM consulted 8/5 AKI seen on am labs with evidence of volume depletion on POC [place]  Interim History / Subjective:  Sedated on vent   Objective   Blood pressure 111/73, pulse (!) 103, temperature 98.2 F (36.8 C), temperature source Oral, resp. rate 18, height 6' (1.829 m), weight 108.9 kg, SpO2 99 %.    Vent Mode: PSV;CPAP FiO2 (%):  [40 %] 40 % Set Rate:  [16 bmp-18 bmp] 18 bmp Vt Set:  [620 mL] 620 mL PEEP:  [5 cmH20] 5 cmH20 Pressure Support:  [10 cmH20] 10 cmH20 Plateau Pressure:  [16 cmH20-19 cmH20] 16 cmH20   Intake/Output Summary (Last 24 hours) at 12/14/2020 0853 Last data filed at 12/14/2020 0700 Gross per 24 hour  Intake 6196.75 ml  Output 5140 ml  Net 1056.75 ml     Filed Weights   12/12/20 0718  Weight: 108.9 kg   Physical Exam  General: Acute ill appearing elderly male lying in bed on mechanical ventilation, in NAD HEENT: ETT, MM pink/moist, PERRL,  Neuro: Sedated on vent  CV: s1s2 regular rate and rhythm, no murmur, rubs, or gallops,  PULM:  Diminished bilaterally, no added breath sounds, no increased work of breathing,  GI: soft, bowel sounds active in all 4 quadrants, non-tender, non-distended Extremities: warm/dry, no edema  Skin: no rashes or lesions  Resolved Hospital Problem list     Assessment & Plan:   Acute respiratory failure with hypoxia and hypercapnia due to post-op respiratory depression -Had encephalopathy after previous surgery due to chronic meds and anesthesia effects. -Post-anesthesia and prolonged surgical intervention today P: Continue ventilator support with lung protective strategies  Wean PEEP and FiO2 for sats greater than 90%. Head of bed elevated 30 degrees. Plateau pressures less than 30 cm H20.  Follow intermittent chest x-ray and ABG.   SAT/SBT as tolerated, mentation preclude extubation  Ensure adequate pulmonary hygiene  Follow cultures  VAP bundle in place  PAD protocol Minimize sedation    Acute Kidney Injury  -Likely pre-renal due to prolonged surgery and volume depletion  P: Follow renal function  Monitor urine output Trend Bmet Avoid nephrotoxins Ensure adequate renal perfusion  IV hydration Obtain urine lytes  Consider renal ultrasound  Check CK to rule out rabdo given prolong surgical time   Hypotension due to sedation and volume depletion with history of  hypertension -Bedside POC  ultrasound with evidence of volume depletion   P: Wean sedation  Continue pressor for MAP goal > 65 Home antihypertensives on hold 2L LR bolus now  Acute metabolic encephalopathy Hx of Seizures  Hx of Bipolar 1 disorder  P: Continue home Zoloft, Seroquel but reduces Baclofen and gabapentin to  avoid withdrawal PRN Flexeril, Oxy, and Dilaudid remain but will not be used while intubated  Maintain neuro protective measures; goal for eurothermia, euglycemia, eunatermia, normoxia, and PCO2 goal of 35-40 Nutrition and bowel regiment  Seizure precautions  Continue home Keprra  Aspirations precautions   History of tobacco abuse -needs tobacco cessation counseling when appropriate  Hypocalcemia P: Supplement  Trend Bemt    Leukocytosis  -Possibly hemodiluted given volume depletion P: Continue proteolytic Ancef per surgery Trend CBC and fever curve    Thrombocytopenia  -Plt count 134 P: Trend  Watch for signs of bleeding   Best Practice    Diet/type: NPO DVT prophylaxis: not indicated GI prophylaxis: PPI Lines: N/A Foley:  N/A Code Status:  full code Last date of multidisciplinary goals of care discussion: Continue to update family daily   Critical care time:   Performed by: Eliyah Bazzi D. Harris  Total critical care time:  42 minutes  Critical care time was exclusive of separately billable procedures and treating other patients.  Critical care was necessary to treat or prevent imminent or life-threatening deterioration.  Critical care was time spent personally by me on the following activities: development of treatment plan with patient and/or surrogate as well as nursing, discussions with consultants, evaluation of patient's response to treatment, examination of patient, obtaining history from patient or surrogate, ordering and performing treatments and interventions, ordering and review of laboratory studies, ordering and review of radiographic studies, pulse oximetry and re-evaluation of patient's condition.  Xaiver Roskelley D. Kenton Kingfisher, NP-C [place] [facility] Personal contact information can be found on Amion  12/14/2020, 8:58 AM

## 2020-12-14 NOTE — Progress Notes (Signed)
PT Cancellation Note  Patient Details Name: Keith Dixon MRN: [id number] DOB: 1960/05/23   Cancelled Treatment:    Reason Eval/Treat Not Completed: Patient not medically ready. Per communication with OT, pt is sedated & intubated - RN staff actively changing sedation in attempts to increase arousal. Will plan to follow-up as appropriate and time permits.   Moishe Spice, PT, DPT Acute Rehabilitation Services  Pager: [phone] Office: [place] 12/14/2020, 10:36 AM

## 2020-12-14 NOTE — Progress Notes (Signed)
Patient had some issues with low blood pressure around 2200. Levo titrated to get patient's BP into goal range. RN will continue to monitor BP closely via a-line.   Normajean Baxter, RN

## 2020-12-14 NOTE — Care Management (Signed)
[facility].  Patient is active with the [facility]; PCP is Dr. Dorene Sorrow.  Clinical social worker at [facility] is [employer], phone number [phone], extension [phone].  Reinaldo Raddle, RN, BSN  Trauma/Neuro ICU Case Manager [phone]

## 2020-12-14 NOTE — Progress Notes (Signed)
Initial Nutrition Assessment  DOCUMENTATION CODES:   Not applicable  INTERVENTION:   Initiate tube feeding via OG tube: Pivot 1.5 at 70 ml/h (1680 ml per day)  Provides 2520 kcal, 157 gm protein, 1275 ml free water daily   NUTRITION DIAGNOSIS:   Increased nutrient needs related to wound healing as evidenced by estimated needs.  GOAL:   Patient will meet greater than or equal to 90% of their needs  MONITOR:   TF tolerance  REASON FOR ASSESSMENT:   Consult Enteral/tube feeding initiation and management  ASSESSMENT:   Pt with PMH of anxiety, PTSD, bipolar 1 disorder, depression, HTN, Sz, sleep apnea, and a smoker who was admitted for severe scoliosis with multi-level listhesis.    Pt slow to wake after most recent procedure, left on vent. Pt now with AKI due to pre-renal, prolonged surgery and volume depletion.    8/3 s/p L1-2, L2-3, L3-4, L4-5, DLIFs 8/4 T10-pelvis (10 procedure)  Patient is currently intubated on ventilator support MV: 10.7 L/min Temp (24hrs), Avg:98.2 F (36.8 C), Min:97.4 F (36.3 C), Max:98.6 F (37 C)  Medications reviewed and include: vitamin D3, colace, protonix Precedex Fentanyl Levophed @ 9 mcg   Labs reviewed: Na 133, Cr: 2.61, TG: 364   OG: tip proximal stomach   Diet Order:   Diet Order             Diet NPO time specified  Diet effective now                   EDUCATION NEEDS:   Not appropriate for education at this time  Skin:  Skin Assessment: Reviewed RN Assessment  Last BM:  unknown  Height:   Ht Readings from Last 1 Encounters:  12/13/20 6' (1.829 m)    Weight:   Wt Readings from Last 1 Encounters:  12/12/20 108.9 kg    BMI:  Body mass index is 32.55 kg/m.  Estimated Nutritional Needs:   Kcal:  2200-2500  Protein:  130-145 grams  Fluid:  >2 L/day  Lockie Pares., RD, LDN, CNSC See AMiON for contact information

## 2020-12-14 NOTE — Procedures (Signed)
Arterial Catheter Insertion Procedure Note  Keith Dixon  [id number]  1960-12-10  Date:12/14/20  Time:3:23 PM    Provider Performing: Mick Sell    Procedure: Insertion of Arterial Line [phone]) with US guidance [id number])   Indication(s) Blood pressure monitoring and/or need for frequent ABGs  Consent Risks of the procedure as well as the alternatives and risks of each were explained to the patient and/or caregiver.  Consent for the procedure was obtained and is signed in the bedside chart  Anesthesia None   Time Out Verified patient identification, verified procedure, site/side was marked, verified correct patient position, special equipment/implants available, medications/allergies/relevant history reviewed, required imaging and test results available.   Sterile Technique Maximal sterile technique including full sterile barrier drape, hand hygiene, sterile gown, sterile gloves, mask, hair covering, sterile ultrasound probe cover (if used).   Procedure Description Area of catheter insertion was cleaned with chlorhexidine and draped in sterile fashion. With real-time ultrasound guidance an arterial catheter was placed into the right radial artery.  Appropriate arterial tracings confirmed on monitor.     Complications/Tolerance None; patient tolerated the procedure well.   EBL Minimal   Specimen(s) None  JD Rexene Agent [place] 12/14/2020, 3:24 PM  Please see [email or web address] for pager details.  From 7A-7P if no response, please call [phone]. After hours, please call ELink [phone].

## 2020-12-14 NOTE — Progress Notes (Signed)
OT Cancellation Note  Patient Details Name: Keith Dixon MRN: [id number] DOB: [birth year]   Cancelled Treatment:    Reason Eval/Treat Not Completed: Patient not medically ready (sedated intubated - RN staff actively changing sedation in attempts to increase arousal)  Billey Chang, OTR/L  Acute Rehabilitation Services Pager: [phone] Office: [phone] .  12/14/2020, 9:18 AM

## 2020-12-14 NOTE — Procedures (Signed)
Central Venous Catheter Insertion Procedure Note  ERCELL BESHARA  [id number]  1960-07-13  Date:12/14/20  Time:3:21 PM   Provider Performing:Arlind Klingerman D Rollene Rotunda   Procedure: Insertion of Non-tunneled Central Venous [phone]) with US guidance [id number])   Indication(s) Medication administration  Consent Risks of the procedure as well as the alternatives and risks of each were explained to the patient and/or caregiver.  Consent for the procedure was obtained and is signed in the bedside chart  Anesthesia Topical only with 1% lidocaine   Timeout Verified patient identification, verified procedure, site/side was marked, verified correct patient position, special equipment/implants available, medications/allergies/relevant history reviewed, required imaging and test results available.  Sterile Technique Maximal sterile technique including full sterile barrier drape, hand hygiene, sterile gown, sterile gloves, mask, hair covering, sterile ultrasound probe cover (if used).  Procedure Description Area of catheter insertion was cleaned with chlorhexidine and draped in sterile fashion.  With real-time ultrasound guidance a central venous catheter was placed into the right internal jugular vein. Nonpulsatile blood flow and easy flushing noted in all ports.  The catheter was sutured in place and sterile dressing applied.  Picture of Right IJ with catheter wire in correct position     Complications/Tolerance None; patient tolerated the procedure well. Chest X-ray is ordered to verify placement for internal jugular or subclavian cannulation.   Chest x-ray is not ordered for femoral cannulation.   EBL Minimal  Specimen(s) None  JD Rexene Agent [place] [facility] 12/14/2020, 3:23 PM  Please see [email or web address] for pager details.  From 7A-7P if no response, please call [phone]. After hours, please call ELink [phone].

## 2020-12-14 NOTE — Progress Notes (Signed)
PCCM Progress Note  Afternoon labs collected and reviewed, CK elevated at 14,091 indicating patient is in [place] in the setting of recent prolonged surgery.   Will add large volume IV resuscitation now and follow CK  Also notified by bedside nurse that patient is also positive for DVT, formal result pending.  Brenlynn Fake D. Kenton Kingfisher, NP-C [place] [facility] Personal contact information can be found on Amion  12/14/2020, 4:26 PM

## 2020-12-14 NOTE — Progress Notes (Signed)
Patient's pressure began to drop around 2300. RN requested pressor from CCM at bedside in order to keep the patient sedated through the night while vented (plan is to extubate tom - 8/5) - Levo ordered and titrated to get patient's BP to MAP > 70. RN will continue to monitor pressure closely throughout the night.   Normajean Baxter, RN

## 2020-12-15 ENCOUNTER — Encounter (HOSPITAL_COMMUNITY): Payer: Self-pay | Admitting: Neurosurgery

## 2020-12-15 DIAGNOSIS — M6282 Rhabdomyolysis: Secondary | ICD-10-CM | POA: Diagnosis not present

## 2020-12-15 DIAGNOSIS — N179 Acute kidney failure, unspecified: Secondary | ICD-10-CM | POA: Diagnosis not present

## 2020-12-15 DIAGNOSIS — M4126 Other idiopathic scoliosis, lumbar region: Secondary | ICD-10-CM | POA: Diagnosis not present

## 2020-12-15 LAB — BASIC METABOLIC PANEL
Anion gap: 10 (ref 5–15)
BUN: 44 mg/dL — ABNORMAL HIGH (ref 6–20)
CO2: 19 mmol/L — ABNORMAL LOW (ref 22–32)
Calcium: 7.1 mg/dL — ABNORMAL LOW (ref 8.9–10.3)
Chloride: 101 mmol/L (ref 98–111)
Creatinine, Ser: 4.91 mg/dL — ABNORMAL HIGH (ref 0.61–1.24)
GFR, Estimated: 13 mL/min — ABNORMAL LOW (ref >60)
Glucose, Bld: 164 mg/dL — ABNORMAL HIGH (ref 70–99)
Potassium: 4.4 mmol/L (ref 3.5–5.1)
Sodium: 130 mmol/L — ABNORMAL LOW (ref 135–145)

## 2020-12-15 LAB — GLUCOSE, CAPILLARY
Glucose-Capillary: 153 mg/dL — ABNORMAL HIGH (ref 70–99)
Glucose-Capillary: 161 mg/dL — ABNORMAL HIGH (ref 70–99)
Glucose-Capillary: 147 mg/dL — ABNORMAL HIGH (ref 70–99)
Glucose-Capillary: 124 mg/dL — ABNORMAL HIGH (ref 70–99)
Glucose-Capillary: 125 mg/dL — ABNORMAL HIGH (ref 70–99)
Glucose-Capillary: 119 mg/dL — ABNORMAL HIGH (ref 70–99)

## 2020-12-15 LAB — CK TOTAL AND CKMB (NOT AT ARMC)
CK, MB: 37.8 ng/mL — ABNORMAL HIGH (ref 0.5–5.0)
Relative Index: 0.3 (ref 0.0–2.5)
Total CK: 11986 U/L — ABNORMAL HIGH (ref 49–397)

## 2020-12-15 LAB — CBC
HCT: 26.6 % — ABNORMAL LOW (ref 39.0–52.0)
Hemoglobin: 8.9 g/dL — ABNORMAL LOW (ref 13.0–17.0)
MCH: 28.3 pg (ref 26.0–34.0)
MCHC: 33.5 g/dL (ref 30.0–36.0)
MCV: 84.4 fL (ref 80.0–100.0)
Platelets: UNDETERMINED 10*3/uL (ref 150–400)
RBC: 3.15 MIL/uL — ABNORMAL LOW (ref 4.22–5.81)
RDW: 15.8 % — ABNORMAL HIGH (ref 11.5–15.5)
WBC: 14 10*3/uL — ABNORMAL HIGH (ref 4.0–10.5)
nRBC: 0 % (ref 0.0–0.2)

## 2020-12-15 LAB — PHOSPHORUS: Phosphorus: 5.5 mg/dL — ABNORMAL HIGH (ref 2.5–4.6)

## 2020-12-15 LAB — MAGNESIUM: Magnesium: 1.9 mg/dL (ref 1.7–2.4)

## 2020-12-15 MED ORDER — FUROSEMIDE 10 MG/ML IJ SOLN
80.0000 mg | Freq: Once | INTRAMUSCULAR | Status: AC
  Administered 2020-12-15: 80 mg via INTRAVENOUS
  Filled 2020-12-15: qty 8

## 2020-12-15 MED ORDER — GABAPENTIN 250 MG/5ML PO SOLN
300.0000 mg | Freq: Two times a day (BID) | ORAL | Status: DC
  Administered 2020-12-15 – 2020-12-22 (×13): 300 mg
  Filled 2020-12-15 (×14): qty 6

## 2020-12-15 MED ORDER — HEPARIN (PORCINE) 25000 UT/250ML-% IV SOLN
1500.0000 [IU]/h | INTRAVENOUS | Status: DC
  Administered 2020-12-15: 1500 [IU]/h via INTRAVENOUS
  Filled 2020-12-15: qty 250

## 2020-12-15 MED ORDER — LACTATED RINGERS IV SOLN: INTRAVENOUS | Status: DC

## 2020-12-15 MED ORDER — ALBUMIN HUMAN 25 % IV SOLN
12.5000 g | Freq: Once | INTRAVENOUS | Status: AC
  Administered 2020-12-15: 12.5 g via INTRAVENOUS
  Filled 2020-12-15: qty 50

## 2020-12-15 MED ORDER — CEFAZOLIN SODIUM-DEXTROSE 1-4 GM/50ML-% IV SOLN
1.0000 g | Freq: Two times a day (BID) | INTRAVENOUS | Status: DC
  Administered 2020-12-15 – 2020-12-16 (×2): 1 g via INTRAVENOUS
  Filled 2020-12-15: qty 50

## 2020-12-15 MED ORDER — PROPOFOL 1000 MG/100ML IV EMUL
INTRAVENOUS | Status: AC
  Filled 2020-12-15: qty 100

## 2020-12-15 MED ORDER — LEVETIRACETAM 100 MG/ML PO SOLN
500.0000 mg | Freq: Two times a day (BID) | ORAL | Status: DC
  Administered 2020-12-15 – 2020-12-16 (×2): 500 mg
  Filled 2020-12-15 (×2): qty 5

## 2020-12-15 NOTE — Progress Notes (Signed)
Neurosurgery Service Progress Note  Subjective: No acute events overnight, still very pressor-dependent   Objective: Vitals:   12/15/20 0324 12/15/20 0400 12/15/20 0500 12/15/20 0600  BP:  (!) 103/59 111/62 104/60  Pulse:  (!) 102 99 (!) 101  Resp:  '15 15 16  '$ Temp:  100 F (37.8 C)    TempSrc:  Oral    SpO2: 98% 100% 99% 97%  Weight:   104.9 kg   Height:        Physical Exam: Intubated, eyes open to voice, not FC but w/d x4  Assessment & Plan: 60 y.o. man POD2 s/p multi-approach T10-pelvis.  -drains with 700cc in past 24h, cont drains -PCCM recs -U/s w/ RLE SVT in R saphenous, DVT up to the profunda, CCM bedside U/S for line placement w/ L IJ thrombus -difficult situation with the DVTs x2 w/ occlusion of much of the venous system in the RLE, but he has ARF, is intubated / non-ambulatory, is acutely ill, and the profundus DVT is high risk for PE. He has drains in place to help with any anticoagulation-related bleeding, a DVT filter won't treat the IJ thrombus. He is already on pressors and a large thrombus with hemodynamic compromise could certainly be fatal. With pressors off for a few seconds last night while changing his drip, his pressures tanked. I think that the benefits outweigh the risks and think we should proceed with anticoagulation with a heparin gtt.   Judith Part  12/15/20 7:38 AM

## 2020-12-15 NOTE — Progress Notes (Signed)
Neurosurgery  Pt seen and examined.  On sedation including Fentanyl, as well as levophed.  Eyes open to stim.  He follows commands x 4, wiggling his feet and toes equally. He has right leg swelling, non-tense.  I had a discussion with my partner Dr. Zada Finders and Dr. Tacy Learn.  With his recent surgery and large wound, continuing heparin gtt will lead to almost certain hematoma and/or significant blood loss that may result in neurologic compromise or hemodynamically significant hemorrhage-- he still has significant drain output.  His bedside echo did not show any RV strain or evidence of hemodynamically significant PE.  As such, it would be best to hold off on anticoagulation at least for a few additional days and place an IVC filter now.  I updated his son and daughter who verbalized understanding.

## 2020-12-15 NOTE — Progress Notes (Signed)
ANTICOAGULATION CONSULT NOTE - Initial Consult  Pharmacy Consult for heparin Indication: DVT  No Known Allergies  Patient Measurements: Height: 6' (182.9 cm) Weight: 104.9 kg (231 lb 4.2 oz) IBW/kg (Calculated) : 77.6 Heparin Dosing Weight: TBW  Vital Signs: Temp: 97.9 F (36.6 C) (08/06 0800) Temp Source: Oral (08/06 0800) BP: 106/62 (08/06 0812) Pulse Rate: 89 (08/06 0815)  Labs: Recent Labs    12/13/20 1955 12/13/20 2008 12/14/20 0458 12/14/20 0851 12/14/20 1745 12/14/20 2154 12/15/20 0459  HGB 11.4*   < > 12.9* 11.2* 10.4* 9.5*  --   HCT 35.0*   < > 37.8* 33.0* 30.5* 28.0*  --   PLT 152  --  134*  --  ACLMP  --   --   CREATININE  --    < > 2.61*  --  4.05*  --  4.91*  CKTOTAL  --   --  14,091*  --   --   --  11,986*  CKMB  --   --   --   --   --   --  37.8*   < > = values in this interval not displayed.    Estimated Creatinine Clearance: 20.5 mL/min (A) (by C-G formula based on SCr of 4.91 mg/dL (H)).   Medical History: Past Medical History:  Diagnosis Date   Anxiety    Arthritis    Bipolar 1 disorder, depressed ([place])    Bipolar disorder ([place])    Depression    History of posttraumatic stress disorder (PTSD)    Hypertension    PTSD (post-traumatic stress disorder)    Seizures ([place])    Sleep apnea    Smoker    Tobacco use disorder     Medications:  Medications Prior to Admission  Medication Sig Dispense Refill Last Dose   acetaminophen (TYLENOL) 500 MG tablet Take 2 tablets (1,000 mg total) by mouth every 6 (six) hours as needed for mild pain. 30 tablet 0 12/12/2020 at 0500   amLODipine (NORVASC) 10 MG tablet Take 10 mg by mouth in the morning.   12/12/2020 at 0500   baclofen (LIORESAL) 10 MG tablet Take 10 mg by mouth 2 (two) times daily.   12/12/2020 at 0500   Cholecalciferol (VITAMIN D3) 50 MCG (2000 UT) TABS Take 2,000 Units by mouth in the morning and at bedtime.   Past Week   gabapentin (NEURONTIN) 300 MG capsule Take 900 mg by mouth 2 (two) times  daily.   12/12/2020 at 0500   hydrOXYzine (VISTARIL) 50 MG capsule Take 50 mg by mouth in the morning and at bedtime.   12/12/2020 at 0500   lamoTRIgine (LAMICTAL) 100 MG tablet Take 1 tablet (100 mg total) by mouth 2 (two) times daily. 60 tablet 2 12/12/2020 at 0500   levETIRAcetam (KEPPRA) 750 MG tablet Take 1,500 mg by mouth 2 (two) times daily.   12/12/2020 at 0500   lisinopril-hydrochlorothiazide (ZESTORETIC) 20-12.5 MG tablet Take 1 tablet by mouth in the morning.   12/11/2020   meloxicam (MOBIC) 15 MG tablet Take 15 mg by mouth daily after breakfast.   12/11/2020   QUEtiapine (SEROQUEL) 200 MG tablet Take 200 mg by mouth at bedtime.   12/11/2020   sertraline (ZOLOFT) 100 MG tablet Take 100 mg by mouth daily after breakfast.   12/12/2020 at 0500    Assessment: 36 yom admitted with back and leg pain now s/p anterior lateral interbody fusion of L1-L5 on 8/3 and posterior decompression and fusion on 8/4. He was  found to have RLE and L IJ DVTs. Benefit of IV heparin determined to outweigh risk and Neurosurgery has approved use. Also noted are AKI and rhabdomyolysis in setting of prolonged surgery. Pharmacy consulted to manage IV heparin. SCr up to 4.91, CrCl ~20 ml/min. Hgb has trended down to 8.9, platelets were clumped this morning.  Goal of Therapy:  Heparin level 0.3-0.5 units/ml Monitor platelets by anticoagulation protocol: Yes   Plan:  D/C enoxaparin Begin heparin drip at 1500 units/hr - no bolus 8h heparin level Daily heparin level, CBC Monitor for s/sx of bleeding   Thank you for involving pharmacy in this patient's care.  Renold Genta, PharmD, BCPS Clinical Pharmacist Clinical phone for 12/15/2020 until 3p is [phone] 12/15/2020 8:33 AM  **Pharmacist phone directory can be found on [email or web address] listed under [place]**

## 2020-12-15 NOTE — Progress Notes (Signed)
OT Cancellation Note  Patient Details Name: DERRIS VANDEWATER MRN: [id number] DOB: Oct 30, 1960   Cancelled Treatment:    Reason Eval/Treat Not Completed: Patient not medically ready due to DVT x 2 in RLE and IVJ, sedation lowered but not following commands.  RN recommends OT sign off at this, please re-consult once medically stable.   Jolaine Artist, OT Acute Rehabilitation Services Pager [phone] Office [place] 12/15/2020, 11:17 AM

## 2020-12-15 NOTE — Progress Notes (Signed)
PHARMACY NOTE:  ANTIMICROBIAL RENAL DOSAGE ADJUSTMENT  Current antimicrobial regimen includes a mismatch between antimicrobial dosage and estimated renal function.  As per policy approved by the Pharmacy & Therapeutics and Medical Executive Committees, the antimicrobial dosage will be adjusted accordingly.  Current antimicrobial dosage:  cefazolin 1 g IV q8h thru 8/8 02:00  Indication: post-op prophylaxis   Renal Function:  Estimated Creatinine Clearance: 20.5 mL/min (A) (by C-G formula based on SCr of 4.91 mg/dL (H)). '[]'$      On intermittent HD, scheduled: '[]'$      On CRRT    Antimicrobial dosage has been changed to:  cefazolin 1 g IV q12h thru 8/8 02:00  Additional comments: also adjusted gabapentin and Keppra per MD   Thank you for involving pharmacy in this patient's care.  Renold Genta, PharmD, BCPS Clinical Pharmacist Clinical phone for 12/15/2020 until 3p is [phone] 12/15/2020 11:48 AM  **Pharmacist phone directory can be found on [email or web address] listed under [place]**

## 2020-12-15 NOTE — Progress Notes (Signed)
PT Cancellation Note  Patient Details Name: Keith Dixon MRN: [id number] DOB: Sep 18, 1960   Cancelled Treatment:    Reason Eval/Treat Not Completed: Patient not medically ready due to presence of DVT x2 in both RLE and IVJ. RN recommends PT sign off at this time with re-consult once more medically stable.   Inocencio Homes, PT, DPT   Acute Rehabilitation Department Pager #: [phone]   Otho Bellows 12/15/2020, 10:49 AM

## 2020-12-15 NOTE — Progress Notes (Signed)
NAME:  Keith Dixon, MRN:  [id number], DOB:  1960-06-17, LOS: 3 ADMISSION DATE:  12/12/2020, CONSULTATION DATE:  12/15/20  REFERRING MD:  Grandville Silos, CHIEF COMPLAINT:   respiratory depression  History of Present Illness:  Keith Dixon is a 60 y/o M with PMH significant for severe degenerative scoliosis with multi-level listhesis, anxiety, bipolar disorder, PTSD, seizures, OSA and tobacco use who was admitted to neurosurgery for L1-2, L2-3, L3-4, L4-5 DLIFs initially and subsequent T10-pelvis decompression and fusion.   His initial surgery was 8/3 and was slow to wake up, after second  surgery 8/4  (10hr procedure) pt was again slow to awaken post-anesthesia, so decision was made to leave patient intubated overnight.  He was transfused two units PRBC intra-operatively.   Significant Hospital Events:  8/3 Admit to neurosurgery, underwent  anterior lateral Interbody fusion of L1-L5, notably had difficult foley insertion requiring urethral dilation 8/4 Stage 2 posterior decompression and fusion, left intubated post-procedure and PCCM consulted 8/5 AKI seen on am labs with evidence of volume depletion on POC [place] 8/6: Patient AKI was due to acute rhabdomyolysis in the setting of prolonged surgery, he remained oliguric.  Ultrasound lower extremity showed acute right lower extremity DVT and left IJ DVT  Interim History / Subjective:  Patient serum creatinine continue to trend up, he remained oliguric Ultrasound showed acute right lower extremity DVT and left IJ DVT CK level >14k  Objective   Blood pressure 106/62, pulse 89, temperature 97.9 F (36.6 C), temperature source Oral, resp. rate 20, height 6' (1.829 m), weight 104.9 kg, SpO2 100 %.    Vent Mode: PSV;CPAP FiO2 (%):  [40 %] 40 % Set Rate:  [18 bmp] 18 bmp Vt Set:  [620 mL] 620 mL PEEP:  [5 cmH20] 5 cmH20 Pressure Support:  [8 cmH20-15 cmH20] 8 cmH20 Plateau Pressure:  [16 cmH20] 16 cmH20   Intake/Output Summary (Last 24 hours) at  12/15/2020 1009 Last data filed at 12/15/2020 0800 Gross per 24 hour  Intake 8605.56 ml  Output 735 ml  Net 7870.56 ml   Filed Weights   12/12/20 0718 12/15/20 0500  Weight: 108.9 kg 104.9 kg   Physical Exam  General: Critically ill-appearing middle-aged African-American male, lying in the bed, orally intubated  HEENT: Atraumatic, normocephalic ETT, MM pink/moist, PERRL,  Neuro: Opens eyes with vocal stimuli, does not follow commands, flicker movement in right lower extremity, no response to pain in all other extremities CV: s1s2 regular rate and rhythm, no murmur, rubs, or gallops,  PULM:  Diminished bilaterally, no added breath sounds, no increased work of breathing,  GI: soft, bowel sounds active in all 4 quadrants, non-tender, non-distended Extremities: warm/dry, no edema  Skin: no rashes or lesions  Resolved Hospital Problem list     Assessment & Plan:  Acute respiratory failure with hypoxia and hypercapnia due to post-op respiratory depression Continue ventilator support with lung protective strategies  Currently on pressure support trial, tolerating well Mental status remain poor, precludes extubation Head of bed elevated 30 degrees. Peak, plateau and driving pressures are at goal Ensure adequate pulmonary hygiene  Follow cultures  VAP bundle in place  PAD protocol Minimize sedation    Acute rhabdomyolysis in the setting of prolonged surgery Oliguric acute renal failure due to rhabdomyolysis Patient is not making much urine Foley catheter is in place Nephrology consulted Follow renal function  Monitor urine output Avoid nephrotoxins Ensure adequate renal perfusion  Continue aggressive IV fluid with LR 250/h Obtain urine lytes  Lumbar spinal stenosis status post decompression and fusion surgery Drain in place with high output Closely monitor drain output Monitor H&H Neurosurgery is following, defer management to primary team  Hypotension due to sedation and  volume depletion  Patient is requiring IV vasopressors to maintain map goal of 65 Continue to titrate vasopressors Wean sedation  Home antihypertensives on hold Continue IV fluid  Acute right lower extremity DVT, acute left IJ DVT Started on IV heparin infusion after clearance from neurosurgery Monitor PTT  Acute metabolic encephalopathy Hx of Seizures  Hx of Bipolar 1 disorder  Continue home Zoloft, Seroquel but reduces Baclofen and gabapentin to avoid withdrawal PRN Flexeril, Oxy, and Dilaudid remain but will not be used while intubated  Maintain neuro protective measures; goal for eurothermia, euglycemia, eunatermia, normoxia, and PCO2 goal of 35-40 Nutrition and bowel regiment  Seizure precautions  Continue home Keprra  Aspirations precautions   Tobacco dependence Smoking cessation counseling, once extubated  Hypocalcemia/hyponatremia/hyperphosphatemia Closely monitor electrolytes and supplement   Best Practice    Diet/type: NPO DVT prophylaxis: Systemic heparin GI prophylaxis: PPI Lines: Yes, still needed Foley: Yes, still needed Code Status:  full code Last date of multidisciplinary goals of care discussion: Continue to update family daily   Critical care time:   Total critical care time: 44 minutes  Performed by: Jacky Kindle   Critical care time was exclusive of separately billable procedures and treating other patients.   Critical care was necessary to treat or prevent imminent or life-threatening deterioration.   Critical care was time spent personally by me on the following activities: development of treatment plan with patient and/or surrogate as well as nursing, discussions with consultants, evaluation of patient's response to treatment, examination of patient, obtaining history from patient or surrogate, ordering and performing treatments and interventions, ordering and review of laboratory studies, ordering and review of radiographic studies, pulse  oximetry and re-evaluation of patient's condition.   Jacky Kindle MD [place] [facility] See Amion for pager If no response to pager, please call [phone] until 7pm After 7pm, Please call E-link [phone]

## 2020-12-15 NOTE — Consult Note (Signed)
Reason for Consult: Acute kidney injury Referring Physician: Jacky Kindle MD (CCM)  HPI:  60 year old African-American man with past history significant for bipolar disorder, seizure disorder, PTSD, obstructive sleep apnea, tobacco use and severe degenerative scoliosis with multilevel listhesis.  He was admitted 3 days ago and underwent anterior lateral interbody fusion of L1/L2, L2/L3, L3/L4 and L4/L5 along with cystoscopy/urethral dilation for difficult Foley catheter placement.  He then underwent T10 to iliac fusion, L5/S1 fusion and posterior osteotomies for deformity correction and decompression of L2-L5 the following day.  His first surgery was complicated by difficulty waking up and second surgery was complicated by 2 L blood loss with 10-hour procedure time and inability to extubate postoperatively.  His baseline creatinine that is 0.9-1.1 rose to 2.6 and then 4.0 yesterday and today is up to 4.9.  His urine output is in severely oliguric range and he remains pressor dependent.  His CPK level yesterday was 14,000 and today is down to 12,000.  Past Medical History:  Diagnosis Date   Anxiety    Arthritis    Bipolar 1 disorder, depressed ([place])    Bipolar disorder ([place])    Depression    History of posttraumatic stress disorder (PTSD)    Hypertension    PTSD (post-traumatic stress disorder)    Seizures ([place])    Sleep apnea    Smoker    Tobacco use disorder     Past Surgical History:  Procedure Laterality Date   ANTERIOR LATERAL LUMBAR FUSION 4 LEVELS N/A 12/12/2020   Procedure: Anterior Lateral Interbody Fusion Lumbar One-Two, Lumbar Two-Three, Lumbar Three-Four, Lumbar Four-Five;  Surgeon: Franchot Gallo, MD;  Location: [facility];  Service: Urology;  Laterality: N/A;  Anterior Lateral Interbody Fusion Lumbar One-Two, Lumbar Two-Three, Lumbar Three-Four, Lumbar Four-Five   CYSTOSCOPY  12/12/2020   Procedure: CYSTOSCOPY, URETHRAL DILATION, DIFFICULT FOLEY INSERTION;  Surgeon: Franchot Gallo, MD;  Location: [facility] OR;  Service: Urology;;    Family History  Problem Relation Age of Onset   Seizures Mother    Breast cancer Mother 64   Hypertension Brother     Social History:  reports that he has been smoking cigarettes. He has a 10.00 pack-year smoking history. He has never used smokeless tobacco. He reports current alcohol use of about 2.0 standard drinks of alcohol per week. He reports current drug use. Drug: Marijuana.  Allergies: No Known Allergies  Medications: I have reviewed the patient's current medications. Scheduled:  baclofen  5 mg Per Tube BID   chlorhexidine gluconate (MEDLINE KIT)  15 mL Mouth Rinse BID   Chlorhexidine Gluconate Cloth  6 each Topical Daily   cholecalciferol  2,000 Units Per Tube BID AC & HS   docusate  100 mg Per Tube BID   feeding supplement (PIVOT 1.5 CAL)  1,000 mL Per Tube Q24H   gabapentin  300 mg Per Tube Q12H   lamoTRIgine  100 mg Per Tube BID   levETIRAcetam  500 mg Per Tube BID   mouth rinse  15 mL Mouth Rinse 10 times per day   pantoprazole sodium  40 mg Per Tube QHS   QUEtiapine  200 mg Per Tube QHS   sertraline  100 mg Per Tube QPC breakfast   sodium chloride flush  3 mL Intravenous Q12H   BMP Latest Ref Rng & Units 12/15/2020 12/14/2020 12/14/2020  Glucose 70 - 99 mg/dL 164(H) - 152(H)  BUN 6 - 20 mg/dL 44(H) - 32(H)  Creatinine 0.61 - 1.24 mg/dL 4.91(H) - 4.05(H)  BUN/Creat Ratio 6 - 22 (calc) - - -  Sodium 135 - 145 mmol/L 130(L) 132(L) 132(L)  Potassium 3.5 - 5.1 mmol/L 4.4 4.4 4.7  Chloride 98 - 111 mmol/L 101 - 104  CO2 22 - 32 mmol/L 19(L) - 18(L)  Calcium 8.9 - 10.3 mg/dL 7.1(L) - 7.5(L)   CBC Latest Ref Rng & Units 12/15/2020 12/14/2020 12/14/2020  WBC 4.0 - 10.5 K/uL 14.0(H) - 15.4(H)  Hemoglobin 13.0 - 17.0 g/dL 8.9(L) 9.5(L) 10.4(L)  Hematocrit 39.0 - 52.0 % 26.6(L) 28.0(L) 30.5(L)  Platelets 150 - 400 K/uL PLATELET CLUMPS NOTED ON SMEAR, UNABLE TO ESTIMATE - ACLMP   DG CHEST PORT 1 VIEW  Result Date:  12/14/2020 CLINICAL DATA:  Central line placement EXAM: PORTABLE CHEST 1 VIEW COMPARISON:  12/13/2020 FINDINGS: Interval placement of right IJ central venous catheter with distal tip terminating at the level of the distal SVC. ET tube terminates approximately 5.7 cm above the carina. Enteric tube terminates within the stomach. Stable heart size. Low lung volumes. No pneumothorax. Midline surgical staples. IMPRESSION: Interval placement of right IJ central venous catheter. No pneumothorax. Electronically Signed   By: Davina Poke D.O.   On: 12/14/2020 15:43   DG CHEST PORT 1 VIEW  Result Date: 12/13/2020 CLINICAL DATA:  Intubated EXAM: PORTABLE CHEST 1 VIEW COMPARISON:  09/08/2020 FINDINGS: Endotracheal tube tip is about 3.5 cm superior to the carina. Esophageal tube tip below the diaphragm. Low lung volumes. No focal opacity, pleural effusion or pneumothorax IMPRESSION: 1. Endotracheal tube tip about 3.5 cm superior to carina 2. Clear lung fields Electronically Signed   By: Donavan Foil M.D.   On: 12/13/2020 22:26   DG Abd Portable 1V  Result Date: 12/14/2020 CLINICAL DATA:  Status post NG placement. EXAM: PORTABLE ABDOMEN - 1 VIEW COMPARISON:  Earlier radiograph dated 12/13/2020. FINDINGS: Partially visualized enteric tube with tip likely in the proximal body of the stomach. No bowel dilatation. There is extensive postsurgical changes of the spine with fusion hardware. The soft tissues are grossly unremarkable. Midline vertical cutaneous staples noted. IMPRESSION: Enteric tube with tip likely in the proximal body of the stomach. Electronically Signed   By: Anner Crete M.D.   On: 12/14/2020 01:03   DG Abd Portable 1V  Result Date: 12/13/2020 CLINICAL DATA:  OG tube placement EXAM: PORTABLE ABDOMEN - 1 VIEW COMPARISON:  12/13/2020 FINDINGS: Esophageal tube tip overlies proximal stomach, side-port in the region of GE junction. Nonobstructed upper gas pattern. IMPRESSION: Esophageal tube side-port in  the region of GE junction, consider further advancement by 5-10 cm for more optimal position Electronically Signed   By: Donavan Foil M.D.   On: 12/13/2020 22:25   DG C-Arm 1-60 Min-No Report  Result Date: 12/13/2020 Fluoroscopy was utilized by the requesting physician.  No radiographic interpretation.   ECHOCARDIOGRAM COMPLETE  Result Date: 12/14/2020    ECHOCARDIOGRAM REPORT   Patient Name:   JOHNMARK GEIGER Date of Exam: 12/14/2020 Medical Rec #:  [id number]        Height:       72.0 in Accession #:    [id number]       Weight:       240.0 lb Date of Birth:  [birth year]         BSA:          2.302 m Patient Age:    43 years         BP:  95/62 mmHg Patient Gender: M                HR:           71 bpm. Exam Location:  Inpatient Procedure: 2D Echo, Cardiac Doppler and Color Doppler Indications:    Cardiomegaly  History:        Patient has no prior history of Echocardiogram examinations.                 Risk Factors:Hypertension and Current Smoker.  Sonographer:    Wenda Low Referring Phys: [place]  1. Left ventricular ejection fraction, by estimation, is 65 to 70%. The left ventricle has normal function. The left ventricle has no regional wall motion abnormalities. There is moderate concentric left ventricular hypertrophy. Left ventricular diastolic parameters were normal.  2. Right ventricular systolic function is normal. The right ventricular size is normal.  3. The mitral valve is grossly normal. No evidence of mitral valve regurgitation.  4. The aortic valve is normal in structure. There is mild calcification of the aortic valve. Aortic valve regurgitation is mild to moderate. FINDINGS  Left Ventricle: Left ventricular ejection fraction, by estimation, is 65 to 70%. The left ventricle has normal function. The left ventricle has no regional wall motion abnormalities. The left ventricular internal cavity size was small. There is moderate  concentric left ventricular  hypertrophy. Left ventricular diastolic parameters were normal. Right Ventricle: The right ventricular size is normal. Right vetricular wall thickness was not well visualized. Right ventricular systolic function is normal. Left Atrium: Left atrial size was normal in size. Right Atrium: Right atrial size was normal in size. Pericardium: There is no evidence of pericardial effusion. Mitral Valve: The mitral valve is grossly normal. No evidence of mitral valve regurgitation. MV peak gradient, 3.3 mmHg. The mean mitral valve gradient is 1.0 mmHg. Tricuspid Valve: The tricuspid valve is normal in structure. Tricuspid valve regurgitation is trivial. Aortic Valve: The aortic valve is normal in structure. There is mild calcification of the aortic valve. Aortic valve regurgitation is mild to moderate. Aortic valve mean gradient measures 5.0 mmHg. Aortic valve peak gradient measures 9.1 mmHg. Aortic valve area, by VTI measures 3.15 cm. Pulmonic Valve: The pulmonic valve was normal in structure. Pulmonic valve regurgitation is not visualized. Aorta: The aortic root and ascending aorta are structurally normal, with no evidence of dilitation. IAS/Shunts: The atrial septum is grossly normal.  LEFT VENTRICLE PLAX 2D LVIDd:         4.50 cm     Diastology LVIDs:         2.60 cm     LV e' medial:    6.57 cm/s LV PW:         1.40 cm     LV E/e' medial:  5.2 LV IVS:        1.30 cm     LV e' lateral:   8.76 cm/s LVOT diam:     2.20 cm     LV E/e' lateral: 3.9 LV SV:         73 LV SV Index:   32 LVOT Area:     3.80 cm  LV Volumes (MOD) LV vol d, MOD A2C: 54.6 ml LV vol d, MOD A4C: 73.6 ml LV vol s, MOD A2C: 27.3 ml LV vol s, MOD A4C: 33.3 ml LV SV MOD A2C:     27.3 ml LV SV MOD A4C:     73.6 ml LV SV  MOD BP:      32.8 ml RIGHT VENTRICLE RV Basal diam:  5.00 cm RV Mid diam:    3.60 cm LEFT ATRIUM             Index       RIGHT ATRIUM           Index LA diam:        3.70 cm 1.61 cm/m  RA Area:     15.70 cm LA Vol (A2C):   59.0 ml 25.63  ml/m RA Volume:   33.70 ml  14.64 ml/m LA Vol (A4C):   56.7 ml 24.63 ml/m LA Biplane Vol: 57.9 ml 25.15 ml/m  AORTIC VALVE AV Area (Vmax):    2.95 cm AV Area (Vmean):   2.59 cm AV Area (VTI):     3.15 cm AV Vmax:           151.00 cm/s AV Vmean:          107.000 cm/s AV VTI:            0.232 m AV Peak Grad:      9.1 mmHg AV Mean Grad:      5.0 mmHg LVOT Vmax:         117.00 cm/s LVOT Vmean:        72.900 cm/s LVOT VTI:          0.192 m LVOT/AV VTI ratio: 0.83  AORTA Ao Root diam: 3.10 cm MITRAL VALVE MV Area (PHT): 2.57 cm    SHUNTS MV Area VTI:   4.08 cm    Systemic VTI:  0.19 m MV Peak grad:  3.3 mmHg    Systemic Diam: 2.20 cm MV Mean grad:  1.0 mmHg MV Vmax:       0.91 m/s MV Vmean:      38.7 cm/s MV Decel Time: 296 msec MV E velocity: 34.15 cm/s MV A velocity: 52.90 cm/s MV E/A ratio:  0.65 Mertie Moores MD Electronically signed by Mertie Moores MD Signature Date/Time: 12/14/2020/4:25:28 PM    Final    CT OArm Limited Study-No Report  Result Date: 12/14/2020 Fluoroscopy was utilized by the requesting physician.  No radiographic interpretation.   VAS [place] LOWER EXTREMITY VENOUS (DVT)  Result Date: 12/14/2020  Lower Venous DVT Study Patient Name:  JARIAH JARMON  Date of Exam:   12/14/2020 Medical Rec #: [id number]         Accession #:    [id number] Date of Birth: 1960-05-31          Patient Gender: M Patient Age:   46 years Exam Location:  [facility] Procedure:      VAS [place] LOWER EXTREMITY VENOUS (DVT) Referring Phys: [facility] --------------------------------------------------------------------------------  Indications: Swelling, and recent extensive spinal surgery.  Comparison Study: No prior studies. Performing Technologist: Maudry Mayhew MHA, RDMS, RVT, RDCS  Examination Guidelines: A complete evaluation includes B-mode imaging, spectral Doppler, color Doppler, and power Doppler as needed of all accessible portions of each vessel. Bilateral testing is considered an integral part of a  complete examination. Limited examinations for reoccurring indications may be performed as noted. The reflux portion of the exam is performed with the patient in reverse Trendelenburg.  +---------+---------------+---------+-----------+----------+--------------+ RIGHT    CompressibilityPhasicitySpontaneityPropertiesThrombus Aging +---------+---------------+---------+-----------+----------+--------------+ CFV      Full           Yes      Yes                                 +---------+---------------+---------+-----------+----------+--------------+  SFJ      Full                                                        +---------+---------------+---------+-----------+----------+--------------+ FV Prox  Full                                                        +---------+---------------+---------+-----------+----------+--------------+ FV Mid   Full                                                        +---------+---------------+---------+-----------+----------+--------------+ FV DistalFull                                                        +---------+---------------+---------+-----------+----------+--------------+ PFV      None                    No                   Acute          +---------+---------------+---------+-----------+----------+--------------+ POP      Full           Yes      Yes                                 +---------+---------------+---------+-----------+----------+--------------+ PTV      Full                                                        +---------+---------------+---------+-----------+----------+--------------+ PERO     Full                                                        +---------+---------------+---------+-----------+----------+--------------+ Gastroc  None                    No                   Acute          +---------+---------------+---------+-----------+----------+--------------+ GSV      None                                          Acute          +---------+---------------+---------+-----------+----------+--------------+   +---------+---------------+---------+-----------+----------+--------------+ LEFT     CompressibilityPhasicitySpontaneityPropertiesThrombus  Aging +---------+---------------+---------+-----------+----------+--------------+ CFV      Full           Yes      Yes                                 +---------+---------------+---------+-----------+----------+--------------+ SFJ      Full                                                        +---------+---------------+---------+-----------+----------+--------------+ FV Prox  Full                                                        +---------+---------------+---------+-----------+----------+--------------+ FV Mid   Full                                                        +---------+---------------+---------+-----------+----------+--------------+ FV DistalFull                                                        +---------+---------------+---------+-----------+----------+--------------+ PFV      Full                                                        +---------+---------------+---------+-----------+----------+--------------+ POP      Full           Yes      Yes                                 +---------+---------------+---------+-----------+----------+--------------+ PTV      Full                                                        +---------+---------------+---------+-----------+----------+--------------+ PERO     Full                                                        +---------+---------------+---------+-----------+----------+--------------+     Summary: RIGHT: - Findings consistent with acute deep vein thrombosis involving the right proximal profunda vein, and right gastrocnemius veins. - Findings consistent with acute superficial vein thrombosis  involving the right great saphenous vein. - No cystic structure found in the popliteal fossa.  LEFT: - There is no  evidence of deep vein thrombosis in the lower extremity.  - No cystic structure found in the popliteal fossa.  *See table(s) above for measurements and observations. Electronically signed by Jamelle Haring on 12/14/2020 at 5:31:08 PM.    Final     Review of Systems  Unable to perform ROS: Intubated  Blood pressure 109/62, pulse 94, temperature 97.9 F (36.6 C), temperature source Oral, resp. rate 19, height 6' (1.829 m), weight 104.9 kg, SpO2 99 %. Physical Exam Vitals and nursing note reviewed.  Constitutional:      Appearance: He is obese.     Comments: Intubated, sedated  HENT:     Head: Normocephalic and atraumatic.     Nose: Nose normal.     Mouth/Throat:     Comments: Intubated Eyes:     General: No scleral icterus.    Conjunctiva/sclera: Conjunctivae normal.  Cardiovascular:     Rate and Rhythm: Normal rate and regular rhythm.     Heart sounds: Normal heart sounds. No murmur heard. Pulmonary:     Effort: Pulmonary effort is normal.     Breath sounds: Normal breath sounds. No wheezing or rales.  Abdominal:     Palpations: Abdomen is soft.     Tenderness: There is no abdominal tenderness. There is no guarding.     Comments: No audible bowel sounds  Musculoskeletal:     Cervical back: Neck supple.     Right lower leg: Edema present.     Left lower leg: Edema present.     Comments: Trace-1+ lower extremity edema  Skin:    General: Skin is warm and dry.    Assessment/Plan: 1.  Acute kidney injury: Appears to be from a combination of ischemic ATN (acute blood loss anemia/relative hypotension) and rhabdomyolysis.  Unfortunately, he is oliguric and this portends a poor prognosis-I will reattempt trial of diuretics for the next 24 hours and if unsuccessful, will undertake renal replacement therapy for management of volume/metabolic abnormalities.  He does not have any  acute indications for dialysis at this time. Avoid nephrotoxic medications including NSAIDs and iodinated intravenous contrast exposure unless the latter is absolutely indicated.  Preferred narcotic agents for pain control are hydromorphone, fentanyl, and methadone. Morphine should not be used. Avoid Baclofen and avoid oral sodium phosphate and magnesium citrate based laxatives / bowel preps. Continue strict Input and Output monitoring. Will monitor the patient closely with you and intervene or adjust therapy as indicated by changes in clinical status/labs. 2.  Hyponatremia: Secondary to free water handling defect in the setting of acute kidney injury, monitor with attempted diuresis at this time. 3.  Acute postoperative hypoxic/hypercapnic respiratory failure: Intubated and on ventilator support per CCM. 4.  Lumbar spine stenosis/scoliosis status post decompression and fusion surgery: With prolonged surgical duration leading to rhabdomyolysis and some intraoperative hemodynamic shifts from blood loss. 5.  Shock: Secondary to distributive/hypovolemic mechanism.  Status post intravenous fluids and now on pressors. 6.  Acute blood loss anemia: Secondary to surgical associated losses, status post PRBC transfusion with slight downtrend of hemoglobin and hematocrit noted on labs this morning.  Selene Peltzer K. 12/15/2020, 11:42 AM

## 2020-12-15 NOTE — H&P (Signed)
Chief Complaint: Patient was seen in consultation today for IVC filter placement at the request of Dr. Tacy Learn, Chauncey Cruel.   Referring Physician(s): Dr. Tacy Learn, S.   Supervising Physician: Ruthann Cancer  Patient Status: [facility] - In-pt  History of Present Illness: Keith Dixon is a 60 y.o. male with PMH listed below and recent back surgery on 12/13/2020 currently hospitalized due to development AKI and being remained intubated after surgery. Patient was found to have a acute right lower leg DVT confirmed by Doppler on 12/14/2020.  Decision was made to place patient on heparin infusion, however, patient was not able to tolerate anticoagulation, there was more bleeding found in the drain that was placed during the surgery.   IR was requested for image guided IVC filter placement. Case was reviewed and approved by Dr. Serafina Royals.  Patient evaluated at the bedside, intubated and sedated.  Respiratory at the bedside. ROS not performed.  Past Medical History:  Diagnosis Date   Anxiety    Arthritis    Bipolar 1 disorder, depressed ([place])    Bipolar disorder ([place])    Depression    History of posttraumatic stress disorder (PTSD)    Hypertension    PTSD (post-traumatic stress disorder)    Seizures ([place])    Sleep apnea    Smoker    Tobacco use disorder     Past Surgical History:  Procedure Laterality Date   ANTERIOR LATERAL LUMBAR FUSION 4 LEVELS N/A 12/12/2020   Procedure: Anterior Lateral Interbody Fusion Lumbar One-Two, Lumbar Two-Three, Lumbar Three-Four, Lumbar Four-Five;  Surgeon: Franchot Gallo, MD;  Location: [place];  Service: Urology;  Laterality: N/A;  Anterior Lateral Interbody Fusion Lumbar One-Two, Lumbar Two-Three, Lumbar Three-Four, Lumbar Four-Five   CYSTOSCOPY  12/12/2020   Procedure: CYSTOSCOPY, URETHRAL DILATION, DIFFICULT FOLEY INSERTION;  Surgeon: Franchot Gallo, MD;  Location: [place];  Service: Urology;;    Allergies: Patient has no known allergies.  Medications: Prior  to Admission medications   Medication Sig Start Date End Date Taking? Authorizing Provider  acetaminophen (TYLENOL) 500 MG tablet Take 2 tablets (1,000 mg total) by mouth every 6 (six) hours as needed for mild pain. 04/25/19  Yes Meuth, Brooke A, PA-C  amLODipine (NORVASC) 10 MG tablet Take 10 mg by mouth in the morning.   Yes [provider]  baclofen (LIORESAL) 10 MG tablet Take 10 mg by mouth 2 (two) times daily.   Yes [provider]  Cholecalciferol (VITAMIN D3) 50 MCG (2000 UT) TABS Take 2,000 Units by mouth in the morning and at bedtime.   Yes [provider]  gabapentin (NEURONTIN) 300 MG capsule Take 900 mg by mouth 2 (two) times daily.   Yes [provider]  hydrOXYzine (VISTARIL) 50 MG capsule Take 50 mg by mouth in the morning and at bedtime.   Yes [provider]  lamoTRIgine (LAMICTAL) 100 MG tablet Take 1 tablet (100 mg total) by mouth 2 (two) times daily. 09/14/20  Yes Oswald Hillock, MD  levETIRAcetam (KEPPRA) 750 MG tablet Take 1,500 mg by mouth 2 (two) times daily.   Yes [provider]  lisinopril-hydrochlorothiazide (ZESTORETIC) 20-12.5 MG tablet Take 1 tablet by mouth in the morning.   Yes [provider]  meloxicam (MOBIC) 15 MG tablet Take 15 mg by mouth daily after breakfast.   Yes [provider]  QUEtiapine (SEROQUEL) 200 MG tablet Take 200 mg by mouth at bedtime.   Yes [provider]  sertraline (ZOLOFT) 100 MG tablet Take  100 mg by mouth daily after breakfast.   Yes [provider]     Family History  Problem Relation Age of Onset   Seizures Mother    Breast cancer Mother 51   Hypertension Brother     Social History   Socioeconomic History   Marital status: Significant Other    Spouse name: Not on file   Number of children: 1   Years of education: Not on file   Highest education level: Not on file  Occupational History    Employer: [employer]  Tobacco Use    Smoking status: Every Day    Packs/day: 0.50    Years: 20.00    Pack years: 10.00    Types: Cigarettes   Smokeless tobacco: Never  Vaping Use   Vaping Use: Never used  Substance and Sexual Activity   Alcohol use: Yes    Alcohol/week: 2.0 standard drinks    Types: 2 Shots of liquor per week   Drug use: Yes    Types: Marijuana    Comment: pt stated he quit 2 weeks ago 08/10/12, pt reports doing marijuana on 11/13/12   Sexual activity: Yes  Other Topics Concern   Not on file  Social History Narrative   ** Merged History Encounter **       Social Determinants of Health   Financial Resource Strain: Not on file  Food Insecurity: Not on file  Transportation Needs: Not on file  Physical Activity: Not on file  Stress: Not on file  Social Connections: Not on file     Review of Systems: A 12 point ROS discussed and pertinent positives are indicated in the HPI above.  All other systems are negative.   Vital Signs: BP 140/71   Pulse 96   Temp 99.3 F (37.4 C) (Oral)   Resp 19   Ht 6' (1.829 m)   Wt 231 lb 4.2 oz (104.9 kg)   SpO2 100%   BMI 31.36 kg/m   Physical Exam Vitals reviewed.  Constitutional:      General: He is not in acute distress.    Appearance: He is ill-appearing.  HENT:     Head: Normocephalic and atraumatic.     Mouth/Throat:     Comments: Has ETT and OGT Cardiovascular:     Rate and Rhythm: Normal rate and regular rhythm.     Pulses: Normal pulses.     Heart sounds: Normal heart sounds.  Pulmonary:     Effort: Pulmonary effort is normal.     Breath sounds: Normal breath sounds.  Abdominal:     General: Abdomen is flat. Bowel sounds are normal.     Palpations: Abdomen is soft.  Skin:    General: Skin is warm and dry.     Coloration: Skin is not jaundiced or pale.  Neurological:     Comments: Intubated and sedated    MD Evaluation Airway: WNL Heart: WNL Abdomen: WNL Chest/ Lungs: WNL ASA  Classification: 3 Mallampati/Airway Score:   (Intubated)  Imaging: DG Lumbar Spine 2-3 Views  Result Date: 12/12/2020 CLINICAL DATA:  Lumbar fusion L1-2 L2-3, L3-4, and L4-5. EXAM: LUMBAR SPINE - 2-3 VIEW; DG C-ARM 1-60 MIN COMPARISON:  None. FINDINGS: Interbody spacers present at L1-2, L2-3, L3-4, and L4-5. No unexpected radiopaque foreign body is present. IMPRESSION: Status post lumbar fusion without radiographic evidence for complication. Electronically Signed   By: San Morelle M.D.   On: 12/12/2020 15:22   DG CHEST PORT 1 VIEW  Result Date: 12/14/2020 CLINICAL DATA:  Central line placement EXAM: PORTABLE CHEST 1 VIEW COMPARISON:  12/13/2020 FINDINGS: Interval placement of right IJ central venous catheter with distal tip terminating at the level of the distal SVC. ET tube terminates approximately 5.7 cm above the carina. Enteric tube terminates within the stomach. Stable heart size. Low lung volumes. No pneumothorax. Midline surgical staples. IMPRESSION: Interval placement of right IJ central venous catheter. No pneumothorax. Electronically Signed   By: Davina Poke D.O.   On: 12/14/2020 15:43   DG CHEST PORT 1 VIEW  Result Date: 12/13/2020 CLINICAL DATA:  Intubated EXAM: PORTABLE CHEST 1 VIEW COMPARISON:  09/08/2020 FINDINGS: Endotracheal tube tip is about 3.5 cm superior to the carina. Esophageal tube tip below the diaphragm. Low lung volumes. No focal opacity, pleural effusion or pneumothorax IMPRESSION: 1. Endotracheal tube tip about 3.5 cm superior to carina 2. Clear lung fields Electronically Signed   By: Donavan Foil M.D.   On: 12/13/2020 22:26   DG Abd Portable 1V  Result Date: 12/14/2020 CLINICAL DATA:  Status post NG placement. EXAM: PORTABLE ABDOMEN - 1 VIEW COMPARISON:  Earlier radiograph dated 12/13/2020. FINDINGS: Partially visualized enteric tube with tip likely in the proximal body of the stomach. No bowel dilatation. There is extensive postsurgical changes of the spine with fusion hardware. The soft tissues are  grossly unremarkable. Midline vertical cutaneous staples noted. IMPRESSION: Enteric tube with tip likely in the proximal body of the stomach. Electronically Signed   By: Anner Crete M.D.   On: 12/14/2020 01:03   DG Abd Portable 1V  Result Date: 12/13/2020 CLINICAL DATA:  OG tube placement EXAM: PORTABLE ABDOMEN - 1 VIEW COMPARISON:  12/13/2020 FINDINGS: Esophageal tube tip overlies proximal stomach, side-port in the region of GE junction. Nonobstructed upper gas pattern. IMPRESSION: Esophageal tube side-port in the region of GE junction, consider further advancement by 5-10 cm for more optimal position Electronically Signed   By: Donavan Foil M.D.   On: 12/13/2020 22:25   DG C-Arm 1-60 Min  Result Date: 12/12/2020 CLINICAL DATA:  Lumbar fusion L1-2 L2-3, L3-4, and L4-5. EXAM: LUMBAR SPINE - 2-3 VIEW; DG C-ARM 1-60 MIN COMPARISON:  None. FINDINGS: Interbody spacers present at L1-2, L2-3, L3-4, and L4-5. No unexpected radiopaque foreign body is present. IMPRESSION: Status post lumbar fusion without radiographic evidence for complication. Electronically Signed   By: San Morelle M.D.   On: 12/12/2020 15:22   DG C-Arm 1-60 Min-No Report  Result Date: 12/13/2020 Fluoroscopy was utilized by the requesting physician.  No radiographic interpretation.   ECHOCARDIOGRAM COMPLETE  Result Date: 12/14/2020    ECHOCARDIOGRAM REPORT   Patient Name:   Keith Dixon Date of Exam: 12/14/2020 Medical Rec #:  [id number]        Height:       72.0 in Accession #:    [id number]       Weight:       240.0 lb Date of Birth:  1960/06/09         BSA:          2.302 m Patient Age:    11 years         BP:           95/62 mmHg Patient Gender: M                HR:           71 bpm. Exam Location:  Inpatient Procedure: 2D Echo, Cardiac Doppler and  Color Doppler Indications:    Cardiomegaly  History:        Patient has no prior history of Echocardiogram examinations.                 Risk Factors:Hypertension and Current  Smoker.  Sonographer:    Wenda Low Referring Phys: [place]  1. Left ventricular ejection fraction, by estimation, is 65 to 70%. The left ventricle has normal function. The left ventricle has no regional wall motion abnormalities. There is moderate concentric left ventricular hypertrophy. Left ventricular diastolic parameters were normal.  2. Right ventricular systolic function is normal. The right ventricular size is normal.  3. The mitral valve is grossly normal. No evidence of mitral valve regurgitation.  4. The aortic valve is normal in structure. There is mild calcification of the aortic valve. Aortic valve regurgitation is mild to moderate. FINDINGS  Left Ventricle: Left ventricular ejection fraction, by estimation, is 65 to 70%. The left ventricle has normal function. The left ventricle has no regional wall motion abnormalities. The left ventricular internal cavity size was small. There is moderate  concentric left ventricular hypertrophy. Left ventricular diastolic parameters were normal. Right Ventricle: The right ventricular size is normal. Right vetricular wall thickness was not well visualized. Right ventricular systolic function is normal. Left Atrium: Left atrial size was normal in size. Right Atrium: Right atrial size was normal in size. Pericardium: There is no evidence of pericardial effusion. Mitral Valve: The mitral valve is grossly normal. No evidence of mitral valve regurgitation. MV peak gradient, 3.3 mmHg. The mean mitral valve gradient is 1.0 mmHg. Tricuspid Valve: The tricuspid valve is normal in structure. Tricuspid valve regurgitation is trivial. Aortic Valve: The aortic valve is normal in structure. There is mild calcification of the aortic valve. Aortic valve regurgitation is mild to moderate. Aortic valve mean gradient measures 5.0 mmHg. Aortic valve peak gradient measures 9.1 mmHg. Aortic valve area, by VTI measures 3.15 cm. Pulmonic Valve: The  pulmonic valve was normal in structure. Pulmonic valve regurgitation is not visualized. Aorta: The aortic root and ascending aorta are structurally normal, with no evidence of dilitation. IAS/Shunts: The atrial septum is grossly normal.  LEFT VENTRICLE PLAX 2D LVIDd:         4.50 cm     Diastology LVIDs:         2.60 cm     LV e' medial:    6.57 cm/s LV PW:         1.40 cm     LV E/e' medial:  5.2 LV IVS:        1.30 cm     LV e' lateral:   8.76 cm/s LVOT diam:     2.20 cm     LV E/e' lateral: 3.9 LV SV:         73 LV SV Index:   32 LVOT Area:     3.80 cm  LV Volumes (MOD) LV vol d, MOD A2C: 54.6 ml LV vol d, MOD A4C: 73.6 ml LV vol s, MOD A2C: 27.3 ml LV vol s, MOD A4C: 33.3 ml LV SV MOD A2C:     27.3 ml LV SV MOD A4C:     73.6 ml LV SV MOD BP:      32.8 ml RIGHT VENTRICLE RV Basal diam:  5.00 cm RV Mid diam:    3.60 cm LEFT ATRIUM             Index  RIGHT ATRIUM           Index LA diam:        3.70 cm 1.61 cm/m  RA Area:     15.70 cm LA Vol (A2C):   59.0 ml 25.63 ml/m RA Volume:   33.70 ml  14.64 ml/m LA Vol (A4C):   56.7 ml 24.63 ml/m LA Biplane Vol: 57.9 ml 25.15 ml/m  AORTIC VALVE AV Area (Vmax):    2.95 cm AV Area (Vmean):   2.59 cm AV Area (VTI):     3.15 cm AV Vmax:           151.00 cm/s AV Vmean:          107.000 cm/s AV VTI:            0.232 m AV Peak Grad:      9.1 mmHg AV Mean Grad:      5.0 mmHg LVOT Vmax:         117.00 cm/s LVOT Vmean:        72.900 cm/s LVOT VTI:          0.192 m LVOT/AV VTI ratio: 0.83  AORTA Ao Root diam: 3.10 cm MITRAL VALVE MV Area (PHT): 2.57 cm    SHUNTS MV Area VTI:   4.08 cm    Systemic VTI:  0.19 m MV Peak grad:  3.3 mmHg    Systemic Diam: 2.20 cm MV Mean grad:  1.0 mmHg MV Vmax:       0.91 m/s MV Vmean:      38.7 cm/s MV Decel Time: 296 msec MV E velocity: 34.15 cm/s MV A velocity: 52.90 cm/s MV E/A ratio:  0.65 Mertie Moores MD Electronically signed by Mertie Moores MD Signature Date/Time: 12/14/2020/4:25:28 PM    Final    CT OArm Limited Study-No  Report  Result Date: 12/14/2020 Fluoroscopy was utilized by the requesting physician.  No radiographic interpretation.   VAS [place] LOWER EXTREMITY VENOUS (DVT)  Result Date: 12/14/2020  Lower Venous DVT Study Patient Name:  Keith Dixon  Date of Exam:   12/14/2020 Medical Rec #: [id number]         Accession #:    [id number] Date of Birth: March 08, 1961          Patient Gender: M Patient Age:   55 years Exam Location:  [facility] Procedure:      VAS [place] LOWER EXTREMITY VENOUS (DVT) Referring Phys: [facility] HARRIS --------------------------------------------------------------------------------  Indications: Swelling, and recent extensive spinal surgery.  Comparison Study: No prior studies. Performing Technologist: Maudry Mayhew MHA, RDMS, RVT, RDCS  Examination Guidelines: A complete evaluation includes B-mode imaging, spectral Doppler, color Doppler, and power Doppler as needed of all accessible portions of each vessel. Bilateral testing is considered an integral part of a complete examination. Limited examinations for reoccurring indications may be performed as noted. The reflux portion of the exam is performed with the patient in reverse Trendelenburg.  +---------+---------------+---------+-----------+----------+--------------+ RIGHT    CompressibilityPhasicitySpontaneityPropertiesThrombus Aging +---------+---------------+---------+-----------+----------+--------------+ CFV      Full           Yes      Yes                                 +---------+---------------+---------+-----------+----------+--------------+ SFJ      Full                                                        +---------+---------------+---------+-----------+----------+--------------+  FV Prox  Full                                                        +---------+---------------+---------+-----------+----------+--------------+ FV Mid   Full                                                         +---------+---------------+---------+-----------+----------+--------------+ FV DistalFull                                                        +---------+---------------+---------+-----------+----------+--------------+ PFV      None                    No                   Acute          +---------+---------------+---------+-----------+----------+--------------+ POP      Full           Yes      Yes                                 +---------+---------------+---------+-----------+----------+--------------+ PTV      Full                                                        +---------+---------------+---------+-----------+----------+--------------+ PERO     Full                                                        +---------+---------------+---------+-----------+----------+--------------+ Gastroc  None                    No                   Acute          +---------+---------------+---------+-----------+----------+--------------+ GSV      None                                         Acute          +---------+---------------+---------+-----------+----------+--------------+   +---------+---------------+---------+-----------+----------+--------------+ LEFT     CompressibilityPhasicitySpontaneityPropertiesThrombus Aging +---------+---------------+---------+-----------+----------+--------------+ CFV      Full           Yes      Yes                                 +---------+---------------+---------+-----------+----------+--------------+ SFJ  Full                                                        +---------+---------------+---------+-----------+----------+--------------+ FV Prox  Full                                                        +---------+---------------+---------+-----------+----------+--------------+ FV Mid   Full                                                         +---------+---------------+---------+-----------+----------+--------------+ FV DistalFull                                                        +---------+---------------+---------+-----------+----------+--------------+ PFV      Full                                                        +---------+---------------+---------+-----------+----------+--------------+ POP      Full           Yes      Yes                                 +---------+---------------+---------+-----------+----------+--------------+ PTV      Full                                                        +---------+---------------+---------+-----------+----------+--------------+ PERO     Full                                                        +---------+---------------+---------+-----------+----------+--------------+     Summary: RIGHT: - Findings consistent with acute deep vein thrombosis involving the right proximal profunda vein, and right gastrocnemius veins. - Findings consistent with acute superficial vein thrombosis involving the right great saphenous vein. - No cystic structure found in the popliteal fossa.  LEFT: - There is no evidence of deep vein thrombosis in the lower extremity.  - No cystic structure found in the popliteal fossa.  *See table(s) above for measurements and observations. Electronically signed by Jamelle Haring on 12/14/2020 at 5:31:08 PM.    Final     Labs:  CBC: Recent Labs    12/13/20 1955 12/13/20 2008 12/14/20 [id number] 12/14/20 [id number] 12/14/20 1745 12/14/20 2154  12/15/20 0459  WBC 12.1*  --  16.3*  --  15.4*  --  14.0*  HGB 11.4*   < > 12.9* 11.2* 10.4* 9.5* 8.9*  HCT 35.0*   < > 37.8* 33.0* 30.5* 28.0* 26.6*  PLT 152  --  134*  --  ACLMP  --  PLATELET CLUMPS NOTED ON SMEAR, UNABLE TO ESTIMATE   < > = values in this interval not displayed.    COAGS: Recent Labs    09/08/20 1136  INR 1.0  APTT 20*    BMP: Recent Labs    12/13/20 0740 12/13/20 1513  12/13/20 2008 12/13/20 2234 12/14/20 0458 12/14/20 0851 12/14/20 1745 12/14/20 2154 12/15/20 0459  NA 134*   < > 135   < > 134* 133* 132* 132* 130*  K 3.5   < > 5.1   < > 4.9 4.6 4.7 4.4 4.4  CL 101  --  103  --  101  --  104  --  101  CO2 22  --   --   --  21*  --  18*  --  19*  GLUCOSE 116*  --  150*  --  141*  --  152*  --  164*  BUN 10  --  17  --  23*  --  32*  --  44*  CALCIUM 9.5  --   --   --  7.7*  --  7.5*  --  7.1*  CREATININE 1.05  --  1.50*  --  2.61*  --  4.05*  --  4.91*  GFRNONAA >60  --   --   --  28*  --  16*  --  13*   < > = values in this interval not displayed.    LIVER FUNCTION TESTS: Recent Labs    09/10/20 0330 09/28/20 0000 12/05/20 1258 12/13/20 0740 12/14/20 1745  BILITOT 0.8 0.3 0.4 1.6* 0.7  AST '25 14 22 '$ 45* 319*  ALT '21 13 20 21 '$ 145*  ALKPHOS 57  --  62 78 63  PROT 5.6* 6.8 6.4* 7.0 4.7*  ALBUMIN 2.9*  --  3.8 4.0 2.5*    TUMOR MARKERS: No results for input(s): AFPTM, CEA, CA199, CHROMGRNA in the last 8760 hours.  Assessment and Plan: 60 y.o. male with acute right lower leg DVT diagnosed on 12/14/2020, failed anticoagulation treatment, in need of IVC filter.  IR was requested for image guided IVC filter placement. The case was reviewed and approved by Dr. Serafina Royals.  The procedure is scheduled for tomorrow morning. Stop tube feeding at midnight  Patient is currently sedated and intubated, the procedure was discussed with his daughter Ms. [employer] and her husband via telephone.   Risks and benefits discussed with the patient's daughter and her husband  including, but not limited to bleeding, infection, contrast induced renal failure, filter fracture or migration which can lead to emergency surgery or even death, strut penetration with damage or irritation to adjacent structures and caval thrombosis.  All of the daughter and her husband's questions were answered, patient is agreeable to proceed. Consent signed and in  chart.   Thank you for this interesting consult.  I greatly enjoyed meeting Keith Dixon and look forward to participating in their care.  A copy of this report was sent to the requesting provider on this date.  Electronically Signed: Tera Mater, PA-C 12/15/2020, 3:42 PM   I spent a total of 40 Minutes    in  face to face in clinical consultation, greater than 50% of which was counseling/coordinating care for IVC filter placement

## 2020-12-16 ENCOUNTER — Inpatient Hospital Stay (HOSPITAL_COMMUNITY): Payer: No Typology Code available for payment source

## 2020-12-16 DIAGNOSIS — G9341 Metabolic encephalopathy: Secondary | ICD-10-CM | POA: Diagnosis not present

## 2020-12-16 DIAGNOSIS — I63413 Cerebral infarction due to embolism of bilateral middle cerebral arteries: Secondary | ICD-10-CM

## 2020-12-16 DIAGNOSIS — M4126 Other idiopathic scoliosis, lumbar region: Secondary | ICD-10-CM | POA: Diagnosis not present

## 2020-12-16 DIAGNOSIS — J9601 Acute respiratory failure with hypoxia: Secondary | ICD-10-CM | POA: Diagnosis not present

## 2020-12-16 DIAGNOSIS — N179 Acute kidney failure, unspecified: Secondary | ICD-10-CM | POA: Diagnosis not present

## 2020-12-16 HISTORY — PX: IR IVC FILTER PLMT / S&I /IMG GUID/MOD SED: IMG701

## 2020-12-16 LAB — POCT I-STAT 7, (LYTES, BLD GAS, ICA,H+H)
Acid-base deficit: 5 mmol/L — ABNORMAL HIGH (ref 0.0–2.0)
Bicarbonate: 23.4 mmol/L (ref 20.0–28.0)
Calcium, Ion: 0.8 mmol/L — CL (ref 1.15–1.40)
HCT: 23 % — ABNORMAL LOW (ref 39.0–52.0)
Hemoglobin: 7.8 g/dL — ABNORMAL LOW (ref 13.0–17.0)
O2 Saturation: 99 %
Patient temperature: 98.8
Potassium: 5.4 mmol/L — ABNORMAL HIGH (ref 3.5–5.1)
Sodium: 129 mmol/L — ABNORMAL LOW (ref 135–145)
TCO2: 25 mmol/L (ref 22–32)
pCO2 arterial: 63.5 mmHg — ABNORMAL HIGH (ref 32.0–48.0)
pH, Arterial: 7.175 — CL (ref 7.350–7.450)
pO2, Arterial: 148 mmHg — ABNORMAL HIGH (ref 83.0–108.0)

## 2020-12-16 LAB — RENAL FUNCTION PANEL
Albumin: 2 g/dL — ABNORMAL LOW (ref 3.5–5.0)
Albumin: 1.9 g/dL — ABNORMAL LOW (ref 3.5–5.0)
Anion gap: 11 (ref 5–15)
Anion gap: 11 (ref 5–15)
BUN: 62 mg/dL — ABNORMAL HIGH (ref 6–20)
BUN: 66 mg/dL — ABNORMAL HIGH (ref 6–20)
CO2: 19 mmol/L — ABNORMAL LOW (ref 22–32)
CO2: 18 mmol/L — ABNORMAL LOW (ref 22–32)
Calcium: 7.2 mg/dL — ABNORMAL LOW (ref 8.9–10.3)
Calcium: 7 mg/dL — ABNORMAL LOW (ref 8.9–10.3)
Chloride: 98 mmol/L (ref 98–111)
Chloride: 98 mmol/L (ref 98–111)
Creatinine, Ser: 6.63 mg/dL — ABNORMAL HIGH (ref 0.61–1.24)
Creatinine, Ser: 6.74 mg/dL — ABNORMAL HIGH (ref 0.61–1.24)
GFR, Estimated: 9 mL/min — ABNORMAL LOW (ref >60)
GFR, Estimated: 9 mL/min — ABNORMAL LOW (ref >60)
Glucose, Bld: 124 mg/dL — ABNORMAL HIGH (ref 70–99)
Glucose, Bld: 114 mg/dL — ABNORMAL HIGH (ref 70–99)
Phosphorus: 5.4 mg/dL — ABNORMAL HIGH (ref 2.5–4.6)
Phosphorus: 6.4 mg/dL — ABNORMAL HIGH (ref 2.5–4.6)
Potassium: 4.6 mmol/L (ref 3.5–5.1)
Potassium: 4.8 mmol/L (ref 3.5–5.1)
Sodium: 128 mmol/L — ABNORMAL LOW (ref 135–145)
Sodium: 127 mmol/L — ABNORMAL LOW (ref 135–145)

## 2020-12-16 LAB — LIPID PANEL
Cholesterol: 89 mg/dL (ref 0–200)
HDL: 12 mg/dL — ABNORMAL LOW (ref >40)
LDL Cholesterol: 30 mg/dL (ref 0–99)
Total CHOL/HDL Ratio: 7.4 RATIO
Triglycerides: 235 mg/dL — ABNORMAL HIGH (ref <150)
VLDL: 47 mg/dL — ABNORMAL HIGH (ref 0–40)

## 2020-12-16 LAB — PREPARE RBC (CROSSMATCH)

## 2020-12-16 LAB — GLUCOSE, CAPILLARY
Glucose-Capillary: 111 mg/dL — ABNORMAL HIGH (ref 70–99)
Glucose-Capillary: 122 mg/dL — ABNORMAL HIGH (ref 70–99)
Glucose-Capillary: 124 mg/dL — ABNORMAL HIGH (ref 70–99)
Glucose-Capillary: 104 mg/dL — ABNORMAL HIGH (ref 70–99)

## 2020-12-16 LAB — DIC (DISSEMINATED INTRAVASCULAR COAGULATION)PANEL
D-Dimer, Quant: 8.87 ug/mL-FEU — ABNORMAL HIGH (ref 0.00–0.50)
Fibrinogen: 744 mg/dL — ABNORMAL HIGH (ref 210–475)
INR: 1.3 — ABNORMAL HIGH (ref 0.8–1.2)
Platelets: 82 10*3/uL — ABNORMAL LOW (ref 150–400)
Prothrombin Time: 15.7 seconds — ABNORMAL HIGH (ref 11.4–15.2)
Smear Review: NONE SEEN
aPTT: 31 seconds (ref 24–36)

## 2020-12-16 LAB — CBC
HCT: 22.4 % — ABNORMAL LOW (ref 39.0–52.0)
Hemoglobin: 7.5 g/dL — ABNORMAL LOW (ref 13.0–17.0)
MCH: 28.2 pg (ref 26.0–34.0)
MCHC: 33.5 g/dL (ref 30.0–36.0)
MCV: 84.2 fL (ref 80.0–100.0)
Platelets: 72 10*3/uL — ABNORMAL LOW (ref 150–400)
RBC: 2.66 MIL/uL — ABNORMAL LOW (ref 4.22–5.81)
RDW: 16.1 % — ABNORMAL HIGH (ref 11.5–15.5)
WBC: 9.1 10*3/uL (ref 4.0–10.5)
nRBC: 0 % (ref 0.0–0.2)

## 2020-12-16 LAB — POCT ACTIVATED CLOTTING TIME
Activated Clotting Time: 155 seconds
Activated Clotting Time: 161 seconds

## 2020-12-16 LAB — AMMONIA: Ammonia: 26 umol/L (ref 9–35)

## 2020-12-16 LAB — MAGNESIUM: Magnesium: 1.9 mg/dL (ref 1.7–2.4)

## 2020-12-16 LAB — VITAMIN B12: Vitamin B-12: 205 pg/mL (ref 180–914)

## 2020-12-16 LAB — TSH: TSH: 0.442 u[IU]/mL (ref 0.350–4.500)

## 2020-12-16 LAB — FOLATE: Folate: 4.5 ng/mL — ABNORMAL LOW (ref >5.9)

## 2020-12-16 MED ORDER — SODIUM CHLORIDE 0.9 % IV SOLN
500.0000 [IU]/h | INTRAVENOUS | Status: DC
  Administered 2020-12-16 – 2020-12-18 (×3): 600 [IU]/h via INTRAVENOUS_CENTRAL
  Administered 2020-12-18 – 2020-12-20 (×4): 750 [IU]/h via INTRAVENOUS_CENTRAL
  Filled 2020-12-16 (×9): qty 2

## 2020-12-16 MED ORDER — SODIUM CHLORIDE 0.9% IV SOLUTION: Freq: Once | INTRAVENOUS | Status: AC

## 2020-12-16 MED ORDER — MIDAZOLAM HCL 2 MG/2ML IJ SOLN
2.0000 mg | Freq: Once | INTRAMUSCULAR | Status: AC
  Administered 2020-12-16: 2 mg via INTRAVENOUS
  Filled 2020-12-16: qty 2

## 2020-12-16 MED ORDER — LIDOCAINE HCL 1 % IJ SOLN
INTRAMUSCULAR | Status: AC
  Filled 2020-12-16: qty 20

## 2020-12-16 MED ORDER — PRISMASOL BGK 4/2.5 32-4-2.5 MEQ/L REPLACEMENT SOLN
Status: DC
  Filled 2020-12-16 (×6): qty 5000

## 2020-12-16 MED ORDER — ARTIFICIAL TEARS OPHTHALMIC OINT
1.0000 "application " | TOPICAL_OINTMENT | Freq: Three times a day (TID) | OPHTHALMIC | Status: DC
  Administered 2020-12-17 – 2020-12-18 (×4): 1 via OPHTHALMIC
  Filled 2020-12-16 (×2): qty 3.5

## 2020-12-16 MED ORDER — IOHEXOL 300 MG/ML  SOLN
100.0000 mL | Freq: Once | INTRAMUSCULAR | Status: AC | PRN
  Administered 2020-12-16: 30 mL via INTRAVENOUS

## 2020-12-16 MED ORDER — THIAMINE HCL 100 MG/ML IJ SOLN
500.0000 mg | Freq: Three times a day (TID) | INTRAVENOUS | Status: AC
  Administered 2020-12-16 – 2020-12-18 (×6): 500 mg via INTRAVENOUS
  Filled 2020-12-16 (×7): qty 5

## 2020-12-16 MED ORDER — PROPOFOL 1000 MG/100ML IV EMUL
INTRAVENOUS | Status: AC
  Filled 2020-12-16: qty 100

## 2020-12-16 MED ORDER — PROPOFOL 1000 MG/100ML IV EMUL
5.0000 ug/kg/min | INTRAVENOUS | Status: DC
  Administered 2020-12-16: 80 ug/kg/min via INTRAVENOUS
  Administered 2020-12-16: 10 ug/kg/min via INTRAVENOUS
  Administered 2020-12-16: 80 ug/kg/min via INTRAVENOUS
  Administered 2020-12-17: 35 ug/kg/min via INTRAVENOUS
  Administered 2020-12-17: 80 ug/kg/min via INTRAVENOUS
  Administered 2020-12-17: 55 ug/kg/min via INTRAVENOUS
  Administered 2020-12-17: 80 ug/kg/min via INTRAVENOUS
  Administered 2020-12-17: 65 ug/kg/min via INTRAVENOUS
  Administered 2020-12-17: 80 ug/kg/min via INTRAVENOUS
  Administered 2020-12-17: 45 ug/kg/min via INTRAVENOUS
  Administered 2020-12-17: 80 ug/kg/min via INTRAVENOUS
  Administered 2020-12-18: 40 ug/kg/min via INTRAVENOUS
  Administered 2020-12-18: 30 ug/kg/min via INTRAVENOUS
  Administered 2020-12-18 (×3): 40 ug/kg/min via INTRAVENOUS
  Administered 2020-12-19: 30 ug/kg/min via INTRAVENOUS
  Administered 2020-12-19: 45 ug/kg/min via INTRAVENOUS
  Administered 2020-12-19 (×2): 20 ug/kg/min via INTRAVENOUS
  Administered 2020-12-20: 30 ug/kg/min via INTRAVENOUS
  Administered 2020-12-20: 15 ug/kg/min via INTRAVENOUS
  Filled 2020-12-16 (×6): qty 100
  Filled 2020-12-16 (×2): qty 200
  Filled 2020-12-16 (×2): qty 100
  Filled 2020-12-16 (×3): qty 200
  Filled 2020-12-16 (×5): qty 100

## 2020-12-16 MED ORDER — THIAMINE HCL 100 MG/ML IJ SOLN
100.0000 mg | Freq: Every day | INTRAMUSCULAR | Status: DC
  Administered 2020-12-25 – 2021-01-02 (×9): 100 mg via INTRAVENOUS
  Filled 2020-12-16 (×10): qty 2

## 2020-12-16 MED ORDER — FOLIC ACID 5 MG/ML IJ SOLN
1.0000 mg | Freq: Every day | INTRAMUSCULAR | Status: DC
  Administered 2020-12-16 – 2020-12-22 (×7): 1 mg via INTRAVENOUS
  Filled 2020-12-16 (×8): qty 0.2

## 2020-12-16 MED ORDER — CISATRACURIUM BESYLATE 20 MG/10ML IV SOLN
0.1000 mg/kg | INTRAVENOUS | Status: DC | PRN
  Administered 2020-12-16: 10.2 mg via INTRAVENOUS
  Filled 2020-12-16 (×2): qty 10

## 2020-12-16 MED ORDER — LEVETIRACETAM 100 MG/ML PO SOLN
1000.0000 mg | Freq: Two times a day (BID) | ORAL | Status: DC
  Administered 2020-12-16 – 2020-12-22 (×13): 1000 mg
  Filled 2020-12-16 (×13): qty 10

## 2020-12-16 MED ORDER — NOREPINEPHRINE 16 MG/250ML-% IV SOLN
0.0000 ug/min | INTRAVENOUS | Status: DC
  Administered 2020-12-16: 2 ug/min via INTRAVENOUS
  Administered 2020-12-17: 34 ug/min via INTRAVENOUS
  Administered 2020-12-17: 36 ug/min via INTRAVENOUS
  Administered 2020-12-18: 16 ug/min via INTRAVENOUS
  Filled 2020-12-16 (×4): qty 250

## 2020-12-16 MED ORDER — HEPARIN SODIUM (PORCINE) 1000 UNIT/ML DIALYSIS
1000.0000 [IU] | INTRAMUSCULAR | Status: DC | PRN
  Administered 2020-12-16 – 2020-12-18 (×2): 3000 [IU] via INTRAVENOUS_CENTRAL
  Filled 2020-12-16 (×3): qty 6
  Filled 2020-12-16: qty 3
  Filled 2020-12-16 (×2): qty 6

## 2020-12-16 MED ORDER — SODIUM CHLORIDE 0.9 % IV SOLN
250.0000 mg | Freq: Every day | INTRAVENOUS | Status: AC
Start: 2020-12-19 — End: 2020-12-24
  Administered 2020-12-19 – 2020-12-24 (×6): 250 mg via INTRAVENOUS
  Filled 2020-12-16 (×6): qty 2.5

## 2020-12-16 MED ORDER — PRISMASOL BGK 4/2.5 32-4-2.5 MEQ/L EC SOLN
Status: DC
  Filled 2020-12-16 (×55): qty 5000

## 2020-12-16 MED ORDER — CEFAZOLIN SODIUM-DEXTROSE 2-4 GM/100ML-% IV SOLN
2.0000 g | Freq: Two times a day (BID) | INTRAVENOUS | Status: AC
  Administered 2020-12-16: 2 g via INTRAVENOUS
  Filled 2020-12-16: qty 100

## 2020-12-16 MED ORDER — VITAMIN B-12 1000 MCG PO TABS
1000.0000 ug | ORAL_TABLET | Freq: Every day | ORAL | Status: DC
  Filled 2020-12-16 (×2): qty 1

## 2020-12-16 MED ORDER — MAGNESIUM SULFATE 2 GM/50ML IV SOLN
2.0000 g | Freq: Once | INTRAVENOUS | Status: AC
  Administered 2020-12-16: 2 g via INTRAVENOUS
  Filled 2020-12-16: qty 50

## 2020-12-16 MED ORDER — PRISMASOL BGK 4/2.5 32-4-2.5 MEQ/L REPLACEMENT SOLN
Status: DC
  Filled 2020-12-16 (×15): qty 5000

## 2020-12-16 MED ORDER — CYANOCOBALAMIN 1000 MCG/ML IJ SOLN
1000.0000 ug | Freq: Every day | INTRAMUSCULAR | Status: AC
  Administered 2020-12-16 – 2020-12-22 (×7): 1000 ug via INTRAMUSCULAR
  Filled 2020-12-16 (×7): qty 1

## 2020-12-16 MED ORDER — SODIUM CHLORIDE 0.9 % FOR CRRT
INTRAVENOUS_CENTRAL | Status: DC | PRN
Start: 2020-12-16 — End: 2020-12-21

## 2020-12-16 NOTE — Progress Notes (Signed)
This RT took over care of pt while in IR. RT assisted with transportation of this pt from IR while on full ventilatory support, to CT. From CT pt was then transported to 4N19. Pt tolerated well with no complications and SVS. RN at bedside at this time.

## 2020-12-16 NOTE — Progress Notes (Signed)
Neurosurgery Service Progress Note  Subjective: No acute events overnight, pt was in IR getting his filter when I was rounding this morning  Objective: Vitals:   12/16/20 0800 12/16/20 0815 12/16/20 0937 12/16/20 0950  BP:  (!) 110/51 (!) 122/57 126/60  Pulse:  100 (!) 101 (!) 104  Resp:  '18 16 15  '$ Temp: 100.2 F (37.9 C)     TempSrc: Axillary     SpO2:  93% 100% 100%  Weight:      Height:        Physical Exam: Pt off the floor on rounds this morning  Assessment & Plan: 60 y.o. man POD2 s/p multi-approach T10-pelvis.  -drain output downtrending, 700-->410 in past 24h, cont drains -PCCM recs -no change in neurosurgical plan of care  Judith Part  12/16/20 10:21 AM

## 2020-12-16 NOTE — Sedation Documentation (Signed)
No sedation was required at this time. Pt to;erated procedure will, vitals remained stable. Procedure complete.

## 2020-12-16 NOTE — Progress Notes (Signed)
vLTM EEG started. Notified Neuro. Pt monitored

## 2020-12-16 NOTE — Progress Notes (Signed)
NAME:  Keith Dixon, MRN:  [id number], DOB:  1961-02-03, LOS: 4 ADMISSION DATE:  12/12/2020, CONSULTATION DATE:  12/16/20  REFERRING MD:  Grandville Silos, CHIEF COMPLAINT:   respiratory depression  History of Present Illness:  Keith Dixon is a 60 y/o M with PMH significant for severe degenerative scoliosis with multi-level listhesis, anxiety, bipolar disorder, PTSD, seizures, OSA and tobacco use who was admitted to neurosurgery for L1-2, L2-3, L3-4, L4-5 DLIFs initially and subsequent T10-pelvis decompression and fusion.   His initial surgery was 8/3 and was slow to wake up, after second  surgery 8/4  (10hr procedure) pt was again slow to awaken post-anesthesia, so decision was made to leave patient intubated overnight.  He was transfused two units PRBC intra-operatively.   Significant Hospital Events:  8/3 Admit to neurosurgery, underwent  anterior lateral Interbody fusion of L1-L5, notably had difficult foley insertion requiring urethral dilation 8/4 Stage 2 posterior decompression and fusion, left intubated post-procedure and PCCM consulted 8/5 AKI seen on am labs with evidence of volume depletion on POC [place] 8/6: Patient AKI was due to acute rhabdomyolysis in the setting of prolonged surgery, he remained oliguric.  Ultrasound lower extremity showed acute right lower extremity DVT and left IJ DVT  Interim History / Subjective:  Patient serum creatinine continue to trend up, he remained oliguric Patient is not waking up despite minimal sedation CT head was done showed small multiple bilateral strokes  Objective   Blood pressure (!) 88/55, pulse (!) 102, temperature 100.2 F (37.9 C), temperature source Axillary, resp. rate (!) 27, height 6' (1.829 m), weight 101.7 kg, SpO2 100 %.    Vent Mode: PSV;CPAP FiO2 (%):  [40 %] 40 % Set Rate:  [18 bmp] 18 bmp PEEP:  [5 cmH20] 5 cmH20 Pressure Support:  [5 cmH20] 5 cmH20   Intake/Output Summary (Last 24 hours) at 12/16/2020 1115 Last data filed at  12/16/2020 0800 Gross per 24 hour  Intake 7081.37 ml  Output 480 ml  Net 6601.37 ml   Filed Weights   12/12/20 0718 12/15/20 0500 12/16/20 0500  Weight: 108.9 kg 104.9 kg 101.7 kg   Physical Exam  General: Critically ill-appearing middle-aged African-American male, lying in the bed, orally intubated  HEENT: Atraumatic, normocephalic ETT, MM pink/moist, PERRL,  Neuro: Opens eyes with vocal stimuli, does not follow commands, flicker movement in right lower extremity, no response to pain in all other extremities CV: s1s2 regular rate and rhythm, no murmur, rubs, or gallops,  PULM:  Diminished bilaterally, no added breath sounds, no increased work of breathing,  GI: soft, bowel sounds active in all 4 quadrants, non-tender, non-distended Extremities: warm/dry, no edema  Skin: no rashes or lesions  Resolved Hospital Problem list     Assessment & Plan:  Acute respiratory failure with hypoxia and hypercapnia due to post-op respiratory depression Continue ventilator support with lung protective strategies  Currently on pressure support trial, tolerating well Mental status continued remain poor, preclude extubation Head of bed elevated 30 degrees. Peak, plateau and driving pressures are at goal Ensure adequate pulmonary hygiene  Follow cultures  VAP bundle in place  PAD protocol On minimal sedation with Precedex 0.2 and fentanyl at 50   Acute rhabdomyolysis in the setting of prolonged surgery and hypotension due to blood loss during surgery Oliguric acute renal failure due to rhabdomyolysis Patient urine output was 310 cc in last 24-hour He was given albumin with Lasix without improvement in urine output Foley catheter is in place Appreciate nephrology input,  patient will be started on CRRT Follow renal function  Monitor urine output Avoid nephrotoxins Ensure adequate renal perfusion  Discontinue IV fluid  Acute bilateral multiple strokes Patient has not been waking up despite  being on minimal sedation CT head was done which showed small bilateral multiple strokes Neurology was consulted Will send TSH, ammonia, B12 and folate levels Will ask neurosurgery if okay to start aspirin  Lumbar spinal stenosis status post decompression and fusion surgery Drain in place with improved output Closely monitor drain output Monitor H&H Neurosurgery is following, defer management to primary team  Hypotension due to sedation and volume depletion  Patient is requiring IV vasopressors to maintain map goal of 65 Continue to titrate vasopressors Home antihypertensives on hold  Acute right lower extremity DVT, acute left IJ DVT Initially patient was started on IV heparin infusion Neurosurgery recommend stopping IV heparin considering high drain output IVC filter was placed this morning  Acute metabolic encephalopathy Seizures disorder Hx of Bipolar 1 disorder  Continue home Zoloft, Seroquel but reduces Baclofen and gabapentin to avoid withdrawal PRN Flexeril, Oxy, and Dilaudid remain but will not be used while intubated  Maintain neuro protective measures; goal for eurothermia, euglycemia, eunatermia, normoxia, and PCO2 goal of 35-40 Nutrition and bowel regiment  Seizure precautions  Continue home Keprra  Aspirations precautions  Will obtain EEG to rule out active seizures  Tobacco dependence Smoking cessation counseling, once extubated  Hypocalcemia/hyponatremia/hyperphosphatemia Closely monitor electrolytes and supplement   Best Practice    Diet/type: NPO DVT prophylaxis: SCD GI prophylaxis: PPI Lines: Yes, still needed Foley: Yes, still needed Code Status:  full code Last date of multidisciplinary goals of care discussion: 8/7 patient's family was updated at bedside  Critical care time:   Total critical care time: 48 minutes  Performed by: Jacky Kindle   Critical care time was exclusive of separately billable procedures and treating other patients.    Critical care was necessary to treat or prevent imminent or life-threatening deterioration.   Critical care was time spent personally by me on the following activities: development of treatment plan with patient and/or surrogate as well as nursing, discussions with consultants, evaluation of patient's response to treatment, examination of patient, obtaining history from patient or surrogate, ordering and performing treatments and interventions, ordering and review of laboratory studies, ordering and review of radiographic studies, pulse oximetry and re-evaluation of patient's condition.   Jacky Kindle MD [facility] See Amion for pager If no response to pager, please call [phone] until 7pm After 7pm, Please call E-link [phone]

## 2020-12-16 NOTE — Procedures (Signed)
Central Venous Catheter Insertion Procedure Note  Keith Dixon  [id number]  [birth year]  Date:12/16/20  Time:1:14 PM   Provider Performing:Esther Broyles   Procedure: Insertion of Non-tunneled Central Venous Catheter(36556)with US guidance [id number])    Indication(s) Hemodialysis  Consent Risks of the procedure as well as the alternatives and risks of each were explained to the patient and/or caregiver.  Consent for the procedure was obtained and is signed in the bedside chart  Anesthesia Topical only with 1% lidocaine   Timeout Verified patient identification, verified procedure, site/side was marked, verified correct patient position, special equipment/implants available, medications/allergies/relevant history reviewed, required imaging and test results available.  Sterile Technique Maximal sterile technique including full sterile barrier drape, hand hygiene, sterile gown, sterile gloves, mask, hair covering, sterile ultrasound probe cover (if used).  Procedure Description Area of catheter insertion was cleaned with chlorhexidine and draped in sterile fashion.   With real-time ultrasound guidance a HD catheter was placed into the left femoral vein.  Nonpulsatile blood flow and easy flushing noted in all ports.  The catheter was sutured in place and sterile dressing applied.  Complications/Tolerance None; patient tolerated the procedure well. Chest X-ray is ordered to verify placement for internal jugular or subclavian cannulation.  Chest x-ray is not ordered for femoral cannulation.  EBL Minimal  Specimen(s) None

## 2020-12-16 NOTE — Procedures (Signed)
Interventional Radiology Procedure Note  Procedure: IVC filter placement  Findings: Please refer to procedural dictation for full description. Infrarenal Denali IVC filter placed from right IJ access.  Complications: None immediate  Estimated Blood Loss: < 5 mL  Recommendations: Plan for outpatient IR follow up with repeat lower extremity duplex in 3 months for consideration of filter retrieval.   Ruthann Cancer, MD Pager: [phone]

## 2020-12-16 NOTE — Progress Notes (Signed)
EEG completed, results pending. 

## 2020-12-16 NOTE — Progress Notes (Signed)
Patient placed in CPAP/PS for better compliance with the vent. Patient tolerating well with good volumes and RR. RN made aware.

## 2020-12-16 NOTE — Progress Notes (Addendum)
eLink Physician-Brief Progress Note Patient Name: Keith Dixon DOB: [birth year] MRN: [id number]   Date of Service  12/16/2020  HPI/Events of Note  RN called with issues related to vent synchrony. Patient is on 36 mic of propofol and 200 mic/hour fentanyl drip. Seen on camera. Seems to desaturate only when he is having issues with synchrony. Not waking up much at all and is on a continuous EEG  eICU Interventions  Versed 2 mg x 1, increase fentanyl to 250 mic/hour Currently on 50% fio2 and on PC mode, RT said these changes were made during the day time but order not updated. I placed the order  Asked RN to let me know if this does not help. We may need to try a paralytic . He is fully sedated at this time and not agitated, not moving around for [place] to think that sedation is an issue     Intervention Category Major Interventions: Respiratory failure - evaluation and management  Emlyn Maves G [place] Sigmund 12/16/2020, 9:06 PM  10:05 pm - Patient still has vent dyssynchrony despite heavy sedation  Try a parlaytic push In addition, ABG also noted to have hypercapnia CXR, vent changes to be made - increase RR to 26, repeat ABG at midnight   11 pm Notified of hypotension Seen on camera Had been given a nimbex push but dropped his VT and also got hypotensive so switched back to [employer] Synchronous with vent, O2 sat 100. SBP 150s, levophed being weaned down  ABG to be done as planned  3 am ABG noted Very comfortable on camera Increase RR to 24 Recheck abg at 6 am   6 am ABG noted Increase RR to 28 VT is already set at 8 cc/kg IBW  Slight adjustment made since patient also with strokes ABG to be repeated at 8 am

## 2020-12-16 NOTE — Progress Notes (Signed)
Patient ID: Keith Dixon, male   DOB: [birth year], 60 y.o.   MRN: [id number] [facility] Progress Note   Assessment/ Plan:   1.  Acute kidney injury: Appears to be from a combination of ATN and rhabdomyolysis/pigment nephropathy.  He has dense renal injury and was oliguric overnight.  Discussed plan with critical care service for placement of dialysis catheter and initiation of CRRT for correction of multiple metabolic abnormalities and volume unloading. 2.  Hyponatremia: Secondary to free water handling defect in the setting of acute kidney injury, unsuccessful improvement with diuresis, will now monitor with CRRT/volume unloading. 3.  Acute postoperative hypoxic/hypercapnic respiratory failure: Intubated and on ventilator support per CCM. 4.  Lumbar spine stenosis/scoliosis status post decompression and fusion surgery: Ongoing management/surveillance by neurosurgery. 5.  Shock: Secondary to distributive/hypovolemic mechanism.  Status post intravenous fluids and now on pressors. 6.  Acute blood loss anemia: Secondary to surgical associated losses, ongoing PRBC transfusion for continued downtrend of hemoglobin and hematocrit. 7.  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  Subjective:   Earlier today had IVC filter placement.  CT scan of the head showed small acute bilateral cortical infarcts and left thalamic with probable bilateral cerebral infarcts suggestive of embolic disease.   Objective:   BP (!) 111/55   Pulse (!) 103   Temp 98.8 F (37.1 C) (Oral)   Resp 18   Ht 6' (1.829 m)   Wt 101.7 kg   SpO2 91%   BMI 30.41 kg/m   Intake/Output Summary (Last 24 hours) at 12/16/2020 1128 Last data filed at 12/16/2020 1115 Gross per 24 hour  Intake 7396.37 ml  Output 480 ml  Net 6916.37 ml   Weight change: -3.2 kg  Physical Exam: Gen: Intubated, unresponsive, girlfriend ([place]) at bedside CVS: Pulse regular rhythm, tachycardic,  S1 and S2 normal Resp: Anteriorly clear to auscultation, no rales/rhonchi Abd: Soft, obese, nontender, bowel sounds normal Ext: 2+ pitting lower extremity edema, 1+ edema over back and upper extremities  Imaging: CT HEAD WO CONTRAST (5MM)  Result Date: 12/16/2020 CLINICAL DATA:  Mental status change EXAM: CT HEAD WITHOUT CONTRAST TECHNIQUE: Contiguous axial images were obtained from the base of the skull through the vertex without intravenous contrast. COMPARISON:  09/11/2020 FINDINGS: Brain: Small acute appearing infarcts seen along the bilateral cerebral cortex, including right frontal, right occipital, and left parietal. Subcortical infarcts may have also occurred and there is also a new left thalamic lacunar infarct. Suspect small bilateral cerebellar infarcts, limitation in the posterior fossa and ventral cerebrum due to streak artifact/motion. No hemorrhage, hydrocephalus, or masslike finding. Vascular: No detected hyperdense vessel. Skull: Negative Sinuses/Orbits: Negative Dermal inclusion cyst along the right temporal scalp. Other: To expedite communication, these results will be called to the ordering clinician or representative/RN by the Radiologist Assistant, and communication documented in the PACS or [employer]. IMPRESSION: Small recent appearing bilateral cortical infarcts, left thalamic and probable bilateral cerebellar infarcts. The multifocality suggests embolic disease. Electronically Signed   By: [name] M.D.   On: 12/16/2020 10:42   IR IVC FILTER PLMT / S&I Burke Keels GUID/MOD SED  Result Date: 12/16/2020 CLINICAL DATA:  60 year old male with history of recent extensive spinal fusion surgery and diagnosis of lower extremity deep vein thrombosis, unable to tolerate anticoagulation giving bleeding from surgical site. EXAM: 1. ULTRASOUND GUIDANCE FOR VASCULAR ACCESS OF THE RIGHT internal jugular VEIN. 2. IVC VENOGRAM. 3. PERCUTANEOUS IVC FILTER PLACEMENT. ANESTHESIA/SEDATION: None.  CONTRAST:  77m OMNIPAQUE IOHEXOL  300 MG/ML  SOLN FLUOROSCOPY TIME:  24 seconds, 135 mGy PROCEDURE: The procedure, risks, benefits, and alternatives were explained to the patient. Questions regarding the procedure were encouraged and answered. The patient understands and consents to the procedure. The patient was prepped with Betadine in a sterile fashion, and a sterile drape was applied covering the operative field. A sterile gown and sterile gloves were used for the procedure. Local anesthesia was provided with 1% Lidocaine. Under direct ultrasound guidance, a 21 gauge needle was advanced into the right internal jugular vein with ultrasound image documentation performed. After securing access with a micropuncture dilator, a guidewire was advanced into the inferior vena cava. A deployment sheath was advanced over the guidewire. This was utilized to perform IVC venography. The deployment sheath was further positioned in an appropriate location for filter deployment. A Denali IVC filter was then advanced in the sheath. This was then fully deployed in the infrarenal IVC. Final filter position was confirmed with a fluoroscopic spot image. Contrast injection was also performed through the sheath under fluoroscopy to confirm patency of the IVC at the level of the filter. After the procedure the sheath was removed and hemostasis obtained with manual compression. COMPLICATIONS: None. FINDINGS: IVC venography demonstrates a normal caliber IVC with no evidence of thrombus. Renal veins are identified bilaterally. The IVC filter was successfully positioned below the level of the renal veins and is appropriately oriented. This IVC filter has both permanent and retrievable indications. IMPRESSION: Placement of percutaneous IVC filter in the infrarenal IVC. IVC venogram shows no evidence of IVC thrombus and normal caliber of the inferior vena cava. This filter has both permanent and retrievable indications. PLAN: This IVC filter  is potentially retrievable. The patient will be assessed for filter retrieval by Interventional Radiology in approximately 8-12 weeks. Further recommendations regarding filter retrieval, continued surveillance or declaration of device permanence, will be made at that time. Ruthann Cancer, MD [facility] Electronically Signed   By: Ruthann Cancer MD   On: 12/16/2020 10:27   DG CHEST PORT 1 VIEW  Result Date: 12/14/2020 CLINICAL DATA:  Central line placement EXAM: PORTABLE CHEST 1 VIEW COMPARISON:  12/13/2020 FINDINGS: Interval placement of right IJ central venous catheter with distal tip terminating at the level of the distal SVC. ET tube terminates approximately 5.7 cm above the carina. Enteric tube terminates within the stomach. Stable heart size. Low lung volumes. No pneumothorax. Midline surgical staples. IMPRESSION: Interval placement of right IJ central venous catheter. No pneumothorax. Electronically Signed   By: Davina Poke D.O.   On: 12/14/2020 15:43   ECHOCARDIOGRAM COMPLETE  Result Date: 12/14/2020    ECHOCARDIOGRAM REPORT   Patient Name:   ALFIE RIDEAUX Date of Exam: 12/14/2020 Medical Rec #:  [id number]        Height:       72.0 in Accession #:    [id number]       Weight:       240.0 lb Date of Birth:  November 09, 1960         BSA:          2.302 m Patient Age:    66 years         BP:           95/62 mmHg Patient Gender: M                HR:           71 bpm. Exam  Location:  Inpatient Procedure: 2D Echo, Cardiac Doppler and Color Doppler Indications:    Cardiomegaly  History:        Patient has no prior history of Echocardiogram examinations.                 Risk Factors:Hypertension and Current Smoker.  Sonographer:    Wenda Low Referring Phys: [place]  1. Left ventricular ejection fraction, by estimation, is 65 to 70%. The left ventricle has normal function. The left ventricle has no regional wall motion  abnormalities. There is moderate concentric left ventricular hypertrophy. Left ventricular diastolic parameters were normal.  2. Right ventricular systolic function is normal. The right ventricular size is normal.  3. The mitral valve is grossly normal. No evidence of mitral valve regurgitation.  4. The aortic valve is normal in structure. There is mild calcification of the aortic valve. Aortic valve regurgitation is mild to moderate. FINDINGS  Left Ventricle: Left ventricular ejection fraction, by estimation, is 65 to 70%. The left ventricle has normal function. The left ventricle has no regional wall motion abnormalities. The left ventricular internal cavity size was small. There is moderate  concentric left ventricular hypertrophy. Left ventricular diastolic parameters were normal. Right Ventricle: The right ventricular size is normal. Right vetricular wall thickness was not well visualized. Right ventricular systolic function is normal. Left Atrium: Left atrial size was normal in size. Right Atrium: Right atrial size was normal in size. Pericardium: There is no evidence of pericardial effusion. Mitral Valve: The mitral valve is grossly normal. No evidence of mitral valve regurgitation. MV peak gradient, 3.3 mmHg. The mean mitral valve gradient is 1.0 mmHg. Tricuspid Valve: The tricuspid valve is normal in structure. Tricuspid valve regurgitation is trivial. Aortic Valve: The aortic valve is normal in structure. There is mild calcification of the aortic valve. Aortic valve regurgitation is mild to moderate. Aortic valve mean gradient measures 5.0 mmHg. Aortic valve peak gradient measures 9.1 mmHg. Aortic valve area, by VTI measures 3.15 cm. Pulmonic Valve: The pulmonic valve was normal in structure. Pulmonic valve regurgitation is not visualized. Aorta: The aortic root and ascending aorta are structurally normal, with no evidence of dilitation. IAS/Shunts: The atrial septum is grossly normal.  LEFT VENTRICLE  PLAX 2D LVIDd:         4.50 cm     Diastology LVIDs:         2.60 cm     LV e' medial:    6.57 cm/s LV PW:         1.40 cm     LV E/e' medial:  5.2 LV IVS:        1.30 cm     LV e' lateral:   8.76 cm/s LVOT diam:     2.20 cm     LV E/e' lateral: 3.9 LV SV:         73 LV SV Index:   32 LVOT Area:     3.80 cm  LV Volumes (MOD) LV vol d, MOD A2C: 54.6 ml LV vol d, MOD A4C: 73.6 ml LV vol s, MOD A2C: 27.3 ml LV vol s, MOD A4C: 33.3 ml LV SV MOD A2C:     27.3 ml LV SV MOD A4C:     73.6 ml LV SV MOD BP:      32.8 ml RIGHT VENTRICLE RV Basal diam:  5.00 cm RV Mid diam:    3.60 cm LEFT ATRIUM  Index       RIGHT ATRIUM           Index LA diam:        3.70 cm 1.61 cm/m  RA Area:     15.70 cm LA Vol (A2C):   59.0 ml 25.63 ml/m RA Volume:   33.70 ml  14.64 ml/m LA Vol (A4C):   56.7 ml 24.63 ml/m LA Biplane Vol: 57.9 ml 25.15 ml/m  AORTIC VALVE AV Area (Vmax):    2.95 cm AV Area (Vmean):   2.59 cm AV Area (VTI):     3.15 cm AV Vmax:           151.00 cm/s AV Vmean:          107.000 cm/s AV VTI:            0.232 m AV Peak Grad:      9.1 mmHg AV Mean Grad:      5.0 mmHg LVOT Vmax:         117.00 cm/s LVOT Vmean:        72.900 cm/s LVOT VTI:          0.192 m LVOT/AV VTI ratio: 0.83  AORTA Ao Root diam: 3.10 cm MITRAL VALVE MV Area (PHT): 2.57 cm    SHUNTS MV Area VTI:   4.08 cm    Systemic VTI:  0.19 m MV Peak grad:  3.3 mmHg    Systemic Diam: 2.20 cm MV Mean grad:  1.0 mmHg MV Vmax:       0.91 m/s MV Vmean:      38.7 cm/s MV Decel Time: 296 msec MV E velocity: 34.15 cm/s MV A velocity: 52.90 cm/s MV E/A ratio:  0.65 Mertie Moores MD Electronically signed by Mertie Moores MD Signature Date/Time: 12/14/2020/4:25:28 PM    Final    VAS [place] LOWER EXTREMITY VENOUS (DVT)  Result Date: 12/14/2020  Lower Venous DVT Study Patient Name:  PHILLIP MAFFEI  Date of Exam:   12/14/2020 Medical Rec #: [id number]         Accession #:    [id number] Date of Birth: Aug 24, 1960          Patient Gender: M Patient Age:   5 years Exam  Location:  [facility] Procedure:      VAS [place] LOWER EXTREMITY VENOUS (DVT) Referring Phys: Loree Fee HARRIS --------------------------------------------------------------------------------  Indications: Swelling, and recent extensive spinal surgery.  Comparison Study: No prior studies. Performing Technologist: Maudry Mayhew MHA, RDMS, RVT, RDCS  Examination Guidelines: A complete evaluation includes B-mode imaging, spectral Doppler, color Doppler, and power Doppler as needed of all accessible portions of each vessel. Bilateral testing is considered an integral part of a complete examination. Limited examinations for reoccurring indications may be performed as noted. The reflux portion of the exam is performed with the patient in reverse Trendelenburg.  +---------+---------------+---------+-----------+----------+--------------+ RIGHT    CompressibilityPhasicitySpontaneityPropertiesThrombus Aging +---------+---------------+---------+-----------+----------+--------------+ CFV      Full           Yes      Yes                                 +---------+---------------+---------+-----------+----------+--------------+ SFJ      Full                                                        +---------+---------------+---------+-----------+----------+--------------+  FV Prox  Full                                                        +---------+---------------+---------+-----------+----------+--------------+ FV Mid   Full                                                        +---------+---------------+---------+-----------+----------+--------------+ FV DistalFull                                                        +---------+---------------+---------+-----------+----------+--------------+ PFV      None                    No                   Acute          +---------+---------------+---------+-----------+----------+--------------+ POP      Full           Yes       Yes                                 +---------+---------------+---------+-----------+----------+--------------+ PTV      Full                                                        +---------+---------------+---------+-----------+----------+--------------+ PERO     Full                                                        +---------+---------------+---------+-----------+----------+--------------+ Gastroc  None                    No                   Acute          +---------+---------------+---------+-----------+----------+--------------+ GSV      None                                         Acute          +---------+---------------+---------+-----------+----------+--------------+   +---------+---------------+---------+-----------+----------+--------------+ LEFT     CompressibilityPhasicitySpontaneityPropertiesThrombus Aging +---------+---------------+---------+-----------+----------+--------------+ CFV      Full           Yes      Yes                                 +---------+---------------+---------+-----------+----------+--------------+ SFJ  Full                                                        +---------+---------------+---------+-----------+----------+--------------+ FV Prox  Full                                                        +---------+---------------+---------+-----------+----------+--------------+ FV Mid   Full                                                        +---------+---------------+---------+-----------+----------+--------------+ FV DistalFull                                                        +---------+---------------+---------+-----------+----------+--------------+ PFV      Full                                                        +---------+---------------+---------+-----------+----------+--------------+ POP      Full           Yes      Yes                                  +---------+---------------+---------+-----------+----------+--------------+ PTV      Full                                                        +---------+---------------+---------+-----------+----------+--------------+ PERO     Full                                                        +---------+---------------+---------+-----------+----------+--------------+     Summary: RIGHT: - Findings consistent with acute deep vein thrombosis involving the right proximal profunda vein, and right gastrocnemius veins. - Findings consistent with acute superficial vein thrombosis involving the right great saphenous vein. - No cystic structure found in the popliteal fossa.  LEFT: - There is no evidence of deep vein thrombosis in the lower extremity.  - No cystic structure found in the popliteal fossa.  *See table(s) above for measurements and observations. Electronically signed by Jamelle Haring on 12/14/2020 at 5:31:08 PM.    Final     Labs: BMET Recent Labs  Lab 12/13/20 0740 12/13/20 1513 12/13/20 2008 12/13/20 2234 12/14/20 3295 12/14/20 1884 12/14/20 1745  12/14/20 2154 12/15/20 0459 12/16/20 0509  NA 134*   < > 135 135 134* 133* 132* 132* 130* 128*  K 3.5   < > 5.1 5.1 4.9 4.6 4.7 4.4 4.4 4.6  CL 101  --  103  --  101  --  104  --  101 98  CO2 22  --   --   --  21*  --  18*  --  19* 19*  GLUCOSE 116*  --  150*  --  141*  --  152*  --  164* 124*  BUN 10  --  17  --  23*  --  32*  --  44* 62*  CREATININE 1.05  --  1.50*  --  2.61*  --  4.05*  --  4.91* 6.63*  CALCIUM 9.5  --   --   --  7.7*  --  7.5*  --  7.1* 7.2*  PHOS  --   --   --   --  6.5*  --  6.3*  --  5.5* 5.4*   < > = values in this interval not displayed.   CBC Recent Labs  Lab 12/13/20 0740 12/13/20 1513 12/14/20 0458 12/14/20 0851 12/14/20 1745 12/14/20 2154 12/15/20 0459 12/16/20 0509  WBC 13.2*   < > 16.3*  --  15.4*  --  14.0* 9.1  NEUTROABS 11.1*  --   --   --   --   --   --   --   HGB 13.4   < > 12.9*   < >  10.4* 9.5* 8.9* 7.5*  HCT 40.8   < > 37.8*   < > 30.5* 28.0* 26.6* 22.4*  MCV 81.0   < > 83.3  --  82.9  --  84.4 84.2  PLT 222   < > 134*  --  ACLMP  --  PLATELET CLUMPS NOTED ON SMEAR, UNABLE TO ESTIMATE 72*   < > = values in this interval not displayed.   Medications:     sodium chloride   Intravenous Once   chlorhexidine gluconate (MEDLINE KIT)  15 mL Mouth Rinse BID   Chlorhexidine Gluconate Cloth  6 each Topical Daily   cholecalciferol  2,000 Units Per Tube BID AC & HS   docusate  100 mg Per Tube BID   feeding supplement (PIVOT 1.5 CAL)  1,000 mL Per Tube Q24H   gabapentin  300 mg Per Tube Q12H   lamoTRIgine  100 mg Per Tube BID   levETIRAcetam  500 mg Per Tube BID   lidocaine       mouth rinse  15 mL Mouth Rinse 10 times per day   pantoprazole sodium  40 mg Per Tube QHS   QUEtiapine  200 mg Per Tube QHS   sertraline  100 mg Per Tube QPC breakfast   sodium chloride flush  3 mL Intravenous Q12H   Elmarie Shiley, MD 12/16/2020, 11:28 AM

## 2020-12-16 NOTE — Consult Note (Signed)
NEUROLOGY CONSULTATION NOTE   Date of service: December 16, 2020 Patient Name: Keith Dixon MRN:  [id number] DOB:  [birth year] Reason for consult: "Encephalopathy and strokes" Requesting Provider: Vallarie Mare, MD _ _ _   _ __   _ __ _ _  __ __   _ __   __ _  History of Present Illness  Keith Dixon is a 60 y.o. male with PMH significant for PTSD, OSA, HTN, tobacco use, ?seizures, bipolar, degenerativse scoliosis who underwent extensive T10-pelvis decompression and fusion over 2 days(8/4 and 8/5) and difficulty waking up after surgery. Also complicated by Aki, rhabdomyolysis, DVT in RLE and left IJ s/p IVC filter and poor mentation/encephalopathy. Workup with CTH demonstrated embolic appearing strokes and neurology consulted.     ROS   Unable to obtain ROS 2/2 intubation and sedation.  Past History   Past Medical History:  Diagnosis Date  . Anxiety   . Arthritis   . Bipolar 1 disorder, depressed ([place])   . Bipolar disorder ([place])   . Depression   . History of posttraumatic stress disorder (PTSD)   . Hypertension   . PTSD (post-traumatic stress disorder)   . Seizures ([place])   . Sleep apnea   . Smoker   . Tobacco use disorder    Past Surgical History:  Procedure Laterality Date  . ANTERIOR LATERAL LUMBAR FUSION 4 LEVELS N/A 12/12/2020   Procedure: Anterior Lateral Interbody Fusion Lumbar One-Two, Lumbar Two-Three, Lumbar Three-Four, Lumbar Four-Five;  Surgeon: Franchot Gallo, MD;  Location: [place];  Service: Urology;  Laterality: N/A;  Anterior Lateral Interbody Fusion Lumbar One-Two, Lumbar Two-Three, Lumbar Three-Four, Lumbar Four-Five  . CYSTOSCOPY  12/12/2020   Procedure: CYSTOSCOPY, URETHRAL DILATION, DIFFICULT FOLEY INSERTION;  Surgeon: Franchot Gallo, MD;  Location: [place];  Service: Urology;;  . IR IVC FILTER PLMT / S&I Burke Keels GUID/MOD SED  12/16/2020   Family History  Problem Relation Age of Onset  . Seizures Mother   . Breast cancer Mother 49  . Hypertension  Brother    Social History   Socioeconomic History  . Marital status: Significant Other    Spouse name: Not on file  . Number of children: 1  . Years of education: Not on file  . Highest education level: Not on file  Occupational History    Employer: [employer]  Tobacco Use  . Smoking status: Every Day    Packs/day: 0.50    Years: 20.00    Pack years: 10.00    Types: Cigarettes  . Smokeless tobacco: Never  Vaping Use  . Vaping Use: Never used  Substance and Sexual Activity  . Alcohol use: Yes    Alcohol/week: 2.0 standard drinks    Types: 2 Shots of liquor per week  . Drug use: Yes    Types: Marijuana    Comment: pt stated he quit 2 weeks ago 08/10/12, pt reports doing marijuana on 11/13/12  . Sexual activity: Yes  Other Topics Concern  . Not on file  Social History Narrative   ** Merged History Encounter **       Social Determinants of Health   Financial Resource Strain: Not on file  Food Insecurity: Not on file  Transportation Needs: Not on file  Physical Activity: Not on file  Stress: Not on file  Social Connections: Not on file   No Known Allergies  Medications   Medications Prior to Admission  Medication Sig Dispense Refill Last Dose  . acetaminophen (TYLENOL) 500  MG tablet Take 2 tablets (1,000 mg total) by mouth every 6 (six) hours as needed for mild pain. 30 tablet 0 12/12/2020 at 0500  . amLODipine (NORVASC) 10 MG tablet Take 10 mg by mouth in the morning.   12/12/2020 at 0500  . baclofen (LIORESAL) 10 MG tablet Take 10 mg by mouth 2 (two) times daily.   12/12/2020 at 0500  . Cholecalciferol (VITAMIN D3) 50 MCG (2000 UT) TABS Take 2,000 Units by mouth in the morning and at bedtime.   Past Week  . gabapentin (NEURONTIN) 300 MG capsule Take 900 mg by mouth 2 (two) times daily.   12/12/2020 at 0500  . hydrOXYzine (VISTARIL) 50 MG capsule Take 50 mg by mouth in the morning and at bedtime.   12/12/2020 at 0500  . lamoTRIgine (LAMICTAL) 100 MG tablet Take 1 tablet (100  mg total) by mouth 2 (two) times daily. 60 tablet 2 12/12/2020 at 0500  . levETIRAcetam (KEPPRA) 750 MG tablet Take 1,500 mg by mouth 2 (two) times daily.   12/12/2020 at 0500  . lisinopril-hydrochlorothiazide (ZESTORETIC) 20-12.5 MG tablet Take 1 tablet by mouth in the morning.   12/11/2020  . meloxicam (MOBIC) 15 MG tablet Take 15 mg by mouth daily after breakfast.   12/11/2020  . QUEtiapine (SEROQUEL) 200 MG tablet Take 200 mg by mouth at bedtime.   12/11/2020  . sertraline (ZOLOFT) 100 MG tablet Take 100 mg by mouth daily after breakfast.   12/12/2020 at [place]:   12/16/20 1700 12/16/20 1715 12/16/20 1730 12/16/20 1745  BP: (!) 106/56 124/60 140/66 (!) 151/70  Pulse: 86 86 89 93  Resp: '13 14 13 10  '$ Temp:      TempSrc:      SpO2: 98% (!) 83% 95% 98%  Weight:      Height:         Body mass index is 30.41 kg/m.  Physical Exam   General: Laying comfortably in bed; intubated. HENT: Normal oropharynx and mucosa. Normal external appearance of ears and nose. Neck: Supple, no pain or tenderness CV: No JVD. No peripheral edema.  Pulmonary: Symmetric Chest rise. Breathing over vent.  Abdomen: Soft to touch, non-tender.  Ext: No cyanosis, edema, or deformity  Skin: No rash. Normal palpation of skin.   Musculoskeletal: Normal digits and nails by inspection. No clubbing.   Neurologic Examination on propofol  Mental status/Cognition: eyes closed, does not open to voice or loud clap. No response to nares stimulation. Brainsetm: - cough - Gag Pupils 4m bilaterally, round and sluggish response to bright light Corneals +  Motor/sensory:  Muscle bulk: normal, tone flaccid No spontaneous movements, no response to noxious stimuli  Coordination/Complex Motor:  Unable to assess.  Labs   CBC:  Recent Labs  Lab 12/13/20 0740 12/13/20 1513 12/15/20 0459 12/16/20 0509 12/16/20 1042  WBC 13.2*   < > 14.0* 9.1  --   NEUTROABS 11.1*  --   --   --   --   HGB 13.4   < > 8.9*  7.5*  --   HCT 40.8   < > 26.6* 22.4*  --   MCV 81.0   < > 84.4 84.2  --   PLT 222   < > PLATELET CLUMPS NOTED ON SMEAR, UNABLE TO ESTIMATE 72* 82*   < > = values in this interval not displayed.    Basic Metabolic Panel:  Lab Results  Component Value Date  NA 127 (L) 12/16/2020   K 4.8 12/16/2020   CO2 18 (L) 12/16/2020   GLUCOSE 114 (H) 12/16/2020   BUN 66 (H) 12/16/2020   CREATININE 6.74 (H) 12/16/2020   CALCIUM 7.0 (L) 12/16/2020   GFRNONAA 9 (L) 12/16/2020   GFRAA >60 04/25/2019   Lipid Panel:  Lab Results  Component Value Date   LDLCALC 30 12/16/2020   HgbA1c:  Lab Results  Component Value Date   HGBA1C 6.2 (H) 11/06/2010   Urine Drug Screen:     Component Value Date/Time   LABOPIA NONE DETECTED 11/26/2013 0009   COCAINSCRNUR NONE DETECTED 11/26/2013 0009   LABBENZ NONE DETECTED 11/26/2013 0009   AMPHETMU NONE DETECTED 11/26/2013 0009   THCU POSITIVE (A) 11/26/2013 0009   LABBARB NONE DETECTED 11/26/2013 0009    Alcohol Level     Component Value Date/Time   ETH <10 04/23/2019 1701    CT Head without contrast: Personally reviewed and small recent appearing bilateral cortical infarcts, left thalamic and probable bilateral cerebellar infarcts. The multifocality suggests embolic disease.  CT angio Head and Neck with contrast: Will hold off for now given AKI  MRI Brain: Unable to obtain with the recent spinal hardware  cEEG:  This EEG was obtained while unresponsive and is abnormal due to moderate diffuse slowing indicative of global cerebral dysfunction.  Impression   Keith Dixon is a 60 y.o. male with PMH significant for ith PMH significant for PTSD, OSA, HTN, tobacco use, ?seizures, bipolar, degenerativse scoliosis who underwent extensive T10-pelvis decompression and fusion over 2 days(8/4 and 8/5) and difficulty waking up after surgery. Also complicated by Aki, rhabdomyolysis, DVT in RLE and left IJ s/p IVC filter and poor  mentation/encephalopathy. Workup with CTH demonstrated embolic appearing strokes and neurology consulted. His neurologic examination on propofol 76mg/kg/minis notable for patchy brainstem reflexes, no higher cerebral function.  Thies degee of encephalopathy is not explained by strokes. Might be delayed clearance of sedation and anesthesia 2/2 AKI cou;le with him being on propofol and low normal B12, folate levels.  Recommendations   Profound toxic metabolic encephalopathy: likely multifactorial 2/2 AKI, prolonged surgery under anesthesia, low B12, folate. - cEEG for 24 hours - Vit B12 is low for neurological levels, put on IM replacement - Low folate levels, put on IV replacement - Empirically replacing high dose thiamine - Wean off propofol as tolerated. Precedex might be a better choice if concern is agitation.  Multiple embolic appearing strokes: - Frequent Neuro checks per stroke unit protocol - Recommend brain imaging with MRI Brain without contrast when able - Unable to get CT Angio head and neck right now due to AKI. Recommend CTA when able. For now, recommend TCDs and Vasc [place] duplex. - TTE with bubble study. - Lipid panel and LDL - Please start statin if LDL > 70 - HbA1c - Aspirin '81mg'$  daily when okay with neurosurgery team. - Recommend DVT ppx - SBP goal - permissive hypertension first 24 h < 220/110. Held home meds.  - Recommend Telemetry monitoring for arrythmia - Recommend bedside swallow screen prior to PO intake. - Stroke education booklet - Recommend PT/OT/SLP consult  This patient is critically ill and at significant risk of neurological worsening, death and care requires constant monitoring of vital signs, hemodynamics,respiratory and cardiac monitoring, neurological assessment, discussion with family, other specialists and medical decision making of high complexity. I spent 50 minutes of neurocritical care time  in the care of  this patient. This was time spent  independent of any time provided by nurse practitioner or PA.  Donnetta Simpers [facility] Pager Number [phone] 12/16/2020  9:56 PM   ______________________________________________________________________   Thank you for the opportunity to take part in the care of this patient. If you have any further questions, please contact the neurology consultation attending.  Signed,  [place] Pager Number [phone] _ _ _   _ __   _ __ _ _  __ __   _ __   __ _

## 2020-12-16 NOTE — Procedures (Addendum)
Routine EEG Report  Keith Dixon is a 60 y.o. male with a history of encephalopathy who is undergoing an EEG to evaluate for seizures.  Report: This EEG was acquired with electrodes placed according to the International 10-20 electrode system (including Fp1, Fp2, F3, F4, C3, C4, P3, P4, O1, O2, T3, T4, T5, T6, A1, A2, Fz, Cz, Pz). The following electrodes were missing or displaced: none.  Best background was composed of frequencies of 4-6 Hz. This activity is reactive to stimulation. No sleep architecture was identified. There was no focal slowing. There were no interictal epileptiform discharges. There were no electrographic seizures identified. Photic stimulation and hyperventilation were not performed.   Impression and clinical correlation: This EEG was obtained while unresponsive and is abnormal due to moderate diffuse slowing indicative of global cerebral dysfunction.    Su Monks, MD [facility] [phone]  If 7pm- 7am, please page neurology on call as listed in [place].

## 2020-12-16 NOTE — Progress Notes (Signed)
Critical ABG results called to Elink  [id number] 99%

## 2020-12-16 NOTE — Procedures (Addendum)
Patient Name: Keith Dixon  MRN: [id number]  Epilepsy Attending: Lora Havens  Referring Physician/Provider: Claiborne Billings, [place] Duration: 12/16/2020 1436 to 12/17/2020 1436  Patient history:  60 y.o. male with a history of encephalopathy who is undergoing an EEG to evaluate for seizures  Level of alertness: lethargic  AEDs during EEG study: propofol, LEV, LTG, GBP  Technical aspects: This EEG study was done with scalp electrodes positioned according to the 10-20 International system of electrode placement. Electrical activity was acquired at a sampling rate of '500Hz'$  and reviewed with a high frequency filter of '70Hz'$  and a low frequency filter of '1Hz'$ . EEG data were recorded continuously and digitally stored.   Description: No posterior dominant rhythm was seen.  EEG initially showed continuous generalized 3 to 6 Hz theta-delta slowing. On 12/16/2020 after around 1830, EEG showed bursts suppression pattern with bursts of 4-'6hz'$  theta-delta slowing lasting 2-3 seconds alternating with eeg suppression lasting 8-10 seconds. Hyperventilation and photic stimulation were not performed.     EKG, artifact, significant myogenic artifact and electrode artifact was seen throughout the study.   ABNORMALITY - Continuous slow, generalized - Burst suppression, generalized  IMPRESSION: This technically difficult study was initially suggestive of moderate diffuse encephalopathy, nonspecific etiology. After around 1830 on 12/16/2020, eeg was suggestive of profound diffuse encephalopathy, likely due to sedation. No seizures or epileptiform discharges were seen throughout the recording.   Rolfe Hartsell Barbra Sarks

## 2020-12-16 NOTE — Sedation Documentation (Addendum)
Pt appears to be resting and comfortable at this time on fentanyl and precedex gtt. Monitoring at this time and provide sedatives as needed.

## 2020-12-17 ENCOUNTER — Other Ambulatory Visit (HOSPITAL_COMMUNITY): Payer: No Typology Code available for payment source

## 2020-12-17 ENCOUNTER — Encounter (HOSPITAL_COMMUNITY): Payer: No Typology Code available for payment source

## 2020-12-17 DIAGNOSIS — M4126 Other idiopathic scoliosis, lumbar region: Secondary | ICD-10-CM | POA: Diagnosis not present

## 2020-12-17 DIAGNOSIS — I6381 Other cerebral infarction due to occlusion or stenosis of small artery: Secondary | ICD-10-CM | POA: Diagnosis not present

## 2020-12-17 DIAGNOSIS — J9601 Acute respiratory failure with hypoxia: Secondary | ICD-10-CM | POA: Diagnosis not present

## 2020-12-17 DIAGNOSIS — I6330 Cerebral infarction due to thrombosis of unspecified cerebral artery: Secondary | ICD-10-CM | POA: Diagnosis not present

## 2020-12-17 LAB — CBC
HCT: 23.1 % — ABNORMAL LOW (ref 39.0–52.0)
Hemoglobin: 7.8 g/dL — ABNORMAL LOW (ref 13.0–17.0)
MCH: 29 pg (ref 26.0–34.0)
MCHC: 33.8 g/dL (ref 30.0–36.0)
MCV: 85.9 fL (ref 80.0–100.0)
Platelets: 130 10*3/uL — ABNORMAL LOW (ref 150–400)
RBC: 2.69 MIL/uL — ABNORMAL LOW (ref 4.22–5.81)
RDW: 16.2 % — ABNORMAL HIGH (ref 11.5–15.5)
WBC: 9.2 10*3/uL (ref 4.0–10.5)
nRBC: 0.4 % — ABNORMAL HIGH (ref 0.0–0.2)

## 2020-12-17 LAB — POCT I-STAT 7, (LYTES, BLD GAS, ICA,H+H)
Acid-base deficit: 3 mmol/L — ABNORMAL HIGH (ref 0.0–2.0)
Acid-base deficit: 4 mmol/L — ABNORMAL HIGH (ref 0.0–2.0)
Acid-base deficit: 4 mmol/L — ABNORMAL HIGH (ref 0.0–2.0)
Acid-base deficit: 5 mmol/L — ABNORMAL HIGH (ref 0.0–2.0)
Bicarbonate: 22.6 mmol/L (ref 20.0–28.0)
Bicarbonate: 22.9 mmol/L (ref 20.0–28.0)
Bicarbonate: 23.2 mmol/L (ref 20.0–28.0)
Bicarbonate: 23.3 mmol/L (ref 20.0–28.0)
Calcium, Ion: 0.77 mmol/L — CL (ref 1.15–1.40)
Calcium, Ion: 0.8 mmol/L — CL (ref 1.15–1.40)
Calcium, Ion: 0.82 mmol/L — CL (ref 1.15–1.40)
Calcium, Ion: 0.84 mmol/L — CL (ref 1.15–1.40)
HCT: 21 % — ABNORMAL LOW (ref 39.0–52.0)
HCT: 22 % — ABNORMAL LOW (ref 39.0–52.0)
HCT: 22 % — ABNORMAL LOW (ref 39.0–52.0)
HCT: 22 % — ABNORMAL LOW (ref 39.0–52.0)
Hemoglobin: 7.1 g/dL — ABNORMAL LOW (ref 13.0–17.0)
Hemoglobin: 7.5 g/dL — ABNORMAL LOW (ref 13.0–17.0)
Hemoglobin: 7.5 g/dL — ABNORMAL LOW (ref 13.0–17.0)
Hemoglobin: 7.5 g/dL — ABNORMAL LOW (ref 13.0–17.0)
O2 Saturation: 92 %
O2 Saturation: 94 %
O2 Saturation: 96 %
O2 Saturation: 99 %
Patient temperature: 98
Patient temperature: 98.4
Patient temperature: 98.4
Patient temperature: 98.5
Potassium: 5.2 mmol/L — ABNORMAL HIGH (ref 3.5–5.1)
Potassium: 5.3 mmol/L — ABNORMAL HIGH (ref 3.5–5.1)
Potassium: 5.3 mmol/L — ABNORMAL HIGH (ref 3.5–5.1)
Potassium: 5.4 mmol/L — ABNORMAL HIGH (ref 3.5–5.1)
Sodium: 128 mmol/L — ABNORMAL LOW (ref 135–145)
Sodium: 129 mmol/L — ABNORMAL LOW (ref 135–145)
Sodium: 129 mmol/L — ABNORMAL LOW (ref 135–145)
Sodium: 130 mmol/L — ABNORMAL LOW (ref 135–145)
TCO2: 24 mmol/L (ref 22–32)
TCO2: 24 mmol/L (ref 22–32)
TCO2: 25 mmol/L (ref 22–32)
TCO2: 25 mmol/L (ref 22–32)
pCO2 arterial: 44.4 mmHg (ref 32.0–48.0)
pCO2 arterial: 50.1 mmHg — ABNORMAL HIGH (ref 32.0–48.0)
pCO2 arterial: 52.1 mmHg — ABNORMAL HIGH (ref 32.0–48.0)
pCO2 arterial: 52.8 mmHg — ABNORMAL HIGH (ref 32.0–48.0)
pH, Arterial: 7.238 — ABNORMAL LOW (ref 7.350–7.450)
pH, Arterial: 7.254 — ABNORMAL LOW (ref 7.350–7.450)
pH, Arterial: 7.267 — ABNORMAL LOW (ref 7.350–7.450)
pH, Arterial: 7.327 — ABNORMAL LOW (ref 7.350–7.450)
pO2, Arterial: 194 mmHg — ABNORMAL HIGH (ref 83.0–108.0)
pO2, Arterial: 72 mmHg — ABNORMAL LOW (ref 83.0–108.0)
pO2, Arterial: 84 mmHg (ref 83.0–108.0)
pO2, Arterial: 90 mmHg (ref 83.0–108.0)

## 2020-12-17 LAB — RENAL FUNCTION PANEL
Albumin: 1.8 g/dL — ABNORMAL LOW (ref 3.5–5.0)
Albumin: 1.7 g/dL — ABNORMAL LOW (ref 3.5–5.0)
Anion gap: 11 (ref 5–15)
Anion gap: 9 (ref 5–15)
BUN: 48 mg/dL — ABNORMAL HIGH (ref 6–20)
BUN: 40 mg/dL — ABNORMAL HIGH (ref 6–20)
CO2: 20 mmol/L — ABNORMAL LOW (ref 22–32)
CO2: 22 mmol/L (ref 22–32)
Calcium: 7.6 mg/dL — ABNORMAL LOW (ref 8.9–10.3)
Calcium: 7.9 mg/dL — ABNORMAL LOW (ref 8.9–10.3)
Chloride: 99 mmol/L (ref 98–111)
Chloride: 99 mmol/L (ref 98–111)
Creatinine, Ser: 4.91 mg/dL — ABNORMAL HIGH (ref 0.61–1.24)
Creatinine, Ser: 4.12 mg/dL — ABNORMAL HIGH (ref 0.61–1.24)
GFR, Estimated: 13 mL/min — ABNORMAL LOW (ref >60)
GFR, Estimated: 16 mL/min — ABNORMAL LOW (ref >60)
Glucose, Bld: 130 mg/dL — ABNORMAL HIGH (ref 70–99)
Glucose, Bld: 105 mg/dL — ABNORMAL HIGH (ref 70–99)
Phosphorus: 5.7 mg/dL — ABNORMAL HIGH (ref 2.5–4.6)
Phosphorus: 4.5 mg/dL (ref 2.5–4.6)
Potassium: 5.2 mmol/L — ABNORMAL HIGH (ref 3.5–5.1)
Potassium: 4.7 mmol/L (ref 3.5–5.1)
Sodium: 130 mmol/L — ABNORMAL LOW (ref 135–145)
Sodium: 130 mmol/L — ABNORMAL LOW (ref 135–145)

## 2020-12-17 LAB — POCT ACTIVATED CLOTTING TIME
Activated Clotting Time: 161 seconds
Activated Clotting Time: 161 seconds
Activated Clotting Time: 156 seconds
Activated Clotting Time: 161 seconds
Activated Clotting Time: 155 seconds
Activated Clotting Time: 155 seconds
Activated Clotting Time: 161 seconds

## 2020-12-17 LAB — TYPE AND SCREEN
ABO/RH(D): A POS
Antibody Screen: NEGATIVE
Unit division: 0
Unit division: 0
Unit division: 0
Unit division: 0

## 2020-12-17 LAB — BPAM RBC
Blood Product Expiration Date: 202209012359
Blood Product Expiration Date: 202209032359
Blood Product Expiration Date: 202209032359
Blood Product Expiration Date: 202209032359
ISSUE DATE / TIME: 202208041758
ISSUE DATE / TIME: 202208041758
ISSUE DATE / TIME: 202208071109
Unit Type and Rh: 6200
Unit Type and Rh: 6200
Unit Type and Rh: 6200
Unit Type and Rh: 6200

## 2020-12-17 LAB — AEROBIC/ANAEROBIC CULTURE W GRAM STAIN (SURGICAL/DEEP WOUND)
Culture: NO GROWTH
Gram Stain: NONE SEEN

## 2020-12-17 LAB — HEMOGLOBIN A1C
Hgb A1c MFr Bld: 5.8 % — ABNORMAL HIGH (ref 4.8–5.6)
Mean Plasma Glucose: 119.76 mg/dL

## 2020-12-17 LAB — GLUCOSE, CAPILLARY
Glucose-Capillary: 102 mg/dL — ABNORMAL HIGH (ref 70–99)
Glucose-Capillary: 99 mg/dL (ref 70–99)

## 2020-12-17 LAB — MAGNESIUM: Magnesium: 2.5 mg/dL — ABNORMAL HIGH (ref 1.7–2.4)

## 2020-12-17 LAB — TRIGLYCERIDES: Triglycerides: 586 mg/dL — ABNORMAL HIGH (ref <150)

## 2020-12-17 MED ORDER — CALCIUM GLUCONATE-NACL 2-0.675 GM/100ML-% IV SOLN
2.0000 g | Freq: Once | INTRAVENOUS | Status: AC
  Administered 2020-12-17: 2000 mg via INTRAVENOUS
  Filled 2020-12-17: qty 100

## 2020-12-17 MED ORDER — ALBUTEROL SULFATE (2.5 MG/3ML) 0.083% IN NEBU
2.5000 mg | INHALATION_SOLUTION | RESPIRATORY_TRACT | Status: DC | PRN
  Administered 2020-12-24: 2.5 mg via RESPIRATORY_TRACT

## 2020-12-17 MED ORDER — ALBUTEROL SULFATE (2.5 MG/3ML) 0.083% IN NEBU
INHALATION_SOLUTION | RESPIRATORY_TRACT | Status: AC
  Administered 2020-12-17: 2.5 mg
  Filled 2020-12-17: qty 6

## 2020-12-17 MED ORDER — MIDAZOLAM HCL 2 MG/2ML IJ SOLN: 2.0000 mg | Freq: Once | INTRAMUSCULAR | Status: AC

## 2020-12-17 MED ORDER — CALCIUM GLUCONATE-NACL 1-0.675 GM/50ML-% IV SOLN
1.0000 g | Freq: Once | INTRAVENOUS | Status: AC
  Administered 2020-12-17: 1000 mg via INTRAVENOUS
  Filled 2020-12-17: qty 50

## 2020-12-17 MED ORDER — ASPIRIN 81 MG PO CHEW
81.0000 mg | CHEWABLE_TABLET | Freq: Every day | ORAL | Status: DC
  Administered 2020-12-17 – 2020-12-18 (×2): 81 mg
  Filled 2020-12-17 (×2): qty 1

## 2020-12-17 MED ORDER — MIDAZOLAM HCL 2 MG/2ML IJ SOLN
2.0000 mg | INTRAMUSCULAR | Status: DC | PRN
  Administered 2020-12-21 – 2020-12-23 (×7): 2 mg via INTRAVENOUS
  Filled 2020-12-17 (×8): qty 2

## 2020-12-17 MED ORDER — ALBUTEROL SULFATE (2.5 MG/3ML) 0.083% IN NEBU
2.5000 mg | INHALATION_SOLUTION | Freq: Four times a day (QID) | RESPIRATORY_TRACT | Status: DC
  Administered 2020-12-17 – 2020-12-26 (×40): 2.5 mg via RESPIRATORY_TRACT
  Filled 2020-12-17 (×40): qty 3

## 2020-12-17 MED ORDER — MIDAZOLAM HCL 2 MG/2ML IJ SOLN
INTRAMUSCULAR | Status: AC
  Administered 2020-12-17: 2 mg via INTRAVENOUS
  Filled 2020-12-17: qty 2

## 2020-12-17 NOTE — Progress Notes (Signed)
Critical ABG results reported to [place].  7.23-52.8-84-22 94% On 620-20-60%+5

## 2020-12-17 NOTE — Progress Notes (Addendum)
STROKE TEAM PROGRESS NOTE   ATTENDING NOTE: I reviewed above note and agree with the assessment and plan. Pt was seen and examined.   60 year old male with history of PTSD, hypertension, OSA, smoker, seizure on Keppra and Lamictal, bipolar disorder admitted for T10 to pelvis extensive decompression and fusion.  Surgery done on 8/4 and 8/5.  Hospitalization complicated by postprocedure AKI, rhabdomyolysis, right lower extremity DVT and left IJ DVT status post IVC filter.  Patient continued encephalopathy promoted CT head which showed left MCA infarct and left thalamic infarct.  EF 65 to 70%.  Not able to do MRI/MRI due to hardware and on CRRT with multiple lines.  Not able to do CT head and neck due to severe AKI on CRRT.  A1c 5.8, LDL 30.  B12 205, ammonia level 26.  Sodium 130, creatinine 6.74-> 4.91. Hb 7.1-> 7.5.  Platelet 130.  EEG slow waves, no seizure.  On exam, RN at bedside.  Patient intubated on sedation, eyes closed, not following commands. With forced eye opening, eyes in need position, not blinking to visual threat, doll's eyes absent, not tracking, pupil bilaterally 2 mm, sluggish to light. Corneal reflex weakly present bilaterally, gag and cough present. Breathing over the vent.  Facial symmetry not able to test due to ET tube.  Tongue protrusion not cooperative. On pain stimulation, no movement in all extremities. DTR diminished and no babinski. Sensation, coordination and gait not tested.  Etiology for patient stroke not quite clear, given DVT, need to rule out PFO with TCD bubble study. Will do carotid Doppler and TCD also given not able to do MRI or CTA.  Repeat CT in a.m. continue LTM overnight.  Continue on Lamictal and Keppra.  Discussed with Dr. Marcello Moores neurosurgeon, may consider heparin IV tomorrow.  We will follow.  For detailed assessment and plan, please refer to above as I have made changes wherever appropriate.   This patient is critically ill due to respiratory failure,  stroke, extensive spine surgery, AKI on CRRT, severe anemia and at significant risk of neurological worsening, death form recurrent stroke, hemorrhagic conversion, severe anemia, renal failure. This patient's care requires constant monitoring of vital signs, hemodynamics, respiratory and cardiac monitoring, review of multiple databases, neurological assessment, discussion with family, other specialists and medical decision making of high complexity. I spent 40 minutes of neurocritical care time in the care of this patient.  I discussed with Dr. Marcello Moores neurosurgery.  Keith Hawking, MD PhD Stroke Neurology 12/17/2020 7:10 PM    INTERVAL HISTORY No family at bedside. Currently on LTM, intubated, sedated  Vitals:   12/17/20 1100 12/17/20 1118 12/17/20 1200 12/17/20 1243  BP: (!) 133/56  (!) 133/53   Pulse: 97 96 95 99  Resp: (!) 28 (!) 24 (!) 25 (!) 25  Temp:   98.6 F (37 C)   TempSrc:   Axillary   SpO2: 96% 95% 92% (!) 88%  Weight:      Height:       CBC:  Recent Labs  Lab 12/13/20 0740 12/13/20 1513 12/16/20 0509 12/16/20 1042 12/16/20 2149 12/17/20 0500 12/17/20 0536 12/17/20 0810  WBC 13.2*   < > 9.1  --   --  9.2  --   --   NEUTROABS 11.1*  --   --   --   --   --   --   --   HGB 13.4   < > 7.5*  --    < > 7.8* 7.5* 7.5*  HCT  40.8   < > 22.4*  --    < > 23.1* 22.0* 22.0*  MCV 81.0   < > 84.2  --   --  85.9  --   --   PLT 222   < > 72* 82*  --  130*  --   --    < > = values in this interval not displayed.   Basic Metabolic Panel:  Recent Labs  Lab 12/16/20 0509 12/16/20 1623 12/16/20 2149 12/17/20 0500 12/17/20 0536 12/17/20 0810  NA 128* 127*   < > 130* 130* 129*  K 4.6 4.8   < > 5.2* 5.3* 5.3*  CL 98 98  --  99  --   --   CO2 19* 18*  --  20*  --   --   GLUCOSE 124* 114*  --  130*  --   --   BUN 62* 66*  --  48*  --   --   CREATININE 6.63* 6.74*  --  4.91*  --   --   CALCIUM 7.2* 7.0*  --  7.6*  --   --   MG 1.9  --   --  2.5*  --   --   PHOS 5.4* 6.4*  --   5.7*  --   --    < > = values in this interval not displayed.   Lipid Panel:  Recent Labs  Lab 12/16/20 1623 12/17/20 0500  CHOL 89  --   TRIG 235* 586*  HDL 12*  --   CHOLHDL 7.4  --   VLDL 47*  --   LDLCALC 30  --    HgbA1c:  Recent Labs  Lab 12/17/20 0500  HGBA1C 5.8*    IMAGING past 24 hours DG Chest Port 1 View  Result Date: 12/16/2020 CLINICAL DATA:  Ventilator dyssynchrony EXAM: PORTABLE CHEST 1 VIEW COMPARISON:  12/14/2020, 12/13/2020 FINDINGS: Endotracheal tube tip is about 2.8 cm superior to carina. Esophageal tube tip below the diaphragm but incompletely visualized. Right IJ central venous catheter tip over the SVC. Low lung volumes. Normal cardiac size. Interim development of hazy and streaky perihilar opacity with patchy right lower lung airspace disease. No pneumothorax IMPRESSION: 1. Endotracheal tube tip about 2.8 cm superior to carina 2. Low lung volumes. Interim development of streaky and hazy perihilar opacity which may be due to vascular congestion versus atypical infection. There is patchy airspace disease in the right lower lung Electronically Signed   By: Donavan Foil M.D.   On: 12/16/2020 22:33   EEG adult  Result Date: 12/16/2020 Derek Jack, MD     12/16/2020  3:29 PM Routine EEG Report Keith Dixon is a 60 y.o. male with a history of encephalopathy who is undergoing an EEG to evaluate for seizures. Report: This EEG was acquired with electrodes placed according to the International 10-20 electrode system (including Fp1, Fp2, F3, F4, C3, C4, P3, P4, O1, O2, T3, T4, T5, T6, A1, A2, Fz, Cz, Pz). The following electrodes were missing or displaced: none. Best background was composed of frequencies of 4-6 Hz. This activity is reactive to stimulation. No sleep architecture was identified. There was no focal slowing. There were no interictal epileptiform discharges. There were no electrographic seizures identified. Photic stimulation and hyperventilation were not  performed. Impression and clinical correlation: This EEG was obtained while unresponsive and is abnormal due to moderate diffuse slowing indicative of global cerebral dysfunction.   Su Monks, MD [facility] [phone]  If 7pm- 7am, please page neurology on call as listed in [place].   Overnight EEG with video  Result Date: 12/16/2020 Lora Havens, MD     12/17/2020  4:00 AM Patient Name: Keith Dixon MRN: [id number] Epilepsy Attending: Lora Havens Referring Physician/Provider: Claiborne Billings, [place] Duration: 12/16/2020 1436 to 12/17/2020 0400 Patient history:  60 y.o. male with a history of encephalopathy who is undergoing an EEG to evaluate for seizures Level of alertness: lethargic AEDs during EEG study: propofol, LEV, LTG, GBP Technical aspects: This EEG study was done with scalp electrodes positioned according to the 10-20 International system of electrode placement. Electrical activity was acquired at a sampling rate of '500Hz'$  and reviewed with a high frequency filter of '70Hz'$  and a low frequency filter of '1Hz'$ . EEG data were recorded continuously and digitally stored. Description: No posterior dominant rhythm was seen.  EEG initially showed continuous generalized 3 to 6 Hz theta-delta slowing. On 12/16/2020 after around 1830, EEG showed bursts suppression pattern with bursts of 4-'6hz'$  theta-delta slowing lasting 2-3 seconds alternating with eeg suppression lasting 8-10 seconds. Hyperventilation and photic stimulation were not performed.   EKG, artifact, significant myogenic artifact and electrode artifact was seen throughout the study. ABNORMALITY - Continuous slow, generalized - Burst suppression, generalized IMPRESSION: This technically difficult study was initially suggestive of moderate diffuse encephalopathy, nonspecific etiology. After around 1830 on 12/16/2020, eeg was suggestive of profound diffuse encephalopathy, likely due to sedation. No seizures or epileptiform discharges were seen  throughout the recording. [place]    PHYSICAL EXAM Physical Exam  Constitutional: Appears well-developed and well-nourished.  Eyes: Normal external eye and conjunctiva. HENT: Normocephalic, no lesions, without obvious abnormality.   Musculoskeletal-no joint tenderness, deformity or swelling Cardiovascular: Normal rate and regular rhythm.  Respiratory: Effort normal, non-labored breathing saturations WNL GI: Soft.  No distension. There is no tenderness.  Skin: WDI   Neuro:  Mental Status: Patient intubated/sedated Cranial Nerves: + weak corneals bilaterally, face appear symmetric in presence of ETT. Doll's eyes not present. No withdrawal to noxious stimuli.  + cough and Gag.      ASSESSMENT/PLAN Keith Dixon is a 60 y.o. male with PMH significant for PTSD, OSA, HTN, tobacco use, ?seizures, bipolar, degenerativse scoliosis who underwent extensive T10-pelvis decompression and fusion over 2 days(8/4 and 8/5) and difficulty waking up after surgery. Also complicated by Aki, rhabdomyolysis, DVT in RLE and left IJ s/p IVC filter and poor mentation/encephalopathy. Workup with CTH demonstrated embolic appearing strokes and neurology consulted.  Stroke: bilateral cortical infarcts embolic.  CT head Small recent appearing bilateral cortical infarcts, left thalamic and probable bilateral cerebellar infarcts. The multifocality suggests embolic disease MRI  unable d/t condition of patient Carotid Doppler  pending 2D Echo pending LDL 30 HgbA1c 5.8 VTE prophylaxis - SCD's    Diet   Diet NPO time specified   No antithrombotic prior to admission, now on No antithrombotic.  Therapy recommendations:  pending Disposition:  pending  Hypertension Home meds:  norvasc Stable Permissive hypertension (OK if < 220/120) but gradually normalize in 5-7 days Long-term BP goal normotensive  Hyperlipidemia Home meds:  none,  LDL 30, goal < 70  Diabetes type II (no diagnosis) Home meds:   none HgbA1c 5.8, goal < 7.0 CBGs Recent Labs    12/16/20 0737 12/16/20 1133 12/16/20 1538  GLUCAP 122* 124* 104*    Other Stroke Risk Factors Advanced Age >/= 39  Cigarette smoker advised to stop smoking ETOH use, alcohol level <10,  advised to drink no more than 2 drink(s) a day Substance abuse - UDS:  reported THC use Obesity, Body mass index is 30.41 kg/m., BMI >/= 30 associated with increased stroke risk, recommend weight loss, diet and exercise as appropriate    Other Active Problems Hx of Seizures- LTM did not show any seizures or epileptiform discharges. Continue Keppra and Lamictal  AKI:  BUN: 48 creatinine: 4.91 on CRRT  Hyperkalemia: 5.2 Hypocalcemia:7 .6 Rhabdomyolysis: CK: 11,986 CK MB: 37.8  Hospital day # 5  Laurey Morale, MSN, NP-C [facility] [phone]   To contact Stroke Continuity provider, please refer to [email or web address] After hours, contact General Neurology

## 2020-12-17 NOTE — Progress Notes (Signed)
EEG maintenance complete. No skin breakdown at FP1 FP2 A2. Continue to monitor

## 2020-12-17 NOTE — Progress Notes (Signed)
Patient ID: Keith Dixon, male   DOB: 26-Nov-1960, 60 y.o.   MRN: [id number] [facility] Progress Note   Assessment/ Plan:   1.  Acute kidney injury: Appears to be from a combination of ATN and rhabdomyolysis/pigment nephropathy.  He has dense renal injury.  He was started on CRRT on 8/7 for correction of multiple metabolic abnormalities and volume unloading.  Note that he also got IV contrast on 8/7 - Continue CRRT - reduce UF goal to net negative 50 ml/hr as tolerated - updated his significant other at bedside   2.  Hyponatremia: Secondary to free water handling defect in the setting of acute kidney injury, unsuccessful improvement with diuresis, will now monitor with CRRT/volume unloading. Improved with CRRT  3.  Acute postoperative hypoxic/hypercapnic respiratory failure: Intubated and on ventilator support per CCM.  4.  Lumbar spine stenosis/scoliosis status post decompression and fusion surgery: Ongoing management/surveillance by neurosurgery.  5.  Shock: Secondary to distributive/hypovolemic mechanism.  Status post intravenous fluids and now on pressors.  6.  Acute blood loss anemia: Secondary to surgical associated losses, ongoing PRBC transfusion for continued downtrend of hemoglobin and hematocrit.  No ESA for now with acute CVA  7.  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  Subjective:   He had 160 ml uop over 8/7.  He has been on CRRT and had 1.7 liters UF over 8/7 with CRRT.  Goal UF rate of 100-200 ml/hr.  No additional clotting since heparin added through filter.  Dyssynchronous with vent overnight and sedation was increased.  Hypotension during that episode last night but better now.  He is on levo at 34 mcg/min; up from high 20's at start of shift last night per nursing.  Review of systems:  Unable to obtain 2/2 mechanical ventilation    Objective:   BP (!) 121/52   Pulse (!) 101   Temp 98.8 F (37.1 C)  (Axillary)   Resp (!) 9   Ht 6' (1.829 m)   Wt 101.7 kg   SpO2 97%   BMI 30.41 kg/m   Intake/Output Summary (Last 24 hours) at 12/17/2020 0912 Last data filed at 12/17/2020 0900 Gross per 24 hour  Intake 3464.23 ml  Output 2757 ml  Net 707.23 ml   Weight change:   Physical Exam:   General adult male in bed intubated HEENT normocephalic atraumatic EEG monitoring Neck trachea midline Lungs coarse mechanical breath sounds Heart S1S2 no rub Abdomen soft nontender with sedation nondistended Extremities trace edema lower extremities Neuro - continuous sedation running Access: left femoral nontunneled dialysis catheter   Imaging: CT HEAD WO CONTRAST (5MM)  Result Date: 12/16/2020 CLINICAL DATA:  Mental status change EXAM: CT HEAD WITHOUT CONTRAST TECHNIQUE: Contiguous axial images were obtained from the base of the skull through the vertex without intravenous contrast. COMPARISON:  09/11/2020 FINDINGS: Brain: Small acute appearing infarcts seen along the bilateral cerebral cortex, including right frontal, right occipital, and left parietal. Subcortical infarcts may have also occurred and there is also a new left thalamic lacunar infarct. Suspect small bilateral cerebellar infarcts, limitation in the posterior fossa and ventral cerebrum due to streak artifact/motion. No hemorrhage, hydrocephalus, or masslike finding. Vascular: No detected hyperdense vessel. Skull: Negative Sinuses/Orbits: Negative Dermal inclusion cyst along the right temporal scalp. Other: To expedite communication, these results will be called to the ordering clinician or representative/RN by the Radiologist Assistant, and communication documented in the PACS or [employer]. IMPRESSION: Small recent appearing bilateral cortical  infarcts, left thalamic and probable bilateral cerebellar infarcts. The multifocality suggests embolic disease. Electronically Signed   By: [name] M.D.   On: 12/16/2020 10:42   IR IVC  FILTER PLMT / S&I Burke Keels GUID/MOD SED  Result Date: 12/16/2020 CLINICAL DATA:  60 year old male with history of recent extensive spinal fusion surgery and diagnosis of lower extremity deep vein thrombosis, unable to tolerate anticoagulation giving bleeding from surgical site. EXAM: 1. ULTRASOUND GUIDANCE FOR VASCULAR ACCESS OF THE RIGHT internal jugular VEIN. 2. IVC VENOGRAM. 3. PERCUTANEOUS IVC FILTER PLACEMENT. ANESTHESIA/SEDATION: None. CONTRAST:  68m OMNIPAQUE IOHEXOL 300 MG/ML  SOLN FLUOROSCOPY TIME:  24 seconds, 135 mGy PROCEDURE: The procedure, risks, benefits, and alternatives were explained to the patient. Questions regarding the procedure were encouraged and answered. The patient understands and consents to the procedure. The patient was prepped with Betadine in a sterile fashion, and a sterile drape was applied covering the operative field. A sterile gown and sterile gloves were used for the procedure. Local anesthesia was provided with 1% Lidocaine. Under direct ultrasound guidance, a 21 gauge needle was advanced into the right internal jugular vein with ultrasound image documentation performed. After securing access with a micropuncture dilator, a guidewire was advanced into the inferior vena cava. A deployment sheath was advanced over the guidewire. This was utilized to perform IVC venography. The deployment sheath was further positioned in an appropriate location for filter deployment. A Denali IVC filter was then advanced in the sheath. This was then fully deployed in the infrarenal IVC. Final filter position was confirmed with a fluoroscopic spot image. Contrast injection was also performed through the sheath under fluoroscopy to confirm patency of the IVC at the level of the filter. After the procedure the sheath was removed and hemostasis obtained with manual compression. COMPLICATIONS: None. FINDINGS: IVC venography demonstrates a normal caliber IVC with no evidence of thrombus. Renal veins are  identified bilaterally. The IVC filter was successfully positioned below the level of the renal veins and is appropriately oriented. This IVC filter has both permanent and retrievable indications. IMPRESSION: Placement of percutaneous IVC filter in the infrarenal IVC. IVC venogram shows no evidence of IVC thrombus and normal caliber of the inferior vena cava. This filter has both permanent and retrievable indications. PLAN: This IVC filter is potentially retrievable. The patient will be assessed for filter retrieval by Interventional Radiology in approximately 8-12 weeks. Further recommendations regarding filter retrieval, continued surveillance or declaration of device permanence, will be made at that time. DRuthann Cancer MD [facility] Electronically Signed   By: DRuthann CancerMD   On: 12/16/2020 10:27   DG Chest Port 1 View  Result Date: 12/16/2020 CLINICAL DATA:  Ventilator dyssynchrony EXAM: PORTABLE CHEST 1 VIEW COMPARISON:  12/14/2020, 12/13/2020 FINDINGS: Endotracheal tube tip is about 2.8 cm superior to carina. Esophageal tube tip below the diaphragm but incompletely visualized. Right IJ central venous catheter tip over the SVC. Low lung volumes. Normal cardiac size. Interim development of hazy and streaky perihilar opacity with patchy right lower lung airspace disease. No pneumothorax IMPRESSION: 1. Endotracheal tube tip about 2.8 cm superior to carina 2. Low lung volumes. Interim development of streaky and hazy perihilar opacity which may be due to vascular congestion versus atypical infection. There is patchy airspace disease in the right lower lung Electronically Signed   By: KDonavan FoilM.D.   On: 12/16/2020 22:33   EEG adult  Result Date: 12/16/2020 SDerek Jack MD  12/16/2020  3:29 PM Routine EEG Report Keith Dixon is a 60 y.o. male with a history of encephalopathy who is undergoing an EEG to evaluate for seizures. Report:  This EEG was acquired with electrodes placed according to the International 10-20 electrode system (including Fp1, Fp2, F3, F4, C3, C4, P3, P4, O1, O2, T3, T4, T5, T6, A1, A2, Fz, Cz, Pz). The following electrodes were missing or displaced: none. Best background was composed of frequencies of 4-6 Hz. This activity is reactive to stimulation. No sleep architecture was identified. There was no focal slowing. There were no interictal epileptiform discharges. There were no electrographic seizures identified. Photic stimulation and hyperventilation were not performed. Impression and clinical correlation: This EEG was obtained while unresponsive and is abnormal due to moderate diffuse slowing indicative of global cerebral dysfunction.   Su Monks, MD [facility] [phone] If 7pm- 7am, please page neurology on call as listed in [place].   Overnight EEG with video  Result Date: 12/16/2020 Lora Havens, MD     12/17/2020  4:00 AM Patient Name: Keith Dixon MRN: [id number] Epilepsy Attending: Lora Havens Referring Physician/Provider: Claiborne Billings, [place] Duration: 12/16/2020 1436 to 12/17/2020 0400 Patient history:  60 y.o. male with a history of encephalopathy who is undergoing an EEG to evaluate for seizures Level of alertness: lethargic AEDs during EEG study: propofol, LEV, LTG, GBP Technical aspects: This EEG study was done with scalp electrodes positioned according to the 10-20 International system of electrode placement. Electrical activity was acquired at a sampling rate of _0  and reviewed with a high frequency filter of _1  and a low frequency filter of _2 . EEG data were recorded continuously and digitally stored. Description: No posterior dominant rhythm was seen.  EEG initially showed continuous generalized 3 to 6 Hz theta-delta slowing. On 12/16/2020 after around 1830, EEG showed bursts suppression pattern with bursts of 4-_3  theta-delta slowing lasting 2-3 seconds alternating with eeg  suppression lasting 8-10 seconds. Hyperventilation and photic stimulation were not performed.   EKG, artifact, significant myogenic artifact and electrode artifact was seen throughout the study. ABNORMALITY - Continuous slow, generalized - Burst suppression, generalized IMPRESSION: This technically difficult study was initially suggestive of moderate diffuse encephalopathy, nonspecific etiology. After around 1830 on 12/16/2020, eeg was suggestive of profound diffuse encephalopathy, likely due to sedation. No seizures or epileptiform discharges were seen throughout the recording. [place]: BMET Recent [employer] 12/13/20 0740 12/13/20 1513 12/13/20 2008 12/13/20 2234 12/14/20 0458 12/14/20 5361 12/14/20 1745 12/14/20 2154 12/15/20 0459 12/16/20 0509 12/16/20 1623 12/16/20 2149 12/17/20 0018 12/17/20 0248 12/17/20 0500 12/17/20 0536 12/17/20 0810  NA 134*   < > 135   < > 134*   < > 132*   < > 130* 128* 127* 129* 128* 129* 130* 130* 129*  K 3.5   < > 5.1   < > 4.9   < > 4.7   < > 4.4 4.6 4.8 5.4* 5.2* 5.4* 5.2* 5.3* 5.3*  CL 101  --  103  --  101  --  104  --  101 98 98  --   --   --  99  --   --   CO2 22  --   --   --  21*  --  18*  --  19* 19* 18*  --   --   --  20*  --   --   GLUCOSE 116*  --  150*  --  141*  --  152*  --  164* 124* 114*  --   --   --  130*  --   --   BUN 10  --  17  --  23*  --  32*  --  44* 62* 66*  --   --   --  48*  --   --   CREATININE 1.05  --  1.50*  --  2.61*  --  4.05*  --  4.91* 6.63* 6.74*  --   --   --  4.91*  --   --   CALCIUM 9.5  --   --   --  7.7*  --  7.5*  --  7.1* 7.2* 7.0*  --   --   --  7.6*  --   --   PHOS  --   --   --   --  6.5*  --  6.3*  --  5.5* 5.4* 6.4*  --   --   --  5.7*  --   --    < > = values in this interval not displayed.   CBC Recent Labs  Lab 12/13/20 0740 12/13/20 1513 12/14/20 1745 12/14/20 2154 12/15/20 0459 12/16/20 0509 12/16/20 1042 12/16/20 2149 12/17/20 0248 12/17/20 0500 12/17/20 0536  12/17/20 0810  WBC 13.2*   < > 15.4*  --  14.0* 9.1  --   --   --  9.2  --   --   NEUTROABS 11.1*  --   --   --   --   --   --   --   --   --   --   --   HGB 13.4   < > 10.4*   < > 8.9* 7.5*  --    < > 7.5* 7.8* 7.5* 7.5*  HCT 40.8   < > 30.5*   < > 26.6* 22.4*  --    < > 22.0* 23.1* 22.0* 22.0*  MCV 81.0   < > 82.9  --  84.4 84.2  --   --   --  85.9  --   --   PLT 222   < > ACLMP  --  PLATELET CLUMPS NOTED ON SMEAR, UNABLE TO ESTIMATE 72* 82*  --   --  130*  --   --    < > = values in this interval not displayed.   Medications:     sodium chloride   Intravenous Once   albuterol  2.5 mg Nebulization Q6H   artificial tears  1 application Both Eyes F8H   chlorhexidine gluconate (MEDLINE KIT)  15 mL Mouth Rinse BID   Chlorhexidine Gluconate Cloth  6 each Topical Daily   cholecalciferol  2,000 Units Per Tube BID AC & HS   cyanocobalamin  1,000 mcg Intramuscular Daily   Followed by   Derrill Memo ON 12/23/2020] vitamin B-12  1,000 mcg Oral Daily   docusate  100 mg Per Tube BID   feeding supplement (PIVOT 1.5 CAL)  1,000 mL Per Tube W29H   folic acid  1 mg Intravenous Daily   gabapentin  300 mg Per Tube Q12H   lamoTRIgine  100 mg Per Tube BID   levETIRAcetam  1,000 mg Per Tube BID   mouth rinse  15 mL Mouth Rinse 10 times per day   pantoprazole sodium  40 mg Per Tube QHS   QUEtiapine  200 mg Per Tube QHS   sertraline  100 mg Per Tube  QPC breakfast   sodium chloride flush  3 mL Intravenous Q12H   [START ON 12/25/2020] thiamine injection  100 mg Intravenous Daily   Claudia Desanctis, MD 12/17/2020, 9:30 AM

## 2020-12-17 NOTE — Progress Notes (Signed)
LTM maint complete - no skin breakdown under: Fp2 F8 A2 Maintenance T8 [facility] monitored, Event button test confirmed by [facility].

## 2020-12-17 NOTE — Progress Notes (Signed)
VASCULAR LAB    Unable to complete exam at this time due to ongoing treatments.  Dr. Erlinda Hong is aware.  Current vascular orders on hold until patient can be accessed.     Tharun Cappella, RVT 12/17/2020, 2:58 PM

## 2020-12-17 NOTE — Progress Notes (Signed)
ABG results given to MD  PH 7.32 PCO2 44.5 PO2 91 HCO3 23.3 96%

## 2020-12-17 NOTE — Progress Notes (Signed)
ABG results reported to Elink  pH 7.26 CO2 50.1 PO2 72 HCO3 22 92%

## 2020-12-17 NOTE — Progress Notes (Signed)
Subjective: NAEs o/n  Objective: Vital signs in last 24 hours: Temp:  [97.4 F (36.3 C)-100 F (37.8 C)] 98.8 F (37.1 C) (08/08 0800) Pulse Rate:  [86-132] 101 (08/08 0900) Resp:  [5-28] 9 (08/08 0900) BP: (88-163)/(47-104) 121/52 (08/08 0900) SpO2:  [83 %-100 %] 97 % (08/08 0900) Arterial Line BP: (93-186)/(38-79) 139/49 (08/08 0900) FiO2 (%):  [40 %-100 %] 60 % (08/08 0743)  Intake/Output from previous day: 08/07 0701 - 08/08 0700 In: 3855.1 [P.O.:60; I.V.:2475.1; Blood:630; NG/GT:390; IV Piggyback:300] Out: 2318 [Urine:160; Drains:455] Intake/Output this shift: Total I/O In: 535.9 [I.V.:330.9; Other:50; NG/GT:155] Out: [id number] [Other:669]  Sedated on propofol On Levophed CRRT ongoing Right leg with mild upper thigh swelling Moves legs slightly to pain, exam limited by sedation Drain output is almost entirely serous  Lab Results: Recent Labs    12/16/20 0509 12/16/20 1042 12/16/20 2149 12/17/20 0500 12/17/20 0536 12/17/20 0810  WBC 9.1  --   --  9.2  --   --   HGB 7.5*  --    < > 7.8* 7.5* 7.5*  HCT 22.4*  --    < > 23.1* 22.0* 22.0*  PLT 72* 82*  --  130*  --   --    < > = values in this interval not displayed.   BMET Recent Labs    12/16/20 1623 12/16/20 2149 12/17/20 0500 12/17/20 0536 12/17/20 0810  Keith Dixon 127*   < > 130* 130* 129*  K 4.8   < > 5.2* 5.3* 5.3*  CL 98  --  99  --   --   CO2 18*  --  20*  --   --   GLUCOSE 114*  --  130*  --   --   BUN 66*  --  48*  --   --   CREATININE 6.74*  --  4.91*  --   --   CALCIUM 7.0*  --  7.6*  --   --    < > = values in this interval not displayed.    Studies/Results: CT HEAD WO CONTRAST (5MM)  Result Date: 12/16/2020 CLINICAL DATA:  Mental status change EXAM: CT HEAD WITHOUT CONTRAST TECHNIQUE: Contiguous axial images were obtained from the base of the skull through the vertex without intravenous contrast. COMPARISON:  09/11/2020 FINDINGS: Brain: Small acute appearing infarcts seen along the bilateral  cerebral cortex, including right frontal, right occipital, and left parietal. Subcortical infarcts may have also occurred and there is also a new left thalamic lacunar infarct. Suspect small bilateral cerebellar infarcts, limitation in the posterior fossa and ventral cerebrum due to streak artifact/motion. No hemorrhage, hydrocephalus, or masslike finding. Vascular: No detected hyperdense vessel. Skull: Negative Sinuses/Orbits: Negative Dermal inclusion cyst along the right temporal scalp. Other: To expedite communication, these results will be called to the ordering clinician or representative/RN by the Radiologist Assistant, and communication documented in the PACS or [employer]. IMPRESSION: Small recent appearing bilateral cortical infarcts, left thalamic and probable bilateral cerebellar infarcts. The multifocality suggests embolic disease. Electronically Signed   By: [name] M.D.   On: 12/16/2020 10:42   IR IVC FILTER PLMT / S&I Burke Keels GUID/MOD SED  Result Date: 12/16/2020 CLINICAL DATA:  60 year old male with history of recent extensive spinal fusion surgery and diagnosis of lower extremity deep vein thrombosis, unable to tolerate anticoagulation giving bleeding from surgical site. EXAM: 1. ULTRASOUND GUIDANCE FOR VASCULAR ACCESS OF THE RIGHT internal jugular VEIN. 2. IVC VENOGRAM. 3. PERCUTANEOUS IVC FILTER PLACEMENT. ANESTHESIA/SEDATION: None.  CONTRAST:  68m OMNIPAQUE IOHEXOL 300 MG/ML  SOLN FLUOROSCOPY TIME:  24 seconds, 135 mGy PROCEDURE: The procedure, risks, benefits, and alternatives were explained to the patient. Questions regarding the procedure were encouraged and answered. The patient understands and consents to the procedure. The patient was prepped with Betadine in a sterile fashion, and a sterile drape was applied covering the operative field. A sterile gown and sterile gloves were used for the procedure. Local anesthesia was provided with 1% Lidocaine. Under direct ultrasound  guidance, a 21 gauge needle was advanced into the right internal jugular vein with ultrasound image documentation performed. After securing access with a micropuncture dilator, a guidewire was advanced into the inferior vena cava. A deployment sheath was advanced over the guidewire. This was utilized to perform IVC venography. The deployment sheath was further positioned in an appropriate location for filter deployment. A Denali IVC filter was then advanced in the sheath. This was then fully deployed in the infrarenal IVC. Final filter position was confirmed with a fluoroscopic spot image. Contrast injection was also performed through the sheath under fluoroscopy to confirm patency of the IVC at the level of the filter. After the procedure the sheath was removed and hemostasis obtained with manual compression. COMPLICATIONS: None. FINDINGS: IVC venography demonstrates a normal caliber IVC with no evidence of thrombus. Renal veins are identified bilaterally. The IVC filter was successfully positioned below the level of the renal veins and is appropriately oriented. This IVC filter has both permanent and retrievable indications. IMPRESSION: Placement of percutaneous IVC filter in the infrarenal IVC. IVC venogram shows no evidence of IVC thrombus and normal caliber of the inferior vena cava. This filter has both permanent and retrievable indications. PLAN: This IVC filter is potentially retrievable. The patient will be assessed for filter retrieval by Interventional Radiology in approximately 8-12 weeks. Further recommendations regarding filter retrieval, continued surveillance or declaration of device permanence, will be made at that time. DRuthann Cancer MD [facility] Electronically Signed   By: DRuthann CancerMD   On: 12/16/2020 10:27   DG Chest Port 1 View  Result Date: 12/16/2020 CLINICAL DATA:  Ventilator dyssynchrony EXAM: PORTABLE CHEST 1 VIEW  COMPARISON:  12/14/2020, 12/13/2020 FINDINGS: Endotracheal tube tip is about 2.8 cm superior to carina. Esophageal tube tip below the diaphragm but incompletely visualized. Right IJ central venous catheter tip over the SVC. Low lung volumes. Normal cardiac size. Interim development of hazy and streaky perihilar opacity with patchy right lower lung airspace disease. No pneumothorax IMPRESSION: 1. Endotracheal tube tip about 2.8 cm superior to carina 2. Low lung volumes. Interim development of streaky and hazy perihilar opacity which may be due to vascular congestion versus atypical infection. There is patchy airspace disease in the right lower lung Electronically Signed   By: KDonavan FoilM.D.   On: 12/16/2020 22:33   EEG adult  Result Date: 12/16/2020 SDerek Jack MD     12/16/2020  3:29 PM Routine EEG Report MJADELL VELTRIis a 60y.o. male with a history of encephalopathy who is undergoing an EEG to evaluate for seizures. Report: This EEG was acquired with electrodes placed according to the International 10-20 electrode system (including Fp1, Fp2, F3, F4, C3, C4, P3, P4, O1, O2, T3, T4, T5, T6, A1, A2, Fz, Cz, Pz). The following electrodes were missing or displaced: none. Best background was composed of frequencies of 4-6 Hz. This activity is reactive to stimulation. No sleep architecture was identified. There  was no focal slowing. There were no interictal epileptiform discharges. There were no electrographic seizures identified. Photic stimulation and hyperventilation were not performed. Impression and clinical correlation: This EEG was obtained while unresponsive and is abnormal due to moderate diffuse slowing indicative of global cerebral dysfunction.   Su Monks, MD [facility] [phone] If 7pm- 7am, please page neurology on call as listed in [place].   Overnight EEG with video  Result Date: 12/16/2020 Lora Havens, MD     12/17/2020  4:00 AM Patient Name: PEPPER ERTMAN MRN:  [id number] Epilepsy Attending: Lora Havens Referring Physician/Provider: Claiborne Billings, [place] Duration: 12/16/2020 1436 to 12/17/2020 0400 Patient history:  60 y.o. male with a history of encephalopathy who is undergoing an EEG to evaluate for seizures Level of alertness: lethargic AEDs during EEG study: propofol, LEV, LTG, GBP Technical aspects: This EEG study was done with scalp electrodes positioned according to the 10-20 International system of electrode placement. Electrical activity was acquired at a sampling rate of '500Hz'$  and reviewed with a high frequency filter of '70Hz'$  and a low frequency filter of '1Hz'$ . EEG data were recorded continuously and digitally stored. Description: No posterior dominant rhythm was seen.  EEG initially showed continuous generalized 3 to 6 Hz theta-delta slowing. On 12/16/2020 after around 1830, EEG showed bursts suppression pattern with bursts of 4-'6hz'$  theta-delta slowing lasting 2-3 seconds alternating with eeg suppression lasting 8-10 seconds. Hyperventilation and photic stimulation were not performed.   EKG, artifact, significant myogenic artifact and electrode artifact was seen throughout the study. ABNORMALITY - Continuous slow, generalized - Burst suppression, generalized IMPRESSION: This technically difficult study was initially suggestive of moderate diffuse encephalopathy, nonspecific etiology. After around 1830 on 12/16/2020, eeg was suggestive of profound diffuse encephalopathy, likely due to sedation. No seizures or epileptiform discharges were seen throughout the recording. Lora Havens    Assessment/Plan: 60 yo M s/p T10-pelvis fusion with decompression, osteotomies, L1-S1 interbodies for deformity and severe spinal stenosis. - on 8/9, will plan to remove drains and start heparin gtt, with narrowed PTT goal 50-70; will need more frequent serial neurologic exams at that point - continue supportive critical care  Vallarie Mare 12/17/2020, 10:44 AM

## 2020-12-17 NOTE — Progress Notes (Signed)
NAME:  Keith Dixon, MRN:  [id number], DOB:  [birth year], LOS: 5 ADMISSION DATE:  12/12/2020, CONSULTATION DATE:  12/17/20  REFERRING MD:  Grandville Silos, CHIEF COMPLAINT:   respiratory depression  History of Present Illness:  Keith Dixon is a 60 y/o M with PMH significant for severe degenerative scoliosis with multi-level listhesis, anxiety, bipolar disorder, PTSD, seizures, OSA and tobacco use who was admitted to neurosurgery for L1-2, L2-3, L3-4, L4-5 DLIFs initially and subsequent T10-pelvis decompression and fusion.   His initial surgery was 8/3 and was slow to wake up, after second  surgery 8/4  (10hr procedure) pt was again slow to awaken post-anesthesia, so decision was made to leave patient intubated overnight.  He was transfused two units PRBC intra-operatively.   Significant Hospital Events:  8/3 Admit to neurosurgery, underwent  anterior lateral Interbody fusion of L1-L5, notably had difficult foley insertion requiring urethral dilation 8/4 Stage 2 posterior decompression and fusion, left intubated post-procedure and PCCM consulted 8/5 AKI seen on am labs with evidence of volume depletion on POC [place] 8/6: Patient AKI was due to acute rhabdomyolysis in the setting of prolonged surgery, he remained oliguric.  Ultrasound lower extremity showed acute right lower extremity DVT and left IJ DVT 8/7: Patient serum creatinine continue to trend up, he remained oliguric, started on CRRT Head CT showed small multiple bilateral strokes.  EEG was placed  Interim History / Subjective:  Patient was started on LTM yesterday, which showed no seizures He had an episode of dyssynchronous with the ventilator, ABG suggestive of acute respiratory acidosis and hypoxemia, vent setting was adjusted, patient required deep sedation and paralytic for vent dyssynchrony  Objective   Blood pressure (!) 121/52, pulse (!) 101, temperature 98.8 F (37.1 C), temperature source Axillary, resp. rate (!) 9, height 6' (1.829  m), weight 101.7 kg, SpO2 97 %.    Vent Mode: PRVC FiO2 (%):  [40 %-100 %] 60 % Set Rate:  [18 bmp-28 bmp] 28 bmp Vt Set:  [620 mL] 620 mL PEEP:  [5 cmH20-6 cmH20] 6 cmH20 Pressure Support:  [5 cmH20] 5 cmH20 Plateau Pressure:  [17 cmH20-22 cmH20] 22 cmH20   Intake/Output Summary (Last 24 hours) at 12/17/2020 1058 Last data filed at 12/17/2020 1009 Gross per 24 hour  Intake 3651.93 ml  Output 2987 ml  Net 664.93 ml   Filed Weights   12/12/20 0718 12/15/20 0500 12/16/20 0500  Weight: 108.9 kg 104.9 kg 101.7 kg   Physical Exam  General: Critically ill-appearing middle-aged African-American male, lying in the bed, orally intubated  HEENT: Atraumatic, normocephalic ETT, MM pink/moist, PERRL,  Neuro: Deeply sedated, does not open eyes, not following commands.  Pupils pinpoint, minimally reactive CV: s1s2 regular rate and rhythm, no murmur, rubs, or gallops,  PULM:  Diminished bilaterally, no added breath sounds, no increased work of breathing,  GI: soft, bowel sounds active in all 4 quadrants, non-tender, non-distended Extremities: warm/dry, no edema  Skin: no rashes or lesions  Resolved Hospital Problem list     Assessment & Plan:  Acute respiratory failure with hypoxia and hypercapnia due to post-op respiratory depression Acute respiratory acidosis Continue ventilator support with lung protective strategies  Patient became dyssynchronous with the vent yesterday, requiring deep sedation and 1 dose of paralytic Vent settings were adjusted with improvement in respiratory acidosis Head of bed elevated 30 degrees. Peak, plateau and driving pressures are at goal Ensure adequate pulmonary hygiene  VAP bundle in place  PAD protocol Continue sedation with RASS goal -2--3  Hypertriglyceridemia due to propofol infusion Patient's triglyceride levels are elevated > 500 Will slowly decrease propofol by 5 mg/h Closely monitor serum triglyceride  Acute rhabdomyolysis in the setting of  prolonged surgery and hypotension due to blood loss during surgery Oliguric acute renal failure due to rhabdomyolysis and ischemic ATN due to hypotension during surgery Nephrology input is appreciated Patient was started on CRRT Avoid nephrotoxins Ensure adequate renal perfusion   Acute bilateral multiple strokes CT head was done which showed small bilateral multiple strokes Appreciate neurology recommendation TSH B12 and folate level are within normal limits Cannot get MRI due to hardware in place  Lumbar spinal stenosis status post decompression and fusion surgery Drain in place with improved output Closely monitor drain output Monitor H&H Neurosurgery is following, defer management to primary team  Hypotension due to deep sedation and volume depletion  Patient is requiring IV vasopressors to maintain map goal of 65 Continue to titrate vasopressors Home antihypertensives on hold  Acute right lower extremity DVT, acute left IJ DVT Initially patient was started on IV heparin infusion Neurosurgery recommend stopping IV heparin considering high drain output IVC filter was placed this morning Will see when it is safe to start IV heparin infusion from neurosurgical standpoint  Acute metabolic encephalopathy Seizures disorder Hx of Bipolar 1 disorder  Continue home Zoloft, Seroquel but reduces Baclofen and gabapentin to avoid withdrawal PRN Flexeril, Oxy, and Dilaudid remain but will not be used while intubated  Maintain neuro protective measures; goal for eurothermia, euglycemia, eunatermia, normoxia, and PCO2 goal of 35-40 Nutrition and bowel regiment  Seizure precautions  Continue home Keprra  Aspirations precautions  EEG is negative for active seizures  Tobacco dependence Smoking cessation counseling, once extubated  Hypocalcemia/hyponatremia/hyperphosphatemia Closely monitor electrolytes and supplement   Best Practice    Diet/type: NPO DVT prophylaxis: SCD GI  prophylaxis: PPI Lines: Yes, still needed Foley: Yes, still needed Code Status:  full code Last date of multidisciplinary goals of care discussion: 8/8 patient's family was updated at bedside  Critical care time:   Total critical care time: 46 minutes  Performed by: Jacky Kindle   Critical care time was exclusive of separately billable procedures and treating other patients.   Critical care was necessary to treat or prevent imminent or life-threatening deterioration.   Critical care was time spent personally by me on the following activities: development of treatment plan with patient and/or surrogate as well as nursing, discussions with consultants, evaluation of patient's response to treatment, examination of patient, obtaining history from patient or surrogate, ordering and performing treatments and interventions, ordering and review of laboratory studies, ordering and review of radiographic studies, pulse oximetry and re-evaluation of patient's condition.   Jacky Kindle MD [facility] See Amion for pager If no response to pager, please call [phone] until 7pm After 7pm, Please call E-link [phone]

## 2020-12-18 ENCOUNTER — Inpatient Hospital Stay (HOSPITAL_COMMUNITY): Payer: No Typology Code available for payment source

## 2020-12-18 DIAGNOSIS — Z978 Presence of other specified devices: Secondary | ICD-10-CM

## 2020-12-18 DIAGNOSIS — Z95828 Presence of other vascular implants and grafts: Secondary | ICD-10-CM | POA: Diagnosis not present

## 2020-12-18 DIAGNOSIS — R4182 Altered mental status, unspecified: Secondary | ICD-10-CM

## 2020-12-18 DIAGNOSIS — J95859 Other complication of respirator [ventilator]: Secondary | ICD-10-CM

## 2020-12-18 DIAGNOSIS — R569 Unspecified convulsions: Secondary | ICD-10-CM

## 2020-12-18 LAB — RENAL FUNCTION PANEL
Albumin: 1.6 g/dL — ABNORMAL LOW (ref 3.5–5.0)
Albumin: 1.6 g/dL — ABNORMAL LOW (ref 3.5–5.0)
Anion gap: 10 (ref 5–15)
Anion gap: 7 (ref 5–15)
BUN: 33 mg/dL — ABNORMAL HIGH (ref 6–20)
BUN: 32 mg/dL — ABNORMAL HIGH (ref 6–20)
CO2: 23 mmol/L (ref 22–32)
CO2: 24 mmol/L (ref 22–32)
Calcium: 7.8 mg/dL — ABNORMAL LOW (ref 8.9–10.3)
Calcium: 7.8 mg/dL — ABNORMAL LOW (ref 8.9–10.3)
Chloride: 98 mmol/L (ref 98–111)
Chloride: 102 mmol/L (ref 98–111)
Creatinine, Ser: 3.37 mg/dL — ABNORMAL HIGH (ref 0.61–1.24)
Creatinine, Ser: 3.13 mg/dL — ABNORMAL HIGH (ref 0.61–1.24)
GFR, Estimated: 20 mL/min — ABNORMAL LOW (ref >60)
GFR, Estimated: 22 mL/min — ABNORMAL LOW (ref >60)
Glucose, Bld: 145 mg/dL — ABNORMAL HIGH (ref 70–99)
Glucose, Bld: 127 mg/dL — ABNORMAL HIGH (ref 70–99)
Phosphorus: 3.5 mg/dL (ref 2.5–4.6)
Phosphorus: 3.3 mg/dL (ref 2.5–4.6)
Potassium: 4.3 mmol/L (ref 3.5–5.1)
Potassium: 4.4 mmol/L (ref 3.5–5.1)
Sodium: 131 mmol/L — ABNORMAL LOW (ref 135–145)
Sodium: 133 mmol/L — ABNORMAL LOW (ref 135–145)

## 2020-12-18 LAB — CBC
HCT: 21 % — ABNORMAL LOW (ref 39.0–52.0)
HCT: 22 % — ABNORMAL LOW (ref 39.0–52.0)
Hemoglobin: 7 g/dL — ABNORMAL LOW (ref 13.0–17.0)
Hemoglobin: 7.3 g/dL — ABNORMAL LOW (ref 13.0–17.0)
MCH: 27.9 pg (ref 26.0–34.0)
MCH: 27.4 pg (ref 26.0–34.0)
MCHC: 33.3 g/dL (ref 30.0–36.0)
MCHC: 33.2 g/dL (ref 30.0–36.0)
MCV: 83.7 fL (ref 80.0–100.0)
MCV: 82.7 fL (ref 80.0–100.0)
Platelets: 109 10*3/uL — ABNORMAL LOW (ref 150–400)
Platelets: 128 10*3/uL — ABNORMAL LOW (ref 150–400)
RBC: 2.51 MIL/uL — ABNORMAL LOW (ref 4.22–5.81)
RBC: 2.66 MIL/uL — ABNORMAL LOW (ref 4.22–5.81)
RDW: 16 % — ABNORMAL HIGH (ref 11.5–15.5)
RDW: 16.6 % — ABNORMAL HIGH (ref 11.5–15.5)
WBC: 6.5 10*3/uL (ref 4.0–10.5)
WBC: 7.5 10*3/uL (ref 4.0–10.5)
nRBC: 0.8 % — ABNORMAL HIGH (ref 0.0–0.2)
nRBC: 0 % (ref 0.0–0.2)

## 2020-12-18 LAB — GLUCOSE, CAPILLARY
Glucose-Capillary: 108 mg/dL — ABNORMAL HIGH (ref 70–99)
Glucose-Capillary: 109 mg/dL — ABNORMAL HIGH (ref 70–99)
Glucose-Capillary: 109 mg/dL — ABNORMAL HIGH (ref 70–99)
Glucose-Capillary: 112 mg/dL — ABNORMAL HIGH (ref 70–99)
Glucose-Capillary: 118 mg/dL — ABNORMAL HIGH (ref 70–99)
Glucose-Capillary: 121 mg/dL — ABNORMAL HIGH (ref 70–99)
Glucose-Capillary: 129 mg/dL — ABNORMAL HIGH (ref 70–99)
Glucose-Capillary: 133 mg/dL — ABNORMAL HIGH (ref 70–99)
Glucose-Capillary: 135 mg/dL — ABNORMAL HIGH (ref 70–99)
Glucose-Capillary: 136 mg/dL — ABNORMAL HIGH (ref 70–99)
Glucose-Capillary: 85 mg/dL (ref 70–99)
Glucose-Capillary: 88 mg/dL (ref 70–99)

## 2020-12-18 LAB — URINALYSIS, ROUTINE W REFLEX MICROSCOPIC
Bilirubin Urine: NEGATIVE
Glucose, UA: NEGATIVE mg/dL
Ketones, ur: NEGATIVE mg/dL
Nitrite: NEGATIVE
Protein, ur: 100 mg/dL — AB
RBC / HPF: >50 RBC/hpf — ABNORMAL HIGH (ref 0–5)
Specific Gravity, Urine: 1.017 (ref 1.005–1.030)
WBC, UA: >50 WBC/hpf — ABNORMAL HIGH (ref 0–5)
pH: 5 (ref 5.0–8.0)

## 2020-12-18 LAB — APTT
aPTT: 31 seconds (ref 24–36)
aPTT: 38 seconds — ABNORMAL HIGH (ref 24–36)

## 2020-12-18 LAB — POCT ACTIVATED CLOTTING TIME
Activated Clotting Time: 155 seconds
Activated Clotting Time: 161 seconds

## 2020-12-18 LAB — MAGNESIUM: Magnesium: 2.7 mg/dL — ABNORMAL HIGH (ref 1.7–2.4)

## 2020-12-18 LAB — PREPARE RBC (CROSSMATCH)

## 2020-12-18 MED ORDER — POLYETHYLENE GLYCOL 3350 17 G PO PACK
17.0000 g | PACK | Freq: Every day | ORAL | Status: DC
  Administered 2020-12-18 – 2020-12-30 (×7): 17 g
  Filled 2020-12-18 (×10): qty 1

## 2020-12-18 MED ORDER — PROSOURCE TF PO LIQD
90.0000 mL | Freq: Two times a day (BID) | ORAL | Status: DC
  Administered 2020-12-18 – 2020-12-21 (×6): 90 mL
  Filled 2020-12-18 (×6): qty 90

## 2020-12-18 MED ORDER — SENNOSIDES 8.8 MG/5ML PO SYRP
5.0000 mL | ORAL_SOLUTION | Freq: Every evening | ORAL | Status: DC | PRN
  Administered 2020-12-18 – 2021-01-07 (×3): 5 mL
  Filled 2020-12-18 (×4): qty 5

## 2020-12-18 MED ORDER — PROPOFOL 1000 MG/100ML IV EMUL
INTRAVENOUS | Status: AC
  Filled 2020-12-18: qty 400

## 2020-12-18 MED ORDER — SENNOSIDES 8.8 MG/5ML PO SYRP
10.0000 mL | ORAL_SOLUTION | Freq: Once | ORAL | Status: AC
  Administered 2020-12-18: 10 mL
  Filled 2020-12-18: qty 10

## 2020-12-18 MED ORDER — PIVOT 1.5 CAL PO LIQD
1000.0000 mL | ORAL | Status: DC
  Administered 2020-12-19 – 2020-12-21 (×3): 1000 mL

## 2020-12-18 MED ORDER — GUAIFENESIN 100 MG/5ML PO SOLN
10.0000 mL | Freq: Two times a day (BID) | ORAL | Status: AC
  Administered 2020-12-18 – 2020-12-22 (×10): 200 mg
  Filled 2020-12-18 (×10): qty 15

## 2020-12-18 MED ORDER — SODIUM CHLORIDE 0.9% IV SOLUTION: Freq: Once | INTRAVENOUS | Status: AC

## 2020-12-18 MED ORDER — PHENYLEPHRINE HCL-NACL 20-0.9 MG/250ML-% IV SOLN
INTRAVENOUS | Status: AC
  Filled 2020-12-18: qty 500

## 2020-12-18 MED ORDER — HEPARIN (PORCINE) 25000 UT/250ML-% IV SOLN
1800.0000 [IU]/h | INTRAVENOUS | Status: DC
  Administered 2020-12-18: 800 [IU]/h via INTRAVENOUS
  Administered 2020-12-19: 1200 [IU]/h via INTRAVENOUS
  Administered 2020-12-20: 1500 [IU]/h via INTRAVENOUS
  Administered 2020-12-21: 1800 [IU]/h via INTRAVENOUS
  Filled 2020-12-18 (×4): qty 250

## 2020-12-18 NOTE — Progress Notes (Signed)
NAME:  EWARD HELOU, MRN:  [id number], DOB:  [birth year], LOS: 29 ADMISSION DATE:  12/12/2020, CONSULTATION DATE:  12/18/20  REFERRING MD:  Grandville Silos, CHIEF COMPLAINT:   respiratory depression  History of Present Illness:  Keith Dixon is a 60 y/o M with PMH significant for severe degenerative scoliosis with multi-level listhesis, anxiety, bipolar disorder, PTSD, seizures, OSA and tobacco use who was admitted to neurosurgery for L1-2, L2-3, L3-4, L4-5 DLIFs initially and subsequent T10-pelvis decompression and fusion.   His initial surgery was 8/3 and was slow to wake up, after second  surgery 8/4  (10hr procedure) pt was again slow to awaken post-anesthesia, so decision was made to leave patient intubated overnight.  He was transfused two units PRBC intra-operatively.   Significant Hospital Events:  8/3 Admit to neurosurgery, underwent  anterior lateral Interbody fusion of L1-L5, notably had difficult foley insertion requiring urethral dilation 8/4 Stage 2 posterior decompression and fusion, left intubated post-procedure and PCCM consulted 8/5 AKI seen on am labs with evidence of volume depletion on POC [place] 8/6: Patient AKI was due to acute rhabdomyolysis in the setting of prolonged surgery, he remained oliguric.  Ultrasound lower extremity showed acute right lower extremity DVT and left IJ DVT, IVC filter placed 8/7: Patient serum creatinine continues to trend up, he remained oliguric, started on CRRT Head CT showed small multiple bilateral strokes.  EEG was placed  8/9>. No seizures per EEG, remains on Levo, Propofol and fentanyl with good vent synchrony. Remains on CVVH with UF goal of 50 cc per hour, but increasing pressor needs this am   Interim History / Subjective:  Patient was started on LTM 8/7 , which showed no seizures He had an episode of dyssynchronous with the ventilator, ABG suggestive of acute respiratory acidosis and hypoxemia, vent setting was adjusted, patient required deep  sedation and paralytic for vent dyssynchrony. He is now on 40 propofol and 250 fentanyl with good vent synchrony. Thick purulent secretions per ETT.  Net + 13,742 cc's since admission , hourly goal for fluid removal is 50 cc per hour.  Anuric, but net negative 794 last 24 hours ( 3425 per CVVHD)   Objective   Blood pressure (!) 131/53, pulse (!) 104, temperature 98.8 F (37.1 C), temperature source Axillary, resp. rate 14, height 6' (1.829 m), weight 115.9 kg, SpO2 96 %.    Vent Mode: PRVC FiO2 (%):  [40 %-60 %] 40 % Set Rate:  [28 bmp] 28 bmp Vt Set:  [620 mL] 620 mL PEEP:  [5 cmH20] 5 cmH20 Plateau Pressure:  [20 cmH20-25 cmH20] 20 cmH20   Intake/Output Summary (Last 24 hours) at 12/18/2020 0949 Last data filed at 12/18/2020 0900 Gross per 24 hour  Intake 3192.09 ml  Output 3739 ml  Net -546.91 ml   Filed Weights   12/15/20 0500 12/16/20 0500 12/18/20 0900  Weight: 104.9 kg 101.7 kg 115.9 kg   Physical Exam  General: Critically ill-appearing middle-aged African-American male, lying in the bed, orally intubated  HEENT: Atraumatic, normocephalic ETT, MM pink/moist, PERRL,  Neuro: Deeply sedated, does not open eyes, not following commands.  Pupils pinpoint, minimally reactive CV: s1s2 regular rate and rhythm, no murmur, rubs, or gallops,  PULM:  Bilateral chest excursion, Diminished bilaterally, no added breath sounds, no increased work of breathing,  GI: soft, bowel sounds active in all 4 quadrants, non-tender, non-distended Extremities: warm/dry, no edema , Cap refill < 3 seconds, No obvious deformities Skin: no rashes or lesions  Na 131  K 4.3, Creatinine 3.37, Calcium corrects to 9.7, Albumin 1.6, Mag 2.7, Phos 3.5 WBC is 6.5, HGB 7.0, platelets 109, plan to restart heparin per neuro surgery, Checking heparin level now as he is getting 750 units per hour through the CVVHD filter system.  T max 98.8, Anuric, but net negative goal hourly is 50 cc per hour  Resolved Hospital  Problem list     Assessment & Plan:  Acute respiratory failure with hypoxia and hypercapnia due to post-op respiratory depression Acute respiratory acidosis>> resolved  Plan Continue ventilator support with lung protective strategies  Monitor for vent dyssynchrony Trend ABG prn CXR in am and prn Head of bed elevated 30 degrees. Peak, plateau and driving pressures are at goal Ensure adequate pulmonary hygiene  VAP bundle in place  PAD protocol Continue sedation with RASS goal -2--3 Sputum Culture today>> Increased sputum production, thick and purulent  Hypertriglyceridemia due to propofol infusion Patient's triglyceride levels are elevated > 500 Rate has been decreased to 40 Will recheck level in am Closely monitor serum triglyceride  Acute rhabdomyolysis in the setting of prolonged surgery and hypotension due to blood loss during surgery Oliguric acute renal failure due to rhabdomyolysis and ischemic ATN due to hypotension during surgery Nephrology input is appreciated Patient was started on CRRT Avoid nephrotoxins Ensure adequate renal perfusion   Acute bilateral multiple strokes CT head was done which showed small bilateral multiple strokes Appreciate neurology recommendation TSH B12 and folate level are within normal limits Cannot get MRI due to hardware in place  Lumbar spinal stenosis status post decompression and fusion surgery Drain in place with improved output Closely monitor drain output Monitor H&H Neurosurgery is following, defer management to primary team  Hypotension due to deep sedation and volume depletion  Distributive/ hypovolemic mechanism Patient is requiring IV vasopressors to maintain map goal of 65 Continue to titrate vasopressors Home antihypertensives on hold  Acute right lower extremity DVT, acute left IJ DVT Initially patient was started on IV heparin infusion Neurosurgery recommend stopping IV heparin considering high drain output IVC  filter was placed this 8/7 Per neuro surgery ok to restart heparin without bolus>> Will check heparin level now as he is receiving 750 units heparin via CVVH circuit now.>> Per Pharmacy  Acute metabolic encephalopathy Seizures disorder Hx of Bipolar 1 disorder  Continue home Zoloft, Seroquel but reduces Baclofen and gabapentin to avoid withdrawal PRN Flexeril, Oxy, and Dilaudid remain but will not be used while intubated  Maintain neuro protective measures; goal for eurothermia, euglycemia, eunatermia, normoxia, and PCO2 goal of 35-40 Nutrition and bowel regiment >> Added Senna and Miralax 8/9, No BM since TF started Seizure precautions  Continue home Keprra  Aspiration precautions  EEG is negative for active seizures  Tobacco dependence Smoking cessation counseling, once extubated Continue BD nebs Consider Lung Cancer Screening  Hypocalcemia/hyponatremia/hyperphosphatemia Closely monitor electrolytes and supplement   Best Practice    Diet/type: NPO DVT prophylaxis: SCD GI prophylaxis: PPI Lines: Yes, still needed Foley: Yes, still needed Code Status:  full code Last date of multidisciplinary goals of care discussion: 8/8 patient's family was updated at bedside  Critical care time:   Total critical care time: 35 minutes  Performed by: Eric Form, AGACNP-BC   Critical care time was exclusive of separately billable procedures and treating other patients.   Critical care was necessary to treat or prevent imminent or life-threatening deterioration.   Critical care was time spent personally by me on the following activities: development of treatment  plan with patient and/or surrogate as well as nursing, discussions with consultants, evaluation of patient's response to treatment, examination of patient, obtaining history from patient or surrogate, ordering and performing treatments and interventions, ordering and review of laboratory studies, ordering and review of radiographic  studies, pulse oximetry and re-evaluation of patient's condition.   Magdalen Spatz, AGACNP-BC [facility] See Amion for pager If no response to pager, please call [phone] until 7pm After 7pm, Please call E-link [phone] 12/18/2020

## 2020-12-18 NOTE — TOC CAGE-AID Note (Signed)
Transition of Care [facility]) - CAGE-AID Screening   Patient Details  Name: Keith Dixon MRN: [id number] Date of Birth: 1960-05-14  Transition of [facility]) CM/SW Contact:    Shyler Holzman C Tarpley-Carter, Mona Phone Number: 12/18/2020, 3:47 PM   Clinical Narrative:  Pt is unable to participate in Cage Aid.   Dandra Shambaugh Tarpley-Carter, MSW, LCSW-A Pronouns:  She/Her/Hers [facility] Clinical Social Worker Direct Number:  [phone] [email or web address]   CAGE-AID Screening: Substance Abuse Screening unable to be completed due to: : Patient unable to participate

## 2020-12-18 NOTE — Progress Notes (Signed)
ANTICOAGULATION CONSULT NOTE - Initial Consult  Pharmacy Consult for heparin Indication: DVT  No Known Allergies  Patient Measurements: Height: 6' (182.9 cm) Weight: 115.9 kg (255 lb 8.2 oz) IBW/kg (Calculated) : 77.6  Vital Signs: Temp: 98.8 F (37.1 C) (08/09 0800) Temp Source: Axillary (08/09 0800) BP: 131/53 (08/09 0900) Pulse Rate: 104 (08/09 0900)  Labs: Recent Labs    12/16/20 1042 12/16/20 1623 12/17/20 0500 12/17/20 0536 12/17/20 0810 12/17/20 1554 12/18/20 0430  HGB  --    < > 7.8* 7.5* 7.5*  --  7.0*  HCT  --    < > 23.1* 22.0* 22.0*  --  21.0*  PLT 82*  --  130*  --   --   --  109*  APTT 31  --   --   --   --   --   --   LABPROT 15.7*  --   --   --   --   --   --   INR 1.3*  --   --   --   --   --   --   CREATININE  --    < > 4.91*  --   --  4.12* 3.37*   < > = values in this interval not displayed.    Estimated Creatinine Clearance: 31.4 mL/min (A) (by C-G formula based on SCr of 3.37 mg/dL (H)).   Medical History: Past Medical History:  Diagnosis Date   Anxiety    Arthritis    Bipolar 1 disorder, depressed ([place])    Bipolar disorder ([place])    Depression    History of posttraumatic stress disorder (PTSD)    Hypertension    PTSD (post-traumatic stress disorder)    Seizures ([place])    Sleep apnea    Smoker    Tobacco use disorder     Medications:  Medications Prior to Admission  Medication Sig Dispense Refill Last Dose   acetaminophen (TYLENOL) 500 MG tablet Take 2 tablets (1,000 mg total) by mouth every 6 (six) hours as needed for mild pain. 30 tablet 0 12/12/2020 at 0500   amLODipine (NORVASC) 10 MG tablet Take 10 mg by mouth in the morning.   12/12/2020 at 0500   baclofen (LIORESAL) 10 MG tablet Take 10 mg by mouth 2 (two) times daily.   12/12/2020 at 0500   Cholecalciferol (VITAMIN D3) 50 MCG (2000 UT) TABS Take 2,000 Units by mouth in the morning and at bedtime.   Past Week   gabapentin (NEURONTIN) 300 MG capsule Take 900 mg by mouth 2 (two)  times daily.   12/12/2020 at 0500   hydrOXYzine (VISTARIL) 50 MG capsule Take 50 mg by mouth in the morning and at bedtime.   12/12/2020 at 0500   lamoTRIgine (LAMICTAL) 100 MG tablet Take 1 tablet (100 mg total) by mouth 2 (two) times daily. 60 tablet 2 12/12/2020 at 0500   levETIRAcetam (KEPPRA) 750 MG tablet Take 1,500 mg by mouth 2 (two) times daily.   12/12/2020 at 0500   lisinopril-hydrochlorothiazide (ZESTORETIC) 20-12.5 MG tablet Take 1 tablet by mouth in the morning.   12/11/2020   meloxicam (MOBIC) 15 MG tablet Take 15 mg by mouth daily after breakfast.   12/11/2020   QUEtiapine (SEROQUEL) 200 MG tablet Take 200 mg by mouth at bedtime.   12/11/2020   sertraline (ZOLOFT) 100 MG tablet Take 100 mg by mouth daily after breakfast.   12/12/2020 at 0500    Assessment: 13 YOM s/p interbody  fusion surgery developed a RLE DVT. Anticoagulation was initially held due to bleeding risk and an IVC filter was placed. Pharmacy now consulted to start IV heparin.   Of note, patient is on IV heparin at 750 units/hr via the CRRT circuit. An aPTT today was normal at 30. Hgb down to 7 but no s/s of overt bleeding noted. Transfusing 1 unit PRBC today.   Goal of Therapy:  aPTT 50-70 seconds. On 8/13, liberalize to 56-85 Monitor platelets by anticoagulation protocol: Yes   Plan:  -Start heparin infusion conservatively at 800 units/hr. No bolus -F/u 8 hr aPTT -Monitor daily aPTT, CBC and s/s of bleeding   Albertina Parr, PharmD., BCPS, BCCCP Clinical Pharmacist Please refer to [facility] for unit-specific pharmacist

## 2020-12-18 NOTE — Progress Notes (Signed)
Carotid duplex bilateral, TCD bubble, and TCD study completed.   Please see CV Proc for preliminary results.   Darlin Coco, RDMS, RVT

## 2020-12-18 NOTE — Progress Notes (Signed)
LTM discontinued; no skin breakdown was seen. [facility] notified.

## 2020-12-18 NOTE — Progress Notes (Signed)
Patient ID: Keith Dixon, male   DOB: 1960-05-19, 60 y.o.   MRN: [id number] Keith Dixon Progress Note   Assessment/ Plan:   1.  Acute kidney injury: Appears to be from a combination of ATN and rhabdomyolysis/pigment nephropathy.  He has dense renal injury.  He was started on CRRT on 8/7 for correction of multiple metabolic abnormalities and volume unloading.  Note that he also is charted as getting IV contrast on 8/7 - Continue CRRT - continue UF goal of net negative 50 ml/hr as tolerated  - heparin in filter - defer further increase for now  2.  Hyponatremia: Secondary to free water handling defect in the setting of acute kidney injury, unsuccessful improvement with diuresis, will now monitor with CRRT/volume unloading. Improved with CRRT  3.  Acute postoperative hypoxic/hypercapnic respiratory failure: Intubated and on ventilator support per CCM.  4.  Lumbar spine stenosis/scoliosis status post decompression and fusion surgery: Ongoing management/surveillance by neurosurgery.  5.  Shock: Secondary to distributive/hypovolemic mechanism.  Status post intravenous fluids and on pressors   6.  Acute blood loss anemia: Secondary to surgical associated losses, ongoing PRBC transfusion for continued downtrend of hemoglobin and hematocrit.  No ESA for now with acute CVA.  PRBC's today - located consent on chart with RN  7.  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  Subjective:   He had 250 ml uop over 8/8.  He has been on CRRT and had 3.4 liters UF over 8/8 with CRRT.  He has been at net negative 50 ml/hr as tolerated.  Filter clotted and his fixed dose heparin per circuit was increased per protocol.  On levo at 12.5 mcg/min  Review of systems:   Unable to obtain 2/2 mechanical ventilation    Objective:   BP (!) 131/53   Pulse (!) 104   Temp 98.8 F (37.1 C) (Axillary)   Resp 14   Ht 6' (1.829 m)   Wt 115.9 kg   SpO2 96%   BMI  34.65 kg/m   Intake/Output Summary (Last 24 hours) at 12/18/2020 2229 Last data filed at 12/18/2020 0900 Gross per 24 hour  Intake 3192.09 ml  Output 3739 ml  Net -546.91 ml   Weight change:   Physical Exam:   General adult male in bed intubated HEENT normocephalic atraumatic EEG monitoring Neck trachea midline Lungs coarse mechanical breath sounds Heart S1S2 no rub Abdomen soft nontender with sedation nondistended Extremities trace to 1+ edema lower extremities Neuro - continuous sedation running Access: left femoral nontunneled dialysis catheter   Imaging: CT HEAD WO CONTRAST (5MM)  Result Date: 12/18/2020 CLINICAL DATA:  Stroke.  Follow-up. EXAM: CT HEAD WITHOUT CONTRAST TECHNIQUE: Contiguous axial images were obtained from the base of the skull through the vertex without intravenous contrast. COMPARISON:  Head CT 2 days ago. FINDINGS: Brain: Subacute infarction in the left thalamus appears similar, without swelling or hemorrhage. No definite cerebellar infarction is confirmed. Small focal low densities in the bilateral parietal and occipital subcortical white matter and cortical brain are more noticeable. No large confluent infarction is identified. MRI would be useful to more completely understand. No evidence of hemorrhage or shift. No hydrocephalus or extra-axial collection. Vascular: There is atherosclerotic calcification of the major vessels at the base of the brain. Skull: Negative Sinuses/Orbits: Clear/normal Other: Right scalp sebaceous cyst as seen previously. 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 Redemonstration of small cortical and subcortical infarctions in  the parietal and occipital regions without evidence of hemorrhage or mass effect. Electronically Signed   By: Nelson Chimes M.D.   On: 12/18/2020 07:31   CT HEAD WO CONTRAST (5MM)  Result Date: 12/16/2020 CLINICAL DATA:  Mental status change EXAM: CT HEAD  WITHOUT CONTRAST TECHNIQUE: Contiguous axial images were obtained from the base of the skull through the vertex without intravenous contrast. COMPARISON:  09/11/2020 FINDINGS: Brain: Small acute appearing infarcts seen along the bilateral cerebral cortex, including right frontal, right occipital, and left parietal. Subcortical infarcts may have also occurred and there is also a new left thalamic lacunar infarct. Suspect small bilateral cerebellar infarcts, limitation in the posterior fossa and ventral cerebrum due to streak artifact/motion. No hemorrhage, hydrocephalus, or masslike finding. Vascular: No detected hyperdense vessel. Skull: Negative Sinuses/Orbits: Negative Dermal inclusion cyst along the right temporal scalp. Other: To expedite communication, these results will be called to the ordering clinician or representative/RN by the Radiologist Assistant, and communication documented in the PACS or [employer]. IMPRESSION: Small recent appearing bilateral cortical infarcts, left thalamic and probable bilateral cerebellar infarcts. The multifocality suggests embolic disease. Electronically Signed   By: [name] M.D.   On: 12/16/2020 10:42   IR IVC FILTER PLMT / S&I Burke Keels GUID/MOD SED  Result Date: 12/16/2020 CLINICAL DATA:  60 year old male with history of recent extensive spinal fusion surgery and diagnosis of lower extremity deep vein thrombosis, unable to tolerate anticoagulation giving bleeding from surgical site. EXAM: 1. ULTRASOUND GUIDANCE FOR VASCULAR ACCESS OF THE RIGHT internal jugular VEIN. 2. IVC VENOGRAM. 3. PERCUTANEOUS IVC FILTER PLACEMENT. ANESTHESIA/SEDATION: None. CONTRAST:  42mL OMNIPAQUE IOHEXOL 300 MG/ML  SOLN FLUOROSCOPY TIME:  24 seconds, 135 mGy PROCEDURE: The procedure, risks, benefits, and alternatives were explained to the patient. Questions regarding the procedure were encouraged and answered. The patient understands and consents to the procedure. The patient was  prepped with Betadine in a sterile fashion, and a sterile drape was applied covering the operative field. A sterile gown and sterile gloves were used for the procedure. Local anesthesia was provided with 1% Lidocaine. Under direct ultrasound guidance, a 21 gauge needle was advanced into the right internal jugular vein with ultrasound image documentation performed. After securing access with a micropuncture dilator, a guidewire was advanced into the inferior vena cava. A deployment sheath was advanced over the guidewire. This was utilized to perform IVC venography. The deployment sheath was further positioned in an appropriate location for filter deployment. A Denali IVC filter was then advanced in the sheath. This was then fully deployed in the infrarenal IVC. Final filter position was confirmed with a fluoroscopic spot image. Contrast injection was also performed through the sheath under fluoroscopy to confirm patency of the IVC at the level of the filter. After the procedure the sheath was removed and hemostasis obtained with manual compression. COMPLICATIONS: None. FINDINGS: IVC venography demonstrates a normal caliber IVC with no evidence of thrombus. Renal veins are identified bilaterally. The IVC filter was successfully positioned below the level of the renal veins and is appropriately oriented. This IVC filter has both permanent and retrievable indications. IMPRESSION: Placement of percutaneous IVC filter in the infrarenal IVC. IVC venogram shows no evidence of IVC thrombus and normal caliber of the inferior vena cava. This filter has both permanent and retrievable indications. PLAN: This IVC filter is potentially retrievable. The patient will be assessed for filter retrieval by Interventional Radiology in approximately 8-12 weeks. Further recommendations regarding filter retrieval, continued surveillance or declaration of device  permanence, will be made at that time. Ruthann Cancer, MD [facility] Radiology Electronically Signed   By: Ruthann Cancer MD   On: 12/16/2020 10:27   DG Chest Port 1 View  Result Date: 12/16/2020 CLINICAL DATA:  Ventilator dyssynchrony EXAM: PORTABLE CHEST 1 VIEW COMPARISON:  12/14/2020, 12/13/2020 FINDINGS: Endotracheal tube tip is about 2.8 cm superior to carina. Esophageal tube tip below the diaphragm but incompletely visualized. Right IJ central venous catheter tip over the SVC. Low lung volumes. Normal cardiac size. Interim development of hazy and streaky perihilar opacity with patchy right lower lung airspace disease. No pneumothorax IMPRESSION: 1. Endotracheal tube tip about 2.8 cm superior to carina 2. Low lung volumes. Interim development of streaky and hazy perihilar opacity which may be due to vascular congestion versus atypical infection. There is patchy airspace disease in the right lower lung Electronically Signed   By: Donavan Foil M.D.   On: 12/16/2020 22:33   EEG adult  Result Date: 12/16/2020 Keith Jack, MD     12/16/2020  3:29 PM Routine EEG Report Keith Dixon is a 60 y.o. male with a history of encephalopathy who is undergoing an EEG to evaluate for seizures. Report: This EEG was acquired with electrodes placed according to the International 10-20 electrode system (including Fp1, Fp2, F3, F4, C3, C4, P3, P4, O1, O2, T3, T4, T5, T6, A1, A2, Fz, Cz, Pz). The following electrodes were missing or displaced: none. Best background was composed of frequencies of 4-6 Hz. This activity is reactive to stimulation. No sleep architecture was identified. There was no focal slowing. There were no interictal epileptiform discharges. There were no electrographic seizures identified. Photic stimulation and hyperventilation were not performed. Impression and clinical correlation: This EEG was obtained while unresponsive and is abnormal due to moderate diffuse slowing indicative of global cerebral dysfunction.   Keith Monks, MD [facility] [phone] If 7pm- 7am, please page neurology on call as listed in [place].   Overnight EEG with video  Result Date: 12/16/2020 Keith Havens, MD     12/18/2020  8:11 AM Patient Name: Keith Dixon MRN: [id number] Epilepsy Attending: Lora Dixon Referring Physician/Provider: Claiborne Dixon, [place] Duration: 12/16/2020 1436 to 12/17/2020 1436 Patient history:  60 y.o. male with a history of encephalopathy who is undergoing an EEG to evaluate for seizures Level of alertness: lethargic AEDs during EEG study: propofol, LEV, LTG, GBP Technical aspects: This EEG study was done with scalp electrodes positioned according to the 10-20 International system of electrode placement. Electrical activity was acquired at a sampling rate of $Remov'500Hz'riMNCb$  and reviewed with a high frequency filter of $RemoveB'70Hz'BtGiQvrB$  and a low frequency filter of $RemoveB'1Hz'NnNZpbej$ . EEG data were recorded continuously and digitally stored. Description: No posterior dominant rhythm was seen.  EEG initially showed continuous generalized 3 to 6 Hz theta-delta slowing. On 12/16/2020 after around 1830, EEG showed bursts suppression pattern with bursts of 4-$RemoveBef'6hz'TKUNcmgzhv$  theta-delta slowing lasting 2-3 seconds alternating with eeg suppression lasting 8-10 seconds. Hyperventilation and photic stimulation were not performed.   EKG, artifact, significant myogenic artifact and electrode artifact was seen throughout the study. ABNORMALITY - Continuous slow, generalized - Burst suppression, generalized IMPRESSION: This technically difficult study was initially suggestive of moderate diffuse encephalopathy, nonspecific etiology. After around 1830 on 12/16/2020, eeg was suggestive of profound diffuse encephalopathy, likely due to sedation. No seizures or epileptiform discharges were seen throughout the recording. [place]: [employer]  Lab 12/14/20 1745 12/14/20 2154 12/15/20 0459 12/16/20 0509 12/16/20 1623 12/16/20 2149 12/17/20 0018  12/17/20 0248 12/17/20 0500 12/17/20 0536 12/17/20 0810 12/17/20 1554 12/18/20 0430  NA 132*   < > 130* 128* 127*   < > 128* 129* 130* 130* 129* 130* 131*  K 4.7   < > 4.4 4.6 4.8   < > 5.2* 5.4* 5.2* 5.3* 5.3* 4.7 4.3  CL 104  --  101 98 98  --   --   --  99  --   --  99 98  CO2 18*  --  19* 19* 18*  --   --   --  20*  --   --  22 23  GLUCOSE 152*  --  164* 124* 114*  --   --   --  130*  --   --  105* 145*  BUN 32*  --  44* 62* 66*  --   --   --  48*  --   --  40* 33*  CREATININE 4.05*  --  4.91* 6.63* 6.74*  --   --   --  4.91*  --   --  4.12* 3.37*  CALCIUM 7.5*  --  7.1* 7.2* 7.0*  --   --   --  7.6*  --   --  7.9* 7.8*  PHOS 6.3*  --  5.5* 5.4* 6.4*  --   --   --  5.7*  --   --  4.5 3.5   < > = values in this interval not displayed.   CBC Recent Labs  Lab 12/13/20 0740 12/13/20 1513 12/15/20 0459 12/16/20 0509 12/16/20 1042 12/16/20 2149 12/17/20 0500 12/17/20 0536 12/17/20 0810 12/18/20 0430  WBC 13.2*   < > 14.0* 9.1  --   --  9.2  --   --  6.5  NEUTROABS 11.1*  --   --   --   --   --   --   --   --   --   HGB 13.4   < > 8.9* 7.5*  --    < > 7.8* 7.5* 7.5* 7.0*  HCT 40.8   < > 26.6* 22.4*  --    < > 23.1* 22.0* 22.0* 21.0*  MCV 81.0   < > 84.4 84.2  --   --  85.9  --   --  83.7  PLT 222   < > PLATELET CLUMPS NOTED ON SMEAR, UNABLE TO ESTIMATE 72* 82*  --  130*  --   --  109*   < > = values in this interval not displayed.   Medications:     sodium chloride   Intravenous Once   albuterol  2.5 mg Nebulization Q6H   artificial tears  1 application Both Eyes O1Y   aspirin  81 mg Per Tube Daily   chlorhexidine gluconate (MEDLINE KIT)  15 mL Mouth Rinse BID   Chlorhexidine Gluconate Cloth  6 each Topical Daily   cholecalciferol  2,000 Units Per Tube BID AC & HS   cyanocobalamin  1,000 mcg Intramuscular Daily   Followed by   Derrill Memo ON 12/23/2020] vitamin B-12  1,000 mcg Oral Daily   docusate  100 mg Per Tube BID   feeding supplement (PIVOT 1.5 CAL)  1,000 mL Per Tube  [id number]   folic acid  1 mg Intravenous Daily   gabapentin  300 mg Per Tube Q12H   lamoTRIgine  100 mg Per Tube BID  levETIRAcetam  1,000 mg Per Tube BID   mouth rinse  15 mL Mouth Rinse 10 times per day   pantoprazole sodium  40 mg Per Tube QHS   QUEtiapine  200 mg Per Tube QHS   sertraline  100 mg Per Tube QPC breakfast   sodium chloride flush  3 mL Intravenous Q12H   [START ON 12/25/2020] thiamine injection  100 mg Intravenous Daily   Claudia Desanctis, MD 12/18/2020, 9:37 AM

## 2020-12-18 NOTE — Progress Notes (Signed)
Nutrition Follow-up  DOCUMENTATION CODES:   Not applicable  INTERVENTION:   Tube feeding via OG tube: Pivot 1.5 at 65 ml/h (1560 ml per day) 90 ml ProSource TF BID  Provides 2500 kcal, 190 gm protein, 1184 ml free water daily   NUTRITION DIAGNOSIS:   Increased nutrient needs related to wound healing as evidenced by estimated needs. Ongoing.   GOAL:   Patient will meet greater than or equal to 90% of their needs Met with TF.   MONITOR:   TF tolerance  REASON FOR ASSESSMENT:   Consult Enteral/tube feeding initiation and management  ASSESSMENT:   Pt with PMH of anxiety, PTSD, bipolar 1 disorder, depression, HTN, Sz, sleep apnea, and a smoker who was admitted for severe scoliosis with multi-level listhesis.    Pt discussed during ICU rounds and with RN. Pt developed AKI and rhabdomyolysis after complex two-stage spinal surgery requiring CRRT. Weaning sedation and pressor.    8/3 s/p L1-2, L2-3, L3-4, L4-5, DLIFs 8/4 T10-pelvis (10 procedure) 8/7 started on CRRT  Patient is currently intubated on ventilator support MV: 10.7 L/min Temp (24hrs), Avg:98.2 F (36.8 C), Min:97.9 F (36.6 C), Max:98.8 F (37.1 C)  Propofol @ 24 ml/hr provides: 633 kcal   Medications reviewed and include: vitamin D3, vitamin F40, colace, folic acid, protonix, miralax, thiamine  Precedex Fentanyl  Labs reviewed: Na 131   OG: tip proximal stomach Current TF:  Pivot 1.5 at 70 ml/h  Provides 2520 kcal, 157 gm protein  Diet Order:   Diet Order             Diet NPO time specified  Diet effective now                   EDUCATION NEEDS:   Not appropriate for education at this time  Skin:  Skin Assessment: Reviewed RN Assessment  Last BM:  unknown  Height:   Ht Readings from Last 1 Encounters:  12/13/20 6' (1.829 m)    Weight:   Wt Readings from Last 1 Encounters:  12/18/20 115.9 kg    BMI:  Body mass index is 34.65 kg/m.  Estimated Nutritional Needs:    Kcal:  2200-2500  Protein:  161-202 grams  Fluid:  >2 L/day  Lockie Pares., RD, LDN, CNSC See AMiON for contact information

## 2020-12-18 NOTE — Procedures (Signed)
Patient Name: ZEPH SISTARE  MRN: [id number]  Epilepsy Attending: Lora Havens  Referring Physician/Provider: Claiborne Billings, [place] Duration: 12/17/2020 1436 to 12/18/2020 [id number]   Patient history:  60 y.o. male with a history of encephalopathy who is undergoing an EEG to evaluate for seizures   Level of alertness: lethargic   AEDs during EEG study: propofol, LEV, LTG, GBP   Technical aspects: This EEG study was done with scalp electrodes positioned according to the 10-20 International system of electrode placement. Electrical activity was acquired at a sampling rate of '500Hz'$  and reviewed with a high frequency filter of '70Hz'$  and a low frequency filter of '1Hz'$ . EEG data were recorded continuously and digitally stored.   Description: No posterior dominant rhythm was seen.  EEG initially showed continuous generalized 3 to 6 Hz theta-delta slowing. Hyperventilation and photic stimulation were not performed.      EKG, artifact, significant myogenic artifact was seen throughout the study.    ABNORMALITY - Continuous slow, generalized   IMPRESSION: This study is suggestive of moderate to severe diffuse encephalopathy, nonspecific etiology. No seizures or epileptiform discharges were seen throughout the recording.     Puja Caffey Barbra Sarks

## 2020-12-18 NOTE — Progress Notes (Addendum)
STROKE TEAM PROGRESS NOTE   INTERVAL HISTORY No family at bedside. Currently on LTM, intubated, sedated  Vitals:   12/18/20 1253 12/18/20 1300 12/18/20 1315 12/18/20 1330  BP:  (!) 122/52  (!) 104/48  Pulse: 85 90 91 91  Resp: (!) 28 (!) 28 (!) 28 (!) 28  Temp: 97.9 F (36.6 C)     TempSrc: Axillary     SpO2: 100% 98% 100% 100%  Weight:      Height:       CBC:  Recent Labs  Lab 12/13/20 0740 12/13/20 1513 12/17/20 0500 12/17/20 0536 12/17/20 0810 12/18/20 0430  WBC 13.2*   < > 9.2  --   --  6.5  NEUTROABS 11.1*  --   --   --   --   --   HGB 13.4   < > 7.8*   < > 7.5* 7.0*  HCT 40.8   < > 23.1*   < > 22.0* 21.0*  MCV 81.0   < > 85.9  --   --  83.7  PLT 222   < > 130*  --   --  109*   < > = values in this interval not displayed.   Basic Metabolic Panel:  Recent Labs  Lab 12/17/20 0500 12/17/20 0536 12/17/20 1554 12/18/20 0430  NA 130*   < > 130* 131*  K 5.2*   < > 4.7 4.3  CL 99  --  99 98  CO2 20*  --  22 23  GLUCOSE 130*  --  105* 145*  BUN 48*  --  40* 33*  CREATININE 4.91*  --  4.12* 3.37*  CALCIUM 7.6*  --  7.9* 7.8*  MG 2.5*  --   --  2.7*  PHOS 5.7*  --  4.5 3.5   < > = values in this interval not displayed.   Lipid Panel:  Recent Labs  Lab 12/16/20 1623 12/17/20 0500  CHOL 89  --   TRIG 235* 586*  HDL 12*  --   CHOLHDL 7.4  --   VLDL 47*  --   LDLCALC 30  --    HgbA1c:  Recent Labs  Lab 12/17/20 0500  HGBA1C 5.8*    IMAGING past 24 hours CT HEAD WO CONTRAST (5MM)  Result Date: 12/18/2020 CLINICAL DATA:  Stroke.  Follow-up. EXAM: CT HEAD WITHOUT CONTRAST TECHNIQUE: Contiguous axial images were obtained from the base of the skull through the vertex without intravenous contrast. COMPARISON:  Head CT 2 days ago. FINDINGS: Brain: Subacute infarction in the left thalamus appears similar, without swelling or hemorrhage. No definite cerebellar infarction is confirmed. Small focal low densities in the bilateral parietal and occipital subcortical  white matter and cortical brain are more noticeable. No large confluent infarction is identified. MRI would be useful to more completely understand. No evidence of hemorrhage or shift. No hydrocephalus or extra-axial collection. Vascular: There is atherosclerotic calcification of the major vessels at the base of the brain. Skull: Negative Sinuses/Orbits: Clear/normal Other: Right scalp sebaceous cyst as seen previously. 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 Redemonstration of small cortical and subcortical infarctions in the parietal and occipital regions without evidence of hemorrhage or mass effect. Electronically Signed   By: Nelson Chimes M.D.   On: 12/18/2020 07:31   VAS [place] TRANSCRANIAL DOPPLER W BUBBLES  Result Date: 12/18/2020  Transcranial Doppler with Bubble Patient Name:  Keith Dixon  Date of Exam:  12/18/2020 Medical Rec #: [id number]         Accession #:    [id number] Date of Birth: 05-31-1960          Patient Gender: M Patient Age:   60 years Exam Location:  [facility] Procedure:      VAS [place] TRANSCRANIAL DOPPLER W BUBBLES Referring Phys: Cornelius Moras [place] --------------------------------------------------------------------------------  Indications: Stroke. Comparison Study: No prior studies. Performing Technologist: Darlin Coco RDMS,RVT  Examination Guidelines: A complete evaluation includes B-mode imaging, spectral Doppler, color Doppler, and power Doppler as needed of all accessible portions of each vessel. Bilateral testing is considered an integral part of a complete examination. Limited examinations for reoccurring indications may be performed as noted.  Summary: No HITS at rest or during Valsalva. Negative transcranial Doppler Bubble study with no evidence of right to left intracardiac communication.  A vascular evaluation was performed. The left middle cerebral artery was studied. An IV was inserted into the patient's left  forearm. Verbal informed consent was obtained.  *See table(s) above for TCD measurements and observations.    Preliminary    VAS US CAROTID  Result Date: 12/18/2020 Carotid Arterial Duplex Study Patient Name:  Keith Dixon  Date of Exam:   12/18/2020 Medical Rec #: [id number]         Accession #:    [id number] Date of Birth: [birth year]          Patient Gender: M Patient Age:   60 years Exam Location:  [facility] Procedure:      VAS US CAROTID Referring Phys: Alferd Pate[facility] --------------------------------------------------------------------------------  Indications:       CVA. Risk Factors:      Hypertension, current smoker. Limitations        Today's exam was limited due to central venous catheter right                    jugular. Comparison Study:  No prior studies. Performing Technologist: Darlin Coco RDMS,RVT  Examination Guidelines: A complete evaluation includes B-mode imaging, spectral Doppler, color Doppler, and power Doppler as needed of all accessible portions of each vessel. Bilateral testing is considered an integral part of a complete examination. Limited examinations for reoccurring indications may be performed as noted.  Left Carotid Findings: +----------+--------+--------+--------+------------------+---------------------+           PSV cm/sEDV cm/sStenosisPlaque DescriptionComments              +----------+--------+--------+--------+------------------+---------------------+ CCA Prox  226     33                                Intimal thickening,                                                       Elevated velocities                                                       without evidence of  focal stenosis        +----------+--------+--------+--------+------------------+---------------------+ CCA Distal184     22                                                       +----------+--------+--------+--------+------------------+---------------------+ ICA Prox  93      38      1-39%   heterogenous                            +----------+--------+--------+--------+------------------+---------------------+ ICA Distal145     48                                tortuous              +----------+--------+--------+--------+------------------+---------------------+ ECA       113     18                                                      +----------+--------+--------+--------+------------------+---------------------+ +----------+--------+--------+-----------------------------+-------------------+           PSV cm/sEDV cm/sDescribe                     Arm Pressure (mmHG) +----------+--------+--------+-----------------------------+-------------------+ [id number]             Elevated velocities without                                                evidence of focal stenosis                       +----------+--------+--------+-----------------------------+-------------------+ +---------+--------+--+--------+--+---------+ VertebralPSV cm/s87EDV cm/s25Antegrade +---------+--------+--+--------+--+---------+   Summary:  Left Carotid: Velocities in the left ICA are consistent with a 1-39% stenosis. Vertebrals:  Left vertebral artery demonstrates antegrade flow. Subclavians: Normal flow hemodynamics were seen in the left subclavian artery. *See table(s) above for measurements and observations.     Preliminary    VAS [place] TRANSCRANIAL DOPPLER  Result Date: 12/18/2020  Transcranial Doppler Patient Name:  Keith Dixon  Date of Exam:   12/18/2020 Medical Rec #: [id number]         Accession #:    [id number] Date of Birth: November 30, 1960          Patient Gender: M Patient Age:   40 years Exam Location:  [facility] Procedure:      VAS [place] TRANSCRANIAL DOPPLER Referring Phys: Cornelius Moras Anniebell Bedore  --------------------------------------------------------------------------------  Indications: Stroke. Limitations: Central venous catheter RT jugular and collar. Comparison Study: No prior studies. Performing Technologist: Darlin Coco RDMS,RVT  Examination Guidelines: A complete evaluation includes B-mode imaging, spectral Doppler, color Doppler, and power Doppler as needed of all accessible portions of each vessel. Bilateral testing is considered an integral part of a complete examination. Limited examinations for reoccurring indications may be performed as noted.  +----------+-------------+---------+-----------+-------------------------------+ RIGHT TCD Right VM (cm)  Depth  Pulsatility            Comment                                      (  cm)                                              +----------+-------------+---------+-----------+-------------------------------+ MCA           74.00                1.21                                    +----------+-------------+---------+-----------+-------------------------------+ ACA          -26.00                0.88                                    +----------+-------------+---------+-----------+-------------------------------+ Term ICA      45.00                0.97                                    +----------+-------------+---------+-----------+-------------------------------+ PCA           43.00                0.94                                    +----------+-------------+---------+-----------+-------------------------------+ Opthalmic     20.00                1.83                                    +----------+-------------+---------+-----------+-------------------------------+ ICA siphon    88.00                1.17                                    +----------+-------------+---------+-----------+-------------------------------+ Vertebral                                     Unable to insonate due to                                                            collar              +----------+-------------+---------+-----------+-------------------------------+ Distal ICA                                  Unable to insonate due to CVC  +----------+-------------+---------+-----------+-------------------------------+  +----------+------------+----------+-----------+------------------------------+ LEFT TCD  Left VM (cm)Depth (cm)Pulsatility           Comment             +----------+------------+----------+-----------+------------------------------+  MCA          105.00                1.25                                   +----------+------------+----------+-----------+------------------------------+ ACA          -47.00                0.79                                   +----------+------------+----------+-----------+------------------------------+ Term ICA     49.00                 1.18                                   +----------+------------+----------+-----------+------------------------------+ PCA          45.00                 1.21                                   +----------+------------+----------+-----------+------------------------------+ Opthalmic    25.00                 1.90                                   +----------+------------+----------+-----------+------------------------------+ ICA siphon   48.00                 1.19                                   +----------+------------+----------+-----------+------------------------------+ Vertebral                                    Unable to insonate due to                                                           collar             +----------+------------+----------+-----------+------------------------------+ Distal ICA   47.00                 1.14                                   +----------+------------+----------+-----------+------------------------------+   +------------+-----+--------------------------------+             VM cm            Comment              +------------+-----+--------------------------------+ Prox Basilar     Unable to insonate due to collar +------------+-----+--------------------------------+ Dist Basilar     Unable to insonate due to collar +------------+-----+--------------------------------+ +----------------------+----------------+  Right Lindegaard RatioUnable to obtain +----------------------+----------------+ +---------------------+----+ Left Lindegaard Ratio2.23 +---------------------+----+    Preliminary     PHYSICAL EXAM Physical Exam  Constitutional: Appears well-developed and well-nourished.  Eyes: Normal external eye and conjunctiva. HENT: Normocephalic, no lesions, without obvious abnormality.   Musculoskeletal-no joint tenderness, deformity or swelling Cardiovascular: Normal rate and regular rhythm.  Respiratory: Effort normal, non-labored breathing saturations WNL GI: Soft.  No distension. There is no tenderness.  Skin: WDI   Neuro:  Mental Status: Patient intubated/sedated Cranial Nerves: + weak corneals bilaterally, face appear symmetric in presence of ETT. Doll's eyes not present. No withdrawal to noxious stimuli.  + cough and Gag.     ASSESSMENT/PLAN Keith Dixon is a 60 y.o. male with PMH significant for PTSD, OSA, HTN, tobacco use, ?seizures, bipolar, degenerativse scoliosis who underwent extensive T10-pelvis decompression and fusion over 2 days(8/4 and 8/5) and difficulty waking up after surgery. Also complicated by Aki, rhabdomyolysis, DVT in RLE and left IJ s/p IVC filter and poor mentation/encephalopathy. Workup with CTH demonstrated embolic appearing strokes and neurology consulted.  #Stroke: bilateral cortical MCA/ACA watershed area and left thalamic infarcts, concerning for perioperative hypotension in the setting of intracranial stenosis. Can not rule out cardioembolic  source CT head Small recent appearing bilateral cortical infarcts, left thalamic and probable bilateral cerebellar infarcts.  MRI  unable d/t hardware and CRRT with multiple lines of meds Carotid Doppler w/LICA XX123456 % stenosis, RICA not able to sonicate due to central line 2D Echo w/EF 65 to 70 %, LA normal in size  TCD bubble study neg TCD pending LDL 30 HgbA1c 5.8 VTE prophylaxis - heparin IV No antithrombotic prior to admission, now on heparin IV given DVT Therapy recommendations:  pending Disposition:  pending  #Hypertension Home meds:  norvasc Stable Permissive hypertension (OK if < 220/120) but gradually normalize in 5-7 days Long-term BP goal normotensive  Hyperlipidemia Home meds:  none,  LDL 30, goal < 70  Diabetes type II (no diagnosis) Home meds:  none HgbA1c 5.8, goal < 7.0 CBGs Recent Labs    12/18/20 0334 12/18/20 0727 12/18/20 1137  GLUCAP 109* 108* 133*    Other Stroke Risk Factors Advanced Age >/= 54  Cigarette smoker advised to stop smoking ETOH use, alcohol level <10, advised to drink no more than 2 drink(s) a day Substance abuse - UDS:  reported THC use Obesity, Body mass index is 34.65 kg/m., BMI >/= 30 associated with increased stroke risk, recommend weight loss, diet and exercise as appropriate    Other Active Problems Hx of Seizures- LTM did not show any seizures or epileptiform discharges. Continue Keppra and Lamictal  AKI:  BUN: 48 creatinine: 4.91 on CRRT  Hyperkalemia: 5.2 Hypocalcemia:7 .6 Rhabdomyolysis: CK: 11,986 CK MB: 37.8  Hospital day # 6  Ruta Hinds, NP  Stroke Service Nurse Practitioner Patient seen and discussed with attending physician Dr. Erlinda Hong   ATTENDING NOTE: I reviewed above note and agree with the assessment and plan. Pt was seen and examined.   RN and Dr. Marcello Moores are at bedside.  Still on Tylenol, propofol and Levophed.  Neuro exam unchanged.  Patient back surgery drainage removed today by Dr. Marcello Moores.  TCD  bubble study negative for PFO.  Repeat CT showed left thalamic subacute infarct as well as bilateral MCA/ACA watershed infarcts.  Given the distribution, concerning for perioperative hypotension in the setting of intracranial stenosis, however cardioembolic source cannot be completely excluded.  Currently, TCD pending, may consider CTA head neck once renal  function improves.  Discussed with Dr. Marcello Moores, will start heparin IV for DVT treatment.  Will DC aspirin 81.  EEG no seizure, DC long-term EEG.  Continue home Keppra and Lamictal.  Continue B12 supplement.  We will follow  For detailed assessment and plan, please refer to above as I have made changes wherever appropriate.   Rosalin Hawking, MD PhD Stroke Neurology 12/18/2020 5:49 PM  This patient is critically ill due to stroke, respiratory failure, extensive back surgery, AKI, severe anemia, DVT and at significant risk of neurological worsening, death form recurrent stroke, hemorrhagic version, renal failure, heart failure, respiratory failure, seizure, and PE. This patient's care requires constant monitoring of vital signs, hemodynamics, respiratory and cardiac monitoring, review of multiple databases, neurological assessment, discussion with family, other specialists and medical decision making of high complexity. I spent 35 minutes of neurocritical care time in the care of this patient.  I discussed with neurosurgery Dr. Marcello Moores.  To contact Stroke Continuity provider, please refer to [email or web address] After hours, contact General Neurology

## 2020-12-18 NOTE — Progress Notes (Signed)
ANTICOAGULATION CONSULT NOTE  Pharmacy Consult for heparin Indication: DVT  No Known Allergies  Patient Measurements: Height: 6' (182.9 cm) Weight: 115.9 kg (255 lb 8.2 oz) IBW/kg (Calculated) : 77.6  Vital Signs: Temp: 98.7 F (37.1 C) (08/09 2000) Temp Source: Oral (08/09 2000) BP: 106/51 (08/09 2100) Pulse Rate: 94 (08/09 2000)  Labs: Recent Labs    12/16/20 1042 12/16/20 1623 12/17/20 0500 12/17/20 0536 12/17/20 0810 12/17/20 1554 12/18/20 0430 12/18/20 1009 12/18/20 1600 12/18/20 1744 12/18/20 2033  HGB  --    < > 7.8*   < > 7.5*  --  7.0*  --   --  7.3*  --   HCT  --    < > 23.1*   < > 22.0*  --  21.0*  --   --  22.0*  --   PLT 82*  --  130*  --   --   --  109*  --   --  128*  --   APTT 31  --   --   --   --   --   --  31  --   --  38*  LABPROT 15.7*  --   --   --   --   --   --   --   --   --   --   INR 1.3*  --   --   --   --   --   --   --   --   --   --   CREATININE  --    < > 4.91*  --   --  4.12* 3.37*  --  3.13*  --   --    < > = values in this interval not displayed.     Estimated Creatinine Clearance: 33.8 mL/min (A) (by C-G formula based on SCr of 3.13 mg/dL (H)).   Medical History: Past Medical History:  Diagnosis Date   Anxiety    Arthritis    Bipolar 1 disorder, depressed ([place])    Bipolar disorder ([place])    Depression    History of posttraumatic stress disorder (PTSD)    Hypertension    PTSD (post-traumatic stress disorder)    Seizures ([place])    Sleep apnea    Smoker    Tobacco use disorder     Medications:  Medications Prior to Admission  Medication Sig Dispense Refill Last Dose   acetaminophen (TYLENOL) 500 MG tablet Take 2 tablets (1,000 mg total) by mouth every 6 (six) hours as needed for mild pain. 30 tablet 0 12/12/2020 at 0500   amLODipine (NORVASC) 10 MG tablet Take 10 mg by mouth in the morning.   12/12/2020 at 0500   baclofen (LIORESAL) 10 MG tablet Take 10 mg by mouth 2 (two) times daily.   12/12/2020 at 0500    Cholecalciferol (VITAMIN D3) 50 MCG (2000 UT) TABS Take 2,000 Units by mouth in the morning and at bedtime.   Past Week   gabapentin (NEURONTIN) 300 MG capsule Take 900 mg by mouth 2 (two) times daily.   12/12/2020 at 0500   hydrOXYzine (VISTARIL) 50 MG capsule Take 50 mg by mouth in the morning and at bedtime.   12/12/2020 at 0500   lamoTRIgine (LAMICTAL) 100 MG tablet Take 1 tablet (100 mg total) by mouth 2 (two) times daily. 60 tablet 2 12/12/2020 at 0500   levETIRAcetam (KEPPRA) 750 MG tablet Take 1,500 mg by mouth 2 (two) times daily.  12/12/2020 at 0500   lisinopril-hydrochlorothiazide (ZESTORETIC) 20-12.5 MG tablet Take 1 tablet by mouth in the morning.   12/11/2020   meloxicam (MOBIC) 15 MG tablet Take 15 mg by mouth daily after breakfast.   12/11/2020   QUEtiapine (SEROQUEL) 200 MG tablet Take 200 mg by mouth at bedtime.   12/11/2020   sertraline (ZOLOFT) 100 MG tablet Take 100 mg by mouth daily after breakfast.   12/12/2020 at 0500    Assessment: 32 YOM s/p interbody fusion surgery developed a RLE DVT. Anticoagulation was initially held due to bleeding risk and an IVC filter was placed. Pharmacy now consulted to start IV heparin.   Of note, patient is on IV heparin at 750 units/hr via the CRRT circuit. Initial aPTT is subtherapeutic at 38 seconds.  Goal of Therapy:  aPTT 50-70 seconds. On 8/13, liberalize to 56-85 Monitor platelets by anticoagulation protocol: Yes   Plan:  -Increase heparin to 1000 units/h -Recheck aPTT in 8h  Arrie Senate, PharmD, [place], [facility] Clinical Pharmacist [phone] Please check AMION for all [place] numbers 12/18/2020

## 2020-12-18 NOTE — Progress Notes (Signed)
Subjective: NAEs o/n  Objective: Vital signs in last 24 hours: Temp:  [98 F (36.7 C)-98.8 F (37.1 C)] 98.8 F (37.1 C) (08/09 0800) Pulse Rate:  [88-109] 104 (08/09 0900) Resp:  [12-29] 14 (08/09 0900) BP: (102-165)/(43-61) 131/53 (08/09 0900) SpO2:  [88 %-100 %] 96 % (08/09 0900) Arterial Line BP: (119-179)/(39-56) 171/56 (08/09 0900) FiO2 (%):  [40 %-60 %] 40 % (08/09 0745) Weight:  [115.9 kg] 115.9 kg (08/09 0900)  Intake/Output from previous day: 08/08 0701 - 08/09 0700 In: 3245.3 [I.V.:1754.6; NG/GT:1224; IV Piggyback:216.7] Out: 4040 [Urine:250; Drains:365] Intake/Output this shift: Total I/O In: 295 [I.V.:131.6; NG/GT:130; IV Piggyback:33.4] Out: 138 [Other:138]  Eyes closed, sedated on Fentanyl, propofol Minimal movement to stimulation Flank incision soft, appears to be healing well Back incision inspected.  No redness, erythema, or drainage Drains with serous output, removed in sterile fashion Some creamy discharge from his urethra around his Foley  Lab Results: Recent Labs    12/17/20 0500 12/17/20 0536 12/17/20 0810 12/18/20 0430  WBC 9.2  --   --  6.5  HGB 7.8*   < > 7.5* 7.0*  HCT 23.1*   < > 22.0* 21.0*  PLT 130*  --   --  109*   < > = values in this interval not displayed.   BMET Recent Labs    12/17/20 1554 12/18/20 0430  NA 130* 131*  K 4.7 4.3  CL 99 98  CO2 22 23  GLUCOSE 105* 145*  BUN 40* 33*  CREATININE 4.12* 3.37*  CALCIUM 7.9* 7.8*    Studies/Results: CT HEAD WO CONTRAST (5MM)  Result Date: 12/18/2020 CLINICAL DATA:  Stroke.  Follow-up. EXAM: CT HEAD WITHOUT CONTRAST TECHNIQUE: Contiguous axial images were obtained from the base of the skull through the vertex without intravenous contrast. COMPARISON:  Head CT 2 days ago. FINDINGS: Brain: Subacute infarction in the left thalamus appears similar, without swelling or hemorrhage. No definite cerebellar infarction is confirmed. Small focal low densities in the bilateral parietal and  occipital subcortical white matter and cortical brain are more noticeable. No large confluent infarction is identified. MRI would be useful to more completely understand. No evidence of hemorrhage or shift. No hydrocephalus or extra-axial collection. Vascular: There is atherosclerotic calcification of the major vessels at the base of the brain. Skull: Negative Sinuses/Orbits: Clear/normal Other: Right scalp sebaceous cyst as seen previously. 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 Redemonstration of small cortical and subcortical infarctions in the parietal and occipital regions without evidence of hemorrhage or mass effect. Electronically Signed   By: Nelson Chimes M.D.   On: 12/18/2020 07:31   CT HEAD WO CONTRAST (5MM)  Result Date: 12/16/2020 CLINICAL DATA:  Mental status change EXAM: CT HEAD WITHOUT CONTRAST TECHNIQUE: Contiguous axial images were obtained from the base of the skull through the vertex without intravenous contrast. COMPARISON:  09/11/2020 FINDINGS: Brain: Small acute appearing infarcts seen along the bilateral cerebral cortex, including right frontal, right occipital, and left parietal. Subcortical infarcts may have also occurred and there is also a new left thalamic lacunar infarct. Suspect small bilateral cerebellar infarcts, limitation in the posterior fossa and ventral cerebrum due to streak artifact/motion. No hemorrhage, hydrocephalus, or masslike finding. Vascular: No detected hyperdense vessel. Skull: Negative Sinuses/Orbits: Negative Dermal inclusion cyst along the right temporal scalp. Other: To expedite communication, these results will be called to the ordering clinician or representative/RN by the Radiologist Assistant, and communication documented in the PACS or [place]  Dashboard. IMPRESSION: Small recent appearing bilateral cortical infarcts, left thalamic and probable bilateral cerebellar infarcts. The multifocality  suggests embolic disease. Electronically Signed   By: [name] M.D.   On: 12/16/2020 10:42   DG Chest Port 1 View  Result Date: 12/16/2020 CLINICAL DATA:  Ventilator dyssynchrony EXAM: PORTABLE CHEST 1 VIEW COMPARISON:  12/14/2020, 12/13/2020 FINDINGS: Endotracheal tube tip is about 2.8 cm superior to carina. Esophageal tube tip below the diaphragm but incompletely visualized. Right IJ central venous catheter tip over the SVC. Low lung volumes. Normal cardiac size. Interim development of hazy and streaky perihilar opacity with patchy right lower lung airspace disease. No pneumothorax IMPRESSION: 1. Endotracheal tube tip about 2.8 cm superior to carina 2. Low lung volumes. Interim development of streaky and hazy perihilar opacity which may be due to vascular congestion versus atypical infection. There is patchy airspace disease in the right lower lung Electronically Signed   By: Donavan Foil M.D.   On: 12/16/2020 22:33   EEG adult  Result Date: 12/16/2020 Derek Jack, MD     12/16/2020  3:29 PM Routine EEG Report CAELAN MORIARITY is a 60 y.o. male with a history of encephalopathy who is undergoing an EEG to evaluate for seizures. Report: This EEG was acquired with electrodes placed according to the International 10-20 electrode system (including Fp1, Fp2, F3, F4, C3, C4, P3, P4, O1, O2, T3, T4, T5, T6, A1, A2, Fz, Cz, Pz). The following electrodes were missing or displaced: none. Best background was composed of frequencies of 4-6 Hz. This activity is reactive to stimulation. No sleep architecture was identified. There was no focal slowing. There were no interictal epileptiform discharges. There were no electrographic seizures identified. Photic stimulation and hyperventilation were not performed. Impression and clinical correlation: This EEG was obtained while unresponsive and is abnormal due to moderate diffuse slowing indicative of global cerebral dysfunction.   Su Monks, MD [facility] [phone] If 7pm- 7am, please page neurology on call as listed in Central.   Overnight EEG with video  Result Date: 12/16/2020 Lora Havens, MD     12/18/2020  8:11 AM Patient Name: DEJAN CARNEVALE MRN: [id number] Epilepsy Attending: Lora Havens Referring Physician/Provider: Claiborne Billings, [place] Duration: 12/16/2020 1436 to 12/17/2020 1436 Patient history:  60 y.o. male with a history of encephalopathy who is undergoing an EEG to evaluate for seizures Level of alertness: lethargic AEDs during EEG study: propofol, LEV, LTG, GBP Technical aspects: This EEG study was done with scalp electrodes positioned according to the 10-20 International system of electrode placement. Electrical activity was acquired at a sampling rate of '500Hz'$  and reviewed with a high frequency filter of '70Hz'$  and a low frequency filter of '1Hz'$ . EEG data were recorded continuously and digitally stored. Description: No posterior dominant rhythm was seen.  EEG initially showed continuous generalized 3 to 6 Hz theta-delta slowing. On 12/16/2020 after around 1830, EEG showed bursts suppression pattern with bursts of 4-'6hz'$  theta-delta slowing lasting 2-3 seconds alternating with eeg suppression lasting 8-10 seconds. Hyperventilation and photic stimulation were not performed.   EKG, artifact, significant myogenic artifact and electrode artifact was seen throughout the study. ABNORMALITY - Continuous slow, generalized - Burst suppression, generalized IMPRESSION: This technically difficult study was initially suggestive of moderate diffuse encephalopathy, nonspecific etiology. After around 1830 on 12/16/2020, eeg was suggestive of profound diffuse encephalopathy, likely due to sedation. No seizures or epileptiform discharges were seen throughout the recording. Lora Havens    Assessment/Plan: 60  yo s/p reconstructive thoracolumbar spine surgery - we can begin heparin gtt today with no bolus, with PTT goal 50-70, liberalized to  56-85 on 8/13 - I am contacting urology re: penile discharge after his dilatation procedure - although he has been afebrile, will check blood cultures given his pancytopenia and his urologic procedure, specifically to r/o gram negative sepsis  - he remains in critical condition   Vallarie Mare 12/18/2020, 9:59 AM

## 2020-12-19 ENCOUNTER — Inpatient Hospital Stay (HOSPITAL_COMMUNITY): Payer: No Typology Code available for payment source

## 2020-12-19 DIAGNOSIS — J95859 Other complication of respirator [ventilator]: Secondary | ICD-10-CM | POA: Diagnosis not present

## 2020-12-19 LAB — RENAL FUNCTION PANEL
Albumin: 1.7 g/dL — ABNORMAL LOW (ref 3.5–5.0)
Anion gap: 7 (ref 5–15)
BUN: 32 mg/dL — ABNORMAL HIGH (ref 6–20)
CO2: 26 mmol/L (ref 22–32)
Calcium: 8.1 mg/dL — ABNORMAL LOW (ref 8.9–10.3)
Chloride: 101 mmol/L (ref 98–111)
Creatinine, Ser: 2.65 mg/dL — ABNORMAL HIGH (ref 0.61–1.24)
GFR, Estimated: 27 mL/min — ABNORMAL LOW (ref >60)
Glucose, Bld: 116 mg/dL — ABNORMAL HIGH (ref 70–99)
Phosphorus: 2.6 mg/dL (ref 2.5–4.6)
Potassium: 4.7 mmol/L (ref 3.5–5.1)
Sodium: 134 mmol/L — ABNORMAL LOW (ref 135–145)

## 2020-12-19 LAB — CBC WITH DIFFERENTIAL/PLATELET
Abs Immature Granulocytes: 0.27 10*3/uL — ABNORMAL HIGH (ref 0.00–0.07)
Basophils Absolute: 0 10*3/uL (ref 0.0–0.1)
Basophils Relative: 0 %
Eosinophils Absolute: 0.2 10*3/uL (ref 0.0–0.5)
Eosinophils Relative: 3 %
HCT: 21.7 % — ABNORMAL LOW (ref 39.0–52.0)
Hemoglobin: 7.2 g/dL — ABNORMAL LOW (ref 13.0–17.0)
Immature Granulocytes: 4 %
Lymphocytes Relative: 10 %
Lymphs Abs: 0.7 10*3/uL (ref 0.7–4.0)
MCH: 27.6 pg (ref 26.0–34.0)
MCHC: 33.2 g/dL (ref 30.0–36.0)
MCV: 83.1 fL (ref 80.0–100.0)
Monocytes Absolute: 0.7 10*3/uL (ref 0.1–1.0)
Monocytes Relative: 10 %
Neutro Abs: 5.2 10*3/uL (ref 1.7–7.7)
Neutrophils Relative %: 73 %
Platelets: 132 10*3/uL — ABNORMAL LOW (ref 150–400)
RBC: 2.61 MIL/uL — ABNORMAL LOW (ref 4.22–5.81)
RDW: 16.9 % — ABNORMAL HIGH (ref 11.5–15.5)
WBC: 7.1 10*3/uL (ref 4.0–10.5)
nRBC: 0 % (ref 0.0–0.2)

## 2020-12-19 LAB — POCT I-STAT 7, (LYTES, BLD GAS, ICA,H+H)
Acid-Base Excess: 6 mmol/L — ABNORMAL HIGH (ref 0.0–2.0)
Bicarbonate: 29.7 mmol/L — ABNORMAL HIGH (ref 20.0–28.0)
Calcium, Ion: 0.93 mmol/L — ABNORMAL LOW (ref 1.15–1.40)
HCT: 18 % — ABNORMAL LOW (ref 39.0–52.0)
Hemoglobin: 6.1 g/dL — CL (ref 13.0–17.0)
O2 Saturation: 97 %
Potassium: 4.8 mmol/L (ref 3.5–5.1)
Sodium: 133 mmol/L — ABNORMAL LOW (ref 135–145)
TCO2: 31 mmol/L (ref 22–32)
pCO2 arterial: 40.5 mmHg (ref 32.0–48.0)
pH, Arterial: 7.474 — ABNORMAL HIGH (ref 7.350–7.450)
pO2, Arterial: 89 mmHg (ref 83.0–108.0)

## 2020-12-19 LAB — PREPARE RBC (CROSSMATCH)

## 2020-12-19 LAB — APTT
aPTT: 43 seconds — ABNORMAL HIGH (ref 24–36)
aPTT: 41 seconds — ABNORMAL HIGH (ref 24–36)

## 2020-12-19 LAB — COMPREHENSIVE METABOLIC PANEL
ALT: 9 U/L (ref 0–44)
AST: 79 U/L — ABNORMAL HIGH (ref 15–41)
Albumin: 1.6 g/dL — ABNORMAL LOW (ref 3.5–5.0)
Alkaline Phosphatase: 294 U/L — ABNORMAL HIGH (ref 38–126)
Anion gap: 7 (ref 5–15)
BUN: 31 mg/dL — ABNORMAL HIGH (ref 6–20)
CO2: 26 mmol/L (ref 22–32)
Calcium: 8 mg/dL — ABNORMAL LOW (ref 8.9–10.3)
Chloride: 102 mmol/L (ref 98–111)
Creatinine, Ser: 2.83 mg/dL — ABNORMAL HIGH (ref 0.61–1.24)
GFR, Estimated: 25 mL/min — ABNORMAL LOW (ref >60)
Glucose, Bld: 121 mg/dL — ABNORMAL HIGH (ref 70–99)
Potassium: 4.6 mmol/L (ref 3.5–5.1)
Sodium: 135 mmol/L (ref 135–145)
Total Bilirubin: 0.7 mg/dL (ref 0.3–1.2)
Total Protein: 5 g/dL — ABNORMAL LOW (ref 6.5–8.1)

## 2020-12-19 LAB — CBC
HCT: 27.2 % — ABNORMAL LOW (ref 39.0–52.0)
Hemoglobin: 9.3 g/dL — ABNORMAL LOW (ref 13.0–17.0)
MCH: 28.4 pg (ref 26.0–34.0)
MCHC: 34.2 g/dL (ref 30.0–36.0)
MCV: 83.2 fL (ref 80.0–100.0)
Platelets: 170 10*3/uL (ref 150–400)
RBC: 3.27 MIL/uL — ABNORMAL LOW (ref 4.22–5.81)
RDW: 16.3 % — ABNORMAL HIGH (ref 11.5–15.5)
WBC: 8.8 10*3/uL (ref 4.0–10.5)
nRBC: 0 % (ref 0.0–0.2)

## 2020-12-19 LAB — PHOSPHORUS: Phosphorus: 2.6 mg/dL (ref 2.5–4.6)

## 2020-12-19 LAB — GLUCOSE, CAPILLARY
Glucose-Capillary: 101 mg/dL — ABNORMAL HIGH (ref 70–99)
Glucose-Capillary: 114 mg/dL — ABNORMAL HIGH (ref 70–99)
Glucose-Capillary: 128 mg/dL — ABNORMAL HIGH (ref 70–99)
Glucose-Capillary: 126 mg/dL — ABNORMAL HIGH (ref 70–99)
Glucose-Capillary: 114 mg/dL — ABNORMAL HIGH (ref 70–99)

## 2020-12-19 LAB — SARS CORONAVIRUS 2 (TAT 6-24 HRS): SARS Coronavirus 2: NEGATIVE

## 2020-12-19 LAB — MAGNESIUM: Magnesium: 2.6 mg/dL — ABNORMAL HIGH (ref 1.7–2.4)

## 2020-12-19 LAB — TRIGLYCERIDES: Triglycerides: 247 mg/dL — ABNORMAL HIGH (ref <150)

## 2020-12-19 MED ORDER — SODIUM CHLORIDE 0.9% IV SOLUTION: Freq: Once | INTRAVENOUS | Status: AC

## 2020-12-19 MED ORDER — PHENYLEPHRINE HCL-NACL 20-0.9 MG/250ML-% IV SOLN
INTRAVENOUS | Status: AC
  Filled 2020-12-19: qty 750

## 2020-12-19 MED ORDER — BISACODYL 10 MG RE SUPP
10.0000 mg | Freq: Once | RECTAL | Status: AC
  Administered 2020-12-19: 10 mg via RECTAL
  Filled 2020-12-19: qty 1

## 2020-12-19 MED ORDER — LABETALOL HCL 5 MG/ML IV SOLN
10.0000 mg | INTRAVENOUS | Status: DC | PRN
  Administered 2020-12-19 – 2021-01-26 (×17): 10 mg via INTRAVENOUS
  Filled 2020-12-19 (×16): qty 4

## 2020-12-19 MED ORDER — PROPOFOL 1000 MG/100ML IV EMUL
INTRAVENOUS | Status: AC
  Filled 2020-12-19: qty 400

## 2020-12-19 MED FILL — Heparin Sodium (Porcine) Inj 1000 Unit/ML: INTRAMUSCULAR | Qty: 30 | Status: AC

## 2020-12-19 MED FILL — Sodium Chloride IV Soln 0.9%: INTRAVENOUS | Qty: 2000 | Status: AC

## 2020-12-19 NOTE — Progress Notes (Signed)
[place] for heparin Indication: DVT  No Known Allergies  Patient Measurements: Height: 6' (182.9 cm) Weight: 115 kg (253 lb 8.5 oz) IBW/kg (Calculated) : 77.6  Vital Signs: Temp: 98.2 F (36.8 C) (08/10 0620) Temp Source: Oral (08/10 0620) BP: 100/54 (08/10 0630) Pulse Rate: 94 (08/10 0630)  Labs: Recent Labs    12/16/20 1042 12/16/20 1623 12/18/20 0430 12/18/20 1009 12/18/20 1600 12/18/20 1744 12/18/20 2033 12/19/20 0327 12/19/20 0518  HGB  --    < > 7.0*  --   --  7.3*  --  6.1* 7.2*  HCT  --    < > 21.0*  --   --  22.0*  --  18.0* 21.7*  PLT 82*   < > 109*  --   --  128*  --   --  132*  APTT 31  --   --  31  --   --  38*  --  43*  LABPROT 15.7*  --   --   --   --   --   --   --   --   INR 1.3*  --   --   --   --   --   --   --   --   CREATININE  --    < > 3.37*  --  3.13*  --   --   --  2.83*   < > = values in this interval not displayed.     Estimated Creatinine Clearance: 37.3 mL/min (A) (by C-G formula based on SCr of 2.83 mg/dL (H)).   Medical History: Past Medical History:  Diagnosis Date   Anxiety    Arthritis    Bipolar 1 disorder, depressed ([place])    Bipolar disorder ([place])    Depression    History of posttraumatic stress disorder (PTSD)    Hypertension    PTSD (post-traumatic stress disorder)    Seizures ([place])    Sleep apnea    Smoker    Tobacco use disorder     Medications:  Medications Prior to Admission  Medication Sig Dispense Refill Last Dose   acetaminophen (TYLENOL) 500 MG tablet Take 2 tablets (1,000 mg total) by mouth every 6 (six) hours as needed for mild pain. 30 tablet 0 12/12/2020 at 0500   amLODipine (NORVASC) 10 MG tablet Take 10 mg by mouth in the morning.   12/12/2020 at 0500   baclofen (LIORESAL) 10 MG tablet Take 10 mg by mouth 2 (two) times daily.   12/12/2020 at 0500   Cholecalciferol (VITAMIN D3) 50 MCG (2000 UT) TABS Take 2,000 Units by mouth in the morning and at bedtime.   Past Week    gabapentin (NEURONTIN) 300 MG capsule Take 900 mg by mouth 2 (two) times daily.   12/12/2020 at 0500   hydrOXYzine (VISTARIL) 50 MG capsule Take 50 mg by mouth in the morning and at bedtime.   12/12/2020 at 0500   lamoTRIgine (LAMICTAL) 100 MG tablet Take 1 tablet (100 mg total) by mouth 2 (two) times daily. 60 tablet 2 12/12/2020 at 0500   levETIRAcetam (KEPPRA) 750 MG tablet Take 1,500 mg by mouth 2 (two) times daily.   12/12/2020 at 0500   lisinopril-hydrochlorothiazide (ZESTORETIC) 20-12.5 MG tablet Take 1 tablet by mouth in the morning.   12/11/2020   meloxicam (MOBIC) 15 MG tablet Take 15 mg by mouth daily after breakfast.   12/11/2020   QUEtiapine (SEROQUEL) 200 MG tablet Take  200 mg by mouth at bedtime.   12/11/2020   sertraline (ZOLOFT) 100 MG tablet Take 100 mg by mouth daily after breakfast.   12/12/2020 at 0500    Assessment: 54 YOM s/p interbody fusion surgery developed a RLE DVT. Anticoagulation was initially held due to bleeding risk and an IVC filter was placed. Pharmacy now consulted to start IV heparin.   Of note, patient is on IV heparin at 750 units/hr via the CRRT circuit. Repeat aPTT has continued to trend up but remains subtherapeutic at 43. Hgb dropped to 6.1 overnight. Transfused 1 unit PRBC and repeat Hgb is up to 7.2 today. Discussed with neurosurgery who is okay with continuing heparin. No overt s/s of bleeding anywhere per RN   Goal of Therapy:  aPTT 50-70 seconds. On 8/13, liberalize to 56-85 Monitor platelets by anticoagulation protocol: Yes   Plan:  -Increase heparin to 1200 units/h -Recheck aPTT in 8h and monitor for s/s of bleeding   Albertina Parr, PharmD., BCPS, BCCCP Clinical Pharmacist Please refer to [facility] for unit-specific pharmacist

## 2020-12-19 NOTE — Progress Notes (Signed)
NAME:  Keith Dixon, MRN:  [id number], DOB:  1961-03-18, LOS: 7 ADMISSION DATE:  12/12/2020, CONSULTATION DATE:  12/19/20  REFERRING MD:  Grandville Silos, CHIEF COMPLAINT:   respiratory depression  History of Present Illness:  Keith Dixon is a 60 y/o M with PMH significant for severe degenerative scoliosis with multi-level listhesis, anxiety, bipolar disorder, PTSD, seizures, OSA and tobacco use who was admitted to neurosurgery for L1-2, L2-3, L3-4, L4-5 DLIFs initially and subsequent T10-pelvis decompression and fusion.   His initial surgery was 8/3 and was slow to wake up, after second  surgery 8/4  (10hr procedure) pt was again slow to awaken post-anesthesia, so decision was made to leave patient intubated overnight.  He was transfused two units PRBC intra-operatively.   Significant Hospital Events:  8/3 Admit to neurosurgery, underwent  anterior lateral Interbody fusion of L1-L5, notably had difficult foley insertion requiring urethral dilation 8/4 Stage 2 posterior decompression and fusion, left intubated post-procedure and PCCM consulted 8/5 AKI seen on am labs with evidence of volume depletion on POC [place] 8/6: Patient AKI was due to acute rhabdomyolysis in the setting of prolonged surgery, he remained oliguric.  Ultrasound lower extremity showed acute right lower extremity DVT and left IJ DVT, IVC filter placed 8/7: Patient serum creatinine continues to trend up, he remained oliguric, started on CRRT Head CT showed small multiple bilateral strokes.  EEG was placed  8/9>. No seizures per EEG, remains on Levo, Propofol and fentanyl with good vent synchrony. Remains on CVVH with UF goal of 50 cc per hour, but increasing pressor needs this am   Interim History / Subjective:   Weaned off vasopressors, tolerating UF 345m/h. Tremulousness when sedation stopped.    Objective   Blood pressure 130/63, pulse 90, temperature 98.4 F (36.9 C), temperature source Axillary, resp. rate (!) 28, height 6'  (1.829 m), weight 115 kg, SpO2 97 %.    Vent Mode: PRVC FiO2 (%):  [40 %-60 %] 40 % Set Rate:  [28 bmp] 28 bmp Vt Set:  [620 mL] 620 mL PEEP:  [5 cmH20] 5 cmH20 Plateau Pressure:  [18 cmH20-21 cmH20] 19 cmH20   Intake/Output Summary (Last 24 hours) at 12/19/2020 1126 Last data filed at 12/19/2020 1114 Gross per 24 hour  Intake 4217.7 ml  Output 5531 ml  Net -1313.3 ml    Filed Weights   12/16/20 0500 12/18/20 0900 12/19/20 0348  Weight: 101.7 kg 115.9 kg 115 kg   Physical Exam  General: Critically ill-appearing middle-aged African-American male, lying in the bed, orally intubated  HEENT: Atraumatic, normocephalic ETT, MM pink/moist, PERRL,  Neuro:  sedated, opening eyes weakly, but no response to pain.  CV: s1s2 regular rate and rhythm, no murmur, rubs, or gallops,  PULM:  Bilateral chest excursion, Diminished bilaterally, no added breath sounds, no increased work of breathing,  GI: soft, bowel sounds active in all 4 quadrants, non-tender, non-distended Extremities: warm/dry, no edema , Cap refill < 3 seconds, No obvious deformities Skin: no rashes or lesions   Resolved Hospital Problem list     Assessment & Plan:  Acute respiratory failure with hypoxia and hypercapnia due to post-op respiratory depression Acute rhabdomyolysis in the setting of prolonged surgery and hypotension due to blood loss during surgery Oliguric acute renal failure due to rhabdomyolysis and ischemic ATN due to hypotension during surgery Anemia of acute illness.  Acute bilateral multiple strokes Lumbar spinal stenosis status post decompression and fusion surgery Acute right lower extremity DVT, acute left IJ DVT  Acute metabolic encephalopathy Seizures disorder Hx of Bipolar 1 disorder  Tobacco dependence  Plan:  - Continue aggressive fluid removal today with plan to start SBT tomorrow.  - Foley to remain in as placed by urology.  - Stop sedation tomorrow and SBT.  - Continue heparin. If HB  fails to increase appropriately, will stop heparin.   Best Practice    Diet/type: NPO DVT prophylaxis: SCD GI prophylaxis: PPI Lines: Yes, still needed Foley: Yes, still needed Code Status:  full code Last date of multidisciplinary goals of care discussion: 8/8 patient's family was updated at bedside  CRITICAL CARE Performed by: [name]  Total critical care time: 40 minutes  Critical care time was exclusive of separately billable procedures and treating other patients.  Critical care was necessary to treat or prevent imminent or life-threatening deterioration.  Critical care was time spent personally by me on the following activities: development of treatment plan with patient and/or surrogate as well as nursing, discussions with consultants, evaluation of patient's response to treatment, examination of patient, obtaining history from patient or surrogate, ordering and performing treatments and interventions, ordering and review of laboratory studies, ordering and review of radiographic studies, pulse oximetry, re-evaluation of patient's condition and participation in multidisciplinary rounds.  [name], MD [facility] ICU Physician [place]  Pager: [phone] Mobile: [phone] After hours: [phone].  12/19/2020

## 2020-12-19 NOTE — Progress Notes (Signed)
Patient ID: Keith Dixon, male   DOB: 1960-09-04, 60 y.o.   MRN: [id number] [facility] Progress Note   Assessment/ Plan:   1.  Acute kidney injury: Appears to be from a combination of ATN and rhabdomyolysis/pigment nephropathy.  He has dense renal injury.  He was started on CRRT on 8/7 for correction of multiple metabolic abnormalities and volume unloading.  Note that he also is charted as getting IV contrast on 8/7 - Continue CRRT - continue UF goal of net negative 100 ml/hr as tolerated  - heparin in filter - defer further increase for now - his team added systemic heparin   2.  Hyponatremia: Secondary to free water handling defect in the setting of acute kidney injury; Improved with CRRT  3.  Acute postoperative hypoxic/hypercapnic respiratory failure: Intubated and on ventilator support per CCM.  4.  Lumbar spine stenosis/scoliosis status post decompression and fusion surgery: Ongoing management/surveillance by neurosurgery.  5.  Shock: Secondary to distributive/hypovolemic mechanism.  Status post intravenous fluids and pressors per primary.  Now off of pressors.  6.  Acute blood loss anemia: Secondary to surgical associated losses, ongoing PRBC transfusion for continued downtrend of hemoglobin and hematocrit.  No ESA for now with acute CVA.  PRBC's on 8/9 and again on 8/10.  Give additional unit on 8/10 and may need to stop systemic heparin - defer to team  7.  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  Subjective:   He had 100 ml uop over 8/9.  He has been on CRRT and had 4.7 liters UF over 8/9 with CRRT.  He has been at net negative 148m/hr as critical care had requested to increase his UF goal.  He has been off of levo.  Sedation came down a bit this am.   Review of systems:   Unable to obtain 2/2 mechanical ventilation    Objective:   BP (!) 123/59   Pulse 94   Temp 98.3 F (36.8 C) (Oral)   Resp (!) 28   Ht 6'  (1.829 m)   Wt 115 kg   SpO2 99%   BMI 34.38 kg/m   Intake/Output Summary (Last 24 hours) at 12/19/2020 0941 Last data filed at 12/19/2020 0800 Gross per 24 hour  Intake 4004.25 ml  Output 4969 ml  Net -964.75 ml   Weight change:   Physical Exam:  General adult male in bed intubated HEENT normocephalic atraumatic Neck trachea midline Lungs coarse mechanical breath sounds Heart S1S2 no rub Abdomen soft nontender with sedation nondistended Extremities 1+ edema lower extremities Neuro - continuous sedation running Access: left femoral nontunneled dialysis catheter   Imaging: CT HEAD WO CONTRAST (5MM)  Result Date: 12/18/2020 CLINICAL DATA:  Stroke.  Follow-up. EXAM: CT HEAD WITHOUT CONTRAST TECHNIQUE: Contiguous axial images were obtained from the base of the skull through the vertex without intravenous contrast. COMPARISON:  Head CT 2 days ago. FINDINGS: Brain: Subacute infarction in the left thalamus appears similar, without swelling or hemorrhage. No definite cerebellar infarction is confirmed. Small focal low densities in the bilateral parietal and occipital subcortical white matter and cortical brain are more noticeable. No large confluent infarction is identified. MRI would be useful to more completely understand. No evidence of hemorrhage or shift. No hydrocephalus or extra-axial collection. Vascular: There is atherosclerotic calcification of the major vessels at the base of the brain. Skull: Negative Sinuses/Orbits: Clear/normal Other: Right scalp sebaceous cyst as seen previously. IMPRESSION: Redemonstration of a subacute left  thalamic infarction, without mass effect or hemorrhage. Redemonstration of small cortical and subcortical infarctions in the parietal and occipital regions without evidence of hemorrhage or mass effect. Electronically Signed   By: Nelson Chimes M.D.   On: 12/18/2020 07:31   DG CHEST PORT 1 VIEW  Result Date: 12/19/2020 CLINICAL DATA:  Respiratory failure  EXAM: PORTABLE CHEST 1 VIEW COMPARISON:  Radiograph 12/16/2020 FINDINGS: Endotracheal tube tip terminates 5 cm from the carina. Transesophageal tube tip terminates in the left upper quadrant with side port just distal to the GE junction. Right IJ approach central venous catheter tip terminates in the lower SVC near the superior cavoatrial junction. Multiple skin staples project over midline with evidence of lumbar fusion hardware, incompletely assessed on this exam. Telemetry leads and external support devices overlie the chest. Lung volumes remain diminished with increasing perihilar opacities specially confluent in the right perihilar and retrocardiac space. No pneumothorax or visible layering effusion. Stable cardiomediastinal contours accounting for differences in technique. No other acute osseous or soft tissue abnormality of the chest wall. IMPRESSION: 1. Diminishing lung volumes with increasing opacities in the perihilar and retrocardiac space. Could reflect worsening atelectasis, edema or developing airspace disease. 2. Support devices in stable, satisfactory positioning as above. Electronically Signed   By: Lovena Le M.D.   On: 12/19/2020 03:39   VAS [place] TRANSCRANIAL DOPPLER W BUBBLES  Result Date: 12/18/2020  Transcranial Doppler with Bubble Patient Name:  Keith Dixon  Date of Exam:   12/18/2020 Medical Rec #: [id number]         Accession #:    [id number] Date of Birth: [birth year]          Patient Gender: M Patient Age:   60 years Exam Location:  [facility] Procedure:      VAS [place] TRANSCRANIAL [place] Referring Phys: Cornelius Moras XU --------------------------------------------------------------------------------  Indications: Stroke. Comparison Study: No prior studies. Performing Technologist: Darlin Coco RDMS,RVT  Examination Guidelines: A complete evaluation includes B-mode imaging, spectral Doppler, color Doppler, and power Doppler as needed of all accessible portions of each vessel.  Bilateral testing is considered an integral part of a complete examination. Limited examinations for reoccurring indications may be performed as noted.  Summary: No HITS at rest or during Valsalva. Negative transcranial Doppler Bubble study with no evidence of right to left intracardiac communication.  A vascular evaluation was performed. The left middle cerebral artery was studied. An IV was inserted into the patient's left forearm. Verbal informed consent was obtained.  *See table(s) above for TCD measurements and observations.    Preliminary    VAS US CAROTID  Result Date: 12/18/2020 Carotid Arterial Duplex Study Patient Name:  WILIAN KWONG  Date of Exam:   12/18/2020 Medical Rec #: [id number]         Accession #:    [id number] Date of Birth: [birth year]          Patient Gender: M Patient Age:   60 years Exam Location:  [facility] Procedure:      VAS US CAROTID Referring Phys: Alferd Patee [facility] --------------------------------------------------------------------------------  Indications:       CVA. Risk Factors:      Hypertension, current smoker. Limitations        Today's exam was limited due to central venous catheter right                    jugular. Comparison Study:  No prior studies. Performing Technologist: Darlin Coco RDMS,RVT  Examination  Guidelines: A complete evaluation includes B-mode imaging, spectral Doppler, color Doppler, and power Doppler as needed of all accessible portions of each vessel. Bilateral testing is considered an integral part of a complete examination. Limited examinations for reoccurring indications may be performed as noted.  Left Carotid Findings: +----------+--------+--------+--------+------------------+---------------------+           PSV cm/sEDV cm/sStenosisPlaque DescriptionComments              +----------+--------+--------+--------+------------------+---------------------+ CCA Prox  226     33                                Intimal thickening,                                                        Elevated velocities                                                       without evidence of                                                       focal stenosis        +----------+--------+--------+--------+------------------+---------------------+ CCA Distal184     22                                                      +----------+--------+--------+--------+------------------+---------------------+ ICA Prox  93      38      1-39%   heterogenous                            +----------+--------+--------+--------+------------------+---------------------+ ICA Distal145     48                                tortuous              +----------+--------+--------+--------+------------------+---------------------+ ECA       113     18                                                      +----------+--------+--------+--------+------------------+---------------------+ +----------+--------+--------+-----------------------------+-------------------+           PSV cm/sEDV cm/sDescribe                     Arm Pressure (mmHG) +----------+--------+--------+-----------------------------+-------------------+ [id number]             Elevated velocities without  evidence of focal stenosis                       +----------+--------+--------+-----------------------------+-------------------+ +---------+--------+--+--------+--+---------+ VertebralPSV cm/s87EDV cm/s25Antegrade +---------+--------+--+--------+--+---------+   Summary:  Left Carotid: Velocities in the left ICA are consistent with a 1-39% stenosis. Vertebrals:  Left vertebral artery demonstrates antegrade flow. Subclavians: Normal flow hemodynamics were seen in the left subclavian artery. *See table(s) above for measurements and observations.     Preliminary    VAS [place] TRANSCRANIAL DOPPLER  Result Date:  12/18/2020  Transcranial Doppler Patient Name:  SHARMARKE CICIO  Date of Exam:   12/18/2020 Medical Rec #: [id number]         Accession #:    [id number] Date of Birth: 12/21/1960          Patient Gender: M Patient Age:   24 years Exam Location:  [facility] Procedure:      VAS [place] TRANSCRANIAL DOPPLER Referring Phys: Cornelius Moras XU --------------------------------------------------------------------------------  Indications: Stroke. Limitations: Central venous catheter RT jugular and collar. Comparison Study: No prior studies. Performing Technologist: Darlin Coco RDMS,RVT  Examination Guidelines: A complete evaluation includes B-mode imaging, spectral Doppler, color Doppler, and power Doppler as needed of all accessible portions of each vessel. Bilateral testing is considered an integral part of a complete examination. Limited examinations for reoccurring indications may be performed as noted.  +----------+-------------+---------+-----------+-------------------------------+ RIGHT TCD Right VM (cm)  Depth  Pulsatility            Comment                                      (cm)                                              +----------+-------------+---------+-----------+-------------------------------+ MCA           74.00                1.21                                    +----------+-------------+---------+-----------+-------------------------------+ ACA          -26.00                0.88                                    +----------+-------------+---------+-----------+-------------------------------+ Term ICA      45.00                0.97                                    +----------+-------------+---------+-----------+-------------------------------+ PCA           43.00                0.94                                    +----------+-------------+---------+-----------+-------------------------------+ Opthalmic     20.00  1.83                                     +----------+-------------+---------+-----------+-------------------------------+ ICA siphon    88.00                1.17                                    +----------+-------------+---------+-----------+-------------------------------+ Vertebral                                     Unable to insonate due to                                                           collar              +----------+-------------+---------+-----------+-------------------------------+ Distal ICA                                  Unable to insonate due to CVC  +----------+-------------+---------+-----------+-------------------------------+  +----------+------------+----------+-----------+------------------------------+ LEFT TCD  Left VM (cm)Depth (cm)Pulsatility           Comment             +----------+------------+----------+-----------+------------------------------+ MCA          105.00                1.25                                   +----------+------------+----------+-----------+------------------------------+ ACA          -47.00                0.79                                   +----------+------------+----------+-----------+------------------------------+ Term ICA     49.00                 1.18                                   +----------+------------+----------+-----------+------------------------------+ PCA          45.00                 1.21                                   +----------+------------+----------+-----------+------------------------------+ Opthalmic    25.00                 1.90                                   +----------+------------+----------+-----------+------------------------------+ ICA siphon   48.00  1.19                                   +----------+------------+----------+-----------+------------------------------+ Vertebral                                    Unable to insonate due to                                                            collar             +----------+------------+----------+-----------+------------------------------+ Distal ICA   47.00                 1.14                                   +----------+------------+----------+-----------+------------------------------+  +------------+-----+--------------------------------+             VM cm            Comment              +------------+-----+--------------------------------+ Prox Basilar     Unable to insonate due to collar +------------+-----+--------------------------------+ Dist Basilar     Unable to insonate due to collar +------------+-----+--------------------------------+ +----------------------+----------------+ Right Lindegaard RatioUnable to obtain +----------------------+----------------+ +---------------------+----+ Left Lindegaard Ratio2.23 +---------------------+----+    Preliminary     Labs: BMET Recent Labs  Lab 12/16/20 0509 12/16/20 1623 12/16/20 2149 12/17/20 0500 12/17/20 0536 12/17/20 0810 12/17/20 1554 12/18/20 0430 12/18/20 1600 12/19/20 0327 12/19/20 0518  NA 128* 127*   < > 130* 130* 129* 130* 131* 133* 133* 135  K 4.6 4.8   < > 5.2* 5.3* 5.3* 4.7 4.3 4.4 4.8 4.6  CL 98 98  --  99  --   --  99 98 102  --  102  CO2 19* 18*  --  20*  --   --  _0 --  26  GLUCOSE 124* 114*  --  130*  --   --  105* 145* 127*  --  121*  BUN 62* 66*  --  48*  --   --  40* 33* 32*  --  31*  CREATININE 6.63* 6.74*  --  4.91*  --   --  4.12* 3.37* 3.13*  --  2.83*  CALCIUM 7.2* 7.0*  --  7.6*  --   --  7.9* 7.8* 7.8*  --  8.0*  PHOS 5.4* 6.4*  --  5.7*  --   --  4.5 3.5 3.3  --  2.6   < > = values in this interval not displayed.   CBC Recent Labs  Lab 12/13/20 0740 12/13/20 1513 12/17/20 0500 12/17/20 0536 12/18/20 0430 12/18/20 1744 12/19/20 0327 12/19/20 0518  WBC 13.2*   < > 9.2  --  6.5 7.5  --  7.1  NEUTROABS 11.1*  --   --   --   --   --   --  5.2  HGB 13.4    < > 7.8*   < > 7.0* 7.3* 6.1* 7.2*  HCT 40.8   < > 23.1*   < >  21.0* 22.0* 18.0* 21.7*  MCV 81.0   < > 85.9  --  83.7 82.7  --  83.1  PLT 222   < > 130*  --  109* 128*  --  132*   < > = values in this interval not displayed.   Medications:     albuterol  2.5 mg Nebulization Q6H   chlorhexidine gluconate (MEDLINE KIT)  15 mL Mouth Rinse BID   Chlorhexidine Gluconate Cloth  6 each Topical Daily   cholecalciferol  2,000 Units Per Tube BID AC & HS   cyanocobalamin  1,000 mcg Intramuscular Daily   Followed by   Derrill Memo ON 12/23/2020] vitamin B-12  1,000 mcg Oral Daily   docusate  100 mg Per Tube BID   feeding supplement (PROSource TF)  90 mL Per Tube BID   folic acid  1 mg Intravenous Daily   gabapentin  300 mg Per Tube Q12H   guaiFENesin  10 mL Per Tube BID   lamoTRIgine  100 mg Per Tube BID   levETIRAcetam  1,000 mg Per Tube BID   mouth rinse  15 mL Mouth Rinse 10 times per day   pantoprazole sodium  40 mg Per Tube QHS   polyethylene glycol  17 g Per Tube Daily   QUEtiapine  200 mg Per Tube QHS   sertraline  100 mg Per Tube QPC breakfast   sodium chloride flush  3 mL Intravenous Q12H   [START ON 12/25/2020] thiamine injection  100 mg Intravenous Daily   Claudia Desanctis, MD 12/19/2020, 9:54 AM

## 2020-12-19 NOTE — Progress Notes (Addendum)
STROKE TEAM PROGRESS NOTE   INTERVAL HISTORY  Overnight, hemoglobin dropped to 6.1 for which he received 1 unit of PRBC.   ICU team is trending hemoglobin and will stop heparin depending on trend.   He remains intubated and sedated. ICU team plans for SBT tomorrow.   Vitals:   12/19/20 0700 12/19/20 0730 12/19/20 0734 12/19/20 0737  BP: (!) 103/53 123/65    Pulse: 91 89  95  Resp: (!) 23 (!) 28  (!) 25  Temp:      TempSrc:      SpO2: 100% 100% 100% 98%  Weight:      Height:       CBC:  Recent Labs  Lab 12/13/20 0740 12/13/20 1513 12/18/20 1744 12/19/20 0327 12/19/20 0518  WBC 13.2*   < > 7.5  --  7.1  NEUTROABS 11.1*  --   --   --  5.2  HGB 13.4   < > 7.3* 6.1* 7.2*  HCT 40.8   < > 22.0* 18.0* 21.7*  MCV 81.0   < > 82.7  --  83.1  PLT 222   < > 128*  --  132*   < > = values in this interval not displayed.   Basic Metabolic Panel:  Recent Labs  Lab 12/18/20 0430 12/18/20 1600 12/19/20 0327 12/19/20 0518  NA 131* 133* 133* 135  K 4.3 4.4 4.8 4.6  CL 98 102  --  102  CO2 23 24  --  26  GLUCOSE 145* 127*  --  121*  BUN 33* 32*  --  31*  CREATININE 3.37* 3.13*  --  2.83*  CALCIUM 7.8* 7.8*  --  8.0*  MG 2.7*  --   --  2.6*  PHOS 3.5 3.3  --  2.6   Lipid Panel:  Recent Labs  Lab 12/16/20 1623 12/17/20 0500 12/19/20 0518  CHOL 89  --   --   TRIG 235*   < > 247*  HDL 12*  --   --   CHOLHDL 7.4  --   --   VLDL 47*  --   --   LDLCALC 30  --   --    < > = values in this interval not displayed.   HgbA1c:  Recent Labs  Lab 12/17/20 0500  HGBA1C 5.8*    IMAGING past 24 hours DG CHEST PORT 1 VIEW  Result Date: 12/19/2020 CLINICAL DATA:  Respiratory failure EXAM: PORTABLE CHEST 1 VIEW COMPARISON:  Radiograph 12/16/2020 FINDINGS: Endotracheal tube tip terminates 5 cm from the carina. Transesophageal tube tip terminates in the left upper quadrant with side port just distal to the GE junction. Right IJ approach central venous catheter tip terminates in the  lower SVC near the superior cavoatrial junction. Multiple skin staples project over midline with evidence of lumbar fusion hardware, incompletely assessed on this exam. Telemetry leads and external support devices overlie the chest. Lung volumes remain diminished with increasing perihilar opacities specially confluent in the right perihilar and retrocardiac space. No pneumothorax or visible layering effusion. Stable cardiomediastinal contours accounting for differences in technique. No other acute osseous or soft tissue abnormality of the chest wall. IMPRESSION: 1. Diminishing lung volumes with increasing opacities in the perihilar and retrocardiac space. Could reflect worsening atelectasis, edema or developing airspace disease. 2. Support devices in stable, satisfactory positioning as above. Electronically Signed   By: Lovena Le M.D.   On: 12/19/2020 03:39   VAS [place] TRANSCRANIAL DOPPLER W BUBBLES  Result Date: 12/18/2020  Transcranial Doppler with Bubble Patient Name:  IASIAH DONNALLY  Date of Exam:   12/18/2020 Medical Rec #: [id number]         Accession #:    [id number] Date of Birth: [birth year]          Patient Gender: M Patient Age:   37 years Exam Location:  [facility] Procedure:      VAS [place] TRANSCRANIAL DOPPLER W BUBBLES Referring Phys: Cornelius Moras Amadeus Oyama --------------------------------------------------------------------------------  Indications: Stroke. Comparison Study: No prior studies. Performing Technologist: Darlin Coco RDMS,RVT  Examination Guidelines: A complete evaluation includes B-mode imaging, spectral Doppler, color Doppler, and power Doppler as needed of all accessible portions of each vessel. Bilateral testing is considered an integral part of a complete examination. Limited examinations for reoccurring indications may be performed as noted.  Summary: No HITS at rest or during Valsalva. Negative transcranial Doppler Bubble study with no evidence of right to left intracardiac communication.   A vascular evaluation was performed. The left middle cerebral artery was studied. An IV was inserted into the patient's left forearm. Verbal informed consent was obtained.  *See table(s) above for TCD measurements and observations.    Preliminary    VAS US CAROTID  Result Date: 12/18/2020 Carotid Arterial Duplex Study Patient Name:  ARIV GOTTE  Date of Exam:   12/18/2020 Medical Rec #: [id number]         Accession #:    [id number] Date of Birth: [birth year]          Patient Gender: M Patient Age:   25 years Exam Location:  [facility] Procedure:      VAS US CAROTID Referring Phys: Alferd Patee [facility] --------------------------------------------------------------------------------  Indications:       CVA. Risk Factors:      Hypertension, current smoker. Limitations        Today's exam was limited due to central venous catheter right                    jugular. Comparison Study:  No prior studies. Performing Technologist: Darlin Coco RDMS,RVT  Examination Guidelines: A complete evaluation includes B-mode imaging, spectral Doppler, color Doppler, and power Doppler as needed of all accessible portions of each vessel. Bilateral testing is considered an integral part of a complete examination. Limited examinations for reoccurring indications may be performed as noted.  Left Carotid Findings: +----------+--------+--------+--------+------------------+---------------------+           PSV cm/sEDV cm/sStenosisPlaque DescriptionComments              +----------+--------+--------+--------+------------------+---------------------+ CCA Prox  226     33                                Intimal thickening,                                                       Elevated velocities                                                       without evidence of  focal stenosis         +----------+--------+--------+--------+------------------+---------------------+ CCA Distal184     22                                                      +----------+--------+--------+--------+------------------+---------------------+ ICA Prox  93      38      1-39%   heterogenous                            +----------+--------+--------+--------+------------------+---------------------+ ICA Distal145     48                                tortuous              +----------+--------+--------+--------+------------------+---------------------+ ECA       113     18                                                      +----------+--------+--------+--------+------------------+---------------------+ +----------+--------+--------+-----------------------------+-------------------+           PSV cm/sEDV cm/sDescribe                     Arm Pressure (mmHG) +----------+--------+--------+-----------------------------+-------------------+ [id number]             Elevated velocities without                                                evidence of focal stenosis                       +----------+--------+--------+-----------------------------+-------------------+ +---------+--------+--+--------+--+---------+ VertebralPSV cm/s87EDV cm/s25Antegrade +---------+--------+--+--------+--+---------+   Summary:  Left Carotid: Velocities in the left ICA are consistent with a 1-39% stenosis. Vertebrals:  Left vertebral artery demonstrates antegrade flow. Subclavians: Normal flow hemodynamics were seen in the left subclavian artery. *See table(s) above for measurements and observations.     Preliminary    VAS [place] TRANSCRANIAL DOPPLER  Result Date: 12/18/2020  Transcranial Doppler Patient Name:  JACOBE SKURA  Date of Exam:   12/18/2020 Medical Rec #: [id number]         Accession #:    [id number] Date of Birth: March 08, 1961          Patient Gender: M Patient Age:   64 years Exam Location:   [facility] Procedure:      VAS [place] TRANSCRANIAL DOPPLER Referring Phys: Cornelius Moras Mialani Reicks --------------------------------------------------------------------------------  Indications: Stroke. Limitations: Central venous catheter RT jugular and collar. Comparison Study: No prior studies. Performing Technologist: Darlin Coco RDMS,RVT  Examination Guidelines: A complete evaluation includes B-mode imaging, spectral Doppler, color Doppler, and power Doppler as needed of all accessible portions of each vessel. Bilateral testing is considered an integral part of a complete examination. Limited examinations for reoccurring indications may be performed as noted.  +----------+-------------+---------+-----------+-------------------------------+ RIGHT TCD Right VM (cm)  Depth  Pulsatility            Comment                                      (  cm)                                              +----------+-------------+---------+-----------+-------------------------------+ MCA           74.00                1.21                                    +----------+-------------+---------+-----------+-------------------------------+ ACA          -26.00                0.88                                    +----------+-------------+---------+-----------+-------------------------------+ Term ICA      45.00                0.97                                    +----------+-------------+---------+-----------+-------------------------------+ PCA           43.00                0.94                                    +----------+-------------+---------+-----------+-------------------------------+ Opthalmic     20.00                1.83                                    +----------+-------------+---------+-----------+-------------------------------+ ICA siphon    88.00                1.17                                     +----------+-------------+---------+-----------+-------------------------------+ Vertebral                                     Unable to insonate due to                                                           collar              +----------+-------------+---------+-----------+-------------------------------+ Distal ICA                                  Unable to insonate due to CVC  +----------+-------------+---------+-----------+-------------------------------+  +----------+------------+----------+-----------+------------------------------+ LEFT TCD  Left VM (cm)Depth (cm)Pulsatility           Comment             +----------+------------+----------+-----------+------------------------------+  MCA          105.00                1.25                                   +----------+------------+----------+-----------+------------------------------+ ACA          -47.00                0.79                                   +----------+------------+----------+-----------+------------------------------+ Term ICA     49.00                 1.18                                   +----------+------------+----------+-----------+------------------------------+ PCA          45.00                 1.21                                   +----------+------------+----------+-----------+------------------------------+ Opthalmic    25.00                 1.90                                   +----------+------------+----------+-----------+------------------------------+ ICA siphon   48.00                 1.19                                   +----------+------------+----------+-----------+------------------------------+ Vertebral                                    Unable to insonate due to                                                           collar             +----------+------------+----------+-----------+------------------------------+ Distal ICA   47.00                  1.14                                   +----------+------------+----------+-----------+------------------------------+  +------------+-----+--------------------------------+             VM cm            Comment              +------------+-----+--------------------------------+ Prox Basilar     Unable to insonate due to collar +------------+-----+--------------------------------+ Dist Basilar     Unable to insonate due to collar +------------+-----+--------------------------------+ +----------------------+----------------+  Right Lindegaard RatioUnable to obtain +----------------------+----------------+ +---------------------+----+ Left Lindegaard Ratio2.23 +---------------------+----+    Preliminary      Physical Exam  Constitutional: Appears well-developed and well-nourished.  Eyes: Normal external eye and conjunctiva. HENT: Normocephalic, no lesions, without obvious abnormality.   Musculoskeletal-no joint tenderness, deformity or swelling Cardiovascular: Normal rate and regular rhythm.  Respiratory: Effort normal, non-labored breathing saturations WNL GI: Soft.  No distension. There is no tenderness.  Skin: WDI   Neuro:  Mental Status: Patient intubated/sedated Cranial Nerves: + weak corneals bilaterally, face appear symmetric in presence of ETT. Doll's eyes not present. No withdrawal to noxious stimuli.  + cough and Gag.     Assessment and Plan  Keith Dixon is a 60 y.o. male with PMH significant for PTSD, OSA, HTN, tobacco use, ?seizures, bipolar, degenerativse scoliosis who underwent extensive T10-pelvis decompression and fusion over 2 days(8/4 and 8/5) and difficulty waking up after surgery. Also complicated by Aki, rhabdomyolysis, DVT in RLE and left IJ s/p IVC filter and poor mentation/encephalopathy. Workup with CTH demonstrated embolic appearing strokes and neurology consulted.  #Stroke: bilateral cortical MCA/ACA watershed area and left  thalamic infarcts, concerning for perioperative hypotension in the setting of intracranial stenosis. Can not rule out cardioembolic source CT head Small recent appearing bilateral cortical infarcts, left thalamic and probable bilateral cerebellar infarcts.  MRI  unable d/t hardware and CRRT with multiple lines of meds Carotid Doppler w/LICA [id number] % stenosis, RICA not able to sonicate due to central line Prior CTA Head on 09/11/20 with no significant intracranial stenosis; are considering obtaining a CTA Head and Neck when Cr normalizes 2D Echo w/EF 65 to 70 %, LA normal in size  Bilateral Lower Extremity Ultrasound showed a right DVT- TCD with Bubble was negative for PFO  TCD preliminary read has been released, waiting for final ready  LDL 30 HgbA1c 5.8 VTE prophylaxis - heparin IV No antithrombotic prior to admission, now on heparin IV given DVT ( will potentially be paused given anemia)  Therapy recommendations:  pending  #Seizure History  LTM this admission did not show any seizures or epileptiform discharges. Continue with Lamictal 100 mg BID and Keppra 1000 mg BID.   #Hypertension Home meds:  norvasc Stable Permissive hypertension (OK if < 220/120) but gradually normalize in 5-7 days Long-term BP goal normotensive  Hyperlipidemia Home meds:  none,  LDL 30, goal < 70  Diabetes type II (no diagnosis) Home meds:  none HgbA1c 5.8, goal < 7.0 CBGs Recent Labs    12/18/20 2336 12/19/20 0343 12/19/20 0740  GLUCAP 129* 101* 114*    Other Stroke Risk Factors Advanced Age >/= 80  Cigarette smoker advised to stop smoking ETOH use, alcohol level <10, advised to drink no more than 2 drink(s) a day Substance abuse - UDS:  reported THC use Obesity, Body mass index is 34.38 kg/m., BMI >/= 30 associated with increased stroke risk, recommend weight loss, diet and exercise as appropriate    Other Active Problems AKI:  BUN: 31 creatinine: 2.38 on CRRT  Hypocalcemia: 8.0   Hospital  day # [place], NP  Stroke Service Nurse Practitioner Patient seen and discussed with attending physician Dr. Erlinda Hong   ATTENDING NOTE: I reviewed above note and agree with the assessment and plan. Pt was seen and examined.   RN and Dr. Lynetta Mare are at bedside.  Patient still intubated, on fentanyl, and propofol.  Off Levophed this morning.  Overnight had anemia hemoglobin 6.1, received PRBC transfusion.  No overt bleeding source at this time.   On exam, patient intubated on sedation, eyes closed but briefly [place] open on voice, not following commands. With forced eye opening, left eye in need position, right eye mildly over the vision, not blinking to visual threat, doll's eyes absent, not tracking, bilateral pupil 2 mm sluggish to light. Corneal reflex present, stronger on the left, gag and cough present. Breathing over the vent.  Facial symmetry not able to test due to ET tube.  Tongue protrusion not cooperative. On pain stimulation, no movement in the lower extremities. DTR diminished and no babinski. Sensation, coordination and gait not tested.  Yesterday TCD bubble study negative for PFO.  Repeat CT showed left thalamic subacute infarct as well as bilateral MCA/ACA watershed infarcts.  Given the distribution, concerning for perioperative hypotension in the setting of intracranial stenosis, however cardioembolic source cannot be completely excluded.  Currently, TCD still pending, may consider CTA head neck once renal function improves or MRA head and neck once able.  Continue home Keppra and Lamictal.  Continue B12 supplement.    Discussed with Dr. Lynetta Mare and Dr. Marcello Moores.  We will follow  For detailed assessment and plan, please refer to above as I have made changes wherever appropriate.   This patient is critically ill due to respiratory failure needing ventilation, AKI on CRRT, severe anemia needing blood transfusion, DVT on heparin IV, stroke and at significant risk of neurological  worsening, death form heart failure, renal failure, respiratory failure, seizure, PE, sepsis, stroke recurrence, hemorrhagic conversion, severe anemia with shock. This patient's care requires constant monitoring of vital signs, hemodynamics, respiratory and cardiac monitoring, review of multiple databases, neurological assessment, discussion with family, other specialists and medical decision making of high complexity. I spent 40 minutes of neurocritical care time in the care of this patient.  I have discussed with Dr. Lynetta Mare and Dr. Marcello Moores.  Rosalin Hawking, MD PhD Stroke Neurology 12/19/2020 2:25 PM   To contact Stroke Continuity provider, please refer to [email or web address] After hours, contact General Neurology

## 2020-12-19 NOTE — Plan of Care (Signed)
  Problem: Clinical Measurements: Goal: Ability to maintain clinical measurements within normal limits will improve Outcome: Progressing   

## 2020-12-19 NOTE — Progress Notes (Signed)
[facility] Progress Note Patient Name: Keith Dixon DOB: [birth year] MRN: [id number]   Date of Service  12/19/2020  HPI/Events of Note  Anemia - Hgb = 6.1.  eICU Interventions  Transfuse 1 unit PRBC now.     Intervention Category Major Interventions: Other:  Lysle Dingwall 12/19/2020, 5:47 AM

## 2020-12-19 NOTE — Progress Notes (Signed)
[facility] for heparin Indication: DVT  No Known Allergies  Patient Measurements: Height: 6' (182.9 cm) Weight: 115 kg (253 lb 8.5 oz) IBW/kg (Calculated) : 77.6  Vital Signs: Temp: 98 F (36.7 C) (08/10 1600) Temp Source: Axillary (08/10 1600) BP: 144/67 (08/10 1800) Pulse Rate: 91 (08/10 1800)  Labs: Recent Labs    12/18/20 1600 12/18/20 1744 12/18/20 2033 12/19/20 0327 12/19/20 0518 12/19/20 1529 12/19/20 1731  HGB  --  7.3*  --  6.1* 7.2*  --  9.3*  HCT  --  22.0*  --  18.0* 21.7*  --  27.2*  PLT  --  128*  --   --  132*  --  170  APTT  --   --  38*  --  43*  --  41*  CREATININE 3.13*  --   --   --  2.83* 2.65*  --      Estimated Creatinine Clearance: 39.8 mL/min (A) (by C-G formula based on SCr of 2.65 mg/dL (H)).   Medical History: Past Medical History:  Diagnosis Date   Anxiety    Arthritis    Bipolar 1 disorder, depressed ([place])    Bipolar disorder ([place])    Depression    History of posttraumatic stress disorder (PTSD)    Hypertension    PTSD (post-traumatic stress disorder)    Seizures ([place])    Sleep apnea    Smoker    Tobacco use disorder     Medications:  Medications Prior to Admission  Medication Sig Dispense Refill Last Dose   acetaminophen (TYLENOL) 500 MG tablet Take 2 tablets (1,000 mg total) by mouth every 6 (six) hours as needed for mild pain. 30 tablet 0 12/12/2020 at 0500   amLODipine (NORVASC) 10 MG tablet Take 10 mg by mouth in the morning.   12/12/2020 at 0500   baclofen (LIORESAL) 10 MG tablet Take 10 mg by mouth 2 (two) times daily.   12/12/2020 at 0500   Cholecalciferol (VITAMIN D3) 50 MCG (2000 UT) TABS Take 2,000 Units by mouth in the morning and at bedtime.   Past Week   gabapentin (NEURONTIN) 300 MG capsule Take 900 mg by mouth 2 (two) times daily.   12/12/2020 at 0500   hydrOXYzine (VISTARIL) 50 MG capsule Take 50 mg by mouth in the morning and at bedtime.   12/12/2020 at 0500   lamoTRIgine  (LAMICTAL) 100 MG tablet Take 1 tablet (100 mg total) by mouth 2 (two) times daily. 60 tablet 2 12/12/2020 at 0500   levETIRAcetam (KEPPRA) 750 MG tablet Take 1,500 mg by mouth 2 (two) times daily.   12/12/2020 at 0500   lisinopril-hydrochlorothiazide (ZESTORETIC) 20-12.5 MG tablet Take 1 tablet by mouth in the morning.   12/11/2020   meloxicam (MOBIC) 15 MG tablet Take 15 mg by mouth daily after breakfast.   12/11/2020   QUEtiapine (SEROQUEL) 200 MG tablet Take 200 mg by mouth at bedtime.   12/11/2020   sertraline (ZOLOFT) 100 MG tablet Take 100 mg by mouth daily after breakfast.   12/12/2020 at 0500    Assessment: 42 YOM s/p interbody fusion surgery developed a RLE DVT. Anticoagulation was initially held due to bleeding risk and an IVC filter was placed. Pharmacy now consulted to start IV heparin.   Of note, patient is on IV heparin at 750 units/hr via the CRRT circuit. Repeat aPTT is 41 which is subtherapeutic. Discussed with neurosurgery who is okay with continuing heparin. No overt s/s  of bleeding anywhere per RN   Goal of Therapy:  aPTT 50-70 seconds. On 8/13, liberalize to 56-85 Monitor platelets by anticoagulation protocol: Yes   Plan:  -Increase heparin to 1350 units/h -Recheck aPTT in 8h and monitor for s/s of bleeding   Alanda Slim, PharmD, [facility] Clinical Pharmacist Please see AMION for all Pharmacists' Contact Phone Numbers 12/19/2020, 6:43 PM

## 2020-12-19 NOTE — Progress Notes (Signed)
Subjective: Remains on CRRT, off pressors. Receiving 1 unit PRBC for anemia currently, received 1 unit yesterday  Objective: Vital signs in last 24 hours: Temp:  [97.9 F (36.6 C)-98.7 F (37.1 C)] 98.2 F (36.8 C) (08/10 0915) Pulse Rate:  [80-107] 96 (08/10 0915) Resp:  [0-28] 28 (08/10 0915) BP: (89-177)/(46-70) 122/60 (08/10 0900) SpO2:  [91 %-100 %] 96 % (08/10 0915) Arterial Line BP: (101-181)/(35-61) 181/59 (08/10 0915) FiO2 (%):  [40 %-60 %] 40 % (08/10 0800) Weight:  VD:[id number] kg] 115 kg (08/10 0348)  Intake/Output from previous day: 08/09 0701 - 08/10 0700 In: 3988.6 [I.V.:1448.6; Blood:381.5; NG/GT:2075; IV Piggyback:83.5] Out: 4812 [Urine:95] Intake/Output this shift: Total I/O In: 860.7 [I.V.:85.7; Blood:345; NG/GT:430] Out: 567 [Other:567]  On Fentanyl, propofol, heparin gtts Eyes partially open to light stim, minimal mvt in Ues or Les to stim Per nursing, back wound c/d/I Flank wound c/d/i  Lab Results: Recent Labs    12/18/20 1744 12/19/20 0327 12/19/20 0518  WBC 7.5  --  7.1  HGB 7.3* 6.1* 7.2*  HCT 22.0* 18.0* 21.7*  PLT 128*  --  132*   BMET Recent Labs    12/18/20 1600 12/19/20 0327 12/19/20 0518  NA 133* 133* 135  K 4.4 4.8 4.6  CL 102  --  102  CO2 24  --  26  GLUCOSE 127*  --  121*  BUN 32*  --  31*  CREATININE 3.13*  --  2.83*  CALCIUM 7.8*  --  8.0*    Studies/Results: CT HEAD WO CONTRAST (5MM)  Result Date: 12/18/2020 CLINICAL DATA:  Stroke.  Follow-up. EXAM: CT HEAD WITHOUT CONTRAST TECHNIQUE: Contiguous axial images were obtained from the base of the skull through the vertex without intravenous contrast. COMPARISON:  Head CT 2 days ago. FINDINGS: Brain: Subacute infarction in the left thalamus appears similar, without swelling or hemorrhage. No definite cerebellar infarction is confirmed. Small focal low densities in the bilateral parietal and occipital subcortical white matter and cortical brain are more noticeable. No large  confluent infarction is identified. MRI would be useful to more completely understand. No evidence of hemorrhage or shift. No hydrocephalus or extra-axial collection. Vascular: There is atherosclerotic calcification of the major vessels at the base of the brain. Skull: Negative Sinuses/Orbits: Clear/normal Other: Right scalp sebaceous cyst as seen previously. 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 Redemonstration of small cortical and subcortical infarctions in the parietal and occipital regions without evidence of hemorrhage or mass effect. Electronically Signed   By: Nelson Chimes M.D.   On: 12/18/2020 07:31   DG CHEST PORT 1 VIEW  Result Date: 12/19/2020 CLINICAL DATA:  Respiratory failure EXAM: PORTABLE CHEST 1 VIEW COMPARISON:  Radiograph 12/16/2020 FINDINGS: Endotracheal tube tip terminates 5 cm from the carina. Transesophageal tube tip terminates in the left upper quadrant with side port just distal to the GE junction. Right IJ approach central venous catheter tip terminates in the lower SVC near the superior cavoatrial junction. Multiple skin staples project over midline with evidence of lumbar fusion hardware, incompletely assessed on this exam. Telemetry leads and external support devices overlie the chest. Lung volumes remain diminished with increasing perihilar opacities specially confluent in the right perihilar and retrocardiac space. No pneumothorax or visible layering effusion. Stable cardiomediastinal contours accounting for differences in technique. No other acute osseous or soft tissue abnormality of the chest wall. IMPRESSION: 1. Diminishing lung volumes with increasing opacities in the perihilar and retrocardiac space. Could reflect  worsening atelectasis, edema or developing airspace disease. 2. Support devices in stable, satisfactory positioning as above. Electronically Signed   By: Lovena Le M.D.   On: 12/19/2020 03:39    VAS [place] TRANSCRANIAL DOPPLER W BUBBLES  Result Date: 12/18/2020  Transcranial Doppler with Bubble Patient Name:  Keith Dixon  Date of Exam:   12/18/2020 Medical Rec #: [id number]         Accession #:    [id number] Date of Birth: Feb 17, 1961          Patient Gender: M Patient Age:   89 years Exam Location:  [facility] Procedure:      VAS [place] TRANSCRANIAL [place] Referring Phys: Cornelius Moras XU --------------------------------------------------------------------------------  Indications: Stroke. Comparison Study: No prior studies. Performing Technologist: Darlin Coco RDMS,RVT  Examination Guidelines: A complete evaluation includes B-mode imaging, spectral Doppler, color Doppler, and power Doppler as needed of all accessible portions of each vessel. Bilateral testing is considered an integral part of a complete examination. Limited examinations for reoccurring indications may be performed as noted.  Summary: No HITS at rest or during Valsalva. Negative transcranial Doppler Bubble study with no evidence of right to left intracardiac communication.  A vascular evaluation was performed. The left middle cerebral artery was studied. An IV was inserted into the patient's left forearm. Verbal informed consent was obtained.  *See table(s) above for TCD measurements and observations.    Preliminary    VAS US CAROTID  Result Date: 12/18/2020 Carotid Arterial Duplex Study Patient Name:  Keith Dixon  Date of Exam:   12/18/2020 Medical Rec #: [id number]         Accession #:    [id number] Date of Birth: [birth year]          Patient Gender: M Patient Age:   74 years Exam Location:  [facility] Procedure:      VAS US CAROTID Referring Phys: Alferd Patee [facility] --------------------------------------------------------------------------------  Indications:       CVA. Risk Factors:      Hypertension, current smoker. Limitations        Today's exam was limited due to central venous catheter right                     jugular. Comparison Study:  No prior studies. Performing Technologist: Darlin Coco RDMS,RVT  Examination Guidelines: A complete evaluation includes B-mode imaging, spectral Doppler, color Doppler, and power Doppler as needed of all accessible portions of each vessel. Bilateral testing is considered an integral part of a complete examination. Limited examinations for reoccurring indications may be performed as noted.  Left Carotid Findings: +----------+--------+--------+--------+------------------+---------------------+           PSV cm/sEDV cm/sStenosisPlaque DescriptionComments              +----------+--------+--------+--------+------------------+---------------------+ CCA Prox  226     33                                Intimal thickening,                                                       Elevated velocities  without evidence of                                                       focal stenosis        +----------+--------+--------+--------+------------------+---------------------+ CCA Distal184     22                                                      +----------+--------+--------+--------+------------------+---------------------+ ICA Prox  93      38      1-39%   heterogenous                            +----------+--------+--------+--------+------------------+---------------------+ ICA Distal145     48                                tortuous              +----------+--------+--------+--------+------------------+---------------------+ ECA       113     18                                                      +----------+--------+--------+--------+------------------+---------------------+ +----------+--------+--------+-----------------------------+-------------------+           PSV cm/sEDV cm/sDescribe                     Arm Pressure (mmHG)  +----------+--------+--------+-----------------------------+-------------------+ [id number]             Elevated velocities without                                                evidence of focal stenosis                       +----------+--------+--------+-----------------------------+-------------------+ +---------+--------+--+--------+--+---------+ VertebralPSV cm/s87EDV cm/s25Antegrade +---------+--------+--+--------+--+---------+   Summary:  Left Carotid: Velocities in the left ICA are consistent with a 1-39% stenosis. Vertebrals:  Left vertebral artery demonstrates antegrade flow. Subclavians: Normal flow hemodynamics were seen in the left subclavian artery. *See table(s) above for measurements and observations.     Preliminary    VAS [place] TRANSCRANIAL DOPPLER  Result Date: 12/18/2020  Transcranial Doppler Patient Name:  Keith Dixon  Date of Exam:   12/18/2020 Medical Rec #: [id number]         Accession #:    [id number] Date of Birth: January 15, 1961          Patient Gender: M Patient Age:   3 years Exam Location:  [facility] Procedure:      VAS [place] TRANSCRANIAL DOPPLER Referring Phys: Cornelius Moras XU --------------------------------------------------------------------------------  Indications: Stroke. Limitations: Central venous catheter RT jugular and collar. Comparison Study: No prior studies. Performing Technologist: Darlin Coco RDMS,RVT  Examination Guidelines: A complete evaluation includes B-mode imaging, spectral Doppler, color Doppler, and  power Doppler as needed of all accessible portions of each vessel. Bilateral testing is considered an integral part of a complete examination. Limited examinations for reoccurring indications may be performed as noted.  +----------+-------------+---------+-----------+-------------------------------+ RIGHT TCD Right VM (cm)  Depth  Pulsatility            Comment                                      (cm)                                               +----------+-------------+---------+-----------+-------------------------------+ MCA           74.00                1.21                                    +----------+-------------+---------+-----------+-------------------------------+ ACA          -26.00                0.88                                    +----------+-------------+---------+-----------+-------------------------------+ Term ICA      45.00                0.97                                    +----------+-------------+---------+-----------+-------------------------------+ PCA           43.00                0.94                                    +----------+-------------+---------+-----------+-------------------------------+ Opthalmic     20.00                1.83                                    +----------+-------------+---------+-----------+-------------------------------+ ICA siphon    88.00                1.17                                    +----------+-------------+---------+-----------+-------------------------------+ Vertebral                                     Unable to insonate due to  collar              +----------+-------------+---------+-----------+-------------------------------+ Distal ICA                                  Unable to insonate due to CVC  +----------+-------------+---------+-----------+-------------------------------+  +----------+------------+----------+-----------+------------------------------+ LEFT TCD  Left VM (cm)Depth (cm)Pulsatility           Comment             +----------+------------+----------+-----------+------------------------------+ MCA          105.00                1.25                                   +----------+------------+----------+-----------+------------------------------+ ACA          -47.00                0.79                                    +----------+------------+----------+-----------+------------------------------+ Term ICA     49.00                 1.18                                   +----------+------------+----------+-----------+------------------------------+ PCA          45.00                 1.21                                   +----------+------------+----------+-----------+------------------------------+ Opthalmic    25.00                 1.90                                   +----------+------------+----------+-----------+------------------------------+ ICA siphon   48.00                 1.19                                   +----------+------------+----------+-----------+------------------------------+ Vertebral                                    Unable to insonate due to                                                           collar             +----------+------------+----------+-----------+------------------------------+ Distal ICA   47.00                 1.14                                   +----------+------------+----------+-----------+------------------------------+  +------------+-----+--------------------------------+  VM cm            Comment              +------------+-----+--------------------------------+ Prox Basilar     Unable to insonate due to collar +------------+-----+--------------------------------+ Dist Basilar     Unable to insonate due to collar +------------+-----+--------------------------------+ +----------------------+----------------+ Right Lindegaard RatioUnable to obtain +----------------------+----------------+ +---------------------+----+ Left Lindegaard Ratio2.23 +---------------------+----+    Preliminary     Assessment/Plan: 60 yo M s/p T10-iliac fusion with multiple organ system failure, remains critically ill.  Off pressors, but remains severely oliguric - hep gtt goal 50-70 - continue to monitor Hgb, plt  level - plan is for decreased sedation tomorrow to allow for more detailed CNS and lower extremity neuro exams   Vallarie Mare 12/19/2020, 10:01 AM

## 2020-12-20 ENCOUNTER — Inpatient Hospital Stay (HOSPITAL_COMMUNITY): Payer: No Typology Code available for payment source

## 2020-12-20 ENCOUNTER — Encounter (HOSPITAL_COMMUNITY): Payer: Self-pay | Admitting: Neurosurgery

## 2020-12-20 DIAGNOSIS — J95859 Other complication of respirator [ventilator]: Secondary | ICD-10-CM | POA: Diagnosis not present

## 2020-12-20 LAB — RENAL FUNCTION PANEL
Albumin: 1.6 g/dL — ABNORMAL LOW (ref 3.5–5.0)
Albumin: 1.7 g/dL — ABNORMAL LOW (ref 3.5–5.0)
Anion gap: 8 (ref 5–15)
Anion gap: 7 (ref 5–15)
BUN: 33 mg/dL — ABNORMAL HIGH (ref 6–20)
BUN: 36 mg/dL — ABNORMAL HIGH (ref 6–20)
CO2: 26 mmol/L (ref 22–32)
CO2: 26 mmol/L (ref 22–32)
Calcium: 8.3 mg/dL — ABNORMAL LOW (ref 8.9–10.3)
Calcium: 8.5 mg/dL — ABNORMAL LOW (ref 8.9–10.3)
Chloride: 103 mmol/L (ref 98–111)
Chloride: 103 mmol/L (ref 98–111)
Creatinine, Ser: 2.67 mg/dL — ABNORMAL HIGH (ref 0.61–1.24)
Creatinine, Ser: 2.47 mg/dL — ABNORMAL HIGH (ref 0.61–1.24)
GFR, Estimated: 27 mL/min — ABNORMAL LOW (ref >60)
GFR, Estimated: 29 mL/min — ABNORMAL LOW (ref >60)
Glucose, Bld: 145 mg/dL — ABNORMAL HIGH (ref 70–99)
Glucose, Bld: 131 mg/dL — ABNORMAL HIGH (ref 70–99)
Phosphorus: 2.2 mg/dL — ABNORMAL LOW (ref 2.5–4.6)
Phosphorus: 2.6 mg/dL (ref 2.5–4.6)
Potassium: 4.4 mmol/L (ref 3.5–5.1)
Potassium: 4.9 mmol/L (ref 3.5–5.1)
Sodium: 137 mmol/L (ref 135–145)
Sodium: 136 mmol/L (ref 135–145)

## 2020-12-20 LAB — BPAM RBC
Blood Product Expiration Date: 202208162359
Blood Product Expiration Date: 202209062359
Blood Product Expiration Date: 202209062359
ISSUE DATE / TIME: 202208091231
ISSUE DATE / TIME: 202208100614
ISSUE DATE / TIME: 202208101044
Unit Type and Rh: 6200
Unit Type and Rh: 6200
Unit Type and Rh: 6200

## 2020-12-20 LAB — GLUCOSE, CAPILLARY
Glucose-Capillary: 125 mg/dL — ABNORMAL HIGH (ref 70–99)
Glucose-Capillary: 130 mg/dL — ABNORMAL HIGH (ref 70–99)
Glucose-Capillary: 134 mg/dL — ABNORMAL HIGH (ref 70–99)
Glucose-Capillary: 134 mg/dL — ABNORMAL HIGH (ref 70–99)
Glucose-Capillary: 136 mg/dL — ABNORMAL HIGH (ref 70–99)
Glucose-Capillary: 168 mg/dL — ABNORMAL HIGH (ref 70–99)

## 2020-12-20 LAB — CBC
HCT: 26.6 % — ABNORMAL LOW (ref 39.0–52.0)
Hemoglobin: 9.1 g/dL — ABNORMAL LOW (ref 13.0–17.0)
MCH: 28.7 pg (ref 26.0–34.0)
MCHC: 34.2 g/dL (ref 30.0–36.0)
MCV: 83.9 fL (ref 80.0–100.0)
Platelets: 173 10*3/uL (ref 150–400)
RBC: 3.17 MIL/uL — ABNORMAL LOW (ref 4.22–5.81)
RDW: 16.5 % — ABNORMAL HIGH (ref 11.5–15.5)
WBC: 9.5 10*3/uL (ref 4.0–10.5)
nRBC: 0 % (ref 0.0–0.2)

## 2020-12-20 LAB — TYPE AND SCREEN
ABO/RH(D): A POS
Antibody Screen: NEGATIVE
Unit division: 0
Unit division: 0
Unit division: 0

## 2020-12-20 LAB — TRIGLYCERIDES: Triglycerides: 213 mg/dL — ABNORMAL HIGH (ref <150)

## 2020-12-20 LAB — CK: Total CK: 1736 U/L — ABNORMAL HIGH (ref 49–397)

## 2020-12-20 LAB — CULTURE, RESPIRATORY W GRAM STAIN: Culture: NORMAL

## 2020-12-20 LAB — APTT
aPTT: 44 s — ABNORMAL HIGH (ref 24–36)
aPTT: 44 seconds — ABNORMAL HIGH (ref 24–36)
aPTT: 47 seconds — ABNORMAL HIGH (ref 24–36)

## 2020-12-20 LAB — CALCIUM, IONIZED: Calcium, Ionized, Serum: 3.7 mg/dL — ABNORMAL LOW (ref 4.5–5.6)

## 2020-12-20 LAB — MAGNESIUM: Magnesium: 2.6 mg/dL — ABNORMAL HIGH (ref 1.7–2.4)

## 2020-12-20 MED ORDER — ZINC SULFATE 220 (50 ZN) MG PO CAPS
220.0000 mg | ORAL_CAPSULE | Freq: Every day | ORAL | Status: DC
  Administered 2020-12-20 – 2021-01-08 (×19): 220 mg
  Filled 2020-12-20 (×20): qty 1

## 2020-12-20 MED ORDER — AMLODIPINE BESYLATE 10 MG PO TABS: 10.0000 mg | ORAL_TABLET | Freq: Every morning | ORAL | Status: DC

## 2020-12-20 MED ORDER — AMLODIPINE BESYLATE 10 MG PO TABS
10.0000 mg | ORAL_TABLET | Freq: Every morning | ORAL | Status: DC
  Administered 2020-12-20 – 2020-12-24 (×4): 10 mg
  Filled 2020-12-20 (×6): qty 1

## 2020-12-20 MED ORDER — DEXMEDETOMIDINE HCL IN NACL 400 MCG/100ML IV SOLN
0.0000 ug/kg/h | INTRAVENOUS | Status: DC
  Administered 2020-12-20: 0.4 ug/kg/h via INTRAVENOUS
  Filled 2020-12-20: qty 100

## 2020-12-20 MED ORDER — OXYCODONE HCL 5 MG PO TABS
10.0000 mg | ORAL_TABLET | ORAL | Status: DC | PRN
  Administered 2020-12-20 – 2021-01-08 (×28): 10 mg
  Filled 2020-12-20 (×29): qty 2

## 2020-12-20 MED ORDER — VITAMIN D 25 MCG (1000 UNIT) PO TABS
2000.0000 [IU] | ORAL_TABLET | Freq: Two times a day (BID) | ORAL | Status: DC
  Administered 2020-12-20 – 2021-01-08 (×38): 2000 [IU]
  Filled 2020-12-20 (×38): qty 2

## 2020-12-20 MED ORDER — FENTANYL CITRATE (PF) 100 MCG/2ML IJ SOLN: 25.0000 ug | INTRAMUSCULAR | Status: DC | PRN

## 2020-12-20 MED ORDER — SODIUM PHOSPHATES 45 MMOLE/15ML IV SOLN
15.0000 mmol | Freq: Once | INTRAVENOUS | Status: AC
  Administered 2020-12-20: 15 mmol via INTRAVENOUS
  Filled 2020-12-20: qty 5

## 2020-12-20 MED ORDER — ASCORBIC ACID 500 MG PO TABS: 500.0000 mg | ORAL_TABLET | Freq: Two times a day (BID) | ORAL | Status: DC

## 2020-12-20 MED ORDER — BACITRACIN ZINC 500 UNIT/GM EX OINT
TOPICAL_OINTMENT | Freq: Every day | CUTANEOUS | Status: DC
  Administered 2020-12-22 – 2020-12-26 (×4): 1 via TOPICAL
  Filled 2020-12-20 (×6): qty 0.9
  Filled 2020-12-20: qty 28.4

## 2020-12-20 MED ORDER — VITAMIN D 25 MCG (1000 UNIT) PO TABS: 2000.0000 [IU] | ORAL_TABLET | Freq: Two times a day (BID) | ORAL | Status: DC

## 2020-12-20 MED ORDER — FENTANYL CITRATE (PF) 100 MCG/2ML IJ SOLN
50.0000 ug | INTRAMUSCULAR | Status: DC | PRN
  Administered 2020-12-24: 50 ug via INTRAVENOUS
  Administered 2020-12-25: 100 ug via INTRAVENOUS
  Filled 2020-12-20 (×2): qty 2

## 2020-12-20 MED ORDER — ASCORBIC ACID 500 MG PO TABS
250.0000 mg | ORAL_TABLET | Freq: Every day | ORAL | Status: AC
  Administered 2020-12-20 – 2020-12-24 (×5): 250 mg
  Filled 2020-12-20 (×5): qty 1

## 2020-12-20 MED ORDER — CEFAZOLIN SODIUM-DEXTROSE 2-4 GM/100ML-% IV SOLN
2.0000 g | INTRAVENOUS | Status: AC
  Administered 2020-12-21: 2 g via INTRAVENOUS
  Filled 2020-12-20: qty 100

## 2020-12-20 MED ORDER — ALTEPLASE 2 MG IJ SOLR: 2.0000 mg | Freq: Once | INTRAMUSCULAR | Status: DC

## 2020-12-20 NOTE — Progress Notes (Signed)
Patient ID: Keith Dixon, male   DOB: [birth year], 60 y.o.   MRN: [id number] [name] Progress Note   Assessment/ Plan:   1.  Acute kidney injury: Appears to be from a combination of ATN and rhabdomyolysis/pigment nephropathy.  He has dense renal injury.  He was started on CRRT on 8/7 for correction of multiple metabolic abnormalities and volume unloading.  Note that he also is charted as getting IV contrast on 8/7 - Continue CRRT continue UF goal of net negative 100 ml/hr as tolerated  - Do not restart CRRT if his circuit clots or cartridge expires - we will transition to intermittent HD after that point - consult IR for tunneled catheter - currently using nontunneled femoral line - NPO after midnight tonight for tunneled catheter placement with IR - heparin in filter  - note his team added systemic heparin   2.  Hyponatremia: Secondary to free water handling defect in the setting of acute kidney injury; Improved with CRRT  3.  Acute postoperative hypoxic/hypercapnic respiratory failure: Intubated and on ventilator support per CCM.  4.  Lumbar spine stenosis/scoliosis status post decompression and fusion surgery: Ongoing management/surveillance by neurosurgery.  5.  Shock: Secondary to distributive/hypovolemic mechanism.  Status post intravenous fluids and pressors per primary.  Now off of pressors.  6.  Acute blood loss anemia: Secondary to surgical associated losses, ongoing PRBC transfusion for continued downtrend of hemoglobin and hematocrit.  No ESA for now with acute CVA.  PRBC's on 8/9 and again on 8/10.  Gave additional unit on 8/10. Improved.  7.  Acute multifocal bilateral CVA: Suggestive of embolic disease, supportive management per primary team   Disposition - continue inpatient monitoring  Subjective:   He was anuric over 8/10.  He has been on CRRT and had 6.6 liters UF over 8/10 with CRRT.  I spoke with [place] at bedside, his significant other and  updated her.  We discussed risks/benefits/indications for tunneled catheter placement - she consents to placement.  Review of systems:   Unable to obtain 2/2 mechanical ventilation    Objective:   BP (!) 184/80 Comment: PRN given  Pulse 88   Temp 98 F (36.7 C) (Axillary)   Resp (!) 22   Ht 6' (1.829 m)   Wt 115 kg   SpO2 97%   BMI 34.38 kg/m   Intake/Output Summary (Last 24 hours) at 12/20/2020 2694 Last data filed at 12/20/2020 0900 Gross per 24 hour  Intake 3325.67 ml  Output 6567 ml  Net -3241.33 ml   Weight change:   Physical Exam:  General adult male in bed intubated HEENT normocephalic atraumatic Neck trachea midline Lungs coarse mechanical breath sounds Heart S1S2 no rub Abdomen soft nontender with sedation nondistended Extremities 1+ edema lower extremities Neuro - continuous sedation running Access: left femoral nontunneled dialysis catheter   Imaging: DG CHEST PORT 1 VIEW  Result Date: 12/19/2020 CLINICAL DATA:  Respiratory failure EXAM: PORTABLE CHEST 1 VIEW COMPARISON:  Radiograph 12/16/2020 FINDINGS: Endotracheal tube tip terminates 5 cm from the carina. Transesophageal tube tip terminates in the left upper quadrant with side port just distal to the GE junction. Right IJ approach central venous catheter tip terminates in the lower SVC near the superior cavoatrial junction. Multiple skin staples project over midline with evidence of lumbar fusion hardware, incompletely assessed on this exam. Telemetry leads and external support devices overlie the chest. Lung volumes remain diminished with increasing perihilar opacities specially confluent in the right perihilar and  retrocardiac space. No pneumothorax or visible layering effusion. Stable cardiomediastinal contours accounting for differences in technique. No other acute osseous or soft tissue abnormality of the chest wall. IMPRESSION: 1. Diminishing lung volumes with increasing opacities in the perihilar and  retrocardiac space. Could reflect worsening atelectasis, edema or developing airspace disease. 2. Support devices in stable, satisfactory positioning as above. Electronically Signed   By: Lovena Le M.D.   On: 12/19/2020 03:39   VAS [place] TRANSCRANIAL DOPPLER W BUBBLES  Result Date: 12/18/2020  Transcranial Doppler with Bubble Patient Name:  Keith Dixon  Date of Exam:   12/18/2020 Medical Rec #: [id number]         Accession #:    [id number] Date of Birth: 03/14/1961          Patient Gender: M Patient Age:   66 years Exam Location:  [facility] Procedure:      VAS [place] TRANSCRANIAL [place] Referring Phys: Cornelius Moras XU --------------------------------------------------------------------------------  Indications: Stroke. Comparison Study: No prior studies. Performing Technologist: Darlin Coco RDMS,RVT  Examination Guidelines: A complete evaluation includes B-mode imaging, spectral Doppler, color Doppler, and power Doppler as needed of all accessible portions of each vessel. Bilateral testing is considered an integral part of a complete examination. Limited examinations for reoccurring indications may be performed as noted.  Summary: No HITS at rest or during Valsalva. Negative transcranial Doppler Bubble study with no evidence of right to left intracardiac communication.  A vascular evaluation was performed. The left middle cerebral artery was studied. An IV was inserted into the patient's left forearm. Verbal informed consent was obtained.  *See table(s) above for TCD measurements and observations.    Preliminary    VAS US CAROTID  Result Date: 12/18/2020 Carotid Arterial Duplex Study Patient Name:  Keith Dixon  Date of Exam:   12/18/2020 Medical Rec #: [id number]         Accession #:    [id number] Date of Birth: 1961/04/27          Patient Gender: M Patient Age:   60 years Exam Location:  [facility] Procedure:      VAS US CAROTID Referring Phys: Alferd [facility]  --------------------------------------------------------------------------------  Indications:       CVA. Risk Factors:      Hypertension, current smoker. Limitations        Today's exam was limited due to central venous catheter right                    jugular. Comparison Study:  No prior studies. Performing Technologist: Darlin Coco RDMS,RVT  Examination Guidelines: A complete evaluation includes B-mode imaging, spectral Doppler, color Doppler, and power Doppler as needed of all accessible portions of each vessel. Bilateral testing is considered an integral part of a complete examination. Limited examinations for reoccurring indications may be performed as noted.  Left Carotid Findings: +----------+--------+--------+--------+------------------+---------------------+           PSV cm/sEDV cm/sStenosisPlaque DescriptionComments              +----------+--------+--------+--------+------------------+---------------------+ CCA Prox  226     33                                Intimal thickening,  Elevated velocities                                                       without evidence of                                                       focal stenosis        +----------+--------+--------+--------+------------------+---------------------+ CCA Distal184     22                                                      +----------+--------+--------+--------+------------------+---------------------+ ICA Prox  93      38      1-39%   heterogenous                            +----------+--------+--------+--------+------------------+---------------------+ ICA Distal145     48                                tortuous              +----------+--------+--------+--------+------------------+---------------------+ ECA       113     18                                                       +----------+--------+--------+--------+------------------+---------------------+ +----------+--------+--------+-----------------------------+-------------------+           PSV cm/sEDV cm/sDescribe                     Arm Pressure (mmHG) +----------+--------+--------+-----------------------------+-------------------+ [id number]             Elevated velocities without                                                evidence of focal stenosis                       +----------+--------+--------+-----------------------------+-------------------+ +---------+--------+--+--------+--+---------+ VertebralPSV cm/s87EDV cm/s25Antegrade +---------+--------+--+--------+--+---------+   Summary:  Left Carotid: Velocities in the left ICA are consistent with a 1-39% stenosis. Vertebrals:  Left vertebral artery demonstrates antegrade flow. Subclavians: Normal flow hemodynamics were seen in the left subclavian artery. *See table(s) above for measurements and observations.     Preliminary    VAS [place] TRANSCRANIAL DOPPLER  Result Date: 12/18/2020  Transcranial Doppler Patient Name:  Keith Dixon  Date of Exam:   12/18/2020 Medical Rec #: [id number]         Accession #:    [id number] Date of Birth: 07/09/1960          Patient Gender: M Patient Age:   36 years  Exam Location:  [facility] Procedure:      VAS [place] TRANSCRANIAL DOPPLER Referring Phys: Cornelius Moras XU --------------------------------------------------------------------------------  Indications: Stroke. Limitations: Central venous catheter RT jugular and collar. Comparison Study: No prior studies. Performing Technologist: Darlin Coco RDMS,RVT  Examination Guidelines: A complete evaluation includes B-mode imaging, spectral Doppler, color Doppler, and power Doppler as needed of all accessible portions of each vessel. Bilateral testing is considered an integral part of a complete examination. Limited examinations for reoccurring indications may be  performed as noted.  +----------+-------------+---------+-----------+-------------------------------+ RIGHT TCD Right VM (cm)  Depth  Pulsatility            Comment                                      (cm)                                              +----------+-------------+---------+-----------+-------------------------------+ MCA           74.00                1.21                                    +----------+-------------+---------+-----------+-------------------------------+ ACA          -26.00                0.88                                    +----------+-------------+---------+-----------+-------------------------------+ Term ICA      45.00                0.97                                    +----------+-------------+---------+-----------+-------------------------------+ PCA           43.00                0.94                                    +----------+-------------+---------+-----------+-------------------------------+ Opthalmic     20.00                1.83                                    +----------+-------------+---------+-----------+-------------------------------+ ICA siphon    88.00                1.17                                    +----------+-------------+---------+-----------+-------------------------------+ Vertebral  Unable to insonate due to                                                           collar              +----------+-------------+---------+-----------+-------------------------------+ Distal ICA                                  Unable to insonate due to CVC  +----------+-------------+---------+-----------+-------------------------------+  +----------+------------+----------+-----------+------------------------------+ LEFT TCD  Left VM (cm)Depth (cm)Pulsatility           Comment              +----------+------------+----------+-----------+------------------------------+ MCA          105.00                1.25                                   +----------+------------+----------+-----------+------------------------------+ ACA          -47.00                0.79                                   +----------+------------+----------+-----------+------------------------------+ Term ICA     49.00                 1.18                                   +----------+------------+----------+-----------+------------------------------+ PCA          45.00                 1.21                                   +----------+------------+----------+-----------+------------------------------+ Opthalmic    25.00                 1.90                                   +----------+------------+----------+-----------+------------------------------+ ICA siphon   48.00                 1.19                                   +----------+------------+----------+-----------+------------------------------+ Vertebral                                    Unable to insonate due to  collar             +----------+------------+----------+-----------+------------------------------+ Distal ICA   47.00                 1.14                                   +----------+------------+----------+-----------+------------------------------+  +------------+-----+--------------------------------+             VM cm            Comment              +------------+-----+--------------------------------+ Prox Basilar     Unable to insonate due to collar +------------+-----+--------------------------------+ Dist Basilar     Unable to insonate due to collar +------------+-----+--------------------------------+ +----------------------+----------------+ Right Lindegaard RatioUnable to obtain +----------------------+----------------+  +---------------------+----+ Left Lindegaard Ratio2.23 +---------------------+----+    Preliminary     Labs: BMET Recent Labs  Lab 12/17/20 0500 12/17/20 0536 12/17/20 1554 12/18/20 0430 12/18/20 1600 12/19/20 0327 12/19/20 0518 12/19/20 1529 12/20/20 0339  NA 130*   < > 130* 131* 133* 133* 135 134* 137  K 5.2*   < > 4.7 4.3 4.4 4.8 4.6 4.7 4.4  CL 99  --  99 98 102  --  102 101 103  CO2 20*  --  _0 --  _1 GLUCOSE 130*  --  105* 145* 127*  --  121* 116* 145*  BUN 48*  --  40* 33* 32*  --  31* 32* 33*  CREATININE 4.91*  --  4.12* 3.37* 3.13*  --  2.83* 2.65* 2.67*  CALCIUM 7.6*  --  7.9* 7.8* 7.8*  --  8.0* 8.1* 8.3*  PHOS 5.7*  --  4.5 3.5 3.3  --  2.6 2.6 2.2*   < > = values in this interval not displayed.   CBC Recent Labs  Lab 12/18/20 1744 12/19/20 0327 12/19/20 0518 12/19/20 1731 12/20/20 0339  WBC 7.5  --  7.1 8.8 9.5  NEUTROABS  --   --  5.2  --   --   HGB 7.3* 6.1* 7.2* 9.3* 9.1*  HCT 22.0* 18.0* 21.7* 27.2* 26.6*  MCV 82.7  --  83.1 83.2 83.9  PLT 128*  --  132* 170 173   Medications:     albuterol  2.5 mg Nebulization Q6H   chlorhexidine gluconate (MEDLINE KIT)  15 mL Mouth Rinse BID   Chlorhexidine Gluconate Cloth  6 each Topical Daily   cholecalciferol  2,000 Units Per Tube BID   cyanocobalamin  1,000 mcg Intramuscular Daily   Followed by   Derrill Memo ON 12/23/2020] vitamin B-12  1,000 mcg Oral Daily   docusate  100 mg Per Tube BID   feeding supplement (PROSource TF)  90 mL Per Tube BID   folic acid  1 mg Intravenous Daily   gabapentin  300 mg Per Tube Q12H   guaiFENesin  10 mL Per Tube BID   lamoTRIgine  100 mg Per Tube BID   levETIRAcetam  1,000 mg Per Tube BID   mouth rinse  15 mL Mouth Rinse 10 times per day   pantoprazole sodium  40 mg Per Tube QHS   polyethylene glycol  17 g Per Tube Daily   QUEtiapine  200 mg Per Tube QHS   sertraline  100 mg Per Tube QPC breakfast   sodium chloride flush  3 mL Intravenous Q12H   [START  ON  12/25/2020] thiamine injection  100 mg Intravenous Daily   Claudia Desanctis, MD 12/20/2020,10:01 AM

## 2020-12-20 NOTE — Progress Notes (Signed)
ANTICOAGULATION CONSULT NOTE  Pharmacy Consult for heparin Indication: DVT  No Known Allergies  Patient Measurements: Height: 6' (182.9 cm) Weight: 115 kg (253 lb 8.5 oz) IBW/kg (Calculated) : 77.6  Vital Signs: Temp: 98.8 F (37.1 C) (08/11 2000) Temp Source: Oral (08/11 2000) BP: 126/63 (08/11 2200) Pulse Rate: 103 (08/11 2200)  Labs: Recent Labs    12/19/20 0518 12/19/20 1529 12/19/20 1731 12/20/20 0339 12/20/20 1313 12/20/20 1601 12/20/20 2119  HGB 7.2*  --  9.3* 9.1*  --   --   --   HCT 21.7*  --  27.2* 26.6*  --   --   --   PLT 132*  --  170 173  --   --   --   APTT 43*  --  41* 44* 44*  --  47*  CREATININE 2.83* 2.65*  --  2.67*  --  2.47*  --   CKTOTAL  --   --   --  1,736*  --   --   --      Estimated Creatinine Clearance: 42.7 mL/min (A) (by C-G formula based on SCr of 2.47 mg/dL (H)).  Assessment: 60 yo male with RLE DVT s/p recent lumbar fusion, for heparin surgery   Repeat aPTT remains subtherapeutic at 47 despite ongoing rate adjustments.  Goal of Therapy:  aPTT 50-70 seconds. On 8/13, liberalize to 56-85 Monitor platelets by anticoagulation protocol: Yes   Plan:  Increase heparin to 1800 units/h Repeat aPTT in 8h   Arrie Senate, PharmD, [place], [facility] Clinical Pharmacist [phone] Please check AMION for all [place] numbers 12/20/2020

## 2020-12-20 NOTE — Progress Notes (Signed)
STROKE TEAM PROGRESS NOTE   INTERVAL HISTORY CCM Dr. Lynetta Mare at the bedside.  Patient still intubated off propofol, continue on fentanyl with holding of fentanyl, patient was able to open eyes slowly on voice, seem to able to follow commands eye opening and close but not following other commands.  Will undergo HD catheter tomorrow for continued dialysis.  Hemoglobin stable, continue heparin IV.  Vitals:   12/20/20 1100 12/20/20 1135 12/20/20 1137 12/20/20 1200  BP: 115/65 (!) 154/64 (!) 154/64 (!) 163/68  Pulse: 79  85 92  Resp: (!) 26  (!) 32 (!) 31  Temp:    98.9 F (37.2 C)  TempSrc:    Axillary  SpO2: 94%  96% 95%  Weight:      Height:       CBC:  Recent Labs  Lab 12/19/20 0518 12/19/20 1731 12/20/20 0339  WBC 7.1 8.8 9.5  NEUTROABS 5.2  --   --   HGB 7.2* 9.3* 9.1*  HCT 21.7* 27.2* 26.6*  MCV 83.1 83.2 83.9  PLT 132* 170 [id number]   Basic Metabolic Panel:  Recent Labs  Lab 12/19/20 0518 12/19/20 1529 12/20/20 0339  NA 135 134* 137  K 4.6 4.7 4.4  CL 102 101 103  CO2 '26 26 26  '$ GLUCOSE 121* 116* 145*  BUN 31* 32* 33*  CREATININE 2.83* 2.65* 2.67*  CALCIUM 8.0* 8.1* 8.3*  MG 2.6*  --  2.6*  PHOS 2.6 2.6 2.2*   Lipid Panel:  Recent Labs  Lab 12/16/20 1623 12/17/20 0500 12/20/20 0339  CHOL 89  --   --   TRIG 235*   < > 213*  HDL 12*  --   --   CHOLHDL 7.4  --   --   VLDL 47*  --   --   LDLCALC 30  --   --    < > = values in this interval not displayed.   HgbA1c:  Recent Labs  Lab 12/17/20 0500  HGBA1C 5.8*    IMAGING past 24 hours No results found.   Physical Exam   Temp:  [98 F (36.7 C)-99.2 F (37.3 C)] 98.9 F (37.2 C) (08/11 1200) Pulse Rate:  [79-95] 92 (08/11 1200) Resp:  [15-32] 31 (08/11 1200) BP: (100-184)/(46-80) 163/68 (08/11 1200) SpO2:  [94 %-100 %] 95 % (08/11 1200) Arterial Line BP: (93-216)/(42-77) 122/49 (08/11 1100) FiO2 (%):  [40 %] 40 % (08/11 1241)  General - Well nourished, well developed, intubated on  sedation.  Ophthalmologic - fundi not visualized due to noncooperation.  Cardiovascular - Regular rate and rhythm.  Neuro - Intubated on sedation, eyes closed but slowly open on voice, not following commands except open and close eyes. With eye opening, eyes midline, not consistently blinking to visual threat, doll's eyes absent, not tracking, bilateral pupil 2 mm sluggish to light. Corneal reflex present, gag and cough present. Breathing over the vent.  Facial symmetry not able to test due to ET tube.  Tongue protrusion not cooperative. On pain stimulation, no movement in the lower extremities. DTR diminished and no babinski. Sensation, coordination and gait not tested.     Assessment and Plan  Keith Dixon is a 60 y.o. male with PMH significant for PTSD, OSA, HTN, tobacco use, ?seizures, bipolar, degenerativse scoliosis who underwent extensive T10-pelvis decompression and fusion over 2 days(8/4 and 8/5) and difficulty waking up after surgery. Also complicated by Aki, rhabdomyolysis, DVT in RLE and left IJ s/p IVC filter and poor mentation/encephalopathy. Workup  with CTH demonstrated embolic appearing strokes and neurology consulted.  #Stroke: bilateral cortical MCA/ACA watershed area and left thalamic infarcts, concerning for perioperative hypotension in the setting of intracranial stenosis. Can not rule out cardioembolic source CT head Small recent appearing bilateral cortical infarcts, left thalamic and probable bilateral cerebellar infarcts.  MRI  unable d/t hardware and CRRT with multiple lines of meds Carotid Doppler w/LICA [id number] % stenosis, RICA not able to sonicate due to central line CTA Head on 09/11/20 with no significant intracranial stenosis; are considering obtaining a CTA Head and Neck when Cre normalizes 2D Echo w/EF 65 to 70 %, LA normal in size  Bilateral Lower Extremity Ultrasound showed a right DVT- TCD with Bubble was negative for PFO  TCD preliminary read has been released,  waiting for final ready  LDL 30 HgbA1c 5.8 VTE prophylaxis - heparin IV No antithrombotic prior to admission, now on heparin IV given DVT Therapy recommendations:  pending  #Seizure History  LTM this admission did not show any seizures or epileptiform discharges.  LTM d/c 8/9 Continue with Lamictal 100 mg BID and Keppra 1000 mg BID.   DVT LE venous doppler showed right LE DVT Also left IJ thrombosis found during line placement S/p IVC filter Now on heparin IV  AKI with renal failure Cre 6.74->4.91->4.12->3.37->2.83->2.67 Was on CRRT Place HD catheter tomorrow to continue HD Nephrology on board  Severe anemia Hb 7.5->6.1-> PRBC->7.2->9.3->9.1 Continue CBC monitoring Continue heparin IV for now  Extensive back surgery Dr. Marcello Moores on board Status post surgery 8/4 and 8/5 Drainage removed 8/9  Respiratory failure Intubated on vent On sedation, tapering off CCM on board May need tracheostomy - continue heparin IV for now  #Hypertension Home meds:  norvasc Stable Long-term BP goal normotensive  Hyperlipidemia Home meds:  none  LDL 30, goal < 70 No statin needed at the time given LDL at goal  Tobacco abuse Current smoker Smoking cessation counseling will be provided  Other Stroke Risk Factors ETOH use, alcohol level <10, advised to drink no more than 2 drink(s) a day Substance abuse - UDS:  reported THC use Obesity, Body mass index is 34.38 kg/m., BMI >/= 30 associated with increased stroke risk, recommend weight loss, diet and exercise as appropriate   Other Active Problems B12 deficiency, supplement Hypocalcemia   Hospital day # 8  This patient is critically ill due to respiratory failure, AKI, severe anemia, DVT, stroke and at significant risk of neurological worsening, death form heart failure, renal failure, seizure, PE, sepsis, severe anemia. This patient's care requires constant monitoring of vital signs, hemodynamics, respiratory and cardiac  monitoring, review of multiple databases, neurological assessment, discussion with family, other specialists and medical decision making of high complexity. I spent 35 minutes of neurocritical care time in the care of this patient. I have discussed with Dr. Lynetta Mare.  Rosalin Hawking, MD PhD Stroke Neurology 12/20/2020 12:51 PM   To contact Stroke Continuity provider, please refer to [email or web address] After hours, contact General Neurology

## 2020-12-20 NOTE — Progress Notes (Signed)
[facility] for heparin Indication: DVT  No Known Allergies  Patient Measurements: Height: 6' (182.9 cm) Weight: 115 kg (253 lb 8.5 oz) IBW/kg (Calculated) : 77.6  Vital Signs: Temp: 99 F (37.2 C) (08/11 0400) Temp Source: Axillary (08/11 0400) BP: 105/48 (08/11 0400) Pulse Rate: 91 (08/11 0400)  Labs: Recent Labs    12/19/20 0518 12/19/20 1529 12/19/20 1731 12/20/20 0339  HGB 7.2*  --  9.3* 9.1*  HCT 21.7*  --  27.2* 26.6*  PLT 132*  --  170 173  APTT 43*  --  41* 44*  CREATININE 2.83* 2.65*  --  2.67*     Estimated Creatinine Clearance: 39.5 mL/min (A) (by C-G formula based on SCr of 2.67 mg/dL (H)).  Assessment: 60 yo male with RLE DVT s/p recent lumbar fusion, for heparin surgery   Goal of Therapy:  aPTT 50-70 seconds. On 8/13, liberalize to 56-85 Monitor platelets by anticoagulation protocol: Yes   Plan:  Increase Heparin 1500 units/hr aPTT in 8 hours  Phillis Knack, PharmD, BCPS  12/20/2020, 4:45 AM

## 2020-12-20 NOTE — Progress Notes (Signed)
Subjective: NAEs o/n  Objective: Vital signs in last 24 hours: Temp:  [98 F (36.7 C)-99.2 F (37.3 C)] 98 F (36.7 C) (08/11 0800) Pulse Rate:  [80-95] 83 (08/11 0930) Resp:  [15-29] 24 (08/11 0930) BP: (100-184)/(46-104) 157/76 (08/11 0930) SpO2:  [95 %-100 %] 96 % (08/11 0930) Arterial Line BP: (93-216)/(42-77) 179/65 (08/11 0930) FiO2 (%):  [40 %] 40 % (08/11 0755)  Intake/Output from previous day: 08/10 0701 - 08/11 0700 In: 3969.3 [I.V.:1194.3; Blood:690; NG/GT:2035; IV Piggyback:50] Out: [id number] [Urine:45] Intake/Output this shift: Total I/O In: 217.1 [I.V.:87.1; NG/GT:130] Out: 455 [Other:455] Currently off propofol, Fentanyl Eyes fluttering open spontaneously Intubated Does not follow commands, minimal response to pain in Ues and Les Flank incision healing well  Lab Results: Recent Labs    12/19/20 1731 12/20/20 0339  WBC 8.8 9.5  HGB 9.3* 9.1*  HCT 27.2* 26.6*  PLT 170 173   BMET Recent Labs    12/19/20 1529 12/20/20 0339  NA 134* 137  K 4.7 4.4  CL 101 103  CO2 26 26  GLUCOSE 116* 145*  BUN 32* 33*  CREATININE 2.65* 2.67*  CALCIUM 8.1* 8.3*    Studies/Results: DG CHEST PORT 1 VIEW  Result Date: 12/19/2020 CLINICAL DATA:  Respiratory failure EXAM: PORTABLE CHEST 1 VIEW COMPARISON:  Radiograph 12/16/2020 FINDINGS: Endotracheal tube tip terminates 5 cm from the carina. Transesophageal tube tip terminates in the left upper quadrant with side port just distal to the GE junction. Right IJ approach central venous catheter tip terminates in the lower SVC near the superior cavoatrial junction. Multiple skin staples project over midline with evidence of lumbar fusion hardware, incompletely assessed on this exam. Telemetry leads and external support devices overlie the chest. Lung volumes remain diminished with increasing perihilar opacities specially confluent in the right perihilar and retrocardiac space. No pneumothorax or visible layering effusion. Stable  cardiomediastinal contours accounting for differences in technique. No other acute osseous or soft tissue abnormality of the chest wall. IMPRESSION: 1. Diminishing lung volumes with increasing opacities in the perihilar and retrocardiac space. Could reflect worsening atelectasis, edema or developing airspace disease. 2. Support devices in stable, satisfactory positioning as above. Electronically Signed   By: Lovena Le M.D.   On: 12/19/2020 03:39   VAS [place] TRANSCRANIAL DOPPLER W BUBBLES  Result Date: 12/18/2020  Transcranial Doppler with Bubble Patient Name:  Keith Dixon  Date of Exam:   12/18/2020 Medical Rec #: [id number]         Accession #:    [id number] Date of Birth: 1960/10/04          Patient Gender: M Patient Age:   70 years Exam Location:  [facility] Procedure:      VAS [place] TRANSCRANIAL [place] Referring Phys: Cornelius Moras XU --------------------------------------------------------------------------------  Indications: Stroke. Comparison Study: No prior studies. Performing Technologist: Darlin Coco RDMS,RVT  Examination Guidelines: A complete evaluation includes B-mode imaging, spectral Doppler, color Doppler, and power Doppler as needed of all accessible portions of each vessel. Bilateral testing is considered an integral part of a complete examination. Limited examinations for reoccurring indications may be performed as noted.  Summary: No HITS at rest or during Valsalva. Negative transcranial Doppler Bubble study with no evidence of right to left intracardiac communication.  A vascular evaluation was performed. The left middle cerebral artery was studied. An IV was inserted into the patient's left forearm. Verbal informed consent was obtained.  *See table(s) above for TCD measurements and observations.  Preliminary    VAS US CAROTID  Result Date: 12/18/2020 Carotid Arterial Duplex Study Patient Name:  Keith Dixon  Date of Exam:   12/18/2020 Medical Rec #: [id number]          Accession #:    [id number] Date of Birth: February 02, 1961          Patient Gender: M Patient Age:   43 years Exam Location:  [facility] Procedure:      VAS US CAROTID Referring Phys: Alferd Patee [facility] --------------------------------------------------------------------------------  Indications:       CVA. Risk Factors:      Hypertension, current smoker. Limitations        Today's exam was limited due to central venous catheter right                    jugular. Comparison Study:  No prior studies. Performing Technologist: Darlin Coco RDMS,RVT  Examination Guidelines: A complete evaluation includes B-mode imaging, spectral Doppler, color Doppler, and power Doppler as needed of all accessible portions of each vessel. Bilateral testing is considered an integral part of a complete examination. Limited examinations for reoccurring indications may be performed as noted.  Left Carotid Findings: +----------+--------+--------+--------+------------------+---------------------+           PSV cm/sEDV cm/sStenosisPlaque DescriptionComments              +----------+--------+--------+--------+------------------+---------------------+ CCA Prox  226     33                                Intimal thickening,                                                       Elevated velocities                                                       without evidence of                                                       focal stenosis        +----------+--------+--------+--------+------------------+---------------------+ CCA Distal184     22                                                      +----------+--------+--------+--------+------------------+---------------------+ ICA Prox  93      38      1-39%   heterogenous                            +----------+--------+--------+--------+------------------+---------------------+ ICA Distal145     48  tortuous               +----------+--------+--------+--------+------------------+---------------------+ ECA       113     18                                                      +----------+--------+--------+--------+------------------+---------------------+ +----------+--------+--------+-----------------------------+-------------------+           PSV cm/sEDV cm/sDescribe                     Arm Pressure (mmHG) +----------+--------+--------+-----------------------------+-------------------+ [id number]             Elevated velocities without                                                evidence of focal stenosis                       +----------+--------+--------+-----------------------------+-------------------+ +---------+--------+--+--------+--+---------+ VertebralPSV cm/s87EDV cm/s25Antegrade +---------+--------+--+--------+--+---------+   Summary:  Left Carotid: Velocities in the left ICA are consistent with a 1-39% stenosis. Vertebrals:  Left vertebral artery demonstrates antegrade flow. Subclavians: Normal flow hemodynamics were seen in the left subclavian artery. *See table(s) above for measurements and observations.     Preliminary    VAS [place] TRANSCRANIAL DOPPLER  Result Date: 12/18/2020  Transcranial Doppler Patient Name:  Keith Dixon  Date of Exam:   12/18/2020 Medical Rec #: [id number]         Accession #:    [id number] Date of Birth: 1960-10-11          Patient Gender: M Patient Age:   31 years Exam Location:  [facility] Procedure:      VAS [place] TRANSCRANIAL DOPPLER Referring Phys: Cornelius Moras XU --------------------------------------------------------------------------------  Indications: Stroke. Limitations: Central venous catheter RT jugular and collar. Comparison Study: No prior studies. Performing Technologist: Darlin Coco RDMS,RVT  Examination Guidelines: A complete evaluation includes B-mode imaging, spectral Doppler, color Doppler, and power Doppler as needed of all  accessible portions of each vessel. Bilateral testing is considered an integral part of a complete examination. Limited examinations for reoccurring indications may be performed as noted.  +----------+-------------+---------+-----------+-------------------------------+ RIGHT TCD Right VM (cm)  Depth  Pulsatility            Comment                                      (cm)                                              +----------+-------------+---------+-----------+-------------------------------+ MCA           74.00                1.21                                    +----------+-------------+---------+-----------+-------------------------------+ ACA          -  26.00                0.88                                    +----------+-------------+---------+-----------+-------------------------------+ Term ICA      45.00                0.97                                    +----------+-------------+---------+-----------+-------------------------------+ PCA           43.00                0.94                                    +----------+-------------+---------+-----------+-------------------------------+ Opthalmic     20.00                1.83                                    +----------+-------------+---------+-----------+-------------------------------+ ICA siphon    88.00                1.17                                    +----------+-------------+---------+-----------+-------------------------------+ Vertebral                                     Unable to insonate due to                                                           collar              +----------+-------------+---------+-----------+-------------------------------+ Distal ICA                                  Unable to insonate due to CVC  +----------+-------------+---------+-----------+-------------------------------+   +----------+------------+----------+-----------+------------------------------+ LEFT TCD  Left VM (cm)Depth (cm)Pulsatility           Comment             +----------+------------+----------+-----------+------------------------------+ MCA          105.00                1.25                                   +----------+------------+----------+-----------+------------------------------+ ACA          -47.00                0.79                                   +----------+------------+----------+-----------+------------------------------+  Term ICA     49.00                 1.18                                   +----------+------------+----------+-----------+------------------------------+ PCA          45.00                 1.21                                   +----------+------------+----------+-----------+------------------------------+ Opthalmic    25.00                 1.90                                   +----------+------------+----------+-----------+------------------------------+ ICA siphon   48.00                 1.19                                   +----------+------------+----------+-----------+------------------------------+ Vertebral                                    Unable to insonate due to                                                           collar             +----------+------------+----------+-----------+------------------------------+ Distal ICA   47.00                 1.14                                   +----------+------------+----------+-----------+------------------------------+  +------------+-----+--------------------------------+             VM cm            Comment              +------------+-----+--------------------------------+ Prox Basilar     Unable to insonate due to collar +------------+-----+--------------------------------+ Dist Basilar     Unable to insonate due to collar  +------------+-----+--------------------------------+ +----------------------+----------------+ Right Lindegaard RatioUnable to obtain +----------------------+----------------+ +---------------------+----+ Left Lindegaard Ratio2.23 +---------------------+----+    Preliminary     Assessment/Plan: 60 yo M s/p L1-L5 DLIFs and T10-pelvis fusion for deformity and severe stenosis with postoperative renal failure, DVT, small CVAs - cont heparin gtt, goal 50-70 - daily dressing changes - discussed with his caretaker treatment plan   Vallarie Mare 12/20/2020, 9:52 AM

## 2020-12-20 NOTE — Consult Note (Signed)
Chief Complaint: Patient was seen in consultation today for tunneled hemodialysis catheter placement at the request of Dr Katheren Puller   Supervising Physician: Markus Daft  Patient Status: [facility] - In-pt  History of Present Illness: Keith Dixon is a 60 y.o. male   s/p L1-L5 DLIFs and T10-pelvis fusion for deformity and severe stenosis with postoperative renal failure, DVT, small CVAs  Dr Royce Macadamia note today: 1.  Acute kidney injury: Appears to be from a combination of ATN and rhabdomyolysis/pigment nephropathy.  He has dense renal injury.  He was started on CRRT on 8/7 for correction of multiple metabolic abnormalities and volume unloading.  Note that he also is charted as getting IV contrast on 8/7 - Continue CRRT continue UF goal of net negative 100 ml/hr as tolerated  - Do not restart CRRT if his circuit clots or cartridge expires - we will transition to intermittent HD after that point - consult IR for tunneled catheter - currently using nontunneled femoral line  Request made for tunneled HD per Nephrology  Scheduled for 8/12 in IR  Past Medical History:  Diagnosis Date   Anxiety    Arthritis    Bipolar 1 disorder, depressed ([place])    Bipolar disorder ([place])    Depression    History of posttraumatic stress disorder (PTSD)    Hypertension    PTSD (post-traumatic stress disorder)    Seizures ([place])    Sleep apnea    Smoker    Tobacco use disorder     Past Surgical History:  Procedure Laterality Date   ANTERIOR LATERAL LUMBAR FUSION 4 LEVELS N/A 12/12/2020   Procedure: Anterior Lateral Interbody Fusion Lumbar One-Two, Lumbar Two-Three, Lumbar Three-Four, Lumbar Four-Five;  Surgeon: Franchot Gallo, MD;  Location: [place];  Service: Urology;  Laterality: N/A;  Anterior Lateral Interbody Fusion Lumbar One-Two, Lumbar Two-Three, Lumbar Three-Four, Lumbar Four-Five   CYSTOSCOPY  12/12/2020   Procedure: CYSTOSCOPY, URETHRAL DILATION, DIFFICULT FOLEY INSERTION;  Surgeon: Franchot Gallo, MD;  Location: [place];  Service: Urology;;   IR IVC FILTER PLMT / S&I Burke Keels GUID/MOD SED  12/16/2020    Allergies: Patient has no known allergies.  Medications: Prior to Admission medications   Medication Sig Start Date End Date Taking? Authorizing Provider  acetaminophen (TYLENOL) 500 MG tablet Take 2 tablets (1,000 mg total) by mouth every 6 (six) hours as needed for mild pain. 04/25/19  Yes Meuth, Brooke A, PA-C  amLODipine (NORVASC) 10 MG tablet Take 10 mg by mouth in the morning.   Yes [provider]  baclofen (LIORESAL) 10 MG tablet Take 10 mg by mouth 2 (two) times daily.   Yes [provider]  Cholecalciferol (VITAMIN D3) 50 MCG (2000 UT) TABS Take 2,000 Units by mouth in the morning and at bedtime.   Yes [provider]  gabapentin (NEURONTIN) 300 MG capsule Take 900 mg by mouth 2 (two) times daily.   Yes [provider]  hydrOXYzine (VISTARIL) 50 MG capsule Take 50 mg by mouth in the morning and at bedtime.   Yes [provider]  lamoTRIgine (LAMICTAL) 100 MG tablet Take 1 tablet (100 mg total) by mouth 2 (two) times daily. 09/14/20  Yes Oswald Hillock, MD  levETIRAcetam (KEPPRA) 750 MG tablet Take 1,500 mg by mouth 2 (two) times daily.   Yes [provider]  lisinopril-hydrochlorothiazide (ZESTORETIC) 20-12.5 MG tablet Take 1 tablet by mouth in the morning.   Yes [provider]  meloxicam (MOBIC) 15 MG tablet  Take 15 mg by mouth daily after breakfast.   Yes [provider]  QUEtiapine (SEROQUEL) 200 MG tablet Take 200 mg by mouth at bedtime.   Yes [provider]  sertraline (ZOLOFT) 100 MG tablet Take 100 mg by mouth daily after breakfast.   Yes [provider]     Family History  Problem Relation Age of Onset   Seizures Mother    Breast cancer Mother 39   Hypertension Brother     Social History   Socioeconomic History   Marital status: Significant Other    Spouse name: Not on  file   Number of children: 1   Years of education: Not on file   Highest education level: Not on file  Occupational History    Employer: [employer]  Tobacco Use   Smoking status: Every Day    Packs/day: 0.50    Years: 20.00    Pack years: 10.00    Types: Cigarettes   Smokeless tobacco: Never  Vaping Use   Vaping Use: Never used  Substance and Sexual Activity   Alcohol use: Yes    Alcohol/week: 2.0 standard drinks    Types: 2 Shots of liquor per week   Drug use: Yes    Types: Marijuana    Comment: pt stated he quit 2 weeks ago 08/10/12, pt reports doing marijuana on 11/13/12   Sexual activity: Yes  Other Topics Concern   Not on file  Social History Narrative   ** Merged History Encounter **       Social Determinants of Health   Financial Resource Strain: Not on file  Food Insecurity: Not on file  Transportation Needs: Not on file  Physical Activity: Not on file  Stress: Not on file  Social Connections: Not on file      Review of Systems: A 12 point ROS discussed and pertinent positives are indicated in the HPI above.  All other systems are negative.    Vital Signs: BP (!) 157/76   Pulse 80   Temp 98 F (36.7 C) (Axillary)   Resp 20   Ht 6' (1.829 m)   Wt 253 lb 8.5 oz (115 kg)   SpO2 97%   BMI 34.38 kg/m   Physical Exam Constitutional:      Comments: Intubated No response  Cardiovascular:     Rate and Rhythm: Normal rate and regular rhythm.  Pulmonary:     Breath sounds: Normal breath sounds.  Skin:    General: Skin is warm.  Psychiatric:     Comments: Spoke to [place] She consents to procedure    Imaging: DG Lumbar Spine 2-3 Views  Result Date: 12/12/2020 CLINICAL DATA:  Lumbar fusion L1-2 L2-3, L3-4, and L4-5. EXAM: LUMBAR SPINE - 2-3 VIEW; DG C-ARM 1-60 MIN COMPARISON:  None. FINDINGS: Interbody spacers present at L1-2, L2-3, L3-4, and L4-5. No unexpected radiopaque foreign body is present. IMPRESSION: Status post lumbar fusion  without radiographic evidence for complication. Electronically Signed   By: San Morelle M.D.   On: 12/12/2020 15:22   CT HEAD WO CONTRAST (5MM)  Result Date: 12/18/2020 CLINICAL DATA:  Stroke.  Follow-up. EXAM: CT HEAD WITHOUT CONTRAST TECHNIQUE: Contiguous axial images were obtained from the base of the skull through the vertex without intravenous contrast. COMPARISON:  Head CT 2 days ago. FINDINGS: Brain: Subacute infarction in the left thalamus appears similar, without swelling or hemorrhage. No definite cerebellar infarction is confirmed. Small focal low densities in the bilateral  parietal and occipital subcortical white matter and cortical brain are more noticeable. No large confluent infarction is identified. MRI would be useful to more completely understand. No evidence of hemorrhage or shift. No hydrocephalus or extra-axial collection. Vascular: There is atherosclerotic calcification of the major vessels at the base of the brain. Skull: Negative Sinuses/Orbits: Clear/normal Other: Right scalp sebaceous cyst as seen previously. 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 Redemonstration of small cortical and subcortical infarctions in the parietal and occipital regions without evidence of hemorrhage or mass effect. Electronically Signed   By: Nelson Chimes M.D.   On: 12/18/2020 07:31   CT HEAD WO CONTRAST (5MM)  Result Date: 12/16/2020 CLINICAL DATA:  Mental status change EXAM: CT HEAD WITHOUT CONTRAST TECHNIQUE: Contiguous axial images were obtained from the base of the skull through the vertex without intravenous contrast. COMPARISON:  09/11/2020 FINDINGS: Brain: Small acute appearing infarcts seen along the bilateral cerebral cortex, including right frontal, right occipital, and left parietal. Subcortical infarcts may have also occurred and there is also a new left thalamic lacunar infarct. Suspect small bilateral cerebellar infarcts,  limitation in the posterior fossa and ventral cerebrum due to streak artifact/motion. No hemorrhage, hydrocephalus, or masslike finding. Vascular: No detected hyperdense vessel. Skull: Negative Sinuses/Orbits: Negative Dermal inclusion cyst along the right temporal scalp. Other: To expedite communication, these results will be called to the ordering clinician or representative/RN by the Radiologist Assistant, and communication documented in the PACS or [employer]. IMPRESSION: Small recent appearing bilateral cortical infarcts, left thalamic and probable bilateral cerebellar infarcts. The multifocality suggests embolic disease. Electronically Signed   By: [name] M.D.   On: 12/16/2020 10:42   IR IVC FILTER PLMT / S&I Burke Keels GUID/MOD SED  Result Date: 12/16/2020 CLINICAL DATA:  60 year old male with history of recent extensive spinal fusion surgery and diagnosis of lower extremity deep vein thrombosis, unable to tolerate anticoagulation giving bleeding from surgical site. EXAM: 1. ULTRASOUND GUIDANCE FOR VASCULAR ACCESS OF THE RIGHT internal jugular VEIN. 2. IVC VENOGRAM. 3. PERCUTANEOUS IVC FILTER PLACEMENT. ANESTHESIA/SEDATION: None. CONTRAST:  92m OMNIPAQUE IOHEXOL 300 MG/ML  SOLN FLUOROSCOPY TIME:  24 seconds, 135 mGy PROCEDURE: The procedure, risks, benefits, and alternatives were explained to the patient. Questions regarding the procedure were encouraged and answered. The patient understands and consents to the procedure. The patient was prepped with Betadine in a sterile fashion, and a sterile drape was applied covering the operative field. A sterile gown and sterile gloves were used for the procedure. Local anesthesia was provided with 1% Lidocaine. Under direct ultrasound guidance, a 21 gauge needle was advanced into the right internal jugular vein with ultrasound image documentation performed. After securing access with a micropuncture dilator, a guidewire was advanced into the inferior vena  cava. A deployment sheath was advanced over the guidewire. This was utilized to perform IVC venography. The deployment sheath was further positioned in an appropriate location for filter deployment. A Denali IVC filter was then advanced in the sheath. This was then fully deployed in the infrarenal IVC. Final filter position was confirmed with a fluoroscopic spot image. Contrast injection was also performed through the sheath under fluoroscopy to confirm patency of the IVC at the level of the filter. After the procedure the sheath was removed and hemostasis obtained with manual compression. COMPLICATIONS: None. FINDINGS: IVC venography demonstrates a normal caliber IVC with no evidence of thrombus. Renal veins are identified bilaterally. The IVC filter was successfully positioned below the  level of the renal veins and is appropriately oriented. This IVC filter has both permanent and retrievable indications. IMPRESSION: Placement of percutaneous IVC filter in the infrarenal IVC. IVC venogram shows no evidence of IVC thrombus and normal caliber of the inferior vena cava. This filter has both permanent and retrievable indications. PLAN: This IVC filter is potentially retrievable. The patient will be assessed for filter retrieval by Interventional Radiology in approximately 8-12 weeks. Further recommendations regarding filter retrieval, continued surveillance or declaration of device permanence, will be made at that time. Ruthann Cancer, MD [facility] Radiology Electronically Signed   By: Ruthann Cancer MD   On: 12/16/2020 10:27   DG CHEST PORT 1 VIEW  Result Date: 12/19/2020 CLINICAL DATA:  Respiratory failure EXAM: PORTABLE CHEST 1 VIEW COMPARISON:  Radiograph 12/16/2020 FINDINGS: Endotracheal tube tip terminates 5 cm from the carina. Transesophageal tube tip terminates in the left upper quadrant with side port just distal to the GE junction. Right IJ approach central  venous catheter tip terminates in the lower SVC near the superior cavoatrial junction. Multiple skin staples project over midline with evidence of lumbar fusion hardware, incompletely assessed on this exam. Telemetry leads and external support devices overlie the chest. Lung volumes remain diminished with increasing perihilar opacities specially confluent in the right perihilar and retrocardiac space. No pneumothorax or visible layering effusion. Stable cardiomediastinal contours accounting for differences in technique. No other acute osseous or soft tissue abnormality of the chest wall. IMPRESSION: 1. Diminishing lung volumes with increasing opacities in the perihilar and retrocardiac space. Could reflect worsening atelectasis, edema or developing airspace disease. 2. Support devices in stable, satisfactory positioning as above. Electronically Signed   By: Lovena Le M.D.   On: 12/19/2020 03:39   DG Chest Port 1 View  Result Date: 12/16/2020 CLINICAL DATA:  Ventilator dyssynchrony EXAM: PORTABLE CHEST 1 VIEW COMPARISON:  12/14/2020, 12/13/2020 FINDINGS: Endotracheal tube tip is about 2.8 cm superior to carina. Esophageal tube tip below the diaphragm but incompletely visualized. Right IJ central venous catheter tip over the SVC. Low lung volumes. Normal cardiac size. Interim development of hazy and streaky perihilar opacity with patchy right lower lung airspace disease. No pneumothorax IMPRESSION: 1. Endotracheal tube tip about 2.8 cm superior to carina 2. Low lung volumes. Interim development of streaky and hazy perihilar opacity which may be due to vascular congestion versus atypical infection. There is patchy airspace disease in the right lower lung Electronically Signed   By: Donavan Foil M.D.   On: 12/16/2020 22:33   DG CHEST PORT 1 VIEW  Result Date: 12/14/2020 CLINICAL DATA:  Central line placement EXAM: PORTABLE CHEST 1 VIEW COMPARISON:  12/13/2020 FINDINGS: Interval placement of right IJ central  venous catheter with distal tip terminating at the level of the distal SVC. ET tube terminates approximately 5.7 cm above the carina. Enteric tube terminates within the stomach. Stable heart size. Low lung volumes. No pneumothorax. Midline surgical staples. IMPRESSION: Interval placement of right IJ central venous catheter. No pneumothorax. Electronically Signed   By: Davina Poke D.O.   On: 12/14/2020 15:43   DG CHEST PORT 1 VIEW  Result Date: 12/13/2020 CLINICAL DATA:  Intubated EXAM: PORTABLE CHEST 1 VIEW COMPARISON:  09/08/2020 FINDINGS: Endotracheal tube tip is about 3.5 cm superior to the carina. Esophageal tube tip below the diaphragm. Low lung volumes. No focal opacity, pleural effusion or pneumothorax IMPRESSION: 1. Endotracheal tube tip about 3.5 cm superior to carina 2. Clear lung fields Electronically  Signed   By: Donavan Foil M.D.   On: 12/13/2020 22:26   DG Abd Portable 1V  Result Date: 12/14/2020 CLINICAL DATA:  Status post NG placement. EXAM: PORTABLE ABDOMEN - 1 VIEW COMPARISON:  Earlier radiograph dated 12/13/2020. FINDINGS: Partially visualized enteric tube with tip likely in the proximal body of the stomach. No bowel dilatation. There is extensive postsurgical changes of the spine with fusion hardware. The soft tissues are grossly unremarkable. Midline vertical cutaneous staples noted. IMPRESSION: Enteric tube with tip likely in the proximal body of the stomach. Electronically Signed   By: Anner Crete M.D.   On: 12/14/2020 01:03   DG Abd Portable 1V  Result Date: 12/13/2020 CLINICAL DATA:  OG tube placement EXAM: PORTABLE ABDOMEN - 1 VIEW COMPARISON:  12/13/2020 FINDINGS: Esophageal tube tip overlies proximal stomach, side-port in the region of GE junction. Nonobstructed upper gas pattern. IMPRESSION: Esophageal tube side-port in the region of GE junction, consider further advancement by 5-10 cm for more optimal position Electronically Signed   By: Donavan Foil M.D.   On:  12/13/2020 22:25   EEG adult  Result Date: 12/16/2020 Derek Jack, MD     12/16/2020  3:29 PM Routine EEG Report DAWOUD HOWLE is a 61 y.o. male with a history of encephalopathy who is undergoing an EEG to evaluate for seizures. Report: This EEG was acquired with electrodes placed according to the International 10-20 electrode system (including Fp1, Fp2, F3, F4, C3, C4, P3, P4, O1, O2, T3, T4, T5, T6, A1, A2, Fz, Cz, Pz). The following electrodes were missing or displaced: none. Best background was composed of frequencies of 4-6 Hz. This activity is reactive to stimulation. No sleep architecture was identified. There was no focal slowing. There were no interictal epileptiform discharges. There were no electrographic seizures identified. Photic stimulation and hyperventilation were not performed. Impression and clinical correlation: This EEG was obtained while unresponsive and is abnormal due to moderate diffuse slowing indicative of global cerebral dysfunction.   Su Monks, MD [facility] [phone] If 7pm- 7am, please page neurology on call as listed in [place].   Overnight EEG with video  Result Date: 12/16/2020 Lora Havens, MD     12/18/2020  8:11 AM Patient Name: LEVOY ISIDORE MRN: [id number] Epilepsy Attending: Lora Havens Referring Physician/Provider: Claiborne Billings, [place] Duration: 12/16/2020 1436 to 12/17/2020 1436 Patient history:  60 y.o. male with a history of encephalopathy who is undergoing an EEG to evaluate for seizures Level of alertness: lethargic AEDs during EEG study: propofol, LEV, LTG, GBP Technical aspects: This EEG study was done with scalp electrodes positioned according to the 10-20 International system of electrode placement. Electrical activity was acquired at a sampling rate of '500Hz'$  and reviewed with a high frequency filter of '70Hz'$  and a low frequency filter of '1Hz'$ . EEG data were recorded continuously and digitally stored. Description: No posterior  dominant rhythm was seen.  EEG initially showed continuous generalized 3 to 6 Hz theta-delta slowing. On 12/16/2020 after around 1830, EEG showed bursts suppression pattern with bursts of 4-'6hz'$  theta-delta slowing lasting 2-3 seconds alternating with eeg suppression lasting 8-10 seconds. Hyperventilation and photic stimulation were not performed.   EKG, artifact, significant myogenic artifact and electrode artifact was seen throughout the study. ABNORMALITY - Continuous slow, generalized - Burst suppression, generalized IMPRESSION: This technically difficult study was initially suggestive of moderate diffuse encephalopathy, nonspecific etiology. After around 1830 on 12/16/2020, eeg was suggestive of profound diffuse encephalopathy, likely due to sedation.  No seizures or epileptiform discharges were seen throughout the recording. Lora Havens   DG C-Arm 1-60 Min  Result Date: 12/12/2020 CLINICAL DATA:  Lumbar fusion L1-2 L2-3, L3-4, and L4-5. EXAM: LUMBAR SPINE - 2-3 VIEW; DG C-ARM 1-60 MIN COMPARISON:  None. FINDINGS: Interbody spacers present at L1-2, L2-3, L3-4, and L4-5. No unexpected radiopaque foreign body is present. IMPRESSION: Status post lumbar fusion without radiographic evidence for complication. Electronically Signed   By: San Morelle M.D.   On: 12/12/2020 15:22   DG C-Arm 1-60 Min-No Report  Result Date: 12/13/2020 Fluoroscopy was utilized by the requesting physician.  No radiographic interpretation.   VAS [place] TRANSCRANIAL DOPPLER W BUBBLES  Result Date: 12/18/2020  Transcranial Doppler with Bubble Patient Name:  HEWEY REDINGTON  Date of Exam:   12/18/2020 Medical Rec #: [id number]         Accession #:    [id number] Date of Birth: 1960/11/09          Patient Gender: M Patient Age:   80 years Exam Location:  [facility] Procedure:      VAS [place] TRANSCRANIAL [place] Referring Phys: Cornelius Moras XU --------------------------------------------------------------------------------   Indications: Stroke. Comparison Study: No prior studies. Performing Technologist: Darlin Coco RDMS,RVT  Examination Guidelines: A complete evaluation includes B-mode imaging, spectral Doppler, color Doppler, and power Doppler as needed of all accessible portions of each vessel. Bilateral testing is considered an integral part of a complete examination. Limited examinations for reoccurring indications may be performed as noted.  Summary: No HITS at rest or during Valsalva. Negative transcranial Doppler Bubble study with no evidence of right to left intracardiac communication.  A vascular evaluation was performed. The left middle cerebral artery was studied. An IV was inserted into the patient's left forearm. Verbal informed consent was obtained.  *See table(s) above for TCD measurements and observations.    Preliminary    ECHOCARDIOGRAM COMPLETE  Result Date: 12/14/2020    ECHOCARDIOGRAM REPORT   Patient Name:   YOSHINORI HUX Date of Exam: 12/14/2020 Medical Rec #:  [id number]        Height:       72.0 in Accession #:    [id number]       Weight:       240.0 lb Date of Birth:  [birth year]         BSA:          2.302 m Patient Age:    70 years         BP:           95/62 mmHg Patient Gender: M                HR:           71 bpm. Exam Location:  Inpatient Procedure: 2D Echo, Cardiac Doppler and Color Doppler Indications:    Cardiomegaly  History:        Patient has no prior history of Echocardiogram examinations.                 Risk Factors:Hypertension and Current Smoker.  Sonographer:    Wenda Low Referring Phys: [place]  1. Left ventricular ejection fraction, by estimation, is 65 to 70%. The left ventricle has normal function. The left ventricle has no regional wall motion abnormalities. There is moderate concentric left ventricular hypertrophy. Left ventricular diastolic parameters were normal.  2. Right ventricular systolic function is normal. The right ventricular size is  normal.  3. The mitral valve is grossly normal. No evidence of mitral valve regurgitation.  4. The aortic valve is normal in structure. There is mild calcification of the aortic valve. Aortic valve regurgitation is mild to moderate. FINDINGS  Left Ventricle: Left ventricular ejection fraction, by estimation, is 65 to 70%. The left ventricle has normal function. The left ventricle has no regional wall motion abnormalities. The left ventricular internal cavity size was small. There is moderate  concentric left ventricular hypertrophy. Left ventricular diastolic parameters were normal. Right Ventricle: The right ventricular size is normal. Right vetricular wall thickness was not well visualized. Right ventricular systolic function is normal. Left Atrium: Left atrial size was normal in size. Right Atrium: Right atrial size was normal in size. Pericardium: There is no evidence of pericardial effusion. Mitral Valve: The mitral valve is grossly normal. No evidence of mitral valve regurgitation. MV peak gradient, 3.3 mmHg. The mean mitral valve gradient is 1.0 mmHg. Tricuspid Valve: The tricuspid valve is normal in structure. Tricuspid valve regurgitation is trivial. Aortic Valve: The aortic valve is normal in structure. There is mild calcification of the aortic valve. Aortic valve regurgitation is mild to moderate. Aortic valve mean gradient measures 5.0 mmHg. Aortic valve peak gradient measures 9.1 mmHg. Aortic valve area, by VTI measures 3.15 cm. Pulmonic Valve: The pulmonic valve was normal in structure. Pulmonic valve regurgitation is not visualized. Aorta: The aortic root and ascending aorta are structurally normal, with no evidence of dilitation. IAS/Shunts: The atrial septum is grossly normal.  LEFT VENTRICLE PLAX 2D LVIDd:         4.50 cm     Diastology LVIDs:         2.60 cm     LV e' medial:    6.57 cm/s LV PW:         1.40 cm     LV E/e' medial:  5.2 LV IVS:        1.30 cm     LV e' lateral:   8.76 cm/s LVOT  diam:     2.20 cm     LV E/e' lateral: 3.9 LV SV:         73 LV SV Index:   32 LVOT Area:     3.80 cm  LV Volumes (MOD) LV vol d, MOD A2C: 54.6 ml LV vol d, MOD A4C: 73.6 ml LV vol s, MOD A2C: 27.3 ml LV vol s, MOD A4C: 33.3 ml LV SV MOD A2C:     27.3 ml LV SV MOD A4C:     73.6 ml LV SV MOD BP:      32.8 ml RIGHT VENTRICLE RV Basal diam:  5.00 cm RV Mid diam:    3.60 cm LEFT ATRIUM             Index       RIGHT ATRIUM           Index LA diam:        3.70 cm 1.61 cm/m  RA Area:     15.70 cm LA Vol (A2C):   59.0 ml 25.63 ml/m RA Volume:   33.70 ml  14.64 ml/m LA Vol (A4C):   56.7 ml 24.63 ml/m LA Biplane Vol: 57.9 ml 25.15 ml/m  AORTIC VALVE AV Area (Vmax):    2.95 cm AV Area (Vmean):   2.59 cm AV Area (VTI):     3.15 cm AV Vmax:           151.00  cm/s AV Vmean:          107.000 cm/s AV VTI:            0.232 m AV Peak Grad:      9.1 mmHg AV Mean Grad:      5.0 mmHg LVOT Vmax:         117.00 cm/s LVOT Vmean:        72.900 cm/s LVOT VTI:          0.192 m LVOT/AV VTI ratio: 0.83  AORTA Ao Root diam: 3.10 cm MITRAL VALVE MV Area (PHT): 2.57 cm    SHUNTS MV Area VTI:   4.08 cm    Systemic VTI:  0.19 m MV Peak grad:  3.3 mmHg    Systemic Diam: 2.20 cm MV Mean grad:  1.0 mmHg MV Vmax:       0.91 m/s MV Vmean:      38.7 cm/s MV Decel Time: 296 msec MV E velocity: 34.15 cm/s MV A velocity: 52.90 cm/s MV E/A ratio:  0.65 Mertie Moores MD Electronically signed by Mertie Moores MD Signature Date/Time: 12/14/2020/4:25:28 PM    Final    CT OArm Limited Study-No Report  Result Date: 12/14/2020 Fluoroscopy was utilized by the requesting physician.  No radiographic interpretation.   VAS US CAROTID  Result Date: 12/18/2020 Carotid Arterial Duplex Study Patient Name:  HARU OBANNON  Date of Exam:   12/18/2020 Medical Rec #: [id number]         Accession #:    [id number] Date of Birth: Jan 31, 1961          Patient Gender: M Patient Age:   27 years Exam Location:  [facility] Procedure:      VAS US CAROTID Referring  Phys: Alferd Patee [facility] --------------------------------------------------------------------------------  Indications:       CVA. Risk Factors:      Hypertension, current smoker. Limitations        Today's exam was limited due to central venous catheter right                    jugular. Comparison Study:  No prior studies. Performing Technologist: Darlin Coco RDMS,RVT  Examination Guidelines: A complete evaluation includes B-mode imaging, spectral Doppler, color Doppler, and power Doppler as needed of all accessible portions of each vessel. Bilateral testing is considered an integral part of a complete examination. Limited examinations for reoccurring indications may be performed as noted.  Left Carotid Findings: +----------+--------+--------+--------+------------------+---------------------+           PSV cm/sEDV cm/sStenosisPlaque DescriptionComments              +----------+--------+--------+--------+------------------+---------------------+ CCA Prox  226     33                                Intimal thickening,                                                       Elevated velocities  without evidence of                                                       focal stenosis        +----------+--------+--------+--------+------------------+---------------------+ CCA Distal184     22                                                      +----------+--------+--------+--------+------------------+---------------------+ ICA Prox  93      38      1-39%   heterogenous                            +----------+--------+--------+--------+------------------+---------------------+ ICA Distal145     48                                tortuous              +----------+--------+--------+--------+------------------+---------------------+ ECA       113     18                                                       +----------+--------+--------+--------+------------------+---------------------+ +----------+--------+--------+-----------------------------+-------------------+           PSV cm/sEDV cm/sDescribe                     Arm Pressure (mmHG) +----------+--------+--------+-----------------------------+-------------------+ [id number]             Elevated velocities without                                                evidence of focal stenosis                       +----------+--------+--------+-----------------------------+-------------------+ +---------+--------+--+--------+--+---------+ VertebralPSV cm/s87EDV cm/s25Antegrade +---------+--------+--+--------+--+---------+   Summary:  Left Carotid: Velocities in the left ICA are consistent with a 1-39% stenosis. Vertebrals:  Left vertebral artery demonstrates antegrade flow. Subclavians: Normal flow hemodynamics were seen in the left subclavian artery. *See table(s) above for measurements and observations.     Preliminary    VAS [place] LOWER EXTREMITY VENOUS (DVT)  Result Date: 12/14/2020  Lower Venous DVT Study Patient Name:  SVANIK GEISER  Date of Exam:   12/14/2020 Medical Rec #: [id number]         Accession #:    [id number] Date of Birth: 1961/05/02          Patient Gender: M Patient Age:   53 years Exam [facility]:  [facility] Procedure:      VAS [place] LOWER EXTREMITY VENOUS (DVT) Referring Phys: [facility] --------------------------------------------------------------------------------  Indications: Swelling, and recent extensive spinal surgery.  Comparison Study: No prior studies. Performing Technologist: Maudry Mayhew MHA, RDMS, RVT, RDCS  Examination Guidelines: A complete evaluation includes  B-mode imaging, spectral Doppler, color Doppler, and power Doppler as needed of all accessible portions of each vessel. Bilateral testing is considered an integral part of a complete examination. Limited examinations for reoccurring  indications may be performed as noted. The reflux portion of the exam is performed with the patient in reverse Trendelenburg.  +---------+---------------+---------+-----------+----------+--------------+ RIGHT    CompressibilityPhasicitySpontaneityPropertiesThrombus Aging +---------+---------------+---------+-----------+----------+--------------+ CFV      Full           Yes      Yes                                 +---------+---------------+---------+-----------+----------+--------------+ SFJ      Full                                                        +---------+---------------+---------+-----------+----------+--------------+ FV Prox  Full                                                        +---------+---------------+---------+-----------+----------+--------------+ FV Mid   Full                                                        +---------+---------------+---------+-----------+----------+--------------+ FV DistalFull                                                        +---------+---------------+---------+-----------+----------+--------------+ PFV      None                    No                   Acute          +---------+---------------+---------+-----------+----------+--------------+ POP      Full           Yes      Yes                                 +---------+---------------+---------+-----------+----------+--------------+ PTV      Full                                                        +---------+---------------+---------+-----------+----------+--------------+ PERO     Full                                                        +---------+---------------+---------+-----------+----------+--------------+  Gastroc  None                    No                   Acute          +---------+---------------+---------+-----------+----------+--------------+ GSV      None                                         Acute           +---------+---------------+---------+-----------+----------+--------------+   +---------+---------------+---------+-----------+----------+--------------+ LEFT     CompressibilityPhasicitySpontaneityPropertiesThrombus Aging +---------+---------------+---------+-----------+----------+--------------+ CFV      Full           Yes      Yes                                 +---------+---------------+---------+-----------+----------+--------------+ SFJ      Full                                                        +---------+---------------+---------+-----------+----------+--------------+ FV Prox  Full                                                        +---------+---------------+---------+-----------+----------+--------------+ FV Mid   Full                                                        +---------+---------------+---------+-----------+----------+--------------+ FV DistalFull                                                        +---------+---------------+---------+-----------+----------+--------------+ PFV      Full                                                        +---------+---------------+---------+-----------+----------+--------------+ POP      Full           Yes      Yes                                 +---------+---------------+---------+-----------+----------+--------------+ PTV      Full                                                        +---------+---------------+---------+-----------+----------+--------------+  PERO     Full                                                        +---------+---------------+---------+-----------+----------+--------------+     Summary: RIGHT: - Findings consistent with acute deep vein thrombosis involving the right proximal profunda vein, and right gastrocnemius veins. - Findings consistent with acute superficial vein thrombosis involving the right great saphenous vein. - No cystic structure  found in the popliteal fossa.  LEFT: - There is no evidence of deep vein thrombosis in the lower extremity.  - No cystic structure found in the popliteal fossa.  *See table(s) above for measurements and observations. Electronically signed by Jamelle Haring on 12/14/2020 at 5:31:08 PM.    Final    VAS [place] TRANSCRANIAL DOPPLER  Result Date: 12/18/2020  Transcranial Doppler Patient Name:  DREON VACHA  Date of Exam:   12/18/2020 Medical Rec #: [id number]         Accession #:    [id number] Date of Birth: 05-27-1960          Patient Gender: M Patient Age:   72 years Exam Location:  [facility] Procedure:      VAS [place] TRANSCRANIAL DOPPLER Referring Phys: Rosalin Hawking --------------------------------------------------------------------------------  Indications: Stroke. Limitations: Central venous catheter RT jugular and collar. Comparison Study: No prior studies. Performing Technologist: Darlin Coco RDMS,RVT  Examination Guidelines: A complete evaluation includes B-mode imaging, spectral Doppler, color Doppler, and power Doppler as needed of all accessible portions of each vessel. Bilateral testing is considered an integral part of a complete examination. Limited examinations for reoccurring indications may be performed as noted.  +----------+-------------+---------+-----------+-------------------------------+ RIGHT TCD Right VM (cm)  Depth  Pulsatility            Comment                                      (cm)                                              +----------+-------------+---------+-----------+-------------------------------+ MCA           74.00                1.21                                    +----------+-------------+---------+-----------+-------------------------------+ ACA          -26.00                0.88                                    +----------+-------------+---------+-----------+-------------------------------+ Term ICA      45.00                0.97                                     +----------+-------------+---------+-----------+-------------------------------+  PCA           43.00                0.94                                    +----------+-------------+---------+-----------+-------------------------------+ Opthalmic     20.00                1.83                                    +----------+-------------+---------+-----------+-------------------------------+ ICA siphon    88.00                1.17                                    +----------+-------------+---------+-----------+-------------------------------+ Vertebral                                     Unable to insonate due to                                                           collar              +----------+-------------+---------+-----------+-------------------------------+ Distal ICA                                  Unable to insonate due to CVC  +----------+-------------+---------+-----------+-------------------------------+  +----------+------------+----------+-----------+------------------------------+ LEFT TCD  Left VM (cm)Depth (cm)Pulsatility           Comment             +----------+------------+----------+-----------+------------------------------+ MCA          105.00                1.25                                   +----------+------------+----------+-----------+------------------------------+ ACA          -47.00                0.79                                   +----------+------------+----------+-----------+------------------------------+ Term ICA     49.00                 1.18                                   +----------+------------+----------+-----------+------------------------------+ PCA          45.00                 1.21                                   +----------+------------+----------+-----------+------------------------------+  Opthalmic    25.00                 1.90                                    +----------+------------+----------+-----------+------------------------------+ ICA siphon   48.00                 1.19                                   +----------+------------+----------+-----------+------------------------------+ Vertebral                                    Unable to insonate due to                                                           collar             +----------+------------+----------+-----------+------------------------------+ Distal ICA   47.00                 1.14                                   +----------+------------+----------+-----------+------------------------------+  +------------+-----+--------------------------------+             VM cm            Comment              +------------+-----+--------------------------------+ Prox Basilar     Unable to insonate due to collar +------------+-----+--------------------------------+ Dist Basilar     Unable to insonate due to collar +------------+-----+--------------------------------+ +----------------------+----------------+ Right Lindegaard RatioUnable to obtain +----------------------+----------------+ +---------------------+----+ Left Lindegaard Ratio2.23 +---------------------+----+    Preliminary     Labs:  CBC: Recent Labs    12/18/20 1744 12/19/20 0327 12/19/20 0518 12/19/20 1731 12/20/20 0339  WBC 7.5  --  7.1 8.8 9.5  HGB 7.3* 6.1* 7.2* 9.3* 9.1*  HCT 22.0* 18.0* 21.7* 27.2* 26.6*  PLT 128*  --  132* 170 173    COAGS: Recent Labs    09/08/20 1136 12/16/20 1042 12/18/20 1009 12/18/20 2033 12/19/20 0518 12/19/20 1731 12/20/20 0339  INR 1.0 1.3*  --   --   --   --   --   APTT 20* 31   < > 38* 43* 41* 44*   < > = values in this interval not displayed.    BMP: Recent Labs    12/18/20 1600 12/19/20 0327 12/19/20 0518 12/19/20 1529 12/20/20 0339  NA 133* 133* 135 134* 137  K 4.4 4.8 4.6 4.7 4.4  CL 102  --  102 101 103  CO2 24  --  '26  26 26  '$ GLUCOSE 127*  --  121* 116* 145*  BUN 32*  --  31* 32* 33*  CALCIUM 7.8*  --  8.0* 8.1* 8.3*  CREATININE 3.13*  --  2.83* 2.65* 2.67*  GFRNONAA 22*  --  25* 27* 27*    LIVER FUNCTION TESTS: Recent Labs    12/05/20 1258 12/13/20 0740  12/14/20 1745 12/16/20 0509 12/18/20 1600 12/19/20 0518 12/19/20 1529 12/20/20 0339  BILITOT 0.4 1.6* 0.7  --   --  0.7  --   --   AST 22 45* 319*  --   --  79*  --   --   ALT 20 21 145*  --   --  9  --   --   ALKPHOS 62 78 63  --   --  294*  --   --   PROT 6.4* 7.0 4.7*  --   --  5.0*  --   --   ALBUMIN 3.8 4.0 2.5*   < > 1.6* 1.6* 1.7* 1.6*   < > = values in this interval not displayed.    TUMOR MARKERS: No results for input(s): AFPTM, CEA, CA199, CHROMGRNA in the last 8760 hours.  Assessment and Plan:  AKI-- using femoral cath now-- CRRT since 12/16/20 Scheduled for tunneled HD catheter placement in IR 8/12 Risks and benefits discussed with the patient's Crystal via phone including, but not limited to bleeding, infection, vascular injury, pneumothorax which may require chest tube placement, air embolism or even death  All questions were answered, POA is agreeable to proceed. Consent signed and in chart.   Thank you for this interesting consult.  I greatly enjoyed meeting PROCOPIO ROVNER and look forward to participating in their care.  A copy of this report was sent to the requesting provider on this date.  Electronically Signed: Lavonia Drafts, PA-C 12/20/2020, 11:14 AM   I spent a total of 20 Minutes    in face to face in clinical consultation, greater than 50% of which was counseling/coordinating care for Tunneled HD catheter placement

## 2020-12-20 NOTE — Progress Notes (Addendum)
NAME:  Keith Dixon, MRN:  [id number], DOB:  [birth year], LOS: 69 ADMISSION DATE:  12/12/2020, CONSULTATION DATE:  12/20/20  REFERRING MD:  Grandville Silos, CHIEF COMPLAINT:   respiratory depression  History of Present Illness:  Keith Dixon is a 60 y/o M with PMH significant for severe degenerative scoliosis with multi-level listhesis, anxiety, bipolar disorder, PTSD, seizures, OSA and tobacco use who was admitted to neurosurgery for L1-2, L2-3, L3-4, L4-5 DLIFs initially and subsequent T10-pelvis decompression and fusion.   His initial surgery was 8/3 and was slow to wake up, after second  surgery 8/4  (10hr procedure) pt was again slow to awaken post-anesthesia, so decision was made to leave patient intubated overnight.    Significant Hospital Events:  8/3 Admit to neurosurgery, underwent  anterior lateral Interbody fusion of L1-L5, notably had difficult foley insertion requiring urethral dilation 8/4 Stage 2 posterior decompression and fusion, left intubated post-procedure and PCCM consulted 8/5 AKI seen on am labs with evidence of volume depletion on POC [place] 8/6: Patient AKI was due to acute rhabdomyolysis in the setting of prolonged surgery, he remained oliguric.  Ultrasound lower extremity showed acute right lower extremity DVT and left IJ DVT, IVC filter placed 8/7: Patient serum creatinine continues to trend up, he remained oliguric, started on CRRT Head CT showed small multiple bilateral strokes.  EEG was placed  8/9>. No seizures per EEG, remains on Levo, Propofol and fentanyl with good vent synchrony. Remains on CVVH with UF goal of 50 cc per hour, but increasing pressor needs this am  8/11: off pressors, mental status precluding extubation  Interim History / Subjective:  No significant overnight events.  Continues to have copious secretions from the ET tube.  Objective   Blood pressure (!) 117/49, pulse 84, temperature 99 F (37.2 C), temperature source Axillary, resp. rate (!) 28,  height 6' (1.829 m), weight 115 kg, SpO2 99 %.    Vent Mode: PRVC FiO2 (%):  [40 %] 40 % Set Rate:  [28 bmp] 28 bmp Vt Set:  [620 mL] 620 mL PEEP:  [5 cmH20] 5 cmH20 Plateau Pressure:  [18 cmH20-21 cmH20] 19 cmH20   Intake/Output Summary (Last 24 hours) at 12/20/2020 0713 Last data filed at 12/20/2020 0700 Gross per 24 hour  Intake 3969.32 ml  Output 6679 ml  Net -2709.68 ml    Filed Weights   12/16/20 0500 12/18/20 0900 12/19/20 0348  Weight: 101.7 kg 115.9 kg 115 kg   Physical Exam  General: Critically ill-appearing middle-aged African-American male, lying in the bed, orally intubated  HEENT: Atraumatic, normocephalic ETT, MM pink/moist, Neuro: Off sedation, eyes are open.  He does not look towards voice.  He does not follow commands.  Pupils are equal round and reactive to light.  Gag reflex intact. CV: Regular rate and rhythm.  +1 lower extremity edema extending presacral PULM: Intubated.  Breathing comfortably on pressure support.  Bilateral rhonchi GI: Soft, not tender Skin: Deep pressure injury to the left heel  Assessment & Plan:   Acute post operative hypoxic and hypercapnic respiratory respiratory failure requiring mechanical ventilation  Vent day #9 Rare gram-negative's on 8/9 respiratory culture Plan -d/c sedation -Mental status continues to preclude extubation.  Continue pressure support as tolerated. -Follow respiratory culture -Discussed the potential need for a tracheostomy.  We will plan to monitor him throughout the weekend to see if we can extubate and if not, would plan on a tracheostomy on Monday. -VAP bundle  Acute rhabdomyolysis secondary to prolonged surgery  and hypotension.  Improving--CK 14,000>> 1700 RRT dependent Acute Oliguric renal failure secondary to rhabdomyolysis and ATN 24h urine output 45cc Volume status improving--6.6L off with CRRT yesterday Electrolytes stable Plan -Nephrology following - We will plan to get IR to place a tunneled  HD line tomorrow and get him transitioned over to intermittent HD when able  Acute right lower extremity DVT, acute left IJ DVT s/p IVC filter placement -continue heparin gtt  Acute on chronic anemia. S/p total of 6U PRBC transfused 8/4-8/10. Hemoglobin stable this morning so will continue heparin for now. 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 No embolic source identified--no thrombus on echocardiogram, TCD negative for PFO. Unfortunately, renal failure is precluding CTA for further evaluation for intracranial stenosis.  Mental status/ sedation has precluded complete neurologic evaluation  Acute metabolic encephalopathy Plan -Neurology following -on heparin gtt for DVT  Hypertension -Resume home amlodipine - As needed labetalol for SBP greater than 180 mgHg  Hx of seizure disorder. No epileptic activity on EEG -continue home keppra and lamictal  Lumbar spinal stenosis s/p decompression and fusion -management per primary -Continue as needed oxycodone with as needed fentanyl pushes  High risk malnutrition -continue TF -Core track ordered  Deep pressure injury of left heel. - DPI protocol  Difficult to place Foley - DC Foley  Best Practice    Diet/type: NPO, TF DVT prophylaxis: heparin gtt GI prophylaxis: PPI Lines: Yes, still needed Foley: Yes, still needed Code Status:  full code Last date of multidisciplinary goals of care discussion: 8/11 patient's family was updated at [place], MD Internal Medicine Resident PGY-3 [facility] Pager: [phone] 12/20/2020 8:00 AM

## 2020-12-20 NOTE — Progress Notes (Signed)
ANTICOAGULATION CONSULT NOTE  Pharmacy Consult for heparin Indication: DVT  No Known Allergies  Patient Measurements: Height: 6' (182.9 cm) Weight: 115 kg (253 lb 8.5 oz) IBW/kg (Calculated) : 77.6  Vital Signs: Temp: 98.9 F (37.2 C) (08/11 1200) Temp Source: Axillary (08/11 1200) BP: 163/68 (08/11 1200) Pulse Rate: 92 (08/11 1200)  Labs: Recent Labs    12/19/20 0518 12/19/20 1529 12/19/20 1731 12/20/20 0339 12/20/20 1313  HGB 7.2*  --  9.3* 9.1*  --   HCT 21.7*  --  27.2* 26.6*  --   PLT 132*  --  170 173  --   APTT 43*  --  41* 44* 44*  CREATININE 2.83* 2.65*  --  2.67*  --   CKTOTAL  --   --   --  1,736*  --      Estimated Creatinine Clearance: 39.5 mL/min (A) (by C-G formula based on SCr of 2.67 mg/dL (H)).  Assessment: 60 yo male with RLE DVT s/p recent lumbar fusion, for heparin surgery   Repeat aPTT remains subtherapeutic at 44 despite rate increase to 1500 units/hr. No s/s of overt bleeding noted.   Goal of Therapy:  aPTT 50-70 seconds. On 8/13, liberalize to 56-85 Monitor platelets by anticoagulation protocol: Yes   Plan:  Increase Heparin 1650 units/hr aPTT in 8 hours  Albertina Parr, PharmD., BCPS, BCCCP Clinical Pharmacist Please refer to [facility] for unit-specific pharmacist

## 2020-12-20 NOTE — Plan of Care (Signed)
  Problem: Nutrition: Goal: Adequate nutrition will be maintained Outcome: Progressing   Problem: Elimination: Goal: Will not experience complications related to bowel motility Outcome: Progressing   

## 2020-12-20 NOTE — Progress Notes (Signed)
1450: Noted a swollen area adjacent to upper portion of back incision draining serosanguinous fluid. Called and informed MD Duffy Rhody.   1515: Pt continues to require high dose of fentanyl + boluses with minimal relief of pt coughing, tachypnea, and vent asynchrony. Pt's BP cannot tolerate precedex gtt. Pt experienced 2x episodes of emesis. OG tube found to be out. Repositioned OG, abd XR confirmed placement. MD Agarwala called, made aware of updates and is agreeable to continue with fentanyl gtt vs pain control with IV pushes.   1530: Found new hematoma mid nipple line 29x6 cm. Area marked. MD Rylee Darrick Meigs brought to bedside to visualize area. No new orders.

## 2020-12-20 NOTE — Progress Notes (Signed)
VAST RN at bedside to assess medial lumen of CVC. Blood noted in the line, unable to flush or draw back. Multiple attempts to flush line and change cap unsuccessful, thus unable to instill cath-flow. Dead-end cap placed on occluded lumen. Primary RN aware.

## 2020-12-21 ENCOUNTER — Inpatient Hospital Stay (HOSPITAL_COMMUNITY): Payer: No Typology Code available for payment source

## 2020-12-21 DIAGNOSIS — J9601 Acute respiratory failure with hypoxia: Secondary | ICD-10-CM | POA: Diagnosis not present

## 2020-12-21 DIAGNOSIS — J9602 Acute respiratory failure with hypercapnia: Secondary | ICD-10-CM | POA: Diagnosis not present

## 2020-12-21 DIAGNOSIS — J9690 Respiratory failure, unspecified, unspecified whether with hypoxia or hypercapnia: Secondary | ICD-10-CM

## 2020-12-21 HISTORY — PX: IR FLUORO GUIDE CV LINE RIGHT: IMG2283

## 2020-12-21 HISTORY — PX: IR US GUIDE VASC ACCESS RIGHT: IMG2390

## 2020-12-21 LAB — CBC
HCT: 27 % — ABNORMAL LOW (ref 39.0–52.0)
Hemoglobin: 8.8 g/dL — ABNORMAL LOW (ref 13.0–17.0)
MCH: 27.8 pg (ref 26.0–34.0)
MCHC: 32.6 g/dL (ref 30.0–36.0)
MCV: 85.2 fL (ref 80.0–100.0)
Platelets: 211 10*3/uL (ref 150–400)
RBC: 3.17 MIL/uL — ABNORMAL LOW (ref 4.22–5.81)
RDW: 16.8 % — ABNORMAL HIGH (ref 11.5–15.5)
WBC: 13.4 10*3/uL — ABNORMAL HIGH (ref 4.0–10.5)
nRBC: 0 % (ref 0.0–0.2)

## 2020-12-21 LAB — APTT
aPTT: 39 seconds — ABNORMAL HIGH (ref 24–36)
aPTT: 31 seconds (ref 24–36)

## 2020-12-21 LAB — RENAL FUNCTION PANEL
Albumin: 1.7 g/dL — ABNORMAL LOW (ref 3.5–5.0)
Albumin: 1.7 g/dL — ABNORMAL LOW (ref 3.5–5.0)
Anion gap: 10 (ref 5–15)
Anion gap: 8 (ref 5–15)
BUN: 48 mg/dL — ABNORMAL HIGH (ref 6–20)
BUN: 63 mg/dL — ABNORMAL HIGH (ref 6–20)
CO2: 23 mmol/L (ref 22–32)
CO2: 25 mmol/L (ref 22–32)
Calcium: 8.5 mg/dL — ABNORMAL LOW (ref 8.9–10.3)
Calcium: 8.6 mg/dL — ABNORMAL LOW (ref 8.9–10.3)
Chloride: 101 mmol/L (ref 98–111)
Chloride: 102 mmol/L (ref 98–111)
Creatinine, Ser: 3.06 mg/dL — ABNORMAL HIGH (ref 0.61–1.24)
Creatinine, Ser: 4.18 mg/dL — ABNORMAL HIGH (ref 0.61–1.24)
GFR, Estimated: 23 mL/min — ABNORMAL LOW (ref >60)
GFR, Estimated: 16 mL/min — ABNORMAL LOW (ref >60)
Glucose, Bld: 121 mg/dL — ABNORMAL HIGH (ref 70–99)
Glucose, Bld: 107 mg/dL — ABNORMAL HIGH (ref 70–99)
Phosphorus: 2.5 mg/dL (ref 2.5–4.6)
Phosphorus: 3.7 mg/dL (ref 2.5–4.6)
Potassium: 4.6 mmol/L (ref 3.5–5.1)
Potassium: 5.1 mmol/L (ref 3.5–5.1)
Sodium: 134 mmol/L — ABNORMAL LOW (ref 135–145)
Sodium: 135 mmol/L (ref 135–145)

## 2020-12-21 LAB — GLUCOSE, CAPILLARY
Glucose-Capillary: 109 mg/dL — ABNORMAL HIGH (ref 70–99)
Glucose-Capillary: 111 mg/dL — ABNORMAL HIGH (ref 70–99)
Glucose-Capillary: 113 mg/dL — ABNORMAL HIGH (ref 70–99)
Glucose-Capillary: 121 mg/dL — ABNORMAL HIGH (ref 70–99)
Glucose-Capillary: 125 mg/dL — ABNORMAL HIGH (ref 70–99)
Glucose-Capillary: 133 mg/dL — ABNORMAL HIGH (ref 70–99)

## 2020-12-21 LAB — MAGNESIUM: Magnesium: 2.6 mg/dL — ABNORMAL HIGH (ref 1.7–2.4)

## 2020-12-21 MED ORDER — CEFAZOLIN SODIUM-DEXTROSE 2-4 GM/100ML-% IV SOLN
INTRAVENOUS | Status: AC
  Filled 2020-12-21: qty 100

## 2020-12-21 MED ORDER — CEFAZOLIN SODIUM-DEXTROSE 2-4 GM/100ML-% IV SOLN
INTRAVENOUS | Status: AC | PRN
  Administered 2020-12-21: 2 g via INTRAVENOUS

## 2020-12-21 MED ORDER — CIPROFLOXACIN HCL 0.3 % OP SOLN
1.0000 [drp] | OPHTHALMIC | Status: DC
  Administered 2020-12-21 – 2021-01-05 (×72): 1 [drp] via OPHTHALMIC
  Filled 2020-12-21 (×2): qty 2.5

## 2020-12-21 MED ORDER — CHLORHEXIDINE GLUCONATE CLOTH 2 % EX PADS
6.0000 | MEDICATED_PAD | Freq: Every day | CUTANEOUS | Status: DC
  Administered 2020-12-22: 6 via TOPICAL

## 2020-12-21 MED ORDER — MIDAZOLAM HCL 2 MG/2ML IJ SOLN
INTRAMUSCULAR | Status: AC | PRN
  Administered 2020-12-21: 1 mg via INTRAVENOUS

## 2020-12-21 MED ORDER — HEPARIN (PORCINE) 25000 UT/250ML-% IV SOLN
2600.0000 [IU]/h | INTRAVENOUS | Status: DC
  Administered 2020-12-21: 2000 [IU]/h via INTRAVENOUS
  Administered 2020-12-22: 2400 [IU]/h via INTRAVENOUS
  Administered 2020-12-23 – 2020-12-24 (×3): 2600 [IU]/h via INTRAVENOUS
  Filled 2020-12-21 (×7): qty 250

## 2020-12-21 MED ORDER — HEPARIN SODIUM (PORCINE) 1000 UNIT/ML IJ SOLN
INTRAMUSCULAR | Status: AC
  Administered 2020-12-21: 3.2 mL
  Filled 2020-12-21: qty 1

## 2020-12-21 MED ORDER — LIDOCAINE HCL (PF) 1 % IJ SOLN
INTRAMUSCULAR | Status: AC | PRN
Start: 2020-12-21 — End: 2020-12-21
  Administered 2020-12-21: 10 mL

## 2020-12-21 MED ORDER — MIDAZOLAM HCL 2 MG/2ML IJ SOLN
INTRAMUSCULAR | Status: AC
  Filled 2020-12-21: qty 2

## 2020-12-21 MED ORDER — LIDOCAINE HCL 1 % IJ SOLN
INTRAMUSCULAR | Status: AC
  Filled 2020-12-21: qty 20

## 2020-12-21 NOTE — Progress Notes (Signed)
Heparin gtt had not been stopped. Pt has procedure planned for today, and has been bleeding overnight. Discussed with CCM, Pharmacy, and IR team. Order to hold heparin per Dr Vernard Gambles. Gtt stopped at 0810. Pt taken to IR at 0910, handoff with Sherrie Mustache RN.

## 2020-12-21 NOTE — Progress Notes (Signed)
RT transported patient from 4N19 to IR and back with RN. No complications and vital signs stable. RT will continue to monitor.

## 2020-12-21 NOTE — Progress Notes (Signed)
[place] for heparin Indication: DVT  No Known Allergies  Patient Measurements: Height: 6' (182.9 cm) Weight: 107.9 kg (237 lb 14 oz) IBW/kg (Calculated) : 77.6  Vital Signs: Temp: 99.3 F (37.4 C) (08/12 0800) Temp Source: Oral (08/12 0800) BP: 124/84 (08/12 0600) Pulse Rate: 105 (08/12 0600)  Labs: Recent Labs    12/19/20 1731 12/20/20 0339 12/20/20 1313 12/20/20 1601 12/20/20 2119 12/21/20 0517 12/21/20 0613  HGB 9.3* 9.1*  --   --   --  8.8*  --   HCT 27.2* 26.6*  --   --   --  27.0*  --   PLT 170 173  --   --   --  211  --   APTT 41* 44* 44*  --  47*  --  39*  CREATININE  --  2.67*  --  2.47*  --  3.06*  --   CKTOTAL  --  1,736*  --   --   --   --   --      Estimated Creatinine Clearance: 33.4 mL/min (A) (by C-G formula based on SCr of 3.06 mg/dL (H)).  Assessment: 60 yo male with RLE DVT s/p recent lumbar fusion, for heparin surgery. Patient has been on CRRT until yesterday with heparin running in the CRRT circuit at 750 units/hr. CRRT and circuit heparin were d/c'ed on 8/11.   aPTT this AM on 1800 units/hr of IV heparin was down to 39 likely due to stoppage of CRRT heparin. Patient had issues with bleeding near the wound incision site but this has stopped. Neurosurgery has assessed the patient and is okay with continuing heparin. Of note, heparin was stopped this AM for tunneled HD catheter placement.   Goal of Therapy:  aPTT 50-70 seconds. On 8/13, liberalize to 56-85 Monitor platelets by anticoagulation protocol: Yes   Plan:  Per IR, okay to resume IV heparin 1 hour after procedure.  Start heparin at 2000 units at 11:30 AM. No bolus  Repeat aPTT 8 hour post heparin start Monitor closely for any s/s of bleeding near incision site.    Albertina Parr, PharmD., BCPS, BCCCP Clinical Pharmacist Please refer to [facility] for unit-specific pharmacist

## 2020-12-21 NOTE — Progress Notes (Addendum)
0130: Midline incision to pt back noted to be swollen with significant sanguinous drainage. Dressing was completely saturated. Dressing was CDI at 2000 assessment. With coughing, blood would pour out of incision. NP Meyran texted. Awaiting response. Dressing changed. RN will continue to monitor.  0220: Manual pressure applied until bleeding stopped per Maudie Mercury, NP. Pressure dressing applied under gauze dressing.

## 2020-12-21 NOTE — Progress Notes (Signed)
Subjective: Serosanguinous drainage from wound noted by nurses.  Currently no drainage  Objective: Vital signs in last 24 hours: Temp:  [98.8 F (37.1 C)-101.2 F (38.4 C)] 99 F (37.2 C) (08/12 0400) Pulse Rate:  [79-107] 105 (08/12 0600) Resp:  [16-34] 28 (08/12 0600) BP: (107-188)/(50-84) 124/84 (08/12 0600) SpO2:  [93 %-100 %] 97 % (08/12 0754) Arterial Line BP: (114-207)/(20-69) 128/67 (08/12 0600) FiO2 (%):  [40 %] 40 % (08/12 0754) Weight:  [107.9 kg] 107.9 kg (08/12 0400)  Intake/Output from previous day: 08/11 0701 - 08/12 0700 In: 2605.2 [I.V.:910.1; NG/GT:1275; IV Piggyback:420.1] Out: 2949 [Urine:50] Intake/Output this shift: No intake/output data recorded.  Remains intubated Eyes open spontaneously FC x 4 but very deconditioned, 2/5 strength.  Lab Results: Recent Labs    12/20/20 0339 12/21/20 0517  WBC 9.5 13.4*  HGB 9.1* 8.8*  HCT 26.6* 27.0*  PLT 173 211   BMET Recent Labs    12/20/20 1601 12/21/20 0517  NA 136 134*  K 4.9 4.6  CL 103 101  CO2 26 23  GLUCOSE 131* 121*  BUN 36* 48*  CREATININE 2.47* 3.06*  CALCIUM 8.5* 8.5*    Studies/Results: DG Abd Portable 1V  Result Date: 12/20/2020 CLINICAL DATA:  Orogastric tube placement. EXAM: PORTABLE ABDOMEN - 1 VIEW COMPARISON:  December 14, 2020 FINDINGS: Orogastric with tip and side port overlying the stomach. The bowel gas pattern is normal. Fusion hardware extends from the lower lumbar spine into the sacrum. Cutaneous skin staples project over the midline abdomen. IMPRESSION: Orogastric tube tip and side port overlie the stomach. Electronically Signed   By: Dahlia Bailiff MD   On: 12/20/2020 16:39    Assessment/Plan: 60 yo M s/p L1-L5 DLIFs and T10-pelvis fusion for deformity and severe stenosis with postoperative renal failure, DVT, small CVAs - cont heparin gtt, goal 50-70 - daily dressing changes - WBC up today; will continue to monitor wound   Vallarie Mare 12/21/2020, 8:19  AM

## 2020-12-21 NOTE — Progress Notes (Addendum)
[place] for heparin Indication: DVT  No Known Allergies  Patient Measurements: Height: 6' (182.9 cm) Weight: 107.9 kg (237 lb 14 oz) IBW/kg (Calculated) : 77.6  Vital Signs: Temp: 98.8 F (37.1 C) (08/12 1600) Temp Source: Oral (08/12 1600) BP: 139/76 (08/12 1900) Pulse Rate: 100 (08/12 1900)  Labs: Recent Labs    12/19/20 1731 12/20/20 0339 12/20/20 1313 12/20/20 1601 12/20/20 2119 12/21/20 0517 12/21/20 0613 12/21/20 1600 12/21/20 1830  HGB 9.3* 9.1*  --   --   --  8.8*  --   --   --   HCT 27.2* 26.6*  --   --   --  27.0*  --   --   --   PLT 170 173  --   --   --  211  --   --   --   APTT 41* 44*   < >  --  47*  --  39*  --  31  CREATININE  --  2.67*  --  2.47*  --  3.06*  --  4.18*  --   CKTOTAL  --  1,736*  --   --   --   --   --   --   --    < > = values in this interval not displayed.     Estimated Creatinine Clearance: 24.4 mL/min (A) (by C-G formula based on SCr of 4.18 mg/dL (H)).  Assessment: 60 yo male with RLE DVT s/p recent lumbar fusion, for heparin surgery. Patient has been on CRRT until yesterday with heparin running in the CRRT circuit at 750 units/hr. CRRT and circuit heparin were d/c'ed on 8/11.   aPTT this AM on 1800 units/hr of IV heparin was down to 39 likely due to stoppage of CRRT heparin. Patient had issues with bleeding near the wound incision site but this has stopped. Neurosurgery has assessed the patient and is okay with continuing heparin. Of note, heparin was stopped this AM for tunneled HD catheter placement.   Evening aptt low at 31 after rate increased to 2000 units/hr. No bleeding issues noted currently, will titrate rate.   Goal of Therapy:  aPTT 50-75 seconds.  Monitor platelets by anticoagulation protocol: Yes   Plan:  Increase heparin to 2200 units/hr. No bolus  Repeat aPTT in 8 hours Monitor closely for any s/s of bleeding near incision site.   Erin Hearing PharmD., BCPS Clinical  Pharmacist 12/21/2020 7:35 PM

## 2020-12-21 NOTE — Sedation Documentation (Signed)
Bedside report given to Merrilee Seashore, RN.

## 2020-12-21 NOTE — Progress Notes (Signed)
Patient ID: Keith Dixon, male   DOB: April 28, 1961, 60 y.o.   MRN: [id number] [facility] Progress Note   Assessment/ Plan:   1.  Acute kidney injury: Appears to be from a combination of ATN and rhabdomyolysis/pigment nephropathy.  He has dense renal injury.  He was started on CRRT on 8/7 for correction of multiple metabolic abnormalities and volume unloading.  Note that he also is charted as getting IV contrast on 8/7 - no acute indication for HD today - anticipate HD on 8/13 - Tunneled catheter today - appreciate IR  2.  Hyponatremia: Secondary to free water handling defect in the setting of acute kidney injury; Improved with CRRT  3.  Acute postoperative hypoxic/hypercapnic respiratory failure: Intubated and on ventilator support per CCM.  4.  Lumbar spine stenosis/scoliosis status post decompression and fusion surgery: Ongoing management/surveillance by neurosurgery.  5.  Shock - resolved Secondary to distributive/hypovolemic mechanism.  Status post intravenous fluids and pressors per primary.  Now off of pressors.  6. HTN controlled  7.  Acute blood loss anemia: Secondary to surgical associated losses, ongoing PRBC transfusion for continued downtrend of hemoglobin and hematocrit.  No ESA for now with acute CVA.  PRBC's on 8/9 and again on 8/10.  Gave additional unit on 8/10.  8.  Acute multifocal bilateral CVA: Suggestive of embolic disease, supportive management per primary team   Disposition - continue inpatient monitoring  Subjective:   He was anuric over 8/11.  He had 2.9 liters UF over 8/11 with CRRT.  He is about to go to IR for tunneled catheter.  CRRT clotted overnight just past midnight.  Per nursing team had him on heparin for a DVT. This is off for procedure.  Nursing reports that he has been following commands which is new.  He was on a vent wean mode prior to getting ready to go for procedure.   Review of systems:  Unable to obtain 2/2 mechanical  ventilation    Objective:   BP 124/84   Pulse (!) 105   Temp 99.3 F (37.4 C) (Oral)   Resp (!) 28   Ht 6' (1.829 m)   Wt 107.9 kg   SpO2 97%   BMI 32.26 kg/m   Intake/Output Summary (Last 24 hours) at 12/21/2020 4315 Last data filed at 12/21/2020 0600 Gross per 24 hour  Intake 2483.1 ml  Output 2716 ml  Net -232.9 ml   Weight change:   Physical Exam:   General adult male in bed intubated HEENT normocephalic tracks visually Neck trachea midline Lungs coarse mechanical breath sounds Heart S1S2 no rub Abdomen soft nontender with sedation nondistended Extremities 1+ edema lower extremities Neuro - continuous sedation running Psych no anxiety or agitation Access: left femoral nontunneled dialysis catheter   Imaging: DG Abd Portable 1V  Result Date: 12/20/2020 CLINICAL DATA:  Orogastric tube placement. EXAM: PORTABLE ABDOMEN - 1 VIEW COMPARISON:  December 14, 2020 FINDINGS: Orogastric with tip and side port overlying the stomach. The bowel gas pattern is normal. Fusion hardware extends from the lower lumbar spine into the sacrum. Cutaneous skin staples project over the midline abdomen. IMPRESSION: Orogastric tube tip and side port overlie the stomach. Electronically Signed   By: Dahlia Bailiff MD   On: 12/20/2020 16:39    Labs: BMET Recent Labs  Lab 12/18/20 0430 12/18/20 1600 12/19/20 0327 12/19/20 0518 12/19/20 1529 12/20/20 0339 12/20/20 1601 12/21/20 0517  NA 131* 133* 133* 135 134* 137 136 134*  K 4.3  4.4 4.8 4.6 4.7 4.4 4.9 4.6  CL 98 102  --  102 101 103 103 101  CO2 23 24  --  26 26 26 26 23   GLUCOSE 145* 127*  --  121* 116* 145* 131* 121*  BUN 33* 32*  --  31* 32* 33* 36* 48*  CREATININE 3.37* 3.13*  --  2.83* 2.65* 2.67* 2.47* 3.06*  CALCIUM 7.8* 7.8*  --  8.0* 8.1* 8.3* 8.5* 8.5*  PHOS 3.5 3.3  --  2.6 2.6 2.2* 2.6 2.5   CBC Recent Labs  Lab 12/19/20 0518 12/19/20 1731 12/20/20 0339 12/21/20 0517  WBC 7.1 8.8 9.5 13.4*  NEUTROABS 5.2  --   --    --   HGB 7.2* 9.3* 9.1* 8.8*  HCT 21.7* 27.2* 26.6* 27.0*  MCV 83.1 83.2 83.9 85.2  PLT 132* 170 173 211   Medications:     albuterol  2.5 mg Nebulization Q6H   alteplase  2 mg Intracatheter Once   amLODipine  10 mg Per Tube q AM   vitamin C  250 mg Per Tube Daily   bacitracin   Topical Daily   chlorhexidine gluconate (MEDLINE KIT)  15 mL Mouth Rinse BID   Chlorhexidine Gluconate Cloth  6 each Topical Daily   cholecalciferol  2,000 Units Per Tube BID   cyanocobalamin  1,000 mcg Intramuscular Daily   Followed by   Derrill Memo ON 12/23/2020] vitamin B-12  1,000 mcg Oral Daily   docusate  100 mg Per Tube BID   feeding supplement (PROSource TF)  90 mL Per Tube BID   folic acid  1 mg Intravenous Daily   gabapentin  300 mg Per Tube Q12H   guaiFENesin  10 mL Per Tube BID   lamoTRIgine  100 mg Per Tube BID   levETIRAcetam  1,000 mg Per Tube BID   mouth rinse  15 mL Mouth Rinse 10 times per day   pantoprazole sodium  40 mg Per Tube QHS   polyethylene glycol  17 g Per Tube Daily   QUEtiapine  200 mg Per Tube QHS   sertraline  100 mg Per Tube QPC breakfast   sodium chloride flush  3 mL Intravenous Q12H   [START ON 12/25/2020] thiamine injection  100 mg Intravenous Daily   zinc sulfate  220 mg Per Tube Daily   Claudia Desanctis, MD 12/21/2020, 9:08 AM

## 2020-12-21 NOTE — Procedures (Addendum)
Interventional Radiology Procedure Note  Date of Procedure: 12/21/2020  Procedure: Tunneled HD line placement   Findings:  1. Successful placement of a tunneled HD line using the right IJ.    Complications: No immediate complications noted.   Estimated Blood Loss: minimal  Follow-up and Recommendations: 1. Routine.  2. Ok to resume heparin drip 1 hour after procedure.    Albin Felling, MD  Vascular & Interventional Radiology  12/21/2020 10:03 AM

## 2020-12-21 NOTE — Progress Notes (Addendum)
NAME:  Keith Dixon, MRN:  [id number], DOB:  1960/12/09, LOS: 17 ADMISSION DATE:  12/12/2020, CONSULTATION DATE:  12/21/20  REFERRING MD:  Grandville Silos, CHIEF COMPLAINT:   respiratory depression  History of Present Illness:  Keith Dixon is a 60 y/o M with PMH significant for severe degenerative scoliosis with multi-level listhesis, anxiety, bipolar disorder, PTSD, seizures, OSA and tobacco use who was admitted to neurosurgery for L1-2, L2-3, L3-4, L4-5 DLIFs initially and subsequent T10-pelvis decompression and fusion.   His initial surgery was 8/3 and was slow to wake up, after second  surgery 8/4  (10hr procedure) pt was again slow to awaken post-anesthesia, so decision was made to leave patient intubated overnight.    Significant Hospital Events:  8/3 Admit to neurosurgery, underwent  anterior lateral Interbody fusion of L1-L5, notably had difficult foley insertion requiring urethral dilation 8/4 Stage 2 posterior decompression and fusion, left intubated post-procedure and PCCM consulted 8/5 AKI seen on am labs with evidence of volume depletion on POC [place] 8/6: Patient AKI was due to acute rhabdomyolysis in the setting of prolonged surgery, he remained oliguric.  Ultrasound lower extremity showed acute right lower extremity DVT and left IJ DVT, IVC filter placed 8/7: Patient serum creatinine continues to trend up, he remained oliguric, started on CRRT Head CT showed small multiple bilateral strokes.  EEG was placed  8/9>. No seizures per EEG, remains on Levo, Propofol and fentanyl with good vent synchrony. Remains on CVVH with UF goal of 50 cc per hour, but increasing pressor needs this am  8/11: off pressors, mental status precluding extubation 8/12: Tunneled HD line placement  Interim History / Subjective:  Did not tolerate sedation discontinuation yesterday due to agitation and coughing on the vent.  No significant overnight events. On 258mg/hr fentanyl.   Objective   Blood pressure  124/84, pulse (!) 105, temperature 99 F (37.2 C), temperature source Oral, resp. rate (!) 28, height 6' (1.829 m), weight 107.9 kg, SpO2 99 %.    Vent Mode: PRVC FiO2 (%):  [40 %] 40 % Set Rate:  [28 bmp] 28 bmp Vt Set:  [620 mL] 620 mL PEEP:  [5 cmH20] 5 cmH20 Pressure Support:  [5 cmH20] 5 cmH20 Plateau Pressure:  [15 cmH20-20 cmH20] 18 cmH20   Intake/Output Summary (Last 24 hours) at 12/21/2020 0708 Last data filed at 12/21/2020 0600 Gross per 24 hour  Intake 2605.19 ml  Output 2949 ml  Net -343.81 ml    Filed Weights   12/18/20 0900 12/19/20 0348 12/21/20 0400  Weight: 115.9 kg 115 kg 107.9 kg   Physical Exam  General: Critically ill-appearing middle-aged African-American male, lying in the bed HEENT: MMM, orally intubated Neuro: on 2062m fentanyl. Eyes open but does not look toward voice or track. Does not follow commands.  CV: Regular rate and rhythm.  +1 lower extremity edema extending presacral PULM: Intubated on full vent support. Copious pink tinged secretions with suction. Bilateral rhonchi improved since yesterday.  GI: Soft, not tender Skin: Deep pressure injury to the left heel. Well demarcated, indurated hematoma overlying the anterior chest at the nipple line. Not warm to touch. No discharge. Linear surgical incision overlying the vertebral column. No erythema or drainage. Small fluid collection lateral to the superior portion of the incision.   Assessment & Plan:   Acute post operative hypoxic and hypercapnic respiratory respiratory failure requiring mechanical ventilation  Vent day #10 Rare gram-negative's on 8/9 respiratory culture Did not tolerate the discontinuation of sedation yesterday. Plan -  Mental status continues to preclude extubation.  Will continue to work on weaning sedation as much as tolerated. -Continue daily SBTs and WUA -Follow respiratory culture -Discussed the potential need for a tracheostomy.  We will plan to monitor him throughout the  weekend to see if we can extubate and if not, would plan on a tracheostomy on Monday.  RRT dependent acute Oliguric renal failure secondary to rhabdomyolysis and ATN this admission. Electrolytes stable Plan - Nephrology following - Tunneled HD cath placement today - Transition to iHD as noted by nephrology - Plan to remove left femoral HD line once tunneled line can be confirmed to be functional  Transient fever overnight to 101.67F which is resolved this morning. Mild increase in white count. Suspect relation to VTE. Low suspicion for infection at this time--no growth on respiratory culture, no infectious changes around the surgical incision.  -will continue to monitor. If fever becomes persistent, or he develops other signs of systemic infection, would consider pursuing further workup including CXR, BC.   Acute right lower extremity DVT, acute left IJ DVT s/p IVC filter placement -continue heparin gtt  Acute on chronic anemia. S/p total of 6U PRBC transfused 8/4-8/10. Mild 0.3g drop since yesterday.  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 No embolic source identified--no thrombus on echocardiogram, TCD negative for PFO. Unfortunately, renal failure is precluding CTA for further evaluation for intracranial stenosis.  Mental status/ sedation has precluded complete neurologic evaluation  Plan -Neurology following -on heparin gtt for DVT  Hypertension - Continue amlodipine - As needed labetalol for SBP greater than 180 mgHg - D/C A-line - Remove Right IJ if able to obtain blood draws and administer meds through the tunneled HD line.   Hx of seizure disorder. No epileptic activity on EEG -continue home keppra and lamictal  Lumbar spinal stenosis s/p decompression and fusion -management per primary -Continue as needed oxycodone with as needed fentanyl pushes -Antibiotics  per primary  High risk malnutrition -continue TF -Core track ordered  Deep pressure injury of left heel. - DPI protocol  Chest hematoma. Noted yesterday afternoon and outlined. Mild expansion since yesterday.  -continue to monitor  Best Practice    Diet/type: NPO, TF DVT prophylaxis: heparin gtt GI prophylaxis: PPI Lines: Right IJ, Left femoral HD line, Right Radial A-line, Right peripheral IV, ETT, NG Foley: Removed 8/11 Code Status:  full code Last date of multidisciplinary goals of care discussion: 8/11 patient's family was updated at [place], MD Internal Medicine Resident PGY-3 [facility] Pager: [phone] 12/21/2020 7:08 AM

## 2020-12-21 NOTE — Progress Notes (Signed)
Neurosurgery  Pt seen and examined.  Eyes open, regards, PERRL. FC x 4 though severely deconditioned.  No drainage noted.  - will cont hep gtt with lower range PTT goal 50-75; may be able to liberalize next week

## 2020-12-21 NOTE — Procedures (Signed)
Cortrak  Tube Type:  Cortrak - 43 inches Tube Location:  Left nare Initial Placement:  Stomach Secured by: Bridle Technique Used to Measure Tube Placement:  Marking at nare/corner of mouth Cortrak Secured At:  70 cm  Cortrak Tube Team Note:  Consult received to place a Cortrak feeding tube.   X-ray is required, abdominal x-ray has been ordered by the Cortrak team. Please confirm tube placement before using the Cortrak tube.   If the tube becomes dislodged please keep the tube and contact the Cortrak team at [email or web address] ([email or web address]) for replacement.  If after hours and replacement cannot be delayed, place a NG tube and confirm placement with an abdominal x-ray.    Nephtali Docken MS, RD, LDN Please refer to AMION for RD and/or RD on-call/weekend/after hours pager   

## 2020-12-21 NOTE — Progress Notes (Signed)
Nutrition Follow-up  DOCUMENTATION CODES:   Not applicable  INTERVENTION:   Tube feeding via Cortrak tube: Pivot 1.5 at 65 ml/h (1560 ml per day)  Provides 2340 kcal, 146 gm protein, 1184 ml free water daily   NUTRITION DIAGNOSIS:   Increased nutrient needs related to wound healing as evidenced by estimated needs. Ongoing.   GOAL:   Patient will meet greater than or equal to 90% of their needs Met with TF.   MONITOR:   TF tolerance  REASON FOR ASSESSMENT:   Consult Enteral/tube feeding initiation and management  ASSESSMENT:   Pt with PMH of anxiety, PTSD, bipolar 1 disorder, depression, HTN, Sz, sleep apnea, and a smoker who was admitted for severe scoliosis with multi-level listhesis.    Pt discussed during ICU rounds and with RN. Pt developed AKI and rhabdomyolysis after complex two-stage spinal surgery requiring CRRT. Pt had tunneled HD cath placed today. Mental status prohibits extubation at this time. Pt may require trach on Monday.   8/3 s/p L1-2, L2-3, L3-4, L4-5, DLIFs 8/4 T10-pelvis (10 procedure) 8/7 started on CRRT 8/12 CRRT stopped, tunneled HD catheter placed, cortrak placed with tip gastric  Patient is currently intubated on ventilator support MV: 14.6 L/min Temp (24hrs), Avg:99.3 F (37.4 C), Min:98.3 F (36.8 C), Max:101.2 F (38.4 C)   Medications reviewed and include: vitamin C, vitamin D3, vitamin E02, colace, folic acid, protonix, miralax, thiamine, zinc sulfate (8/11)  Fentanyl  Labs reviewed: Na 134 CBG's: 109-134  Diet Order:   Diet Order             Diet NPO time specified  Diet effective midnight                   EDUCATION NEEDS:   Not appropriate for education at this time  Skin:  Skin Assessment: Skin Integrity Issues: Skin Integrity Issues:: DTI DTI: L heel  Last BM:  8/11 x 2  Height:   Ht Readings from Last 1 Encounters:  12/13/20 6' (1.829 m)    Weight:   Wt Readings from Last 1 Encounters:   12/21/20 107.9 kg    BMI:  Body mass index is 32.26 kg/m.  Estimated Nutritional Needs:   Kcal:  2200-2500  Protein:  130-150 grams  Fluid:  >2 L/day  Lockie Pares., RD, LDN, CNSC See AMiON for contact information

## 2020-12-21 NOTE — Progress Notes (Signed)
STROKE TEAM PROGRESS NOTE   INTERVAL HISTORY RN is at the bedside. Pt just received '2mg'$  of versed, not very responsive to me. However, per RN, pt was able to stick out of tongue today as requested, and following commands to wiggle both feet earlier today. He had HD catheter today for HD, off CRRT. Off pressors, off propofol, still on fentanyl. Discussed with Dr. Marcello Moores, will do MRI brain and MRA head and neck.   Vitals:   12/21/20 1017 12/21/20 1200 12/21/20 1313 12/21/20 1316  BP:      Pulse: (!) 102     Resp: (!) 28     Temp:  98.3 F (36.8 C)    TempSrc:  Oral    SpO2: 100%  98% 100%  Weight:      Height:       CBC:  Recent Labs  Lab 12/19/20 0518 12/19/20 1731 12/20/20 0339 12/21/20 0517  WBC 7.1   < > 9.5 13.4*  NEUTROABS 5.2  --   --   --   HGB 7.2*   < > 9.1* 8.8*  HCT 21.7*   < > 26.6* 27.0*  MCV 83.1   < > 83.9 85.2  PLT 132*   < > 173 211   < > = values in this interval not displayed.   Basic Metabolic Panel:  Recent Labs  Lab 12/20/20 0339 12/20/20 1601 12/21/20 0517  NA 137 136 134*  K 4.4 4.9 4.6  CL 103 103 101  CO2 '26 26 23  '$ GLUCOSE 145* 131* 121*  BUN 33* 36* 48*  CREATININE 2.67* 2.47* 3.06*  CALCIUM 8.3* 8.5* 8.5*  MG 2.6*  --  2.6*  PHOS 2.2* 2.6 2.5   Lipid Panel:  Recent Labs  Lab 12/16/20 1623 12/17/20 0500 12/20/20 0339  CHOL 89  --   --   TRIG 235*   < > 213*  HDL 12*  --   --   CHOLHDL 7.4  --   --   VLDL 47*  --   --   LDLCALC 30  --   --    < > = values in this interval not displayed.   HgbA1c:  Recent Labs  Lab 12/17/20 0500  HGBA1C 5.8*    IMAGING past 24 hours IR Fluoro Guide CV Line Right  Result Date: 12/21/2020 INDICATION: Need for long-term dialysis EXAM: TUNNELED CENTRAL VENOUS HEMODIALYSIS CATHETER PLACEMENT WITH ULTRASOUND AND FLUOROSCOPIC GUIDANCE MEDICATIONS: Cefazolin 2 g. The antibiotic was given in an appropriate time interval prior to skin puncture. ANESTHESIA/SEDATION: Moderate (conscious) sedation  was employed during this procedure. A total of Versed 1 mg and Fentanyl 0 mcg was administered intravenously. Moderate Sedation Time: 21 minutes. The patient's level of consciousness and vital signs were monitored continuously by radiology nursing throughout the procedure under my direct supervision. FLUOROSCOPY TIME:  Fluoroscopy Time: 0 minutes 12 seconds (4 mGy). COMPLICATIONS: None immediate. PROCEDURE: Informed written consent was obtained from patient's family after a discussion of the risks, benefits, and alternatives to treatment. Questions regarding the procedure were encouraged and answered. The patient was placed supine on the exam table. The right neck and chest was prepped and draped in the standard sterile fashion. Ultrasound was used to evaluate the right internal jugular vein, which found to be patent and suitable for access. An ultrasound image was saved. Using ultrasound guidance, the right internal jugular vein was rec punctured using a 21 gauge micropuncture set. An 018 wire was advanced into the SVC,  followed by serial tract dilation and placement of an 035 wire which was advanced into the IVC. Attention was then turned to an infraclavicular location, or a small dermatotomy was made after the administration of additional local analgesia. From this location, a 19 cm tip to cuff cuffed hemodialysis catheter was tunneled to the venotomy site. A peel-away sheath was then advanced over the access wire. Through this peel-away sheath, the hemodialysis catheter was advanced into the central veins, assess the tip was positioned near the superior cavoatrial junction. The line was found to flush and aspirate appropriately. It was locked with the appropriate volume of heparinized saline. It was sutured to the skin using 0 silk suture, and the venotomy was closed with Dermabond. A sterile dressing was placed. The patient tolerated the procedure well without immediate complication. IMPRESSION: Successful  placement of 19 cm tip to cuff tunneled hemodialysis catheter via the right internal jugular vein with tips terminating within the superior aspect of the right atrium. The catheter is ready for immediate use. Electronically Signed   By: Albin Felling MD   On: 12/21/2020 10:39   IR US Guide Vasc Access Right  Result Date: 12/21/2020 INDICATION: Need for long-term dialysis EXAM: TUNNELED CENTRAL VENOUS HEMODIALYSIS CATHETER PLACEMENT WITH ULTRASOUND AND FLUOROSCOPIC GUIDANCE MEDICATIONS: Cefazolin 2 g. The antibiotic was given in an appropriate time interval prior to skin puncture. ANESTHESIA/SEDATION: Moderate (conscious) sedation was employed during this procedure. A total of Versed 1 mg and Fentanyl 0 mcg was administered intravenously. Moderate Sedation Time: 21 minutes. The patient's level of consciousness and vital signs were monitored continuously by radiology nursing throughout the procedure under my direct supervision. FLUOROSCOPY TIME:  Fluoroscopy Time: 0 minutes 12 seconds (4 mGy). COMPLICATIONS: None immediate. PROCEDURE: Informed written consent was obtained from patient's family after a discussion of the risks, benefits, and alternatives to treatment. Questions regarding the procedure were encouraged and answered. The patient was placed supine on the exam table. The right neck and chest was prepped and draped in the standard sterile fashion. Ultrasound was used to evaluate the right internal jugular vein, which found to be patent and suitable for access. An ultrasound image was saved. Using ultrasound guidance, the right internal jugular vein was rec punctured using a 21 gauge micropuncture set. An 018 wire was advanced into the SVC, followed by serial tract dilation and placement of an 035 wire which was advanced into the IVC. Attention was then turned to an infraclavicular location, or a small dermatotomy was made after the administration of additional local analgesia. From this location, a 19 cm  tip to cuff cuffed hemodialysis catheter was tunneled to the venotomy site. A peel-away sheath was then advanced over the access wire. Through this peel-away sheath, the hemodialysis catheter was advanced into the central veins, assess the tip was positioned near the superior cavoatrial junction. The line was found to flush and aspirate appropriately. It was locked with the appropriate volume of heparinized saline. It was sutured to the skin using 0 silk suture, and the venotomy was closed with Dermabond. A sterile dressing was placed. The patient tolerated the procedure well without immediate complication. IMPRESSION: Successful placement of 19 cm tip to cuff tunneled hemodialysis catheter via the right internal jugular vein with tips terminating within the superior aspect of the right atrium. The catheter is ready for immediate use. Electronically Signed   By: Albin Felling MD   On: 12/21/2020 10:39   DG Abd Portable 1V  Result Date: 12/21/2020 CLINICAL DATA:  Orogastric tube placement. EXAM: PORTABLE ABDOMEN - 1 VIEW COMPARISON:  December 20, 2020. FINDINGS: The bowel gas pattern is normal. Distal tip of enteric tube is seen in expected position of distal stomach. No radio-opaque calculi or other significant radiographic abnormality are seen. IMPRESSION: Distal tip of enteric tube seen in expected position of distal stomach. Electronically Signed   By: Marijo Conception M.D.   On: 12/21/2020 13:56   DG Abd Portable 1V  Result Date: 12/20/2020 CLINICAL DATA:  Orogastric tube placement. EXAM: PORTABLE ABDOMEN - 1 VIEW COMPARISON:  December 14, 2020 FINDINGS: Orogastric with tip and side port overlying the stomach. The bowel gas pattern is normal. Fusion hardware extends from the lower lumbar spine into the sacrum. Cutaneous skin staples project over the midline abdomen. IMPRESSION: Orogastric tube tip and side port overlie the stomach. Electronically Signed   By: Dahlia Bailiff MD   On: 12/20/2020 16:39      Physical Exam   Temp:  [98.3 F (36.8 C)-101.2 F (38.4 C)] 98.3 F (36.8 C) (08/12 1200) Pulse Rate:  [83-107] 102 (08/12 1017) Resp:  [12-29] 28 (08/12 1017) BP: (107-147)/(50-84) 137/74 (08/12 1000) SpO2:  [93 %-100 %] 100 % (08/12 1316) Arterial Line BP: (114-207)/(20-69) 128/67 (08/12 0600) FiO2 (%):  [40 %] 40 % (08/12 1316) Weight:  [107.9 kg] 107.9 kg (08/12 0400)  General - Well nourished, well developed, intubated on fentanyl, and just received '2mg'$  of versed.  Ophthalmologic - fundi not visualized due to noncooperation.  Cardiovascular - Regular rate and rhythm.  Neuro - Intubated on fentanyl just s/p versed '2mg'$ , eyes halfway open spontaneously but not following commands. Eyes midline, not blinking to visual threat, doll's eyes absent, not tracking, bilateral pupil 2 mm sluggish to light. Corneal reflex weakly present, gag and cough present. Breathing over the vent.  Facial symmetry not able to test due to ET tube.  Tongue protrusion not cooperative. On pain stimulation, no movement in all extremities. DTR diminished and no babinski. Sensation, coordination and gait not tested.     Assessment and Plan  ORLAND CARIKER is a 60 y.o. male with PMH significant for PTSD, OSA, HTN, tobacco use, ?seizures, bipolar, degenerativse scoliosis who underwent extensive T10-pelvis decompression and fusion over 2 days(8/4 and 8/5) and difficulty waking up after surgery. Also complicated by Aki, rhabdomyolysis, DVT in RLE and left IJ s/p IVC filter and poor mentation/encephalopathy. Workup with CTH demonstrated embolic appearing strokes and neurology consulted.  #Stroke: bilateral cortical MCA/ACA watershed area and left thalamic infarcts, concerning for perioperative hypotension in the setting of intracranial stenosis. Can not rule out cardioembolic source CT head Small recent appearing bilateral cortical infarcts, left thalamic and probable bilateral cerebellar infarcts.  MRI  unable  d/t hardware and CRRT with multiple lines of meds Carotid Doppler w/LICA [id number] % stenosis, RICA not able to sonicate due to central line CTA Head on 09/11/20 with no significant intracranial stenosis MRI brain pending MRA head and neck pending 2D Echo w/EF 65 to 70 %, LA normal in size  BLE doppler showed right LE DVT TCD with Bubble was negative for PFO  TCD  mild elevated velocity of left MCA, right MCA and right ICA siphon, mild  stenosis in these vessels. LDL 30 HgbA1c 5.8 VTE prophylaxis - heparin IV No antithrombotic prior to admission, now on heparin IV given DVT. Continue heparin IV now given possible trach soon Therapy recommendations:  pending  #Seizure History  LTM this admission did not show any  seizures or epileptiform discharges.  LTM d/c 8/9 Continue with Lamictal 100 mg BID and Keppra 1000 mg BID.   DVT LE venous doppler showed right LE DVT Also left IJ thrombosis found during line placement S/p IVC filter Now on heparin IV  AKI with renal failure Cre 6.74->4.91->4.12->3.37->2.83->2.67->3.06 Was on CRRT -> off now Placed HD catheter  Likely HD tomorrow Nephrology on board  Severe anemia Hb 7.5->6.1-> PRBC->7.2->9.3->9.1->8.8 Continue CBC monitoring Continue heparin IV for now  Extensive back surgery Dr. Marcello Moores on board Status post surgery 8/4 and 8/5 Drainage removed 8/9  Respiratory failure Intubated on vent Off propofol Still on fentanyl CCM on board May need tracheostomy - continue heparin IV for now  #Hypertension Home meds:  norvasc Stable Long-term BP goal normotensive  Hyperlipidemia Home meds:  none  LDL 30, goal < 70 No statin needed at the time given LDL at goal  Tobacco abuse Current smoker Smoking cessation counseling will be provided  Other Stroke Risk Factors ETOH use, alcohol level <10, advised to drink no more than 2 drink(s) a day Substance abuse - UDS:  reported THC use Obesity, Body mass index is 32.26 kg/m., BMI >/=  30 associated with increased stroke risk, recommend weight loss, diet and exercise as appropriate   Other Active Problems B12 deficiency, supplement Hypocalcemia   Hospital day # 9  This patient is critically ill due to kidney failure, respiratory failure, severe anemia, DVT, embolic stroke and at significant risk of neurological worsening, death form recurrent stroke, seizure, heart failure, renal failure, PE, sepsis, severe anemia. This patient's care requires constant monitoring of vital signs, hemodynamics, respiratory and cardiac monitoring, review of multiple databases, neurological assessment, discussion with family, other specialists and medical decision making of high complexity. I spent 30 minutes of neurocritical care time in the care of this patient.  I discussed with Dr. Marcello Moores.   Rosalin Hawking, MD PhD Stroke Neurology 12/21/2020 3:45 PM   To contact Stroke Continuity provider, please refer to [email or web address] After hours, contact General Neurology

## 2020-12-22 ENCOUNTER — Inpatient Hospital Stay (HOSPITAL_COMMUNITY): Payer: No Typology Code available for payment source

## 2020-12-22 DIAGNOSIS — J9601 Acute respiratory failure with hypoxia: Secondary | ICD-10-CM | POA: Diagnosis not present

## 2020-12-22 DIAGNOSIS — R4182 Altered mental status, unspecified: Secondary | ICD-10-CM

## 2020-12-22 DIAGNOSIS — J9602 Acute respiratory failure with hypercapnia: Secondary | ICD-10-CM | POA: Diagnosis not present

## 2020-12-22 DIAGNOSIS — R410 Disorientation, unspecified: Secondary | ICD-10-CM

## 2020-12-22 LAB — RENAL FUNCTION PANEL
Albumin: 1.8 g/dL — ABNORMAL LOW (ref 3.5–5.0)
Albumin: 1.7 g/dL — ABNORMAL LOW (ref 3.5–5.0)
Anion gap: 12 (ref 5–15)
Anion gap: 10 (ref 5–15)
BUN: 81 mg/dL — ABNORMAL HIGH (ref 6–20)
BUN: 94 mg/dL — ABNORMAL HIGH (ref 6–20)
CO2: 24 mmol/L (ref 22–32)
CO2: 23 mmol/L (ref 22–32)
Calcium: 8.9 mg/dL (ref 8.9–10.3)
Calcium: 8.7 mg/dL — ABNORMAL LOW (ref 8.9–10.3)
Chloride: 101 mmol/L (ref 98–111)
Chloride: 103 mmol/L (ref 98–111)
Creatinine, Ser: 4.95 mg/dL — ABNORMAL HIGH (ref 0.61–1.24)
Creatinine, Ser: 5.9 mg/dL — ABNORMAL HIGH (ref 0.61–1.24)
GFR, Estimated: 13 mL/min — ABNORMAL LOW (ref >60)
GFR, Estimated: 10 mL/min — ABNORMAL LOW (ref >60)
Glucose, Bld: 135 mg/dL — ABNORMAL HIGH (ref 70–99)
Glucose, Bld: 133 mg/dL — ABNORMAL HIGH (ref 70–99)
Phosphorus: 3.2 mg/dL (ref 2.5–4.6)
Phosphorus: 3.6 mg/dL (ref 2.5–4.6)
Potassium: 4.9 mmol/L (ref 3.5–5.1)
Potassium: 5.1 mmol/L (ref 3.5–5.1)
Sodium: 137 mmol/L (ref 135–145)
Sodium: 136 mmol/L (ref 135–145)

## 2020-12-22 LAB — CBC
HCT: 24.8 % — ABNORMAL LOW (ref 39.0–52.0)
Hemoglobin: 8 g/dL — ABNORMAL LOW (ref 13.0–17.0)
MCH: 28 pg (ref 26.0–34.0)
MCHC: 32.3 g/dL (ref 30.0–36.0)
MCV: 86.7 fL (ref 80.0–100.0)
Platelets: 243 10*3/uL (ref 150–400)
RBC: 2.86 MIL/uL — ABNORMAL LOW (ref 4.22–5.81)
RDW: 17.2 % — ABNORMAL HIGH (ref 11.5–15.5)
WBC: 12.7 10*3/uL — ABNORMAL HIGH (ref 4.0–10.5)
nRBC: 0 % (ref 0.0–0.2)

## 2020-12-22 LAB — GLUCOSE, CAPILLARY
Glucose-Capillary: 117 mg/dL — ABNORMAL HIGH (ref 70–99)
Glucose-Capillary: 118 mg/dL — ABNORMAL HIGH (ref 70–99)
Glucose-Capillary: 119 mg/dL — ABNORMAL HIGH (ref 70–99)
Glucose-Capillary: 133 mg/dL — ABNORMAL HIGH (ref 70–99)
Glucose-Capillary: 134 mg/dL — ABNORMAL HIGH (ref 70–99)
Glucose-Capillary: 139 mg/dL — ABNORMAL HIGH (ref 70–99)

## 2020-12-22 LAB — APTT
aPTT: 34 seconds (ref 24–36)
aPTT: 41 seconds — ABNORMAL HIGH (ref 24–36)

## 2020-12-22 LAB — MAGNESIUM: Magnesium: 2.8 mg/dL — ABNORMAL HIGH (ref 1.7–2.4)

## 2020-12-22 MED ORDER — ACETAMINOPHEN 160 MG/5ML PO SOLN
650.0000 mg | ORAL | Status: DC | PRN
  Administered 2020-12-29 – 2021-01-07 (×11): 650 mg
  Filled 2020-12-22 (×11): qty 20.3

## 2020-12-22 MED ORDER — GABAPENTIN 250 MG/5ML PO SOLN
300.0000 mg | Freq: Every day | ORAL | Status: DC
  Administered 2020-12-23 – 2021-01-08 (×17): 300 mg
  Filled 2020-12-22 (×17): qty 6

## 2020-12-22 MED ORDER — DEXMEDETOMIDINE HCL IN NACL 400 MCG/100ML IV SOLN
0.4000 ug/kg/h | INTRAVENOUS | Status: DC
  Administered 2020-12-22 (×2): 0.4 ug/kg/h via INTRAVENOUS
  Administered 2020-12-23 (×2): 0.5 ug/kg/h via INTRAVENOUS
  Administered 2020-12-23 (×2): 0.7 ug/kg/h via INTRAVENOUS
  Administered 2020-12-24: 1 ug/kg/h via INTRAVENOUS
  Administered 2020-12-24: 0.9 ug/kg/h via INTRAVENOUS
  Administered 2020-12-24: 1 ug/kg/h via INTRAVENOUS
  Administered 2020-12-24: 1.1 ug/kg/h via INTRAVENOUS
  Administered 2020-12-24 (×2): 1 ug/kg/h via INTRAVENOUS
  Administered 2020-12-25 (×2): 0.7 ug/kg/h via INTRAVENOUS
  Filled 2020-12-22 (×2): qty 100
  Filled 2020-12-22: qty 200
  Filled 2020-12-22 (×8): qty 100
  Filled 2020-12-22: qty 200
  Filled 2020-12-22: qty 100

## 2020-12-22 MED ORDER — ACETAMINOPHEN 160 MG/5ML PO SOLN: 650.0000 mg | ORAL | Status: DC | PRN

## 2020-12-22 NOTE — Progress Notes (Signed)
[place] for heparin Indication: DVT  No Known Allergies  Patient Measurements: Height: 6' (182.9 cm) Weight: 107.9 kg (237 lb 14 oz) IBW/kg (Calculated) : 77.6  Vital Signs: Temp: 99.1 F (37.3 C) (08/13 0000) Temp Source: Axillary (08/13 0000) BP: 144/78 (08/13 0600) Pulse Rate: 104 (08/13 0600)  Labs: Recent Labs    12/20/20 0339 12/20/20 1313 12/20/20 1601 12/20/20 2119 12/21/20 0517 12/21/20 0613 12/21/20 1600 12/21/20 1830 12/22/20 0521  HGB 9.1*  --   --   --  8.8*  --   --   --  8.0*  HCT 26.6*  --   --   --  27.0*  --   --   --  24.8*  PLT 173  --   --   --  211  --   --   --  243  APTT 44*   < >  --    < >  --  39*  --  31 34  CREATININE 2.67*  --  2.47*  --  3.06*  --  4.18*  --   --   CKTOTAL 1,736*  --   --   --   --   --   --   --   --    < > = values in this interval not displayed.     Estimated Creatinine Clearance: 24.4 mL/min (A) (by C-G formula based on SCr of 4.18 mg/dL (H)).  Assessment: 60 yo male with RLE DVT s/p recent lumbar fusion, for heparin surgery. Patient has been on CRRT until yesterday with heparin running in the CRRT circuit at 750 units/hr. CRRT and circuit heparin were d/c'ed on 8/11.   aPTT this AM on 1800 units/hr of IV heparin was down to 39 likely due to stoppage of CRRT heparin. Patient had issues with bleeding near the wound incision site but this has stopped. Neurosurgery has assessed the patient and is okay with continuing heparin. Of note, heparin was stopped this AM for tunneled HD catheter placement.   Evening aptt low at 31 after rate increased to 2000 units/hr. No bleeding issues noted currently, will titrate rate.   8/13 AM update:  aPTT remains low  Goal of Therapy:  aPTT 50-75 seconds.  Monitor platelets by anticoagulation protocol: Yes   Plan:  No boluses Inc heparin to 2400 units/hr 1400 aPTT Monitor closely for any s/s of bleeding near incision site.   Narda Bonds, PharmD, BCPS Clinical Pharmacist Phone: [phone]

## 2020-12-22 NOTE — Progress Notes (Addendum)
[place] for heparin Indication: DVT  No Known Allergies  Patient Measurements: Height: 6' (182.9 cm) Weight: 107.9 kg (237 lb 14 oz) IBW/kg (Calculated) : 77.6 HEPARIN DW (KG): 100.6   Vital Signs: Temp: 99.2 F (37.3 C) (08/13 1200) Temp Source: Axillary (08/13 1200) BP: 110/56 (08/13 1100) Pulse Rate: 83 (08/13 1100)  Labs: Recent Labs    12/20/20 0339 12/20/20 1313 12/21/20 0517 12/21/20 0613 12/21/20 1600 12/21/20 1830 12/22/20 0521 12/22/20 1427  HGB 9.1*  --  8.8*  --   --   --  8.0*  --   HCT 26.6*  --  27.0*  --   --   --  24.8*  --   PLT 173  --  211  --   --   --  243  --   APTT 44*   < >  --    < >  --  31 34 41*  CREATININE 2.67*   < > 3.06*  --  4.18*  --  4.95*  --   CKTOTAL 1,736*  --   --   --   --   --   --   --    < > = values in this interval not displayed.     Estimated Creatinine Clearance: 20.6 mL/min (A) (by C-G formula based on SCr of 4.95 mg/dL (H)).  Assessment: 60 yo male with RLE DVT s/p recent lumbar fusion on heparin. He is noted w/ recent bleeding from back wound incision site now stopped and with chest hematoma. He is also noted with CVA, ESRD s/p tunneled HD line on 8/12 -aPTT= 41 on 2400 units/hr -hg= 8    Goal of Therapy:  aPTT 50-75 seconds.  Monitor platelets by anticoagulation protocol: Yes   Plan:  No boluses Increase heparin to 2600 units/hr aPTT in 8 hrs  Hildred Laser, PharmD Clinical Pharmacist **Pharmacist phone directory can now be found on [email or web address] ([email or web address]).  Listed under [place].

## 2020-12-22 NOTE — Progress Notes (Signed)
NAME:  Keith Dixon, MRN:  [id number], DOB:  [birth year], LOS: 60 ADMISSION DATE:  12/12/2020, CONSULTATION DATE:  12/22/20  REFERRING MD:  [place]ville Silos, CHIEF COMPLAINT:   respiratory depression  History of Present Illness:  Keith Dixon is a 60 y/o M with PMH significant for severe degenerative scoliosis with multi-level listhesis, anxiety, bipolar disorder, PTSD, seizures, OSA and tobacco use who was admitted to neurosurgery for L1-2, L2-3, L3-4, L4-5 DLIFs initially and subsequent T10-pelvis decompression and fusion.   His initial surgery was 8/3 and was slow to wake up, after second  surgery 8/4  (10hr procedure) pt was again slow to awaken post-anesthesia, so decision was made to leave patient intubated overnight.    Significant Hospital Events:  8/3 Admit to neurosurgery, underwent  anterior lateral Interbody fusion of L1-L5, notably had difficult foley insertion requiring urethral dilation 8/4 Stage 2 posterior decompression and fusion, left intubated post-procedure and PCCM consulted 8/5 AKI seen on am labs with evidence of volume depletion on POC [place] 8/6: Patient AKI was due to acute rhabdomyolysis in the setting of prolonged surgery, he remained oliguric.  Ultrasound lower extremity showed acute right lower extremity DVT and left IJ DVT, IVC filter placed 8/7: Patient serum creatinine continues to trend up, he remained oliguric, started on CRRT Head CT showed small multiple bilateral strokes.  EEG was placed  8/9>. No seizures per EEG, remains on Levo, Propofol and fentanyl with good vent synchrony. Remains on CVVH with UF goal of 50 cc per hour, but increasing pressor needs this am  8/11: off pressors, mental status precluding extubation 8/12: Tunneled HD line placement  Interim History / Subjective:  Back on fentanyl infusion, now on 200 Tolerated PSV for much of the day 8/12, currently on PSV 10 but with significant tachypnea No urine output reported, Foley catheter is  out  Objective   Blood pressure (!) 144/78, pulse (!) 104, temperature 99.1 F (37.3 C), temperature source Axillary, resp. rate (!) 25, height 6' (1.829 m), weight 107.9 kg, SpO2 97 %.    Vent Mode: PSV;CPAP FiO2 (%):  [40 %] 40 % Set Rate:  [28 bmp] 28 bmp Vt Set:  [620 mL] 620 mL PEEP:  [5 cmH20] 5 cmH20 Pressure Support:  [10 [id number] cmH20] 10 cmH20 Plateau Pressure:  [18 cmH20-20 cmH20] 19 cmH20   Intake/Output Summary (Last 24 hours) at 12/22/2020 0826 Last data filed at 12/22/2020 0700 Gross per 24 hour  Intake 2939.94 ml  Output --  Net 2939.94 ml   Filed Weights   12/18/20 0900 12/19/20 0348 12/21/20 0400  Weight: 115.9 kg 115 kg 107.9 kg   Physical Exam  General: Ill, obese, intubated HEENT: ET tube in good position, pupils equal Neuro: Opens eyes, tracks, follows commands upper extremities and bilateral lower extremities CV: Tachycardic, regular, 1+ lower extremity edema PULM: Coarse bilateral breath sounds, tachypneic, no wheezing.  Pink secretions in suction tubing GI: Obese, nondistended, positive bowel sounds Skin: Pressure dressings on bilateral heels, hematoma on anterior chest unchanged  Assessment & Plan:   Acute post operative hypoxic and hypercapnic respiratory respiratory failure requiring mechanical ventilation  Rare gram-negative's on 8/9 respiratory culture > normal flora Tachypneic on SBT 8/13, question component of work of breathing, agitation Plan -Mental status improving, adjusting sedating medications to hopefully facilitate SBT -Continue SBT, PSV as he can tolerate -VAP prevention orders -Depending on progress may need to discuss more prolonged relator course, tracheostomy.  Hopefully we will be able to progress, avoid  Acute  encephalopathy, agitation with mechanical ventilation -Continue moderate dose fentanyl infusion -Add Precedex on 8/13 -Seroquel added 8/12, consider uptitrate  RRT dependent acute Oliguric renal failure secondary to  rhabdomyolysis and ATN this admission. Electrolytes stable Plan -Appreciate nephrology management -Intermittent HD as per their plans, tunneled HD catheter in place on 8/12  Transient fever -none over last 24 hours -Continue to follow WBC, clinical status.  Repeat cultures if fever recurs  Acute right lower extremity DVT, acute left IJ DVT s/p IVC filter placement -Heparin infusion, targeting lower end of therapeutic range  Acute on chronic anemia. S/p total of 6U PRBC transfused 8/4-8/10. -Following CBC  Acute bilateral cortical and subcortical MCA/ACA watershed territory infarcts Ddx post operative hypotension vs embolic source. No embolic source identified--no thrombus on echocardiogram, TCD negative for PFO. Unfortunately, renal failure is precluding CTA for further evaluation for intracranial stenosis.  Mental status/ sedation has precluded complete neurologic evaluation  Plan -Appreciate neurology assistance -heparin infusion as ordered  Hypertension -Continue amlodipine  Hx of seizure disorder. No epileptic activity on EEG -On home Keppra and Lamictal  Lumbar spinal stenosis s/p decompression and fusion -management per primary -Pain control -Antibiotics completed  High risk malnutrition -Tube feeding  Deep pressure injury of left heel. -Wound care  Chest hematoma. Noted yesterday afternoon and outlined.  -Following  Best Practice    Diet/type: NPO, TF DVT prophylaxis: heparin gtt GI prophylaxis: PPI Lines: Right IJ, Left femoral HD line, Right Radial A-line, Right peripheral IV, ETT, NG Foley: Removed 8/11 Code Status:  full code Last date of multidisciplinary goals of care discussion: 8/11 patient's family was updated at bedside  Independent critical care time 34 minutes  Baltazar Apo, MD, PhD 12/22/2020, 8:41 AM [facility] [phone] or if no answer before 7:00PM call [phone] For any issues after 7:00PM please call  eLink [phone]

## 2020-12-22 NOTE — Progress Notes (Signed)
STROKE TEAM PROGRESS NOTE   INTERVAL HISTORY RN is at the bedside. He is not able to do much. Will open/close eyes to command but not consistently. Able to do more per family/nurse when he is more cooperative. Vitals:   12/22/20 1300 12/22/20 1400 12/22/20 1416 12/22/20 1500  BP: (!) 107/56 (!) 112/57    Pulse: 79 89  78  Resp: (!) 23 (!) 25  20  Temp:      TempSrc:      SpO2: 100% 99% 99% 100%  Weight:      Height:       CBC:  Recent Labs  Lab 12/19/20 0518 12/19/20 1731 12/21/20 0517 12/22/20 0521  WBC 7.1   < > 13.4* 12.7*  NEUTROABS 5.2  --   --   --   HGB 7.2*   < > 8.8* 8.0*  HCT 21.7*   < > 27.0* 24.8*  MCV 83.1   < > 85.2 86.7  PLT 132*   < > 211 243   < > = values in this interval not displayed.   Basic Metabolic Panel:  Recent Labs  Lab 12/21/20 0517 12/21/20 1600 12/22/20 0521  NA 134* 135 137  K 4.6 5.1 4.9  CL 101 102 101  CO2 '23 25 24  '$ GLUCOSE 121* 107* 135*  BUN 48* 63* 81*  CREATININE 3.06* 4.18* 4.95*  CALCIUM 8.5* 8.6* 8.9  MG 2.6*  --  2.8*  PHOS 2.5 3.7 3.2   Lipid Panel:  Recent Labs  Lab 12/16/20 1623 12/17/20 0500 12/20/20 0339  CHOL 89  --   --   TRIG 235*   < > 213*  HDL 12*  --   --   CHOLHDL 7.4  --   --   VLDL 47*  --   --   LDLCALC 30  --   --    < > = values in this interval not displayed.   HgbA1c:  Recent Labs  Lab 12/17/20 0500  HGBA1C 5.8*    IMAGING past 24 hours No results found.   Physical Exam   Temp:  [99.1 F (37.3 C)-100.6 F (38.1 C)] 99.2 F (37.3 C) (08/13 1200) Pulse Rate:  [78-115] 78 (08/13 1500) Resp:  [19-30] 20 (08/13 1500) BP: (90-156)/(49-82) 112/57 (08/13 1400) SpO2:  [94 %-100 %] 100 % (08/13 1500) FiO2 (%):  [40 %] 40 % (08/13 1416)  General - Well nourished, well developed,   Ophthalmologic - fundi not visualized due to noncooperation.  Cardiovascular - Regular rate and rhythm.  Neuro - Intubated. On precedex for pain. Eyes midline, not blinking to visual threat, +doll's  eyes.  not tracking, bilateral pupil 2 mm sluggish to light. Corneal reflex weakly present +gag and cough present.  Facial symmetry not able to test due to ET tube.  Tongue protrusion not cooperative/intubated. No withdrawal to pain in all 4 ext. He did have spontaneous movements of his toes/feet and right arm.  DTR diminished and no babinski. Sensation, coordination and gait not tested.     Assessment and Plan  Keith Dixon is a 60 y.o. male with PMH significant for PTSD, OSA, HTN, tobacco use, ?seizures, bipolar, degenerativse scoliosis who underwent extensive T10-pelvis decompression and fusion over 2 days(8/4 and 8/5) and difficulty waking up after surgery. Also complicated by Aki, rhabdomyolysis, DVT in RLE and left IJ s/p IVC filter and poor mentation/encephalopathy. Workup with CTH demonstrated embolic appearing strokes and neurology consulted.  #Stroke: bilateral cortical MCA/ACA watershed  area and left thalamic infarcts, concerning for perioperative hypotension in the setting of intracranial stenosis. Can not rule out cardioembolic source CT head Small recent appearing bilateral cortical infarcts, left thalamic and probable bilateral cerebellar infarcts.  MRI  unable d/t hardware and CRRT with multiple lines of meds Carotid Doppler w/LICA [id number] % stenosis, RICA not able to sonicate due to central line CTA Head on 09/11/20 with no significant intracranial stenosis MRI brain done today, result pending MRA head and neck pending 2D Echo w/EF 65 to 70 %, LA normal in size  BLE doppler showed right LE DVT TCD with Bubble was negative for PFO  TCD  mild elevated velocity of left MCA, right MCA and right ICA siphon, mild  stenosis in these vessels. LDL 30 HgbA1c 5.8 VTE prophylaxis - heparin IV No antithrombotic prior to admission, now on heparin IV given DVT. Continue heparin IV now given possible trach soon Therapy recommendations:  pending  #Seizure History  LTM this admission did  not show any seizures or epileptiform discharges.  LTM d/c 8/9 Continue with Lamictal 100 mg BID and Keppra 1000 mg BID.   DVT LE venous doppler showed right LE DVT Also left IJ thrombosis found during line placement S/p IVC filter Now on heparin IV  AKI with renal failure Cre 6.74->4.91->4.12->3.37->2.83->2.67->3.06 Was on CRRT -> off now Placed HD catheter  Likely HD tomorrow Nephrology on board  Severe anemia Hb 7.5->6.1-> PRBC->7.2->9.3->9.1->8.8 Continue CBC monitoring Continue heparin IV for now  Extensive back surgery Dr. Marcello Moores on board Status post surgery 8/4 and 8/5 Drainage removed 8/9  Respiratory failure Intubated on vent Off propofol Still on fentanyl CCM on board May need tracheostomy - continue heparin IV for now  #Hypertension Home meds:  norvasc Stable Long-term BP goal normotensive  Hyperlipidemia Home meds:  none  LDL 30, goal < 70 No statin needed at the time given LDL at goal  Tobacco abuse Current smoker Smoking cessation counseling will be provided when he is more alert.  Other Stroke Risk Factors ETOH use, alcohol level <10, advised to drink no more than 2 drink(s) a day Substance abuse - UDS:  reported THC use Obesity, Body mass index is 32.26 kg/m., BMI >/= 30 associated with increased stroke risk, recommend weight loss, diet and exercise as appropriate   Other Active Problems B12 deficiency, supplement Hypocalcemia   Hospital day # 10  This patient is critically ill due to kidney failure, respiratory failure, severe anemia, DVT, embolic stroke and at significant risk of neurological worsening, death form recurrent stroke, seizure, heart failure, renal failure, PE, sepsis, severe anemia. This patient's care requires constant monitoring of vital signs, hemodynamics, respiratory and cardiac monitoring, review of multiple databases, neurological assessment, discussion with family,  and medical decision making of high complexity. I  spent 30 minutes of neurocritical care time in the care of this patient.    Keith Garibaldi, MD 12/22/2020 4:29 PM   To contact Stroke Continuity provider, please refer to [email or web address] After hours, contact General Neurology

## 2020-12-22 NOTE — Progress Notes (Addendum)
Patient intubated and sedated.  Continues to require hemodialysis but is off pressors.  No new recommendations from our standpoint.  Continue efforts at hemodynamic and renal support as he recovers from his hypovolemic shock postoperatively.  Brain MRI scan for today pending to evaluate for changes of encephalopathy/possible infarction.

## 2020-12-22 NOTE — Progress Notes (Signed)
Patient ID: Keith Dixon, male   DOB: December 30, 1960, 60 y.o.   MRN: [id number] [facility] Progress Note   Assessment/ Plan:   1.  Acute kidney injury: Appears to be from a combination of ATN and rhabdomyolysis/pigment nephropathy.  He has dense renal injury.  He was started on CRRT on 8/7 for correction of multiple metabolic abnormalities and volume unloading.  Note that he also is charted as getting IV contrast on 8/7. S/p tunneled catheter with IR on 8/12 - HD today and for right now on a TTS schedule - would reduce gabapentin to no more than 300 mg daily given AKI  2.  Hyponatremia: Secondary to free water handling defect in the setting of acute kidney injury; Improved with CRRT  3.  Acute postoperative hypoxic/hypercapnic respiratory failure: Intubated and on ventilator support per CCM.  4.  Lumbar spine stenosis/scoliosis status post decompression and fusion surgery: Ongoing management/surveillance by neurosurgery.  5.  Shock - resolved Secondary to distributive/hypovolemic mechanism.  Status post intravenous fluids and pressors per primary.  Now off of pressors.  6. HTN controlled  7.  Acute blood loss anemia: Secondary to surgical associated losses, ongoing PRBC transfusion for continued downtrend of hemoglobin and hematocrit.  No ESA for now with acute CVA.  PRBC's on 8/9 and again on 8/10.  Gave additional unit PRBC's on 8/10.  8.  Acute multifocal bilateral CVA: Suggestive of embolic disease, supportive management per primary team   Disposition - continue inpatient monitoring  Subjective:   He was anuric over 8/12.  He had 2.9 liters UF over 8/11 with CRRT then CRRT stopped just after midnight 8/12.  S/p tunneled catheter on 8/12.     Review of systems:  Unable to obtain 2/2 mechanical ventilation    Objective:   BP (!) 152/73   Pulse (!) 115   Temp 99.6 F (37.6 C) (Axillary)   Resp (!) 30   Ht 6' (1.829 m)   Wt 107.9 kg   SpO2 95%   BMI 32.26 kg/m    Intake/Output Summary (Last 24 hours) at 12/22/2020 1443 Last data filed at 12/22/2020 0700 Gross per 24 hour  Intake 2939.94 ml  Output --  Net 2939.94 ml   Weight change:   Physical Exam:   General adult male in bed intubated HEENT normocephalic tracks visually Neck trachea midline Lungs coarse mechanical breath sounds Heart S1S2 no rub Abdomen soft nontender with sedation nondistended Extremities 1+ edema lower extremities Neuro - continuous sedation running; follows commands with right foot for me Access: tunneled dialysis catheter RIJ    Imaging: IR Fluoro Guide CV Line Right  Result Date: 12/21/2020 INDICATION: Need for long-term dialysis EXAM: TUNNELED CENTRAL VENOUS HEMODIALYSIS CATHETER PLACEMENT WITH ULTRASOUND AND FLUOROSCOPIC GUIDANCE MEDICATIONS: Cefazolin 2 g. The antibiotic was given in an appropriate time interval prior to skin puncture. ANESTHESIA/SEDATION: Moderate (conscious) sedation was employed during this procedure. A total of Versed 1 mg and Fentanyl 0 mcg was administered intravenously. Moderate Sedation Time: 21 minutes. The patient's level of consciousness and vital signs were monitored continuously by radiology nursing throughout the procedure under my direct supervision. FLUOROSCOPY TIME:  Fluoroscopy Time: 0 minutes 12 seconds (4 mGy). COMPLICATIONS: None immediate. PROCEDURE: Informed written consent was obtained from patient's family after a discussion of the risks, benefits, and alternatives to treatment. Questions regarding the procedure were encouraged and answered. The patient was placed supine on the exam table. The right neck and chest was prepped and draped in the  standard sterile fashion. Ultrasound was used to evaluate the right internal jugular vein, which found to be patent and suitable for access. An ultrasound image was saved. Using ultrasound guidance, the right internal jugular vein was rec punctured using a 21 gauge micropuncture set. An 018  wire was advanced into the SVC, followed by serial tract dilation and placement of an 035 wire which was advanced into the IVC. Attention was then turned to an infraclavicular location, or a small dermatotomy was made after the administration of additional local analgesia. From this location, a 19 cm tip to cuff cuffed hemodialysis catheter was tunneled to the venotomy site. A peel-away sheath was then advanced over the access wire. Through this peel-away sheath, the hemodialysis catheter was advanced into the central veins, assess the tip was positioned near the superior cavoatrial junction. The line was found to flush and aspirate appropriately. It was locked with the appropriate volume of heparinized saline. It was sutured to the skin using 0 silk suture, and the venotomy was closed with Dermabond. A sterile dressing was placed. The patient tolerated the procedure well without immediate complication. IMPRESSION: Successful placement of 19 cm tip to cuff tunneled hemodialysis catheter via the right internal jugular vein with tips terminating within the superior aspect of the right atrium. The catheter is ready for immediate use. Electronically Signed   By: Albin Felling MD   On: 12/21/2020 10:39   IR US Guide Vasc Access Right  Result Date: 12/21/2020 INDICATION: Need for long-term dialysis EXAM: TUNNELED CENTRAL VENOUS HEMODIALYSIS CATHETER PLACEMENT WITH ULTRASOUND AND FLUOROSCOPIC GUIDANCE MEDICATIONS: Cefazolin 2 g. The antibiotic was given in an appropriate time interval prior to skin puncture. ANESTHESIA/SEDATION: Moderate (conscious) sedation was employed during this procedure. A total of Versed 1 mg and Fentanyl 0 mcg was administered intravenously. Moderate Sedation Time: 21 minutes. The patient's level of consciousness and vital signs were monitored continuously by radiology nursing throughout the procedure under my direct supervision. FLUOROSCOPY TIME:  Fluoroscopy Time: 0 minutes 12 seconds (4  mGy). COMPLICATIONS: None immediate. PROCEDURE: Informed written consent was obtained from patient's family after a discussion of the risks, benefits, and alternatives to treatment. Questions regarding the procedure were encouraged and answered. The patient was placed supine on the exam table. The right neck and chest was prepped and draped in the standard sterile fashion. Ultrasound was used to evaluate the right internal jugular vein, which found to be patent and suitable for access. An ultrasound image was saved. Using ultrasound guidance, the right internal jugular vein was rec punctured using a 21 gauge micropuncture set. An 018 wire was advanced into the SVC, followed by serial tract dilation and placement of an 035 wire which was advanced into the IVC. Attention was then turned to an infraclavicular location, or a small dermatotomy was made after the administration of additional local analgesia. From this location, a 19 cm tip to cuff cuffed hemodialysis catheter was tunneled to the venotomy site. A peel-away sheath was then advanced over the access wire. Through this peel-away sheath, the hemodialysis catheter was advanced into the central veins, assess the tip was positioned near the superior cavoatrial junction. The line was found to flush and aspirate appropriately. It was locked with the appropriate volume of heparinized saline. It was sutured to the skin using 0 silk suture, and the venotomy was closed with Dermabond. A sterile dressing was placed. The patient tolerated the procedure well without immediate complication. IMPRESSION: Successful placement of 19 cm tip to cuff tunneled  hemodialysis catheter via the right internal jugular vein with tips terminating within the superior aspect of the right atrium. The catheter is ready for immediate use. Electronically Signed   By: Albin Felling MD   On: 12/21/2020 10:39   DG Abd Portable 1V  Result Date: 12/21/2020 CLINICAL DATA:  Orogastric tube  placement. EXAM: PORTABLE ABDOMEN - 1 VIEW COMPARISON:  December 20, 2020. FINDINGS: The bowel gas pattern is normal. Distal tip of enteric tube is seen in expected position of distal stomach. No radio-opaque calculi or other significant radiographic abnormality are seen. IMPRESSION: Distal tip of enteric tube seen in expected position of distal stomach. Electronically Signed   By: Marijo Conception M.D.   On: 12/21/2020 13:56   DG Abd Portable 1V  Result Date: 12/20/2020 CLINICAL DATA:  Orogastric tube placement. EXAM: PORTABLE ABDOMEN - 1 VIEW COMPARISON:  December 14, 2020 FINDINGS: Orogastric with tip and side port overlying the stomach. The bowel gas pattern is normal. Fusion hardware extends from the lower lumbar spine into the sacrum. Cutaneous skin staples project over the midline abdomen. IMPRESSION: Orogastric tube tip and side port overlie the stomach. Electronically Signed   By: Dahlia Bailiff MD   On: 12/20/2020 16:39    Labs: BMET Recent Labs  Lab 12/19/20 0518 12/19/20 1529 12/20/20 0339 12/20/20 1601 12/21/20 0517 12/21/20 1600 12/22/20 0521  NA 135 134* 137 136 134* 135 137  K 4.6 4.7 4.4 4.9 4.6 5.1 4.9  CL 102 101 103 103 101 102 101  CO2 _0 GLUCOSE 121* 116* 145* 131* 121* 107* 135*  BUN 31* 32* 33* 36* 48* 63* 81*  CREATININE 2.83* 2.65* 2.67* 2.47* 3.06* 4.18* 4.95*  CALCIUM 8.0* 8.1* 8.3* 8.5* 8.5* 8.6* 8.9  PHOS 2.6 2.6 2.2* 2.6 2.5 3.7 3.2   CBC Recent Labs  Lab 12/19/20 0518 12/19/20 1731 12/20/20 0339 12/21/20 0517 12/22/20 0521  WBC 7.1 8.8 9.5 13.4* 12.7*  NEUTROABS 5.2  --   --   --   --   HGB 7.2* 9.3* 9.1* 8.8* 8.0*  HCT 21.7* 27.2* 26.6* 27.0* 24.8*  MCV 83.1 83.2 83.9 85.2 86.7  PLT 132* 170 173 211 243   Medications:     albuterol  2.5 mg Nebulization Q6H   alteplase  2 mg Intracatheter Once   amLODipine  10 mg Per Tube q AM   vitamin C  250 mg Per Tube Daily   bacitracin   Topical Daily   chlorhexidine gluconate (MEDLINE  KIT)  15 mL Mouth Rinse BID   Chlorhexidine Gluconate Cloth  6 each Topical Daily   Chlorhexidine Gluconate Cloth  6 each Topical Q0600   cholecalciferol  2,000 Units Per Tube BID   ciprofloxacin  1 drop Left Eye Q4H while awake   cyanocobalamin  1,000 mcg Intramuscular Daily   Followed by   Derrill Memo ON 12/23/2020] vitamin B-12  1,000 mcg Oral Daily   docusate  100 mg Per Tube BID   folic acid  1 mg Intravenous Daily   gabapentin  300 mg Per Tube Q12H   guaiFENesin  10 mL Per Tube BID   lamoTRIgine  100 mg Per Tube BID   levETIRAcetam  1,000 mg Per Tube BID   mouth rinse  15 mL Mouth Rinse 10 times per day   pantoprazole sodium  40 mg Per Tube QHS   polyethylene glycol  17 g Per Tube Daily   QUEtiapine  200  mg Per Tube QHS   sertraline  100 mg Per Tube QPC breakfast   sodium chloride flush  3 mL Intravenous Q12H   [START ON 12/25/2020] thiamine injection  100 mg Intravenous Daily   zinc sulfate  220 mg Per Tube Daily   Claudia Desanctis, MD 12/22/2020, 9:19 AM

## 2020-12-23 DIAGNOSIS — J9601 Acute respiratory failure with hypoxia: Secondary | ICD-10-CM | POA: Diagnosis not present

## 2020-12-23 DIAGNOSIS — J9602 Acute respiratory failure with hypercapnia: Secondary | ICD-10-CM | POA: Diagnosis not present

## 2020-12-23 LAB — RENAL FUNCTION PANEL
Albumin: 1.7 g/dL — ABNORMAL LOW (ref 3.5–5.0)
Albumin: 1.8 g/dL — ABNORMAL LOW (ref 3.5–5.0)
Anion gap: 10 (ref 5–15)
Anion gap: 10 (ref 5–15)
BUN: 109 mg/dL — ABNORMAL HIGH (ref 6–20)
BUN: 119 mg/dL — ABNORMAL HIGH (ref 6–20)
CO2: 23 mmol/L (ref 22–32)
CO2: 23 mmol/L (ref 22–32)
Calcium: 8.8 mg/dL — ABNORMAL LOW (ref 8.9–10.3)
Calcium: 9.1 mg/dL (ref 8.9–10.3)
Chloride: 105 mmol/L (ref 98–111)
Chloride: 105 mmol/L (ref 98–111)
Creatinine, Ser: 6.57 mg/dL — ABNORMAL HIGH (ref 0.61–1.24)
Creatinine, Ser: 6.92 mg/dL — ABNORMAL HIGH (ref 0.61–1.24)
GFR, Estimated: 9 mL/min — ABNORMAL LOW (ref >60)
GFR, Estimated: 9 mL/min — ABNORMAL LOW (ref >60)
Glucose, Bld: 118 mg/dL — ABNORMAL HIGH (ref 70–99)
Glucose, Bld: 123 mg/dL — ABNORMAL HIGH (ref 70–99)
Phosphorus: 4.5 mg/dL (ref 2.5–4.6)
Phosphorus: 5 mg/dL — ABNORMAL HIGH (ref 2.5–4.6)
Potassium: 5.7 mmol/L — ABNORMAL HIGH (ref 3.5–5.1)
Potassium: 5.9 mmol/L — ABNORMAL HIGH (ref 3.5–5.1)
Sodium: 138 mmol/L (ref 135–145)
Sodium: 138 mmol/L (ref 135–145)

## 2020-12-23 LAB — GLUCOSE, CAPILLARY
Glucose-Capillary: 124 mg/dL — ABNORMAL HIGH (ref 70–99)
Glucose-Capillary: 136 mg/dL — ABNORMAL HIGH (ref 70–99)
Glucose-Capillary: 116 mg/dL — ABNORMAL HIGH (ref 70–99)
Glucose-Capillary: 127 mg/dL — ABNORMAL HIGH (ref 70–99)

## 2020-12-23 LAB — CBC
HCT: 22.8 % — ABNORMAL LOW (ref 39.0–52.0)
Hemoglobin: 7.4 g/dL — ABNORMAL LOW (ref 13.0–17.0)
MCH: 28.1 pg (ref 26.0–34.0)
MCHC: 32.5 g/dL (ref 30.0–36.0)
MCV: 86.7 fL (ref 80.0–100.0)
Platelets: 233 10*3/uL (ref 150–400)
RBC: 2.63 MIL/uL — ABNORMAL LOW (ref 4.22–5.81)
RDW: 17.3 % — ABNORMAL HIGH (ref 11.5–15.5)
WBC: 11.9 10*3/uL — ABNORMAL HIGH (ref 4.0–10.5)
nRBC: 0 % (ref 0.0–0.2)

## 2020-12-23 LAB — APTT
aPTT: 54 seconds — ABNORMAL HIGH (ref 24–36)
aPTT: 50 seconds — ABNORMAL HIGH (ref 24–36)

## 2020-12-23 LAB — HEPATITIS B CORE ANTIBODY, TOTAL: Hep B Core Total Ab: NONREACTIVE

## 2020-12-23 LAB — HEPATITIS B SURFACE ANTIGEN: Hepatitis B Surface Ag: NONREACTIVE

## 2020-12-23 LAB — POTASSIUM: Potassium: 4.8 mmol/L (ref 3.5–5.1)

## 2020-12-23 LAB — MAGNESIUM: Magnesium: 2.9 mg/dL — ABNORMAL HIGH (ref 1.7–2.4)

## 2020-12-23 MED ORDER — LEVETIRACETAM 100 MG/ML PO SOLN
500.0000 mg | ORAL | Status: DC
  Administered 2020-12-25 – 2020-12-27 (×2): 500 mg
  Filled 2020-12-23 (×2): qty 5

## 2020-12-23 MED ORDER — NEPRO/CARBSTEADY PO LIQD
1000.0000 mL | ORAL | Status: DC
  Filled 2020-12-23: qty 1000

## 2020-12-23 MED ORDER — HEPARIN SODIUM (PORCINE) 1000 UNIT/ML IJ SOLN
INTRAMUSCULAR | Status: AC
  Administered 2020-12-23: 1000 [IU]
  Filled 2020-12-23: qty 4

## 2020-12-23 MED ORDER — VITAMIN B-12 1000 MCG PO TABS
1000.0000 ug | ORAL_TABLET | Freq: Every day | ORAL | Status: DC
  Administered 2020-12-23 – 2021-01-08 (×17): 1000 ug
  Filled 2020-12-23 (×16): qty 1

## 2020-12-23 MED ORDER — LEVETIRACETAM 100 MG/ML PO SOLN
500.0000 mg | Freq: Two times a day (BID) | ORAL | Status: DC
  Administered 2020-12-23 – 2021-01-01 (×19): 500 mg
  Filled 2020-12-23 (×18): qty 5

## 2020-12-23 MED ORDER — FOLIC ACID 1 MG PO TABS
1.0000 mg | ORAL_TABLET | Freq: Every day | ORAL | Status: DC
  Administered 2020-12-23 – 2021-01-08 (×17): 1 mg
  Filled 2020-12-23 (×17): qty 1

## 2020-12-23 MED ORDER — NEPRO/CARBSTEADY PO LIQD
1000.0000 mL | ORAL | Status: DC
  Administered 2020-12-23 – 2020-12-24 (×2): 1000 mL
  Filled 2020-12-23 (×6): qty 1000

## 2020-12-23 NOTE — Progress Notes (Signed)
No new problems or issues overnight.  Patient continues to require hemodialysis.  His hemodynamic parameters are otherwise more stable.  Remains intubated and ventilated.  MRI scan of the brain unfortunately demonstrates evidence of significant widespread hypoperfusion insults to his brain.  Patient will awaken.  He moves all 4 extremities occasionally to command.  He makes no attempts at verbalization.  Overall progressing slowly.  Patient with extensive anterior posterior spinal surgery complicated by postoperative shock and renal failure and pulmonary failure.  MRI scan now demonstrates evidence of hypoperfusion insults to his brain.  No new recommendations from our standpoint.  Continue current care.

## 2020-12-23 NOTE — Progress Notes (Signed)
[place] for heparin Indication: DVT  No Known Allergies  Patient Measurements: Height: 6' (182.9 cm) Weight: 107.9 kg (237 lb 14 oz) IBW/kg (Calculated) : 77.6  Vital Signs: Temp: 98.9 F (37.2 C) (08/14 0000) Temp Source: Tympanic (08/14 0000) BP: 113/62 (08/13 2200) Pulse Rate: 81 (08/13 2200)  Labs: Recent Labs    12/20/20 0339 12/20/20 1313 12/21/20 0517 12/21/20 0613 12/21/20 1600 12/21/20 1830 12/22/20 0521 12/22/20 1427 12/22/20 1600 12/23/20 0000  HGB 9.1*  --  8.8*  --   --   --  8.0*  --   --   --   HCT 26.6*  --  27.0*  --   --   --  24.8*  --   --   --   PLT 173  --  211  --   --   --  243  --   --   --   APTT 44*   < >  --    < >  --    < > 34 41*  --  54*  CREATININE 2.67*   < > 3.06*  --  4.18*  --  4.95*  --  5.90*  --   CKTOTAL 1,736*  --   --   --   --   --   --   --   --   --    < > = values in this interval not displayed.     Estimated Creatinine Clearance: 17.3 mL/min (A) (by C-G formula based on SCr of 5.9 mg/dL (H)).  Assessment: 60 yo male with RLE DVT s/p recent lumbar fusion, for heparin surgery. Patient has been on CRRT until yesterday with heparin running in the CRRT circuit at 750 units/hr. CRRT and circuit heparin were d/c'ed on 8/11.   aPTT this AM on 1800 units/hr of IV heparin was down to 39 likely due to stoppage of CRRT heparin. Patient had issues with bleeding near the wound incision site but this has stopped. Neurosurgery has assessed the patient and is okay with continuing heparin. Of note, heparin was stopped this AM for tunneled HD catheter placement.   Evening aptt low at 31 after rate increased to 2000 units/hr. No bleeding issues noted currently, will titrate rate.   8/14 AM update:  aPTT therapeutic after rate increase  Goal of Therapy:  aPTT 50-75 seconds.  Monitor platelets by anticoagulation protocol: Yes   Plan:  Cont heparin at 2600 units/hr Confirmatory aPTT with AM  labs Monitor closely for any s/s of bleeding near incision site.   Narda Bonds, PharmD, BCPS Clinical Pharmacist Phone: [phone]

## 2020-12-23 NOTE — Progress Notes (Signed)
Patient ID: Keith Dixon, male   DOB: Aug 31, 1960, 60 y.o.   MRN: [id number] STROKE TEAM PROGRESS NOTE   INTERVAL HISTORY Will open his eyes to voice/command.  No sig change from yesterday.  No events overnight. Seen by Neurosurgery today.   Vitals:   12/23/20 1528 12/23/20 1530 12/23/20 1545 12/23/20 1600  BP:  106/60 123/73 127/67  Pulse:  69 76 81  Resp:  20 (!) 21 20  Temp:      TempSrc:      SpO2: 97% 97% 99% 96%  Weight:   108.7 kg   Height:       CBC:  Recent Labs  Lab 12/19/20 0518 12/19/20 1731 12/22/20 0521 12/23/20 0526  WBC 7.1   < > 12.7* 11.9*  NEUTROABS 5.2  --   --   --   HGB 7.2*   < > 8.0* 7.4*  HCT 21.7*   < > 24.8* 22.8*  MCV 83.1   < > 86.7 86.7  PLT 132*   < > 243 233   < > = values in this interval not displayed.   Basic Metabolic Panel:  Recent Labs  Lab 12/22/20 0521 12/22/20 1600 12/23/20 0526  NA 137 136 138  K 4.9 5.1 5.7*  CL 101 103 105  CO2 '24 23 23  '$ GLUCOSE 135* 133* 118*  BUN 81* 94* 109*  CREATININE 4.95* 5.90* 6.57*  CALCIUM 8.9 8.7* 8.8*  MG 2.8*  --  2.9*  PHOS 3.2 3.6 4.5   Lipid Panel:  Recent Labs  Lab 12/16/20 1623 12/17/20 0500 12/20/20 0339  CHOL 89  --   --   TRIG 235*   < > 213*  HDL 12*  --   --   CHOLHDL 7.4  --   --   VLDL 47*  --   --   LDLCALC 30  --   --    < > = values in this interval not displayed.   HgbA1c:  Recent Labs  Lab 12/17/20 0500  HGBA1C 5.8*    IMAGING past 24 hours MR ANGIO HEAD WO CONTRAST  Result Date: 12/22/2020 CLINICAL DATA:  Stroke, follow-up. Subacute left thalamic infarction. Parietal and occipital infarctions. EXAM: MRI HEAD WITHOUT CONTRAST MRA HEAD WITHOUT CONTRAST MRA NECK WITHOUT CONTRAST TECHNIQUE: Multiplanar, multiecho pulse sequences of the brain and surrounding structures were obtained without intravenous contrast. Angiographic images of the Circle of Willis were obtained using MRA technique without intravenous contrast. Angiographic images of the neck were  obtained using MRA technique without intravenous contrast. Carotid stenosis measurements (when applicable) are obtained utilizing NASCET criteria, using the distal internal carotid diameter as the denominator. COMPARISON:  CT 12/18/2020.  MRI 09/11/2020. FINDINGS: MRI HEAD FINDINGS Brain: Diffusion imaging shows subacute infarctions within the cerebellar hemispheres. Subacute infarction of the left thalamus as seen previously. Punctate acute/subacute infarction in the right thalamus. Numerous acute/subacute infarctions seen affecting both cerebral hemispheres in a front to back distribution typical of watershed infarctions. No large confluent infarction. No evidence of hemorrhage or swelling. No hydrocephalus or extra-axial collection. Vascular: Major vessels at the base of the brain show flow. Skull and upper cervical spine: Negative Sinuses/Orbits: Clear/normal Other: Sebaceous cyst of the right frontal scalp. Bilateral mastoid effusions. MRA HEAD FINDINGS Both internal carotid arteries are widely patent into the brain. No siphon stenosis. The anterior and middle cerebral vessels are patent without proximal stenosis, aneurysm or vascular malformation. Both vertebral arteries are widely patent to the basilar. No basilar stenosis.  Posterior circulation branch vessels appear normal. MRA NECK FINDINGS Both common carotid arteries are widely patent to the bifurcation. Both carotid bifurcations appear widely patent. Vertebral artery origins are poorly seen using this technique, but the vessels appear normal beyond the origins, through the cervical region to the basilar. IMPRESSION: Multiple acute/subacute infarctions noted within the cerebellum, thalami (left larger than right, and both cerebral hemispheres running from front to back in a watershed distribution. Most of these were probably acute on the study of 12/16/2020. I cannot establish any definite new insult since the previous CT exam. MR angiography of the neck  vessels without contrast does not show any abnormal finding in the neck. Detail is somewhat limited without contrast enhanced technique. MR angiography of the intracranial vessels is normal. Electronically Signed   By: Nelson Chimes M.D.   On: 12/22/2020 18:35   MR ANGIO NECK WO CONTRAST  Result Date: 12/22/2020 CLINICAL DATA:  Stroke, follow-up. Subacute left thalamic infarction. Parietal and occipital infarctions. EXAM: MRI HEAD WITHOUT CONTRAST MRA HEAD WITHOUT CONTRAST MRA NECK WITHOUT CONTRAST TECHNIQUE: Multiplanar, multiecho pulse sequences of the brain and surrounding structures were obtained without intravenous contrast. Angiographic images of the Circle of Willis were obtained using MRA technique without intravenous contrast. Angiographic images of the neck were obtained using MRA technique without intravenous contrast. Carotid stenosis measurements (when applicable) are obtained utilizing NASCET criteria, using the distal internal carotid diameter as the denominator. COMPARISON:  CT 12/18/2020.  MRI 09/11/2020. FINDINGS: MRI HEAD FINDINGS Brain: Diffusion imaging shows subacute infarctions within the cerebellar hemispheres. Subacute infarction of the left thalamus as seen previously. Punctate acute/subacute infarction in the right thalamus. Numerous acute/subacute infarctions seen affecting both cerebral hemispheres in a front to back distribution typical of watershed infarctions. No large confluent infarction. No evidence of hemorrhage or swelling. No hydrocephalus or extra-axial collection. Vascular: Major vessels at the base of the brain show flow. Skull and upper cervical spine: Negative Sinuses/Orbits: Clear/normal Other: Sebaceous cyst of the right frontal scalp. Bilateral mastoid effusions. MRA HEAD FINDINGS Both internal carotid arteries are widely patent into the brain. No siphon stenosis. The anterior and middle cerebral vessels are patent without proximal stenosis, aneurysm or vascular  malformation. Both vertebral arteries are widely patent to the basilar. No basilar stenosis. Posterior circulation branch vessels appear normal. MRA NECK FINDINGS Both common carotid arteries are widely patent to the bifurcation. Both carotid bifurcations appear widely patent. Vertebral artery origins are poorly seen using this technique, but the vessels appear normal beyond the origins, through the cervical region to the basilar. IMPRESSION: Multiple acute/subacute infarctions noted within the cerebellum, thalami (left larger than right, and both cerebral hemispheres running from front to back in a watershed distribution. Most of these were probably acute on the study of 12/16/2020. I cannot establish any definite new insult since the previous CT exam. MR angiography of the neck vessels without contrast does not show any abnormal finding in the neck. Detail is somewhat limited without contrast enhanced technique. MR angiography of the intracranial vessels is normal. Electronically Signed   By: Nelson Chimes M.D.   On: 12/22/2020 18:35   MR BRAIN WO CONTRAST  Result Date: 12/22/2020 CLINICAL DATA:  Stroke, follow-up. Subacute left thalamic infarction. Parietal and occipital infarctions. EXAM: MRI HEAD WITHOUT CONTRAST MRA HEAD WITHOUT CONTRAST MRA NECK WITHOUT CONTRAST TECHNIQUE: Multiplanar, multiecho pulse sequences of the brain and surrounding structures were obtained without intravenous contrast. Angiographic images of the Circle of Willis were obtained using MRA technique without  intravenous contrast. Angiographic images of the neck were obtained using MRA technique without intravenous contrast. Carotid stenosis measurements (when applicable) are obtained utilizing NASCET criteria, using the distal internal carotid diameter as the denominator. COMPARISON:  CT 12/18/2020.  MRI 09/11/2020. FINDINGS: MRI HEAD FINDINGS Brain: Diffusion imaging shows subacute infarctions within the cerebellar hemispheres.  Subacute infarction of the left thalamus as seen previously. Punctate acute/subacute infarction in the right thalamus. Numerous acute/subacute infarctions seen affecting both cerebral hemispheres in a front to back distribution typical of watershed infarctions. No large confluent infarction. No evidence of hemorrhage or swelling. No hydrocephalus or extra-axial collection. Vascular: Major vessels at the base of the brain show flow. Skull and upper cervical spine: Negative Sinuses/Orbits: Clear/normal Other: Sebaceous cyst of the right frontal scalp. Bilateral mastoid effusions. MRA HEAD FINDINGS Both internal carotid arteries are widely patent into the brain. No siphon stenosis. The anterior and middle cerebral vessels are patent without proximal stenosis, aneurysm or vascular malformation. Both vertebral arteries are widely patent to the basilar. No basilar stenosis. Posterior circulation branch vessels appear normal. MRA NECK FINDINGS Both common carotid arteries are widely patent to the bifurcation. Both carotid bifurcations appear widely patent. Vertebral artery origins are poorly seen using this technique, but the vessels appear normal beyond the origins, through the cervical region to the basilar. IMPRESSION: Multiple acute/subacute infarctions noted within the cerebellum, thalami (left larger than right, and both cerebral hemispheres running from front to back in a watershed distribution. Most of these were probably acute on the study of 12/16/2020. I cannot establish any definite new insult since the previous CT exam. MR angiography of the neck vessels without contrast does not show any abnormal finding in the neck. Detail is somewhat limited without contrast enhanced technique. MR angiography of the intracranial vessels is normal. Electronically Signed   By: Nelson Chimes M.D.   On: 12/22/2020 18:35     Physical Exam   Temp:  [98.2 F (36.8 C)-99.6 F (37.6 C)] 98.2 F (36.8 C) (08/14 0800) Pulse  Rate:  [60-94] 81 (08/14 1600) Resp:  [14-26] 20 (08/14 1600) BP: (94-139)/(49-98) 127/67 (08/14 1600) SpO2:  [93 %-100 %] 96 % (08/14 1600) FiO2 (%):  [40 %] 40 % (08/14 1528) Weight:  [108.7 kg] 108.7 kg (08/14 1545)  General - Well nourished, well developed,   Ophthalmologic - fundi not visualized due to noncooperation.  Cardiovascular - Regular rate and rhythm.  Neuro - Intubated. On precedex for pain. Eyes midline, not blinking to visual threat, +doll's eyes.  not tracking, bilateral pupil 2 mm sluggish to light. Corneal reflex weakly present +gag and cough present.  Facial symmetry not able to test due to ET tube.  Tongue protrusion not cooperative/intubated. No withdrawal to pain in all 4 ext. He did have spontaneous movements of his toes/feet and right arm.  DTR diminished and no babinski. Sensation, coordination and gait not tested.     Assessment and Plan  PERLE PAXSON is a 60 y.o. male with PMH significant for PTSD, OSA, HTN, tobacco use, ?seizures, bipolar, degenerativse scoliosis who underwent extensive T10-pelvis decompression and fusion over 2 days(8/4 and 8/5) and difficulty waking up after surgery. Also complicated by Aki, rhabdomyolysis, DVT in RLE and left IJ s/p IVC filter and poor mentation/encephalopathy. Workup with CTH demonstrated embolic appearing strokes and neurology consulted.  #Stroke: bilateral cortical MCA/ACA watershed area and left thalamic infarcts, concerning for perioperative hypotension in the setting of intracranial stenosis. Can not rule out cardioembolic source CT head  Small recent appearing bilateral cortical infarcts, left thalamic and probable bilateral cerebellar infarcts.  MRI  unable d/t hardware and CRRT with multiple lines of meds Carotid Doppler w/LICA [id number] % stenosis, RICA not able to sonicate due to central line CTA Head on 09/11/20 with no significant intracranial stenosis MRI brain watershed infarcts in cerebellum, thalami. MRA head  and neck Neg. 2D Echo w/EF 65 to 70 %, LA normal in size  BLE doppler showed right LE DVT TCD with Bubble was negative for PFO  TCD  mild elevated velocity of left MCA, right MCA and right ICA siphon, mild  stenosis in these vessels. LDL 30 HgbA1c 5.8 VTE prophylaxis - heparin IV No antithrombotic prior to admission, now on heparin IV given DVT. Continue heparin IV now given possible trach soon Therapy recommendations:  pending  #Seizure History  LTM this admission did not show any seizures or epileptiform discharges.  LTM d/c 8/9 Continue with Lamictal 100 mg BID and Keppra 1000 mg BID.   DVT LE venous doppler showed right LE DVT Also left IJ thrombosis found during line placement S/p IVC filter Now on heparin IV  AKI with renal failure Cre 6.74->4.91->4.12->3.37->2.83->2.67->3.06 Was on CRRT -> off now Placed HD catheter  Likely HD tomorrow Nephrology on board  Severe anemia Hb 7.5->6.1-> PRBC->7.2->9.3->9.1->8.8 Continue CBC monitoring Continue heparin IV for now  Extensive back surgery Dr. Marcello Moores on board Status post surgery 8/4 and 8/5 Drainage removed 8/9  Respiratory failure Intubated on vent Off propofol Still on fentanyl CCM on board May need tracheostomy - continue heparin IV for now  #Hypertension Home meds:  norvasc Stable Long-term BP goal normotensive  Hyperlipidemia Home meds:  none  LDL 30, goal < 70 No statin needed at the time given LDL at goal  Tobacco abuse Current smoker Smoking cessation counseling will be provided when he is more alert.  Other Stroke Risk Factors ETOH use, alcohol level <10, advised to drink no more than 2 drink(s) a day Substance abuse - UDS:  reported THC use Obesity, Body mass index is 32.5 kg/m., BMI >/= 30 associated with increased stroke risk, recommend weight loss, diet and exercise as appropriate   Other Active Problems B12 deficiency, supplement Hypocalcemia   Hospital day # 11  This patient is  critically ill due to kidney failure, respiratory failure, severe anemia, DVT, embolic stroke and at significant risk of neurological worsening, death form recurrent stroke, seizure, heart failure, renal failure, PE, sepsis, severe anemia. This patient's care requires constant monitoring of vital signs, hemodynamics, respiratory and cardiac monitoring, review of multiple databases, neurological assessment, discussion with family,  and medical decision making of high complexity. I spent 30 minutes of neurocritical care time in the care of this patient.    [name], MD 12/23/2020 4:06 PM   To contact Stroke Continuity provider, please refer to [email or web address] After hours, contact General Neurology

## 2020-12-23 NOTE — Progress Notes (Signed)
NAME:  Keith Dixon, MRN:  [id number], DOB:  12-Oct-1960, LOS: 74 ADMISSION DATE:  12/12/2020, CONSULTATION DATE:  12/23/20  REFERRING MD:  Grandville Silos, CHIEF COMPLAINT:   respiratory depression  History of Present Illness:  Keith Dixon is a 60 y/o M with PMH significant for severe degenerative scoliosis with multi-level listhesis, anxiety, bipolar disorder, PTSD, seizures, OSA and tobacco use who was admitted to neurosurgery for L1-2, L2-3, L3-4, L4-5 DLIFs initially and subsequent T10-pelvis decompression and fusion.   His initial surgery was 8/3 and was slow to wake up, after second  surgery 8/4  (10hr procedure) pt was again slow to awaken post-anesthesia, so decision was made to leave patient intubated overnight.    Significant Hospital Events:  8/3 Admit to neurosurgery, underwent  anterior lateral Interbody fusion of L1-L5, notably had difficult foley insertion requiring urethral dilation 8/4 Stage 2 posterior decompression and fusion, left intubated post-procedure and PCCM consulted 8/5 AKI seen on am labs with evidence of volume depletion on POC [place] 8/6: Patient AKI was due to acute rhabdomyolysis in the setting of prolonged surgery, he remained oliguric.  Ultrasound lower extremity showed acute right lower extremity DVT and left IJ DVT, IVC filter placed 8/7: Patient serum creatinine continues to trend up, he remained oliguric, started on CRRT Head CT showed small multiple bilateral strokes.  EEG was placed  8/9>. No seizures per EEG, remains on Levo, Propofol and fentanyl with good vent synchrony. Remains on CVVH with UF goal of 50 cc per hour, but increasing pressor needs this am  8/11: off pressors, mental status precluding extubation 8/12: Tunneled HD line placement 8/13: Tolerating some PSV on fentanyl 8/13 MRI/A brain > multiple acute/subacute CVA in the cerebellum, left > right thalami, bilateral cerebral hemispheres.  No abnormal findings in the neck, normal  angiography  Interim History / Subjective:  Fentanyl 200 Precedex added 8/13, 0.5 0.40, PEEP 5 No urine output recorded last 2 days, I/O+ 14.6 L total HD was not done yesterday K5.7  Objective   Blood pressure (!) 108/57, pulse 74, temperature 99.6 F (37.6 C), temperature source Oral, resp. rate 19, height 6' (1.829 m), weight 107.9 kg, SpO2 96 %.    Vent Mode: CPAP;PSV FiO2 (%):  [40 %] 40 % Set Rate:  [16 bmp] 16 bmp Vt Set:  [620 mL] 620 mL PEEP:  [5 cmH20] 5 cmH20 Pressure Support:  [10 cmH20] 10 cmH20 Plateau Pressure:  [17 cmH20-18 cmH20] 17 cmH20   Intake/Output Summary (Last 24 hours) at 12/23/2020 0804 Last data filed at 12/23/2020 0600 Gross per 24 hour  Intake 1993.25 ml  Output --  Net 1993.25 ml   Filed Weights   12/18/20 0900 12/19/20 0348 12/21/20 0400  Weight: 115.9 kg 115 kg 107.9 kg   Physical Exam  General: Ill-appearing gentleman, obese, intubated HEENT: Pupils equal, ET tube in good position Neuro: Opens eyes to voice, tracks, follows commands on the left, weak cough.  A bit more sleepy today than 8/13 CV: Tachycardic, regular, 1+ lower extremity edema PULM: Coarse bilateral breath sounds, comfortable respiratory pattern, no wheeze GI: Nondistended, obese, positive bowel sounds Skin: Pressure. dressings on bilateral heels.  Chest wall hematoma smaller  Assessment & Plan:   Acute post operative hypoxic and hypercapnic respiratory respiratory failure requiring mechanical ventilation  Rare gram-negative's on 8/9 respiratory culture > normal flora Tachypneic on SBT 8/13, question component of work of breathing, agitation Plan -Continue SBT as mental status improves.  Hopefully he will be a candidate  for extubation.  Will reassess again today 8/14 after HD.  May need to consider tracheostomy depending on pace of improvement.  Some agitation was a barrier on 8/13, Precedex added.  Try to minimize fentanyl if able -VAP prevention orders  Acute  encephalopathy, agitation with mechanical ventilation -Continue fentanyl infusion, moderate dose, continue Precedex started 8/13 -Continue Seroquel, consider uptitrate  RRT dependent acute Oliguric renal failure secondary to rhabdomyolysis and ATN this admission. Electrolytes stable Plan -Appreciate nephrology management -Intermittent HD as per nephrology plans, planning for today 8/14 -Recommend decrease gabapentin, 300 mg daily  Transient fever -none over last 48 hours -Continue to follow WBC, clinical status.  Plan to repeat cultures if fever recurs  Acute right lower extremity DVT, acute left IJ DVT s/p IVC filter placement -Continue Heparin infusion, targeting lower end of therapeutic range until okay to increase per neurosurgery plans  Acute on chronic anemia. S/p total of 6U PRBC transfused 8/4-8/10. -Following CBC  Acute bilateral cortical and subcortical MCA/ACA watershed territory infarcts Ddx post operative hypotension vs embolic source. No embolic source identified--no thrombus on echocardiogram, TCD negative for PFO. Unfortunately, renal failure is precluding CTA for further evaluation for intracranial stenosis.  Mental status/ sedation has precluded complete neurologic evaluation  Plan -Appreciate neurology assistance -Heparin infusion as ordered  Hypertension -Continue amlodipine  Hx of seizure disorder. No epileptic activity on EEG -Continue home Keppra and Lamictal  Lumbar spinal stenosis s/p decompression and fusion -Pain control -Antibiotics completed  High risk malnutrition -Continue tube feeding  Deep pressure injury of left heel. -Continue wound care  Chest hematoma.  Does not appear to be expanding on heparin fusion -Following  Best Practice    Diet/type: NPO, TF DVT prophylaxis: heparin gtt GI prophylaxis: PPI Lines: Right IJ, Left femoral HD line, Right Radial A-line, Right peripheral IV, ETT, NG Foley: Removed 8/11 Code Status:  full  code Last date of multidisciplinary goals of care discussion: 8/11 patient's family was updated at bedside  Independent critical care time 33 minutes  Baltazar Apo, MD, PhD 12/23/2020, 8:04 AM [facility] [phone] or if no answer before 7:00PM call [phone] For any issues after 7:00PM please call eLink [phone]

## 2020-12-23 NOTE — Progress Notes (Signed)
Patient ID: Keith Dixon, male   DOB: 28-May-1960, 60 y.o.   MRN: [id number] [facility] Progress Note   Assessment/ Plan:   1.  Acute kidney injury: Appears to be from a combination of ATN and rhabdomyolysis/pigment nephropathy.  He has dense renal injury.  He was started on CRRT on 8/7 for correction of multiple metabolic abnormalities and volume unloading.  Note that he also is charted as getting IV contrast on 8/7. S/p tunneled catheter with IR on 8/12 - For first HD today (his tx was moved due to pt volumes and another emergent HD need) - Assess needs daily.  Tentatively for right now on a TTS schedule - changed to nepro for hyperkalemia   2. Hyperkalemia - HD today and changed his feeds to nepro  3.  Hyponatremia: Secondary to free water handling defect in the setting of acute kidney injury; Improved with RRT  4.  Acute postoperative hypoxic/hypercapnic respiratory failure: Intubated and on ventilator support per CCM.  5.  Lumbar spine stenosis/scoliosis status post decompression and fusion surgery: Ongoing management/surveillance by neurosurgery.  6.  Shock - resolved Secondary to distributive/hypovolemic mechanism.  Status post intravenous fluids and pressors per primary.  Now off of pressors.  7. HTN controlled   8.  Acute blood loss anemia: Secondary to surgical associated losses, ongoing PRBC transfusion for continued downtrend of hemoglobin and hematocrit.  No ESA for now with acute CVA.  PRBC's on 8/9 and again on 8/10.  Gave additional unit PRBC's on 8/10.  9.  Acute multifocal bilateral CVA: Suggestive of embolic disease, supportive management per primary team   10. Acute DVT - anticoagulation per primary team  Disposition - continue inpatient monitoring  Subjective:   He was anuric over 8/13.  He had 2.9 liters UF over 8/11 with CRRT then CRRT stopped.  He didn't get HD on 8/13 due to pt volumes/staffing.  Called HD RN and confirmed he is on for treatment  today.   Review of systems:   Unable to obtain 2/2 mechanical ventilation    Objective:   BP (!) 127/98   Pulse 87   Temp 98.2 F (36.8 C) (Axillary)   Resp 19   Ht 6' (1.829 m)   Wt 107.9 kg   SpO2 93%   BMI 32.26 kg/m   Intake/Output Summary (Last 24 hours) at 12/23/2020 9417 Last data filed at 12/23/2020 0600 Gross per 24 hour  Intake 1944.68 ml  Output --  Net 1944.68 ml   Weight change:   Physical Exam:   General adult male in bed intubated HEENT normocephalic intermittently tracks  Neck trachea midline Lungs coarse mechanical breath sounds Heart S1S2 no rub Abdomen soft nontender with sedation distended Extremities 1+ edema lower extremities Neuro - continuous sedation running Access: tunneled dialysis catheter RIJ    Imaging: MR ANGIO HEAD WO CONTRAST  Result Date: 12/22/2020 CLINICAL DATA:  Stroke, follow-up. Subacute left thalamic infarction. Parietal and occipital infarctions. EXAM: MRI HEAD WITHOUT CONTRAST MRA HEAD WITHOUT CONTRAST MRA NECK WITHOUT CONTRAST TECHNIQUE: Multiplanar, multiecho pulse sequences of the brain and surrounding structures were obtained without intravenous contrast. Angiographic images of the Circle of Willis were obtained using MRA technique without intravenous contrast. Angiographic images of the neck were obtained using MRA technique without intravenous contrast. Carotid stenosis measurements (when applicable) are obtained utilizing NASCET criteria, using the distal internal carotid diameter as the denominator. COMPARISON:  CT 12/18/2020.  MRI 09/11/2020. FINDINGS: MRI HEAD FINDINGS Brain: Diffusion imaging shows subacute  infarctions within the cerebellar hemispheres. Subacute infarction of the left thalamus as seen previously. Punctate acute/subacute infarction in the right thalamus. Numerous acute/subacute infarctions seen affecting both cerebral hemispheres in a front to back distribution typical of watershed infarctions. No large  confluent infarction. No evidence of hemorrhage or swelling. No hydrocephalus or extra-axial collection. Vascular: Major vessels at the base of the brain show flow. Skull and upper cervical spine: Negative Sinuses/Orbits: Clear/normal Other: Sebaceous cyst of the right frontal scalp. Bilateral mastoid effusions. MRA HEAD FINDINGS Both internal carotid arteries are widely patent into the brain. No siphon stenosis. The anterior and middle cerebral vessels are patent without proximal stenosis, aneurysm or vascular malformation. Both vertebral arteries are widely patent to the basilar. No basilar stenosis. Posterior circulation branch vessels appear normal. MRA NECK FINDINGS Both common carotid arteries are widely patent to the bifurcation. Both carotid bifurcations appear widely patent. Vertebral artery origins are poorly seen using this technique, but the vessels appear normal beyond the origins, through the cervical region to the basilar. IMPRESSION: Multiple acute/subacute infarctions noted within the cerebellum, thalami (left larger than right, and both cerebral hemispheres running from front to back in a watershed distribution. Most of these were probably acute on the study of 12/16/2020. I cannot establish any definite new insult since the previous CT exam. MR angiography of the neck vessels without contrast does not show any abnormal finding in the neck. Detail is somewhat limited without contrast enhanced technique. MR angiography of the intracranial vessels is normal. Electronically Signed   By: Nelson Chimes M.D.   On: 12/22/2020 18:35   MR ANGIO NECK WO CONTRAST  Result Date: 12/22/2020 CLINICAL DATA:  Stroke, follow-up. Subacute left thalamic infarction. Parietal and occipital infarctions. EXAM: MRI HEAD WITHOUT CONTRAST MRA HEAD WITHOUT CONTRAST MRA NECK WITHOUT CONTRAST TECHNIQUE: Multiplanar, multiecho pulse sequences of the brain and surrounding structures were obtained without intravenous contrast.  Angiographic images of the Circle of Willis were obtained using MRA technique without intravenous contrast. Angiographic images of the neck were obtained using MRA technique without intravenous contrast. Carotid stenosis measurements (when applicable) are obtained utilizing NASCET criteria, using the distal internal carotid diameter as the denominator. COMPARISON:  CT 12/18/2020.  MRI 09/11/2020. FINDINGS: MRI HEAD FINDINGS Brain: Diffusion imaging shows subacute infarctions within the cerebellar hemispheres. Subacute infarction of the left thalamus as seen previously. Punctate acute/subacute infarction in the right thalamus. Numerous acute/subacute infarctions seen affecting both cerebral hemispheres in a front to back distribution typical of watershed infarctions. No large confluent infarction. No evidence of hemorrhage or swelling. No hydrocephalus or extra-axial collection. Vascular: Major vessels at the base of the brain show flow. Skull and upper cervical spine: Negative Sinuses/Orbits: Clear/normal Other: Sebaceous cyst of the right frontal scalp. Bilateral mastoid effusions. MRA HEAD FINDINGS Both internal carotid arteries are widely patent into the brain. No siphon stenosis. The anterior and middle cerebral vessels are patent without proximal stenosis, aneurysm or vascular malformation. Both vertebral arteries are widely patent to the basilar. No basilar stenosis. Posterior circulation branch vessels appear normal. MRA NECK FINDINGS Both common carotid arteries are widely patent to the bifurcation. Both carotid bifurcations appear widely patent. Vertebral artery origins are poorly seen using this technique, but the vessels appear normal beyond the origins, through the cervical region to the basilar. IMPRESSION: Multiple acute/subacute infarctions noted within the cerebellum, thalami (left larger than right, and both cerebral hemispheres running from front to back in a watershed distribution. Most of these  were probably acute on  the study of 12/16/2020. I cannot establish any definite new insult since the previous CT exam. MR angiography of the neck vessels without contrast does not show any abnormal finding in the neck. Detail is somewhat limited without contrast enhanced technique. MR angiography of the intracranial vessels is normal. Electronically Signed   By: Nelson Chimes M.D.   On: 12/22/2020 18:35   MR BRAIN WO CONTRAST  Result Date: 12/22/2020 CLINICAL DATA:  Stroke, follow-up. Subacute left thalamic infarction. Parietal and occipital infarctions. EXAM: MRI HEAD WITHOUT CONTRAST MRA HEAD WITHOUT CONTRAST MRA NECK WITHOUT CONTRAST TECHNIQUE: Multiplanar, multiecho pulse sequences of the brain and surrounding structures were obtained without intravenous contrast. Angiographic images of the Circle of Willis were obtained using MRA technique without intravenous contrast. Angiographic images of the neck were obtained using MRA technique without intravenous contrast. Carotid stenosis measurements (when applicable) are obtained utilizing NASCET criteria, using the distal internal carotid diameter as the denominator. COMPARISON:  CT 12/18/2020.  MRI 09/11/2020. FINDINGS: MRI HEAD FINDINGS Brain: Diffusion imaging shows subacute infarctions within the cerebellar hemispheres. Subacute infarction of the left thalamus as seen previously. Punctate acute/subacute infarction in the right thalamus. Numerous acute/subacute infarctions seen affecting both cerebral hemispheres in a front to back distribution typical of watershed infarctions. No large confluent infarction. No evidence of hemorrhage or swelling. No hydrocephalus or extra-axial collection. Vascular: Major vessels at the base of the brain show flow. Skull and upper cervical spine: Negative Sinuses/Orbits: Clear/normal Other: Sebaceous cyst of the right frontal scalp. Bilateral mastoid effusions. MRA HEAD FINDINGS Both internal carotid arteries are widely patent  into the brain. No siphon stenosis. The anterior and middle cerebral vessels are patent without proximal stenosis, aneurysm or vascular malformation. Both vertebral arteries are widely patent to the basilar. No basilar stenosis. Posterior circulation branch vessels appear normal. MRA NECK FINDINGS Both common carotid arteries are widely patent to the bifurcation. Both carotid bifurcations appear widely patent. Vertebral artery origins are poorly seen using this technique, but the vessels appear normal beyond the origins, through the cervical region to the basilar. IMPRESSION: Multiple acute/subacute infarctions noted within the cerebellum, thalami (left larger than right, and both cerebral hemispheres running from front to back in a watershed distribution. Most of these were probably acute on the study of 12/16/2020. I cannot establish any definite new insult since the previous CT exam. MR angiography of the neck vessels without contrast does not show any abnormal finding in the neck. Detail is somewhat limited without contrast enhanced technique. MR angiography of the intracranial vessels is normal. Electronically Signed   By: Nelson Chimes M.D.   On: 12/22/2020 18:35   IR Fluoro Guide CV Line Right  Result Date: 12/21/2020 INDICATION: Need for long-term dialysis EXAM: TUNNELED CENTRAL VENOUS HEMODIALYSIS CATHETER PLACEMENT WITH ULTRASOUND AND FLUOROSCOPIC GUIDANCE MEDICATIONS: Cefazolin 2 g. The antibiotic was given in an appropriate time interval prior to skin puncture. ANESTHESIA/SEDATION: Moderate (conscious) sedation was employed during this procedure. A total of Versed 1 mg and Fentanyl 0 mcg was administered intravenously. Moderate Sedation Time: 21 minutes. The patient's level of consciousness and vital signs were monitored continuously by radiology nursing throughout the procedure under my direct supervision. FLUOROSCOPY TIME:  Fluoroscopy Time: 0 minutes 12 seconds (4 mGy). COMPLICATIONS: None  immediate. PROCEDURE: Informed written consent was obtained from patient's family after a discussion of the risks, benefits, and alternatives to treatment. Questions regarding the procedure were encouraged and answered. The patient was placed supine on the exam table. The right neck  and chest was prepped and draped in the standard sterile fashion. Ultrasound was used to evaluate the right internal jugular vein, which found to be patent and suitable for access. An ultrasound image was saved. Using ultrasound guidance, the right internal jugular vein was rec punctured using a 21 gauge micropuncture set. An 018 wire was advanced into the SVC, followed by serial tract dilation and placement of an 035 wire which was advanced into the IVC. Attention was then turned to an infraclavicular location, or a small dermatotomy was made after the administration of additional local analgesia. From this location, a 19 cm tip to cuff cuffed hemodialysis catheter was tunneled to the venotomy site. A peel-away sheath was then advanced over the access wire. Through this peel-away sheath, the hemodialysis catheter was advanced into the central veins, assess the tip was positioned near the superior cavoatrial junction. The line was found to flush and aspirate appropriately. It was locked with the appropriate volume of heparinized saline. It was sutured to the skin using 0 silk suture, and the venotomy was closed with Dermabond. A sterile dressing was placed. The patient tolerated the procedure well without immediate complication. IMPRESSION: Successful placement of 19 cm tip to cuff tunneled hemodialysis catheter via the right internal jugular vein with tips terminating within the superior aspect of the right atrium. The catheter is ready for immediate use. Electronically Signed   By: Albin Felling MD   On: 12/21/2020 10:39   IR US Guide Vasc Access Right  Result Date: 12/21/2020 INDICATION: Need for long-term dialysis EXAM: TUNNELED  CENTRAL VENOUS HEMODIALYSIS CATHETER PLACEMENT WITH ULTRASOUND AND FLUOROSCOPIC GUIDANCE MEDICATIONS: Cefazolin 2 g. The antibiotic was given in an appropriate time interval prior to skin puncture. ANESTHESIA/SEDATION: Moderate (conscious) sedation was employed during this procedure. A total of Versed 1 mg and Fentanyl 0 mcg was administered intravenously. Moderate Sedation Time: 21 minutes. The patient's level of consciousness and vital signs were monitored continuously by radiology nursing throughout the procedure under my direct supervision. FLUOROSCOPY TIME:  Fluoroscopy Time: 0 minutes 12 seconds (4 mGy). COMPLICATIONS: None immediate. PROCEDURE: Informed written consent was obtained from patient's family after a discussion of the risks, benefits, and alternatives to treatment. Questions regarding the procedure were encouraged and answered. The patient was placed supine on the exam table. The right neck and chest was prepped and draped in the standard sterile fashion. Ultrasound was used to evaluate the right internal jugular vein, which found to be patent and suitable for access. An ultrasound image was saved. Using ultrasound guidance, the right internal jugular vein was rec punctured using a 21 gauge micropuncture set. An 018 wire was advanced into the SVC, followed by serial tract dilation and placement of an 035 wire which was advanced into the IVC. Attention was then turned to an infraclavicular location, or a small dermatotomy was made after the administration of additional local analgesia. From this location, a 19 cm tip to cuff cuffed hemodialysis catheter was tunneled to the venotomy site. A peel-away sheath was then advanced over the access wire. Through this peel-away sheath, the hemodialysis catheter was advanced into the central veins, assess the tip was positioned near the superior cavoatrial junction. The line was found to flush and aspirate appropriately. It was locked with the appropriate  volume of heparinized saline. It was sutured to the skin using 0 silk suture, and the venotomy was closed with Dermabond. A sterile dressing was placed. The patient tolerated the procedure well without immediate complication. IMPRESSION: Successful  placement of 19 cm tip to cuff tunneled hemodialysis catheter via the right internal jugular vein with tips terminating within the superior aspect of the right atrium. The catheter is ready for immediate use. Electronically Signed   By: Albin Felling MD   On: 12/21/2020 10:39   DG Abd Portable 1V  Result Date: 12/21/2020 CLINICAL DATA:  Orogastric tube placement. EXAM: PORTABLE ABDOMEN - 1 VIEW COMPARISON:  December 20, 2020. FINDINGS: The bowel gas pattern is normal. Distal tip of enteric tube is seen in expected position of distal stomach. No radio-opaque calculi or other significant radiographic abnormality are seen. IMPRESSION: Distal tip of enteric tube seen in expected position of distal stomach. Electronically Signed   By: Marijo Conception M.D.   On: 12/21/2020 13:56    Labs: BMET Recent Labs  Lab 12/20/20 0339 12/20/20 1601 12/21/20 0517 12/21/20 1600 12/22/20 0521 12/22/20 1600 12/23/20 0526  NA 137 136 134* 135 137 136 138  K 4.4 4.9 4.6 5.1 4.9 5.1 5.7*  CL 103 103 101 102 101 103 105  CO2 _0 GLUCOSE 145* 131* 121* 107* 135* 133* 118*  BUN 33* 36* 48* 63* 81* 94* 109*  CREATININE 2.67* 2.47* 3.06* 4.18* 4.95* 5.90* 6.57*  CALCIUM 8.3* 8.5* 8.5* 8.6* 8.9 8.7* 8.8*  PHOS 2.2* 2.6 2.5 3.7 3.2 3.6 4.5   CBC Recent Labs  Lab 12/19/20 0518 12/19/20 1731 12/20/20 0339 12/21/20 0517 12/22/20 0521 12/23/20 0526  WBC 7.1   < > 9.5 13.4* 12.7* 11.9*  NEUTROABS 5.2  --   --   --   --   --   HGB 7.2*   < > 9.1* 8.8* 8.0* 7.4*  HCT 21.7*   < > 26.6* 27.0* 24.8* 22.8*  MCV 83.1   < > 83.9 85.2 86.7 86.7  PLT 132*   < > 173 211 243 233   < > = values in this interval not displayed.   Medications:     albuterol  2.5  mg Nebulization Q6H   alteplase  2 mg Intracatheter Once   amLODipine  10 mg Per Tube q AM   vitamin C  250 mg Per Tube Daily   bacitracin   Topical Daily   chlorhexidine gluconate (MEDLINE KIT)  15 mL Mouth Rinse BID   Chlorhexidine Gluconate Cloth  6 each Topical Daily   cholecalciferol  2,000 Units Per Tube BID   ciprofloxacin  1 drop Left Eye Q4H while awake   docusate  100 mg Per Tube BID   folic acid  1 mg Per Tube Daily   gabapentin  300 mg Per Tube Daily   lamoTRIgine  100 mg Per Tube BID   levETIRAcetam  500 mg Per Tube BID   [START ON 12/25/2020] levETIRAcetam  500 mg Per Tube Q T,Th,Sat-1800   mouth rinse  15 mL Mouth Rinse 10 times per day   pantoprazole sodium  40 mg Per Tube QHS   polyethylene glycol  17 g Per Tube Daily   QUEtiapine  200 mg Per Tube QHS   sertraline  100 mg Per Tube QPC breakfast   sodium chloride flush  3 mL Intravenous Q12H   [START ON 12/25/2020] thiamine injection  100 mg Intravenous Daily   vitamin B-12  1,000 mcg Oral Daily   zinc sulfate  220 mg Per Tube Daily   Claudia Desanctis, MD 12/23/2020, 9:17 AM

## 2020-12-23 NOTE — Progress Notes (Signed)
ANTICOAGULATION CONSULT NOTE - Follow Up Consult  Pharmacy Consult for Heparin Indication: RLE + L IJ DVTs.  No Known Allergies  Patient Measurements: Height: 6' (182.9 cm) Weight: 107.9 kg (237 lb 14 oz) IBW/kg (Calculated) : 77.6 Heparin Dosing Weight: 100.3 kg  Vital Signs: Temp: 99.6 F (37.6 C) (08/14 0340) Temp Source: Oral (08/14 0340) BP: 108/57 (08/14 0754) Pulse Rate: 74 (08/14 0600)  Labs: Recent Labs    12/21/20 0517 12/21/20 0613 12/22/20 0521 12/22/20 1427 12/22/20 1600 12/23/20 0000 12/23/20 0526  HGB 8.8*  --  8.0*  --   --   --  7.4*  HCT 27.0*  --  24.8*  --   --   --  22.8*  PLT 211  --  243  --   --   --  233  APTT  --    < > 34 41*  --  54* 50*  CREATININE 3.06*   < > 4.95*  --  5.90*  --  6.57*   < > = values in this interval not displayed.    Estimated Creatinine Clearance: 15.5 mL/min (A) (by C-G formula based on SCr of 6.57 mg/dL (H)).   Assessment:  Anticoag: Hep gtt for RLE + L IJ DVTs. -Bleeding from back wound incision sit now stopped. NSGY aware and okay with continuing heparin, Now with chest hematoma noted by CCM on 8/12. -S/p IVC filter  - aPTT 50 low end of goal. Hgb down to 7.4. Plts WNL 233  Goal of Therapy:  aPTT 50-75 seconds Monitor platelets by anticoagulation protocol: Yes   Plan:  Cont heparin 2600 units/hr Monitor closely for any s/s of bleeding near incision site.  Daily aPTT, CBC Keppra dose adjusted for ESRD.   Ericka Marcellus S. Alford Highland, PharmD, BCPS Clinical Staff Pharmacist [email or web address] Alford Highland, Martie Fulgham Stillinger 12/23/2020,8:00 AM

## 2020-12-24 ENCOUNTER — Inpatient Hospital Stay (HOSPITAL_COMMUNITY): Payer: No Typology Code available for payment source

## 2020-12-24 DIAGNOSIS — J9601 Acute respiratory failure with hypoxia: Secondary | ICD-10-CM | POA: Diagnosis not present

## 2020-12-24 DIAGNOSIS — J9602 Acute respiratory failure with hypercapnia: Secondary | ICD-10-CM | POA: Diagnosis not present

## 2020-12-24 DIAGNOSIS — R410 Disorientation, unspecified: Secondary | ICD-10-CM | POA: Diagnosis not present

## 2020-12-24 LAB — RENAL FUNCTION PANEL
Albumin: 1.7 g/dL — ABNORMAL LOW (ref 3.5–5.0)
Albumin: 1.9 g/dL — ABNORMAL LOW (ref 3.5–5.0)
Anion gap: 9 (ref 5–15)
Anion gap: 12 (ref 5–15)
BUN: 92 mg/dL — ABNORMAL HIGH (ref 6–20)
BUN: 102 mg/dL — ABNORMAL HIGH (ref 6–20)
CO2: 25 mmol/L (ref 22–32)
CO2: 23 mmol/L (ref 22–32)
Calcium: 8.7 mg/dL — ABNORMAL LOW (ref 8.9–10.3)
Calcium: 9.2 mg/dL (ref 8.9–10.3)
Chloride: 103 mmol/L (ref 98–111)
Chloride: 102 mmol/L (ref 98–111)
Creatinine, Ser: 5.75 mg/dL — ABNORMAL HIGH (ref 0.61–1.24)
Creatinine, Ser: 6.39 mg/dL — ABNORMAL HIGH (ref 0.61–1.24)
GFR, Estimated: 11 mL/min — ABNORMAL LOW (ref >60)
GFR, Estimated: 9 mL/min — ABNORMAL LOW (ref >60)
Glucose, Bld: 129 mg/dL — ABNORMAL HIGH (ref 70–99)
Glucose, Bld: 125 mg/dL — ABNORMAL HIGH (ref 70–99)
Phosphorus: 4.3 mg/dL (ref 2.5–4.6)
Phosphorus: 5.3 mg/dL — ABNORMAL HIGH (ref 2.5–4.6)
Potassium: 4.9 mmol/L (ref 3.5–5.1)
Potassium: 5.6 mmol/L — ABNORMAL HIGH (ref 3.5–5.1)
Sodium: 137 mmol/L (ref 135–145)
Sodium: 137 mmol/L (ref 135–145)

## 2020-12-24 LAB — GLUCOSE, CAPILLARY
Glucose-Capillary: 116 mg/dL — ABNORMAL HIGH (ref 70–99)
Glucose-Capillary: 117 mg/dL — ABNORMAL HIGH (ref 70–99)
Glucose-Capillary: 117 mg/dL — ABNORMAL HIGH (ref 70–99)
Glucose-Capillary: 118 mg/dL — ABNORMAL HIGH (ref 70–99)
Glucose-Capillary: 122 mg/dL — ABNORMAL HIGH (ref 70–99)
Glucose-Capillary: 122 mg/dL — ABNORMAL HIGH (ref 70–99)
Glucose-Capillary: 142 mg/dL — ABNORMAL HIGH (ref 70–99)

## 2020-12-24 LAB — POCT I-STAT 7, (LYTES, BLD GAS, ICA,H+H)
Acid-base deficit: 1 mmol/L (ref 0.0–2.0)
Bicarbonate: 25.1 mmol/L (ref 20.0–28.0)
Calcium, Ion: 1.19 mmol/L (ref 1.15–1.40)
HCT: 31 % — ABNORMAL LOW (ref 39.0–52.0)
Hemoglobin: 10.5 g/dL — ABNORMAL LOW (ref 13.0–17.0)
O2 Saturation: 98 %
Patient temperature: 98.6
Potassium: 5.4 mmol/L — ABNORMAL HIGH (ref 3.5–5.1)
Sodium: 137 mmol/L (ref 135–145)
TCO2: 26 mmol/L (ref 22–32)
pCO2 arterial: 44.3 mmHg (ref 32.0–48.0)
pH, Arterial: 7.361 (ref 7.350–7.450)
pO2, Arterial: 107 mmHg (ref 83.0–108.0)

## 2020-12-24 LAB — CBC
HCT: 21.9 % — ABNORMAL LOW (ref 39.0–52.0)
Hemoglobin: 7 g/dL — ABNORMAL LOW (ref 13.0–17.0)
MCH: 28.1 pg (ref 26.0–34.0)
MCHC: 32 g/dL (ref 30.0–36.0)
MCV: 88 fL (ref 80.0–100.0)
Platelets: 195 10*3/uL (ref 150–400)
RBC: 2.49 MIL/uL — ABNORMAL LOW (ref 4.22–5.81)
RDW: 17.2 % — ABNORMAL HIGH (ref 11.5–15.5)
WBC: 10.3 10*3/uL (ref 4.0–10.5)
nRBC: 0 % (ref 0.0–0.2)

## 2020-12-24 LAB — APTT
aPTT: 48 seconds — ABNORMAL HIGH (ref 24–36)
aPTT: 38 seconds — ABNORMAL HIGH (ref 24–36)

## 2020-12-24 LAB — MAGNESIUM: Magnesium: 2.7 mg/dL — ABNORMAL HIGH (ref 1.7–2.4)

## 2020-12-24 MED ORDER — CYCLOBENZAPRINE HCL 10 MG PO TABS
10.0000 mg | ORAL_TABLET | Freq: Three times a day (TID) | ORAL | Status: DC | PRN
  Administered 2020-12-28 – 2021-01-07 (×7): 10 mg
  Filled 2020-12-24 (×8): qty 1

## 2020-12-24 MED ORDER — MIDAZOLAM HCL 2 MG/2ML IJ SOLN
INTRAMUSCULAR | Status: AC
  Administered 2020-12-24: 2 mg
  Filled 2020-12-24: qty 2

## 2020-12-24 MED ORDER — MIDAZOLAM HCL 2 MG/2ML IJ SOLN: 2.0000 mg | Freq: Once | INTRAMUSCULAR | Status: AC

## 2020-12-24 MED ORDER — KETAMINE HCL 50 MG/5ML IJ SOSY
PREFILLED_SYRINGE | INTRAMUSCULAR | Status: AC
  Filled 2020-12-24: qty 5

## 2020-12-24 MED ORDER — ASPIRIN EC 81 MG PO TBEC: 81.0000 mg | DELAYED_RELEASE_TABLET | Freq: Every day | ORAL | Status: DC

## 2020-12-24 MED ORDER — ONDANSETRON HCL 4 MG/2ML IJ SOLN: 4.0000 mg | Freq: Four times a day (QID) | INTRAMUSCULAR | Status: DC | PRN

## 2020-12-24 MED ORDER — HEPARIN (PORCINE) 25000 UT/250ML-% IV SOLN
2800.0000 [IU]/h | INTRAVENOUS | Status: DC
  Administered 2020-12-24 (×2): 2700 [IU]/h via INTRAVENOUS
  Administered 2020-12-25: 2800 [IU]/h via INTRAVENOUS
  Filled 2020-12-24 (×4): qty 250

## 2020-12-24 MED ORDER — ETOMIDATE 2 MG/ML IV SOLN
INTRAVENOUS | Status: AC
  Filled 2020-12-24: qty 20

## 2020-12-24 MED ORDER — FENTANYL CITRATE (PF) 100 MCG/2ML IJ SOLN
200.0000 ug | Freq: Once | INTRAMUSCULAR | Status: AC
  Administered 2020-12-25: 200 ug via INTRAVENOUS
  Filled 2020-12-24: qty 4

## 2020-12-24 MED ORDER — VECURONIUM BROMIDE 10 MG IV SOLR
10.0000 mg | Freq: Once | INTRAVENOUS | Status: AC
  Administered 2020-12-25: 10 mg via INTRAVENOUS
  Filled 2020-12-24: qty 10

## 2020-12-24 MED ORDER — FENTANYL CITRATE (PF) 100 MCG/2ML IJ SOLN
100.0000 ug | Freq: Once | INTRAMUSCULAR | Status: AC
  Administered 2020-12-24: 100 ug via INTRAVENOUS

## 2020-12-24 MED ORDER — FENTANYL CITRATE (PF) 100 MCG/2ML IJ SOLN
INTRAMUSCULAR | Status: AC
  Filled 2020-12-24: qty 2

## 2020-12-24 MED ORDER — ROCURONIUM BROMIDE 10 MG/ML (PF) SYRINGE
PREFILLED_SYRINGE | INTRAVENOUS | Status: AC
  Filled 2020-12-24: qty 10

## 2020-12-24 MED ORDER — ETOMIDATE 2 MG/ML IV SOLN
20.0000 mg | Freq: Once | INTRAVENOUS | Status: AC
  Administered 2020-12-25: 20 mg via INTRAVENOUS
  Filled 2020-12-24: qty 10

## 2020-12-24 MED ORDER — ROCURONIUM BROMIDE 50 MG/5ML IV SOLN
100.0000 mg | Freq: Once | INTRAVENOUS | Status: AC
  Administered 2020-12-24: 100 mg via INTRAVENOUS
  Filled 2020-12-24: qty 10

## 2020-12-24 MED ORDER — MIDAZOLAM HCL 2 MG/2ML IJ SOLN
5.0000 mg | Freq: Once | INTRAMUSCULAR | Status: AC
  Administered 2020-12-25: 5 mg via INTRAVENOUS
  Filled 2020-12-24: qty 6

## 2020-12-24 MED ORDER — ETOMIDATE 2 MG/ML IV SOLN
20.0000 mg | Freq: Once | INTRAVENOUS | Status: AC
  Administered 2020-12-24: 20 mg via INTRAVENOUS

## 2020-12-24 MED ORDER — RACEPINEPHRINE HCL 2.25 % IN NEBU
INHALATION_SOLUTION | RESPIRATORY_TRACT | Status: AC
  Administered 2020-12-24: 0.5 mL
  Filled 2020-12-24: qty 0.5

## 2020-12-24 MED ORDER — ASPIRIN 81 MG PO CHEW
81.0000 mg | CHEWABLE_TABLET | Freq: Every day | ORAL | Status: DC
  Administered 2020-12-24 – 2021-01-08 (×16): 81 mg
  Filled 2020-12-24 (×16): qty 1

## 2020-12-24 MED ORDER — ONDANSETRON HCL 4 MG PO TABS: 4.0000 mg | ORAL_TABLET | Freq: Four times a day (QID) | ORAL | Status: DC | PRN

## 2020-12-24 MED ORDER — DEXAMETHASONE SODIUM PHOSPHATE 10 MG/ML IJ SOLN
10.0000 mg | Freq: Once | INTRAMUSCULAR | Status: AC
  Administered 2020-12-24: 10 mg via INTRAVENOUS
  Filled 2020-12-24: qty 1

## 2020-12-24 NOTE — Procedures (Signed)
Extubation Procedure Note  Patient Details:   Name: Keith Dixon DOB: [birth year] MRN: [id number]   Airway Documentation:    Vent end date: 12/24/20 Vent end time: 1020   Evaluation  O2 sats: stable throughout Complications: Complications of stridor Patient did tolerate procedure well. Bilateral Breath Sounds: Stridor   Yes  Patient extubated per MD order.  Positive cuff leak was noted however patient did note to have stridor once extubated.  Originally patient was placed on 2L [place] however due to increased work of breathing placed patient on 8L [place].  Racemic epi given x1 and orders for steroids to be given.  Patient's sats and vitals are stable otherwise.  Will continue to monitor.   Judith Part 12/24/2020, 10:34 AM

## 2020-12-24 NOTE — Progress Notes (Addendum)
[name] for heparin Indication: DVT  No Known Allergies  Patient Measurements: Height: 6' (182.9 cm) Weight: 103.3 kg (227 lb 11.8 oz) IBW/kg (Calculated) : 77.6  Vital Signs: Temp: 98.6 F (37 C) (08/15 0328) Temp Source: Axillary (08/15 0328) BP: 93/49 (08/15 0600) Pulse Rate: 72 (08/15 0600)  Labs: Recent Labs    12/22/20 0521 12/22/20 1427 12/23/20 0000 12/23/20 0526 12/23/20 1601 12/24/20 0345  HGB 8.0*  --   --  7.4*  --  7.0*  HCT 24.8*  --   --  22.8*  --  21.9*  PLT 243  --   --  233  --  195  APTT 34   < > 54* 50*  --  48*  CREATININE 4.95*   < >  --  6.57* 6.92* 5.75*   < > = values in this interval not displayed.     Estimated Creatinine Clearance: 17.4 mL/min (A) (by C-G formula based on SCr of 5.75 mg/dL (H)).  Assessment: 60 yo male with RLE DVT s/p recent lumbar fusion, for heparin surgery.  Patient continues on IV heparin and transitioned to iHD per nephro on 8/14.   8/15 aPTT 48 sec on 2600 units/hr.  No documented signs/symptoms of bleeding Chest hematoma stable at this time Nurse didn't report in pauses in therapy.  Goal of Therapy:  aPTT 50-75 seconds.  Monitor platelets by anticoagulation protocol: Yes   Plan:  Increase heparin to 2700 units/hr Plan to obtain repeat aPTT at 1600 to guide further dosing Monitor closely for any s/s of bleeding near incision site.  F/u with Neurosurgery recs for dose escalation  **Patient re-intubated emergently 8/15**  Keith Dixon E Keith Dixon 12/24/2020 7:35 AM

## 2020-12-24 NOTE — Progress Notes (Signed)
Patient ID: Keith Dixon, male   DOB: July 13, 1960, 60 y.o.   MRN: [id number] STROKE TEAM PROGRESS NOTE   INTERVAL HISTORY Patient just extubated this a.m. and is sitting up in bed.  He is drowsy but can be easily aroused.  He follows simple commands.  His voice is hoarse and can be understood with some difficulty.  He has good antigravity strength on the left.  He has right hemiparesis.  Eyes are disconjugate with left eye exotropia he can write on hypotrophic  Vitals:   12/24/20 0857 12/24/20 1030 12/24/20 1139 12/24/20 1147  BP: (!) 113/58     Pulse: (!) 121     Resp: (!) 36     Temp:      TempSrc:      SpO2: 98% 97% 95% 93%  Weight:      Height:       CBC:  Recent Labs  Lab 12/19/20 0518 12/19/20 1731 12/23/20 0526 12/24/20 0345  WBC 7.1   < > 11.9* 10.3  NEUTROABS 5.2  --   --   --   HGB 7.2*   < > 7.4* 7.0*  HCT 21.7*   < > 22.8* 21.9*  MCV 83.1   < > 86.7 88.0  PLT 132*   < > 233 195   < > = values in this interval not displayed.   Basic Metabolic Panel:  Recent Labs  Lab 12/23/20 0526 12/23/20 1601 12/23/20 2023 12/24/20 0345  NA 138 138  --  137  K 5.7* 5.9* 4.8 4.9  CL 105 105  --  103  CO2 23 23  --  25  GLUCOSE 118* 123*  --  129*  BUN 109* 119*  --  92*  CREATININE 6.57* 6.92*  --  5.75*  CALCIUM 8.8* 9.1  --  8.7*  MG 2.9*  --   --  2.7*  PHOS 4.5 5.0*  --  4.3   Lipid Panel:  Recent Labs  Lab 12/20/20 0339  TRIG 213*   HgbA1c:  No results for input(s): HGBA1C in the last 168 hours.   IMAGING past 24 hours DG CHEST PORT 1 VIEW  Result Date: 12/24/2020 CLINICAL DATA:  Intubation EXAM: PORTABLE CHEST 1 VIEW COMPARISON:  Portable exam 1227 hours compared to 12/24/2020 at 0521 hours FINDINGS: Tip of endotracheal tube approximately 13 mm above carina. Feeding tube extends into stomach. Two RIGHT jugular central venous catheters with tips projecting over SVC. Enlargement of cardiac silhouette with persistent elevation of RIGHT diaphragm. Decreased  lung volumes with bibasilar atelectasis and probable LEFT perihilar infiltrate. No definite pleural effusion or pneumothorax. Prior thoracolumbar fusion. IMPRESSION: Decreased lung volumes greater on RIGHT with bibasilar atelectasis and suspected LEFT perihilar infiltrate. Electronically Signed   By: Lavonia Dana M.D.   On: 12/24/2020 13:20   DG Chest Port 1 View  Result Date: 12/24/2020 CLINICAL DATA:  Intubation, acute respiratory failure with hypoxia, history hypertension, smoker EXAM: PORTABLE CHEST 1 VIEW COMPARISON:  Portable exam 0521 hours compared to 12/19/2020 FINDINGS: Tip of endotracheal tube projects 2.5 cm above carina. Feeding tube extends into stomach. Two RIGHT jugular catheters with tips projecting over cavoatrial junction region. Stable heart size and mediastinal contours. Low lung volumes with bibasilar atelectasis. Mild atelectasis versus infiltrate LEFT upper lobe. No pleural effusion or pneumothorax. IMPRESSION: Bibasilar atelectasis with additional mild atelectasis versus infiltrate LEFT upper lobe. Electronically Signed   By: Lavonia Dana M.D.   On: 12/24/2020 08:26  Physical Exam   Temp:  [98.2 F (36.8 C)-99.6 F (37.6 C)] 99.6 F (37.6 C) (08/15 0812) Pulse Rate:  [60-121] 121 (08/15 0857) Resp:  [14-36] 36 (08/15 0857) BP: (93-127)/(48-73) 113/58 (08/15 0857) SpO2:  [90 %-100 %] 93 % (08/15 1147) FiO2 (%):  [40 %-50 %] 50 % (08/15 0857) Weight:  [103.3 kg-108.7 kg] 103.3 kg (08/15 0700)  General -obese middle-aged African-American male mild respiratory distress postextubation Ophthalmologic - fundi not visualized due to noncooperation.  Cardiovascular - Regular rate and rhythm.  Neuro -patient extubated.  Is drowsy but can be aroused easily follows simple one-step midline commands.  Eyes midline, not blinking to visual threat, +doll's eyes. Eyes are disconjugate with skew eye deviation with right high hypotropia Exotropia.  Mild right lower facial weakness  when he smiles.  Tongue protrusion midline.  Spontaneous antigravity movements on the left side.  Right hemiparesis with right upper extremity 2/5 and right lower extremity 2-3/5 strength  DTR diminished and no babinski. Sensation, coordination and gait not tested.     Assessment and Plan  Keith Dixon is a 60 y.o. male with PMH significant for PTSD, OSA, HTN, tobacco use, ?seizures, bipolar, degenerativse scoliosis who underwent extensive T10-pelvis decompression and fusion over 2 days(8/4 and 8/5) and difficulty waking up after surgery. Also complicated by Aki, rhabdomyolysis, DVT in RLE and left IJ s/p IVC filter and poor mentation/encephalopathy. Workup with CTH demonstrated embolic appearing strokes and neurology consulted.  #Stroke: bilateral cortical MCA/ACA watershed cortical as well as subcortical left thalamic infarcts, concerning for perioperative hypotension in the setting of intracranial stenosis. Can not rule out cardioembolic source CT head Small recent appearing bilateral cortical infarcts, left thalamic and probable bilateral cerebellar infarcts.  MRI  unable d/t hardware and CRRT with multiple lines of meds Carotid Doppler w/LICA [id number] % stenosis, RICA not able to sonicate due to central line CTA Head on 09/11/20 with no significant intracranial stenosis MRI brain watershed infarcts in cerebellum, thalami. MRA head and neck Neg. 2D Echo w/EF 65 to 70 %, LA normal in size  BLE doppler showed right LE DVT TCD with Bubble was negative for PFO  TCD  mild elevated velocity of left MCA, right MCA and right ICA siphon, mild  stenosis in these vessels. LDL 30 HgbA1c 5.8 VTE prophylaxis - heparin IV No antithrombotic prior to admission, now on heparin IV given DVT. Continue heparin IV now given possible trach soon Therapy recommendations:  pending  #Seizure History  LTM this admission did not show any seizures or epileptiform discharges.  LTM d/c 8/9 Continue with Lamictal 100  mg BID and Keppra 1000 mg BID.   DVT LE venous doppler showed right LE DVT Also left IJ thrombosis found during line placement S/p IVC filter Now on heparin IV  AKI with renal failure Cre 6.74->4.91->4.12->3.37->2.83->2.67->3.06 Was on CRRT -> off now Placed HD catheter  Likely HD tomorrow Nephrology on board  Severe anemia Hb 7.5->6.1-> PRBC->7.2->9.3->9.1->8.8 Continue CBC monitoring Continue heparin IV for now  Extensive back surgery Dr. Marcello Moores on board Status post surgery 8/4 and 8/5 Drainage removed 8/9  Respiratory failure Intubated on vent Off propofol Still on fentanyl CCM on board May need tracheostomy - continue heparin IV for now  #Hypertension Home meds:  norvasc Stable Long-term BP goal normotensive  Hyperlipidemia Home meds:  none  LDL 30, goal < 70 No statin needed at the time given LDL at goal  Tobacco abuse Current smoker Smoking cessation counseling will be provided  when he is more alert.  Other Stroke Risk Factors ETOH use, alcohol level <10, advised to drink no more than 2 drink(s) a day Substance abuse - UDS:  reported THC use Obesity, Body mass index is 30.89 kg/m., BMI >/= 30 associated with increased stroke risk, recommend weight loss, diet and exercise as appropriate   Other Active Problems B12 deficiency, supplement Hypocalcemia   Hospital day # 12  Patient just got extubated from [place] with LV able to protect his airway and Nikkel intubation.  Long discussion with the patient at the bedside as well as with his girlfriend and daughter about plan of care for questions.  Family wants to be aggressive on full support for now.  May consider TEE to look for cardiac source of embolism patient is stable prolonged cardiac monitoring after discharge for paroxysmal A. fib.  Continue aspirin alone for stroke prevention given significant anemia requiring blood transfusion.  Discussed with Dr. Lynetta Mare critical care medicine. This patient is  critically ill and at significant risk of neurological worsening, death and care requires constant monitoring of vital signs, hemodynamics,respiratory and cardiac monitoring, extensive review of multiple databases, frequent neurological assessment, discussion with family, other specialists and medical decision making of high complexity.I have made any additions or clarifications directly to the above note.This critical care time does not reflect procedure time, or teaching time or supervisory time of PA/NP/Med Resident etc but could involve care discussion time.  I spent 30 minutes of neurocritical care time  in the care of  this patient.      Antony Contras, MD 12/24/2020 1:44 PM   To contact Stroke Continuity provider, please refer to [email or web address] After hours, contact General Neurology

## 2020-12-24 NOTE — Progress Notes (Signed)
MD, RT and charge RN called to room for increased work of breathing and audible stridor.   IV steroid and R-Epi Neb given, little to no improvement.  Family in room, all questions addressed at this time, they are aware of plan of care going forward.   MD & PA to room for Emergent Intubation  181mg fent '2mg'$  versed 10 Etom  60 Rocc

## 2020-12-24 NOTE — Procedures (Signed)
Intubation Procedure Note  Keith Dixon  [id number]  [birth year]  Date:12/24/20  Time:1:53 PM   Provider Performing:Keith Dixon    Procedure: Intubation (31500)  Indication(s) Respiratory Failure  Consent Risks of the procedure as well as the alternatives and risks of each were explained to the patient and/or caregiver.  Consent for the procedure was obtained and is signed in the bedside chart   Anesthesia Etomidate, Versed, Fentanyl, and Rocuronium   Time Out Verified patient identification, verified procedure, site/side was marked, verified correct patient position, special equipment/implants available, medications/allergies/relevant history reviewed, required imaging and test results available.   Sterile Technique Usual hand hygeine, masks, and gloves were used   Procedure Description Patient positioned in bed supine.  Sedation given as noted above.  Patient was intubated with endotracheal tube using Glidescope.  View was Grade 1 full glottis .  Number of attempts was 1.  Colorimetric CO2 detector was consistent with tracheal placement.  Generalized edema of false cords. Ulceration of the posterior aspect of the left cord.   Complications/Tolerance None; patient tolerated the procedure well. Chest X-ray is ordered to verify placement.  Keith Brood, MD [facility] ICU Physician Keith Dixon  Pager: [phone] Or Epic Secure Chat After hours: [phone].  12/24/2020, 2:07 PM

## 2020-12-24 NOTE — Progress Notes (Signed)
[place] for heparin Indication: DVT  No Known Allergies  Patient Measurements: Height: 6' (182.9 cm) Weight: 103.3 kg (227 lb 11.8 oz) IBW/kg (Calculated) : 77.6  Vital Signs: Temp: 99.4 F (37.4 C) (08/15 1528) Temp Source: Axillary (08/15 1528) BP: 103/54 (08/15 1800) Pulse Rate: 80 (08/15 1808)  Labs: Recent Labs    12/22/20 0521 12/22/20 1427 12/23/20 0526 12/23/20 1601 12/24/20 0345 12/24/20 1359 12/24/20 1637  HGB 8.0*  --  7.4*  --  7.0* 10.5*  --   HCT 24.8*  --  22.8*  --  21.9* 31.0*  --   PLT 243  --  233  --  195  --   --   APTT 34   < > 50*  --  48*  --  38*  CREATININE 4.95*   < > 6.57* 6.92* 5.75*  --  6.39*   < > = values in this interval not displayed.     Estimated Creatinine Clearance: 15.7 mL/min (A) (by C-G formula based on SCr of 6.39 mg/dL (H)).  Assessment: 60 yo male with RLE DVT s/p recent lumbar fusion, for heparin surgery.  Patient continues on IV heparin and transitioned to iHD per nephro on 8/14.   8/15 aPTT 38 sec on 2700 units/hr.  No documented signs/symptoms of bleeding Chest hematoma stable at this time  Goal of Therapy:  aPTT 50-75 seconds.  Monitor platelets by anticoagulation protocol: Yes   Plan:  Increase heparin to 2800 units/hr Recheck in 8 hours Qam aPTT and CBC Monitor closely for any s/s of bleeding near incision site.   **Patient re-intubated emergently 8/15**  Alanda Slim, PharmD, [facility] Clinical Pharmacist Please see AMION for all Pharmacists' Contact Phone Numbers 12/24/2020, 6:50 PM

## 2020-12-24 NOTE — Progress Notes (Addendum)
NAME:  Keith Dixon, MRN:  [id number], DOB:  1960/11/10, LOS: 39 ADMISSION DATE:  12/12/2020, CONSULTATION DATE:  12/24/20  REFERRING MD:  Grandville Silos, CHIEF COMPLAINT:   respiratory depression  History of Present Illness:  Keith Dixon is a 60 y/o M with PMH significant for severe degenerative scoliosis with multi-level listhesis, anxiety, bipolar disorder, PTSD, seizures, OSA and tobacco use who was admitted to neurosurgery for L1-2, L2-3, L3-4, L4-5 DLIFs initially and subsequent T10-pelvis decompression and fusion.   His initial surgery was 8/3 and was slow to wake up, after second  surgery 8/4  (10hr procedure) pt was again slow to awaken post-anesthesia, so decision was made to leave patient intubated overnight.    Significant Hospital Events:  8/3 Admit to neurosurgery, underwent  anterior lateral Interbody fusion of L1-L5, notably had difficult foley insertion requiring urethral dilation 8/4 Stage 2 posterior decompression and fusion, left intubated post-procedure and PCCM consulted 8/5 AKI seen on am labs with evidence of volume depletion on POC [place] 8/6: Patient AKI was due to acute rhabdomyolysis in the setting of prolonged surgery, he remained oliguric.  Ultrasound lower extremity showed acute right lower extremity DVT and left IJ DVT, IVC filter placed 8/7: Patient serum creatinine continues to trend up, he remained oliguric, started on CRRT Head CT showed small multiple bilateral strokes.  EEG was placed  8/9>. No seizures per EEG, remains on Levo, Propofol and fentanyl with good vent synchrony. Remains on CVVH with UF goal of 50 cc per hour, but increasing pressor needs this am  8/11: off pressors, mental status precluding extubation 8/12: Tunneled HD line placement 8/13: Tolerating some PSV on fentanyl 8/13 MRI/A brain > multiple acute/subacute CVA in the cerebellum, left > right thalami, bilateral cerebral hemispheres.  No abnormal findings in the neck, normal angiography 8/14  Began HD 8/15 Extubated to HFNC. Given racemic epinephrine and steroids with concern for laryngeal edema.   Interim History / Subjective:  Completed first HD yesterday. Extubated this morning with Sats 96% on 8L HFNC. Racemic epinephrine and decadron given with concern for post extubation laryngeal edema with stridor.  Objective   Blood pressure (!) 113/58, pulse (!) 121, temperature 99.6 F (37.6 C), temperature source Axillary, resp. rate (!) 36, height 6' (1.829 m), weight 103.3 kg, SpO2 97 %.    Vent Mode: PRVC FiO2 (%):  [40 %-50 %] 50 % Set Rate:  [16 bmp] 16 bmp Vt Set:  [620 mL] 620 mL PEEP:  [5 cmH20] 5 cmH20 Pressure Support:  [5 cmH20] 5 cmH20 Plateau Pressure:  [15 cmH20-20 cmH20] 20 cmH20   Intake/Output Summary (Last 24 hours) at 12/24/2020 1034 Last data filed at 12/24/2020 0600 Gross per 24 hour  Intake 2548.41 ml  Output 1300 ml  Net 1248.41 ml    Filed Weights   12/23/20 1845 12/24/20 0454 12/24/20 0700  Weight: 107.7 kg 103.3 kg 103.3 kg   Physical Exam  General: Ill-appearing gentleman, obese, now extubated HEENT: NCAT, pupils equal.   CV: intermittently tachycardic with normal rhythm, no murmurs.  PULM: tachypneic on 8L HFNC s/p extubation. Coarse bilateral breath sounds, no wheezes.  GI: Nondistended, bowel sounds present. Skin: Pressure. dressings on bilateral heels.  Chest wall hematoma smaller Neuro: Eyes open and tracks, follows commands, weak cough.    Assessment & Plan:   Acute post operative hypoxic and hypercapnic respiratory respiratory failure s/p mechanical ventiliation.  Tachypneic s/p extubation with weak cough but sats stable on HFNC. -Start racemic epinephrine and decadron  for c/f larygngeal edema.  - May need to consider tracheostomy if respiratory status declines after extubation. -Off sedation  Acute encephalopathy, agitation Will reassess now extubated and off sedation.  -Continue Seroquel, consider uptitrate  Acute oliguric  renal failure: secondary to rhabdomyolysis and ATN this admission. Electrolytes improved s/p HD.  -Appreciate nephrology management -Intermittent HD as per nephrology plans, completed first HD on 8/14  Transient fever  -Continue to follow WBC, clinical status.  Plan to repeat cultures if fever recurs  Acute right lower extremity DVT, acute left IJ DVT s/p IVC filter placement -Continue Heparin infusion, targeting lower end of therapeutic range until okay to increase per neurosurgery plans  Acute on chronic anemia. S/p total of 6U PRBC transfused 8/4-8/10. -Following CBC  Acute bilateral cortical and subcortical MCA/ACA watershed territory infarcts Ddx post operative hypotension vs embolic source. No embolic source identified--no thrombus on echocardiogram, TCD negative for PFO. Unfortunately, renal failure is precluding CTA for further evaluation for intracranial stenosis.  -Appreciate neurology assistance -Heparin infusion as ordered  Hypertension -Continue amlodipine  Hx of seizure disorder. No epileptic activity on EEG -Continue home Keppra and Lamictal  Lumbar spinal stenosis s/p decompression and fusion -Pain control -Antibiotics completed  High risk malnutrition -Continue tube feeding  Deep pressure injury of left heel. -Continue wound care  Chest hematoma.  Does not appear to be expanding on heparin fusion -Following  Best Practice    Diet/type: NPO, TF DVT prophylaxis: heparin gtt GI prophylaxis: PPI Lines: Right IJ, Left femoral HD line, Right Radial A-line, Right peripheral IV, NG.  Foley: Removed 8/11 Code Status:  full code Last date of multidisciplinary goals of care discussion: 8/11 patient's family was updated at bedside  Independent critical care time 34 minutes  [place] Service II, MS4

## 2020-12-24 NOTE — Progress Notes (Signed)
   Providing Compassionate, Quality Care - Together  NEUROSURGERY PROGRESS NOTE   S: No issues overnight.   O: EXAM:  BP (!) 113/58   Pulse (!) 121   Temp 99.6 F (37.6 C) (Axillary)   Resp (!) 36   Ht 6' (1.829 m)   Wt 103.3 kg   SpO2 98%   BMI 30.89 kg/m   Intubated, sedated  Eyes open, tracking  PERRL  Face symmetric  Moving all extremities equally, following commands x4  ASSESSMENT:  60 y.o. male with   Degenerative Scoliosis with severe stenosis  AKI  VDRF  Acute blood loss anemia, expected   - status post stage I LLIF L1-L5 on 12/12/2020 - status post stage II posterior instrumentation T10-pelvis, L2- 5 PCOs, L5-S1 TLIF on  12/13/2020  PLAN: -  continue supportive care - hemodialysis per Nephrology, planning tomorrow - MRI brain reviewed - wean off event if possible, discuss with the girlfriend at bedside about possible trach /PEG.  We will see how he progresses over the next 24-48 hours, appreciate CCM recommendations - daily dressing changes of thoracolumbar wound, Q2h log rolling - heparin drip for acute DVT, status post IVC filter    Thank you for allowing me to participate in this patient's care.  Please do not hesitate to call with questions or concerns.   Elwin Sleight, [facility] Cell: [phone]

## 2020-12-24 NOTE — Progress Notes (Signed)
RT NOTE: patient placed on CPAP/PSV of 5/5 at 0735.  Currently tolerating well.  Will continue to monitor and wean as tolerated.

## 2020-12-24 NOTE — Progress Notes (Signed)
Patient ID: Keith Dixon, male   DOB: 1960/06/13, 60 y.o.   MRN: [id number] [facility] Progress Note   Assessment/ Plan:   1.  Acute kidney injury: Appears to be from a combination of ATN and rhabdomyolysis/pigment nephropathy.  He has dense renal injury remains oliguric overnight after hemodialysis yesterday; no evidence of renal recovery to date.  He does not have any acute indications for dialysis today and I have ordered for hemodialysis again tomorrow here in the ICU. 2.  Acute postoperative hypoxic/hypercapnic respiratory failure: Intubated and on ventilator support per CCM.  Likely to need need tracheostomy in the next 24 hours. 4.  Lumbar spine stenosis/scoliosis status post decompression and fusion surgery: Ongoing management/surveillance by neurosurgery. 5.  Shock: Secondary to distributive/hypovolemic mechanism.  Improving hemodynamic status. 6.  Acute blood loss anemia: Secondary to surgical associated losses/critical illness, downtrending of hemoglobin and hematocrit noted and may need PRBCs in the next 24 hours. 7.  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  Subjective:   Without acute events overnight-underwent hemodialysis yesterday   Objective:   BP (!) 113/58   Pulse (!) 121   Temp 99.6 F (37.6 C) (Axillary)   Resp (!) 36   Ht 6' (1.829 m)   Wt 103.3 kg   SpO2 98%   BMI 30.89 kg/m   Intake/Output Summary (Last 24 hours) at 12/24/2020 1779 Last data filed at 12/24/2020 0600 Gross per 24 hour  Intake 2677.44 ml  Output 1300 ml  Net 1377.44 ml   Weight change:   Physical Exam: Gen: Intubated, unresponsive, girlfriend ([place]) at bedside CVS: Pulse regular rhythm, tachycardic, S1 and S2 normal Resp: Anteriorly clear to auscultation, no rales/rhonchi.  Right IJ TDC Abd: Soft, obese, nontender, bowel sounds normal Ext: Trace-1+ upper extremity edema, 1+ edema lower extremities  Imaging: MR ANGIO HEAD  WO CONTRAST  Result Date: 12/22/2020 CLINICAL DATA:  Stroke, follow-up. Subacute left thalamic infarction. Parietal and occipital infarctions. EXAM: MRI HEAD WITHOUT CONTRAST MRA HEAD WITHOUT CONTRAST MRA NECK WITHOUT CONTRAST TECHNIQUE: Multiplanar, multiecho pulse sequences of the brain and surrounding structures were obtained without intravenous contrast. Angiographic images of the Circle of Willis were obtained using MRA technique without intravenous contrast. Angiographic images of the neck were obtained using MRA technique without intravenous contrast. Carotid stenosis measurements (when applicable) are obtained utilizing NASCET criteria, using the distal internal carotid diameter as the denominator. COMPARISON:  CT 12/18/2020.  MRI 09/11/2020. FINDINGS: MRI HEAD FINDINGS Brain: Diffusion imaging shows subacute infarctions within the cerebellar hemispheres. Subacute infarction of the left thalamus as seen previously. Punctate acute/subacute infarction in the right thalamus. Numerous acute/subacute infarctions seen affecting both cerebral hemispheres in a front to back distribution typical of watershed infarctions. No large confluent infarction. No evidence of hemorrhage or swelling. No hydrocephalus or extra-axial collection. Vascular: Major vessels at the base of the brain show flow. Skull and upper cervical spine: Negative Sinuses/Orbits: Clear/normal Other: Sebaceous cyst of the right frontal scalp. Bilateral mastoid effusions. MRA HEAD FINDINGS Both internal carotid arteries are widely patent into the brain. No siphon stenosis. The anterior and middle cerebral vessels are patent without proximal stenosis, aneurysm or vascular malformation. Both vertebral arteries are widely patent to the basilar. No basilar stenosis. Posterior circulation branch vessels appear normal. MRA NECK FINDINGS Both common carotid arteries are widely patent to the bifurcation. Both carotid bifurcations appear widely patent.  Vertebral artery origins are poorly seen using this technique, but the vessels appear normal beyond the  origins, through the cervical region to the basilar. IMPRESSION: Multiple acute/subacute infarctions noted within the cerebellum, thalami (left larger than right, and both cerebral hemispheres running from front to back in a watershed distribution. Most of these were probably acute on the study of 12/16/2020. I cannot establish any definite new insult since the previous CT exam. MR angiography of the neck vessels without contrast does not show any abnormal finding in the neck. Detail is somewhat limited without contrast enhanced technique. MR angiography of the intracranial vessels is normal. Electronically Signed   By: Nelson Chimes M.D.   On: 12/22/2020 18:35   MR ANGIO NECK WO CONTRAST  Result Date: 12/22/2020 CLINICAL DATA:  Stroke, follow-up. Subacute left thalamic infarction. Parietal and occipital infarctions. EXAM: MRI HEAD WITHOUT CONTRAST MRA HEAD WITHOUT CONTRAST MRA NECK WITHOUT CONTRAST TECHNIQUE: Multiplanar, multiecho pulse sequences of the brain and surrounding structures were obtained without intravenous contrast. Angiographic images of the Circle of Willis were obtained using MRA technique without intravenous contrast. Angiographic images of the neck were obtained using MRA technique without intravenous contrast. Carotid stenosis measurements (when applicable) are obtained utilizing NASCET criteria, using the distal internal carotid diameter as the denominator. COMPARISON:  CT 12/18/2020.  MRI 09/11/2020. FINDINGS: MRI HEAD FINDINGS Brain: Diffusion imaging shows subacute infarctions within the cerebellar hemispheres. Subacute infarction of the left thalamus as seen previously. Punctate acute/subacute infarction in the right thalamus. Numerous acute/subacute infarctions seen affecting both cerebral hemispheres in a front to back distribution typical of watershed infarctions. No large confluent  infarction. No evidence of hemorrhage or swelling. No hydrocephalus or extra-axial collection. Vascular: Major vessels at the base of the brain show flow. Skull and upper cervical spine: Negative Sinuses/Orbits: Clear/normal Other: Sebaceous cyst of the right frontal scalp. Bilateral mastoid effusions. MRA HEAD FINDINGS Both internal carotid arteries are widely patent into the brain. No siphon stenosis. The anterior and middle cerebral vessels are patent without proximal stenosis, aneurysm or vascular malformation. Both vertebral arteries are widely patent to the basilar. No basilar stenosis. Posterior circulation branch vessels appear normal. MRA NECK FINDINGS Both common carotid arteries are widely patent to the bifurcation. Both carotid bifurcations appear widely patent. Vertebral artery origins are poorly seen using this technique, but the vessels appear normal beyond the origins, through the cervical region to the basilar. IMPRESSION: Multiple acute/subacute infarctions noted within the cerebellum, thalami (left larger than right, and both cerebral hemispheres running from front to back in a watershed distribution. Most of these were probably acute on the study of 12/16/2020. I cannot establish any definite new insult since the previous CT exam. MR angiography of the neck vessels without contrast does not show any abnormal finding in the neck. Detail is somewhat limited without contrast enhanced technique. MR angiography of the intracranial vessels is normal. Electronically Signed   By: Nelson Chimes M.D.   On: 12/22/2020 18:35   MR BRAIN WO CONTRAST  Result Date: 12/22/2020 CLINICAL DATA:  Stroke, follow-up. Subacute left thalamic infarction. Parietal and occipital infarctions. EXAM: MRI HEAD WITHOUT CONTRAST MRA HEAD WITHOUT CONTRAST MRA NECK WITHOUT CONTRAST TECHNIQUE: Multiplanar, multiecho pulse sequences of the brain and surrounding structures were obtained without intravenous contrast. Angiographic  images of the Circle of Willis were obtained using MRA technique without intravenous contrast. Angiographic images of the neck were obtained using MRA technique without intravenous contrast. Carotid stenosis measurements (when applicable) are obtained utilizing NASCET criteria, using the distal internal carotid diameter as the denominator. COMPARISON:  CT 12/18/2020.  MRI 09/11/2020.  FINDINGS: MRI HEAD FINDINGS Brain: Diffusion imaging shows subacute infarctions within the cerebellar hemispheres. Subacute infarction of the left thalamus as seen previously. Punctate acute/subacute infarction in the right thalamus. Numerous acute/subacute infarctions seen affecting both cerebral hemispheres in a front to back distribution typical of watershed infarctions. No large confluent infarction. No evidence of hemorrhage or swelling. No hydrocephalus or extra-axial collection. Vascular: Major vessels at the base of the brain show flow. Skull and upper cervical spine: Negative Sinuses/Orbits: Clear/normal Other: Sebaceous cyst of the right frontal scalp. Bilateral mastoid effusions. MRA HEAD FINDINGS Both internal carotid arteries are widely patent into the brain. No siphon stenosis. The anterior and middle cerebral vessels are patent without proximal stenosis, aneurysm or vascular malformation. Both vertebral arteries are widely patent to the basilar. No basilar stenosis. Posterior circulation branch vessels appear normal. MRA NECK FINDINGS Both common carotid arteries are widely patent to the bifurcation. Both carotid bifurcations appear widely patent. Vertebral artery origins are poorly seen using this technique, but the vessels appear normal beyond the origins, through the cervical region to the basilar. IMPRESSION: Multiple acute/subacute infarctions noted within the cerebellum, thalami (left larger than right, and both cerebral hemispheres running from front to back in a watershed distribution. Most of these were probably  acute on the study of 12/16/2020. I cannot establish any definite new insult since the previous CT exam. MR angiography of the neck vessels without contrast does not show any abnormal finding in the neck. Detail is somewhat limited without contrast enhanced technique. MR angiography of the intracranial vessels is normal. Electronically Signed   By: Nelson Chimes M.D.   On: 12/22/2020 18:35   DG Chest Port 1 View  Result Date: 12/24/2020 CLINICAL DATA:  Intubation, acute respiratory failure with hypoxia, history hypertension, smoker EXAM: PORTABLE CHEST 1 VIEW COMPARISON:  Portable exam 0521 hours compared to 12/19/2020 FINDINGS: Tip of endotracheal tube projects 2.5 cm above carina. Feeding tube extends into stomach. Two RIGHT jugular catheters with tips projecting over cavoatrial junction region. Stable heart size and mediastinal contours. Low lung volumes with bibasilar atelectasis. Mild atelectasis versus infiltrate LEFT upper lobe. No pleural effusion or pneumothorax. IMPRESSION: Bibasilar atelectasis with additional mild atelectasis versus infiltrate LEFT upper lobe. Electronically Signed   By: Lavonia Dana M.D.   On: 12/24/2020 08:26    Labs: BMET Recent Labs  Lab 12/21/20 0517 12/21/20 1600 12/22/20 0521 12/22/20 1600 12/23/20 0526 12/23/20 1601 12/23/20 2023 12/24/20 0345  NA 134* 135 137 136 138 138  --  137  K 4.6 5.1 4.9 5.1 5.7* 5.9* 4.8 4.9  CL 101 102 101 103 105 105  --  103  CO2 _0 --  25  GLUCOSE 121* 107* 135* 133* 118* 123*  --  129*  BUN 48* 63* 81* 94* 109* 119*  --  92*  CREATININE 3.06* 4.18* 4.95* 5.90* 6.57* 6.92*  --  5.75*  CALCIUM 8.5* 8.6* 8.9 8.7* 8.8* 9.1  --  8.7*  PHOS 2.5 3.7 3.2 3.6 4.5 5.0*  --  4.3   CBC Recent Labs  Lab 12/19/20 0518 12/19/20 1731 12/21/20 0517 12/22/20 0521 12/23/20 0526 12/24/20 0345  WBC 7.1   < > 13.4* 12.7* 11.9* 10.3  NEUTROABS 5.2  --   --   --   --   --   HGB 7.2*   < > 8.8* 8.0* 7.4* 7.0*  HCT 21.7*    < > 27.0* 24.8* 22.8* 21.9*  MCV 83.1   < >  85.2 86.7 86.7 88.0  PLT 132*   < > 211 243 233 195   < > = values in this interval not displayed.   Medications:     albuterol  2.5 mg Nebulization Q6H   alteplase  2 mg Intracatheter Once   amLODipine  10 mg Per Tube q AM   vitamin C  250 mg Per Tube Daily   bacitracin   Topical Daily   chlorhexidine gluconate (MEDLINE KIT)  15 mL Mouth Rinse BID   Chlorhexidine Gluconate Cloth  6 each Topical Daily   cholecalciferol  2,000 Units Per Tube BID   ciprofloxacin  1 drop Left Eye Q4H while awake   docusate  100 mg Per Tube BID   folic acid  1 mg Per Tube Daily   gabapentin  300 mg Per Tube Daily   lamoTRIgine  100 mg Per Tube BID   levETIRAcetam  500 mg Per Tube BID   [START ON 12/25/2020] levETIRAcetam  500 mg Per Tube Q T,Th,Sat-1800   mouth rinse  15 mL Mouth Rinse 10 times per day   pantoprazole sodium  40 mg Per Tube QHS   polyethylene glycol  17 g Per Tube Daily   QUEtiapine  200 mg Per Tube QHS   sertraline  100 mg Per Tube QPC breakfast   sodium chloride flush  3 mL Intravenous Q12H   [START ON 12/25/2020] thiamine injection  100 mg Intravenous Daily   vitamin B-12  1,000 mcg Per Tube Daily   zinc sulfate  220 mg Per Tube Daily   Elmarie Shiley, MD 12/24/2020, 9:21 AM

## 2020-12-25 ENCOUNTER — Inpatient Hospital Stay (HOSPITAL_COMMUNITY): Payer: No Typology Code available for payment source

## 2020-12-25 DIAGNOSIS — R410 Disorientation, unspecified: Secondary | ICD-10-CM | POA: Diagnosis not present

## 2020-12-25 DIAGNOSIS — J9601 Acute respiratory failure with hypoxia: Secondary | ICD-10-CM | POA: Diagnosis not present

## 2020-12-25 DIAGNOSIS — Z930 Tracheostomy status: Secondary | ICD-10-CM

## 2020-12-25 DIAGNOSIS — J9602 Acute respiratory failure with hypercapnia: Secondary | ICD-10-CM | POA: Diagnosis not present

## 2020-12-25 LAB — RENAL FUNCTION PANEL
Albumin: 1.8 g/dL — ABNORMAL LOW (ref 3.5–5.0)
Albumin: 1.9 g/dL — ABNORMAL LOW (ref 3.5–5.0)
Anion gap: 12 (ref 5–15)
Anion gap: 9 (ref 5–15)
BUN: 112 mg/dL — ABNORMAL HIGH (ref 6–20)
BUN: 118 mg/dL — ABNORMAL HIGH (ref 6–20)
CO2: 22 mmol/L (ref 22–32)
CO2: 24 mmol/L (ref 22–32)
Calcium: 9.3 mg/dL (ref 8.9–10.3)
Calcium: 9 mg/dL (ref 8.9–10.3)
Chloride: 106 mmol/L (ref 98–111)
Chloride: 105 mmol/L (ref 98–111)
Creatinine, Ser: 6.93 mg/dL — ABNORMAL HIGH (ref 0.61–1.24)
Creatinine, Ser: 6.9 mg/dL — ABNORMAL HIGH (ref 0.61–1.24)
GFR, Estimated: 9 mL/min — ABNORMAL LOW (ref >60)
GFR, Estimated: 9 mL/min — ABNORMAL LOW (ref >60)
Glucose, Bld: 113 mg/dL — ABNORMAL HIGH (ref 70–99)
Glucose, Bld: 108 mg/dL — ABNORMAL HIGH (ref 70–99)
Phosphorus: 5.2 mg/dL — ABNORMAL HIGH (ref 2.5–4.6)
Phosphorus: 6.8 mg/dL — ABNORMAL HIGH (ref 2.5–4.6)
Potassium: 5.2 mmol/L — ABNORMAL HIGH (ref 3.5–5.1)
Potassium: 5.2 mmol/L — ABNORMAL HIGH (ref 3.5–5.1)
Sodium: 140 mmol/L (ref 135–145)
Sodium: 138 mmol/L (ref 135–145)

## 2020-12-25 LAB — PROTIME-INR
INR: 1.2 (ref 0.8–1.2)
Prothrombin Time: 14.8 seconds (ref 11.4–15.2)

## 2020-12-25 LAB — CBC
HCT: 22.1 % — ABNORMAL LOW (ref 39.0–52.0)
Hemoglobin: 6.7 g/dL — CL (ref 13.0–17.0)
MCH: 27.5 pg (ref 26.0–34.0)
MCHC: 30.3 g/dL (ref 30.0–36.0)
MCV: 90.6 fL (ref 80.0–100.0)
Platelets: 216 10*3/uL (ref 150–400)
RBC: 2.44 MIL/uL — ABNORMAL LOW (ref 4.22–5.81)
RDW: 17.3 % — ABNORMAL HIGH (ref 11.5–15.5)
WBC: 11 10*3/uL — ABNORMAL HIGH (ref 4.0–10.5)
nRBC: 0 % (ref 0.0–0.2)

## 2020-12-25 LAB — VITAMIN B1: Vitamin B1 (Thiamine): 98.1 nmol/L (ref 66.5–200.0)

## 2020-12-25 LAB — HEMOGLOBIN AND HEMATOCRIT, BLOOD
HCT: 24.1 % — ABNORMAL LOW (ref 39.0–52.0)
Hemoglobin: 8 g/dL — ABNORMAL LOW (ref 13.0–17.0)

## 2020-12-25 LAB — GLUCOSE, CAPILLARY
Glucose-Capillary: 110 mg/dL — ABNORMAL HIGH (ref 70–99)
Glucose-Capillary: 110 mg/dL — ABNORMAL HIGH (ref 70–99)
Glucose-Capillary: 92 mg/dL (ref 70–99)
Glucose-Capillary: 96 mg/dL (ref 70–99)
Glucose-Capillary: 98 mg/dL (ref 70–99)
Glucose-Capillary: 110 mg/dL — ABNORMAL HIGH (ref 70–99)

## 2020-12-25 LAB — APTT: aPTT: 59 seconds — ABNORMAL HIGH (ref 24–36)

## 2020-12-25 LAB — MAGNESIUM: Magnesium: 2.8 mg/dL — ABNORMAL HIGH (ref 1.7–2.4)

## 2020-12-25 LAB — HEPATITIS B E ANTIBODY: Hep B E Ab: NEGATIVE

## 2020-12-25 LAB — PREPARE RBC (CROSSMATCH)

## 2020-12-25 MED ORDER — SODIUM CHLORIDE 0.9% FLUSH
10.0000 mL | Freq: Two times a day (BID) | INTRAVENOUS | Status: DC
  Administered 2020-12-25 – 2021-01-07 (×17): 10 mL

## 2020-12-25 MED ORDER — ALBUMIN HUMAN 5 % IV SOLN
12.5000 g | Freq: Once | INTRAVENOUS | Status: AC
  Administered 2020-12-25: 12.5 g via INTRAVENOUS
  Filled 2020-12-25: qty 250

## 2020-12-25 MED ORDER — NEPRO/CARBSTEADY PO LIQD
1000.0000 mL | ORAL | Status: DC
  Administered 2020-12-25 – 2021-01-01 (×7): 1000 mL
  Filled 2020-12-25 (×10): qty 1000

## 2020-12-25 MED ORDER — MUPIROCIN 2 % EX OINT
TOPICAL_OINTMENT | CUTANEOUS | Status: AC
  Filled 2020-12-25: qty 22

## 2020-12-25 MED ORDER — SODIUM CHLORIDE 0.9% IV SOLUTION: Freq: Once | INTRAVENOUS | Status: AC

## 2020-12-25 MED ORDER — PROSOURCE TF PO LIQD
45.0000 mL | Freq: Three times a day (TID) | ORAL | Status: DC
  Administered 2020-12-25 – 2021-01-07 (×37): 45 mL
  Filled 2020-12-25 (×35): qty 45

## 2020-12-25 MED ORDER — SODIUM CHLORIDE 0.9% FLUSH: 10.0000 mL | INTRAVENOUS | Status: DC | PRN

## 2020-12-25 NOTE — Progress Notes (Signed)
Nutrition Follow-up  DOCUMENTATION CODES:   Not applicable  INTERVENTION:   Tube feeding via Cortrak tube: Change to:  Nepro @ 55 ml/h (1320 ml per day) 45 ml ProSource TF TID  Provides 2496 kcal, 139 gm protein, 1003 ml free water daily   NUTRITION DIAGNOSIS:   Increased nutrient needs related to wound healing as evidenced by estimated needs. Ongoing.   GOAL:   Patient will meet greater than or equal to 90% of their needs Met with TF.   MONITOR:   TF tolerance  REASON FOR ASSESSMENT:   Consult Enteral/tube feeding initiation and management  ASSESSMENT:   Pt with PMH of anxiety, PTSD, bipolar 1 disorder, depression, HTN, Sz, sleep apnea, and a smoker who was admitted for severe scoliosis with multi-level listhesis.    Pt discussed during ICU rounds and with RN. Pt developed AKI and rhabdomyolysis after complex two-stage spinal surgery requiring CRRT.  Pt failed extubation yesterday. Pt had leakage from his surgical wound.  Neurology following for bilateral cortical MCA/ACA watershed infarcts concerning for perioperative hypotension.   8/3 s/p L1-2, L2-3, L3-4, L4-5, DLIFs 8/4 T10-pelvis (10 procedure) 8/7 started on CRRT 8/12 CRRT stopped, tunneled HD catheter placed, cortrak placed with tip gastric 8/15 extubated and re-intubated later that day.   Patient is currently intubated on ventilator support MV: 14.6 L/min Temp (24hrs), Avg:98.5 F (36.9 C), Min:97.6 F (36.4 C), Max:99.4 F (37.4 C) MAP: 57-65, up to 85 this am   Medications reviewed and include: vitamin D3, colace, folic acid, protonix, miralax, thiamine, vitamin B12, zinc sulfate (8/11) Fentanyl  Labs reviewed: K: 5.9 -> 4.9 -> 5.4 -> 5.6 -> 5.2;  PO4: 5.2 CBG's: 110-142  UOP: 900 ml I&O: +19 L Weight down to 103.3 kg from 115.9 kg 8/9 Mild-moderate edema on exam   Diet Order:   Diet Order             Diet NPO time specified  Diet effective midnight                    EDUCATION NEEDS:   Not appropriate for education at this time  Skin:  Skin Assessment: Skin Integrity Issues: Skin Integrity Issues:: DTI DTI: L heel  Last BM:  (P) 8/15 small  Height:   Ht Readings from Last 1 Encounters:  12/24/20 6' (1.829 m)    Weight:   Wt Readings from Last 1 Encounters:  12/24/20 103.3 kg    BMI:  Body mass index is 30.89 kg/m.  Estimated Nutritional Needs:   Kcal:  2200-2500  Protein:  130-150 grams  Fluid:  >2 L/day  Lockie Pares., RD, LDN, CNSC See AMiON for contact information

## 2020-12-25 NOTE — Progress Notes (Signed)
   Providing Compassionate, Quality Care - Together  NEUROSURGERY PROGRESS NOTE   S: Some episodes of hypotension overnight, improved.  Extubated then reintubated yesterday.  O: EXAM:  BP 126/74 (BP Location: Left Arm)   Pulse 83   Temp 97.6 F (36.4 C) (Axillary)   Resp 14   Ht 6' (1.829 m)   Wt 103.3 kg   SpO2 95%   BMI 30.89 kg/m   Intubated, sedated Eyes open spontaneously PERRL Moves all extremities, follows commands Appears to be slightly weaker/less spontaneous on the right upper and lower Incision was leaking this AM.  Dressing taken down.  I could express a significant amount of serosanguineous drainage from the superior part of the incision.  I did this until no further fluid could be expressed.  Otherwise skin approximated and appears to be healing appropriately.  There are small 2 regions near the top of the incision with some superficial skin breakdown.  No erythema, staples remain in place.  ASSESSMENT:  60 y.o. male with   Degenerative Scoliosis with severe stenosis  AKI  VDRF  Acute blood loss anemia, expected     - status post stage I LLIF L1-L5 on 12/12/2020 - status post stage II posterior instrumentation T10-pelvis, L2- 5 PCOs, L5-S1 TLIF on 12/13/2020   PLAN: - continue supportive care - hemodialysis per Nephrology - neurology planning TEE  - trach today, may need peg pending weaning of sedation/progression - daily dressing changes of thoracolumbar wound, Q2h log rolling - heparin drip for acute DVT, status post IVC filter     Thank you for allowing me to participate in this patient's care.  Please do not hesitate to call with questions or concerns.   Elwin Sleight, [facility] Cell: [phone]

## 2020-12-25 NOTE — Progress Notes (Signed)
[place] for heparin Indication: DVT  No Known Allergies  Patient Measurements: Height: 6' (182.9 cm) Weight: 103.3 kg (227 lb 11.8 oz) IBW/kg (Calculated) : 77.6  Vital Signs: Temp: 98.7 F (37.1 C) (08/16 0400) Temp Source: Axillary (08/16 0400) BP: 88/49 (08/16 0600) Pulse Rate: 74 (08/16 0500)  Labs: Recent Labs    12/23/20 0526 12/23/20 1601 12/24/20 0345 12/24/20 1359 12/24/20 1637 12/25/20 0434  HGB 7.4*  --  7.0* 10.5*  --   --   HCT 22.8*  --  21.9* 31.0*  --   --   PLT 233  --  195  --   --   --   APTT 50*  --  48*  --  38* 59*  LABPROT  --   --   --   --   --  14.8  INR  --   --   --   --   --  1.2  CREATININE 6.57* 6.92* 5.75*  --  6.39*  --     Estimated Creatinine Clearance: 15.7 mL/min (A) (by C-G formula based on SCr of 6.39 mg/dL (H)).  Assessment: 60 yo male with RLE DVT s/p recent lumbar fusion.  Patient continues on IV heparin and transitioned to iHD per nephro on 8/14.   8/16 AM aPTT 59 sec on 2800 units/hr.  No documented signs/symptoms of bleeding Chest hematoma stable at this time  Goal of Therapy:  aPTT 50-75 seconds.  Monitor platelets by anticoagulation protocol: Yes   Plan:  Continue heparin at 2800 units/hr Recheck in 8 hours Planm for Qam aPTT and CBC if stable Monitor closely for any s/s of bleeding near incision site.   **Plan for trach in the upcoming future*  Keith Dixon 12/25/2020 7:24 AM

## 2020-12-25 NOTE — Progress Notes (Signed)
Patient ID: Keith Dixon, male   DOB: 27-Jul-1960, 60 y.o.   MRN: [id number] [facility] Progress Note   Assessment/ Plan:   1.  Acute kidney injury: Appears to be from a combination of ATN and rhabdomyolysis/pigment nephropathy.  He suffered dense renal injury and has been nonoliguric overnight without evidence of renal recovery on labs.  He is on schedule for hemodialysis today for management of azotemia/volume.   2.  Acute postoperative hypoxic/hypercapnic respiratory failure: Reintubated yesterday after briefly extubated and currently on ventilator support per CCM.  Determination for tracheostomy per CCM/neurosurgery. 4.  Lumbar spine stenosis/scoliosis status post decompression and fusion surgery: Ongoing management/surveillance by neurosurgery. 5.  Shock: Secondary to distributive/hypovolemic mechanism; appears to have resolved earlier but recurrence of hypotension noted overnight prompting need for albumin.  Hopefully his current blood pressure will be able to withstand intermittent hemodialysis. 6.  Acute blood loss anemia: Secondary to surgical associated losses/critical illness, downtrending of hemoglobin and hematocrit noted and will defer decision for PRBC transfusion to primary service. 7.  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  Subjective:   Hypotensive overnight with decreased sedation and as needed albumin.  Failed extubation yesterday.   Objective:   BP 126/74   Pulse 83   Temp 97.6 F (36.4 C) (Axillary)   Resp 14   Ht 6' (1.829 m)   Wt 103.3 kg   SpO2 95%   BMI 30.89 kg/m   Intake/Output Summary (Last 24 hours) at 12/25/2020 3300 Last data filed at 12/25/2020 0800 Gross per 24 hour  Intake 2925.06 ml  Output 900 ml  Net 2025.06 ml   Weight change:   Physical Exam: Gen: Reintubated yesterday after brief time of extubation following which he developed increased work of breathing/stridor CVS: Pulse  regular rhythm, tachycardic, S1 and S2 normal Resp: Anteriorly clear to auscultation, no rales/rhonchi.  Right IJ TDC Abd: Soft, obese, nontender, bowel sounds normal Ext: Trace-1+ upper extremity edema, 1+ edema lower extremities  Imaging: DG CHEST PORT 1 VIEW  Result Date: 12/24/2020 CLINICAL DATA:  Intubation EXAM: PORTABLE CHEST 1 VIEW COMPARISON:  Portable exam 1227 hours compared to 12/24/2020 at 0521 hours FINDINGS: Tip of endotracheal tube approximately 13 mm above carina. Feeding tube extends into stomach. Two RIGHT jugular central venous catheters with tips projecting over SVC. Enlargement of cardiac silhouette with persistent elevation of RIGHT diaphragm. Decreased lung volumes with bibasilar atelectasis and probable LEFT perihilar infiltrate. No definite pleural effusion or pneumothorax. Prior thoracolumbar fusion. IMPRESSION: Decreased lung volumes greater on RIGHT with bibasilar atelectasis and suspected LEFT perihilar infiltrate. Electronically Signed   By: Lavonia Dana M.D.   On: 12/24/2020 13:20   DG Chest Port 1 View  Result Date: 12/24/2020 CLINICAL DATA:  Intubation, acute respiratory failure with hypoxia, history hypertension, smoker EXAM: PORTABLE CHEST 1 VIEW COMPARISON:  Portable exam 0521 hours compared to 12/19/2020 FINDINGS: Tip of endotracheal tube projects 2.5 cm above carina. Feeding tube extends into stomach. Two RIGHT jugular catheters with tips projecting over cavoatrial junction region. Stable heart size and mediastinal contours. Low lung volumes with bibasilar atelectasis. Mild atelectasis versus infiltrate LEFT upper lobe. No pleural effusion or pneumothorax. IMPRESSION: Bibasilar atelectasis with additional mild atelectasis versus infiltrate LEFT upper lobe. Electronically Signed   By: Lavonia Dana M.D.   On: 12/24/2020 08:26    Labs: BMET Recent Labs  Lab 12/22/20 0521 12/22/20 1600 12/23/20 0526 12/23/20 1601 12/23/20 2023 12/24/20 0345 12/24/20 1359  12/24/20 1637 12/25/20  0608  NA 137 136 138 138  --  137 137 137 140  K 4.9 5.1 5.7* 5.9* 4.8 4.9 5.4* 5.6* 5.2*  CL 101 103 105 105  --  103  --  102 106  CO2 _0 --  25  --  23 22  GLUCOSE 135* 133* 118* 123*  --  129*  --  125* 113*  BUN 81* 94* 109* 119*  --  92*  --  102* 112*  CREATININE 4.95* 5.90* 6.57* 6.92*  --  5.75*  --  6.39* 6.93*  CALCIUM 8.9 8.7* 8.8* 9.1  --  8.7*  --  9.2 9.3  PHOS 3.2 3.6 4.5 5.0*  --  4.3  --  5.3* 5.2*   CBC Recent Labs  Lab 12/19/20 0518 12/19/20 1731 12/22/20 0521 12/23/20 0526 12/24/20 0345 12/24/20 1359 12/25/20 0746  WBC 7.1   < > 12.7* 11.9* 10.3  --  11.0*  NEUTROABS 5.2  --   --   --   --   --   --   HGB 7.2*   < > 8.0* 7.4* 7.0* 10.5* 6.7*  HCT 21.7*   < > 24.8* 22.8* 21.9* 31.0* 22.1*  MCV 83.1   < > 86.7 86.7 88.0  --  90.6  PLT 132*   < > 243 233 195  --  216   < > = values in this interval not displayed.   Medications:     sodium chloride   Intravenous Once   albuterol  2.5 mg Nebulization Q6H   alteplase  2 mg Intracatheter Once   aspirin  81 mg Per Tube Daily   bacitracin   Topical Daily   chlorhexidine gluconate (MEDLINE KIT)  15 mL Mouth Rinse BID   Chlorhexidine Gluconate Cloth  6 each Topical Daily   cholecalciferol  2,000 Units Per Tube BID   ciprofloxacin  1 drop Left Eye Q4H while awake   docusate  100 mg Per Tube BID   etomidate  20 mg Intravenous Once   fentaNYL (SUBLIMAZE) injection  200 mcg Intravenous Once   folic acid  1 mg Per Tube Daily   gabapentin  300 mg Per Tube Daily   lamoTRIgine  100 mg Per Tube BID   levETIRAcetam  500 mg Per Tube BID   levETIRAcetam  500 mg Per Tube Q T,Th,Sat-1800   mouth rinse  15 mL Mouth Rinse 10 times per day   midazolam  5 mg Intravenous Once   pantoprazole sodium  40 mg Per Tube QHS   polyethylene glycol  17 g Per Tube Daily   QUEtiapine  200 mg Per Tube QHS   sertraline  100 mg Per Tube QPC breakfast   sodium chloride flush  10-40 mL Intracatheter Q12H    sodium chloride flush  3 mL Intravenous Q12H   thiamine injection  100 mg Intravenous Daily   vecuronium  10 mg Intravenous Once   vitamin B-12  1,000 mcg Per Tube Daily   zinc sulfate  220 mg Per Tube Daily   Elmarie Shiley, MD 12/25/2020, 9:06 AM

## 2020-12-25 NOTE — Progress Notes (Signed)
Contacted [facility] about patient's low map over the entire shift. Have continued to lower precedex and fentanyl during the shift, however the MAP had barely moved.

## 2020-12-25 NOTE — Progress Notes (Addendum)
Patient ID: Keith Dixon, male   DOB: 1961/01/02, 60 y.o.   MRN: [id number] STROKE TEAM PROGRESS NOTE   INTERVAL HISTORY Patient got reintubated yesterday for respiratory failure.  He had leakage of fluid from his surgical wound in the back and Dr. Reatha Armour neurosurgeon has just changed his bandages on his back surgical wound.  He is drowsy but can be easily aroused.  He follows simple commands. .  He has good antigravity strength on the left.  He continues to have right hemiparesis.  Vital signs are stable. Vitals:   12/25/20 1114 12/25/20 1115 12/25/20 1130 12/25/20 1141  BP: (!) 112/59 113/62 118/60   Pulse: 76 75 76   Resp: '16 15 11   '$ Temp:    98.4 F (36.9 C)  TempSrc:    Axillary  SpO2: 99% 99% 100%   Weight:      Height:       CBC:  Recent Labs  Lab 12/19/20 0518 12/19/20 1731 12/24/20 0345 12/24/20 1359 12/25/20 0746  WBC 7.1   < > 10.3  --  11.0*  NEUTROABS 5.2  --   --   --   --   HGB 7.2*   < > 7.0* 10.5* 6.7*  HCT 21.7*   < > 21.9* 31.0* 22.1*  MCV 83.1   < > 88.0  --  90.6  PLT 132*   < > 195  --  216   < > = values in this interval not displayed.   Basic Metabolic Panel:  Recent Labs  Lab 12/24/20 0345 12/24/20 1359 12/24/20 1637 12/25/20 0608  NA 137   < > 137 140  K 4.9   < > 5.6* 5.2*  CL 103  --  102 106  CO2 25  --  23 22  GLUCOSE 129*  --  125* 113*  BUN 92*  --  102* 112*  CREATININE 5.75*  --  6.39* 6.93*  CALCIUM 8.7*  --  9.2 9.3  MG 2.7*  --   --  2.8*  PHOS 4.3  --  5.3* 5.2*   < > = values in this interval not displayed.   Lipid Panel:  Recent Labs  Lab 12/20/20 0339  TRIG 213*   HgbA1c:  No results for input(s): HGBA1C in the last 168 hours.   IMAGING past 24 hours DG CHEST PORT 1 VIEW  Result Date: 12/24/2020 CLINICAL DATA:  Intubation EXAM: PORTABLE CHEST 1 VIEW COMPARISON:  Portable exam 1227 hours compared to 12/24/2020 at 0521 hours FINDINGS: Tip of endotracheal tube approximately 13 mm above carina. Feeding tube extends  into stomach. Two RIGHT jugular central venous catheters with tips projecting over SVC. Enlargement of cardiac silhouette with persistent elevation of RIGHT diaphragm. Decreased lung volumes with bibasilar atelectasis and probable LEFT perihilar infiltrate. No definite pleural effusion or pneumothorax. Prior thoracolumbar fusion. IMPRESSION: Decreased lung volumes greater on RIGHT with bibasilar atelectasis and suspected LEFT perihilar infiltrate. Electronically Signed   By: Lavonia Dana M.D.   On: 12/24/2020 13:20     Physical Exam   Temp:  [97.6 F (36.4 C)-99.4 F (37.4 C)] 98.4 F (36.9 C) (08/16 1141) Pulse Rate:  [63-103] 76 (08/16 1130) Resp:  [0-27] 11 (08/16 1130) BP: (82-144)/(40-87) 118/60 (08/16 1130) SpO2:  [93 %-100 %] 100 % (08/16 1130) FiO2 (%):  [40 %-80 %] 40 % (08/16 1114)  General -obese middle-aged African-American male mild respiratory distress postextubation Ophthalmologic - fundi not visualized due to noncooperation.  Cardiovascular -  Regular rate and rhythm.  Neuro -patient extubated.  Is drowsy but can be aroused easily follows simple one-step midline commands.  Eyes midline, not blinking to visual threat, +doll's eyes. Eyes are disconjugate with skew eye deviation with right high hypotropia Exotropia.  Mild right lower facial weakness when he smiles.  Tongue protrusion midline.  Spontaneous antigravity movements on the left side.  Right hemiparesis with right upper extremity 2/5 and right lower extremity 2-3/5 strength  DTR diminished and no babinski. Sensation, coordination and gait not tested.     Assessment and Plan  Keith Dixon is a 60 y.o. male with PMH significant for PTSD, OSA, HTN, tobacco use, ?seizures, bipolar, degenerativse scoliosis who underwent extensive T10-pelvis decompression and fusion over 2 days(8/4 and 8/5) and difficulty waking up after surgery. Also complicated by Aki, rhabdomyolysis, DVT in RLE and left IJ s/p IVC filter and poor  mentation/encephalopathy. Workup with CTH demonstrated embolic appearing strokes and neurology consulted.  #Stroke: bilateral cortical MCA/ACA watershed cortical as well as subcortical left thalamic infarcts, concerning for perioperative hypotension in the setting of intracranial stenosis. Can not rule out cardioembolic source CT head Small recent appearing bilateral cortical infarcts, left thalamic and probable bilateral cerebellar infarcts.  MRI  unable d/t hardware and CRRT with multiple lines of meds Carotid Doppler w/LICA [id number] % stenosis, RICA not able to sonicate due to central line CTA Head on 09/11/20 with no significant intracranial stenosis MRI brain watershed infarcts in cerebellum, thalami. MRA head and neck Neg. 2D Echo w/EF 65 to 70 %, LA normal in size  BLE doppler showed right LE DVT TCD with Bubble was negative for PFO  TCD  mild elevated velocity of left MCA, right MCA and right ICA siphon, mild  stenosis in these vessels. LDL 30 HgbA1c 5.8 VTE prophylaxis - heparin IV No antithrombotic prior to admission, now on heparin IV given DVT. Continue heparin IV now given possible trach soon Therapy recommendations:  pending  #Seizure History  LTM this admission did not show any seizures or epileptiform discharges.  LTM d/c 8/9 Continue with Lamictal 100 mg BID and Keppra 1000 mg BID.   DVT LE venous doppler showed right LE DVT Also left IJ thrombosis found during line placement S/p IVC filter Now on heparin IV  AKI with renal failure Cre 6.74->4.91->4.12->3.37->2.83->2.67->3.06 Was on CRRT -> off now Placed HD catheter  Likely HD tomorrow Nephrology on board  Severe anemia Hb 7.5->6.1-> PRBC->7.2->9.3->9.1->8.8 Continue CBC monitoring Continue heparin IV for now  Extensive back surgery Dr. Marcello Moores on board Status post surgery 8/4 and 8/5 Drainage removed 8/9  Respiratory failure Intubated on vent Off propofol Still on fentanyl CCM on board May need  tracheostomy - continue heparin IV for now  #Hypertension Home meds:  norvasc Stable Long-term BP goal normotensive  Hyperlipidemia Home meds:  none  LDL 30, goal < 70 No statin needed at the time given LDL at goal  Tobacco abuse Current smoker Smoking cessation counseling will be provided when he is more alert.  Other Stroke Risk Factors ETOH use, alcohol level <10, advised to drink no more than 2 drink(s) a day Substance abuse - UDS:  reported THC use Obesity, Body mass index is 30.89 kg/m., BMI >/= 30 associated with increased stroke risk, recommend weight loss, diet and exercise as appropriate   Other Active Problems B12 deficiency, supplement Hypocalcemia   Hospital day # 13  Patient got reintubated for respiratory failure.  Recommend TEE to look for cardiac source  of embolism and if unyielding may need prolonged cardiac monitoring after discharge for paroxysmal A. fib.  Continue aspirin alone for stroke prevention given significant anemia requiring blood transfusion.  Discussed with Dr. Lynetta Mare critical care medicine.  And Dr. Reatha Armour neurosurgery This patient is critically ill and at significant risk of neurological worsening, death and care requires constant monitoring of vital signs, hemodynamics,respiratory and cardiac monitoring, extensive review of multiple databases, frequent neurological assessment, discussion with family, other specialists and medical decision making of high complexity.I have made any additions or clarifications directly to the above note.This critical care time does not reflect procedure time, or teaching time or supervisory time of PA/NP/Med Resident etc but could involve care discussion time.  I spent 30 minutes of neurocritical care time  in the care of  this patient.      Antony Contras, MD 12/25/2020 12:41 PM   To contact Stroke Continuity provider, please refer to [email or web address] After hours, contact General Neurology

## 2020-12-25 NOTE — Progress Notes (Signed)
eLink Physician-Brief Progress Note Patient Name: Keith Dixon DOB: Mar 15, 1961 MRN: [id number]   Date of Service  12/25/2020  HPI/Events of Note  Notified of borderline blood pressure SBP 80s  eICU Interventions  Will give a trial of albumin 5% Patient is for possible dialysis in am     Intervention Category Major Interventions: Hypotension - evaluation and management  Judd Lien 12/25/2020, 3:30 AM

## 2020-12-25 NOTE — Procedures (Signed)
Diagnostic Bronchoscopy  KAMRYN BENSHOOF  [id number]  31-Dec-1960  Date:12/25/20  Time:2:36 PM   Provider Performing:Chaska Hagger R Aleea Hendry   Procedure: Diagnostic Bronchoscopy [id number])  Indication(s) Assist with direct visualization of tracheostomy placement  Consent Risks of the procedure as well as the alternatives and risks of each were explained to the patient and/or caregiver.  Consent for the procedure was obtained.   Anesthesia See separate tracheostomy note   Time Out Verified patient identification, verified procedure, site/side was marked, verified correct patient position, special equipment/implants available, medications/allergies/relevant history reviewed, required imaging and test results available.   Sterile Technique Usual hand hygiene, masks, gowns, and gloves were used   Procedure Description Bronchoscope advanced through endotracheal tube and into airway.  After suctioning out tracheal secretions, bronchoscope used to provide direct visualization of tracheostomy placement.   Complications/Tolerance None; patient tolerated the procedure well.   EBL None  Specimen(s) None   [name] Alliyah Roesler, PA-C

## 2020-12-25 NOTE — Procedures (Signed)
Percutaneous Tracheostomy Procedure Note   Keith Dixon  [id number]  [birth year]  Date:12/25/20  Time:4:03 PM   Provider Performing:Graciano Batson  Procedure: Percutaneous Tracheostomy with Bronchoscopic Guidance ([id number])  Indication(s) Prolonged mechanical ventilation  Consent Risks of the procedure as well as the alternatives and risks of each were explained to the patient and/or caregiver.  Consent for the procedure was obtained.  Anesthesia Etomidate, Versed, Fentanyl, Vecuronium   Time Out Verified patient identification, verified procedure, site/side was marked, verified correct patient position, special equipment/implants available, medications/allergies/relevant history reviewed, required imaging and test results available.   Sterile Technique Maximal sterile technique including sterile barrier drape, hand hygiene, sterile gown, sterile gloves, mask, hair covering.    Procedure Description Appropriate anatomy identified by palpation.  Patient's neck prepped and draped in sterile fashion.  1% lidocaine with epinephrine was used to anesthetize skin overlying neck.  1.5cm incision made and blunt dissection performed until tracheal rings could be easily palpated.   Then a size 6 cuffed Shiley tracheostomy was placed under bronchoscopic visualization using usual Seldinger technique and serial dilation.   Bronchoscope confirmed placement above the carina.  Tracheostomy was sutured in place with adhesive pad to protect skin under pressure.    Patient connected to ventilator.   Complications/Tolerance None; patient tolerated the procedure well. Chest X-ray is ordered to confirm no post-procedural complication.   EBL Minimal   Specimen(s) None   [name], MD [facility] ICU Physician [place]  Pager: [phone] Or Epic Secure Chat After hours: [phone].  12/25/2020, 4:03 PM

## 2020-12-25 NOTE — Progress Notes (Signed)
NAME:  Keith Dixon, MRN:  [id number], DOB:  04/23/1961, LOS: 60 ADMISSION DATE:  12/12/2020, CONSULTATION DATE:  12/25/20  REFERRING MD:  Grandville Silos, CHIEF COMPLAINT:   respiratory depression  History of Present Illness:  Keith Dixon is a 60 y/o M with PMH significant for severe degenerative scoliosis with multi-level listhesis, anxiety, bipolar disorder, PTSD, seizures, OSA and tobacco use who was admitted to neurosurgery for L1-2, L2-3, L3-4, L4-5 DLIFs initially and subsequent T10-pelvis decompression and fusion.   His initial surgery was 8/3 and was slow to wake up, after second  surgery 8/4  (10hr procedure) pt was again slow to awaken post-anesthesia, so decision was made to leave patient intubated overnight.    Significant Hospital Events:  8/3 Admit to neurosurgery, underwent  anterior lateral Interbody fusion of L1-L5, notably had difficult foley insertion requiring urethral dilation 8/4 Stage 2 posterior decompression and fusion, left intubated post-procedure and PCCM consulted 8/5 AKI seen on am labs with evidence of volume depletion on POC [place] 8/6: Patient AKI was due to acute rhabdomyolysis in the setting of prolonged surgery, he remained oliguric.  Ultrasound lower extremity showed acute right lower extremity DVT and left IJ DVT, IVC filter placed 8/7: Patient serum creatinine continues to trend up, he remained oliguric, started on CRRT Head CT showed small multiple bilateral strokes.  EEG was placed  8/9>. No seizures per EEG, remains on Levo, Propofol and fentanyl with good vent synchrony. Remains on CVVH with UF goal of 50 cc per hour, but increasing pressor needs this am  8/11: off pressors, mental status precluding extubation 8/12: Tunneled HD line placement 8/13: Tolerating some PSV on fentanyl 8/13 MRI/A brain > multiple acute/subacute CVA in the cerebellum, left > right thalami, bilateral cerebral hemispheres.  No abnormal findings in the neck, normal angiography 8/14  Began HD 8/15 Extubated and declined to due to stridor and required reintubation.  8/16 Hgb 6.7 transfused 1u pRBC likely from actively draining serosanguinous fluid from surgical site on back.   Interim History / Subjective:  Failed trial extubation and developed stridor. Reintubated on 8/15 and verified site with CXR.  Hgb 6.7 this morning. Surgical site on back draining serosaguineous fluid.   Objective   Blood pressure 126/65, pulse 79, temperature 98.2 F (36.8 C), temperature source Axillary, resp. rate 16, height 6' (1.829 m), weight 103.3 kg, SpO2 100 %.    Vent Mode: PRVC FiO2 (%):  [40 %-80 %] 40 % Set Rate:  [16 bmp] 16 bmp Vt Set:  [620 mL] 620 mL PEEP:  [5 cmH20] 5 cmH20 Plateau Pressure:  [14 cmH20-20 cmH20] 14 cmH20   Intake/Output Summary (Last 24 hours) at 12/25/2020 1348 Last data filed at 12/25/2020 1252 Gross per 24 hour  Intake 3431.08 ml  Output 1250 ml  Net 2181.08 ml    Filed Weights   12/23/20 1845 12/24/20 0454 12/24/20 0700  Weight: 107.7 kg 103.3 kg 103.3 kg   Physical Exam  General: Ill-appearing gentleman, obese, now extubated HEENT: NCAT, pupils equal.   CV: intermittently tachycardic with normal rhythm, no murmurs.  PULM: tachypneic on 8L HFNC s/p extubation. Coarse bilateral breath sounds, no wheezes.  GI: Nondistended, bowel sounds present. Skin: Actively spurting serosanguinous fluid from surgical site on back. Pressure dressings on bilateral heels.   Neuro: Eyes open and has movement of bilateral upper and lower extremities.    Assessment & Plan:   Acute post operative hypoxic and hypercapnic respiratory respiratory failure Unfortunately developed stridor after extubation so  patient was reintubated on 8/15. Discussed with family and agreed that he needs more time on the ventilator and we will pursue tracheostomy.  -Continue SBT, PSV as he can tolerate -VAP prevention orders  Acute encephalopathy, agitation Will reassess now extubated  and off sedation.  -Continue Seroquel, consider uptitrate  Acute oliguric renal failure: secondary to rhabdomyolysis and ATN this admission.  -Appreciate nephrology management -Intermittent HD as per nephrology plans, completed first HD on 8/14   Acute on chronic anemia. Serosanguinous fluid actively draining from surgical site likely representing a seroma. Hgb 6.7 on 8/16.  -Discuss with neurosurgery -Following CBC  Transient fever  -Continue to follow WBC, clinical status.  Plan to repeat cultures if fever recurs  Acute right lower extremity DVT, acute left IJ DVT s/p IVC filter placement -Continue Heparin infusion, targeting lower end of therapeutic range until okay to increase per neurosurgery plans   Acute bilateral cortical and subcortical MCA/ACA watershed territory infarcts Ddx post operative hypotension vs embolic source. No embolic source identified--no thrombus on echocardiogram, TCD negative for PFO. Unfortunately, renal failure is precluding CTA for further evaluation for intracranial stenosis.  -Appreciate neurology assistance -Heparin infusion as ordered  Hypertension -Continue amlodipine  Hx of seizure disorder. No epileptic activity on EEG -Continue home Keppra and Lamictal  Lumbar spinal stenosis s/p decompression and fusion -Pain control -Antibiotics completed  High risk malnutrition -Continue tube feeding  Deep pressure injury of left heel. -Continue wound care  Chest hematoma.  Does not appear to be expanding on heparin fusion -Following  Best Practice    Diet/type: NPO, TF DVT prophylaxis: heparin gtt GI prophylaxis: PPI Lines: Right IJ, Left femoral HD line, Right Radial A-line, Right peripheral IV, NG.  Foley: Removed 8/11 Code Status:  full code Last date of multidisciplinary goals of care discussion: 8/11 patient's family was updated at bedside  Independent critical care time 34 minutes  [place] Service II, MS4

## 2020-12-25 NOTE — Progress Notes (Signed)
Turned pt to look at incision on back, incision started spurting serosanguineous fluid, pressure applied and spurting stopped.  Incision still leaking.  Dr. Lynetta Mare at bedside and Dr. Reatha Armour notified.  Heparin stopped per Dr. Lynetta Mare.

## 2020-12-26 DIAGNOSIS — J9602 Acute respiratory failure with hypercapnia: Secondary | ICD-10-CM | POA: Diagnosis not present

## 2020-12-26 DIAGNOSIS — R410 Disorientation, unspecified: Secondary | ICD-10-CM | POA: Diagnosis not present

## 2020-12-26 DIAGNOSIS — J9601 Acute respiratory failure with hypoxia: Secondary | ICD-10-CM | POA: Diagnosis not present

## 2020-12-26 LAB — GLUCOSE, CAPILLARY
Glucose-Capillary: 111 mg/dL — ABNORMAL HIGH (ref 70–99)
Glucose-Capillary: 120 mg/dL — ABNORMAL HIGH (ref 70–99)
Glucose-Capillary: 113 mg/dL — ABNORMAL HIGH (ref 70–99)
Glucose-Capillary: 119 mg/dL — ABNORMAL HIGH (ref 70–99)
Glucose-Capillary: 118 mg/dL — ABNORMAL HIGH (ref 70–99)
Glucose-Capillary: 131 mg/dL — ABNORMAL HIGH (ref 70–99)

## 2020-12-26 LAB — BPAM RBC
Blood Product Expiration Date: 202209072359
ISSUE DATE / TIME: 202208161034
Unit Type and Rh: 6200

## 2020-12-26 LAB — TYPE AND SCREEN
ABO/RH(D): A POS
Antibody Screen: NEGATIVE
Unit division: 0

## 2020-12-26 LAB — RENAL FUNCTION PANEL
Albumin: 2.1 g/dL — ABNORMAL LOW (ref 3.5–5.0)
Albumin: 2.4 g/dL — ABNORMAL LOW (ref 3.5–5.0)
Anion gap: 11 (ref 5–15)
Anion gap: 10 (ref 5–15)
BUN: 123 mg/dL — ABNORMAL HIGH (ref 6–20)
BUN: 76 mg/dL — ABNORMAL HIGH (ref 6–20)
CO2: 23 mmol/L (ref 22–32)
CO2: 25 mmol/L (ref 22–32)
Calcium: 9.3 mg/dL (ref 8.9–10.3)
Calcium: 9.2 mg/dL (ref 8.9–10.3)
Chloride: 106 mmol/L (ref 98–111)
Chloride: 104 mmol/L (ref 98–111)
Creatinine, Ser: 6.53 mg/dL — ABNORMAL HIGH (ref 0.61–1.24)
Creatinine, Ser: 4.62 mg/dL — ABNORMAL HIGH (ref 0.61–1.24)
GFR, Estimated: 9 mL/min — ABNORMAL LOW (ref >60)
GFR, Estimated: 14 mL/min — ABNORMAL LOW (ref >60)
Glucose, Bld: 128 mg/dL — ABNORMAL HIGH (ref 70–99)
Glucose, Bld: 107 mg/dL — ABNORMAL HIGH (ref 70–99)
Phosphorus: 6.2 mg/dL — ABNORMAL HIGH (ref 2.5–4.6)
Phosphorus: 4.7 mg/dL — ABNORMAL HIGH (ref 2.5–4.6)
Potassium: 4.9 mmol/L (ref 3.5–5.1)
Potassium: 4.6 mmol/L (ref 3.5–5.1)
Sodium: 140 mmol/L (ref 135–145)
Sodium: 139 mmol/L (ref 135–145)

## 2020-12-26 LAB — MAGNESIUM: Magnesium: 2.8 mg/dL — ABNORMAL HIGH (ref 1.7–2.4)

## 2020-12-26 LAB — CBC
HCT: 26 % — ABNORMAL LOW (ref 39.0–52.0)
Hemoglobin: 8.2 g/dL — ABNORMAL LOW (ref 13.0–17.0)
MCH: 27.2 pg (ref 26.0–34.0)
MCHC: 31.5 g/dL (ref 30.0–36.0)
MCV: 86.1 fL (ref 80.0–100.0)
Platelets: 311 10*3/uL (ref 150–400)
RBC: 3.02 MIL/uL — ABNORMAL LOW (ref 4.22–5.81)
RDW: 19.1 % — ABNORMAL HIGH (ref 11.5–15.5)
WBC: 13.4 10*3/uL — ABNORMAL HIGH (ref 4.0–10.5)
nRBC: 0 % (ref 0.0–0.2)

## 2020-12-26 LAB — APTT: aPTT: 52 seconds — ABNORMAL HIGH (ref 24–36)

## 2020-12-26 MED ORDER — HEPARIN (PORCINE) 25000 UT/250ML-% IV SOLN
2700.0000 [IU]/h | INTRAVENOUS | Status: DC
  Administered 2020-12-26 – 2020-12-31 (×11): 2700 [IU]/h via INTRAVENOUS
  Filled 2020-12-26 (×11): qty 250

## 2020-12-26 MED ORDER — AMLODIPINE BESYLATE 2.5 MG PO TABS
2.5000 mg | ORAL_TABLET | Freq: Every day | ORAL | Status: DC
  Administered 2020-12-26 – 2021-01-08 (×14): 2.5 mg
  Filled 2020-12-26 (×13): qty 1

## 2020-12-26 MED ORDER — ALBUTEROL SULFATE (2.5 MG/3ML) 0.083% IN NEBU
2.5000 mg | INHALATION_SOLUTION | Freq: Three times a day (TID) | RESPIRATORY_TRACT | Status: DC
  Administered 2020-12-27 – 2020-12-29 (×7): 2.5 mg via RESPIRATORY_TRACT
  Filled 2020-12-26 (×7): qty 3

## 2020-12-26 MED ORDER — LEVETIRACETAM 100 MG/ML PO SOLN
500.0000 mg | Freq: Once | ORAL | Status: AC
  Administered 2020-12-26: 500 mg
  Filled 2020-12-26: qty 5

## 2020-12-26 MED ORDER — HEPARIN SODIUM (PORCINE) 1000 UNIT/ML IJ SOLN: 1000.0000 [IU] | INTRAMUSCULAR | Status: DC | PRN

## 2020-12-26 MED ORDER — AMLODIPINE BESYLATE 2.5 MG PO TABS
2.5000 mg | ORAL_TABLET | Freq: Every day | ORAL | Status: DC
  Filled 2020-12-26: qty 1

## 2020-12-26 MED ORDER — HEPARIN SODIUM (PORCINE) 1000 UNIT/ML IJ SOLN
INTRAMUSCULAR | Status: AC
  Administered 2020-12-26: 3200 [IU] via INTRAVENOUS
  Filled 2020-12-26: qty 4

## 2020-12-26 NOTE — Procedures (Signed)
Patient seen on Hemodialysis. BP (!) 173/91   Pulse 100   Temp 98.6 F (37 C) (Oral)   Resp (!) 25   Ht 6' (1.829 m)   Wt 112.8 kg   SpO2 98%   BMI 33.73 kg/m   QB 350, UF goal 3L Tolerating treatment without problems at this time.   Elmarie Shiley MD [facility]. Office # [phone] Pager # [phone] 8:52 AM

## 2020-12-26 NOTE — Progress Notes (Signed)
Patient placed on PS 5 CPAP 5 by CCM.

## 2020-12-26 NOTE — Progress Notes (Signed)
Patient ID: Keith Dixon, male   DOB: 1960/11/02, 60 y.o.   MRN: [id number] STROKE TEAM PROGRESS NOTE   INTERVAL HISTORY Patient had tracheostomy for respiratory failure and is breathing well and weaning off ventilatory support.  He is awake and interactive he follows simple commands. .  He has good antigravity strength on the left.  He continues to have right hemiparesis.  Vital signs are stable.  Neurological exam is unchanged.  He is currently getting hemodialysis Vitals:   12/26/20 1100 12/26/20 1137 12/26/20 1200 12/26/20 1300  BP:  (!) (P) 194/98 (!) 149/86   Pulse: (!) 105 82 83 92  Resp: (!) 33 (!) 30 18 (!) 26  Temp:   98.9 F (37.2 C)   TempSrc:   Oral   SpO2: 97% 98% 99% 99%  Weight:      Height:       CBC:  Recent Labs  Lab 12/25/20 0746 12/25/20 1504 12/26/20 0639  WBC 11.0*  --  13.4*  HGB 6.7* 8.0* 8.2*  HCT 22.1* 24.1* 26.0*  MCV 90.6  --  86.1  PLT 216  --  [id number]   Basic Metabolic Panel:  Recent Labs  Lab 12/25/20 0608 12/25/20 1504 12/26/20 0639  NA 140 138 140  K 5.2* 5.2* 4.9  CL 106 105 106  CO2 '22 24 23  '$ GLUCOSE 113* 108* 128*  BUN 112* 118* 123*  CREATININE 6.93* 6.90* 6.53*  CALCIUM 9.3 9.0 9.3  MG 2.8*  --  2.8*  PHOS 5.2* 6.8* 6.2*   Lipid Panel:  Recent Labs  Lab 12/20/20 0339  TRIG 213*   HgbA1c:  No results for input(s): HGBA1C in the last 168 hours.   IMAGING past 24 hours DG CHEST PORT 1 VIEW  Result Date: 12/25/2020 CLINICAL DATA:  History of tracheostomy EXAM: PORTABLE CHEST 1 VIEW COMPARISON:  12/24/2020 FINDINGS: Tracheostomy tube tip about 3.8 cm superior to carina. Low lung volumes. Borderline cardiomegaly with vascular congestion. Worsened opacity right base. No pneumothorax. Right-sided central venous catheter tip over the cavoatrial region. Esophageal tube tip below the diaphragm but incompletely visualized IMPRESSION: 1. Interval placement of tracheostomy tube as above. 2. Low lung volumes. Worsened airspace disease at  the right base which may reflect atelectasis or pneumonia 3. Borderline cardiomegaly with vascular congestion Electronically Signed   By: Donavan Foil M.D.   On: 12/25/2020 18:34     Physical Exam   Temp:  [97.9 F (36.6 C)-99 F (37.2 C)] 98.9 F (37.2 C) (08/17 1200) Pulse Rate:  [78-105] 92 (08/17 1300) Resp:  [0-33] 26 (08/17 1300) BP: (122-185)/(58-137) 149/86 (08/17 1200) SpO2:  [94 %-100 %] 99 % (08/17 1300) FiO2 (%):  [28 %-40 %] 28 % (08/17 1137) Weight:  [109.8 kg-112.8 kg] 109.8 kg (08/17 0956)  General -obese middle-aged African-American male mild respiratory distress postextubation Ophthalmologic - fundi not visualized due to noncooperation.  Cardiovascular - Regular rate and rhythm.  Neuro -patient extubated.  Is drowsy but can be aroused easily follows simple one-step midline commands.  Eyes midline, not blinking to visual threat, +doll's eyes. Eyes are disconjugate with skew eye deviation with right high hypotropia Exotropia.  Mild right lower facial weakness when he smiles.  Tongue protrusion midline.  Spontaneous antigravity movements on the left side.  Right hemiparesis with right upper extremity 2/5 and right lower extremity 2-3/5 strength  DTR diminished and no babinski. Sensation, coordination and gait not tested.     Assessment and Plan  Keith Paolini  Dixon is a 60 y.o. male with PMH significant for PTSD, OSA, HTN, tobacco use, ?seizures, bipolar, degenerativse scoliosis who underwent extensive T10-pelvis decompression and fusion over 2 days(8/4 and 8/5) and difficulty waking up after surgery. Also complicated by Aki, rhabdomyolysis, DVT in RLE and left IJ s/p IVC filter and poor mentation/encephalopathy. Workup with CTH demonstrated embolic appearing strokes and neurology consulted.  #Stroke: bilateral cortical MCA/ACA watershed cortical as well as subcortical left thalamic infarcts, concerning for perioperative hypotension in the setting of intracranial stenosis.  Can not rule out cardioembolic source CT head Small recent appearing bilateral cortical infarcts, left thalamic and probable bilateral cerebellar infarcts.  MRI  unable d/t hardware and CRRT with multiple lines of meds Carotid Doppler w/LICA [id number] % stenosis, RICA not able to sonicate due to central line CTA Head on 09/11/20 with no significant intracranial stenosis MRI brain watershed infarcts in cerebellum, thalami. MRA head and neck Neg. 2D Echo w/EF 65 to 70 %, LA normal in size  BLE doppler showed right LE DVT TCD with Bubble was negative for PFO  TCD  mild elevated velocity of left MCA, right MCA and right ICA siphon, mild  stenosis in these vessels. LDL 30 HgbA1c 5.8 VTE prophylaxis - heparin IV No antithrombotic prior to admission, now on heparin IV given DVT. Continue heparin IV now given possible trach soon Therapy recommendations:  pending  #Seizure History  LTM this admission did not show any seizures or epileptiform discharges.  LTM d/c 8/9 Continue with Lamictal 100 mg BID and Keppra 1000 mg BID.   DVT LE venous doppler showed right LE DVT Also left IJ thrombosis found during line placement S/p IVC filter Now on heparin IV  AKI with renal failure Cre 6.74->4.91->4.12->3.37->2.83->2.67->3.06 Was on CRRT -> off now Placed HD catheter  Likely HD tomorrow Nephrology on board  Severe anemia Hb 7.5->6.1-> PRBC->7.2->9.3->9.1->8.8 Continue CBC monitoring Continue heparin IV for now  Extensive back surgery Dr. Marcello Moores on board Status post surgery 8/4 and 8/5 Drainage removed 8/9  Respiratory failure Intubated on vent Off propofol Still on fentanyl CCM on board May need tracheostomy - continue heparin IV for now  #Hypertension Home meds:  norvasc Stable Long-term BP goal normotensive  Hyperlipidemia Home meds:  none  LDL 30, goal < 70 No statin needed at the time given LDL at goal  Tobacco abuse Current smoker Smoking cessation counseling will be  provided when he is more alert.  Other Stroke Risk Factors ETOH use, alcohol level <10, advised to drink no more than 2 drink(s) a day Substance abuse - UDS:  reported THC use Obesity, Body mass index is 32.83 kg/m., BMI >/= 30 associated with increased stroke risk, recommend weight loss, diet and exercise as appropriate   Other Active Problems B12 deficiency, supplement Hypocalcemia   Hospital day # 14  Patient had tracheostomy yesterday for respiratory failure.  No need for TEE to look for cardiac source of embolism or need for looking head prolonged cardiac monitoring after discharge for paroxysmal A. fib.  as patient already has an indication for anticoagulation due to his DVT.  Continue IV heparin for DVT and for stroke prevention and change to Eliquis once he has a PEG tube.  Recommend PEG tube for long-term nutritional needs as patient is unlikely to be able to swallow soon.  Discussed with Dr. Lynetta Mare critical care medicine.  And Dr. Reatha Armour neurosurgery This patient is critically ill and at significant risk of neurological worsening, death and care requires constant  monitoring of vital signs, hemodynamics,respiratory and cardiac monitoring, extensive review of multiple databases, frequent neurological assessment, discussion with family, other specialists and medical decision making of high complexity.I have made any additions or clarifications directly to the above note.This critical care time does not reflect procedure time, or teaching time or supervisory time of PA/NP/Med Resident etc but could involve care discussion time.  I spent 30 minutes of neurocritical care time  in the care of  this patient. Stroke team will sign off.  Kindly call for questions.     Antony Contras, MD 12/26/2020 2:29 PM   To contact Stroke Continuity provider, please refer to [email or web address] After hours, contact General Neurology

## 2020-12-26 NOTE — Progress Notes (Signed)
   Providing Compassionate, Quality Care - Together  NEUROSURGERY PROGRESS NOTE   S: prevena wound dressing/vac applied.  O: EXAM:  BP 133/85   Pulse 93   Temp 99.9 F (37.7 C)   Resp (!) 24   Ht 6' (1.829 m)   Wt 109.8 kg   SpO2 97%   BMI 32.83 kg/m   Awake, alert PERRL Dysconjugate gaze Trach in place, appears more comfortable MAE equally FC x4 Mouthing words  - wound approximated, no erythema, expressed small about of serous fluid from superior portion of incision. Prevena wound vac applied and is working well.   ASSESSMENT:  60 y.o. male with    Degenerative Scoliosis with severe stenosis  AKI  VDRF, s/p trach 12/25/2020  Acute blood loss anemia, expected     - status post stage I LLIF L1-L5 on 12/12/2020 - status post stage II posterior instrumentation T10-pelvis, L2- 5 PCOs, L5-S1 TLIF on 12/13/2020   PLAN: - continue supportive care - hemodialysis per Nephrology - neurology following for CVAs - likely due to hypotensive episodes - daily dressing check, Q2h log rolling - heparin drip for acute DVT, status post IVC filter  - tube feeds, may need peg pending progress   Thank you for allowing me to participate in this patient's care.  Please do not hesitate to call with questions or concerns.   Elwin Sleight, [facility] Cell: [phone]

## 2020-12-26 NOTE — Progress Notes (Signed)
[place] for IV Heparin Indication: DVT  No Known Allergies  Patient Measurements: Height: 6' (182.9 cm) Weight: 109.8 kg (242 lb 1 oz) IBW/kg (Calculated) : 77.6 Heparin Dosing Weight: 99 kg   Vital Signs: Temp: 99.9 F (37.7 C) (08/17 1600) Temp Source: Oral (08/17 1200) BP: 133/85 (08/17 1700) Pulse Rate: 93 (08/17 1600)  Labs: Recent Labs    12/24/20 0345 12/24/20 1359 12/24/20 1637 12/25/20 0434 12/25/20 0608 12/25/20 0746 12/25/20 1504 12/26/20 0639 12/26/20 1757  HGB 7.0*   < >  --   --   --  6.7* 8.0* 8.2*  --   HCT 21.9*   < >  --   --   --  22.1* 24.1* 26.0*  --   PLT 195  --   --   --   --  216  --  311  --   APTT 48*  --  38* 59*  --   --   --   --  52*  LABPROT  --   --   --  14.8  --   --   --   --   --   INR  --   --   --  1.2  --   --   --   --   --   CREATININE 5.75*  --  6.39*  --    < >  --  6.90* 6.53* 4.62*   < > = values in this interval not displayed.    Estimated Creatinine Clearance: 22.3 mL/min (A) (by C-G formula based on SCr of 4.62 mg/dL (H)).  Assessment: 60 yr old male with RLE DVT, S/P recent lumbar fusion.  Patient continues on IV heparin and was transitioned to [facility] per nephrology on 8/14.   Heparin IV was on hold for trach on 8/16. Of note, incision on back was draining serous fluid on 8/16.  aPTT ~6 hrs after heparin infusion was restarted earlier today at 2700 units/hr was 52 sec, which is at the low end of the goal range for this pt. H/H 8.2/26.0, plt 311. Per RN, no issues with IV or bleeding.  Goal of Therapy:  aPTT 50-75 seconds per neurosurgery Monitor platelets by anticoagulation protocol: Yes   Plan:  Continue heparin infusion at 2700 units/hr  Check confirmatory aPTT in 8 hrs Monitor daily aPTT, CBC Monitor closely for any signs/symptoms of bleeding near incision site  Gillermina Hu, PharmD, BCPS, [facility] Clinical Pharmacist 12/26/2020 7:50

## 2020-12-26 NOTE — Progress Notes (Signed)
   Providing Compassionate, Quality Care - Together  NEUROSURGERY PROGRESS NOTE   S: No issues overnight. Currently on HD without issue  O: EXAM:  BP (!) 173/91   Pulse 100   Temp 98.6 F (37 C) (Oral)   Resp (!) 25   Ht 6' (1.829 m)   Wt 112.8 kg   SpO2 98%   BMI 33.73 kg/m   Awake, alert PERRL Dysconjugate gaze Trach in place, appears more comfortable MAE equally FC x4 Mouthing words   ASSESSMENT:  60 y.o. male with   Degenerative Scoliosis with severe stenosis  AKI  VDRF, s/p trach 12/25/2020  Acute blood loss anemia, expected     - status post stage I LLIF L1-L5 on 12/12/2020 - status post stage II posterior instrumentation T10-pelvis, L2- 5 PCOs, L5-S1 TLIF on 12/13/2020   PLAN: - continue supportive care - hemodialysis per Nephrology - could not assess wound this am due to HD, will attempt later - daily dressing changes of thoracolumbar wound, Q2h log rolling - heparin drip for acute DVT, status post IVC filter    Thank you for allowing me to participate in this patient's care.  Please do not hesitate to call with questions or concerns.   Elwin Sleight, [facility] Cell: [phone]

## 2020-12-26 NOTE — Progress Notes (Signed)
NAME:  Keith Dixon, MRN:  [id number], DOB:  [birth year], LOS: 21 ADMISSION DATE:  12/12/2020, CONSULTATION DATE:  12/26/20  REFERRING MD:  Grandville Silos, CHIEF COMPLAINT:   respiratory depression  History of Present Illness:  Keith Dixon is a 60 y/o M with PMH significant for severe degenerative scoliosis with multi-level listhesis, anxiety, bipolar disorder, PTSD, seizures, OSA and tobacco use who was admitted to neurosurgery for L1-2, L2-3, L3-4, L4-5 DLIFs initially and subsequent T10-pelvis decompression and fusion.   His initial surgery was 8/3 and was slow to wake up, after second  surgery 8/4  (10hr procedure) pt was again slow to awaken post-anesthesia, so decision was made to leave patient intubated overnight.    Significant Hospital Events:  8/3 Admit to neurosurgery, underwent  anterior lateral Interbody fusion of L1-L5, notably had difficult foley insertion requiring urethral dilation 8/4 Stage 2 posterior decompression and fusion, left intubated post-procedure and PCCM consulted 8/5 AKI seen on am labs with evidence of volume depletion on POC [place] 8/6: Patient AKI was due to acute rhabdomyolysis in the setting of prolonged surgery, he remained oliguric.  Ultrasound lower extremity showed acute right lower extremity DVT and left IJ DVT, IVC filter placed 8/7: Patient serum creatinine continues to trend up, he remained oliguric, started on CRRT Head CT showed small multiple bilateral strokes.  EEG was placed  8/9>. No seizures per EEG, remains on Levo, Propofol and fentanyl with good vent synchrony. Remains on CVVH with UF goal of 50 cc per hour, but increasing pressor needs this am  8/11: off pressors, mental status precluding extubation 8/12: Tunneled HD line placement 8/13: Tolerating some PSV on fentanyl 8/13 MRI/A brain > multiple acute/subacute CVA in the cerebellum, left > right thalami, bilateral cerebral hemispheres.  No abnormal findings in the neck, normal angiography 8/14  Began HD 8/15 Extubated and declined to due to stridor and required reintubation.  8/16 S/p tracheostomy. Hgb 6.7 transfused 1u pRBC likely from actively draining serosanguinous fluid from surgical site on back. NSGY expressed fluid from surgical site until resolution. 8/17 UOP improved  Interim History / Subjective:  S/p tracheostomy 8/16. NSGY team expressed majority of serous fluid from wound 8/16 morning. Hgb 6.7 -> 8.0 s/p 1u pRBC.  UOP improved.  0.7 cc/kg/hr Completed dialysis 8/17 morning. Objective   Blood pressure (!) 177/93, pulse 94, temperature 98.8 F (37.1 C), resp. rate (!) 21, height 6' (1.829 m), weight 112.8 kg, SpO2 98 %.    Vent Mode: PSV;CPAP FiO2 (%):  [30 %-40 %] 30 % Set Rate:  [16 bmp] 16 bmp Vt Set:  [620 mL] 620 mL PEEP:  [5 cmH20] 5 cmH20 Pressure Support:  [12 cmH20] 12 cmH20 Plateau Pressure:  [14 cmH20-20 cmH20] 19 cmH20   Intake/Output Summary (Last 24 hours) at 12/26/2020 0927 Last data filed at 12/26/2020 0700 Gross per 24 hour  Intake 1541.6 ml  Output 1800 ml  Net -258.4 ml    Filed Weights   12/24/20 0454 12/24/20 0700 12/26/20 0630  Weight: 103.3 kg 103.3 kg 112.8 kg   Physical Exam  General: Ill-appearing gentleman, obese, lying in bed.  HEENT: NCAT, trach in place on vent.  CV: normal rate and rhythm, no murmurs or gallops.  PULM: Decreased breath sounds bilaterally. CTAB, no wheezes or crackles.  GI: Nondistended, bowel sounds present. Skin: Pressure dressings on bilateral heels.   Neuro: Eyes open and has movement of bilateral upper and lower extremities. Follows commands.    Assessment & Plan:  Acute post operative hypoxic and hypercapnic respiratory failure S/p tracheostomy on vent. Tolerating PSV 12/5 with Sats 98% on FiO2 30%. CXR 8/17 with some right basilar atelectasis after trach. Will f/u tomorrow after continued ventilation today. -Continue SBT, PSV as he can tolerate -Will trial trach collar later today -F/u CXR on  8/17 -VAP prevention orders  Acute encephalopathy, agitation Will reassess now extubated and off sedation.  -Continue Seroquel, consider uptitrate  Acute oliguric renal failure: secondary to rhabdomyolysis and ATN this admission. UOP improved 8/17, may be ATN diuresis. Hope to get return of function with continued dialysis.  -Appreciate nephrology management -Intermittent HD as per nephrology plans,   Acute on chronic anemia. Serosanguinous fluid actively draining from surgical site likely representing a seroma which NSGY expressed fluid from surgical site. Hgb improved from 6.7 to 8.2 s/p 1u pRBC. -Following CBC  Acute right lower extremity DVT, acute left IJ DVT s/p IVC filter placement: Briefly held heparin on 8/16 for tracheostomy. Will resume today. -Continue Heparin infusion, targeting lower end of therapeutic range until okay to increase per neurosurgery plans   Acute bilateral cortical and subcortical MCA/ACA watershed territory infarcts Ddx post operative hypotension vs embolic source. No embolic source identified--no thrombus on echocardiogram, TCD negative for PFO. Unfortunately, renal failure is precluding CTA for further evaluation for intracranial stenosis.  -Appreciate neurology assistance -Heparin infusion as ordered  Hypertension Held amlodipine on 8/16 for low Bps, but now hypertensive on 8/17 up to 170s.  -Resume amlodipine 2.5 mg, will uptitrate if needed.   Hx of seizure disorder. No epileptic activity on EEG -Continue home Keppra and Lamictal  Lumbar spinal stenosis s/p decompression and fusion -Pain control -Antibiotics completed  High risk malnutrition -Continue tube feeding  Deep pressure injury of left heel. -Continue wound care  Chest hematoma.  Does not appear to be expanding on heparin fusion -Following  Best Practice    Diet/type: NPO, TF DVT prophylaxis: heparin gtt GI prophylaxis: PPI Lines: Right IJ, Left femoral HD line, Right Radial  A-line, Right peripheral IV, NG.  Foley: Removed 8/11 Code Status:  full code Last date of multidisciplinary goals of care discussion:  patient's family was updated at bedside  Independent critical care time 35 minutes  [place] Service II, MS4

## 2020-12-26 NOTE — Progress Notes (Signed)
[place] for heparin Indication: DVT  No Known Allergies  Patient Measurements: Height: 6' (182.9 cm) Weight: 112.8 kg (248 lb 10.9 oz) IBW/kg (Calculated) : 77.6  Vital Signs: Temp: 98.6 F (37 C) (08/17 0630) Temp Source: Oral (08/17 0630) BP: 169/82 (08/17 0730) Pulse Rate: 95 (08/17 0730)  Labs: Recent Labs    12/24/20 0345 12/24/20 1359 12/24/20 1637 12/25/20 0434 12/25/20 0608 12/25/20 0746 12/25/20 1504 12/26/20 0639  HGB 7.0*   < >  --   --   --  6.7* 8.0* 8.2*  HCT 21.9*   < >  --   --   --  22.1* 24.1* 26.0*  PLT 195  --   --   --   --  216  --  311  APTT 48*  --  38* 59*  --   --   --   --   LABPROT  --   --   --  14.8  --   --   --   --   INR  --   --   --  1.2  --   --   --   --   CREATININE 5.75*  --  6.39*  --  6.93*  --  6.90* 6.53*   < > = values in this interval not displayed.     Estimated Creatinine Clearance: 16 mL/min (A) (by C-G formula based on SCr of 6.53 mg/dL (H)).  Assessment: 60 yo male with RLE DVT s/p recent lumbar fusion.  Patient continues on IV heparin and transitioned to iHD per nephro on 8/14.   Heparin IV on hold for trach on 8/16. Of note, incision on back was draining serous fluid on 8/16.  Goal of Therapy:  aPTT 50-75 seconds.  Monitor platelets by anticoagulation protocol: Yes   Plan:  Will plan to restart heparin drip at 2700 units/hr this am.  Check aPTT 8 hours after start of infusion Plan for Qam aPTT and CBC if stable Monitor closely for any s/s of bleeding near incision site.    Vivi Barrack Reganne Messerschmidt 12/26/2020 7:50

## 2020-12-26 NOTE — Progress Notes (Signed)
Patient ID: Keith Dixon, male   DOB: 1961/02/22, 60 y.o.   MRN: [id number] [facility] Progress Note   Assessment/ Plan:   1.  Acute kidney injury: Appears to be from a combination of ATN and rhabdomyolysis/pigment nephropathy.  He suffered dense renal injury and has been nonoliguric overnight but without evidence of renal recovery on labs.  He is getting hemodialysis at this time for management of azotemia and some volume removal.  With robust urine output, will continue to monitor for renal recovery on subsequent labs. 2.  Acute postoperative hypoxic/hypercapnic respiratory failure: Status post tracheostomy and currently on ventilator support per CCM. 4.  Lumbar spine stenosis/scoliosis status post decompression and fusion surgery: Ongoing management/surveillance by neurosurgery. 5.  Shock: Secondary to distributive/hypovolemic mechanism; this is resolved and he is currently hypertensive. 6.  Acute blood loss anemia: Secondary to surgical associated losses/critical illness, downtrending of hemoglobin and hematocrit noted and will defer decision for PRBC transfusion to primary service. 7.  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  Subjective:   Unable to get dialysis yesterday because of hemodialysis nurse staffing shortage and is getting dialysis this morning.   Objective:   BP (!) 173/91   Pulse 100   Temp 98.6 F (37 C) (Oral)   Resp (!) 25   Ht 6' (1.829 m)   Wt 112.8 kg   SpO2 98%   BMI 33.73 kg/m   Intake/Output Summary (Last 24 hours) at 12/26/2020 0849 Last data filed at 12/26/2020 0700 Gross per 24 hour  Intake 1591.55 ml  Output 1800 ml  Net -208.45 ml   Weight change:   Physical Exam: Gen: Appears comfortable on ventilator via tracheostomy, on hemodialysis CVS: Pulse regular rhythm, tachycardic, S1 and S2 normal Resp: Anteriorly clear to auscultation, no rales/rhonchi.  Right IJ TDC connected to  dialysis Abd: Soft, obese, nontender, bowel sounds normal Ext: Trace lower extremity edema, 1+ upper extremity edema  Imaging: DG CHEST PORT 1 VIEW  Result Date: 12/25/2020 CLINICAL DATA:  History of tracheostomy EXAM: PORTABLE CHEST 1 VIEW COMPARISON:  12/24/2020 FINDINGS: Tracheostomy tube tip about 3.8 cm superior to carina. Low lung volumes. Borderline cardiomegaly with vascular congestion. Worsened opacity right base. No pneumothorax. Right-sided central venous catheter tip over the cavoatrial region. Esophageal tube tip below the diaphragm but incompletely visualized IMPRESSION: 1. Interval placement of tracheostomy tube as above. 2. Low lung volumes. Worsened airspace disease at the right base which may reflect atelectasis or pneumonia 3. Borderline cardiomegaly with vascular congestion Electronically Signed   By: Donavan Foil M.D.   On: 12/25/2020 18:34   DG CHEST PORT 1 VIEW  Result Date: 12/24/2020 CLINICAL DATA:  Intubation EXAM: PORTABLE CHEST 1 VIEW COMPARISON:  Portable exam 1227 hours compared to 12/24/2020 at 0521 hours FINDINGS: Tip of endotracheal tube approximately 13 mm above carina. Feeding tube extends into stomach. Two RIGHT jugular central venous catheters with tips projecting over SVC. Enlargement of cardiac silhouette with persistent elevation of RIGHT diaphragm. Decreased lung volumes with bibasilar atelectasis and probable LEFT perihilar infiltrate. No definite pleural effusion or pneumothorax. Prior thoracolumbar fusion. IMPRESSION: Decreased lung volumes greater on RIGHT with bibasilar atelectasis and suspected LEFT perihilar infiltrate. Electronically Signed   By: Lavonia Dana M.D.   On: 12/24/2020 13:20    Labs: BMET Recent Labs  Lab 12/23/20 0526 12/23/20 1601 12/23/20 2023 12/24/20 0345 12/24/20 1359 12/24/20 1637 12/25/20 0608 12/25/20 1504 12/26/20 0639  NA 138 138  --  137  137 137 140 138 140  K 5.7* 5.9* 4.8 4.9 5.4* 5.6* 5.2* 5.2* 4.9  CL 105 105  --   103  --  102 106 105 106  CO2 23 23  --  25  --  23 22 24 23   GLUCOSE 118* 123*  --  129*  --  125* 113* 108* 128*  BUN 109* 119*  --  92*  --  102* 112* 118* 123*  CREATININE 6.57* 6.92*  --  5.75*  --  6.39* 6.93* 6.90* 6.53*  CALCIUM 8.8* 9.1  --  8.7*  --  9.2 9.3 9.0 9.3  PHOS 4.5 5.0*  --  4.3  --  5.3* 5.2* 6.8* 6.2*   CBC Recent Labs  Lab 12/23/20 0526 12/24/20 0345 12/24/20 1359 12/25/20 0746 12/25/20 1504 12/26/20 0639  WBC 11.9* 10.3  --  11.0*  --  13.4*  HGB 7.4* 7.0* 10.5* 6.7* 8.0* 8.2*  HCT 22.8* 21.9* 31.0* 22.1* 24.1* 26.0*  MCV 86.7 88.0  --  90.6  --  86.1  PLT 233 195  --  216  --  311   Medications:     albuterol  2.5 mg Nebulization Q6H   alteplase  2 mg Intracatheter Once   aspirin  81 mg Per Tube Daily   bacitracin   Topical Daily   chlorhexidine gluconate (MEDLINE KIT)  15 mL Mouth Rinse BID   Chlorhexidine Gluconate Cloth  6 each Topical Daily   cholecalciferol  2,000 Units Per Tube BID   ciprofloxacin  1 drop Left Eye Q4H while awake   docusate  100 mg Per Tube BID   feeding supplement (PROSource TF)  45 mL Per Tube TID   folic acid  1 mg Per Tube Daily   gabapentin  300 mg Per Tube Daily   heparin sodium (porcine)       lamoTRIgine  100 mg Per Tube BID   levETIRAcetam  500 mg Per Tube BID   levETIRAcetam  500 mg Per Tube Q T,Th,Sat-1800   mouth rinse  15 mL Mouth Rinse 10 times per day   pantoprazole sodium  40 mg Per Tube QHS   polyethylene glycol  17 g Per Tube Daily   QUEtiapine  200 mg Per Tube QHS   sertraline  100 mg Per Tube QPC breakfast   sodium chloride flush  10-40 mL Intracatheter Q12H   sodium chloride flush  3 mL Intravenous Q12H   thiamine injection  100 mg Intravenous Daily   vitamin B-12  1,000 mcg Per Tube Daily   zinc sulfate  220 mg Per Tube Daily   Elmarie Shiley, MD 12/26/2020, 8:49 AM

## 2020-12-27 ENCOUNTER — Inpatient Hospital Stay (HOSPITAL_COMMUNITY): Payer: No Typology Code available for payment source

## 2020-12-27 DIAGNOSIS — R410 Disorientation, unspecified: Secondary | ICD-10-CM | POA: Diagnosis not present

## 2020-12-27 DIAGNOSIS — J9601 Acute respiratory failure with hypoxia: Secondary | ICD-10-CM | POA: Diagnosis not present

## 2020-12-27 DIAGNOSIS — J9602 Acute respiratory failure with hypercapnia: Secondary | ICD-10-CM | POA: Diagnosis not present

## 2020-12-27 LAB — RENAL FUNCTION PANEL
Albumin: 2.2 g/dL — ABNORMAL LOW (ref 3.5–5.0)
Albumin: 2.2 g/dL — ABNORMAL LOW (ref 3.5–5.0)
Anion gap: 12 (ref 5–15)
Anion gap: 11 (ref 5–15)
BUN: 78 mg/dL — ABNORMAL HIGH (ref 6–20)
BUN: 85 mg/dL — ABNORMAL HIGH (ref 6–20)
CO2: 25 mmol/L (ref 22–32)
CO2: 24 mmol/L (ref 22–32)
Calcium: 9.2 mg/dL (ref 8.9–10.3)
Calcium: 8.9 mg/dL (ref 8.9–10.3)
Chloride: 104 mmol/L (ref 98–111)
Chloride: 107 mmol/L (ref 98–111)
Creatinine, Ser: 4.6 mg/dL — ABNORMAL HIGH (ref 0.61–1.24)
Creatinine, Ser: 4.28 mg/dL — ABNORMAL HIGH (ref 0.61–1.24)
GFR, Estimated: 14 mL/min — ABNORMAL LOW (ref >60)
GFR, Estimated: 15 mL/min — ABNORMAL LOW (ref >60)
Glucose, Bld: 129 mg/dL — ABNORMAL HIGH (ref 70–99)
Glucose, Bld: 127 mg/dL — ABNORMAL HIGH (ref 70–99)
Phosphorus: 4.9 mg/dL — ABNORMAL HIGH (ref 2.5–4.6)
Phosphorus: 5.2 mg/dL — ABNORMAL HIGH (ref 2.5–4.6)
Potassium: 4.1 mmol/L (ref 3.5–5.1)
Potassium: 3.9 mmol/L (ref 3.5–5.1)
Sodium: 141 mmol/L (ref 135–145)
Sodium: 142 mmol/L (ref 135–145)

## 2020-12-27 LAB — MAGNESIUM: Magnesium: 2.3 mg/dL (ref 1.7–2.4)

## 2020-12-27 LAB — CBC
HCT: 25.4 % — ABNORMAL LOW (ref 39.0–52.0)
Hemoglobin: 8.1 g/dL — ABNORMAL LOW (ref 13.0–17.0)
MCH: 27.1 pg (ref 26.0–34.0)
MCHC: 31.9 g/dL (ref 30.0–36.0)
MCV: 84.9 fL (ref 80.0–100.0)
Platelets: 311 10*3/uL (ref 150–400)
RBC: 2.99 MIL/uL — ABNORMAL LOW (ref 4.22–5.81)
RDW: 18.3 % — ABNORMAL HIGH (ref 11.5–15.5)
WBC: 14 10*3/uL — ABNORMAL HIGH (ref 4.0–10.5)
nRBC: 0 % (ref 0.0–0.2)

## 2020-12-27 LAB — GLUCOSE, CAPILLARY
Glucose-Capillary: 119 mg/dL — ABNORMAL HIGH (ref 70–99)
Glucose-Capillary: 111 mg/dL — ABNORMAL HIGH (ref 70–99)
Glucose-Capillary: 119 mg/dL — ABNORMAL HIGH (ref 70–99)

## 2020-12-27 LAB — APTT: aPTT: 52 seconds — ABNORMAL HIGH (ref 24–36)

## 2020-12-27 MED ORDER — CHLORHEXIDINE GLUCONATE 0.12 % MT SOLN
OROMUCOSAL | Status: AC
  Filled 2020-12-27: qty 15

## 2020-12-27 NOTE — Progress Notes (Signed)
? ?  Inpatient Rehab Admissions Coordinator : ? ?Per therapy recommendations, patient was screened for CIR candidacy by Kaylia Winborne RN MSN.  At this time patient appears to be a potential candidate for CIR. I will place a rehab consult per protocol for full assessment. Please call me with any questions. ? ?Odalis Jordan RN MSN ?Admissions Coordinator ?[phone] ?  ?

## 2020-12-27 NOTE — Progress Notes (Signed)
NAME:  Keith Dixon, MRN:  [id number], DOB:  [birth year], LOS: 43 ADMISSION DATE:  12/12/2020, CONSULTATION DATE:  12/27/20  REFERRING MD:  Grandville Silos, CHIEF COMPLAINT:   respiratory depression  History of Present Illness:  Keith Dixon is a 60 y/o M with PMH significant for severe degenerative scoliosis with multi-level listhesis, anxiety, bipolar disorder, PTSD, seizures, OSA and tobacco use who was admitted to neurosurgery for L1-2, L2-3, L3-4, L4-5 DLIFs initially and subsequent T10-pelvis decompression and fusion.   His initial surgery was 8/3 and was slow to wake up, after second  surgery 8/4  (10hr procedure) pt was again slow to awaken post-anesthesia, so decision was made to leave patient intubated overnight.    Significant Hospital Events:  8/3 Admit to neurosurgery, underwent  anterior lateral Interbody fusion of L1-L5, notably had difficult foley insertion requiring urethral dilation 8/4 Stage 2 posterior decompression and fusion, left intubated post-procedure and PCCM consulted 8/5 AKI seen on am labs with evidence of volume depletion on POC [place] 8/6: Patient AKI was due to acute rhabdomyolysis in the setting of prolonged surgery, he remained oliguric.  Ultrasound lower extremity showed acute right lower extremity DVT and left IJ DVT, IVC filter placed 8/7: Patient serum creatinine continues to trend up, he remained oliguric, started on CRRT Head CT showed small multiple bilateral strokes.  EEG was placed  8/9>. No seizures per EEG, remains on Levo, Propofol and fentanyl with good vent synchrony. Remains on CVVH with UF goal of 50 cc per hour, but increasing pressor needs this am  8/11: off pressors, mental status precluding extubation 8/12: Tunneled HD line placement 8/13: Tolerating some PSV on fentanyl 8/13 MRI/A brain > multiple acute/subacute CVA in the cerebellum, left > right thalami, bilateral cerebral hemispheres.  No abnormal findings in the neck, normal angiography 8/14  Began HD 8/15 Extubated and declined to due to stridor and required reintubation.  8/16 S/p tracheostomy. Hgb 6.7 transfused 1u pRBC likely from actively draining serosanguinous fluid from surgical site on back. NSGY expressed fluid from surgical site until resolution. 8/17 UOP improving  Interim History / Subjective:  Tolerating trach collar, Sats 100% Fio2 28%, 5L/min. Objective   Blood pressure (!) 150/75, pulse 94, temperature 98.4 F (36.9 C), temperature source Oral, resp. rate (!) 33, height 6' (1.829 m), weight 109.8 kg, SpO2 100 %.    FiO2 (%):  [28 %] 28 %   Intake/Output Summary (Last 24 hours) at 12/27/2020 1103 Last data filed at 12/27/2020 0600 Gross per 24 hour  Intake 1532.39 ml  Output 925 ml  Net 607.39 ml    Filed Weights   12/24/20 0700 12/26/20 0630 12/26/20 0956  Weight: 103.3 kg 112.8 kg 109.8 kg   Physical Exam  General: Ill-appearing gentleman, obese, lying in bed.  HEENT: NCAT, trach with trach collar in place.   CV: normal rate and rhythm, no murmurs or gallops.  PULM:  CTAB, no wheezes or crackles. Decreased lung sounds bilaterally. Improved cough but with moderate amount of secretions during exam.  GI: Nondistended, non tender, bowel sounds present. Skin: Pressure dressings on bilateral heels.   Neuro: Eyes open, predominantly moving left side but will make movements bilaterally. Follows commands.    Assessment & Plan:  Acute post operative hypoxic and hypercapnic respiratory failure S/p tracheostomy on vent. Tolerating PSV 12/5 with Sats 98% on FiO2 30%. CXR 8/18 with improved right atelectasis. Continue with trach collar. Now on 28% FiO2 5L/min and Sats 100%.  -Continue trach collar as  tolerated. -SLP   Acute encephalopathy, agitation Hopefully will continue to improve now on trach collar and off sedation. -Continue Seroquel, consider uptitrate  Acute oliguric renal failure: secondary to rhabdomyolysis and ATN this admission. UOP improving, if  creatinine and UOP continue to improve, will hopefully not need dialysis soon. Defer to nephrology management. -Appreciate nephrology management -Intermittent HD as per nephrology plans  Hypertension Held amlodipine on 8/16 for low Bps, continues to be hypertensive. If still hypertensive tomorrow, will increase amlodipine to 5 mg. -Continue amlodipine 2.5 mg, will uptitrate if needed.   Acute on chronic anemia. Likely related to surgical losses, critical illness, and surgical wound site. Hgb stable.  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  -Appreciate neurology assistance -Heparin infusion as ordered -PT/OT  Acute right lower extremity DVT, acute left IJ DVT s/p IVC filter placement:  -Continue Heparin infusion, targeting lower end of therapeutic range until okay to increase per neurosurgery plans   Hx of seizure disorder. No epileptic activity on EEG -Continue home Keppra and Lamictal  Lumbar spinal stenosis s/p decompression and fusion -Pain control -Antibiotics completed  High risk malnutrition -Continue tube feeding  Deep pressure injury of left heel. -Continue wound care  Chest hematoma.  Does not appear to be expanding on heparin fusion -Following  Best Practice    Diet/type: NPO, TF DVT prophylaxis: heparin gtt GI prophylaxis: PPI Lines: Right IJ HD line, Left peripheral IV, Right peripheral IV, NG.  Foley: Removed 8/11 Code Status:  full code Last date of multidisciplinary goals of care discussion: updated family on 8/16  Independent critical care time 14 minutes  [place] Service [place], MS4

## 2020-12-27 NOTE — Progress Notes (Signed)
Subjective: Patient reports  remains on tracheostomy unable to vocalize   Objective: Vital signs in last 24 hours: Temp:  [98.4 F (36.9 C)-99.9 F (37.7 C)] 98.4 F (36.9 C) (08/18 0720) Pulse Rate:  [82-105] 91 (08/18 0737) Resp:  [12-33] 25 (08/18 0737) BP: (122-194)/(66-137) 130/66 (08/18 0737) SpO2:  [94 %-100 %] 100 % (08/18 0737) FiO2 (%):  [28 %-30 %] 28 % (08/18 0737) Weight:  [109.8 kg] 109.8 kg (08/17 0956)  Intake/Output from previous day: 08/17 0701 - 08/18 0700 In: 1752.4 [I.V.:487.4; NG/GT:1265] Out: 3925 [Urine:925] Intake/Output this shift: No intake/output data recorded.  Awake alert follows commands moves both extremities does seem to have slight increased weakness in the right  Lab Results: Recent Labs    12/26/20 0639 12/27/20 0234  WBC 13.4* 14.0*  HGB 8.2* 8.1*  HCT 26.0* 25.4*  PLT 311 311   BMET Recent Labs    12/26/20 1757 12/27/20 0235  NA 139 141  K 4.6 4.1  CL 104 104  CO2 25 25  GLUCOSE 107* 129*  BUN 76* 78*  CREATININE 4.62* 4.60*  CALCIUM 9.2 9.2    Studies/Results: DG CHEST PORT 1 VIEW  Result Date: 12/25/2020 CLINICAL DATA:  History of tracheostomy EXAM: PORTABLE CHEST 1 VIEW COMPARISON:  12/24/2020 FINDINGS: Tracheostomy tube tip about 3.8 cm superior to carina. Low lung volumes. Borderline cardiomegaly with vascular congestion. Worsened opacity right base. No pneumothorax. Right-sided central venous catheter tip over the cavoatrial region. Esophageal tube tip below the diaphragm but incompletely visualized IMPRESSION: 1. Interval placement of tracheostomy tube as above. 2. Low lung volumes. Worsened airspace disease at the right base which may reflect atelectasis or pneumonia 3. Borderline cardiomegaly with vascular congestion Electronically Signed   By: Donavan Foil M.D.   On: 12/25/2020 18:34    Assessment/Plan: Status post T10 to the pelvis with acute kidney injury, CVAs, ventilator dependence most likely secondary to  hypotensive episodes perioperatively.  Doing well tolerated trach collar last night neurologically improving had a wound VAC dressing placed yesterday evening continue dialysis and vent management per CCM.  LOS: 15 days     Elaina Hoops 12/27/2020, 7:41 AM

## 2020-12-27 NOTE — Progress Notes (Signed)
[place] for IV Heparin Indication: DVT  No Known Allergies  Patient Measurements: Height: 6' (182.9 cm) Weight: 109.8 kg (242 lb 1 oz) IBW/kg (Calculated) : 77.6 Heparin Dosing Weight: 99 kg   Vital Signs: Temp: 98.4 F (36.9 C) (08/18 0400) Temp Source: Axillary (08/18 0400) BP: 156/80 (08/18 0600) Pulse Rate: 93 (08/18 0600)  Labs: Recent Labs    12/25/20 0434 12/25/20 [id number] 12/25/20 0746 12/25/20 1504 12/26/20 0639 12/26/20 1757 12/27/20 0234 12/27/20 0235  HGB  --   --  6.7* 8.0* 8.2*  --  8.1*  --   HCT  --   --  22.1* 24.1* 26.0*  --  25.4*  --   PLT  --   --  216  --  311  --  311  --   APTT 59*  --   --   --   --  52* 52*  --   LABPROT 14.8  --   --   --   --   --   --   --   INR 1.2  --   --   --   --   --   --   --   CREATININE  --    < >  --  6.90* 6.53* 4.62*  --  4.60*   < > = values in this interval not displayed.    Estimated Creatinine Clearance: 22.4 mL/min (A) (by C-G formula based on SCr of 4.6 mg/dL (H)).  Assessment: 60 yr old male with RLE DVT, S/P recent lumbar fusion.  Patient continues on IV heparin and was transitioned to [facility] per nephrology on 8/14.   Heparin IV was on hold for trach on 8/16. Of note, incision on back was draining serous fluid on 8/16.  8/18 aPTT: 52 sec Patient has now had 2 aPTTs within goal range Wound vac now in place over incision site on back.  Goal of Therapy:  aPTT 50-75 seconds per neurosurgery Monitor platelets by anticoagulation protocol: Yes   Plan:  Continue heparin infusion at 2700 units/hr  Monitor daily aPTT, CBC Monitor closely for any signs/symptoms of bleeding near incision site  [place] E Modell Fendrick 12/27/2020 7:09

## 2020-12-27 NOTE — Evaluation (Signed)
Physical Therapy Evaluation Patient Details Name: Keith Dixon MRN: [id number] DOB: Aug 11, 1960 Today's Date: 12/27/2020   History of Present Illness  Pt is a 60 y.o. male who presented 12/12/20 with back and R>L leg pain. S/p DLIFs L1-2 L2-3 L34 L4-5 and T10 pelvis decompression and fusion 8/3 and second surgery 8/4. ETT 8/4 - 8/15, re-intubated 8/15. Found to have R lower extremity DVT and left IJ DVT with IVC filter placed 8/6. AKI. S/p tracheostomy 8/16. 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 PMH: PTSD, OSA, HTN, tobacco use, bipolar, degenerative scoliosis, seizures   Clinical Impression  Pt presents with condition above and deficits mentioned below, see PT Problem List. PTA, he was living alone in a ground-level apartment with a flight of stairs inside to access his bedroom and shower. He was using a rollator and SPC for mod I mobility. Currently, pt is requiring max-TA for bed mobility and static sitting balance. Pt displays overall weakness, especially on his R side. In addition, he displays deficits in balance, coordination, and activity tolerance. He is at high risk for falls. Recommending follow-up with intensive therapy in the CIR setting to maximize functional mobility independence and safety prior to return home. Will continue to follow acutely.     Follow Up Recommendations CIR;Supervision/Assistance - 24 hour    Equipment Recommendations  Wheelchair (measurements PT);Wheelchair cushion (measurements PT);Hospital bed;Rolling walker with 5" wheels (may change with progression)    Recommendations for Other Services Rehab consult     Precautions / Restrictions Precautions Precautions: Fall;Back Precaution Booklet Issued: No Precaution Comments: reviewed spinal precautions, trach, cortrak, wound vac to back Required Braces or Orthoses: Spinal Brace Spinal Brace: Thoracolumbosacral orthotic;Applied  in sitting position Restrictions Weight Bearing Restrictions: No      Mobility  Bed Mobility Overal bed mobility: Needs Assistance Bed Mobility: Rolling;Sidelying to Sit;Sit to Supine Rolling: Max assist Sidelying to sit: Total assist;HOB elevated   Sit to supine: Max assist   General bed mobility comments: Cues to reach L UE for R bed rail, but poor grip of L UE to hold onto rail, maxA to log roll with assistance to flex knees. Cues to bring legs off EOB and push up on bed with R UE but little activation by pt, TA to transition sidelying > sit EOB. MaxA to lean onto R elbow and lift legs to return to supine.    Transfers                 General transfer comment: Unable this date due to no +2 assistance available and strong posterior lean in sitting.  Ambulation/Gait             General Gait Details: Unable this date.  Stairs            Wheelchair Mobility    Modified Rankin (Stroke Patients Only) Modified Rankin (Stroke Patients Only) Pre-Morbid Rankin Score: Moderate disability Modified Rankin: Severe disability     Balance Overall balance assessment: Needs assistance Sitting-balance support: Bilateral upper extremity supported;Feet supported Sitting balance-Leahy Scale: Zero Sitting balance - Comments: Strong posterior lean with pt actively resisting leaning anteriorly to sit upright due to noted pain, max-TA to sit statically EOB. Postural control: Posterior lean     Standing balance comment: Unable this date.                             Pertinent Vitals/Pain  Pain Assessment: Faces Faces Pain Scale: Hurts even more Pain Location: back with sitting upright Pain Descriptors / Indicators: Grimacing;Guarding;Operative site guarding Pain Intervention(s): Limited activity within patient's tolerance;Monitored during session;Repositioned    Home Living Family/patient expects to be discharged to:: Private residence Living Arrangements:  Alone Available Help at Discharge: Family;Personal care attendant;Available 24 hours/day (x3 days a week for ~3 hrs/day he has a personal care attendant come to the home and perform chores and cooking if he desired) Type of Home: Apartment Home Access: Level entry     Home Layout: Two level;Bed/bath upstairs (no bedroom or shower downstairs) Home Equipment: Walker - 4 wheels;Cane - single point;Bedside commode      Prior Function Level of Independence: Independent with assistive device(s)         Comments: Can drive, but tends to have others drive and do shopping for him. uses rollator indoors and SPC outdoors. Used to work at a [employer].     Hand Dominance   Dominant Hand: Right    Extremity/Trunk Assessment   Upper Extremity Assessment Upper Extremity Assessment: Generalized weakness;RUE deficits/detail (gross incoordination bil) RUE Deficits / Details: Increased weakness noted in R UE compared to L RUE Coordination: decreased gross motor;decreased fine motor    Lower Extremity Assessment Lower Extremity Assessment: Generalized weakness;RLE deficits/detail (bil gross incoordination) RLE Deficits / Details: Increased weakness noted compared to L RLE Coordination: decreased fine motor;decreased gross motor    Cervical / Trunk Assessment Cervical / Trunk Assessment: Other exceptions Cervical / Trunk Exceptions: s/p spinal surgery  Communication   Communication: Tracheostomy  Cognition Arousal/Alertness: Awake/alert Behavior During Therapy: Flat affect Overall Cognitive Status: Difficult to assess                                 General Comments: Pt attempting to follow cues and mouth words. Difficult to formally assess cognition due to trach and weakness.      General Comments      Exercises     Assessment/Plan    PT Assessment Patient needs continued PT services  PT Problem List Decreased strength;Decreased range of motion;Decreased activity  tolerance;Decreased balance;Decreased mobility;Decreased cognition;Decreased coordination;Decreased knowledge of use of DME;Decreased safety awareness;Decreased knowledge of precautions;Pain       PT Treatment Interventions DME instruction;Gait training;Stair training;Functional mobility training;Therapeutic activities;Therapeutic exercise;Balance training;Neuromuscular re-education;Cognitive remediation;Patient/family education;Wheelchair mobility training    PT Goals (Current goals can be found in the Care Plan section)  Acute Rehab PT Goals Patient Stated Goal: to get better PT Goal Formulation: With patient/family Time For Goal Achievement: 01/10/21 Potential to Achieve Goals: Good    Frequency Min 5X/week   Barriers to discharge        Co-evaluation               AM-PAC PT "6 Clicks" Mobility  Outcome Measure Help needed turning from your back to your side while in a flat bed without using bedrails?: A Lot Help needed moving from lying on your back to sitting on the side of a flat bed without using bedrails?: Total Help needed moving to and from a bed to a chair (including a wheelchair)?: Total Help needed standing up from a chair using your arms (e.g., wheelchair or bedside chair)?: Total Help needed to walk in hospital room?: Total Help needed climbing 3-5 steps with a railing? : Total 6 Click Score: 7    End of Session Equipment Utilized During Treatment:  Oxygen Activity Tolerance: Patient limited by pain;Patient limited by fatigue Patient left: in bed;with call bell/phone within reach;with bed alarm set;with family/visitor present Nurse Communication: Mobility status;Need for lift equipment PT Visit Diagnosis: Muscle weakness (generalized) (M62.81);Difficulty in walking, not elsewhere classified (R26.2);Other symptoms and signs involving the nervous system (R29.898);Hemiplegia and hemiparesis;Pain Hemiplegia - Right/Left: Right Hemiplegia - dominant/non-dominant:  Dominant Hemiplegia - caused by: Cerebral infarction Pain - part of body:  (back)    Time: 1155-1230 PT Time Calculation (min) (ACUTE ONLY): 35 min   Charges:   PT Evaluation $PT Eval Moderate Complexity: 1 Mod PT Treatments $Therapeutic Activity: 8-22 mins        Moishe Spice, PT, DPT Acute Rehabilitation Services  Pager: [phone] Office: [phone]   Orvan Falconer 12/27/2020, 1:36 PM

## 2020-12-27 NOTE — Progress Notes (Signed)
Patient ID: Keith Dixon, male   DOB: 1961-03-17, 60 y.o.   MRN: [id number] [facility] Progress Note   Assessment/ Plan:   1.  Acute kidney injury: Appears to be from a combination of ATN and rhabdomyolysis/pigment nephropathy.  He suffered dense renal injury and has been nonoliguric with seemingly stable BUN/creatinine from overnight labs (only 12h apart).  Will order for hemodialysis tomorrow and follow labs for renal recovery. 2.  Acute postoperative hypoxic/hypercapnic respiratory failure: Status post tracheostomy and currently on ventilator support per CCM. 4.  Lumbar spine stenosis/scoliosis status post decompression and fusion surgery: Ongoing management/surveillance by neurosurgery. 5.  Shock: Secondary to distributive/hypovolemic mechanism; this is resolved and he is currently hypertensive. 6.  Acute blood loss anemia: Secondary to surgical associated losses/critical illness, downtrending of hemoglobin and hematocrit noted and will defer decision for PRBC transfusion to primary service. 7.  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  Subjective:   No acute events overnight- maintaining decent UOP overnight   Objective:   BP (!) 150/75   Pulse 94   Temp 98.4 F (36.9 C) (Oral)   Resp (!) 33   Ht 6' (1.829 m)   Wt 109.8 kg   SpO2 100%   BMI 32.83 kg/m   Intake/Output Summary (Last 24 hours) at 12/27/2020 1016 Last data filed at 12/27/2020 0600 Gross per 24 hour  Intake 1587.39 ml  Output 925 ml  Net 662.39 ml   Weight change: -3 kg  Physical Exam: Gen: Appears comfortable on ventilator via tracheostomy CVS: Pulse regular rhythm, tachycardic, S1 and S2 normal Resp: Anteriorly clear to auscultation, no rales/rhonchi.  Right IJ TDC Abd: Soft, obese, nontender, bowel sounds normal Ext: Trace lower extremity edema, 1+ upper extremity edema  Imaging: DG CHEST PORT 1 VIEW  Result Date: 12/27/2020 CLINICAL DATA:   History of tracheostomy. EXAM: PORTABLE CHEST 1 VIEW COMPARISON:  Exam from December 25, 2020. FINDINGS: Tracheostomy tube remains in place, tip between clavicular heads. There is a feeding tube also in place which courses through in off the field of the radiograph. RIGHT sided IJ central venous access device terminates in the caval to atrial junction. Signs of spinal fusion are seen incompletely extending into the lower thoracic spine. EKG leads and tubes project over the patient's chest as on the previous study. There is improved aeration at the RIGHT lung base with minimal platelike airspace disease overlying the RIGHT hemidiaphragm on the current study. Still with some increased prominence of the interstitium. Cardiomediastinal contours remain stable in the setting of low lung volumes. On limited assessment there is no acute skeletal process visible. IMPRESSION: 1. Improved aeration at the RIGHT lung base with minimal platelike airspace disease on the current study. Likely reflecting improving atelectasis, still with low lung volumes mild prominence of the interstitium. Correlate with any signs of heart failure or volume overload. 2. Unchanged appearance of visible support apparatus. Electronically Signed   By: Zetta Bills M.D.   On: 12/27/2020 08:03   DG CHEST PORT 1 VIEW  Result Date: 12/25/2020 CLINICAL DATA:  History of tracheostomy EXAM: PORTABLE CHEST 1 VIEW COMPARISON:  12/24/2020 FINDINGS: Tracheostomy tube tip about 3.8 cm superior to carina. Low lung volumes. Borderline cardiomegaly with vascular congestion. Worsened opacity right base. No pneumothorax. Right-sided central venous catheter tip over the cavoatrial region. Esophageal tube tip below the diaphragm but incompletely visualized IMPRESSION: 1. Interval placement of tracheostomy tube as above. 2. Low lung volumes. Worsened airspace disease at  the right base which may reflect atelectasis or pneumonia 3. Borderline cardiomegaly with vascular  congestion Electronically Signed   By: Donavan Foil M.D.   On: 12/25/2020 18:34    Labs: BMET Recent Labs  Lab 12/24/20 0345 12/24/20 1359 12/24/20 1637 12/25/20 0608 12/25/20 1504 12/26/20 0639 12/26/20 1757 12/27/20 0235  NA 137 137 137 140 138 140 139 141  K 4.9 5.4* 5.6* 5.2* 5.2* 4.9 4.6 4.1  CL 103  --  102 106 105 106 104 104  CO2 25  --  23 22 24 23 25 25   GLUCOSE 129*  --  125* 113* 108* 128* 107* 129*  BUN 92*  --  102* 112* 118* 123* 76* 78*  CREATININE 5.75*  --  6.39* 6.93* 6.90* 6.53* 4.62* 4.60*  CALCIUM 8.7*  --  9.2 9.3 9.0 9.3 9.2 9.2  PHOS 4.3  --  5.3* 5.2* 6.8* 6.2* 4.7* 4.9*   CBC Recent Labs  Lab 12/24/20 0345 12/24/20 1359 12/25/20 0746 12/25/20 1504 12/26/20 0639 12/27/20 0234  WBC 10.3  --  11.0*  --  13.4* 14.0*  HGB 7.0*   < > 6.7* 8.0* 8.2* 8.1*  HCT 21.9*   < > 22.1* 24.1* 26.0* 25.4*  MCV 88.0  --  90.6  --  86.1 84.9  PLT 195  --  216  --  311 311   < > = values in this interval not displayed.   Medications:     albuterol  2.5 mg Nebulization TID   alteplase  2 mg Intracatheter Once   amLODipine  2.5 mg Per Tube Daily   aspirin  81 mg Per Tube Daily   bacitracin   Topical Daily   chlorhexidine gluconate (MEDLINE KIT)  15 mL Mouth Rinse BID   Chlorhexidine Gluconate Cloth  6 each Topical Daily   cholecalciferol  2,000 Units Per Tube BID   ciprofloxacin  1 drop Left Eye Q4H while awake   docusate  100 mg Per Tube BID   feeding supplement (PROSource TF)  45 mL Per Tube TID   folic acid  1 mg Per Tube Daily   gabapentin  300 mg Per Tube Daily   lamoTRIgine  100 mg Per Tube BID   levETIRAcetam  500 mg Per Tube BID   levETIRAcetam  500 mg Per Tube Q T,Th,Sat-1800   mouth rinse  15 mL Mouth Rinse 10 times per day   pantoprazole sodium  40 mg Per Tube QHS   polyethylene glycol  17 g Per Tube Daily   QUEtiapine  200 mg Per Tube QHS   sertraline  100 mg Per Tube QPC breakfast   sodium chloride flush  10-40 mL Intracatheter Q12H    sodium chloride flush  3 mL Intravenous Q12H   thiamine injection  100 mg Intravenous Daily   vitamin B-12  1,000 mcg Per Tube Daily   zinc sulfate  220 mg Per Tube Daily   Elmarie Shiley, MD 12/27/2020, 10:16 AM

## 2020-12-28 DIAGNOSIS — J9602 Acute respiratory failure with hypercapnia: Secondary | ICD-10-CM | POA: Diagnosis not present

## 2020-12-28 DIAGNOSIS — J9601 Acute respiratory failure with hypoxia: Secondary | ICD-10-CM | POA: Diagnosis not present

## 2020-12-28 LAB — RENAL FUNCTION PANEL
Albumin: 2.2 g/dL — ABNORMAL LOW (ref 3.5–5.0)
Albumin: 2.2 g/dL — ABNORMAL LOW (ref 3.5–5.0)
Anion gap: 11 (ref 5–15)
Anion gap: 10 (ref 5–15)
BUN: 89 mg/dL — ABNORMAL HIGH (ref 6–20)
BUN: 91 mg/dL — ABNORMAL HIGH (ref 6–20)
CO2: 25 mmol/L (ref 22–32)
CO2: 23 mmol/L (ref 22–32)
Calcium: 9 mg/dL (ref 8.9–10.3)
Calcium: 8.6 mg/dL — ABNORMAL LOW (ref 8.9–10.3)
Chloride: 109 mmol/L (ref 98–111)
Chloride: 108 mmol/L (ref 98–111)
Creatinine, Ser: 4.17 mg/dL — ABNORMAL HIGH (ref 0.61–1.24)
Creatinine, Ser: 3.82 mg/dL — ABNORMAL HIGH (ref 0.61–1.24)
GFR, Estimated: 16 mL/min — ABNORMAL LOW (ref >60)
GFR, Estimated: 17 mL/min — ABNORMAL LOW (ref >60)
Glucose, Bld: 128 mg/dL — ABNORMAL HIGH (ref 70–99)
Glucose, Bld: 121 mg/dL — ABNORMAL HIGH (ref 70–99)
Phosphorus: 5.2 mg/dL — ABNORMAL HIGH (ref 2.5–4.6)
Phosphorus: 5.2 mg/dL — ABNORMAL HIGH (ref 2.5–4.6)
Potassium: 3.9 mmol/L (ref 3.5–5.1)
Potassium: 3.9 mmol/L (ref 3.5–5.1)
Sodium: 145 mmol/L (ref 135–145)
Sodium: 141 mmol/L (ref 135–145)

## 2020-12-28 LAB — CBC
HCT: 26.7 % — ABNORMAL LOW (ref 39.0–52.0)
Hemoglobin: 8.7 g/dL — ABNORMAL LOW (ref 13.0–17.0)
MCH: 27.7 pg (ref 26.0–34.0)
MCHC: 32.6 g/dL (ref 30.0–36.0)
MCV: 85 fL (ref 80.0–100.0)
Platelets: 378 10*3/uL (ref 150–400)
RBC: 3.14 MIL/uL — ABNORMAL LOW (ref 4.22–5.81)
RDW: 18.3 % — ABNORMAL HIGH (ref 11.5–15.5)
WBC: 16.5 10*3/uL — ABNORMAL HIGH (ref 4.0–10.5)
nRBC: 0 % (ref 0.0–0.2)

## 2020-12-28 LAB — GLUCOSE, CAPILLARY
Glucose-Capillary: 116 mg/dL — ABNORMAL HIGH (ref 70–99)
Glucose-Capillary: 116 mg/dL — ABNORMAL HIGH (ref 70–99)
Glucose-Capillary: 119 mg/dL — ABNORMAL HIGH (ref 70–99)
Glucose-Capillary: 121 mg/dL — ABNORMAL HIGH (ref 70–99)
Glucose-Capillary: 124 mg/dL — ABNORMAL HIGH (ref 70–99)
Glucose-Capillary: 136 mg/dL — ABNORMAL HIGH (ref 70–99)

## 2020-12-28 LAB — MAGNESIUM: Magnesium: 2.3 mg/dL (ref 1.7–2.4)

## 2020-12-28 LAB — APTT: aPTT: 52 seconds — ABNORMAL HIGH (ref 24–36)

## 2020-12-28 NOTE — Evaluation (Signed)
Occupational Therapy Evaluation Patient Details Name: Keith Dixon MRN: [id number] DOB: 09-18-1960 Today's Date: 12/28/2020    History of Present Illness Pt is a 60 y.o. male who presented 12/12/20 with back and R>L leg pain. S/p DLIFs L1-2 L2-3 L34 L4-5 and T10 pelvis decompression and fusion 8/3 and second surgery 8/4. ETT 8/4 - 8/15, re-intubated 8/15. Found to have R lower extremity DVT and left IJ DVT with IVC filter placed 8/6. AKI. S/p tracheostomy 8/16. 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 PMH: PTSD, OSA, HTN, tobacco use, bipolar, degenerative scoliosis, seizures   Clinical Impression   Patient is s/p spinal surgery x2 and CVA resulting in functional limitations due to the deficits listed below (see OT problem list). Pt currently requires total (A) for all adls.a nd total +2 (A) for all bed level mobility. Pt does demonstrate ability to power up from a very elevated bed surface. Spinal brace in room does not reach around the patient and will require extension or additional brace in larger waist band length. Patient will benefit from skilled OT acutely to increase independence and safety with ADLS to allow discharge CIR.     Follow Up Recommendations  CIR    Equipment Recommendations  3 in 1 bedside commode;Wheelchair (measurements OT);Wheelchair cushion (measurements OT);Hospital bed    Recommendations for Other Services Rehab consult     Precautions / Restrictions Precautions Precautions: Fall;Back Precaution Comments: spinal precautions, trach, PMV with therapies, cortrack, wound vac Spinal Brace:  (spinal brace does not fit patient at this time. Dr Saintclair Halsted in room and to order new better fitting brace)      Mobility Bed Mobility Overal bed mobility: Needs Assistance Bed Mobility: Rolling;Supine to Sit;Sit to Supine Rolling: +2 for physical assistance;Max assist;+2 for safety/equipment    Supine to sit: +2 for physical assistance;+2 for safety/equipment;HOB elevated;Total assist     General bed mobility comments: pt static sitting with strong posterior pushing even with cues for anterior weight shift. pt requires total (A) with static sitting.    Transfers Overall transfer level: Needs assistance Equipment used: 2 person hand held assist Transfers: Sit to/from Stand Sit to Stand: +2 physical assistance;Mod assist;From elevated surface         General transfer comment: bed elevated >30 inches and able to power up with mod +2 (A). pt demonstrates anterior weight shift toward chair placed in front of patient. Pt with bil UE on chair back to help with upright posture. chair used due to no RW and height of patient    Balance Overall balance assessment: Needs assistance Sitting-balance support: Feet supported Sitting balance-Leahy Scale: Zero     Standing balance support: Bilateral upper extremity supported;During functional activity Standing balance-Leahy Scale: Poor                             ADL either performed or assessed with clinical judgement   ADL Overall ADL's : Needs assistance/impaired Eating/Feeding: NPO                                     General ADL Comments: pt will requires (A) for all adls at this time. pt with hand over hand grooming max (A). pt with poor sitting tolerance but engages more with standing task with less pain     Vision   Additional  Comments: unknown at this time and to be further assessed. R eye is very red ( red color is like that of a true red crayon very bright and blood color) pt tracking therapist in room     Perception     Praxis      Pertinent Vitals/Pain Pain Assessment: Faces Faces Pain Scale: Hurts whole lot Pain Location: general Pain Descriptors / Indicators: Discomfort Pain Intervention(s): Premedicated before session;Repositioned     Hand Dominance Right   Extremity/Trunk  Assessment Upper Extremity Assessment Upper Extremity Assessment: Generalized weakness;RUE deficits/detail;LUE deficits/detail RUE Deficits / Details: weaker than L UE. pt unable to sustain against gravity.   Lower Extremity Assessment Lower Extremity Assessment: Defer to PT evaluation   Cervical / Trunk Assessment Cervical / Trunk Assessment: Other exceptions Cervical / Trunk Exceptions: s/p surgery x2 parts   Communication Communication Communication: Tracheostomy;Passy-Muir valve   Cognition Arousal/Alertness: Awake/alert Behavior During Therapy: Flat affect Overall Cognitive Status: Difficult to assess                                 General Comments: pt following 2 step commands. pt able to verbalize pain is all over. pt is able to state girlfriend/ daughters name correctly. pt expressed daughter as his POA.   General Comments  VSS montiored BP    Exercises     Shoulder Instructions      Home Living Family/patient expects to be discharged to:: Private residence Living Arrangements: Alone Available Help at Discharge: Family;Personal care attendant;Available 24 hours/day Type of Home: Apartment Home Access: Level entry     Home Layout: Two level;Bed/bath upstairs Alternate Level Stairs-Number of Steps: flight Alternate Level Stairs-Rails: Left Bathroom Shower/Tub: Tub/shower unit   Bathroom Toilet: Standard Bathroom Accessibility: Yes   Home Equipment: Walker - 4 wheels;Cane - single point;Bedside commode          Prior Functioning/Environment Level of Independence: Independent with assistive device(s)                 OT Problem List: Decreased strength;Decreased activity tolerance;Impaired balance (sitting and/or standing);Decreased coordination;Decreased cognition;Decreased safety awareness;Decreased knowledge of precautions;Decreased knowledge of use of DME or AE;Cardiopulmonary status limiting activity;Impaired sensation;Obesity;Impaired  UE functional use;Pain;Decreased range of motion;Impaired vision/perception      OT Treatment/Interventions: Self-care/ADL training;Therapeutic exercise;Neuromuscular education;Energy conservation;DME and/or AE instruction;Manual therapy;Modalities;Therapeutic activities;Cognitive remediation/compensation;Visual/perceptual remediation/compensation;Patient/family education;Balance training    OT Goals(Current goals can be found in the care plan section) Acute Rehab OT Goals Patient Stated Goal: to get better OT Goal Formulation: Patient unable to participate in goal setting Time For Goal Achievement: 01/11/21 Potential to Achieve Goals: Good  OT Frequency: Min 2X/week   Barriers to D/C: Other (comment) (unknown family (A))          Co-evaluation PT/OT/SLP Co-Evaluation/Treatment: Yes Reason for Co-Treatment: Complexity of the patient's impairments (multi-system involvement);Necessary to address cognition/behavior during functional activity   OT goals addressed during session: ADL's and self-care;Proper use of Adaptive equipment and DME;Strengthening/ROM      AM-PAC OT "6 Clicks" Daily Activity     Outcome Measure Help from another person eating meals?: A Lot Help from another person taking care of personal grooming?: A Lot Help from another person toileting, which includes using toliet, bedpan, or urinal?: A Lot Help from another person bathing (including washing, rinsing, drying)?: A Lot Help from another person to put on and taking off regular upper body clothing?: A Lot Help from another  person to put on and taking off regular lower body clothing?: A Lot 6 Click Score: 12   End of Session Equipment Utilized During Treatment: Gait belt;Oxygen Nurse Communication: Mobility status;Precautions;Need for lift equipment  Activity Tolerance: Patient tolerated treatment well Patient left: in bed;with call bell/phone within reach;with chair alarm set  OT Visit Diagnosis: Unsteadiness  on feet (R26.81);Muscle weakness (generalized) (M62.81);Pain                Time: [id number] OT Time Calculation (min): 24 min Charges:  OT General Charges $OT Visit: 1 Visit OT Evaluation $OT Eval High Complexity: 1 High   Brynn, OTR/L  Acute Rehabilitation Services Pager: [phone] Office: [phone] .   Jeri Modena 12/28/2020, 2:45 PM

## 2020-12-28 NOTE — Evaluation (Signed)
Passy-Muir Speaking Valve - Evaluation Patient Details  Name: Keith Dixon MRN: [id number] Date of Birth: [birth year]  Today's Date: 12/28/2020 Time: 1036-1100 SLP Time Calculation (min) (ACUTE ONLY): 24 min  Past Medical History:  Past Medical History:  Diagnosis Date   Anxiety    Arthritis    Bipolar 1 disorder, depressed ([place])    Bipolar disorder ([place])    Depression    History of posttraumatic stress disorder (PTSD)    Hypertension    PTSD (post-traumatic stress disorder)    Seizures ([place])    Sleep apnea    Smoker    Tobacco use disorder    Past Surgical History:  Past Surgical History:  Procedure Laterality Date   ANTERIOR LATERAL LUMBAR FUSION 4 LEVELS N/A 12/12/2020   Procedure: Anterior Lateral Interbody Fusion Lumbar One-Two, Lumbar Two-Three, Lumbar Three-Four, Lumbar Four-Five;  Surgeon: Franchot Gallo, MD;  Location: [facility];  Service: Urology;  Laterality: N/A;  Anterior Lateral Interbody Fusion Lumbar One-Two, Lumbar Two-Three, Lumbar Three-Four, Lumbar Four-Five   APPLICATION OF INTRAOPERATIVE CT SCAN N/A 12/13/2020   Procedure: APPLICATION OF INTRAOPERATIVE CT SCAN;  Surgeon: Vallarie Mare, MD;  Location: [facility] OR;  Service: Neurosurgery;  Laterality: N/A;   CYSTOSCOPY  12/12/2020   Procedure: CYSTOSCOPY, URETHRAL DILATION, DIFFICULT FOLEY INSERTION;  Surgeon: Franchot Gallo, MD;  Location: [facility];  Service: Urology;;   IR FLUORO GUIDE CV LINE RIGHT  12/21/2020   IR IVC FILTER PLMT / S&I /IMG GUID/MOD SED  12/16/2020   IR US GUIDE VASC ACCESS RIGHT  12/21/2020   POSTERIOR LUMBAR FUSION 4 LEVEL N/A 12/13/2020   Procedure: Thoracic Ten - ILIAC FUSION, L5-S1 TLIF, Posterior osteotomies for deformity correction and decompression L2-3, L3-4, L4-5;  Surgeon: Vallarie Mare, MD;  Location: [facility];  Service: Neurosurgery;  Laterality: N/A;   HPI:  Pt is a 60 y.o. male who presented 12/12/20 with back and R>L leg pain. S/p DLIFs L1-2 L2-3 L34 L4-5 and T10 pelvis  decompression and fusion 8/3 and second surgery 8/4. ETT 8/4 - 8/15, re-intubated 8/15. Found to have R lower extremity DVT and left IJ DVT with IVC filter placed 8/6. AKI. S/p tracheostomy 8/16. 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 PMH: PTSD, OSA, HTN, tobacco use, bipolar, degenerative scoliosis, seizures   Assessment / Plan / Recommendation Clinical Impression  Pt on trach collar and able to verbalize using speaking valve with strong volitional cough, hoarse quality and mild-mod decreased respiratory support. No significant back pressure when valve in place. His work of breathing increased minimally although respirations 25 and pt denied respiratory challenge. O2 sats 95-99% and HR 90. He spoke in phrases with hoarse quality, reduced clarity and intensity. Recommend he utilize valve with therapies/staff with full supervision, ensure cuff deflated and remove when sleeping. Swallow assessment likey first next week if continues to tolerate valve. SLP Visit Diagnosis: Aphonia (R49.1)    SLP Assessment  Patient needs continued Speech Lanaguage Pathology Services    Follow Up Recommendations  Inpatient Rehab    Frequency and Duration min 2x/week  2 weeks    PMSV Trial PMSV was placed for: 15-18 min Able to redirect subglottic air through upper airway: Yes Able to Attain Phonation: Yes Voice Quality: Hoarse Able to Expectorate Secretions: Yes Level of Secretion Expectoration with PMSV: Tracheal Breath Support for Phonation: Mildly decreased Intelligibility: Intelligibility reduced Word: 75-100% accurate Phrase: 50-74% accurate Sentence: 25-49% accurate Respirations During Trial: 25 SpO2 During  Trial:  (95-99%) Pulse During Trial: 90 Behavior: Alert;Controlled;Cooperative;Responsive to questions   Tracheostomy Tube       Vent Dependency       Cuff Deflation Trial  GO Tolerated Cuff Deflation:  Yes Length of Time for Cuff Deflation Trial: 24 min Behavior: Alert;Controlled;Cooperative;Responsive to questions        Houston Siren 12/28/2020, 12:32 PM  Orbie Pyo Colvin Caroli.Ed Risk analyst [phone] Office [phone]

## 2020-12-28 NOTE — Progress Notes (Signed)
Subjective: Patient reports  doing okay condition of severe pain in his back denies any significant pain in his legs although communication is difficult he does have a talking trach and now but still difficult to get messaging across  Objective: Vital signs in last 24 hours: Temp:  [98.3 F (36.8 C)-99.3 F (37.4 C)] 98.6 F (37 C) (08/19 1200) Pulse Rate:  [86-97] 90 (08/19 1113) Resp:  [18-35] 24 (08/19 1113) BP: (125-179)/(63-106) 164/84 (08/19 1113) SpO2:  [93 %-100 %] 95 % (08/19 1113) FiO2 (%):  [21 %-28 %] 21 % (08/19 1113) Weight:  [id number] kg] 108 kg (08/19 0500)  Intake/Output from previous day: 08/18 0701 - 08/19 0700 In: 1968 [I.V.:648; NG/GT:1320] Out: 1495 [Urine:1495] Intake/Output this shift: Total I/O In: 220 [NG/GT:220] Out: 625 [Urine:625]  Awake and alert was able to stand with physical therapy moves both extremities appears to have some generalized weakness incision clean dry and intact wound VAC in place  Lab Results: Recent Labs    12/27/20 0234 12/28/20 0518  WBC 14.0* 16.5*  HGB 8.1* 8.7*  HCT 25.4* 26.7*  PLT 311 378   BMET Recent Labs    12/27/20 1958 12/28/20 0518  NA 142 145  K 3.9 3.9  CL 107 109  CO2 24 25  GLUCOSE 127* 128*  BUN 85* 89*  CREATININE 4.28* 4.17*  CALCIUM 8.9 9.0    Studies/Results: DG CHEST PORT 1 VIEW  Result Date: 12/27/2020 CLINICAL DATA:  History of tracheostomy. EXAM: PORTABLE CHEST 1 VIEW COMPARISON:  Exam from December 25, 2020. FINDINGS: Tracheostomy tube remains in place, tip between clavicular heads. There is a feeding tube also in place which courses through in off the field of the radiograph. RIGHT sided IJ central venous access device terminates in the caval to atrial junction. Signs of spinal fusion are seen incompletely extending into the lower thoracic spine. EKG leads and tubes project over the patient's chest as on the previous study. There is improved aeration at the RIGHT lung base with minimal  platelike airspace disease overlying the RIGHT hemidiaphragm on the current study. Still with some increased prominence of the interstitium. Cardiomediastinal contours remain stable in the setting of low lung volumes. On limited assessment there is no acute skeletal process visible. IMPRESSION: 1. Improved aeration at the RIGHT lung base with minimal platelike airspace disease on the current study. Likely reflecting improving atelectasis, still with low lung volumes mild prominence of the interstitium. Correlate with any signs of heart failure or volume overload. 2. Unchanged appearance of visible support apparatus. Electronically Signed   By: Zetta Bills M.D.   On: 12/27/2020 08:03    Assessment/Plan: Patient is now mobilizing with physical and Occupational Therapy we will try to get him a better brace the current brace is not stabilizing him at all.  We will go to a hard tortoise shell TLSO  LOS: 16 days     Elaina Hoops 12/28/2020, 12:45 PM

## 2020-12-28 NOTE — Progress Notes (Signed)
[facility] NEPHROLOGY PROGRESS NOTE  Assessment/ Plan:  # Acute kidney injury, non-oliguric: appears to be from a combination of ATN and rhabdomyolysis/pigment nephropathy.  The last dialysis was on 8/17.  He seems to have some renal recovery with increasing urine output and creatinine level stabilized around 4.17 today.  Electrolytes are acceptable.  I will hold off dialysis today and continue to watch for renal recovery.  Recommend strict ins and outs, daily lab to assess for dialysis need.  Discussed with the nursing staff and with the patient.  #  Acute postoperative hypoxic/hypercapnic respiratory failure: Status post tracheostomy and currently on ventilator support per CCM.  #  Lumbar spine stenosis/scoliosis status post decompression and fusion surgery: Ongoing management/surveillance by neurosurgery.  # Shock: Secondary to distributive/hypovolemic mechanism; this is resolved and he is currently hypertensive. On amlodipine.   # Acute blood loss anemia: Secondary to surgical associated losses/critical illness, getting transfusion per primary team.  # Acute multifocal bilateral CVA: Suggestive of embolic disease, supportive management.  Subjective: Seen and examined in ICU.  Currently on tracheostomy.  He had urine output around 1.5 L in 24 hours and already has more than 600 cc of urine in the bag.  No new event.  Denies nausea vomiting chest pain shortness of breath.  Some coughing. Objective Vital signs in last 24 hours: Vitals:   12/28/20 0600 12/28/20 0700 12/28/20 0735 12/28/20 0800  BP: (!) 170/93 140/69 140/69   Pulse: 90 89 89   Resp: (!) 33 (!) 28 (!) 32   Temp:    98.3 F (36.8 C)  TempSrc:    Oral  SpO2: 95% 94% 95%   Weight:      Height:       Weight change: -1.8 kg  Intake/Output Summary (Last 24 hours) at 12/28/2020 1056 Last data filed at 12/28/2020 0700 Gross per 24 hour  Intake 1721.88 ml  Output 1495 ml  Net 226.88 ml        Labs: Basic Metabolic Panel: Recent Labs  Lab 12/27/20 0235 12/27/20 1958 12/28/20 0518  NA 141 142 145  K 4.1 3.9 3.9  CL 104 107 109  CO2 _0 GLUCOSE 129* 127* 128*  BUN 78* 85* 89*  CREATININE 4.60* 4.28* 4.17*  CALCIUM 9.2 8.9 9.0  PHOS 4.9* 5.2* 5.2*   Liver Function Tests: Recent Labs  Lab 12/27/20 0235 12/27/20 1958 12/28/20 0518  ALBUMIN 2.2* 2.2* 2.2*   No results for input(s): LIPASE, AMYLASE in the last 168 hours. No results for input(s): AMMONIA in the last 168 hours. CBC: Recent Labs  Lab 12/24/20 0345 12/24/20 1359 12/25/20 0746 12/25/20 1504 12/26/20 0639 12/27/20 0234 12/28/20 0518  WBC 10.3  --  11.0*  --  13.4* 14.0* 16.5*  HGB 7.0*   < > 6.7*   < > 8.2* 8.1* 8.7*  HCT 21.9*   < > 22.1*   < > 26.0* 25.4* 26.7*  MCV 88.0  --  90.6  --  86.1 84.9 85.0  PLT 195  --  216  --  311 311 378   < > = values in this interval not displayed.   Cardiac Enzymes: No results for input(s): CKTOTAL, CKMB, CKMBINDEX, TROPONINI in the last 168 hours. CBG: Recent Labs  Lab 12/27/20 0324 12/27/20 0707 12/27/20 2003 12/28/20 0029 12/28/20 0748  GLUCAP 119* 111* 119* 121* 136*    Iron Studies: No results for input(s): IRON, TIBC, TRANSFERRIN, FERRITIN in the last 72 hours. Studies/Results:  DG CHEST PORT 1 VIEW  Result Date: 12/27/2020 CLINICAL DATA:  History of tracheostomy. EXAM: PORTABLE CHEST 1 VIEW COMPARISON:  Exam from December 25, 2020. FINDINGS: Tracheostomy tube remains in place, tip between clavicular heads. There is a feeding tube also in place which courses through in off the field of the radiograph. RIGHT sided IJ central venous access device terminates in the caval to atrial junction. Signs of spinal fusion are seen incompletely extending into the lower thoracic spine. EKG leads and tubes project over the patient's chest as on the previous study. There is improved aeration at the RIGHT lung base with minimal platelike airspace  disease overlying the RIGHT hemidiaphragm on the current study. Still with some increased prominence of the interstitium. Cardiomediastinal contours remain stable in the setting of low lung volumes. On limited assessment there is no acute skeletal process visible. IMPRESSION: 1. Improved aeration at the RIGHT lung base with minimal platelike airspace disease on the current study. Likely reflecting improving atelectasis, still with low lung volumes mild prominence of the interstitium. Correlate with any signs of heart failure or volume overload. 2. Unchanged appearance of visible support apparatus. Electronically Signed   By: Zetta Bills M.D.   On: 12/27/2020 08:03    Medications: Infusions:  sodium chloride Stopped (12/25/20 1749)   feeding supplement (NEPRO CARB STEADY) 1,000 mL (12/28/20 0433)   heparin 2,700 Units/hr (12/28/20 1033)    Scheduled Medications:  albuterol  2.5 mg Nebulization TID   alteplase  2 mg Intracatheter Once   amLODipine  2.5 mg Per Tube Daily   aspirin  81 mg Per Tube Daily   bacitracin   Topical Daily   chlorhexidine gluconate (MEDLINE KIT)  15 mL Mouth Rinse BID   Chlorhexidine Gluconate Cloth  6 each Topical Daily   cholecalciferol  2,000 Units Per Tube BID   ciprofloxacin  1 drop Left Eye Q4H while awake   docusate  100 mg Per Tube BID   feeding supplement (PROSource TF)  45 mL Per Tube TID   folic acid  1 mg Per Tube Daily   gabapentin  300 mg Per Tube Daily   lamoTRIgine  100 mg Per Tube BID   levETIRAcetam  500 mg Per Tube BID   levETIRAcetam  500 mg Per Tube Q T,Th,Sat-1800   mouth rinse  15 mL Mouth Rinse 10 times per day   pantoprazole sodium  40 mg Per Tube QHS   polyethylene glycol  17 g Per Tube Daily   QUEtiapine  200 mg Per Tube QHS   sertraline  100 mg Per Tube QPC breakfast   sodium chloride flush  10-40 mL Intracatheter Q12H   sodium chloride flush  3 mL Intravenous Q12H   thiamine injection  100 mg Intravenous Daily   vitamin B-12   1,000 mcg Per Tube Daily   zinc sulfate  220 mg Per Tube Daily    have reviewed scheduled and prn medications.  Physical Exam: General: Comfortable on vent and tracheostomy. Heart:RRR, s1s2 nl Lungs: Coarse breath sound bilateral Abdomen:soft, Non-tender, non-distended Extremities:No edema Dialysis Access: Right IJ HD catheter.  Keith Dixon Prasad Nunzio Banet 12/28/2020,10:56 AM  LOS: 16 days

## 2020-12-28 NOTE — Progress Notes (Signed)
Inpatient Rehabilitation Admissions Coordinator   Inpatient rehab consult received. I spoke with pt's daughter and son in law by phone. PTA patient lived alone but his girlfriend, [place], came by daily. He also had a aide who assisted a few times a week as needed around the house. Daughter and son unable to provide physical care. I will follow up with his girlfriend next week concerning caregiver supports she can provide. Girlfriend has had deaths in the family this week and therefore I will not pursue clarification today.  Danne Baxter, RN, MSN Rehab Admissions Coordinator [phone] 12/28/2020 3:54 PM

## 2020-12-28 NOTE — Progress Notes (Addendum)
NAME:  Keith Dixon, MRN:  [id number], DOB:  [birth year], LOS: 68 ADMISSION DATE:  12/12/2020, CONSULTATION DATE:  12/28/20  REFERRING MD:  [place]ville Silos, CHIEF COMPLAINT:   respiratory depression  History of Present Illness:  Keith Dixon is a 60 y/o M with PMH significant for severe degenerative scoliosis with multi-level listhesis, anxiety, bipolar disorder, PTSD, seizures, OSA and tobacco use who was admitted to neurosurgery for L1-2, L2-3, L3-4, L4-5 DLIFs initially and subsequent T10-pelvis decompression and fusion.   His initial surgery was 8/3 and was slow to wake up, after second  surgery 8/4  (10hr procedure) pt was again slow to awaken post-anesthesia, so decision was made to leave patient intubated overnight.    Significant Hospital Events:  8/3 Admit to neurosurgery, underwent  anterior lateral Interbody fusion of L1-L5, notably had difficult foley insertion requiring urethral dilation 8/4 Stage 2 posterior decompression and fusion, left intubated post-procedure and PCCM consulted 8/5 AKI seen on am labs with evidence of volume depletion on POC [place] 8/6: Patient AKI was due to acute rhabdomyolysis in the setting of prolonged surgery, he remained oliguric.  Ultrasound lower extremity showed acute right lower extremity DVT and left IJ DVT, IVC filter placed 8/7: Patient serum creatinine continues to trend up, he remained oliguric, started on CRRT Head CT showed small multiple bilateral strokes.  EEG was placed  8/9>. No seizures per EEG, remains on Levo, Propofol and fentanyl with good vent synchrony. Remains on CVVH with UF goal of 50 cc per hour, but increasing pressor needs this am  8/11: off pressors, mental status precluding extubation 8/12: Tunneled HD line placement 8/13: Tolerating some PSV on fentanyl 8/13 MRI/A brain > multiple acute/subacute CVA in the cerebellum, left > right thalami, bilateral cerebral hemispheres.  No abnormal findings in the neck, normal angiography 8/14  Began HD 8/15 Extubated and declined to due to stridor and required reintubation.  8/16 S/p tracheostomy. Hgb 6.7 transfused 1u pRBC likely from actively draining serosanguinous fluid from surgical site on back. NSGY expressed fluid from surgical site until resolution. 8/17 UOP improving  Interim History / Subjective:  Doin ok with trach collar. Doesn't complain of pain today. No bothersome cough.  Objective   Blood pressure 140/69, pulse 89, temperature 98.3 F (36.8 C), temperature source Oral, resp. rate (!) 32, height 6' (1.829 m), weight 108 kg, SpO2 95 %.    FiO2 (%):  [21 %-28 %] 21 %   Intake/Output Summary (Last 24 hours) at 12/28/2020 1125 Last data filed at 12/28/2020 0700 Gross per 24 hour  Intake 1639.83 ml  Output 1495 ml  Net 144.83 ml   Filed Weights   12/26/20 0630 12/26/20 0956 12/28/20 0500  Weight: 112.8 kg 109.8 kg 108 kg   Physical Exam  General: Ill-appearing gentleman, obese, lying in bed.  HEENT: NCAT, trach with trach collar in place.   CV: normal rate and rhythm, no murmurs or gallops.  PULM:  Diminished bilaterally, mildly tachypneic GI: Nondistended, non tender, bowel sounds present. Skin: Pressure dressings on bilateral heels.   Neuro: Eyes open, PERRL, Follows commands.    Assessment & Plan:  Acute post operative hypoxic and hypercapnic respiratory failure S/p tracheostomy 8/16.  -Continue trach collar as tolerated -Sit upright in bed, PT/OT, IS  Acute encephalopathy, agitation Hopefully will continue to improve now on trach collar and off sedation. -Continue Seroquel, consider uptitrate  Acute oliguric renal failure: secondary to rhabdomyolysis and ATN this admission.  -Appreciate nephrology management - holding off on  HD today watching for renal recovery, unclear if will clearance will be adequate however even if UOP ok  Hypertension -Continue amlodipine 2.5 mg, will uptitrate if needed.   Acute on chronic anemia. Likely related to  surgical losses, critical illness, and surgical wound site. Hgb stable.  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  -Appreciate neurology assistance -Heparin infusion as ordered -PT/OT  Acute right lower extremity DVT, acute left IJ DVT s/p IVC filter placement:  -Continue Heparin infusion, targeting lower end of therapeutic range until okay to increase per neurosurgery plans -Holding off on transition to eliquis until clarify whether there is need for PEG   Hx of seizure disorder. No epileptic activity on EEG -Continue home Keppra and Lamictal  Lumbar spinal stenosis s/p decompression and fusion -Pain control -Antibiotics completed  High risk malnutrition -Continue tube feeding  Deep pressure injury of left heel. -Continue wound care  Chest hematoma.  Does not appear to be expanding on heparin fusion -Following  Best Practice    Diet/type: NPO, TF DVT prophylaxis: heparin gtt GI prophylaxis: PPI Lines: Right IJ HD line permcath, Left peripheral IV, Right peripheral IV, NG.  Foley: Removed 8/11 Code Status:  full code Last date of multidisciplinary goals of care discussion: updated family on 8/16 Disposition: May transition to stepdown later today if remains stable from respiratory and hemodynamic standpoint, will discuss with neurosurgery

## 2020-12-28 NOTE — Progress Notes (Signed)
[place] for IV Heparin Indication: DVT  No Known Allergies  Patient Measurements: Height: 6' (182.9 cm) Weight: 108 kg (238 lb 1.6 oz) IBW/kg (Calculated) : 77.6 Heparin Dosing Weight: 99 kg   Vital Signs: Temp: 99 F (37.2 C) (08/19 0400) Temp Source: Oral (08/19 0400) BP: 140/69 (08/19 0735) Pulse Rate: 89 (08/19 0735)  Labs: Recent Labs    12/26/20 0639 12/26/20 1757 12/27/20 0234 12/27/20 0235 12/27/20 1958 12/28/20 0518  HGB 8.2*  --  8.1*  --   --  8.7*  HCT 26.0*  --  25.4*  --   --  26.7*  PLT 311  --  311  --   --  378  APTT  --  52* 52*  --   --  52*  CREATININE 6.53* 4.62*  --  4.60* 4.28* 4.17*    Estimated Creatinine Clearance: 24.5 mL/min (A) (by C-G formula based on SCr of 4.17 mg/dL (H)).  Assessment: 60 yr old male with RLE DVT, S/P recent lumbar fusion.  Patient continues on IV heparin and was transitioned to [facility] per nephrology on 8/14.   Heparin IV was on hold for trach on 8/16. Of note, incision on back was draining serous fluid on 8/16.  8/19 aPTT: 52 sec Wound vac now in place over incision site on back.  Goal of Therapy:  aPTT 50-75 seconds per neurosurgery Monitor platelets by anticoagulation protocol: Yes   Plan:  Continue heparin infusion at 2700 units/hr  Monitor daily aPTT, CBC Monitor closely for any signs/symptoms of bleeding near incision site **Considering transition to [employer] E Maisha Bogen 12/28/2020 7:45 AM

## 2020-12-28 NOTE — Progress Notes (Signed)
Orthopedic Tech Progress Note Patient Details:  Keith Dixon 09-04-1960 [id number] Patient has brace Patient ID: Keith Dixon, male   DOB: 01-07-1961, 60 y.o.   MRN: [id number]  [name] 12/28/2020, 2:50 PM

## 2020-12-28 NOTE — Progress Notes (Signed)
Physical Therapy Treatment Patient Details Name: Keith Dixon MRN: [id number] DOB: [birth year] Today's Date: 12/28/2020    History of Present Illness Pt is a 60 y.o. male who presented 12/12/20 with back and R>L leg pain. S/p DLIFs L1-2 L2-3 L34 L4-5 and T10 pelvis decompression and fusion 8/3 and second surgery 8/4. ETT 8/4 - 8/15, re-intubated 8/15. Found to have R lower extremity DVT and left IJ DVT with IVC filter placed 8/6. AKI. S/p tracheostomy 8/16. 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 PMH: PTSD, OSA, HTN, tobacco use, bipolar, degenerative scoliosis, seizures    PT Comments    Pt continues to display a strong posterior bias with sitting, resulting in him requiring max-TA to sit EOB. However, he did display an anterior weight shift to permit him to transfer to stand 1x from EOB to posterior aspect of recliner with UE support, bil knee block, and modAx2. His lower extremity weakness limits him from being able to fully extend his legs to obtain a full upright standing position. Will continue to follow acutely. Current recommendations remain appropriate.    Follow Up Recommendations  CIR;Supervision/Assistance - 24 hour     Equipment Recommendations  Wheelchair (measurements PT);Wheelchair cushion (measurements PT);Hospital bed;Rolling walker with 5" wheels    Recommendations for Other Services       Precautions / Restrictions Precautions Precautions: Fall;Back Precaution Booklet Issued: No Precaution Comments: spinal precautions, trach, PMV with therapies, cortrack, wound vac Spinal Brace:  (spinal brace does not fit patient at this time. Dr Saintclair Halsted in room and to order new better fitting brace) Restrictions Weight Bearing Restrictions: No    Mobility  Bed Mobility Overal bed mobility: Needs Assistance Bed Mobility: Rolling;Supine to Sit;Sit to Supine Rolling: +2 for physical assistance;Max  assist;+2 for safety/equipment   Supine to sit: +2 for physical assistance;+2 for safety/equipment;HOB elevated;Total assist Sit to supine: Total assist;+2 for physical assistance;+2 for safety/equipment   General bed mobility comments: Pt attempts to initiate leg and arm movement to assist with bed mobility, but min success. TAx2 for all bed mobility this date.    Transfers Overall transfer level: Needs assistance Equipment used: 2 person hand held assist Transfers: Sit to/from Stand Sit to Stand: +2 physical assistance;Mod assist;From elevated surface         General transfer comment: bed elevated >30 inches and able to power up with mod +2 (A). pt demonstrates anterior weight shift toward chair placed in front of patient. Pt with bil UE on chair back to help with upright posture. chair used due to no RW and height of patient. Bil knee block provided, maintains hip flexion in standing despite cues to correct though.  Ambulation/Gait             General Gait Details: Unable this date.   Stairs             Wheelchair Mobility    Modified Rankin (Stroke Patients Only) Modified Rankin (Stroke Patients Only) Pre-Morbid Rankin Score: Moderate disability Modified Rankin: Severe disability     Balance Overall balance assessment: Needs assistance Sitting-balance support: Feet supported Sitting balance-Leahy Scale: Zero Sitting balance - Comments: Strong posterior lean with pt actively resisting leaning anteriorly to sit upright due to noted pain, max-TA to sit statically EOB. Postural control: Posterior lean Standing balance support: Bilateral upper extremity supported;During functional activity Standing balance-Leahy Scale: Poor Standing balance comment: Bil UE support and bil knee block with modAx2 to stand ~30 seconds.  Cognition Arousal/Alertness: Awake/alert Behavior During Therapy: Flat affect Overall Cognitive Status:  Difficult to assess                                 General Comments: pt following 2 step commands. pt able to verbalize pain is all over. pt is able to state girlfriend/ daughters name correctly. pt expressed daughter as his POA.      Exercises General Exercises - Lower Extremity Ankle Circles/Pumps: AROM;Both;10 reps;Supine Quad Sets:  (attempted to cue pt to perform but pt unable due to fatigue)    General Comments General comments (skin integrity, edema, etc.): VSS montiored BP      Pertinent Vitals/Pain Pain Assessment: Faces Faces Pain Scale: Hurts whole lot Pain Location: general Pain Descriptors / Indicators: Discomfort Pain Intervention(s): Monitored during session;Limited activity within patient's tolerance;Premedicated before session;Repositioned    Home Living Family/patient expects to be discharged to:: Private residence Living Arrangements: Alone Available Help at Discharge: Family;Personal care attendant;Available 24 hours/day Type of Home: Apartment Home Access: Level entry   Home Layout: Two level;Bed/bath upstairs Home Equipment: Walker - 4 wheels;Cane - single point;Bedside commode      Prior Function Level of Independence: Independent with assistive device(s)          PT Goals (current goals can now be found in the care plan section) Acute Rehab PT Goals Patient Stated Goal: to get better PT Goal Formulation: With patient Time For Goal Achievement: 01/10/21 Potential to Achieve Goals: Good Progress towards PT goals: Progressing toward goals    Frequency    Min 5X/week      PT Plan Current plan remains appropriate    Co-evaluation PT/OT/SLP Co-Evaluation/Treatment: Yes Reason for Co-Treatment: Complexity of the patient's impairments (multi-system involvement);Necessary to address cognition/behavior during functional activity;For patient/therapist safety;To address functional/ADL transfers PT goals addressed during session:  Mobility/safety with mobility;Balance OT goals addressed during session: ADL's and self-care;Proper use of Adaptive equipment and DME;Strengthening/ROM      AM-PAC PT "6 Clicks" Mobility   Outcome Measure  Help needed turning from your back to your side while in a flat bed without using bedrails?: Total Help needed moving from lying on your back to sitting on the side of a flat bed without using bedrails?: Total Help needed moving to and from a bed to a chair (including a wheelchair)?: Total Help needed standing up from a chair using your arms (e.g., wheelchair or bedside chair)?: Total Help needed to walk in hospital room?: Total Help needed climbing 3-5 steps with a railing? : Total 6 Click Score: 6    End of Session Equipment Utilized During Treatment: Oxygen;Gait belt Activity Tolerance: Patient limited by pain;Patient limited by fatigue Patient left: in bed;with call bell/phone within reach;with bed alarm set Nurse Communication: Mobility status PT Visit Diagnosis: Muscle weakness (generalized) (M62.81);Difficulty in walking, not elsewhere classified (R26.2);Other symptoms and signs involving the nervous system (R29.898);Hemiplegia and hemiparesis;Pain Hemiplegia - Right/Left: Right Hemiplegia - dominant/non-dominant: Dominant Hemiplegia - caused by: Cerebral infarction Pain - part of body:  (back)     Time: [id number] PT Time Calculation (min) (ACUTE ONLY): 24 min  Charges:  $Therapeutic Activity: 8-22 mins                     Moishe Spice, PT, DPT Acute Rehabilitation Services  Pager: [phone] Office: [place] 12/28/2020, 4:38 PM

## 2020-12-29 DIAGNOSIS — J9503 Malfunction of tracheostomy stoma: Secondary | ICD-10-CM

## 2020-12-29 DIAGNOSIS — Z930 Tracheostomy status: Secondary | ICD-10-CM

## 2020-12-29 DIAGNOSIS — J9601 Acute respiratory failure with hypoxia: Secondary | ICD-10-CM | POA: Diagnosis not present

## 2020-12-29 LAB — CBC
HCT: 24.8 % — ABNORMAL LOW (ref 39.0–52.0)
Hemoglobin: 8 g/dL — ABNORMAL LOW (ref 13.0–17.0)
MCH: 27.6 pg (ref 26.0–34.0)
MCHC: 32.3 g/dL (ref 30.0–36.0)
MCV: 85.5 fL (ref 80.0–100.0)
Platelets: 430 10*3/uL — ABNORMAL HIGH (ref 150–400)
RBC: 2.9 MIL/uL — ABNORMAL LOW (ref 4.22–5.81)
RDW: 18.3 % — ABNORMAL HIGH (ref 11.5–15.5)
WBC: 13.6 10*3/uL — ABNORMAL HIGH (ref 4.0–10.5)
nRBC: 0 % (ref 0.0–0.2)

## 2020-12-29 LAB — GLUCOSE, CAPILLARY
Glucose-Capillary: 116 mg/dL — ABNORMAL HIGH (ref 70–99)
Glucose-Capillary: 121 mg/dL — ABNORMAL HIGH (ref 70–99)
Glucose-Capillary: 118 mg/dL — ABNORMAL HIGH (ref 70–99)
Glucose-Capillary: 117 mg/dL — ABNORMAL HIGH (ref 70–99)

## 2020-12-29 LAB — APTT: aPTT: 62 seconds — ABNORMAL HIGH (ref 24–36)

## 2020-12-29 LAB — RENAL FUNCTION PANEL
Albumin: 2.2 g/dL — ABNORMAL LOW (ref 3.5–5.0)
Anion gap: 10 (ref 5–15)
BUN: 91 mg/dL — ABNORMAL HIGH (ref 6–20)
CO2: 26 mmol/L (ref 22–32)
Calcium: 9 mg/dL (ref 8.9–10.3)
Chloride: 113 mmol/L — ABNORMAL HIGH (ref 98–111)
Creatinine, Ser: 3.62 mg/dL — ABNORMAL HIGH (ref 0.61–1.24)
GFR, Estimated: 19 mL/min — ABNORMAL LOW (ref >60)
Glucose, Bld: 131 mg/dL — ABNORMAL HIGH (ref 70–99)
Phosphorus: 4.8 mg/dL — ABNORMAL HIGH (ref 2.5–4.6)
Potassium: 3.7 mmol/L (ref 3.5–5.1)
Sodium: 149 mmol/L — ABNORMAL HIGH (ref 135–145)

## 2020-12-29 LAB — MAGNESIUM: Magnesium: 2.2 mg/dL (ref 1.7–2.4)

## 2020-12-29 MED ORDER — ORAL CARE MOUTH RINSE
15.0000 mL | Freq: Two times a day (BID) | OROMUCOSAL | Status: DC
  Administered 2020-12-29 – 2021-01-19 (×34): 15 mL via OROMUCOSAL

## 2020-12-29 MED ORDER — FREE WATER
200.0000 mL | Status: DC
  Administered 2020-12-29 – 2020-12-30 (×7): 200 mL

## 2020-12-29 MED ORDER — LACTATED RINGERS IV BOLUS
500.0000 mL | Freq: Once | INTRAVENOUS | Status: AC
  Administered 2020-12-29: 500 mL via INTRAVENOUS

## 2020-12-29 MED ORDER — CHLORHEXIDINE GLUCONATE 0.12 % MT SOLN
15.0000 mL | Freq: Two times a day (BID) | OROMUCOSAL | Status: DC
  Administered 2020-12-29 – 2021-01-20 (×43): 15 mL via OROMUCOSAL
  Filled 2020-12-29 (×43): qty 15

## 2020-12-29 MED ORDER — FENTANYL CITRATE (PF) 100 MCG/2ML IJ SOLN: 50.0000 ug | Freq: Once | INTRAMUSCULAR | Status: DC

## 2020-12-29 NOTE — Progress Notes (Signed)
[place] for IV Heparin Indication: DVT  No Known Allergies  Patient Measurements: Height: 6' (182.9 cm) Weight: 108 kg (238 lb 1.6 oz) IBW/kg (Calculated) : 77.6 Heparin Dosing Weight: 99 kg   Vital Signs: Temp: 98.9 F (37.2 C) (08/20 0813) Temp Source: Oral (08/20 0813) BP: 177/89 (08/20 0813) Pulse Rate: 95 (08/20 0813)  Labs: Recent Labs    12/27/20 0234 12/27/20 0235 12/28/20 0518 12/28/20 1612 12/29/20 0440  HGB 8.1*  --  8.7*  --  8.0*  HCT 25.4*  --  26.7*  --  24.8*  PLT 311  --  378  --  430*  APTT 52*  --  52*  --  62*  CREATININE  --    < > 4.17* 3.82* 3.62*   < > = values in this interval not displayed.    Estimated Creatinine Clearance: 28.3 mL/min (A) (by C-G formula based on SCr of 3.62 mg/dL (H)).  Assessment: 60 yr old male with RLE DVT, S/P recent lumbar fusion.  Patient continues on IV heparin and was transitioned to [facility] per nephrology on 8/14.   Heparin level therapeutic at 62 seconds on 2700 units/hr  Goal of Therapy:  aPTT 50-75 seconds per neurosurgery Monitor platelets by anticoagulation protocol: Yes   Plan:  Continue heparin gtt at 2700 units/hr Daily aPTT, CBC, s/s bleeding F/u [facility] plan  Bertis Ruddy, PharmD Clinical Pharmacist Please check AMION for all [place] numbers 12/29/2020 8:28 AM

## 2020-12-29 NOTE — Progress Notes (Signed)
Subjective: Patient reports  still with trach collar difficult to communicate but motions that pain is manageable and legs feel fine  Objective: Vital signs in last 24 hours: Temp:  [97.7 F (36.5 C)-99.2 F (37.3 C)] 98.9 F (37.2 C) (08/20 0813) Pulse Rate:  [80-183] 95 (08/20 0813) Resp:  [0-29] 17 (08/20 0813) BP: (136-177)/(66-90) 177/89 (08/20 0813) SpO2:  [91 %-100 %] 94 % (08/20 0832) FiO2 (%):  [21 %] 21 % (08/20 0832)  Intake/Output from previous day: 08/19 0701 - 08/20 0700 In: 1858.2 [I.V.:593.2; NG/GT:1265] Out: 2225 [Urine:2225] Intake/Output this shift: No intake/output data recorded.  Awake alert oriented moves both extremities well with good strength  Lab Results: Recent Labs    12/28/20 0518 12/29/20 0440  WBC 16.5* 13.6*  HGB 8.7* 8.0*  HCT 26.7* 24.8*  PLT 378 430*   BMET Recent Labs    12/28/20 1612 12/29/20 0440  NA 141 149*  K 3.9 3.7  CL 108 113*  CO2 23 26  GLUCOSE 121* 131*  BUN 91* 91*  CREATININE 3.82* 3.62*  CALCIUM 8.6* 9.0    Studies/Results: No results found.  Assessment/Plan: 60 year old gentleman status post thoracolumbar fusion postoperative kidney failure and pulmonary failure currently improving being managed by pulmonary nephrology appreciate their assistance continue to mobilize with physical and Occupational Therapy.  LOS: 17 days     Elaina Hoops 12/29/2020, 8:44 AM

## 2020-12-29 NOTE — Progress Notes (Signed)
Trach decannulation per order from Margaretha Seeds, MD No noted respiratory issues at this time.

## 2020-12-29 NOTE — Progress Notes (Signed)
[facility] NEPHROLOGY PROGRESS NOTE  Assessment/ Plan:  # Acute kidney injury, non-oliguric: appears to be from a combination of ATN and rhabdomyolysis/pigment nephropathy.  The last dialysis was on 8/17.  He seems to have some renal recovery with increasing urine output and creatinine level trending down.  I will add free water because of mild hypernatremia.  No plan for dialysis today.  Recommend to continue strict ins and outs, daily lab to assess for dialysis need.  Discussed with the primary team, with the patient and his daughter.    #Mild hypernatremia because of free water excretion with renal recovery.  I will add water from the tube.  Monitor lab.  #  Acute postoperative hypoxic/hypercapnic respiratory failure: Status post tracheostomy and currently on ventilator support per CCM.  #  Lumbar spine stenosis/scoliosis status post decompression and fusion surgery: Ongoing management/surveillance by neurosurgery.  # Shock: Secondary to distributive/hypovolemic mechanism; this is resolved and he is currently hypertensive. On amlodipine.   # Acute blood loss anemia: Secondary to surgical associated losses/critical illness, transfusion per primary team.  # Acute multifocal bilateral CVA: Suggestive of embolic disease, supportive management.  Subjective: Seen and examined in ICU.  No new event.  Urine output around 2.2 L.  Remains on trach.  His daughter at bedside. Objective Vital signs in last 24 hours: Vitals:   12/29/20 0412 12/29/20 0440 12/29/20 0813 12/29/20 0832  BP: (!) 164/84  (!) 177/89   Pulse: (!) 183  95 94  Resp: (!) _0 Temp: 99.2 F (37.3 C)  98.9 F (37.2 C)   TempSrc: Oral  Oral   SpO2: 91% 97% 96% 94%  Weight:      Height:       Weight change:   Intake/Output Summary (Last 24 hours) at 12/29/2020 1002 Last data filed at 12/29/2020 0800 Gross per 24 hour  Intake 1855.68 ml  Output 2775 ml  Net -919.32 ml        Labs: Basic  Metabolic Panel: Recent Labs  Lab 12/28/20 0518 12/28/20 1612 12/29/20 0440  NA 145 141 149*  K 3.9 3.9 3.7  CL 109 108 113*  CO2 _1 GLUCOSE 128* 121* 131*  BUN 89* 91* 91*  CREATININE 4.17* 3.82* 3.62*  CALCIUM 9.0 8.6* 9.0  PHOS 5.2* 5.2* 4.8*    Liver Function Tests: Recent Labs  Lab 12/28/20 0518 12/28/20 1612 12/29/20 0440  ALBUMIN 2.2* 2.2* 2.2*    No results for input(s): LIPASE, AMYLASE in the last 168 hours. No results for input(s): AMMONIA in the last 168 hours. CBC: Recent Labs  Lab 12/25/20 0746 12/25/20 1504 12/26/20 0639 12/27/20 0234 12/28/20 0518 12/29/20 0440  WBC 11.0*  --  13.4* 14.0* 16.5* 13.6*  HGB 6.7*   < > 8.2* 8.1* 8.7* 8.0*  HCT 22.1*   < > 26.0* 25.4* 26.7* 24.8*  MCV 90.6  --  86.1 84.9 85.0 85.5  PLT 216  --  311 311 378 430*   < > = values in this interval not displayed.    Cardiac Enzymes: No results for input(s): CKTOTAL, CKMB, CKMBINDEX, TROPONINI in the last 168 hours. CBG: Recent Labs  Lab 12/28/20 1524 12/28/20 1931 12/28/20 2353 12/29/20 0353 12/29/20 0810  GLUCAP 119* 116* 116* 116* 121*     Iron Studies: No results for input(s): IRON, TIBC, TRANSFERRIN, FERRITIN in the last 72 hours. Studies/Results: No results found.  Medications: Infusions:  sodium chloride Stopped (12/25/20 1749)  feeding supplement (NEPRO CARB STEADY) 1,000 mL (12/28/20 0433)   heparin 2,700 Units/hr (12/29/20 9480)    Scheduled Medications:  albuterol  2.5 mg Nebulization TID   alteplase  2 mg Intracatheter Once   amLODipine  2.5 mg Per Tube Daily   aspirin  81 mg Per Tube Daily   bacitracin   Topical Daily   chlorhexidine gluconate (MEDLINE KIT)  15 mL Mouth Rinse BID   Chlorhexidine Gluconate Cloth  6 each Topical Daily   cholecalciferol  2,000 Units Per Tube BID   ciprofloxacin  1 drop Left Eye Q4H while awake   docusate  100 mg Per Tube BID   feeding supplement (PROSource TF)  45 mL Per Tube TID   folic acid  1  mg Per Tube Daily   free water  200 mL Per Tube Q4H   gabapentin  300 mg Per Tube Daily   lamoTRIgine  100 mg Per Tube BID   levETIRAcetam  500 mg Per Tube BID   mouth rinse  15 mL Mouth Rinse 10 times per day   pantoprazole sodium  40 mg Per Tube QHS   polyethylene glycol  17 g Per Tube Daily   QUEtiapine  200 mg Per Tube QHS   sertraline  100 mg Per Tube QPC breakfast   sodium chloride flush  10-40 mL Intracatheter Q12H   sodium chloride flush  3 mL Intravenous Q12H   thiamine injection  100 mg Intravenous Daily   vitamin B-12  1,000 mcg Per Tube Daily   zinc sulfate  220 mg Per Tube Daily    have reviewed scheduled and prn medications.  Physical Exam: General: Comfortable, has tracheostomy. Heart:RRR, s1s2 nl Lungs: Coarse breath sound bilateral Abdomen:soft, Non-tender, non-distended Extremities:No edema Dialysis Access: Right IJ HD catheter.  Keith Dixon 12/29/2020,10:02 AM  LOS: 17 days

## 2020-12-29 NOTE — Progress Notes (Signed)
RT came by to assess pt's trach and RT was unable to pass suction catheter. RT changed inner cannula and was still unable to pass catheter. MD was notified.

## 2020-12-29 NOTE — Progress Notes (Signed)
NAME:  ZAKARIA BUMAN, MRN:  [id number], DOB:  May 12, 1961, LOS: 60 ADMISSION DATE:  12/12/2020, CONSULTATION DATE:  12/29/20  REFERRING MD:  Grandville Silos, CHIEF COMPLAINT:   respiratory depression  History of Present Illness:  Leavy Fulford is a 60 y/o M with PMH significant for severe degenerative scoliosis with multi-level listhesis, anxiety, bipolar disorder, PTSD, seizures, OSA and tobacco use who was admitted to neurosurgery for L1-2, L2-3, L3-4, L4-5 DLIFs initially and subsequent T10-pelvis decompression and fusion.   His initial surgery was 8/3 and was slow to wake up, after second  surgery 8/4  (10hr procedure) pt was again slow to awaken post-anesthesia, so decision was made to leave patient intubated overnight.    Significant Hospital Events:  8/3 Admit to neurosurgery, underwent  anterior lateral Interbody fusion of L1-L5, notably had difficult foley insertion requiring urethral dilation 8/4 Stage 2 posterior decompression and fusion, left intubated post-procedure and PCCM consulted 8/5 AKI seen on am labs with evidence of volume depletion on POC [place] 8/6: Patient AKI was due to acute rhabdomyolysis in the setting of prolonged surgery, he remained oliguric.  Ultrasound lower extremity showed acute right lower extremity DVT and left IJ DVT, IVC filter placed 8/7: Patient serum creatinine continues to trend up, he remained oliguric, started on CRRT Head CT showed small multiple bilateral strokes.  EEG was placed  8/9>. No seizures per EEG, remains on Levo, Propofol and fentanyl with good vent synchrony. Remains on CVVH with UF goal of 50 cc per hour, but increasing pressor needs this am  8/11: off pressors, mental status precluding extubation 8/12: Tunneled HD line placement 8/13: Tolerating some PSV on fentanyl 8/13 MRI/A brain > multiple acute/subacute CVA in the cerebellum, left > right thalami, bilateral cerebral hemispheres.  No abnormal findings in the neck, normal angiography 8/14  Began HD 8/15 Extubated and declined to due to stridor and required reintubation.  8/16 S/p tracheostomy. Hgb 6.7 transfused 1u pRBC likely from actively draining serosanguinous fluid from surgical site on back. NSGY expressed fluid from surgical site until resolution. 8/17 UOP improving 8/20 Tolerating trach collar since 8/17. Developed shortness of breath and unable to pass in-line suction  Interim History / Subjective:  Tolerating trach collar since 8/17. Developed shortness of breath and unable to pass in-line suction.   Objective   Blood pressure (!) 177/89, pulse 95, temperature 98.9 F (37.2 C), temperature source Oral, resp. rate 17, height 6' (1.829 m), weight 108 kg, SpO2 94 %.    FiO2 (%):  [21 %] 21 %   Intake/Output Summary (Last 24 hours) at 12/29/2020 0919 Last data filed at 12/29/2020 0800 Gross per 24 hour  Intake 1937.73 ml  Output 2775 ml  Net -837.27 ml   Filed Weights   12/26/20 0630 12/26/20 0956 12/28/20 0500  Weight: 112.8 kg 109.8 kg 108 kg   Physical Exam: General: Chronically ill-appearing, no acute distress HENT: [place], AT, OP clear, MMM Neck: Trach in place, c/d/I. Visualized end of trach which demonstrated partial airway obstruction due to position Eyes: EOMI, no scleral icterus Respiratory: Clear to auscultation bilaterally.  No crackles, wheezing or rales Cardiovascular: RRR, -M/R/G, no JVD GI: BS+, soft, nontender Extremities:-Edema,-tenderness Neuro: Awake and alert, follows commands, CNII-XII grossly intact   Assessment & Plan:  Acute post operative hypoxic and hypercapnic respiratory failure Malpositioned tracheostomy 8/20 S/p tracheostomy 8/16.  Tolerated trach collar since 8/17 -Trial of decannulation -If fails, will plan to replace with #6 XLT  Per primary team Acute encephalopathy,  agitation - resolved Acute oliguric renal failure: secondary to rhabdomyolysis and ATN this admission. Nephrology following Hypertension Acute on chronic  anemia. Likely related to surgical losses, critical illness, and surgical wound site. Hgb stable.  Acute bilateral cortical and subcortical MCA/ACA watershed territory infarcts Acute right lower extremity DVT, acute left IJ DVT s/p IVC filter placement: anticogulation Hx of seizure disorder. No epileptic activity on EEG Lumbar spinal stenosis s/p decompression and fusion High risk malnutritio Deep pressure injury of left heel. Chest hematoma.  Best Practice    Per primary  Time: 52 min  Rodman Pickle, M.D. [facility] Pulmonary/Critical Care Medicine 12/29/2020 9:20 AM   See Amion for personal pager For hours between 7 PM to 7 AM, please call Elink for urgent questions

## 2020-12-29 NOTE — Progress Notes (Signed)
PROGRESS NOTE    [name]  ZLD:[id number] DOB: 1960/10/06 DOA: 12/12/2020 PCP: Reubin Milan, MD  Primary service: Neurosurgery  Brief Narrative: Keith Dixon is a 60 y.o. male with history of hypertension, obesity, PTSD, seizures, bipolar disorder.  Patient presented secondary to worsening leg pain and neurogenic claudication symptoms for thoracolumbar decompression and fusion.  Postop, patient developed persistent respiratory failure requiring admission to the ICU and continuation of ventilator support.  During admission, patient developed oliguric AKI requiring CRRT, multiple bilateral strokes with neurology consultation and management.  Patient required tracheostomy in order to come off the ventilator.    ICU Significant Events: 8/3 Admit to neurosurgery, underwent  anterior lateral Interbody fusion of L1-L5, notably had difficult foley insertion requiring urethral dilation 8/4 Stage 2 posterior decompression and fusion, left intubated post-procedure and PCCM consulted 8/5 AKI seen on am labs with evidence of volume depletion on POC [place] 8/6: Patient AKI was due to acute rhabdomyolysis in the setting of prolonged surgery, he remained oliguric.  Ultrasound lower extremity showed acute right lower extremity DVT and left IJ DVT, IVC filter placed 8/7: Patient serum creatinine continues to trend up, he remained oliguric, started on CRRT Head CT showed small multiple bilateral strokes.  EEG was placed  8/9>. No seizures per EEG, remains on Levo, Propofol and fentanyl with good vent synchrony. Remains on CVVH with UF goal of 50 cc per hour, but increasing pressor needs this am  8/11: off pressors, mental status precluding extubation 8/12: Tunneled HD line placement 8/13: Tolerating some PSV on fentanyl 8/13 MRI/A brain > multiple acute/subacute CVA in the cerebellum, left > right thalami, bilateral cerebral hemispheres.  No abnormal findings in the neck, normal angiography 8/14  Began HD 8/15 Extubated and declined to due to stridor and required reintubation.  8/16 S/p tracheostomy. Hgb 6.7 transfused 1u pRBC likely from actively draining serosanguinous fluid from surgical site on back. NSGY expressed fluid from surgical site until resolution. 8/17 UOP improving  Assessment & Plan:   Active Problems:   Lumbar spine scoliosis   Spinal stenosis of lumbar region   Arterial line in place   Encounter for central line placement   Cerebral thrombosis with cerebral infarction   Altered mental status   Respiratory failure ([place])   Tracheostomy in place [facility])   Acute respiratory failure with hypoxia and hypercapnia Postoperative respiratory failure Patient was initially managed intubated on 8/4, managed on via ventilator and extubated on 8/15. Unfortunately, patient developed decline in setting of stridor, requiring same-day reintubation and subsequent tracheostomy on 8/16. Currently weaned off the ventilator. -Pulmonology for tracheostomy care  Acute metabolic encephalopathy Resolved.  AKI with oliguria Likely secondary to rhabdomyolysis/ATN. Nephrology consulted and managed with CRRT/hemodialysis. Currently improving. UOP of 2.225 L over the last 24 hours. Management per nephrology  Spinal stenosis s/p thoracolumbar fusion/decompression -Management per primary.   Acute RLE/left IJ DVT Patient started on heparin IV. Can transition to Eliquis prior to discharge  Acute bilateral cortical MCA/ACA watershed cortical/subcortical left thalamic infarcts Neurology consulted. Concern for for perioperative hypotension in setting of intracranial stenosis but could not rule out cardioembolic source. Carotid dopplers with 1-39% stenosis, MRA head/neck negative, TCD with bubble was negative for PFO, Transthoracic Echocardiogram significant for no thrombus and normal left atrial size. LDL of 30 and hemoglobin A1C of 5.8%. final recommendations for Eliquis once ready to change to  PO anticoagulation.  Acute on chronic anemia Perioperative blood loss Patient has required 5 units of PRBC. Stable.  Seizure disorder Continue Keppra  Poor nutrition In setting of critically ill status, recent intubation. Patient is currently with a CorTrak feeding tube and on tube feeds. Speech therapy consulted for swallow evaluation. Dietitian is consulted for nutrition management. -Continue tube feeds at 55 mL/hr -Follow-up SLP recommendations for possible PO intake  Chest hematoma Stable on heparin IV  Pressure injury Left heel, unsure if present on admission.   DVT prophylaxis: Per primary Code Status:   Code Status: Full Code Family Communication: Daughter at bedside Disposition Plan: Per primary. Appears patient is being set up for possible CIR discharge   Consultants:  PCCM Nephrology Neurology Urology General medicine  Procedures:  ANTERIOR LATERAL INTERBODY FUSION L1-L5 (12/12/2020) CYSTOSCOPY/URETHRAL DILATION/FOLEY INSERTION (12/12/2020) THORACIC - ILIAC FUSION; L5-S1 TRANSFORAMINAL LUMBAR INTERBODY FUSION (12/13/2020) INTUBATION/MECHANICAL VENTILATION (8/4-8/15) IVC FILTER PLACEMENT (12/16/2020) EEG (12/16/2020)  Impression and clinical correlation: This EEG was obtained while unresponsive and is abnormal due to moderate diffuse slowing indicative of global cerebral dysfunction.  LTM EEG (8/7-8/8)  IMPRESSION: This technically difficult study was initially suggestive of moderate diffuse encephalopathy, nonspecific etiology. After around 1830 on 12/16/2020, eeg was suggestive of profound diffuse encephalopathy, likely due to sedation. No seizures or epileptiform discharges were seen throughout the recording.  LTM EEG (8/8-8/9).   IMPRESSION: This study is suggestive of moderate to severe diffuse encephalopathy, nonspecific etiology. No seizures or epileptiform discharges were seen throughout the recording.  TUNNELED HD LINE PLACEMENT (12/21/2020) [place] (12/21/2020) INTUBATION/MECHANICAL VENTILATION (8/15-8/16/2022) TRACHEOSTOMY (12/25/2020)   Antimicrobials: Vancomycin IV Cefazolin IV    Subjective: No concerns. No chest pain or dyspnea  Objective: BP (!) 164/84 (BP Location: Left Arm)   Pulse (!) 183   Temp 99.2 F (37.3 C) (Oral)   Resp (!) 25   Ht 6' (1.829 m)   Wt 108 kg   SpO2 97%   BMI 32.29 kg/m   Examination:  General exam: Appears calm and comfortable and in no acute distress. Conversant Respiratory: Clear to auscultation on left with diminished breath sounds on right. Respiratory effort normal with no intercostal retractions or use of accessory muscles Cardiovascular: S1 & S2 heard, RRR. No murmurs, rubs, gallops or clicks. Gastrointestinal: Abdomen is nondistended, soft and nontender. No masses felt. Normal bowel sounds heard Neurologic: No focal neurological deficits Musculoskeletal: No calf tenderness Skin: No cyanosis. No new rashes Psychiatry: Alert and oriented. Memory intact. Mood & affect appropriate   Data Reviewed: I have personally reviewed following labs and imaging studies  CBC Lab Results  Component Value Date   WBC 13.6 (H) 12/29/2020   RBC 2.90 (L) 12/29/2020   HGB 8.0 (L) 12/29/2020   HCT 24.8 (L) 12/29/2020   MCV 85.5 12/29/2020   MCH 27.6 12/29/2020   PLT 430 (H) 12/29/2020   MCHC 32.3 12/29/2020   RDW 18.3 (H) 12/29/2020   LYMPHSABS 0.7 12/19/2020   MONOABS 0.7 12/19/2020   EOSABS 0.2 12/19/2020   BASOSABS 0.0 15/09/6977    Last metabolic panel Lab Results  Component Value Date   NA 149 (H) 12/29/2020   K 3.7 12/29/2020   CL 113 (H) 12/29/2020   CO2 26 12/29/2020   BUN 91 (H) 12/29/2020   CREATININE 3.62 (H) 12/29/2020   GLUCOSE 131 (H) 12/29/2020   GFRNONAA 19 (L) 12/29/2020   GFRAA >60 04/25/2019   CALCIUM 9.0 12/29/2020   PHOS 4.8 (H) 12/29/2020   PROT 5.0 (L) 12/19/2020   ALBUMIN 2.2 (L) 12/29/2020   BILITOT 0.7 12/19/2020   ALKPHOS  294 (H)  12/19/2020   AST 79 (H) 12/19/2020   ALT 9 12/19/2020   ANIONGAP 10 12/29/2020    CBG (last 3)  Recent Labs    12/28/20 1931 12/28/20 2353 12/29/20 0353  GLUCAP 116* 116* 116*     GFR: Estimated Creatinine Clearance: 28.3 mL/min (A) (by C-G formula based on SCr of 3.62 mg/dL (H)).  Coagulation Profile: Recent Labs  Lab 12/25/20 0434  INR 1.2    No results found for this or any previous visit (from the past 240 hour(s)).      Radiology Studies: No results found.    Scheduled Meds:  albuterol  2.5 mg Nebulization TID   alteplase  2 mg Intracatheter Once   amLODipine  2.5 mg Per Tube Daily   aspirin  81 mg Per Tube Daily   bacitracin   Topical Daily   chlorhexidine gluconate (MEDLINE KIT)  15 mL Mouth Rinse BID   Chlorhexidine Gluconate Cloth  6 each Topical Daily   cholecalciferol  2,000 Units Per Tube BID   ciprofloxacin  1 drop Left Eye Q4H while awake   docusate  100 mg Per Tube BID   feeding supplement (PROSource TF)  45 mL Per Tube TID   folic acid  1 mg Per Tube Daily   gabapentin  300 mg Per Tube Daily   lamoTRIgine  100 mg Per Tube BID   levETIRAcetam  500 mg Per Tube BID   mouth rinse  15 mL Mouth Rinse 10 times per day   pantoprazole sodium  40 mg Per Tube QHS   polyethylene glycol  17 g Per Tube Daily   QUEtiapine  200 mg Per Tube QHS   sertraline  100 mg Per Tube QPC breakfast   sodium chloride flush  10-40 mL Intracatheter Q12H   sodium chloride flush  3 mL Intravenous Q12H   thiamine injection  100 mg Intravenous Daily   vitamin B-12  1,000 mcg Per Tube Daily   zinc sulfate  220 mg Per Tube Daily   Continuous Infusions:  sodium chloride Stopped (12/25/20 1749)   feeding supplement (NEPRO CARB STEADY) 1,000 mL (12/28/20 0433)   heparin 2,700 Units/hr (12/29/20 0643)     LOS: 17 days     Cordelia Poche, MD [facility] 12/29/2020, 7:24 AM  If 7PM-7AM, please contact night-coverage [email or web address]

## 2020-12-30 ENCOUNTER — Inpatient Hospital Stay (HOSPITAL_COMMUNITY): Payer: No Typology Code available for payment source

## 2020-12-30 DIAGNOSIS — Z9889 Other specified postprocedural states: Secondary | ICD-10-CM | POA: Diagnosis not present

## 2020-12-30 DIAGNOSIS — R509 Fever, unspecified: Secondary | ICD-10-CM

## 2020-12-30 LAB — URINALYSIS, ROUTINE W REFLEX MICROSCOPIC
Bilirubin Urine: NEGATIVE
Glucose, UA: NEGATIVE mg/dL
Hgb urine dipstick: NEGATIVE
Ketones, ur: NEGATIVE mg/dL
Leukocytes,Ua: NEGATIVE
Nitrite: NEGATIVE
Protein, ur: NEGATIVE mg/dL
Specific Gravity, Urine: 1.014 (ref 1.005–1.030)
pH: 6 (ref 5.0–8.0)

## 2020-12-30 LAB — RENAL FUNCTION PANEL
Albumin: 2.2 g/dL — ABNORMAL LOW (ref 3.5–5.0)
Anion gap: 10 (ref 5–15)
BUN: 87 mg/dL — ABNORMAL HIGH (ref 6–20)
CO2: 24 mmol/L (ref 22–32)
Calcium: 8.6 mg/dL — ABNORMAL LOW (ref 8.9–10.3)
Chloride: 116 mmol/L — ABNORMAL HIGH (ref 98–111)
Creatinine, Ser: 3.25 mg/dL — ABNORMAL HIGH (ref 0.61–1.24)
GFR, Estimated: 21 mL/min — ABNORMAL LOW (ref >60)
Glucose, Bld: 110 mg/dL — ABNORMAL HIGH (ref 70–99)
Phosphorus: 4.2 mg/dL (ref 2.5–4.6)
Potassium: 4.2 mmol/L (ref 3.5–5.1)
Sodium: 150 mmol/L — ABNORMAL HIGH (ref 135–145)

## 2020-12-30 LAB — CBC
HCT: 25.3 % — ABNORMAL LOW (ref 39.0–52.0)
Hemoglobin: 7.9 g/dL — ABNORMAL LOW (ref 13.0–17.0)
MCH: 27.5 pg (ref 26.0–34.0)
MCHC: 31.2 g/dL (ref 30.0–36.0)
MCV: 88.2 fL (ref 80.0–100.0)
Platelets: 453 10*3/uL — ABNORMAL HIGH (ref 150–400)
RBC: 2.87 MIL/uL — ABNORMAL LOW (ref 4.22–5.81)
RDW: 18.6 % — ABNORMAL HIGH (ref 11.5–15.5)
WBC: 15.1 10*3/uL — ABNORMAL HIGH (ref 4.0–10.5)
nRBC: 0 % (ref 0.0–0.2)

## 2020-12-30 LAB — GLUCOSE, CAPILLARY
Glucose-Capillary: 111 mg/dL — ABNORMAL HIGH (ref 70–99)
Glucose-Capillary: 112 mg/dL — ABNORMAL HIGH (ref 70–99)
Glucose-Capillary: 113 mg/dL — ABNORMAL HIGH (ref 70–99)
Glucose-Capillary: 115 mg/dL — ABNORMAL HIGH (ref 70–99)
Glucose-Capillary: 118 mg/dL — ABNORMAL HIGH (ref 70–99)

## 2020-12-30 LAB — APTT: aPTT: 52 seconds — ABNORMAL HIGH (ref 24–36)

## 2020-12-30 LAB — MAGNESIUM: Magnesium: 1.8 mg/dL (ref 1.7–2.4)

## 2020-12-30 MED ORDER — FREE WATER
300.0000 mL | Status: DC
  Administered 2020-12-30 – 2020-12-31 (×6): 300 mL

## 2020-12-30 MED ORDER — RACEPINEPHRINE HCL 2.25 % IN NEBU: 0.5000 mL | INHALATION_SOLUTION | Freq: Three times a day (TID) | RESPIRATORY_TRACT | Status: DC | PRN

## 2020-12-30 NOTE — Progress Notes (Addendum)
NAME:  Keith Dixon, MRN:  [id number], DOB:  1960-09-23, LOS: 11 ADMISSION DATE:  12/12/2020, CONSULTATION DATE:  12/30/20  REFERRING MD:  Grandville Silos, CHIEF COMPLAINT:   respiratory depression  History of Present Illness:  Keith Dixon is a 60 y/o M with PMH significant for severe degenerative scoliosis with multi-level listhesis, anxiety, bipolar disorder, PTSD, seizures, OSA and tobacco use who was admitted to neurosurgery for L1-2, L2-3, L3-4, L4-5 DLIFs initially and subsequent T10-pelvis decompression and fusion.   His initial surgery was 8/3 and was slow to wake up, after second  surgery 8/4  (10hr procedure) pt was again slow to awaken post-anesthesia, so decision was made to leave patient intubated overnight.    Significant Hospital Events:  8/3 Admit to neurosurgery, underwent  anterior lateral Interbody fusion of L1-L5, notably had difficult foley insertion requiring urethral dilation 8/4 Stage 2 posterior decompression and fusion, left intubated post-procedure and PCCM consulted 8/5 AKI seen on am labs with evidence of volume depletion on POC [place] 8/6: Patient AKI was due to acute rhabdomyolysis in the setting of prolonged surgery, he remained oliguric.  Ultrasound lower extremity showed acute right lower extremity DVT and left IJ DVT, IVC filter placed 8/7: Patient serum creatinine continues to trend up, he remained oliguric, started on CRRT Head CT showed small multiple bilateral strokes.  EEG was placed  8/9>. No seizures per EEG, remains on Levo, Propofol and fentanyl with good vent synchrony. Remains on CVVH with UF goal of 50 cc per hour, but increasing pressor needs this am  8/11: off pressors, mental status precluding extubation 8/12: Tunneled HD line placement 8/13: Tolerating some PSV on fentanyl 8/13 MRI/A brain > multiple acute/subacute CVA in the cerebellum, left > right thalami, bilateral cerebral hemispheres.  No abnormal findings in the neck, normal angiography 8/14  Began HD 8/15 Extubated and declined to due to stridor and required reintubation.  8/16 S/p tracheostomy. Hgb 6.7 transfused 1u pRBC likely from actively draining serosanguinous fluid from surgical site on back. NSGY expressed fluid from surgical site until resolution. 8/17 UOP improving 8/20 Tolerating trach collar since 8/17. Developed shortness of breath and unable to pass in-line suction 8/21 Decannulated yesterday, saturating well on RA  Interim History / Subjective:    Doing well on RA s/p decannulation yesterday, still with some mild upper airway stridor, but no distress  Objective   Blood pressure (!) 160/78, pulse (!) 105, temperature 100.3 F (37.9 C), temperature source Oral, resp. rate (!) 22, height 6' (1.829 m), weight 108 kg, SpO2 92 %.    FiO2 (%):  [21 %] 21 %   Intake/Output Summary (Last 24 hours) at 12/30/2020 0739 Last data filed at 12/29/2020 1531 Gross per 24 hour  Intake 1111.38 ml  Output 1300 ml  Net -188.62 ml    Filed Weights   12/26/20 0630 12/26/20 0956 12/28/20 0500  Weight: 112.8 kg 109.8 kg 108 kg   General:  well-nourished M, awake, lying in bed in no distress HEENT: MM pink/moist, stoma bandaged, no bleeding or drainage, voice weak Neuro: awake, alert, moving all extremities, answering questions appropriately  CV: s1s2 rrr, no m/r/g PULM:  lower lung fields are clear, mild upper airway stridor, no tachypnea or accessory muscle use on RA GI: soft, bsx4 active  Extremities: warm/dry, no edema  Skin: no rashes or lesions   Assessment & Plan:  Acute post operative hypoxic and hypercapnic respiratory failure Malpositioned tracheostomy 8/20 S/p tracheostomy 8/16.  Tolerated trach collar since 8/17  then decannulated 8/21 P: -doing ok this AM, mild remaining upper airway stridor, continue to keep stoma bandaged, clean and dry -If fails, will plan to replace with #6 XLT -if re-opens after two weeks will need referral to ENT  Per primary  team Acute encephalopathy, agitation - resolved Acute oliguric renal failure: secondary to rhabdomyolysis and ATN this admission. Nephrology following Hypertension Acute on chronic anemia. Likely related to surgical losses, critical illness, and surgical wound site. Hgb stable.  Acute bilateral cortical and subcortical MCA/ACA watershed territory infarcts Acute right lower extremity DVT, acute left IJ DVT s/p IVC filter placement: anticogulation Hx of seizure disorder. No epileptic activity on EEG Lumbar spinal stenosis s/p decompression and fusion High risk malnutritio Deep pressure injury of left heel. Chest hematoma.  Best Practice    Per primary    [name] [name], PA-C [place] [facility] See Amion for pager If no response to pager , please call [phone] until 7pm After 7:00 pm call Elink  [id number]?[place]

## 2020-12-30 NOTE — Progress Notes (Addendum)
[facility] NEPHROLOGY PROGRESS NOTE  Assessment/ Plan:  # Acute kidney injury, non-oliguric: appears to be from a combination of ATN and rhabdomyolysis/pigment nephropathy.  The last dialysis was on 8/17.  He seems to have some renal recovery with increasing urine output and creatinine level trending down.  Increase free water for the management of hypernatremia.  The tracheostomy site is capped and seems like he is clinically improving.  No need for dialysis today and plan to discontinue HD catheter if it continue to improve in the next few days. Recommend to continue strict ins and outs, daily lab.     #Mild hypernatremia because of free water excretion with renal recovery.  Increase free water from the feeding tube.  Monitor lab.  #  Acute postoperative hypoxic/hypercapnic respiratory failure: The tracheostomy is capped now and off of ventilator.  Seems like he is doing well.  #  Lumbar spine stenosis/scoliosis status post decompression and fusion surgery: Ongoing management/surveillance by neurosurgery.  # Shock: Secondary to distributive/hypovolemic mechanism; this is resolved and he is currently hypertensive. On amlodipine.   # Acute blood loss anemia: Secondary to surgical associated losses/critical illness, transfusion per primary team.  # Acute multifocal bilateral CVA: Suggestive of embolic disease, supportive management.  #Acute febrile illness: Per primary team.  Discussed with the bedside nurse.  Subjective: Seen and examined in ICU.  Off of ventilator and tracheostomy is capped.  Alert awake.  Urine output is around 1.3 L documented.  He had temperature of 101 last night, currently afebrile.  Objective Vital signs in last 24 hours: Vitals:   12/30/20 0340 12/30/20 0500 12/30/20 0707 12/30/20 1101  BP: (!) 160/78  (!) 166/80   Pulse: (!) 105  99 99  Resp: (!) 22  (!) 31 (!) 28  Temp: (!) 101 F (38.3 C) 100.3 F (37.9 C) 99.5 F (37.5 C)   TempSrc:  Oral Oral Oral   SpO2: 92%  96% 93%  Weight:      Height:       Weight change:   Intake/Output Summary (Last 24 hours) at 12/30/2020 1159 Last data filed at 12/30/2020 0700 Gross per 24 hour  Intake 1102.79 ml  Output 750 ml  Net 352.79 ml        Labs: Basic Metabolic Panel: Recent Labs  Lab 12/28/20 1612 12/29/20 0440 12/30/20 0334  NA 141 149* 150*  K 3.9 3.7 4.2  CL 108 113* 116*  CO2 '23 26 24  '$ GLUCOSE 121* 131* 110*  BUN 91* 91* 87*  CREATININE 3.82* 3.62* 3.25*  CALCIUM 8.6* 9.0 8.6*  PHOS 5.2* 4.8* 4.2    Liver Function Tests: Recent Labs  Lab 12/28/20 1612 12/29/20 0440 12/30/20 0334  ALBUMIN 2.2* 2.2* 2.2*    No results for input(s): LIPASE, AMYLASE in the last 168 hours. No results for input(s): AMMONIA in the last 168 hours. CBC: Recent Labs  Lab 12/26/20 0639 12/27/20 0234 12/28/20 0518 12/29/20 0440 12/30/20 0334  WBC 13.4* 14.0* 16.5* 13.6* 15.1*  HGB 8.2* 8.1* 8.7* 8.0* 7.9*  HCT 26.0* 25.4* 26.7* 24.8* 25.3*  MCV 86.1 84.9 85.0 85.5 88.2  PLT 311 311 378 430* 453*    Cardiac Enzymes: No results for input(s): CKTOTAL, CKMB, CKMBINDEX, TROPONINI in the last 168 hours. CBG: Recent Labs  Lab 12/29/20 0353 12/29/20 0810 12/29/20 1126 12/29/20 1551 12/30/20 0752  GLUCAP 116* 121* 118* 117* 112*     Iron Studies: No results for input(s): IRON, TIBC, TRANSFERRIN, FERRITIN in the  last 72 hours. Studies/Results: No results found.  Medications: Infusions:  sodium chloride Stopped (12/25/20 1749)   feeding supplement (NEPRO CARB STEADY) 1,000 mL (12/30/20 0511)   heparin 2,700 Units/hr (12/30/20 0255)    Scheduled Medications:  alteplase  2 mg Intracatheter Once   amLODipine  2.5 mg Per Tube Daily   aspirin  81 mg Per Tube Daily   bacitracin   Topical Daily   chlorhexidine  15 mL Mouth Rinse BID   Chlorhexidine Gluconate Cloth  6 each Topical Daily   cholecalciferol  2,000 Units Per Tube BID   ciprofloxacin  1 drop Left  Eye Q4H while awake   docusate  100 mg Per Tube BID   feeding supplement (PROSource TF)  45 mL Per Tube TID   folic acid  1 mg Per Tube Daily   free water  200 mL Per Tube Q4H   gabapentin  300 mg Per Tube Daily   lamoTRIgine  100 mg Per Tube BID   levETIRAcetam  500 mg Per Tube BID   mouth rinse  15 mL Mouth Rinse q12n4p   pantoprazole sodium  40 mg Per Tube QHS   polyethylene glycol  17 g Per Tube Daily   QUEtiapine  200 mg Per Tube QHS   sertraline  100 mg Per Tube QPC breakfast   sodium chloride flush  10-40 mL Intracatheter Q12H   sodium chloride flush  3 mL Intravenous Q12H   thiamine injection  100 mg Intravenous Daily   vitamin B-12  1,000 mcg Per Tube Daily   zinc sulfate  220 mg Per Tube Daily    have reviewed scheduled and prn medications.  Physical Exam: General: Comfortable, tracheostomy capped.Marland Kitchen Heart:RRR, s1s2 nl Lungs: Coarse breath sound anteriorly. Abdomen:soft, Non-tender, non-distended Extremities:No edema Dialysis Access: Right IJ HD catheter.  Mercades Bajaj Prasad Shyniece Scripter 12/30/2020,11:59 AM  LOS: 18 days

## 2020-12-30 NOTE — Progress Notes (Signed)
Stoma care done. Pt tolerated well.

## 2020-12-30 NOTE — Progress Notes (Addendum)
PCCM Plan of Care Note  S/p decannulation - clinically stable on RA --Keep current dressing in place for next 24 hrs --After 24 hrs place a sterile bandage over stoma until completely closed.   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 Will sign off but glad to be reinvolved as condition changes.  [place]

## 2020-12-30 NOTE — Progress Notes (Signed)
PROGRESS NOTE    LEM LANS  [id number] DOB: 1960/08/05 DOA: 12/12/2020 PCP: Reubin Milan, MD  Primary service: Neurosurgery  Brief Narrative: Keith Dixon is a 60 y.o. male with history of hypertension, obesity, PTSD, seizures, bipolar disorder.  Patient presented secondary to worsening leg pain and neurogenic claudication symptoms for thoracolumbar decompression and fusion.  Postop, patient developed persistent respiratory failure requiring admission to the ICU and continuation of ventilator support.  During admission, patient developed oliguric AKI requiring CRRT, multiple bilateral strokes with neurology consultation and management.  Patient required tracheostomy in order to come off the ventilator.    ICU Significant Events: 8/3 Admit to neurosurgery, underwent  anterior lateral Interbody fusion of L1-L5, notably had difficult foley insertion requiring urethral dilation 8/4 Stage 2 posterior decompression and fusion, left intubated post-procedure and PCCM consulted 8/5 AKI seen on am labs with evidence of volume depletion on POC [place] 8/6: Patient AKI was due to acute rhabdomyolysis in the setting of prolonged surgery, he remained oliguric.  Ultrasound lower extremity showed acute right lower extremity DVT and left IJ DVT, IVC filter placed 8/7: Patient serum creatinine continues to trend up, he remained oliguric, started on CRRT Head CT showed small multiple bilateral strokes.  EEG was placed  8/9>. No seizures per EEG, remains on Levo, Propofol and fentanyl with good vent synchrony. Remains on CVVH with UF goal of 50 cc per hour, but increasing pressor needs this am  8/11: off pressors, mental status precluding extubation 8/12: Tunneled HD line placement 8/13: Tolerating some PSV on fentanyl 8/13 MRI/A brain > multiple acute/subacute CVA in the cerebellum, left > right thalami, bilateral cerebral hemispheres.  No abnormal findings in the neck, normal angiography 8/14  Began HD 8/15 Extubated and declined to due to stridor and required reintubation.  8/16 S/p tracheostomy. Hgb 6.7 transfused 1u pRBC likely from actively draining serosanguinous fluid from surgical site on back. NSGY expressed fluid from surgical site until resolution. 8/17 UOP improving  Assessment & Plan:   Active Problems:   Lumbar spine scoliosis   Spinal stenosis of lumbar region   Arterial line in place   Encounter for central line placement   Cerebral thrombosis with cerebral infarction   Altered mental status   Respiratory failure ([place])   Tracheostomy in place [facility])   Acute respiratory failure with hypoxia and hypercapnia Postoperative respiratory failure Patient was initially managed intubated on 8/4, managed on via ventilator and extubated on 8/15. Unfortunately, patient developed decline in setting of stridor, requiring same-day reintubation and subsequent tracheostomy on 8/16. Currently weaned off the ventilator. Trach decannulation on 8/20 secondary to malpositioned tracheostomy -Pulmonology for tracheostomy care  Fever Uncertain source at this time but patient recently on ventilator. No symptoms. Associated slightly worsened leukocytosis. -Blood cultures, urinalysis, chest x-ray  Hypernatremia Slightly worsened this morning. Free water started on 8/20 -Continue free water -Daily BMP  Acute metabolic encephalopathy Resolved.  AKI with oliguria Likely secondary to rhabdomyolysis/ATN. Nephrology consulted and managed with CRRT/hemodialysis. Currently improving. UOP of 2.225 L over the last 24 hours. Management per nephrology  Spinal stenosis s/p thoracolumbar fusion/decompression -Management per primary.   Acute RLE/left IJ DVT Patient started on heparin IV. Can transition to Eliquis prior to discharge  Acute bilateral cortical MCA/ACA watershed cortical/subcortical left thalamic infarcts Neurology consulted. Concern for for perioperative hypotension in setting  of intracranial stenosis but could not rule out cardioembolic source. Carotid dopplers with 1-39% stenosis, MRA head/neck negative, TCD with bubble was negative for  PFO, Transthoracic Echocardiogram significant for no thrombus and normal left atrial size. LDL of 30 and hemoglobin A1C of 5.8%. final recommendations for Eliquis once ready to change to PO anticoagulation.  Acute on chronic anemia Perioperative blood loss Patient has required 5 units of PRBC. Stable.  Seizure disorder Continue Keppra  Poor nutrition In setting of critically ill status, recent intubation. Patient is currently with a CorTrak feeding tube and on tube feeds. Speech therapy consulted for swallow evaluation. Dietitian is consulted for nutrition management. -Continue tube feeds at 55 mL/hr -Follow-up SLP recommendations for possible PO intake  Chest hematoma Stable on heparin IV  Pressure injury Left heel, unsure if present on admission.   DVT prophylaxis: Per primary Code Status:   Code Status: Full Code Family Communication: None at bedside Disposition Plan: Per primary. Appears patient is being set up for possible CIR discharge   Consultants:  PCCM Nephrology Neurology Urology General medicine  Procedures:  ANTERIOR LATERAL INTERBODY FUSION L1-L5 (12/12/2020) CYSTOSCOPY/URETHRAL DILATION/FOLEY INSERTION (12/12/2020) THORACIC - ILIAC FUSION; L5-S1 TRANSFORAMINAL LUMBAR INTERBODY FUSION (12/13/2020) INTUBATION/MECHANICAL VENTILATION (8/4-8/15) IVC FILTER PLACEMENT (12/16/2020) EEG (12/16/2020)  Impression and clinical correlation: This EEG was obtained while unresponsive and is abnormal due to moderate diffuse slowing indicative of global cerebral dysfunction.  LTM EEG (8/7-8/8)  IMPRESSION: This technically difficult study was initially suggestive of moderate diffuse encephalopathy, nonspecific etiology. After around 1830 on 12/16/2020, eeg was suggestive of profound diffuse encephalopathy, likely due to  sedation. No seizures or epileptiform discharges were seen throughout the recording.  LTM EEG (8/8-8/9).   IMPRESSION: This study is suggestive of moderate to severe diffuse encephalopathy, nonspecific etiology. No seizures or epileptiform discharges were seen throughout the recording.  TUNNELED HD LINE PLACEMENT (12/21/2020) [place] (12/21/2020) INTUBATION/MECHANICAL VENTILATION (8/15-8/16/2022) TRACHEOSTOMY (12/25/2020)   Antimicrobials: Vancomycin IV Cefazolin IV    Subjective: No concerns today  Objective: BP (!) 166/80 (BP Location: Left Arm)   Pulse 99   Temp 99.5 F (37.5 C) (Oral)   Resp (!) 31   Ht 6' (1.829 m)   Wt 108 kg   SpO2 96%   BMI 32.29 kg/m   Examination:  General exam: Appears calm and comfortable and in no acute distress. Conversant but does not speak much. Respiratory: Clear to auscultation. Respiratory effort normal with no intercostal retractions or use of accessory muscles Cardiovascular: S1 & S2 heard, RRR. No murmurs, rubs, gallops or clicks.  Gastrointestinal: Abdomen is Nondistended, soft and nontender. No masses felt. normal bowel sounds heard Neurologic: No focal neurological deficits Musculoskeletal: No calf tenderness Skin: No cyanosis. No new rashes Psychiatry: Alert and oriented. Memory intact. Mood & affect appropriate   Data Reviewed: I have personally reviewed following labs and imaging studies  CBC Lab Results  Component Value Date   WBC 15.1 (H) 12/30/2020   RBC 2.87 (L) 12/30/2020   HGB 7.9 (L) 12/30/2020   HCT 25.3 (L) 12/30/2020   MCV 88.2 12/30/2020   MCH 27.5 12/30/2020   PLT 453 (H) 12/30/2020   MCHC 31.2 12/30/2020   RDW 18.6 (H) 12/30/2020   LYMPHSABS 0.7 12/19/2020   MONOABS 0.7 12/19/2020   EOSABS 0.2 12/19/2020   BASOSABS 0.0 99991111    Last metabolic panel Lab Results  Component Value Date   NA 150 (H) 12/30/2020   K 4.2 12/30/2020   CL 116 (H) 12/30/2020   CO2 24 12/30/2020    BUN 87 (H) 12/30/2020   CREATININE 3.25 (H) 12/30/2020   GLUCOSE 110 (H) 12/30/2020  GFRNONAA 21 (L) 12/30/2020   GFRAA >60 04/25/2019   CALCIUM 8.6 (L) 12/30/2020   PHOS 4.2 12/30/2020   PROT 5.0 (L) 12/19/2020   ALBUMIN 2.2 (L) 12/30/2020   BILITOT 0.7 12/19/2020   ALKPHOS 294 (H) 12/19/2020   AST 79 (H) 12/19/2020   ALT 9 12/19/2020   ANIONGAP 10 12/30/2020    CBG (last 3)  Recent Labs    12/29/20 1126 12/29/20 1551 12/30/20 0752  GLUCAP 118* 117* 112*      GFR: Estimated Creatinine Clearance: 31.5 mL/min (A) (by C-G formula based on SCr of 3.25 mg/dL (H)).  Coagulation Profile: Recent Labs  Lab 12/25/20 0434  INR 1.2     No results found for this or any previous visit (from the past 240 hour(s)).      Radiology Studies: No results found.    Scheduled Meds:  alteplase  2 mg Intracatheter Once   amLODipine  2.5 mg Per Tube Daily   aspirin  81 mg Per Tube Daily   bacitracin   Topical Daily   chlorhexidine  15 mL Mouth Rinse BID   Chlorhexidine Gluconate Cloth  6 each Topical Daily   cholecalciferol  2,000 Units Per Tube BID   ciprofloxacin  1 drop Left Eye Q4H while awake   docusate  100 mg Per Tube BID   feeding supplement (PROSource TF)  45 mL Per Tube TID   folic acid  1 mg Per Tube Daily   free water  200 mL Per Tube Q4H   gabapentin  300 mg Per Tube Daily   lamoTRIgine  100 mg Per Tube BID   levETIRAcetam  500 mg Per Tube BID   mouth rinse  15 mL Mouth Rinse q12n4p   pantoprazole sodium  40 mg Per Tube QHS   polyethylene glycol  17 g Per Tube Daily   QUEtiapine  200 mg Per Tube QHS   sertraline  100 mg Per Tube QPC breakfast   sodium chloride flush  10-40 mL Intracatheter Q12H   sodium chloride flush  3 mL Intravenous Q12H   thiamine injection  100 mg Intravenous Daily   vitamin B-12  1,000 mcg Per Tube Daily   zinc sulfate  220 mg Per Tube Daily   Continuous Infusions:  sodium chloride Stopped (12/25/20 1749)   feeding supplement  (NEPRO CARB STEADY) 1,000 mL (12/30/20 0511)   heparin 2,700 Units/hr (12/30/20 0255)     LOS: 18 days     Cordelia Poche, MD [facility] 12/30/2020, 8:00 AM  If 7PM-7AM, please contact night-coverage [email or web address]

## 2020-12-30 NOTE — Progress Notes (Addendum)
Cable for IV Heparin Indication: DVT  No Known Allergies  Patient Measurements: Height: 6' (182.9 cm) Weight: 108 kg (238 lb 1.6 oz) IBW/kg (Calculated) : 77.6 Heparin Dosing Weight: 99 kg   Vital Signs: Temp: 99.5 F (37.5 C) (08/21 0707) Temp Source: Oral (08/21 0707) BP: 166/80 (08/21 0707) Pulse Rate: 99 (08/21 0707)  Labs: Recent Labs    12/28/20 0518 12/28/20 1612 12/29/20 0440 12/30/20 0334  HGB 8.7*  --  8.0* 7.9*  HCT 26.7*  --  24.8* 25.3*  PLT 378  --  430* 453*  APTT 52*  --  62* 52*  CREATININE 4.17* 3.82* 3.62* 3.25*    Estimated Creatinine Clearance: 31.5 mL/min (A) (by C-G formula based on SCr of 3.25 mg/dL (H)).  Assessment: 60 yr old male with RLE DVT, S/P recent lumbar fusion.  Patient continues on IV heparin and was transitioned to [facility] per nephrology on 8/14.   aPTT remains therapeutic on 2700 units/hr  Goal of Therapy:  aPTT 50-75 seconds per neurosurgery Monitor platelets by anticoagulation protocol: Yes   Plan:  Continue heparin gtt at 2700 units/hr Daily aPTT, CBC, s/s bleeding F/u ability to transition to Eliquis if no PEG or further procedures prior to discharge  Bertis Ruddy, PharmD Clinical Pharmacist Please check AMION for all [place] numbers 12/30/2020 9:19 AM

## 2020-12-30 NOTE — Progress Notes (Signed)
Subjective: Patient reports no back pain right now, resting comfortably in bed.   Objective: Vital signs in last 24 hours: Temp:  [98.7 F (37.1 C)-101.2 F (38.4 C)] 99.5 F (37.5 C) (08/21 0707) Pulse Rate:  [94-108] 99 (08/21 0707) Resp:  [17-31] 31 (08/21 0707) BP: (145-175)/(66-95) 166/80 (08/21 0707) SpO2:  [92 %-100 %] 96 % (08/21 0707) FiO2 (%):  [21 %] 21 % (08/20 0832)  Intake/Output from previous day: 08/20 0701 - 08/21 0700 In: 1527.3 [I.V.:697.3; NG/GT:830] Out: 1300 [Urine:1300] Intake/Output this shift: No intake/output data recorded.  Neurologic: unchanged  Lab Results: Lab Results  Component Value Date   WBC 15.1 (H) 12/30/2020   HGB 7.9 (L) 12/30/2020   HCT 25.3 (L) 12/30/2020   MCV 88.2 12/30/2020   PLT 453 (H) 12/30/2020   Lab Results  Component Value Date   INR 1.2 12/25/2020   BMET Lab Results  Component Value Date   NA 150 (H) 12/30/2020   K 4.2 12/30/2020   CL 116 (H) 12/30/2020   CO2 24 12/30/2020   GLUCOSE 110 (H) 12/30/2020   BUN 87 (H) 12/30/2020   CREATININE 3.25 (H) 12/30/2020   CALCIUM 8.6 (L) 12/30/2020    Studies/Results: No results found.  Assessment/Plan: 60 year old s/p thoracolumbar fusion with  postop kidney and respiratory complications. Continue therapies today. Awaiting CIR placement.    LOS: 18 days    Keith Dixon 12/30/2020, 8:30 AM

## 2020-12-31 ENCOUNTER — Inpatient Hospital Stay (HOSPITAL_COMMUNITY): Payer: No Typology Code available for payment source

## 2020-12-31 HISTORY — PX: IR REMOVAL TUN CV CATH W/O FL: IMG2289

## 2020-12-31 LAB — GLUCOSE, CAPILLARY
Glucose-Capillary: 127 mg/dL — ABNORMAL HIGH (ref 70–99)
Glucose-Capillary: 106 mg/dL — ABNORMAL HIGH (ref 70–99)
Glucose-Capillary: 118 mg/dL — ABNORMAL HIGH (ref 70–99)
Glucose-Capillary: 120 mg/dL — ABNORMAL HIGH (ref 70–99)
Glucose-Capillary: 129 mg/dL — ABNORMAL HIGH (ref 70–99)

## 2020-12-31 LAB — RENAL FUNCTION PANEL
Albumin: 2.1 g/dL — ABNORMAL LOW (ref 3.5–5.0)
Anion gap: 7 (ref 5–15)
BUN: 75 mg/dL — ABNORMAL HIGH (ref 6–20)
CO2: 25 mmol/L (ref 22–32)
Calcium: 8.5 mg/dL — ABNORMAL LOW (ref 8.9–10.3)
Chloride: 119 mmol/L — ABNORMAL HIGH (ref 98–111)
Creatinine, Ser: 2.68 mg/dL — ABNORMAL HIGH (ref 0.61–1.24)
GFR, Estimated: 27 mL/min — ABNORMAL LOW (ref >60)
Glucose, Bld: 128 mg/dL — ABNORMAL HIGH (ref 70–99)
Phosphorus: 3.6 mg/dL (ref 2.5–4.6)
Potassium: 4 mmol/L (ref 3.5–5.1)
Sodium: 151 mmol/L — ABNORMAL HIGH (ref 135–145)

## 2020-12-31 LAB — MAGNESIUM: Magnesium: 1.8 mg/dL (ref 1.7–2.4)

## 2020-12-31 LAB — APTT
aPTT: 106 seconds — ABNORMAL HIGH (ref 24–36)
aPTT: 112 seconds — ABNORMAL HIGH (ref 24–36)
aPTT: 68 seconds — ABNORMAL HIGH (ref 24–36)

## 2020-12-31 LAB — HEPARIN LEVEL (UNFRACTIONATED): Heparin Unfractionated: 0.46 IU/mL (ref 0.30–0.70)

## 2020-12-31 MED ORDER — FREE WATER
300.0000 mL | Status: DC
  Administered 2020-12-31 – 2021-01-01 (×12): 300 mL

## 2020-12-31 MED ORDER — LIDOCAINE HCL 1 % IJ SOLN
INTRAMUSCULAR | Status: AC
  Filled 2020-12-31: qty 20

## 2020-12-31 MED ORDER — HEPARIN (PORCINE) 25000 UT/250ML-% IV SOLN
2300.0000 [IU]/h | INTRAVENOUS | Status: AC
  Administered 2020-12-31 – 2021-01-01 (×3): 2400 [IU]/h via INTRAVENOUS
  Administered 2021-01-02: 2300 [IU]/h via INTRAVENOUS
  Administered 2021-01-02: 2400 [IU]/h via INTRAVENOUS
  Administered 2021-01-03 – 2021-01-04 (×3): 2300 [IU]/h via INTRAVENOUS
  Filled 2020-12-31 (×9): qty 250
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
## 2020-12-31 NOTE — Progress Notes (Signed)
[facility] NEPHROLOGY PROGRESS NOTE  Assessment/ Plan:  # Resolving AKI, prev HD dep't.   Etiology from a combination of ATN and rhabdomyolysis/pigment nephropathy.   Last dialysis was on 8/17.   Great UOP and downtrending SCr, no furthe HD indicated Needs TDC removed, will order with IR Daily weights, Daily Renal Panel, Strict I/Os, Avoid nephrotoxins (NSAIDs, judicious IV Contrast)   #Mild hypernatremia because of free water excretion with renal recovery.  Increase free water from the feeding tubeto q2h  CTM  #  Acute postoperative hypoxic/hypercapnic respiratory failure: The tracheostomy is capped now and off of ventilator.  Improving  #  Lumbar spine stenosis/scoliosis status post decompression and fusion surgery: Ongoing management/surveillance by neurosurgery.  # Shock: Secondary to distributive/hypovolemic mechanism; this is resolved and he is currently hypertensive. On amlodipine.   # Acute blood loss anemia: Secondary to surgical associated losses/critical illness, transfusion per primary team.  # Acute multifocal bilateral CVA: Suggestive of embolic disease, supportive management.   Subjective: Seen and examined in ICU.  Off of ventilator and tracheostomy is capped.  Alert awake.  Urine output is around 1.3 L documented.  He had temperature of 101 last night, currently afebrile.  Objective Vital signs in last 24 hours: Vitals:   12/30/20 2327 12/31/20 0403 12/31/20 0900 12/31/20 1200  BP: 129/70 127/78 127/77 (!) 145/84  Pulse: (!) 101 98    Resp: (!) 27 (!) 25    Temp: 99.1 F (37.3 C) 98.4 F (36.9 C) 98.8 F (37.1 C) 98.5 F (36.9 C)  TempSrc: Oral Oral  Oral  SpO2: 94% 95%    Weight:      Height:       Weight change:   Intake/Output Summary (Last 24 hours) at 12/31/2020 1253 Last data filed at 12/31/2020 0547 Gross per 24 hour  Intake 1426.61 ml  Output 3775 ml  Net -2348.39 ml        Labs: Basic Metabolic Panel: Recent Labs   Lab 12/29/20 0440 12/30/20 0334 12/31/20 0348  NA 149* 150* 151*  K 3.7 4.2 4.0  CL 113* 116* 119*  CO2 '26 24 25  '$ GLUCOSE 131* 110* 128*  BUN 91* 87* 75*  CREATININE 3.62* 3.25* 2.68*  CALCIUM 9.0 8.6* 8.5*  PHOS 4.8* 4.2 3.6    Liver Function Tests: Recent Labs  Lab 12/29/20 0440 12/30/20 0334 12/31/20 0348  ALBUMIN 2.2* 2.2* 2.1*    No results for input(s): LIPASE, AMYLASE in the last 168 hours. No results for input(s): AMMONIA in the last 168 hours. CBC: Recent Labs  Lab 12/26/20 0639 12/27/20 0234 12/28/20 0518 12/29/20 0440 12/30/20 0334  WBC 13.4* 14.0* 16.5* 13.6* 15.1*  HGB 8.2* 8.1* 8.7* 8.0* 7.9*  HCT 26.0* 25.4* 26.7* 24.8* 25.3*  MCV 86.1 84.9 85.0 85.5 88.2  PLT 311 311 378 430* 453*    Cardiac Enzymes: No results for input(s): CKTOTAL, CKMB, CKMBINDEX, TROPONINI in the last 168 hours. CBG: Recent Labs  Lab 12/30/20 2009 12/30/20 2344 12/31/20 0405 12/31/20 0813 12/31/20 1242  GLUCAP 113* 115* 127* 106* 118*     Iron Studies: No results for input(s): IRON, TIBC, TRANSFERRIN, FERRITIN in the last 72 hours. Studies/Results: DG CHEST PORT 1 VIEW  Result Date: 12/30/2020 CLINICAL DATA:  Fever. History of tracheostomy. Ventilator dependence. EXAM: PORTABLE CHEST 1 VIEW COMPARISON:  12/27/2020 FINDINGS: RIGHT-sided IJ central line tip overlies the LOWER superior vena cava/RIGHT atrium. Tracheostomy tube has been removed. Feeding tube is in place, tip not well seen.  Stable elevation of the RIGHT hemidiaphragm. There has been improvement in aeration of the lungs bilaterally. Subsegmental atelectasis identified at both lung bases. IMPRESSION: 1. Interval removal of tracheostomy tube. 2. Improved aeration in the lungs. Electronically Signed   By: Nolon Nations M.D.   On: 12/30/2020 12:17    Medications: Infusions:  sodium chloride Stopped (12/25/20 1749)   feeding supplement (NEPRO CARB STEADY) 1,000 mL (12/31/20 0250)   heparin 2,400 Units/hr  (12/31/20 0717)    Scheduled Medications:  alteplase  2 mg Intracatheter Once   amLODipine  2.5 mg Per Tube Daily   aspirin  81 mg Per Tube Daily   bacitracin   Topical Daily   chlorhexidine  15 mL Mouth Rinse BID   Chlorhexidine Gluconate Cloth  6 each Topical Daily   cholecalciferol  2,000 Units Per Tube BID   ciprofloxacin  1 drop Left Eye Q4H while awake   docusate  100 mg Per Tube BID   feeding supplement (PROSource TF)  45 mL Per Tube TID   folic acid  1 mg Per Tube Daily   free water  300 mL Per Tube Q4H   gabapentin  300 mg Per Tube Daily   lamoTRIgine  100 mg Per Tube BID   levETIRAcetam  500 mg Per Tube BID   mouth rinse  15 mL Mouth Rinse q12n4p   pantoprazole sodium  40 mg Per Tube QHS   polyethylene glycol  17 g Per Tube Daily   QUEtiapine  200 mg Per Tube QHS   sertraline  100 mg Per Tube QPC breakfast   sodium chloride flush  10-40 mL Intracatheter Q12H   sodium chloride flush  3 mL Intravenous Q12H   thiamine injection  100 mg Intravenous Daily   vitamin B-12  1,000 mcg Per Tube Daily   zinc sulfate  220 mg Per Tube Daily    have reviewed scheduled and prn medications.  Physical Exam: General: Comfortable, tracheostomy capped.Marland Kitchen Heart:RRR, s1s2 nl Lungs: Coarse breath sound anteriorly. Abdomen:soft, Non-tender, non-distended Extremities:No edema Dialysis Access: Right IJ HD catheter.  Tomy Khim B Anjolina Byrer 12/31/2020,12:53 PM  LOS: 19 days

## 2020-12-31 NOTE — Progress Notes (Signed)
[place] for IV Heparin Indication: DVT  No Known Allergies  Patient Measurements: Height: 6' (182.9 cm) Weight: 108 kg (238 lb 1.6 oz) IBW/kg (Calculated) : 77.6 Heparin Dosing Weight: 99 kg   Vital Signs: Temp: 98.4 F (36.9 C) (08/22 0403) Temp Source: Oral (08/22 0403) BP: 127/78 (08/22 0403) Pulse Rate: 98 (08/22 0403)  Labs: Recent Labs    12/29/20 0440 12/30/20 0334 12/31/20 0348 12/31/20 0522  HGB 8.0* 7.9*  --   --   HCT 24.8* 25.3*  --   --   PLT 430* 453*  --   --   APTT 62* 52* 106* 112*  CREATININE 3.62* 3.25* 2.68*  --     Estimated Creatinine Clearance: 38.2 mL/min (A) (by C-G formula based on SCr of 2.68 mg/dL (H)).  Assessment: 60 yr old male with RLE DVT, S/P recent lumbar fusion.  Patient continues on IV heparin and was transitioned to [facility] per nephrology on 8/14.   aPTT up to 106 sec this morning, questioned accuracy since pt has been so stable on current dose with aPTTs 50-60s. Repeat aPTT remains supratherapeutic (112 sec) on gtt at 2700 units/hr. No bleeding noted.   Goal of Therapy:  aPTT 50-75 seconds per neurosurgery Monitor platelets by anticoagulation protocol: Yes   Plan:  Hold heparin x 1 hour Restart heparin gtt at 2400 units/hr F/u 6 hr PTT/heparin level post heparin restart Discuss with MD on rounds if we can use heparin levels for monitoring F/u ability to transition to Eliquis if no PEG or further procedures prior to discharge  Sherlon Handing, PharmD, BCPS Please see amion for complete clinical pharmacist phone list 12/31/2020 6:07 AM

## 2020-12-31 NOTE — Social Work (Signed)
                                                                                                                          Re: Keith Dixon Date of Birth: June 21, 1960 Date: 12/31/2020  To Whom It May Concern:  Please be advised that the above-named patient will require a short-term nursing home stay-anticipated 30 days or less for rehabilitation and strengthening. The plan is to return home.
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
## 2020-12-31 NOTE — Progress Notes (Signed)
PROGRESS NOTE    [name]  [id number] DOB: [birth year] DOA: 12/12/2020 PCP: Reubin Milan, MD  Primary service: Neurosurgery  Brief Narrative: Keith Dixon is a 60 y.o. male with history of hypertension, obesity, PTSD, seizures, bipolar disorder.  Patient presented secondary to worsening leg pain and neurogenic claudication symptoms for thoracolumbar decompression and fusion.  Postop, patient developed persistent respiratory failure requiring admission to the ICU and continuation of ventilator support.  During admission, patient developed oliguric AKI requiring CRRT, multiple bilateral strokes with neurology consultation and management.  Patient required tracheostomy in order to come off the ventilator.    ICU Significant Events: 8/3 Admit to neurosurgery, underwent  anterior lateral Interbody fusion of L1-L5, notably had difficult foley insertion requiring urethral dilation 8/4 Stage 2 posterior decompression and fusion, left intubated post-procedure and PCCM consulted 8/5 AKI seen on am labs with evidence of volume depletion on POC [place] 8/6: Patient AKI was due to acute rhabdomyolysis in the setting of prolonged surgery, he remained oliguric.  Ultrasound lower extremity showed acute right lower extremity DVT and left IJ DVT, IVC filter placed 8/7: Patient serum creatinine continues to trend up, he remained oliguric, started on CRRT Head CT showed small multiple bilateral strokes.  EEG was placed  8/9>. No seizures per EEG, remains on Levo, Propofol and fentanyl with good vent synchrony. Remains on CVVH with UF goal of 50 cc per hour, but increasing pressor needs this am  8/11: off pressors, mental status precluding extubation 8/12: Tunneled HD line placement 8/13: Tolerating some PSV on fentanyl 8/13 MRI/A brain > multiple acute/subacute CVA in the cerebellum, left > right thalami, bilateral cerebral hemispheres.  No abnormal findings in the neck, normal angiography 8/14  Began HD 8/15 Extubated and declined to due to stridor and required reintubation.  8/16 S/p tracheostomy. Hgb 6.7 transfused 1u pRBC likely from actively draining serosanguinous fluid from surgical site on back. NSGY expressed fluid from surgical site until resolution. 8/17 UOP improving  Assessment & Plan:   Active Problems:   Lumbar spine scoliosis   Spinal stenosis of lumbar region   Arterial line in place   Encounter for central line placement   Cerebral thrombosis with cerebral infarction   Altered mental status   Respiratory failure ([place])   Tracheostomy in place [facility])   History of tracheostomy   Acute respiratory failure with hypoxia and hypercapnia Postoperative respiratory failure Patient was initially managed intubated on 8/4, managed on via ventilator and extubated on 8/15. Unfortunately, patient developed decline in setting of stridor, requiring same-day reintubation and subsequent tracheostomy on 8/16. Currently weaned off the ventilator. Trach decannulation on 8/20 secondary to malpositioned tracheostomy.  Fever Uncertain source at this time but patient recently on ventilator. No symptoms. Associated slightly worsened leukocytosis. -Blood cultures, urinalysis, chest x-ray  Hypernatremia Still with slight worsening this morning. Free water started on 8/20 -Continue free water per nephrology -Daily BMP  Acute metabolic encephalopathy Resolved.  AKI with oliguria Likely secondary to rhabdomyolysis/ATN. Nephrology consulted and managed with CRRT/hemodialysis. Currently improving. UOP of 2.225 L over the last 24 hours. Management per nephrology  Spinal stenosis s/p thoracolumbar fusion/decompression -Management per primary.   Acute RLE/left IJ DVT Patient started on heparin IV. Can transition to Eliquis prior to discharge  Acute bilateral cortical MCA/ACA watershed cortical/subcortical left thalamic infarcts Neurology consulted. Concern for for perioperative  hypotension in setting of intracranial stenosis but could not rule out cardioembolic source. Carotid dopplers with 1-39% stenosis, MRA head/neck negative, TCD  with bubble was negative for PFO, Transthoracic Echocardiogram significant for no thrombus and normal left atrial size. LDL of 30 and hemoglobin A1C of 5.8%. final recommendations for Eliquis once ready to change to PO anticoagulation.  Acute on chronic anemia Perioperative blood loss Patient has required 5 units of PRBC. Stable.  Seizure disorder Continue Keppra  Poor nutrition In setting of critically ill status, recent intubation. Patient is currently with a CorTrak feeding tube and on tube feeds. Speech therapy consulted for swallow evaluation. Dietitian is consulted for nutrition management. -Continue tube feeds at 55 mL/hr -Follow-up SLP recommendations for possible PO intake  Chest hematoma Stable on heparin IV  Pressure injury Left heel, unsure if present on admission.   DVT prophylaxis: Per primary Code Status:   Code Status: Full Code Family Communication: Daughter at bedside Disposition Plan: Per primary. Appears patient is being set up for possible CIR discharge   Consultants:  PCCM Nephrology Neurology Urology General medicine  Procedures:  ANTERIOR LATERAL INTERBODY FUSION L1-L5 (12/12/2020) CYSTOSCOPY/URETHRAL DILATION/FOLEY INSERTION (12/12/2020) THORACIC - ILIAC FUSION; L5-S1 TRANSFORAMINAL LUMBAR INTERBODY FUSION (12/13/2020) INTUBATION/MECHANICAL VENTILATION (8/4-8/15) IVC FILTER PLACEMENT (12/16/2020) EEG (12/16/2020)  Impression and clinical correlation: This EEG was obtained while unresponsive and is abnormal due to moderate diffuse slowing indicative of global cerebral dysfunction.  LTM EEG (8/7-8/8)  IMPRESSION: This technically difficult study was initially suggestive of moderate diffuse encephalopathy, nonspecific etiology. After around 1830 on 12/16/2020, eeg was suggestive of profound diffuse  encephalopathy, likely due to sedation. No seizures or epileptiform discharges were seen throughout the recording.  LTM EEG (8/8-8/9).   IMPRESSION: This study is suggestive of moderate to severe diffuse encephalopathy, nonspecific etiology. No seizures or epileptiform discharges were seen throughout the recording.  TUNNELED HD LINE PLACEMENT (12/21/2020) [place] (12/21/2020) INTUBATION/MECHANICAL VENTILATION (8/15-8/16/2022) TRACHEOSTOMY (12/25/2020)   Antimicrobials: Vancomycin IV Cefazolin IV    Subjective: Patient is angry today. Seems to be angry that he did not get a speech therapy evaluation yesterday.  Objective: BP (!) 145/84 (BP Location: Right Arm)   Pulse 98   Temp 98.5 F (36.9 C) (Oral)   Resp (!) 25   Ht 6' (1.829 m)   Wt 108 kg   SpO2 95%   BMI 32.29 kg/m   Examination:  General exam: Appears agitated but otherwise comfortable Respiratory system: Clear to auscultation. Respiratory effort normal. Cardiovascular system: S1 & S2 heard, RRR. No murmurs, rubs, gallops or clicks. Gastrointestinal system: Abdomen is nondistended, soft and nontender. No organomegaly or masses felt. Normal bowel sounds heard. Central nervous system: Alert and oriented. No focal neurological deficits. Musculoskeletal: No edema. No calf tenderness Skin: No cyanosis. No rashes Psychiatry: Judgement and insight appear normal. Agitated mood    Data Reviewed: I have personally reviewed following labs and imaging studies  CBC Lab Results  Component Value Date   WBC 15.1 (H) 12/30/2020   RBC 2.87 (L) 12/30/2020   HGB 7.9 (L) 12/30/2020   HCT 25.3 (L) 12/30/2020   MCV 88.2 12/30/2020   MCH 27.5 12/30/2020   PLT 453 (H) 12/30/2020   MCHC 31.2 12/30/2020   RDW 18.6 (H) 12/30/2020   LYMPHSABS 0.7 12/19/2020   MONOABS 0.7 12/19/2020   EOSABS 0.2 12/19/2020   BASOSABS 0.0 99991111    Last metabolic panel Lab Results  Component Value Date   NA 151 (H)  12/31/2020   K 4.0 12/31/2020   CL 119 (H) 12/31/2020   CO2 25 12/31/2020   BUN 75 (H) 12/31/2020  CREATININE 2.68 (H) 12/31/2020   GLUCOSE 128 (H) 12/31/2020   GFRNONAA 27 (L) 12/31/2020   GFRAA >60 04/25/2019   CALCIUM 8.5 (L) 12/31/2020   PHOS 3.6 12/31/2020   PROT 5.0 (L) 12/19/2020   ALBUMIN 2.1 (L) 12/31/2020   BILITOT 0.7 12/19/2020   ALKPHOS 294 (H) 12/19/2020   AST 79 (H) 12/19/2020   ALT 9 12/19/2020   ANIONGAP 7 12/31/2020    CBG (last 3)  Recent Labs    12/31/20 0405 12/31/20 0813 12/31/20 1242  GLUCAP 127* 106* 118*      GFR: Estimated Creatinine Clearance: 38.2 mL/min (A) (by C-G formula based on SCr of 2.68 mg/dL (H)).  Coagulation Profile: Recent Labs  Lab 12/25/20 0434  INR 1.2     Recent Results (from the past 240 hour(s))  Culture, blood (routine x 2)     Status: None (Preliminary result)   Collection Time: 12/30/20  9:24 AM   Specimen: BLOOD  Result Value Ref Range Status   Specimen Description BLOOD BLOOD LEFT HAND  Final   Special Requests AEROBIC BOTTLE ONLY Blood Culture adequate volume  Final   Culture   Final    NO GROWTH < 24 HOURS Performed at [facility], [address] [place], [place] [address]    Report Status PENDING  Incomplete  Culture, blood (routine x 2)     Status: None (Preliminary result)   Collection Time: 12/30/20  9:27 AM   Specimen: BLOOD  Result Value Ref Range Status   Specimen Description BLOOD BLOOD RIGHT HAND  Final   Special Requests AEROBIC BOTTLE ONLY Blood Culture adequate volume  Final   Culture   Final    NO GROWTH < 24 HOURS Performed at [facility], [place] [address] [place],  [address]    Report Status PENDING  Incomplete        Radiology Studies: DG CHEST PORT 1 VIEW  Result Date: 12/30/2020 CLINICAL DATA:  Fever. History of tracheostomy. Ventilator dependence. EXAM: PORTABLE CHEST 1 VIEW COMPARISON:  12/27/2020 FINDINGS: RIGHT-sided IJ central line tip overlies the  LOWER superior vena cava/RIGHT atrium. Tracheostomy tube has been removed. Feeding tube is in place, tip not well seen. Stable elevation of the RIGHT hemidiaphragm. There has been improvement in aeration of the lungs bilaterally. Subsegmental atelectasis identified at both lung bases. IMPRESSION: 1. Interval removal of tracheostomy tube. 2. Improved aeration in the lungs. Electronically Signed   By: Nolon Nations M.D.   On: 12/30/2020 12:17      Scheduled Meds:  alteplase  2 mg Intracatheter Once   amLODipine  2.5 mg Per Tube Daily   aspirin  81 mg Per Tube Daily   bacitracin   Topical Daily   chlorhexidine  15 mL Mouth Rinse BID   Chlorhexidine Gluconate Cloth  6 each Topical Daily   cholecalciferol  2,000 Units Per Tube BID   ciprofloxacin  1 drop Left Eye Q4H while awake   docusate  100 mg Per Tube BID   feeding supplement (PROSource TF)  45 mL Per Tube TID   folic acid  1 mg Per Tube Daily   free water  300 mL Per Tube Q2H   gabapentin  300 mg Per Tube Daily   lamoTRIgine  100 mg Per Tube BID   levETIRAcetam  500 mg Per Tube BID   mouth rinse  15 mL Mouth Rinse q12n4p   pantoprazole sodium  40 mg Per Tube QHS  polyethylene glycol  17 g Per Tube Daily   QUEtiapine  200 mg Per Tube QHS   sertraline  100 mg Per Tube QPC breakfast   sodium chloride flush  10-40 mL Intracatheter Q12H   sodium chloride flush  3 mL Intravenous Q12H   thiamine injection  100 mg Intravenous Daily   vitamin B-12  1,000 mcg Per Tube Daily   zinc sulfate  220 mg Per Tube Daily   Continuous Infusions:  sodium chloride Stopped (12/25/20 1749)   feeding supplement (NEPRO CARB STEADY) 1,000 mL (12/31/20 0250)   heparin 2,400 Units/hr (12/31/20 0717)     LOS: 19 days     Cordelia Poche, MD [facility] 12/31/2020, 2:02 PM  If 7PM-7AM, please contact night-coverage [email or web address]

## 2020-12-31 NOTE — Progress Notes (Addendum)
OT Cancellation Note  Patient Details Name: Keith Dixon MRN: [id number] DOB: 08-08-1960   Cancelled Treatment:    Reason Eval/Treat Not Completed: Patient at procedure or test/ unavailable (off unit for IR/ unit RN contacting [employer] regarding brace needs from 8/19) Unit has contacted [employer] and pending rep to supply brace ordered by Dr Saintclair Halsted on 8/19. Pt has brace in the room that is too small and Dr Saintclair Halsted ordering a TLSO brace on 8/19 instead of current brace present.   Billey Chang, OTR/L  Acute Rehabilitation Services Pager: [phone] Office: [phone] .  12/31/2020, 2:21 PM

## 2020-12-31 NOTE — Evaluation (Signed)
Clinical/Bedside Swallow Evaluation Patient Details  Name: Keith Dixon MRN: [id number] Date of Birth: [birth year]  Today's Date: 12/31/2020 Time: SLP Start Time (ACUTE ONLY): 1053 SLP Stop Time (ACUTE ONLY): 1116 SLP Time Calculation (min) (ACUTE ONLY): 23 min  Past Medical History:  Past Medical History:  Diagnosis Date   Anxiety    Arthritis    Bipolar 1 disorder, depressed ([place])    Bipolar disorder ([place])    Depression    History of posttraumatic stress disorder (PTSD)    Hypertension    PTSD (post-traumatic stress disorder)    Seizures ([place])    Sleep apnea    Smoker    Tobacco use disorder    Past Surgical History:  Past Surgical History:  Procedure Laterality Date   ANTERIOR LATERAL LUMBAR FUSION 4 LEVELS N/A 12/12/2020   Procedure: Anterior Lateral Interbody Fusion Lumbar One-Two, Lumbar Two-Three, Lumbar Three-Four, Lumbar Four-Five;  Surgeon: Franchot Gallo, MD;  Location: [facility];  Service: Urology;  Laterality: N/A;  Anterior Lateral Interbody Fusion Lumbar One-Two, Lumbar Two-Three, Lumbar Three-Four, Lumbar Four-Five   APPLICATION OF INTRAOPERATIVE CT SCAN N/A 12/13/2020   Procedure: APPLICATION OF INTRAOPERATIVE CT SCAN;  Surgeon: Vallarie Mare, MD;  Location: [facility] OR;  Service: Neurosurgery;  Laterality: N/A;   CYSTOSCOPY  12/12/2020   Procedure: CYSTOSCOPY, URETHRAL DILATION, DIFFICULT FOLEY INSERTION;  Surgeon: Franchot Gallo, MD;  Location: [facility];  Service: Urology;;   IR FLUORO GUIDE CV LINE RIGHT  12/21/2020   IR IVC FILTER PLMT / S&I /IMG GUID/MOD SED  12/16/2020   IR US GUIDE VASC ACCESS RIGHT  12/21/2020   POSTERIOR LUMBAR FUSION 4 LEVEL N/A 12/13/2020   Procedure: Thoracic Ten - ILIAC FUSION, L5-S1 TLIF, Posterior osteotomies for deformity correction and decompression L2-3, L3-4, L4-5;  Surgeon: Vallarie Mare, MD;  Location: [facility];  Service: Neurosurgery;  Laterality: N/A;   HPI:  Pt is a 60 y.o. male who presented 12/12/20 with back and R>L leg pain.  S/p DLIFs L1-2 L2-3 L34 L4-5 and T10 pelvis decompression and fusion 8/3 and second surgery 8/4. ETT 8/4 - 8/15, re-intubated 8/15. Found to have R lower extremity DVT and left IJ DVT with IVC filter placed 8/6. AKI. S/p tracheostomy 8/16 and decannulated 8/20. 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 PMH: PTSD, OSA, HTN, tobacco use, bipolar, degenerative scoliosis, seizures   Assessment / Plan / Recommendation Clinical Impression  Oral motor revealed mild decreased facial strength and imprecise lingual movements. Pt decannulated 8/20. Pt expressed desire to leave the hospital and begin eating a regular diet. Pt spoke with reduced vocal intensity, articulatory precision, and clarity. Pt reported no difficulty with thin liquids; reported being able to 'feel' puree in throat after swallow but cleared with thin liquid. Oral dysphagia noted c/b impaired mastication, impaired bolus formation, and oral residue. Residue cleared after two presentations of thin liquid. Throat clearing present after using thin liquid to clear solid oral residue however no overt s/sx of penetration or aspiration were noted. No audible air detected from stoma covered with gauze and may be sufficiently closed. Pt desired to keep graham crackers and apple sauce. ST advised pt to take small bites and use sips of water in between bites to help clear oral residue. SLP Visit Diagnosis: Dysphagia, oral phase (R13.11)    Aspiration Risk  Mild aspiration risk    Diet Recommendation Dysphagia 3 (Mech soft);Thin liquid   Liquid Administration via: Cup;Straw Medication Administration:  Whole meds with liquid Supervision: Staff to assist with self feeding;Full supervision/cueing for compensatory strategies Compensations: Small sips/bites;Multiple dry swallows after each bite/sip;Follow solids with liquid Postural Changes: Seated upright at 90 degrees     Other  Recommendations Oral Care Recommendations: Oral care BID;Staff/trained caregiver to provide oral care   Follow up Recommendations Inpatient Rehab      Frequency and Duration min 1 x/week  2 weeks       Prognosis Prognosis for Safe Diet Advancement: Good      Swallow Study   General HPI: Pt is a 60 y.o. male who presented 12/12/20 with back and R>L leg pain. S/p DLIFs L1-2 L2-3 L34 L4-5 and T10 pelvis decompression and fusion 8/3 and second surgery 8/4. ETT 8/4 - 8/15, re-intubated 8/15. Found to have R lower extremity DVT and left IJ DVT with IVC filter placed 8/6. AKI. S/p tracheostomy 8/16 and decannulated 8/20. 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 PMH: PTSD, OSA, HTN, tobacco use, bipolar, degenerative scoliosis, seizures Type of Study: Bedside Swallow Evaluation Previous Swallow Assessment:  (no) Diet Prior to this Study: NPO;NG Tube Temperature Spikes Noted: No Respiratory Status: Room air History of Recent Intubation: Yes Length of Intubations (days): 12 days Date extubated: 12/25/20 Behavior/Cognition: Agitated;Alert;Cooperative Oral Cavity Assessment: Dried secretions Oral Care Completed by SLP: Yes Oral Cavity - Dentition: Missing dentition;Poor condition Vision: Functional for self-feeding Self-Feeding Abilities: Needs assist Patient Positioning: Upright in bed Baseline Vocal Quality: Hoarse;Low vocal intensity Volitional Cough: Weak Volitional Swallow: Able to elicit    Oral/Motor/Sensory Function Overall Oral Motor/Sensory Function: Mild impairment Facial ROM:  (General ROM reduction/weakness) Facial Symmetry: Within Functional Limits Facial Strength: Reduced right;Reduced left Lingual ROM: Reduced right;Reduced left Lingual Symmetry: Within Functional Limits   Ice Chips Ice chips: Not tested   Thin Liquid Thin Liquid: Within functional limits Presentation: Cup;Straw     Nectar Thick Nectar Thick Liquid: Not tested   Honey Thick Honey Thick Liquid: Not tested   Puree Puree: Within functional limits Presentation: Spoon   Solid     Solid: Impaired Presentation:  (Fed by ST) Oral Phase Impairments: Impaired mastication Oral Phase Functional Implications: Prolonged oral transit;Impaired mastication;Oral residue Pharyngeal Phase Impairments: Throat Clearing - Delayed Other Comments: Required multiple presentations of thin liquid to clear oral residue      Dewitt Rota 12/31/2020,1:29 PM

## 2020-12-31 NOTE — Progress Notes (Signed)
Inpatient Rehabilitation Admissions Coordinator   I met at bedside with patient and his daughter and per daughter's request contacted his significant other, [place], by phone. [place] states she is his POA and that she and daughter are requesting SNF rehab. They are unable to provide 24/7 caregiver supports that is projected he will need. He has [place] through [facility] I will alert acute team and TOC. We will sign off.  Danne Baxter, RN, MSN Rehab Admissions Coordinator [phone] 12/31/2020 11:54 AM

## 2020-12-31 NOTE — Progress Notes (Signed)
[place] for IV Heparin Indication: DVT  No Known Allergies  Patient Measurements: Height: 6' (182.9 cm) Weight: 108 kg (238 lb 1.6 oz) IBW/kg (Calculated) : 77.6 Heparin Dosing Weight: 99 kg   Vital Signs: Temp: 98.5 F (36.9 C) (08/22 1200) Temp Source: Oral (08/22 1200) BP: 145/84 (08/22 1200) Pulse Rate: 98 (08/22 0403)  Labs: Recent Labs    12/29/20 0440 12/30/20 0334 12/31/20 0348 12/31/20 0522 12/31/20 1408  HGB 8.0* 7.9*  --   --   --   HCT 24.8* 25.3*  --   --   --   PLT 430* 453*  --   --   --   APTT 62* 52* 106* 112* 68*  HEPARINUNFRC  --   --   --   --  0.46  CREATININE 3.62* 3.25* 2.68*  --   --     Estimated Creatinine Clearance: 38.2 mL/min (A) (by C-G formula based on SCr of 2.68 mg/dL (H)).  Assessment: 60 yr old male with RLE DVT, S/P recent lumbar fusion.  Patient continues on IV heparin and was transitioned to [facility] per nephrology on 8/14.   Heparin level and aPTT therapeutic s/p decrease to 2400 units/hr  Goal of Therapy:  aPTT 50-75 seconds per neurosurgery Monitor platelets by anticoagulation protocol: Yes   Plan:  Continue heparin gtt at 2400 units/hr Daily aPTT/HL, CBC, s/s bleeding F/u ability to transition to Eliquis in AM if OK from neurosurgery  Bertis Ruddy, PharmD Clinical Pharmacist Please check AMION for all [place] numbers 12/31/2020 2:57 PM

## 2020-12-31 NOTE — Progress Notes (Signed)
Subjective: NAEs o/n.  Afebrile.  Objective: Vital signs in last 24 hours: Temp:  [98.4 F (36.9 C)-101 F (38.3 C)] 98.4 F (36.9 C) (08/22 0403) Pulse Rate:  [95-101] 98 (08/22 0403) Resp:  [24-28] 25 (08/22 0403) BP: (127-138)/(65-83) 127/78 (08/22 0403) SpO2:  [93 %-97 %] 95 % (08/22 0403)  Intake/Output from previous day: 08/21 0701 - 08/22 0700 In: 2066.6 [I.V.:308.6; NG/GT:1758] Out: 3775 [Urine:3775] Intake/Output this shift: No intake/output data recorded.  NAD.  Decannulated, dysphonia but articulating FC x 4, antigravity in [place] and in [place], with generalized deconditioning Prevena in place Flank wound soft, healing well  Lab Results: Recent Labs    12/29/20 0440 12/30/20 0334  WBC 13.6* 15.1*  HGB 8.0* 7.9*  HCT 24.8* 25.3*  PLT 430* 453*   BMET Recent Labs    12/30/20 0334 12/31/20 0348  NA 150* 151*  K 4.2 4.0  CL 116* 119*  CO2 24 25  GLUCOSE 110* 128*  BUN 87* 75*  CREATININE 3.25* 2.68*  CALCIUM 8.6* 8.5*    Studies/Results: DG CHEST PORT 1 VIEW  Result Date: 12/30/2020 CLINICAL DATA:  Fever. History of tracheostomy. Ventilator dependence. EXAM: PORTABLE CHEST 1 VIEW COMPARISON:  12/27/2020 FINDINGS: RIGHT-sided IJ central line tip overlies the LOWER superior vena cava/RIGHT atrium. Tracheostomy tube has been removed. Feeding tube is in place, tip not well seen. Stable elevation of the RIGHT hemidiaphragm. There has been improvement in aeration of the lungs bilaterally. Subsegmental atelectasis identified at both lung bases. IMPRESSION: 1. Interval removal of tracheostomy tube. 2. Improved aeration in the lungs. Electronically Signed   By: Nolon Nations M.D.   On: 12/30/2020 12:17    Assessment/Plan: 60 yo M s/p multi-stage T10-pelvis reconstruction with postop renal failure, resp failure, DVT and small CVAs - hep gtt, titrate per pharmacy - likely d/c prevena tomorrow - speech consult - PT/OT, likely eventual CIR   Vallarie Mare 12/31/2020, 8:58 AM

## 2020-12-31 NOTE — Progress Notes (Signed)
Nutrition Follow-up  DOCUMENTATION CODES:  Not applicable  INTERVENTION:  Continue Nepro @ 55 ml/h (1320 ml per day) via Cortrak 45 ml ProSource TF TID   Provides 2496 kcal, 139 gm protein, 1003 ml free water daily.  Advance diet if possible per SLP.  NUTRITION DIAGNOSIS:  Increased nutrient needs related to wound healing as evidenced by estimated needs. - ongoing  GOAL:  Patient will meet greater than or equal to 90% of their needs - met with TF  MONITOR:  TF tolerance, Diet advancement  REASON FOR ASSESSMENT:  Consult Enteral/tube feeding initiation and management  ASSESSMENT:  Pt with PMH of anxiety, PTSD, bipolar 1 disorder, depression, HTN, Sz, sleep apnea, and a smoker who was admitted for severe scoliosis with multi-level listhesis. 8/3 - s/p L1-2, L2-3, L3-4, L4-5, DLIFs 8/4 - T10-pelvis (10 procedure) 8/7 - started on CRRT 8/12 - CRRT stopped, tunneled HD catheter placed, cortrak placed with tip gastric 8/15 - extubated and re-intubated later that day 8/16 - s/p trach 8/20 - Trach decannulation secondary to malpositioned tracheostomy  Current TF order: Nepro @ 55 ml/h (1320 ml per day) via Cortrak 45 ml ProSource TF TID   Provides 2496 kcal, 139 gm protein, 1003 ml free water daily.  Plan for SLP evaluation to determine swallowing ability.  Admit wt: 104.9 kg Current wt: 108 kg  Medications: reviewed; colace BID, folic acid, FWF 802 ml q4 hrs, Keppra BID, Protonix, thiamine, Vitamin B12, zinc sulfate  Labs: reviewed; Na 151 (H),k CBG 106-127 (H)  Diet Order:   Diet Order             Diet NPO time specified  Diet effective midnight                  EDUCATION NEEDS:  Not appropriate for education at this time  Skin:  Skin Assessment: Skin Integrity Issues: Skin Integrity Issues:: DTI DTI: L heel  Last BM:  12/28/20 - Type 4, medium  Height:  Ht Readings from Last 1 Encounters:  12/24/20 6' (1.829 m)   Weight:  Wt Readings from Last 1  Encounters:  12/28/20 108 kg   BMI:  Body mass index is 32.29 kg/m.  Estimated Nutritional Needs:  Kcal:  2200-2500 Protein:  130-150 grams Fluid:  >2 L/day  Derrel Nip, RD, LDN (she/her/hers) Registered Dietitian I After-Hours/Weekend Pager # in [phone]

## 2020-12-31 NOTE — Progress Notes (Signed)
Orthopedic Tech Progress Note Patient Details:  Keith Dixon 03-May-1961 [id number]  Called in order to [facility] for a TLSO CLAMSHELL   Patient ID: Keith Dixon, male   DOB: 1960-11-12, 60 y.o.   MRN: [id number]  [name] 12/31/2020, 4:14 PM

## 2020-12-31 NOTE — Progress Notes (Signed)
PT Cancellation Note  Patient Details Name: Keith Dixon MRN: [id number] DOB: 16-Sep-1960   Cancelled Treatment:    Reason Eval/Treat Not Completed: Patient at procedure or test/unavailable (IR).  Keith Dixon, PT, DPT Acute Rehabilitation Services Pager [phone] Office [phone]    Keith Dixon 12/31/2020, 2:32 PM

## 2020-12-31 NOTE — Procedures (Signed)
PROCEDURE SUMMARY:  Successful removal of tunneled hemodialysis catheter.  Patient tolerated well.  EBL < 5 mL  See full dictation in Imaging for details.  Armando Gang Vesper Trant PA-C 12/31/2020 2:48 PM

## 2021-01-01 LAB — RENAL FUNCTION PANEL
Albumin: 2.2 g/dL — ABNORMAL LOW (ref 3.5–5.0)
Anion gap: 8 (ref 5–15)
BUN: 62 mg/dL — ABNORMAL HIGH (ref 6–20)
CO2: 25 mmol/L (ref 22–32)
Calcium: 8.7 mg/dL — ABNORMAL LOW (ref 8.9–10.3)
Chloride: 119 mmol/L — ABNORMAL HIGH (ref 98–111)
Creatinine, Ser: 2.32 mg/dL — ABNORMAL HIGH (ref 0.61–1.24)
GFR, Estimated: 32 mL/min — ABNORMAL LOW (ref >60)
Glucose, Bld: 139 mg/dL — ABNORMAL HIGH (ref 70–99)
Phosphorus: 3.9 mg/dL (ref 2.5–4.6)
Potassium: 4 mmol/L (ref 3.5–5.1)
Sodium: 152 mmol/L — ABNORMAL HIGH (ref 135–145)

## 2021-01-01 LAB — GLUCOSE, CAPILLARY
Glucose-Capillary: 106 mg/dL — ABNORMAL HIGH (ref 70–99)
Glucose-Capillary: 107 mg/dL — ABNORMAL HIGH (ref 70–99)
Glucose-Capillary: 108 mg/dL — ABNORMAL HIGH (ref 70–99)
Glucose-Capillary: 112 mg/dL — ABNORMAL HIGH (ref 70–99)
Glucose-Capillary: 112 mg/dL — ABNORMAL HIGH (ref 70–99)
Glucose-Capillary: 123 mg/dL — ABNORMAL HIGH (ref 70–99)
Glucose-Capillary: 98 mg/dL (ref 70–99)

## 2021-01-01 LAB — CBC
HCT: 24.5 % — ABNORMAL LOW (ref 39.0–52.0)
Hemoglobin: 7.5 g/dL — ABNORMAL LOW (ref 13.0–17.0)
MCH: 27.2 pg (ref 26.0–34.0)
MCHC: 30.6 g/dL (ref 30.0–36.0)
MCV: 88.8 fL (ref 80.0–100.0)
Platelets: 353 10*3/uL (ref 150–400)
RBC: 2.76 MIL/uL — ABNORMAL LOW (ref 4.22–5.81)
RDW: 18.6 % — ABNORMAL HIGH (ref 11.5–15.5)
WBC: 12.6 10*3/uL — ABNORMAL HIGH (ref 4.0–10.5)
nRBC: 0 % (ref 0.0–0.2)

## 2021-01-01 LAB — APTT: aPTT: 66 seconds — ABNORMAL HIGH (ref 24–36)

## 2021-01-01 LAB — MAGNESIUM: Magnesium: 1.7 mg/dL (ref 1.7–2.4)

## 2021-01-01 MED ORDER — DOCUSATE SODIUM 50 MG/5ML PO LIQD: 100.0000 mg | Freq: Two times a day (BID) | ORAL | Status: DC | PRN

## 2021-01-01 MED ORDER — FREE WATER
400.0000 mL | Status: DC
  Administered 2021-01-01 – 2021-01-04 (×35): 400 mL

## 2021-01-01 MED ORDER — LEVETIRACETAM 100 MG/ML PO SOLN
750.0000 mg | Freq: Two times a day (BID) | ORAL | Status: DC
  Administered 2021-01-01 – 2021-01-04 (×6): 750 mg
  Filled 2021-01-01 (×7): qty 10

## 2021-01-01 MED ORDER — NEPRO/CARBSTEADY PO LIQD
1000.0000 mL | ORAL | Status: DC
  Administered 2021-01-01 – 2021-01-03 (×3): 1000 mL
  Filled 2021-01-01 (×4): qty 1000

## 2021-01-01 MED ORDER — NEPRO/CARBSTEADY PO LIQD
237.0000 mL | Freq: Two times a day (BID) | ORAL | Status: DC
  Administered 2021-01-02 – 2021-01-07 (×8): 237 mL via ORAL
  Filled 2021-01-01: qty 237

## 2021-01-01 MED ORDER — POLYETHYLENE GLYCOL 3350 17 G PO PACK: 17.0000 g | PACK | Freq: Every day | ORAL | Status: DC | PRN

## 2021-01-01 NOTE — Progress Notes (Signed)
  Speech Language Pathology Treatment: Dysphagia  Patient Details Name: Keith Dixon MRN: [id number] DOB: February 13, 1961 Today's Date: 01/01/2021 Time: [id number] SLP Time Calculation (min) (ACUTE ONLY): 9 min  Assessment / Plan / Recommendation Clinical Impression  [name] seen for skilled swallow treatment with RN tech assisting. With support he can hold cup and utensil to self feed for brief amount of time but needs significant encouragement to try. He exhibited prolonged bolus formation and holding appearing to fatigue versus satiety. Will ask dietitian input for decreasing tube feed volume if appropriate. Required verbal cues to continue masticating/swallow or expectorate. Delayed throat clearing but no overt s/s aspiration and RN tech denied with initial part of breakfast. Will continue Dys 3, thin, and tube feeds, pills whole in puree or crushed if unable and consult with dietitian.   HPI HPI: Pt is a 60 y.o. male who presented 12/12/20 with back and R>L leg pain. S/p DLIFs L1-2 L2-3 L34 L4-5 and T10 pelvis decompression and fusion 8/3 and second surgery 8/4. ETT 8/4 - 8/15, re-intubated 8/15. Found to have R lower extremity DVT and left IJ DVT with IVC filter placed 8/6. AKI. S/p tracheostomy 8/16 and decannulated 8/20. 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 PMH: PTSD, OSA, HTN, tobacco use, bipolar, degenerative scoliosis, seizures      SLP Plan  Continue with current plan of care       Recommendations  Diet recommendations: Dysphagia 3 (mechanical soft);Thin liquid Liquids provided via: Straw;Cup Medication Administration: Whole meds with liquid Supervision: Staff to assist with self feeding;Full supervision/cueing for compensatory strategies Compensations: Small sips/bites;Slow rate;Lingual sweep for clearance of pocketing Postural Changes and/or Swallow Maneuvers: Seated upright 90 degrees                 Oral Care Recommendations: Oral care BID Follow up Recommendations: Skilled Nursing facility;24 hour supervision/assistance SLP Visit Diagnosis: Dysphagia, oral phase (R13.11) Plan: Continue with current plan of care                       Houston Siren 01/01/2021, 10:15 AM  Orbie Pyo [place].Ed Actor Pager [phone] Office [phone]  Orbie Pyo [place].Ed Risk analyst [phone] Office [phone]

## 2021-01-01 NOTE — Progress Notes (Signed)
[facility] NEPHROLOGY PROGRESS NOTE  Assessment/ Plan:  # Resolving AKI, prev HD dep't.   Etiology from a combination of ATN and rhabdomyolysis/pigment nephropathy.   Last dialysis was on 8/17.   Great UOP and downtrending SCr, no furthe HD indicated TDC removed 8/22 Daily weights, Daily Renal Panel, Strict I/Os, Avoid nephrotoxins (NSAIDs, judicious IV Contrast)   #Mild hypernatremia because of free water excretion with renal recovery.  Continue every 2 hour free water flushes, increase volume to 400 mL, ad lib. oral intake, CTM  #  Acute postoperative hypoxic/hypercapnic respiratory failure: Decannulated 8/21  #  Lumbar spine stenosis/scoliosis status post decompression and fusion surgery: Ongoing management/surveillance by neurosurgery.  # Shock: resolved and he is currently hypertensive. On amlodipine.   # Acute blood loss anemia: Secondary to surgical associated losses/critical illness, transfusion per primary team.  # Acute multifocal bilateral CVA: Suggestive of embolic disease, supportive management.   Subjective:  Continues to have robust urine output, 3.5 L documented yesterday, had 7.1 L input including free water flushes 300 mL every 2 hours Creatinine continues to improve, today is 2.3, K4.0. Serum sodium 152, stable but elevated TDC removed yesterday by IR Afebrile, vital signs are stable Taking some orally, getting nocturnal enteral nutrition, continuing to receive free water flushes   Objective Vital signs in last 24 hours: Vitals:   01/01/21 0017 01/01/21 0333 01/01/21 0719 01/01/21 1157  BP: 136/85 126/68 135/77 (!) 162/87  Pulse: (!) 104 97 90 87  Resp: (!) '22 14 11 19  '$ Temp: 100 F (37.8 C) 99.8 F (37.7 C) 98.7 F (37.1 C) 98.7 F (37.1 C)  TempSrc: Axillary Axillary Oral Oral  SpO2: 94% 99% 97% (!) 8%  Weight:      Height:       Weight change:   Intake/Output Summary (Last 24 hours) at 01/01/2021 1316 Last data filed at  01/01/2021 1219 Gross per 24 hour  Intake 7852.89 ml  Output 4050 ml  Net 3802.89 ml        Labs: Basic Metabolic Panel: Recent Labs  Lab 12/30/20 0334 12/31/20 0348 01/01/21 0407  NA 150* 151* 152*  K 4.2 4.0 4.0  CL 116* 119* 119*  CO2 '24 25 25  '$ GLUCOSE 110* 128* 139*  BUN 87* 75* 62*  CREATININE 3.25* 2.68* 2.32*  CALCIUM 8.6* 8.5* 8.7*  PHOS 4.2 3.6 3.9    Liver Function Tests: Recent Labs  Lab 12/30/20 0334 12/31/20 0348 01/01/21 0407  ALBUMIN 2.2* 2.1* 2.2*    No results for input(s): LIPASE, AMYLASE in the last 168 hours. No results for input(s): AMMONIA in the last 168 hours. CBC: Recent Labs  Lab 12/27/20 0234 12/28/20 0518 12/29/20 0440 12/30/20 0334 01/01/21 0407  WBC 14.0* 16.5* 13.6* 15.1* 12.6*  HGB 8.1* 8.7* 8.0* 7.9* 7.5*  HCT 25.4* 26.7* 24.8* 25.3* 24.5*  MCV 84.9 85.0 85.5 88.2 88.8  PLT 311 378 430* 453* 353    Cardiac Enzymes: No results for input(s): CKTOTAL, CKMB, CKMBINDEX, TROPONINI in the last 168 hours. CBG: Recent Labs  Lab 12/31/20 2005 01/01/21 0014 01/01/21 0332 01/01/21 0738 01/01/21 1155  GLUCAP 129* 112* 123* 108* 106*     Iron Studies: No results for input(s): IRON, TIBC, TRANSFERRIN, FERRITIN in the last 72 hours. Studies/Results: IR Removal Tun Cv Cath W/O FL  Result Date: 12/31/2020 INDICATION: Resolved AKI, no longer needs access for dialysis. Request for tunneled dialysis catheter removal. EXAM: REMOVAL OF TUNNELED HEMODIALYSIS CATHETER MEDICATIONS: 10 mL 1 %  lidocaine COMPLICATIONS: None immediate. PROCEDURE: Informed written consent was obtained from the patient following an explanation of the procedure, risks, benefits and alternatives to treatment. A time out was performed prior to the initiation of the procedure. Sterile technique was utilized including mask, sterile gloves, sterile drape, and hand hygiene. ChloraPrep was used to prep the patient's right neck, chest and existing catheter. 1% lidocaine  was injected around the catheter and the subcutaneous tunnel. The catheter was dissected out using scissors and curved hemostats until the cuff was freed from the surrounding fibrous sheath. The catheter was removed intact. Hemostasis was obtained with manual compression. A dressing was placed. The patient tolerated the procedure well without immediate post procedural complication. IMPRESSION: Successful removal of tunneled dialysis catheter. Read by: Durenda Guthrie, PA-C Electronically Signed   By: Ruthann Cancer M.D.   On: 12/31/2020 14:50    Medications: Infusions:  sodium chloride Stopped (12/25/20 1749)   heparin 2,400 Units/hr (01/01/21 1209)    Scheduled Medications:  alteplase  2 mg Intracatheter Once   amLODipine  2.5 mg Per Tube Daily   aspirin  81 mg Per Tube Daily   bacitracin   Topical Daily   chlorhexidine  15 mL Mouth Rinse BID   Chlorhexidine Gluconate Cloth  6 each Topical Daily   cholecalciferol  2,000 Units Per Tube BID   ciprofloxacin  1 drop Left Eye Q4H while awake   feeding supplement (NEPRO CARB STEADY)  1,000 mL Per Tube Q24H   feeding supplement (NEPRO CARB STEADY)  237 mL Oral BID BM   feeding supplement (PROSource TF)  45 mL Per Tube TID   folic acid  1 mg Per Tube Daily   free water  400 mL Per Tube Q2H   gabapentin  300 mg Per Tube Daily   lamoTRIgine  100 mg Per Tube BID   levETIRAcetam  750 mg Per Tube BID   mouth rinse  15 mL Mouth Rinse q12n4p   pantoprazole sodium  40 mg Per Tube QHS   QUEtiapine  200 mg Per Tube QHS   sertraline  100 mg Per Tube QPC breakfast   sodium chloride flush  10-40 mL Intracatheter Q12H   sodium chloride flush  3 mL Intravenous Q12H   thiamine injection  100 mg Intravenous Daily   vitamin B-12  1,000 mcg Per Tube Daily   zinc sulfate  220 mg Per Tube Daily    have reviewed scheduled and prn medications.  Physical Exam: General: Comfortable, . Heart:RRR, s1s2 nl Lungs: Coarse breath sound anteriorly. Abdomen:soft,  Non-tender, non-distended Extremities:No edema  Rexene Agent 01/01/2021,1:16 PM  LOS: 20 days

## 2021-01-01 NOTE — Plan of Care (Signed)
Around 1950, pt's significant other called staff to room stating pt was having a seizure and stated pt became sweaty then "talked in a scrambled sentence". RN assessed pt, no obvious symptoms of seizure noted at time of assessment. Pt answering questions appropriately but appears anxious and states he feels like he is about to have a seizure. Vitals stable. CBG 129. Approximately 15 minutes later, pt began having twitching to left side of his face which lasted approximately 10 minutes. No other symptoms noted. Covering provider paged and RRT nurse Shanon Brow at bedside but twitching resolved before RRT nurse arrival. After twitching resolved, pt reports feeling better and no longer felt like he was having a seizure.  No further symptoms the rest of the shift.    Problem: Clinical Measurements: Goal: Ability to maintain clinical measurements within normal limits will improve Outcome: Progressing   Problem: Activity: Goal: Risk for activity intolerance will decrease Outcome: Progressing   Problem: Nutrition: Goal: Adequate nutrition will be maintained Outcome: Progressing   Problem: Skin Integrity: Goal: Risk for impaired skin integrity will decrease Outcome: Progressing

## 2021-01-01 NOTE — Progress Notes (Signed)
[facility] for IV Heparin Indication: DVT  No Known Allergies  Patient Measurements: Height: 6' (182.9 cm) Weight: 108 kg (238 lb 1.6 oz) IBW/kg (Calculated) : 77.6 Heparin Dosing Weight: 99 kg   Vital Signs: Temp: 98.7 F (37.1 C) (08/23 0719) Temp Source: Oral (08/23 0719) BP: 135/77 (08/23 0719) Pulse Rate: 90 (08/23 0719)  Labs: Recent Labs    12/30/20 0334 12/31/20 0348 12/31/20 0522 12/31/20 1408 01/01/21 0407  HGB 7.9*  --   --   --  7.5*  HCT 25.3*  --   --   --  24.5*  PLT 453*  --   --   --  353  APTT 52* 106* 112* 68* 66*  HEPARINUNFRC  --   --   --  0.46  --   CREATININE 3.25* 2.68*  --   --  2.32*    Estimated Creatinine Clearance: 44.1 mL/min (A) (by C-G formula based on SCr of 2.32 mg/dL (H)).  Assessment: 60 yr old male with RLE DVT, S/P recent lumbar fusion.  Patient continues on IV heparin and was transitioned to [facility] per nephrology on 8/14.   Continues to be therapeutic on 2400 units/hr per aPTT  Goal of Therapy:  aPTT 50-75 seconds per neurosurgery Monitor platelets by anticoagulation protocol: Yes   Plan:  Continue heparin gtt at 2400 units/hr Daily aPTT, CBC, s/s bleeding Ability to transition to Eliquis  Bertis Ruddy, PharmD Clinical Pharmacist ED Pharmacist Phone # [phone] 01/01/2021 10:06 AM

## 2021-01-01 NOTE — Progress Notes (Signed)
Physical Therapy Treatment Patient Details Name: Keith Dixon MRN: [id number] DOB: 1960/11/28 Today's Date: 01/01/2021    History of Present Illness Pt is a 60 y.o. male who presented 12/12/20 with back and R>L leg pain. S/p DLIFs L1-2 L2-3 L34 L4-5 and T10 pelvis decompression and fusion 8/3 and second surgery 8/4. ETT 8/4 - 8/15, re-intubated 8/15. Found to have R lower extremity DVT and left IJ DVT with IVC filter placed 8/6. AKI. S/p tracheostomy 8/16 - now capped. 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 PMH: PTSD, OSA, HTN, tobacco use, bipolar, degenerative scoliosis, seizures    PT Comments    Pt requiring total A x 2 for bed mobility and sitting EOB.  Unable to progress to standing due to pain and correct brace not in room (has been ordered).  Received notification that insurance does not approve CIR level of care so updated to SNF   Follow Up Recommendations  SNF     Equipment Recommendations  Wheelchair (measurements PT);Wheelchair cushion (measurements PT);Hospital bed;Rolling walker with 5" wheels (hoyer)    Recommendations for Other Services       Precautions / Restrictions Precautions Precautions: Fall;Back Precaution Booklet Issued: No Precaution Comments: spinal precautions, cortrak, wound vac Required Braces or Orthoses: Other Brace (brace in room does not fit/ pending hanger brace ordered 8/19 Dr Saintclair Halsted second order placed 8/22)    Mobility  Bed Mobility Overal bed mobility: Needs Assistance Bed Mobility: Rolling;Supine to Sit;Sit to Supine Rolling: Total assist;+2 for physical assistance   Supine to sit: +2 for physical assistance;Total assist Sit to supine: +2 for physical assistance;Total assist   General bed mobility comments: Pt unable to use R UE to push up into sitting with hob elevated 35 degrees. pt posterior lean and requires total +2 (A) and extensive effort via OT/PT  to sustain EOB initially. Next session recommend increase HOB higher and pivot helicopter to decrease burden on therapists for safety    Transfers                 General transfer comment: unable - pain , weak, incorrect brace in room  Ambulation/Gait                 Stairs             Wheelchair Mobility    Modified Rankin (Stroke Patients Only) Modified Rankin (Stroke Patients Only) Pre-Morbid Rankin Score: Moderate disability Modified Rankin: Severe disability     Balance Overall balance assessment: Needs assistance Sitting-balance support: Bilateral upper extremity supported;Feet supported Sitting balance-Leahy Scale: Zero Sitting balance - Comments: strong posterior L lean; EOB for 8 mins                                    Cognition Arousal/Alertness: Awake/alert Behavior During Therapy: Flat affect Overall Cognitive Status: Difficult to assess                                 General Comments: pt expressed wanting to drink with cup to lips but not attempting to use arms at all to hold cup at all. Pt agreeable to EOB sitting and very internally fixed on pain and teaful. Pt L posterior pushing until cup present to drink and then patient with some core activation noted  Exercises      General Comments General comments (skin integrity, edema, etc.): BP stable at this time, noted to have wound on L heel area with dressing. blister at the top of L ankle - BIL pravalon pillow boots applied due to skin integrity      Pertinent Vitals/Pain Pain Assessment: Faces Faces Pain Scale: Hurts whole lot Pain Location: generalized to back Pain Descriptors / Indicators: Discomfort;Grimacing Pain Intervention(s): Limited activity within patient's tolerance;Monitored during session;Repositioned;Relaxation    Home Living                      Prior Function            PT Goals (current goals can now be found in the  care plan section) Acute Rehab PT Goals Patient Stated Goal: to drink something Progress towards PT goals: Not progressing toward goals - comment (limited by pain and incorrect brace in room (correct brace has been ordered))    Frequency    Min 5X/week      PT Plan Discharge plan needs to be updated (CIR not option with pt's insurance - updated to SNF)    Co-evaluation PT/OT/SLP Co-Evaluation/Treatment: Yes Reason for Co-Treatment: Complexity of the patient's impairments (multi-system involvement);For patient/therapist safety PT goals addressed during session: Mobility/safety with mobility OT goals addressed during session: ADL's and self-care;Strengthening/ROM      AM-PAC PT "6 Clicks" Mobility   Outcome Measure  Help needed turning from your back to your side while in a flat bed without using bedrails?: Total Help needed moving from lying on your back to sitting on the side of a flat bed without using bedrails?: Total Help needed moving to and from a bed to a chair (including a wheelchair)?: Total Help needed standing up from a chair using your arms (e.g., wheelchair or bedside chair)?: Total Help needed to walk in hospital room?: Total Help needed climbing 3-5 steps with a railing? : Total 6 Click Score: 6    End of Session   Activity Tolerance: Patient limited by pain Patient left: in bed;with call bell/phone within reach;with bed alarm set (HOB 35 degrees, cortrak restarted) Nurse Communication: Mobility status PT Visit Diagnosis: Muscle weakness (generalized) (M62.81);Difficulty in walking, not elsewhere classified (R26.2);Other symptoms and signs involving the nervous system (R29.898);Hemiplegia and hemiparesis;Pain Hemiplegia - Right/Left: Right Hemiplegia - dominant/non-dominant: Dominant Hemiplegia - caused by: Cerebral infarction     Time: [id number] PT Time Calculation (min) (ACUTE ONLY): 32 min  Charges:  $Therapeutic Activity: 8-22 mins                      Abran Richard, PT Acute Rehab Services Pager [phone] [facility] [phone]    Karlton Lemon 01/01/2021, 3:49 PM

## 2021-01-01 NOTE — Progress Notes (Signed)
PROGRESS NOTE    Keith Dixon  [id number] DOB: 27-Oct-1960 DOA: 12/12/2020 PCP: Reubin Milan, MD  Primary service: Neurosurgery  Brief Narrative: Keith Dixon is a 60 y.o. male with history of hypertension, obesity, PTSD, seizures, bipolar disorder.  Patient presented secondary to worsening leg pain and neurogenic claudication symptoms for thoracolumbar decompression and fusion.  Postop, patient developed persistent respiratory failure requiring admission to the ICU and continuation of ventilator support.  During admission, patient developed oliguric AKI requiring CRRT, multiple bilateral strokes with neurology consultation and management.  Patient required tracheostomy in order to come off the ventilator.    ICU Significant Events: 8/3 Admit to neurosurgery, underwent  anterior lateral Interbody fusion of L1-L5, notably had difficult foley insertion requiring urethral dilation 8/4 Stage 2 posterior decompression and fusion, left intubated post-procedure and PCCM consulted 8/5 AKI seen on am labs with evidence of volume depletion on POC [place] 8/6: Patient AKI was due to acute rhabdomyolysis in the setting of prolonged surgery, he remained oliguric.  Ultrasound lower extremity showed acute right lower extremity DVT and left IJ DVT, IVC filter placed 8/7: Patient serum creatinine continues to trend up, he remained oliguric, started on CRRT Head CT showed small multiple bilateral strokes.  EEG was placed  8/9>. No seizures per EEG, remains on Levo, Propofol and fentanyl with good vent synchrony. Remains on CVVH with UF goal of 50 cc per hour, but increasing pressor needs this am  8/11: off pressors, mental status precluding extubation 8/12: Tunneled HD line placement 8/13: Tolerating some PSV on fentanyl 8/13 MRI/A brain > multiple acute/subacute CVA in the cerebellum, left > right thalami, bilateral cerebral hemispheres.  No abnormal findings in the neck, normal angiography 8/14  Began HD 8/15 Extubated and declined to due to stridor and required reintubation.  8/16 S/p tracheostomy. Hgb 6.7 transfused 1u pRBC likely from actively draining serosanguinous fluid from surgical site on back. NSGY expressed fluid from surgical site until resolution. 8/17 UOP improving  Assessment & Plan:   Active Problems:   Lumbar spine scoliosis   Spinal stenosis of lumbar region   Arterial line in place   Encounter for central line placement   Cerebral thrombosis with cerebral infarction   Altered mental status   Respiratory failure ([place])   Tracheostomy in place [facility])   History of tracheostomy   Acute respiratory failure with hypoxia and hypercapnia Postoperative respiratory failure Patient was initially managed intubated on 8/4, managed on via ventilator and extubated on 8/15. Unfortunately, patient developed decline in setting of stridor, requiring same-day reintubation and subsequent tracheostomy on 8/16. Currently weaned off the ventilator. Trach decannulation on 8/20 secondary to malpositioned tracheostomy.  Fever Uncertain source at this time but patient recently on ventilator. No symptoms. Associated slightly worsened leukocytosis. Blood cultures with no growth. Last fever on 8/21. Resolved without treatment  Hypernatremia Continues to worsen. Free water started on 8/20 and increased to 400 mL q2 hours -Continue free water per nephrology -Daily BMP  Acute metabolic encephalopathy Resolved.  AKI with oliguria Likely secondary to rhabdomyolysis/ATN. Nephrology consulted and managed with CRRT/hemodialysis. Currently improving. UOP of 2.225 L over the last 24 hours. Management per nephrology  Spinal stenosis s/p thoracolumbar fusion/decompression -Management per primary.   Acute RLE/left IJ DVT Patient started on heparin IV. Can transition to Eliquis prior to discharge  Acute bilateral cortical MCA/ACA watershed cortical/subcortical left thalamic  infarcts Neurology consulted. Concern for for perioperative hypotension in setting of intracranial stenosis but could not rule out  cardioembolic source. Carotid dopplers with 1-39% stenosis, MRA head/neck negative, TCD with bubble was negative for PFO, Transthoracic Echocardiogram significant for no thrombus and normal left atrial size. LDL of 30 and hemoglobin A1C of 5.8%. final recommendations for Eliquis once ready to change to PO anticoagulation.  Acute on chronic anemia Perioperative blood loss Patient has required 5 units of PRBC. Stable.  Seizure disorder Continue Keppra, renally dosed with the help of pharmacy  Poor nutrition In setting of critically ill status, recent intubation. Patient is currently with a CorTrak feeding tube and on tube feeds. Speech therapy consulted for swallow evaluation. Dietitian is consulted for nutrition management and will switch to nocturnal tube feed. Speech therapy evaluated and have recommended a dysphagia 3 diet with thin liquid.  -tube feeds 1000 mL @ 75 ml/hr overnight  Chest hematoma Stable on heparin IV  Pressure injury Left heel, unsure if present on admission.   DVT prophylaxis: Per primary Code Status:   Code Status: Full Code Family Communication: None at bedside Disposition Plan: Per primary. Appears patient is being set up for possible CIR discharge   Consultants:  PCCM Nephrology Neurology Urology General medicine  Procedures:  ANTERIOR LATERAL INTERBODY FUSION L1-L5 (12/12/2020) CYSTOSCOPY/URETHRAL DILATION/FOLEY INSERTION (12/12/2020) THORACIC - ILIAC FUSION; L5-S1 TRANSFORAMINAL LUMBAR INTERBODY FUSION (12/13/2020) INTUBATION/MECHANICAL VENTILATION (8/4-8/15) IVC FILTER PLACEMENT (12/16/2020) EEG (12/16/2020)  Impression and clinical correlation: This EEG was obtained while unresponsive and is abnormal due to moderate diffuse slowing indicative of global cerebral dysfunction.  LTM EEG (8/7-8/8)  IMPRESSION: This technically  difficult study was initially suggestive of moderate diffuse encephalopathy, nonspecific etiology. After around 1830 on 12/16/2020, eeg was suggestive of profound diffuse encephalopathy, likely due to sedation. No seizures or epileptiform discharges were seen throughout the recording.  LTM EEG (8/8-8/9).   IMPRESSION: This study is suggestive of moderate to severe diffuse encephalopathy, nonspecific etiology. No seizures or epileptiform discharges were seen throughout the recording.  TUNNELED HD LINE PLACEMENT (12/21/2020) [place] (12/21/2020) INTUBATION/MECHANICAL VENTILATION (8/15-8/16/2022) TRACHEOSTOMY (12/25/2020)   Antimicrobials: Vancomycin IV Cefazolin IV    Subjective: No concerns today.  Objective: BP (!) 161/91 (BP Location: Right Arm)   Pulse 85   Temp 98 F (36.7 C) (Oral)   Resp 18   Ht 6' (1.829 m)   Wt 108 kg   SpO2 98%   BMI 32.29 kg/m   Examination:  General exam: Appears calm and comfortable Respiratory system: Clear to auscultation. Respiratory effort normal. Cardiovascular system: S1 & S2 heard, RRR. No murmurs, rubs, gallops or clicks. Gastrointestinal system: Abdomen is nondistended, soft and nontender. No organomegaly or masses felt. Normal bowel sounds heard. Central nervous system: Alert and oriented. No focal neurological deficits. Musculoskeletal: No edema. No calf tenderness Skin: No cyanosis. No rashes Psychiatry: Judgement and insight appear normal. Mood & affect appropriate.    Data Reviewed: I have personally reviewed following labs and imaging studies  CBC Lab Results  Component Value Date   WBC 12.6 (H) 01/01/2021   RBC 2.76 (L) 01/01/2021   HGB 7.5 (L) 01/01/2021   HCT 24.5 (L) 01/01/2021   MCV 88.8 01/01/2021   MCH 27.2 01/01/2021   PLT 353 01/01/2021   MCHC 30.6 01/01/2021   RDW 18.6 (H) 01/01/2021   LYMPHSABS 0.7 12/19/2020   MONOABS 0.7 12/19/2020   EOSABS 0.2 12/19/2020   BASOSABS 0.0 99991111     Last metabolic panel Lab Results  Component Value Date   NA 152 (H) 01/01/2021   K 4.0 01/01/2021  CL 119 (H) 01/01/2021   CO2 25 01/01/2021   BUN 62 (H) 01/01/2021   CREATININE 2.32 (H) 01/01/2021   GLUCOSE 139 (H) 01/01/2021   GFRNONAA 32 (L) 01/01/2021   GFRAA >60 04/25/2019   CALCIUM 8.7 (L) 01/01/2021   PHOS 3.9 01/01/2021   PROT 5.0 (L) 12/19/2020   ALBUMIN 2.2 (L) 01/01/2021   BILITOT 0.7 12/19/2020   ALKPHOS 294 (H) 12/19/2020   AST 79 (H) 12/19/2020   ALT 9 12/19/2020   ANIONGAP 8 01/01/2021    CBG (last 3)  Recent Labs    01/01/21 0738 01/01/21 1155 01/01/21 1524  GLUCAP 108* 106* 98      GFR: Estimated Creatinine Clearance: 44.1 mL/min (A) (by C-G formula based on SCr of 2.32 mg/dL (H)).  Coagulation Profile: No results for input(s): INR, PROTIME in the last 168 hours.   Recent Results (from the past 240 hour(s))  Culture, blood (routine x 2)     Status: None (Preliminary result)   Collection Time: 12/30/20  9:24 AM   Specimen: BLOOD  Result Value Ref Range Status   Specimen Description BLOOD BLOOD LEFT HAND  Final   Special Requests AEROBIC BOTTLE ONLY Blood Culture adequate volume  Final   Culture   Final    NO GROWTH 2 DAYS Performed at [facility], [address] [place], [place] [address]    Report Status PENDING  Incomplete  Culture, blood (routine x 2)     Status: None (Preliminary result)   Collection Time: 12/30/20  9:27 AM   Specimen: BLOOD  Result Value Ref Range Status   Specimen Description BLOOD BLOOD RIGHT HAND  Final   Special Requests AEROBIC BOTTLE ONLY Blood Culture adequate volume  Final   Culture   Final    NO GROWTH 2 DAYS Performed at [facility], [place] [address] [place], [name] [address]    Report Status PENDING  Incomplete        Radiology Studies: IR Removal Tun Cv Cath W/O FL  Result Date: 12/31/2020 INDICATION: Resolved AKI, no longer needs access for dialysis. Request for tunneled  dialysis catheter removal. EXAM: REMOVAL OF TUNNELED HEMODIALYSIS CATHETER MEDICATIONS: 10 mL 1 % lidocaine COMPLICATIONS: None immediate. PROCEDURE: Informed written consent was obtained from the patient following an explanation of the procedure, risks, benefits and alternatives to treatment. A time out was performed prior to the initiation of the procedure. Sterile technique was utilized including mask, sterile gloves, sterile drape, and hand hygiene. ChloraPrep was used to prep the patient's right neck, chest and existing catheter. 1% lidocaine was injected around the catheter and the subcutaneous tunnel. The catheter was dissected out using scissors and curved hemostats until the cuff was freed from the surrounding fibrous sheath. The catheter was removed intact. Hemostasis was obtained with manual compression. A dressing was placed. The patient tolerated the procedure well without immediate post procedural complication. IMPRESSION: Successful removal of tunneled dialysis catheter. Read by: Durenda Guthrie, PA-C Electronically Signed   By: Ruthann Cancer M.D.   On: 12/31/2020 14:50      Scheduled Meds:  alteplase  2 mg Intracatheter Once   amLODipine  2.5 mg Per Tube Daily   aspirin  81 mg Per Tube Daily   bacitracin   Topical Daily   chlorhexidine  15 mL Mouth Rinse BID   Chlorhexidine Gluconate Cloth  6 each Topical Daily   cholecalciferol  2,000 Units Per Tube BID   ciprofloxacin  1 drop  Left Eye Q4H while awake   feeding supplement (NEPRO CARB STEADY)  1,000 mL Per Tube Q24H   feeding supplement (NEPRO CARB STEADY)  237 mL Oral BID BM   feeding supplement (PROSource TF)  45 mL Per Tube TID   folic acid  1 mg Per Tube Daily   free water  400 mL Per Tube Q2H   gabapentin  300 mg Per Tube Daily   lamoTRIgine  100 mg Per Tube BID   levETIRAcetam  750 mg Per Tube BID   mouth rinse  15 mL Mouth Rinse q12n4p   pantoprazole sodium  40 mg Per Tube QHS   QUEtiapine  200 mg Per Tube QHS   sertraline   100 mg Per Tube QPC breakfast   sodium chloride flush  10-40 mL Intracatheter Q12H   sodium chloride flush  3 mL Intravenous Q12H   thiamine injection  100 mg Intravenous Daily   vitamin B-12  1,000 mcg Per Tube Daily   zinc sulfate  220 mg Per Tube Daily   Continuous Infusions:  sodium chloride Stopped (12/25/20 1749)   heparin 2,400 Units/hr (01/01/21 1209)     LOS: 20 days     Cordelia Poche, MD [facility] 01/01/2021, 5:26 PM  If 7PM-7AM, please contact night-coverage [email or web address]

## 2021-01-01 NOTE — Progress Notes (Signed)
Subjective: NAEs o/n.  Tunneled dialysis catheter removed yesterday  Objective: Vital signs in last 24 hours: Temp:  [98.5 F (36.9 C)-100 F (37.8 C)] 98.7 F (37.1 C) (08/23 0719) Pulse Rate:  [90-104] 90 (08/23 0719) Resp:  [11-26] 11 (08/23 0719) BP: (126-145)/(68-91) 135/77 (08/23 0719) SpO2:  [94 %-99 %] 97 % (08/23 0719)  Intake/Output from previous day: 08/22 0701 - 08/23 0700 In: 7089.9 [P.O.:360; I.V.:556.3; NG/GT:6173.6] Out: 3550 [Urine:3550] Intake/Output this shift: No intake/output data recorded.  Eyes open, NAD.  Trach site capped Right flank incision c/d/I Condom catheter in place Back incision examined; Prevena in place, no redness or tenderness.  Minimal output.  Large soft stool present.  Lab Results: Recent Labs    12/30/20 0334 01/01/21 0407  WBC 15.1* 12.6*  HGB 7.9* 7.5*  HCT 25.3* 24.5*  PLT 453* 353   BMET Recent Labs    12/31/20 0348 01/01/21 0407  NA 151* 152*  K 4.0 4.0  CL 119* 119*  CO2 25 25  GLUCOSE 128* 139*  BUN 75* 62*  CREATININE 2.68* 2.32*  CALCIUM 8.5* 8.7*    Studies/Results: IR Removal Tun Cv Cath W/O FL  Result Date: 12/31/2020 INDICATION: Resolved AKI, no longer needs access for dialysis. Request for tunneled dialysis catheter removal. EXAM: REMOVAL OF TUNNELED HEMODIALYSIS CATHETER MEDICATIONS: 10 mL 1 % lidocaine COMPLICATIONS: None immediate. PROCEDURE: Informed written consent was obtained from the patient following an explanation of the procedure, risks, benefits and alternatives to treatment. A time out was performed prior to the initiation of the procedure. Sterile technique was utilized including mask, sterile gloves, sterile drape, and hand hygiene. ChloraPrep was used to prep the patient's right neck, chest and existing catheter. 1% lidocaine was injected around the catheter and the subcutaneous tunnel. The catheter was dissected out using scissors and curved hemostats until the cuff was freed from the  surrounding fibrous sheath. The catheter was removed intact. Hemostasis was obtained with manual compression. A dressing was placed. The patient tolerated the procedure well without immediate post procedural complication. IMPRESSION: Successful removal of tunneled dialysis catheter. Read by: Durenda Guthrie, PA-C Electronically Signed   By: Ruthann Cancer M.D.   On: 12/31/2020 14:50    Assessment/Plan: 60 yo M s/p T10-pelvis reconstruction with postop ARF that appears to have resolved, small CVAs and DVTs - will need PT/OT for severe deconditioning - speech consult for swallow and communication - will continue Prevena for 1-2 more days to help maintain barrier given frequent liquidy stools. If wound healing well, will likely remove staples soon after this - will continue hep gtt for now until we have ascertained wound is well-healed   Vallarie Mare 01/01/2021, 9:42 AM

## 2021-01-01 NOTE — Progress Notes (Signed)
Occupational Therapy Treatment Patient Details Name: Keith Dixon MRN: [id number] DOB: 16-Aug-1960 Today's Date: 01/01/2021    History of present illness Pt is a 60 y.o. male who presented 12/12/20 with back and R>L leg pain. S/p DLIFs L1-2 L2-3 L34 L4-5 and T10 pelvis decompression and fusion 8/3 and second surgery 8/4. ETT 8/4 - 8/15, re-intubated 8/15. Found to have R lower extremity DVT and left IJ DVT with IVC filter placed 8/6. AKI. S/p tracheostomy 8/16. 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 PMH: PTSD, OSA, HTN, tobacco use, bipolar, degenerative scoliosis, seizures   OT comments  Pt without TLSO clamshell brace ordered by Dr Saintclair Halsted 8/19 then second order 8/22. OT called [employer]anger to confirm brace ordered and informed pending delivery on 8/24. Pt with extreme pain EOB without brace so did not attempt further progression this session. Pt pushing posteriorly L at eob with total +2 max to total (A) at times to keep eob safely due to discomfort. Recommendation for CIr and noted that family unable to give 24/7 (A) so will likely be SNF level care.     Follow Up Recommendations  CIR    Equipment Recommendations  3 in 1 bedside commode;Wheelchair (measurements OT);Wheelchair cushion (measurements OT);Hospital bed    Recommendations for Other Services Rehab consult    Precautions / Restrictions Precautions Precautions: Fall;Back Precaution Comments: spinal precautions, cortrak wound vac Required Braces or Orthoses: Other Brace (brace in room does not fit/ pending [employer] brace ordered 8/19 Dr Saintclair Halsted second order placed 8/22)       Mobility Bed Mobility Overal bed mobility: Needs Assistance Bed Mobility: Rolling;Supine to Sit;Sit to Supine Rolling: Total assist;+2 for physical assistance   Supine to sit: +2 for physical assistance;Total assist Sit to supine: +2 for physical assistance;Total assist    General bed mobility comments: Pt unable to use R UE to push up into sitting with hob elevated 35 degrees. pt posterior lean and requires total +2 (A) and extensive effort via OT/PT to sustain EOB initially. Next session recommend increase HOB higher and pivot helicopter to decrease burden on therapists for safety    Transfers                      Balance Overall balance assessment: Needs assistance Sitting-balance support: Bilateral upper extremity supported;Feet supported Sitting balance-Leahy Scale: Zero Sitting balance - Comments: strong posterior L lean                                   ADL either performed or assessed with clinical judgement   ADL Overall ADL's : Needs assistance/impaired Eating/Feeding: Total assistance Eating/Feeding Details (indicate cue type and reason): drinking from cup with total (A). pt does not initiate use of arms                                   General ADL Comments: pt without brace so limited to EOB at this time. Pt with increased pain and tearful at eob. pt with strong posterior L push so unable to progress to stand attempts. pt return to supine due to pain     Vision       Perception     Praxis      Cognition Arousal/Alertness: Awake/alert Behavior During Therapy: Flat affect Overall Cognitive Status:  Difficult to assess                                 General Comments: pt expressed wanting to drink with cup to lips but not attempting to use arms at all to hold cup at all. Pt agreeable to EOB sitting and very internally fixed on pain and teaful. pt L posterior pushing until cup present and then patient with some core activation noted        Exercises     Shoulder Instructions       General Comments BP stable at this time, noted to have wound on L heel area with dressing. blister at the top of L ankle - BIL pravalon pillow boots applied due to skin integrity    Pertinent  Vitals/ Pain       Pain Assessment: Faces Faces Pain Scale: Hurts whole lot Pain Location: generalized to back Pain Descriptors / Indicators: Discomfort Pain Intervention(s): Monitored during session;Repositioned;Premedicated before session  Home Living                                          Prior Functioning/Environment              Frequency  Min 2X/week        Progress Toward Goals  OT Goals(current goals can now be found in the care plan section)  Progress towards OT goals: Not progressing toward goals - comment  Acute Rehab OT Goals Patient Stated Goal: to drink something OT Goal Formulation: Patient unable to participate in goal setting Time For Goal Achievement: 01/11/21 Potential to Achieve Goals: Good ADL Goals Pt Will Perform Grooming: with mod assist;sitting Additional ADL Goal #1: Pt will complete bed mobility mod +2 (A) as precursor to adls. Additional ADL Goal #2: pt will tolerate eob sitting with mod (A) for static sitting balance Additional ADL Goal #3: pt will complete basic transfer total +2 mod (A)  Plan Discharge plan remains appropriate    Co-evaluation    PT/OT/SLP Co-Evaluation/Treatment: Yes Reason for Co-Treatment: Complexity of the patient's impairments (multi-system involvement);For patient/therapist safety;To address functional/ADL transfers   OT goals addressed during session: ADL's and self-care;Strengthening/ROM      AM-PAC OT "6 Clicks" Daily Activity     Outcome Measure   Help from another person eating meals?: A Lot Help from another person taking care of personal grooming?: A Lot Help from another person toileting, which includes using toliet, bedpan, or urinal?: A Lot Help from another person bathing (including washing, rinsing, drying)?: A Lot Help from another person to put on and taking off regular upper body clothing?: A Lot Help from another person to put on and taking off regular lower body  clothing?: A Lot 6 Click Score: 12    End of Session Equipment Utilized During Treatment: Oxygen  OT Visit Diagnosis: Unsteadiness on feet (R26.81);Muscle weakness (generalized) (M62.81);Pain   Activity Tolerance Patient limited by pain   Patient Left in bed;with call bell/phone within reach;with bed alarm set;Other (comment)   Nurse Communication Mobility status;Precautions        Time: [id number] OT Time Calculation (min): 25 min  Charges: OT General Charges $OT Visit: 1 Visit OT Treatments $Self Care/Home Management : 8-22 mins   Brynn, OTR/L  Acute Rehabilitation Services Pager: [phone] Office: [phone] .  Jeri Modena 01/01/2021, 3:09 PM

## 2021-01-01 NOTE — Progress Notes (Signed)
Nutrition Follow-up  DOCUMENTATION CODES:   Not applicable  INTERVENTION:   Transition to nocturnal tube feeds via Cortrak: - Nepro @ 75 ml/hr x 12 hours from 1800 to 0600 (total of 900 ml) - ProSource TF 45 ml TID - Free water flushes per Nephrology, currently 300 ml q 2 hours  Nocturnal tube feeding regimen provides 1740 kcal, 106 grams of protein, and 654 ml of H2O (meets 79% of kcal needs and 82% of protein needs).  Total free water with flushes: 4254 ml  - Nepro Shake po BID between meals, each supplement provides 425 kcal and 19 grams protein  - Encourage PO intake  NUTRITION DIAGNOSIS:   Increased nutrient needs related to wound healing as evidenced by estimated needs.  Ongoing, being addressed via diet advancement, nocturnal TF, and oral nutrition supplements  GOAL:   Patient will meet greater than or equal to 90% of their needs  Progressing  MONITOR:   TF tolerance, Diet advancement  REASON FOR ASSESSMENT:   Consult Enteral/tube feeding initiation and management  ASSESSMENT:   Pt with PMH of anxiety, PTSD, bipolar 1 disorder, depression, HTN, Sz, sleep apnea, and a smoker who was admitted for severe scoliosis with multi-level listhesis.  8/03 - s/p L1-2, L2-3, L3-4, L4-5, DLIFs 8/04 - T10-pelvis (10 procedure) 8/07 - started on CRRT 8/12 - CRRT stopped, tunneled HD catheter placed, Cortrak placed with tip gastric 8/15 - extubated and re-intubated later that day 8/16 - s/p trach 8/20 - trach decannulation secondary to malpositioned tracheostomy 8/22 - diet advanced to dysphagia 3 with thin liquids  Discussed pt with SLP and RN. Will transition pt to nocturnal tube feeds to stimulate appetite and promote PO intake during the day.  Spoke with pt at bedside who was agreeable with plan to transition to nocturnal tube feeds. Pt reports that he did well with breakfast this morning and NT in room notes that pt consumed 60% of his breakfast meal tray. Pt with  a carton of milk in front of him and had consumed 100%. Discussed importance of increasing PO intake in order to have tube feeds d/c and Cortrak removed. Pt expressed understanding.  Pt receiving free water flushes per tube due to hypernatremia. Nephrology is managing free water flushes. Pt with mild pitting generalized edema and mild pitting edema to BUE.  Pt's last HD was on 8/17. Per Nephrology, pt with good UP and downtrending creatinine so no further HD indicated at this time.  Current TF: Nepro @ 55 ml/hr, ProSource TF 45 ml TID, free water flushes of 300 ml q 2 hours  Meal Completion: 60% for breakfast this AM, 20% for lunch  Medications reviewed and include: cholecalciferol, folic acid, lamictal, protonix, thiamine, vitamin B-12, zinc sulfate 220 mg daily, heparin drip  Labs reviewed: sodium 152, BUN 62, creatinine 2.32, hemoglobin 7.5 CBG's: 108-129 x 24 hours  UOP: 3550 ml x 24 hours I/O's: +19.0 L since admit  Diet Order:   Diet Order             DIET DYS 3 Room service appropriate? Yes with Assist; Fluid consistency: Thin  Diet effective now                   EDUCATION NEEDS:   Not appropriate for education at this time  Skin:  Skin Assessment: Skin Integrity Issues: DTI: L heel Other: previous trach site  Last BM:  01/01/21 small type 6  Height:   Ht Readings from Last 1 Encounters:  12/24/20 6' (1.829 m)    Weight:   Wt Readings from Last 1 Encounters:  12/28/20 108 kg    BMI:  Body mass index is 32.29 kg/m.  Estimated Nutritional Needs:   Kcal:  2200-2500  Protein:  130-150 grams  Fluid:  >2 L/day    Gustavus Bryant, MS, RD, LDN Inpatient Clinical Dietitian Please see AMiON for contact information.

## 2021-01-02 DIAGNOSIS — Z4659 Encounter for fitting and adjustment of other gastrointestinal appliance and device: Secondary | ICD-10-CM

## 2021-01-02 LAB — CBC
HCT: 25.1 % — ABNORMAL LOW (ref 39.0–52.0)
Hemoglobin: 7.8 g/dL — ABNORMAL LOW (ref 13.0–17.0)
MCH: 27.6 pg (ref 26.0–34.0)
MCHC: 31.1 g/dL (ref 30.0–36.0)
MCV: 88.7 fL (ref 80.0–100.0)
Platelets: 301 10*3/uL (ref 150–400)
RBC: 2.83 MIL/uL — ABNORMAL LOW (ref 4.22–5.81)
RDW: 18 % — ABNORMAL HIGH (ref 11.5–15.5)
WBC: 11.6 10*3/uL — ABNORMAL HIGH (ref 4.0–10.5)
nRBC: 0 % (ref 0.0–0.2)

## 2021-01-02 LAB — RENAL FUNCTION PANEL
Albumin: 2.2 g/dL — ABNORMAL LOW (ref 3.5–5.0)
Anion gap: 8 (ref 5–15)
BUN: 47 mg/dL — ABNORMAL HIGH (ref 6–20)
CO2: 24 mmol/L (ref 22–32)
Calcium: 8.5 mg/dL — ABNORMAL LOW (ref 8.9–10.3)
Chloride: 117 mmol/L — ABNORMAL HIGH (ref 98–111)
Creatinine, Ser: 1.72 mg/dL — ABNORMAL HIGH (ref 0.61–1.24)
GFR, Estimated: 46 mL/min — ABNORMAL LOW (ref >60)
Glucose, Bld: 114 mg/dL — ABNORMAL HIGH (ref 70–99)
Phosphorus: 3.9 mg/dL (ref 2.5–4.6)
Potassium: 3.9 mmol/L (ref 3.5–5.1)
Sodium: 149 mmol/L — ABNORMAL HIGH (ref 135–145)

## 2021-01-02 LAB — GLUCOSE, CAPILLARY
Glucose-Capillary: 107 mg/dL — ABNORMAL HIGH (ref 70–99)
Glucose-Capillary: 107 mg/dL — ABNORMAL HIGH (ref 70–99)
Glucose-Capillary: 104 mg/dL — ABNORMAL HIGH (ref 70–99)
Glucose-Capillary: 97 mg/dL (ref 70–99)
Glucose-Capillary: 102 mg/dL — ABNORMAL HIGH (ref 70–99)

## 2021-01-02 LAB — APTT
aPTT: 80 seconds — ABNORMAL HIGH (ref 24–36)
aPTT: 63 seconds — ABNORMAL HIGH (ref 24–36)

## 2021-01-02 LAB — MAGNESIUM: Magnesium: 1.6 mg/dL — ABNORMAL LOW (ref 1.7–2.4)

## 2021-01-02 MED ORDER — THIAMINE HCL 100 MG PO TABS
100.0000 mg | ORAL_TABLET | Freq: Every day | ORAL | Status: DC
  Administered 2021-01-03 – 2021-01-07 (×5): 100 mg
  Filled 2021-01-02 (×5): qty 1

## 2021-01-02 NOTE — Progress Notes (Signed)
  Speech Language Pathology Treatment: Dysphagia  Patient Details Name: Keith Dixon MRN: [id number] DOB: 01/27/1961 Today's Date: 01/02/2021 Time: [id number] SLP Time Calculation (min) (ACUTE ONLY): 8 min  Assessment / Plan / Recommendation Clinical Impression  Pt upright in chair and alert throughout the session. When asked about how eating has been going, pt reports no coughing or throat clearing during meals but reports difficulty chewing. SLP asked about self-feeding; pt states daughter feeds him. SLP presented thins and regular solids during the session; pt tolerated both consistencies and showed no overt s/s of aspiration. Pt participated in self-feeding throughout the session. Inspection of oral cavity after consumption of regular solids revealed mild lingual residue that was cleared with a liquid wash. Recommend continue with D3/thin liquid diet to help with fatigue while chewing.    HPI HPI: Pt is a 60 y.o. male who presented 12/12/20 with back and R>L leg pain. S/p DLIFs L1-2 L2-3 L34 L4-5 and T10 pelvis decompression and fusion 8/3 and second surgery 8/4. ETT 8/4 - 8/15, re-intubated 8/15. Found to have R lower extremity DVT and left IJ DVT with IVC filter placed 8/6. AKI. S/p tracheostomy 8/16 and decannulated 8/20. 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 PMH: PTSD, OSA, HTN, tobacco use, bipolar, degenerative scoliosis, seizures      SLP Plan  Continue with current plan of care  Patient needs continued Speech Lanaguage Pathology Services    Recommendations  Diet recommendations: Thin liquid;Dysphagia 3 (mechanical soft) Liquids provided via: Straw;Cup Medication Administration: Whole meds with liquid Supervision: Staff to assist with self feeding;Full supervision/cueing for compensatory strategies Compensations: Slow rate;Small sips/bites Postural Changes and/or Swallow Maneuvers: Seated upright  90 degrees;Upright 30-60 min after meal                Oral Care Recommendations: Oral care BID Follow up Recommendations: Skilled Nursing facility SLP Visit Diagnosis: Dysphagia, unspecified (R13.10) Plan: Continue with current plan of care       GO              Dewitt Rota, SLP-Student   Dewitt Rota 01/02/2021, 4:20 PM

## 2021-01-02 NOTE — Care Management (Signed)
[facility], brief update on patient status per her request.

## 2021-01-02 NOTE — Progress Notes (Signed)
Physical Therapy Treatment Patient Details Name: Keith Dixon MRN: [id number] DOB: Apr 23, 1961 Today's Date: 01/02/2021    History of Present Illness Pt is a 60 y.o. male who presented 12/12/20 with back and R>L leg pain. S/p DLIFs L1-2 L2-3 L34 L4-5 and T10 pelvis decompression and fusion 8/3 and second surgery 8/4. ETT 8/4 - 8/15, re-intubated 8/15. Found to have R lower extremity DVT and left IJ DVT with IVC filter placed 8/6. AKI. S/p tracheostomy 8/16. 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 PMH: PTSD, OSA, HTN, tobacco use, bipolar, degenerative scoliosis, seizures    PT Comments    Progressed to transfer OOB to recliner using the maxisky this date. Pt continues to be limited by overall weakness and decreased endurance, resulting in him requiring maxAx2 to roll. Pt was unable to sustain an anterior lean enough to then push up to stand from recliner this date. Pt would benefit from further sit to stand reps from elevated EOB to posterior aspect of chair. Extensive period of time in sidelying and multiple anterior leans to get back off chair support to assist, per request, in clamshell donning when it arrived, pericare, and with MD removing wound vac. Will continue to follow acutely. Current recommendations remain appropriate.   Follow Up Recommendations  SNF     Equipment Recommendations  Wheelchair (measurements PT);Wheelchair cushion (measurements PT);Hospital bed;Rolling walker with 5" wheels (hoyer lift)    Recommendations for Other Services       Precautions / Restrictions Precautions Precautions: Fall;Back Precaution Booklet Issued: No Precaution Comments: spinal precautions, cortrak Required Braces or Orthoses: Spinal Brace Spinal Brace: Other (comment);Applied in sitting position (clamshell arrived during session after xfer to chair and was applied in sitting) Spinal Brace Comments:  clamshell Restrictions Weight Bearing Restrictions: No    Mobility  Bed Mobility Overal bed mobility: Needs Assistance Bed Mobility: Rolling Rolling: Max assist;+2 for physical assistance         General bed mobility comments: Rolling each direction in bed to place hoyer lift pad and in fully reclined chair for MD to access wound vac site for removal. Cues to flex knees and pull with UEs to roll, maxAx2 to complete.    Transfers Overall transfer level: Needs assistance Equipment used:  (lift)             General transfer comment: Maxisky dependent lift transfer bed > recliner with +2 for safety. Attempted to have pt lean anteriorly to transfer from recliner to stand but pt unable to obtain enough anterior to lean to prepare for stand, even with maxAx2.  Ambulation/Gait             General Gait Details: Unable this date.   Stairs             Wheelchair Mobility    Modified Rankin (Stroke Patients Only) Modified Rankin (Stroke Patients Only) Pre-Morbid Rankin Score: Moderate disability Modified Rankin: Severe disability     Balance Overall balance assessment: Needs assistance Sitting-balance support: Bilateral upper extremity supported;Feet supported Sitting balance-Leahy Scale: Zero Sitting balance - Comments: strong posterior lean Postural control: Posterior lean     Standing balance comment: Unable this date                            Cognition Arousal/Alertness: Awake/alert Behavior During Therapy: Flat affect Overall Cognitive Status: Impaired/Different from baseline Area of Impairment: Memory;Attention;Following commands;Problem solving  Current Attention Level: Sustained Memory: Decreased short-term memory;Decreased recall of precautions Following Commands: Follows one step commands inconsistently;Follows one step commands with increased time     Problem Solving: Slow processing;Decreased  initiation;Difficulty sequencing;Requires verbal cues;Requires tactile cues General Comments: Pt distracted by pain and needs increased time and repeated multi-modal cues to perform tasks.      Exercises      General Comments        Pertinent Vitals/Pain Pain Assessment: Faces Faces Pain Scale: Hurts whole lot Pain Location: generalized to back Pain Descriptors / Indicators: Discomfort;Grimacing;Guarding Pain Intervention(s): Limited activity within patient's tolerance;Monitored during session;Repositioned;Patient requesting pain meds-RN notified    Home Living                      Prior Function            PT Goals (current goals can now be found in the care plan section) Acute Rehab PT Goals Patient Stated Goal: to have bowel movement PT Goal Formulation: With patient/family Time For Goal Achievement: 01/10/21 Potential to Achieve Goals: Fair Progress towards PT goals: Progressing toward goals    Frequency    Min 5X/week      PT Plan Current plan remains appropriate    Co-evaluation              AM-PAC PT "6 Clicks" Mobility   Outcome Measure  Help needed turning from your back to your side while in a flat bed without using bedrails?: Total Help needed moving from lying on your back to sitting on the side of a flat bed without using bedrails?: Total Help needed moving to and from a bed to a chair (including a wheelchair)?: Total Help needed standing up from a chair using your arms (e.g., wheelchair or bedside chair)?: Total Help needed to walk in hospital room?: Total Help needed climbing 3-5 steps with a railing? : Total 6 Click Score: 6    End of Session Equipment Utilized During Treatment: Back brace Activity Tolerance: Patient limited by pain;Patient limited by fatigue Patient left: in chair;with call bell/phone within reach;with chair alarm set;with family/visitor present Nurse Communication: Mobility status;Need for lift  equipment;Patient requests pain meds PT Visit Diagnosis: Muscle weakness (generalized) (M62.81);Difficulty in walking, not elsewhere classified (R26.2);Other symptoms and signs involving the nervous system (R29.898);Hemiplegia and hemiparesis;Pain;Unsteadiness on feet (R26.81) Hemiplegia - Right/Left: Right Hemiplegia - dominant/non-dominant: Dominant Hemiplegia - caused by: Cerebral infarction Pain - part of body:  (back)     Time: [id number] PT Time Calculation (min) (ACUTE ONLY): 53 min  Charges:  $Therapeutic Activity: 53-67 mins                     Moishe Spice, PT, DPT Acute Rehabilitation Services  Pager: [phone] Office: [place] 01/02/2021, 11:37 AM
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
## 2021-01-02 NOTE — Progress Notes (Addendum)
[facility] CONSULT PROGRESS NOTE    Progress Note  Keith Dixon  [id number] DOB: [birth year] DOA: 12/12/2020 PCP: Keith Milan, MD     Brief Narrative:   Keith Dixon is an 60 y.o. male past medical history of essential hypertension, PTSD, seizure and bipolar disorder comes in for worsening leg pain and cauda equina symptoms.  Neurosurgery was admitted the patient perform thoracolumbar decompression and fusion postop the patient developed respiratory failure requiring ICU admission and ventilator support.  Subsequently developing acute kidney injury requiring CRRT.  He then had multiple bilateral strokes, neurology was consulted.  Required tracheostomy.  ICU Significant Events: 8/3 Admit to neurosurgery, underwent  anterior lateral Interbody fusion of L1-L5, notably had difficult foley insertion requiring urethral dilation 8/4 Stage 2 posterior decompression and fusion, left intubated post-procedure and PCCM consulted 8/5 AKI seen on am labs with evidence of volume depletion on POC [place] 8/6: Patient AKI was due to acute rhabdomyolysis in the setting of prolonged surgery, he remained oliguric.  Ultrasound lower extremity showed acute right lower extremity DVT and left IJ DVT, IVC filter placed 8/7: Patient serum creatinine continues to trend up, he remained oliguric, started on CRRT Head CT showed small multiple bilateral strokes.  EEG was placed  8/9>. No seizures per EEG, remains on Levo, Propofol and fentanyl with good vent synchrony. Remains on CVVH with UF goal of 50 cc per hour, but increasing pressor needs this am  8/11: off pressors, mental status precluding extubation 8/12: Tunneled HD line placement 8/13: Tolerating some PSV on fentanyl 8/13 MRI/A brain > multiple acute/subacute CVA in the cerebellum, left > right thalami, bilateral cerebral hemispheres.  No abnormal findings in the neck, normal angiography 8/14 Began HD 8/15 Extubated and declined to due to  stridor and required reintubation.  8/16 S/p tracheostomy. Hgb 6.7 transfused 1u pRBC likely from actively draining serosanguinous fluid from surgical site on back. NSGY expressed fluid from surgical site until resolution. 8/17 UOP improving  Consultants:  PCCM Nephrology Neurology Urology General medicine   Procedures:  ANTERIOR LATERAL INTERBODY FUSION L1-L5 (12/12/2020) CYSTOSCOPY/URETHRAL DILATION/FOLEY INSERTION (12/12/2020) THORACIC - ILIAC FUSION; L5-S1 TRANSFORAMINAL LUMBAR INTERBODY FUSION (12/13/2020) INTUBATION/MECHANICAL VENTILATION (8/4-8/15) IVC FILTER PLACEMENT (12/16/2020) EEG (12/16/2020)  Assessment/Plan:   Acute respiratory failure with hypoxia and hypercapnia with postoperative respiratory failure: Patient initial intubation was on 12/13/2020 extubated on 12/24/2020. Unfortunately developed stridor requiring reintubation that same day. Subsequent tracheostomy on 12/26/2018 to wean off ventilator. Trach decannulation on 12/29/2020 secondary to malposition of tracheostomy.  Fever of unclear source: Blood cultures were ordered remain negative till date. Last fever was on 12/30/2020. Continue to monitor fever curve closely.  Hypernatremia: Free water flushes increased and his sodium is coming down this morning is 149 continue current regimen.  Acute metabolic encephalopathy: Now resolved.  ATN likely due to rhabdomyolysis: Nephrology was consulted and recommended CRRT. Currently urine output is improving over the last 24 hours further management per nephrology.  Spinal stenosis status post thoracolumbar fusion/decompression: Continue management per primary team.  Acute left IJ DVT/acute right upper extremity DVT Initially the patient was started on IV heparin infusion neurosurgery recommended stopping heparin considering the high drainage through output. IVC filter was placed on 12/16/2020.  Acute bilateral cortical MCA/ACA watershed infarcts: Neurology was consulted  and they were concerned for.  Perative hypotension in the setting of intracranial stenosis but cannot rule out cardioembolic stroke. MRI/MRA of the head and neck was negative.  Bubble studies was negative for PFO. Transthoracic echocardiogram showed  no thrombus. Neurology recommended Eliquis upon discharge.  Acute on chronic anemia/perioperative blood loss: Status post 5 units of packed red blood cells as hemoglobin has remained stable.  Seizure disorder: Continue Keppra no changes made.  Nutrition: In setting of critical illness and recent intubation. Core track and tube feedings started.  We will try to do feeding tubes at night and encourage him to eat this morning.  Hopefully we can discontinue the core track over the next few days. Speech was consulted who recommended to continue dysphagia 3 diet.  Chest hematoma: Continue IV heparin.  Pressure injury of the left heel: Unsure wounds present on admission.  RN Pressure Injury Documentation: Pressure Injury 12/18/20 Heel Left Deep Tissue Pressure Injury - Purple or maroon localized area of discolored intact skin or blood-filled blister due to damage of underlying soft tissue from pressure and/or shear. (Active)  12/18/20 0800  Location: Heel  Location Orientation: Left  Staging: Deep Tissue Pressure Injury - Purple or maroon localized area of discolored intact skin or blood-filled blister due to damage of underlying soft tissue from pressure and/or shear.  Wound Description (Comments):   Present on Admission:     Estimated body mass index is 33.13 kg/m as calculated from the following:   Height as of this encounter: 6' (1.829 m).   Weight as of this encounter: 110.8 kg.  DVT prophylaxis: heparin Family Communication:wife Status is: Inpatient  Remains inpatient appropriate because:Hemodynamically unstable  Dispo: The patient is from: Home              Anticipated d/c is to: Home              Patient currently is not  medically stable to d/c.   Difficult to place patient No Code Status:     Code Status Orders  (From admission, onward)           Start     Ordered   12/12/20 1901  Full code  Continuous        12/12/20 1900           Code Status History     Date Active Date Inactive Code Status Order ID Comments User Context   09/08/2020 1543 09/14/2020 2219 Full Code [id number]  Doran Heater, DO ED   04/23/2019 2054 04/25/2019 1744 Full Code [id number]  Rolm Bookbinder, MD ED   11/26/2013 0334 11/28/2013 1927 Full Code [id number]  Berle Mull, MD ED   11/15/2012 0201 11/16/2012 1810 Full Code [id number]  Derrill Kay, MD Inpatient      Advance Directive Documentation    Flowsheet Row Most Recent Value  Type of Advance Directive Healthcare Power of Attorney  Pre-existing out of facility DNR order (yellow form or pink MOST form) --  "MOST" Form in Place? --         IV Access:   Peripheral IV   Procedures and diagnostic studies:   IR Removal Tun Cv Cath W/O FL  Result Date: 12/31/2020 INDICATION: Resolved AKI, no longer needs access for dialysis. Request for tunneled dialysis catheter removal. EXAM: REMOVAL OF TUNNELED HEMODIALYSIS CATHETER MEDICATIONS: 10 mL 1 % lidocaine COMPLICATIONS: None immediate. PROCEDURE: Informed written consent was obtained from the patient following an explanation of the procedure, risks, benefits and alternatives to treatment. A time out was performed prior to the initiation of the procedure. Sterile technique was utilized including mask, sterile gloves, sterile drape, and hand hygiene. ChloraPrep was used to prep the patient's right neck, chest and existing  catheter. 1% lidocaine was injected around the catheter and the subcutaneous tunnel. The catheter was dissected out using scissors and curved hemostats until the cuff was freed from the surrounding fibrous sheath. The catheter was removed intact. Hemostasis was obtained with manual compression. A  dressing was placed. The patient tolerated the procedure well without immediate post procedural complication. IMPRESSION: Successful removal of tunneled dialysis catheter. Read by: Durenda Guthrie, PA-C Electronically Signed   By: Ruthann Cancer M.D.   On: 12/31/2020 14:50     Medical Consultants:   None.   Subjective:    Keith Dixon no complaints  Objective:    Vitals:   01/01/21 2331 01/02/21 0332 01/02/21 0356 01/02/21 0813  BP: (!) 163/80 (!) 159/84  137/75  Pulse: 82 80  79  Resp: '15 17  19  '$ Temp: 98.7 F (37.1 C) 97.7 F (36.5 C)  97.8 F (36.6 C)  TempSrc: Oral Oral  Oral  SpO2: 98% 98%  98%  Weight:   110.8 kg   Height:       SpO2: 98 % O2 Flow Rate (L/min): 2 L/min FiO2 (%): 21 %   Intake/Output Summary (Last 24 hours) at 01/02/2021 0936 Last data filed at 01/02/2021 0420 Gross per 24 hour  Intake 1581.09 ml  Output 3050 ml  Net -1468.91 ml   Filed Weights   12/26/20 0956 12/28/20 0500 01/02/21 0356  Weight: 109.8 kg 108 kg 110.8 kg    Exam: General exam: In no acute distress. Respiratory system: Good air movement and clear to auscultation. Cardiovascular system: S1 & S2 heard, RRR. No JVD, murmurs, rubs, gallops or clicks.  Gastrointestinal system: Abdomen is nondistended, soft and nontender.  Extremities: No pedal edema. Skin: No rashes, lesions or ulcers   Data Reviewed:    Labs: Basic Metabolic Panel: Recent Labs  Lab 12/29/20 0440 12/30/20 0334 12/31/20 0348 01/01/21 0407 01/02/21 0643  NA 149* 150* 151* 152* 149*  K 3.7 4.2 4.0 4.0 3.9  CL 113* 116* 119* 119* 117*  CO2 '26 24 25 25 24  '$ GLUCOSE 131* 110* 128* 139* 114*  BUN 91* 87* 75* 62* 47*  CREATININE 3.62* 3.25* 2.68* 2.32* 1.72*  CALCIUM 9.0 8.6* 8.5* 8.7* 8.5*  MG 2.2 1.8 1.8 1.7 1.6*  PHOS 4.8* 4.2 3.6 3.9 3.9   GFR Estimated Creatinine Clearance: 60.2 mL/min (A) (by C-G formula based on SCr of 1.72 mg/dL (H)). Liver Function Tests: Recent Labs  Lab 12/29/20 0440  12/30/20 0334 12/31/20 0348 01/01/21 0407 01/02/21 0643  ALBUMIN 2.2* 2.2* 2.1* 2.2* 2.2*   No results for input(s): LIPASE, AMYLASE in the last 168 hours. No results for input(s): AMMONIA in the last 168 hours. Coagulation profile No results for input(s): INR, PROTIME in the last 168 hours. COVID-19 Labs  No results for input(s): DDIMER, FERRITIN, LDH, CRP in the last 72 hours.  Lab Results  Component Value Date   SARSCOV2NAA NEGATIVE 12/12/2020   SARSCOV2NAA RESULT: NEGATIVE 12/10/2020   [place] NEGATIVE 09/08/2020   [place] NEGATIVE 04/23/2019    CBC: Recent Labs  Lab 12/27/20 0234 12/28/20 0518 12/29/20 0440 12/30/20 0334 01/01/21 0407  WBC 14.0* 16.5* 13.6* 15.1* 12.6*  HGB 8.1* 8.7* 8.0* 7.9* 7.5*  HCT 25.4* 26.7* 24.8* 25.3* 24.5*  MCV 84.9 85.0 85.5 88.2 88.8  PLT 311 378 430* 453* 353   Cardiac Enzymes: No results for input(s): CKTOTAL, CKMB, CKMBINDEX, TROPONINI in the last 168 hours. BNP (last 3 results) No results for input(s): PROBNP in  the last 8760 hours. CBG: Recent Labs  Lab 01/01/21 1524 01/01/21 2043 01/01/21 2330 01/02/21 0340 01/02/21 0811  GLUCAP 98 112* 107* 107* 107*   D-Dimer: No results for input(s): DDIMER in the last 72 hours. Hgb A1c: No results for input(s): HGBA1C in the last 72 hours. Lipid Profile: No results for input(s): CHOL, HDL, LDLCALC, TRIG, CHOLHDL, LDLDIRECT in the last 72 hours. Thyroid function studies: No results for input(s): TSH, T4TOTAL, T3FREE, THYROIDAB in the last 72 hours.  Invalid input(s): FREET3 Anemia work up: No results for input(s): VITAMINB12, FOLATE, FERRITIN, TIBC, IRON, RETICCTPCT in the last 72 hours. Sepsis Labs: Recent Labs  Lab 12/28/20 0518 12/29/20 0440 12/30/20 0334 01/01/21 0407  WBC 16.5* 13.6* 15.1* 12.6*   Microbiology Recent Results (from the past 240 hour(s))  Culture, blood (routine x 2)     Status: None (Preliminary result)   Collection Time: 12/30/20  9:24  AM   Specimen: BLOOD  Result Value Ref Range Status   Specimen Description BLOOD BLOOD LEFT HAND  Final   Special Requests AEROBIC BOTTLE ONLY Blood Culture adequate volume  Final   Culture   Final    NO GROWTH 3 DAYS Performed at [facility], [place] [address], [place], [place] [address]    Report Status PENDING  Incomplete  Culture, blood (routine x 2)     Status: None (Preliminary result)   Collection Time: 12/30/20  9:27 AM   Specimen: BLOOD  Result Value Ref Range Status   Specimen Description BLOOD BLOOD RIGHT HAND  Final   Special Requests AEROBIC BOTTLE ONLY Blood Culture adequate volume  Final   Culture   Final    NO GROWTH 3 DAYS Performed at [facility], [place] [address] [place], [place] [address]    Report Status PENDING  Incomplete     Medications:    alteplase  2 mg Intracatheter Once   amLODipine  2.5 mg Per Tube Daily   aspirin  81 mg Per Tube Daily   bacitracin   Topical Daily   chlorhexidine  15 mL Mouth Rinse BID   Chlorhexidine Gluconate Cloth  6 each Topical Daily   cholecalciferol  2,000 Units Per Tube BID   ciprofloxacin  1 drop Left Eye Q4H while awake   feeding supplement (NEPRO CARB STEADY)  1,000 mL Per Tube Q24H   feeding supplement (NEPRO CARB STEADY)  237 mL Oral BID BM   feeding supplement (PROSource TF)  45 mL Per Tube TID   folic acid  1 mg Per Tube Daily   free water  400 mL Per Tube Q2H   gabapentin  300 mg Per Tube Daily   lamoTRIgine  100 mg Per Tube BID   levETIRAcetam  750 mg Per Tube BID   mouth rinse  15 mL Mouth Rinse q12n4p   pantoprazole sodium  40 mg Per Tube QHS   QUEtiapine  200 mg Per Tube QHS   sertraline  100 mg Per Tube QPC breakfast   sodium chloride flush  10-40 mL Intracatheter Q12H   sodium chloride flush  3 mL Intravenous Q12H   thiamine injection  100 mg Intravenous Daily   vitamin B-12  1,000 mcg Per Tube Daily   zinc sulfate  220 mg Per Tube Daily   Continuous Infusions:  sodium chloride Stopped  (12/25/20 1749)   heparin 2,300 Units/hr (01/02/21 0752)      LOS: 21 days   Charlynne Cousins  [facility]  01/02/2021, 9:36 AM

## 2021-01-02 NOTE — Progress Notes (Signed)
Subjective: Remains afebrile.  NAEs o/n  Objective: Vital signs in last 24 hours: Temp:  [97.7 F (36.5 C)-98.8 F (37.1 C)] 97.8 F (36.6 C) (08/24 0813) Pulse Rate:  [79-87] 79 (08/24 0813) Resp:  [15-20] 19 (08/24 0813) BP: (137-166)/(70-91) 137/75 (08/24 0813) SpO2:  [8 %-98 %] 98 % (08/24 0813) Weight:  [110.8 kg] 110.8 kg (08/24 0356)  Intake/Output from previous day: 08/23 0701 - 08/24 0700 In: 1981.1 [P.O.:1493; I.V.:488.1] Out: 3050 [Urine:3050] Intake/Output this shift: Total I/O In: -  Out: 1 [Stool:1]  Phonating better, FC x 4, 3/5 strength Ues and Les with generalized deconditioning.  Prevena removed at bedside, replaced with Xeroform gauze and abd pads.  Incision without redness, tenderness, drainage or erythema.  Left upper paraspinous blood blister present, dressed with gauze  Lab Results: Recent Labs    01/01/21 0407  WBC 12.6*  HGB 7.5*  HCT 24.5*  PLT 353   BMET Recent Labs    01/01/21 0407 01/02/21 0643  NA 152* 149*  K 4.0 3.9  CL 119* 117*  CO2 25 24  GLUCOSE 139* 114*  BUN 62* 47*  CREATININE 2.32* 1.72*  CALCIUM 8.7* 8.5*    Studies/Results: IR Removal Tun Cv Cath W/O FL  Result Date: 12/31/2020 INDICATION: Resolved AKI, no longer needs access for dialysis. Request for tunneled dialysis catheter removal. EXAM: REMOVAL OF TUNNELED HEMODIALYSIS CATHETER MEDICATIONS: 10 mL 1 % lidocaine COMPLICATIONS: None immediate. PROCEDURE: Informed written consent was obtained from the patient following an explanation of the procedure, risks, benefits and alternatives to treatment. A time out was performed prior to the initiation of the procedure. Sterile technique was utilized including mask, sterile gloves, sterile drape, and hand hygiene. ChloraPrep was used to prep the patient's right neck, chest and existing catheter. 1% lidocaine was injected around the catheter and the subcutaneous tunnel. The catheter was dissected out using scissors and curved  hemostats until the cuff was freed from the surrounding fibrous sheath. The catheter was removed intact. Hemostasis was obtained with manual compression. A dressing was placed. The patient tolerated the procedure well without immediate post procedural complication. IMPRESSION: Successful removal of tunneled dialysis catheter. Read by: Durenda Guthrie, PA-C Electronically Signed   By: Ruthann Cancer M.D.   On: 12/31/2020 14:50    Assessment/Plan: S/p T10-iliac reconstruction - will plan to remove staples Thurs or Friday.  Can likely be switched from heparin gtt to Eliquis this coming Monday. - current dispo plan is SNF, though he seems to be a good candidate for inpatient rehab.   Vallarie Mare 01/02/2021, 11:21 AM

## 2021-01-02 NOTE — Progress Notes (Signed)
[place] for IV Heparin Indication: DVT  No Known Allergies  Patient Measurements: Height: 6' (182.9 cm) Weight: 110.8 kg (244 lb 4.3 oz) IBW/kg (Calculated) : 77.6 Heparin Dosing Weight: 99 kg   Vital Signs: Temp: 97.7 F (36.5 C) (08/24 0332) Temp Source: Oral (08/24 0332) BP: 159/84 (08/24 0332) Pulse Rate: 80 (08/24 0332)  Labs: Recent Labs    12/31/20 0348 12/31/20 0522 12/31/20 1408 01/01/21 0407 01/02/21 0643  HGB  --   --   --  7.5*  --   HCT  --   --   --  24.5*  --   PLT  --   --   --  353  --   APTT 106*   < > 68* 66* 80*  HEPARINUNFRC  --   --  0.46  --   --   CREATININE 2.68*  --   --  2.32* 1.72*   < > = values in this interval not displayed.    Estimated Creatinine Clearance: 60.2 mL/min (A) (by C-G formula based on SCr of 1.72 mg/dL (H)).  Assessment: 60 yr old male with RLE DVT, S/P recent lumbar fusion. Patient continues on IV heparin and was transitioned to [facility] per nephrology on 8/14. AKI now resolving with no further HD indicated per Nephrology.  aPTT slightly high this morning at 80. Hg drifting down to 7.5, plt wnl (CBC still pending for today). No bleeding or issues with infusion per discussion with RN.  Goal of Therapy:  aPTT 50-75 seconds per neurosurgery Monitor platelets by anticoagulation protocol: Yes   Plan:  Decrease heparin IV to 2300 units/hr Check 8h aPTT Monitor daily CBC, s/s bleeding F/u ability to transition to apixaban when Neurosurgery approves   Arturo Morton, PharmD, BCPS Please check AMION for all [place] contact numbers Clinical Pharmacist 01/02/2021 7:41 AM

## 2021-01-02 NOTE — Progress Notes (Signed)
[facility] NEPHROLOGY PROGRESS NOTE  Assessment/ Plan:  # Resolving AKI, prev HD dep't.  No longer requiring dialysis Etiology from a combination of ATN and rhabdomyolysis/pigment nephropathy.   Last dialysis was on 8/17.   Great UOP and downtrending SCr, no furthe HD indicated TDC removed 8/22 Daily weights, Daily Renal Panel, Strict I/Os, Avoid nephrotoxins (NSAIDs, judicious IV Contrast)   #Mild hypernatremia driven by solute diuresis with recovering ATN and impaired access to free water with tracheostomy.  Continue free water flushes, titrate towards normal serum sodium, ad lib. fluids as clinically able  #  Acute postoperative hypoxic/hypercapnic respiratory failure: Decannulated 8/21  #  Lumbar spine stenosis/scoliosis status post decompression and fusion surgery: Ongoing management/surveillance by neurosurgery.  # Shock: resolved and he is currently hypertensive. On amlodipine.   # Acute blood loss anemia: Secondary to surgical associated losses/critical illness, transfusion per primary team.  # Acute multifocal bilateral CVA: Suggestive of embolic disease, supportive management.  Will sign off for now.  Please call with any questions or concerns.  Pt does not need follow up with nephrology unless persistent impairment in GFR or difficulty managing hyponatremia.   Subjective:  Daughter at bedside, updated Creatinine further improved 1.7, serum sodium improved to 149 3.1 L urine output, and 3 unmeasured voids yesterday  Objective Vital signs in last 24 hours: Vitals:   01/01/21 2331 01/02/21 0332 01/02/21 0356 01/02/21 0813  BP: (!) 163/80 (!) 159/84  137/75  Pulse: 82 80  79  Resp: '15 17  19  '$ Temp: 98.7 F (37.1 C) 97.7 F (36.5 C)  97.8 F (36.6 C)  TempSrc: Oral Oral  Oral  SpO2: 98% 98%  98%  Weight:   110.8 kg   Height:       Weight change:   Intake/Output Summary (Last 24 hours) at 01/02/2021 1229 Last data filed at 01/02/2021 0949 Gross  per 24 hour  Intake 1218.09 ml  Output 2551 ml  Net -1332.91 ml        Labs: Basic Metabolic Panel: Recent Labs  Lab 12/31/20 0348 01/01/21 0407 01/02/21 0643  NA 151* 152* 149*  K 4.0 4.0 3.9  CL 119* 119* 117*  CO2 '25 25 24  '$ GLUCOSE 128* 139* 114*  BUN 75* 62* 47*  CREATININE 2.68* 2.32* 1.72*  CALCIUM 8.5* 8.7* 8.5*  PHOS 3.6 3.9 3.9    Liver Function Tests: Recent Labs  Lab 12/31/20 0348 01/01/21 0407 01/02/21 0643  ALBUMIN 2.1* 2.2* 2.2*    No results for input(s): LIPASE, AMYLASE in the last 168 hours. No results for input(s): AMMONIA in the last 168 hours. CBC: Recent Labs  Lab 12/27/20 0234 12/28/20 0518 12/29/20 0440 12/30/20 0334 01/01/21 0407  WBC 14.0* 16.5* 13.6* 15.1* 12.6*  HGB 8.1* 8.7* 8.0* 7.9* 7.5*  HCT 25.4* 26.7* 24.8* 25.3* 24.5*  MCV 84.9 85.0 85.5 88.2 88.8  PLT 311 378 430* 453* 353    Cardiac Enzymes: No results for input(s): CKTOTAL, CKMB, CKMBINDEX, TROPONINI in the last 168 hours. CBG: Recent Labs  Lab 01/01/21 1524 01/01/21 2043 01/01/21 2330 01/02/21 0340 01/02/21 0811  GLUCAP 98 112* 107* 107* 107*     Iron Studies: No results for input(s): IRON, TIBC, TRANSFERRIN, FERRITIN in the last 72 hours. Studies/Results: IR Removal Tun Cv Cath W/O FL  Result Date: 12/31/2020 INDICATION: Resolved AKI, no longer needs access for dialysis. Request for tunneled dialysis catheter removal. EXAM: REMOVAL OF TUNNELED HEMODIALYSIS CATHETER MEDICATIONS: 10 mL 1 % lidocaine COMPLICATIONS: None  immediate. PROCEDURE: Informed written consent was obtained from the patient following an explanation of the procedure, risks, benefits and alternatives to treatment. A time out was performed prior to the initiation of the procedure. Sterile technique was utilized including mask, sterile gloves, sterile drape, and hand hygiene. ChloraPrep was used to prep the patient's right neck, chest and existing catheter. 1% lidocaine was injected around  the catheter and the subcutaneous tunnel. The catheter was dissected out using scissors and curved hemostats until the cuff was freed from the surrounding fibrous sheath. The catheter was removed intact. Hemostasis was obtained with manual compression. A dressing was placed. The patient tolerated the procedure well without immediate post procedural complication. IMPRESSION: Successful removal of tunneled dialysis catheter. Read by: Durenda Guthrie, PA-C Electronically Signed   By: Ruthann Cancer M.D.   On: 12/31/2020 14:50    Medications: Infusions:  sodium chloride Stopped (12/25/20 1749)   heparin 2,300 Units/hr (01/02/21 0752)    Scheduled Medications:  alteplase  2 mg Intracatheter Once   amLODipine  2.5 mg Per Tube Daily   aspirin  81 mg Per Tube Daily   bacitracin   Topical Daily   chlorhexidine  15 mL Mouth Rinse BID   Chlorhexidine Gluconate Cloth  6 each Topical Daily   cholecalciferol  2,000 Units Per Tube BID   ciprofloxacin  1 drop Left Eye Q4H while awake   feeding supplement (NEPRO CARB STEADY)  1,000 mL Per Tube Q24H   feeding supplement (NEPRO CARB STEADY)  237 mL Oral BID BM   feeding supplement (PROSource TF)  45 mL Per Tube TID   folic acid  1 mg Per Tube Daily   free water  400 mL Per Tube Q2H   gabapentin  300 mg Per Tube Daily   lamoTRIgine  100 mg Per Tube BID   levETIRAcetam  750 mg Per Tube BID   mouth rinse  15 mL Mouth Rinse q12n4p   pantoprazole sodium  40 mg Per Tube QHS   QUEtiapine  200 mg Per Tube QHS   sertraline  100 mg Per Tube QPC breakfast   sodium chloride flush  10-40 mL Intracatheter Q12H   sodium chloride flush  3 mL Intravenous Q12H   thiamine injection  100 mg Intravenous Daily   vitamin B-12  1,000 mcg Per Tube Daily   zinc sulfate  220 mg Per Tube Daily    have reviewed scheduled and prn medications.  Physical Exam: General: Comfortable, . Heart:RRR, s1s2 nl Lungs: Coarse breath sound anteriorly. Abdomen:soft, Non-tender,  non-distended Extremities:No edema  Rexene Agent 01/02/2021,12:29 PM  LOS: 21 days

## 2021-01-02 NOTE — Progress Notes (Addendum)
Center for IV Heparin Indication: DVT  No Known Allergies  Patient Measurements: Height: 6' (182.9 cm) Weight: 110.8 kg (244 lb 4.3 oz) IBW/kg (Calculated) : 77.6 Heparin Dosing Weight: 99 kg  Vital Signs: Temp: 98.1 F (36.7 C) (08/24 1255) Temp Source: Oral (08/24 1255) BP: 151/82 (08/24 1255) Pulse Rate: 78 (08/24 1255)  Labs: Recent Labs    12/31/20 0348 12/31/20 0522 12/31/20 1408 01/01/21 0407 01/02/21 0643 01/02/21 1627  HGB  --   --   --  7.5*  --   --   HCT  --   --   --  24.5*  --   --   PLT  --   --   --  353  --   --   APTT 106*   < > 68* 66* 80* 63*  HEPARINUNFRC  --   --  0.46  --   --   --   CREATININE 2.68*  --   --  2.32* 1.72*  --    < > = values in this interval not displayed.    Estimated Creatinine Clearance: 60.2 mL/min (A) (by C-G formula based on SCr of 1.72 mg/dL (H)).  Assessment: 60 yr old male with RLE DVT, S/P recent lumbar fusion. Patient continues on IV heparin and was transitioned to [facility] per nephrology on 8/14. AKI now resolving with no further HD indicated per Nephrology. IVC filter placed on 12/16/20.   aPTT ~8.5 hrs after heparin infusion was decreased to 2300 units/hr was 63 sec, which is within the goal range for this pt. Hgb drifting down to 7.5, plt wnl (CBC not done today, will order now). No bleeding or issues with infusion per discussion with RN.  Goal of Therapy:  aPTT 50-75 seconds per neurosurgery Monitor platelets by anticoagulation protocol: Yes   Plan:  Continue heparin infusion at 2300 units/hr Check confirmatory aPTT in 8 hrs F/U CBC ordered this evening Monitor daily CBC, s/s bleeding F/U ability to transition to apixaban when Neurosurgery approves (possibly next Monday, per surgeon note)  Gillermina Hu, PharmD, BCPS, [facility] Clinical Pharmacist 01/02/2021 5:32 PM   ADDENDUM CBC this PM: H/H 7.8/25.1, plt 301 (CBC stable)

## 2021-01-03 LAB — RENAL FUNCTION PANEL
Albumin: 2.2 g/dL — ABNORMAL LOW (ref 3.5–5.0)
Anion gap: 7 (ref 5–15)
BUN: 40 mg/dL — ABNORMAL HIGH (ref 6–20)
CO2: 23 mmol/L (ref 22–32)
Calcium: 8.4 mg/dL — ABNORMAL LOW (ref 8.9–10.3)
Chloride: 112 mmol/L — ABNORMAL HIGH (ref 98–111)
Creatinine, Ser: 1.47 mg/dL — ABNORMAL HIGH (ref 0.61–1.24)
GFR, Estimated: 55 mL/min — ABNORMAL LOW (ref >60)
Glucose, Bld: 113 mg/dL — ABNORMAL HIGH (ref 70–99)
Phosphorus: 4.2 mg/dL (ref 2.5–4.6)
Potassium: 3.6 mmol/L (ref 3.5–5.1)
Sodium: 142 mmol/L (ref 135–145)

## 2021-01-03 LAB — APTT
aPTT: 40 seconds — ABNORMAL HIGH (ref 24–36)
aPTT: 66 seconds — ABNORMAL HIGH (ref 24–36)

## 2021-01-03 LAB — GLUCOSE, CAPILLARY
Glucose-Capillary: 105 mg/dL — ABNORMAL HIGH (ref 70–99)
Glucose-Capillary: 108 mg/dL — ABNORMAL HIGH (ref 70–99)
Glucose-Capillary: 108 mg/dL — ABNORMAL HIGH (ref 70–99)
Glucose-Capillary: 110 mg/dL — ABNORMAL HIGH (ref 70–99)
Glucose-Capillary: 91 mg/dL (ref 70–99)
Glucose-Capillary: 92 mg/dL (ref 70–99)
Glucose-Capillary: 96 mg/dL (ref 70–99)

## 2021-01-03 LAB — MAGNESIUM: Magnesium: 1.5 mg/dL — ABNORMAL LOW (ref 1.7–2.4)

## 2021-01-03 NOTE — Progress Notes (Signed)
[facility] CONSULT PROGRESS NOTE    Progress Note  TRAVARIOUS PANGELINAN  [id number] DOB: 15-Nov-1960 DOA: 12/12/2020 PCP: Reubin Milan, MD     Brief Narrative:   Keith Dixon is an 60 y.o. male past medical history of essential hypertension, PTSD, seizure and bipolar disorder comes in for worsening leg pain and cauda equina symptoms.  Neurosurgery was admitted the patient perform thoracolumbar decompression and fusion postop the patient developed respiratory failure requiring ICU admission and ventilator support.  Subsequently developing acute kidney injury requiring CRRT.  He then had multiple bilateral strokes, neurology was consulted.  Required tracheostomy.  ICU Significant Events: 8/3 Admit to neurosurgery, underwent  anterior lateral Interbody fusion of L1-L5, notably had difficult foley insertion requiring urethral dilation 8/4 Stage 2 posterior decompression and fusion, left intubated post-procedure and PCCM consulted 8/5 AKI seen on am labs with evidence of volume depletion on POC [place] 8/6: Patient AKI was due to acute rhabdomyolysis in the setting of prolonged surgery, he remained oliguric.  Ultrasound lower extremity showed acute right lower extremity DVT and left IJ DVT, IVC filter placed 8/7: Patient serum creatinine continues to trend up, he remained oliguric, started on CRRT Head CT showed small multiple bilateral strokes.  EEG was placed  8/9>. No seizures per EEG, remains on Levo, Propofol and fentanyl with good vent synchrony. Remains on CVVH with UF goal of 50 cc per hour, but increasing pressor needs this am  8/11: off pressors, mental status precluding extubation 8/12: Tunneled HD line placement 8/13: Tolerating some PSV on fentanyl 8/13 MRI/A brain > multiple acute/subacute CVA in the cerebellum, left > right thalami, bilateral cerebral hemispheres.  No abnormal findings in the neck, normal angiography 8/14 Began HD 8/15 Extubated and declined to due to  stridor and required reintubation.  8/16 S/p tracheostomy. Hgb 6.7 transfused 1u pRBC likely from actively draining serosanguinous fluid from surgical site on back. NSGY expressed fluid from surgical site until resolution. 8/17 UOP improving  Consultants:  PCCM Nephrology Neurology Urology General medicine   Procedures:  ANTERIOR LATERAL INTERBODY FUSION L1-L5 (12/12/2020) CYSTOSCOPY/URETHRAL DILATION/FOLEY INSERTION (12/12/2020) THORACIC - ILIAC FUSION; L5-S1 TRANSFORAMINAL LUMBAR INTERBODY FUSION (12/13/2020) INTUBATION/MECHANICAL VENTILATION (8/4-8/15) IVC FILTER PLACEMENT (12/16/2020) EEG (12/16/2020)  Assessment/Plan:   Acute respiratory failure with hypoxia and hypercapnia with postoperative respiratory failure: Patient initial intubation was on 12/13/2020 extubated on 12/24/2020. Unfortunately developed stridor requiring reintubation that same day. Subsequent tracheostomy on 12/26/2018 to wean off ventilator. Trach decannulation on 12/29/2020 secondary to malposition of tracheostomy. Neurosurgery recommended to switch from heparin drip to Eliquis on 01/07/2021. Physical therapy has evaluated the patient recommended skilled nursing facility.  Fever of unclear source: Blood cultures were ordered remain negative till date. Last fever was on 12/30/2020. Continue to monitor fever curve closely.  Hypovolemic hypernatremia: Resolved with free water flushes.  Acute metabolic encephalopathy: Now resolved.  ATN likely due to rhabdomyolysis: Likely multifactorial in the setting of ATN and pigment nephropathy Nephrology was consulted and recommended CRRT. Currently urine output is improving over the last 24 hours further management per nephrology. Had a candidate for HD anymore with great urine output. Temporary catheter removed on 12/31/2020. Nephrology has signed off.  Spinal stenosis status post thoracolumbar fusion/decompression: Continue management per primary team.  Acute left IJ  DVT/acute right upper extremity DVT Initially the patient was started on IV heparin infusion neurosurgery recommended stopping heparin considering the high drainage through output. IVC filter was placed on 12/16/2020.  Acute bilateral cortical MCA/ACA watershed infarcts: Neurology was  consulted and they were concerned for.  Perative hypotension in the setting of intracranial stenosis but cannot rule out cardioembolic stroke. MRI/MRA of the head and neck was negative.  Bubble studies was negative for PFO. Transthoracic echocardiogram showed no thrombus. Neurology recommended Eliquis upon discharge.  Continue IV heparin and can transition to oral Eliquis on 01/07/2021  Acute on chronic anemia/perioperative blood loss: Status post 5 units of packed red blood cells as hemoglobin has remained stable.  Seizure disorder: Continue Keppra no changes made.  Nutrition: In setting of critical illness and recent intubation. Core track and tube feedings started.  We will try to do feeding tubes at night and encourage him to eat this morning.  Hopefully we can discontinue the core track over the next few days. Speech was consulted who recommended to continue dysphagia 3 diet.   Pressure injury of the left heel: Unsure wounds present on admission.  RN Pressure Injury Documentation: Pressure Injury 12/18/20 Heel Left Deep Tissue Pressure Injury - Purple or maroon localized area of discolored intact skin or blood-filled blister due to damage of underlying soft tissue from pressure and/or shear. (Active)  12/18/20 0800  Location: Heel  Location Orientation: Left  Staging: Deep Tissue Pressure Injury - Purple or maroon localized area of discolored intact skin or blood-filled blister due to damage of underlying soft tissue from pressure and/or shear.  Wound Description (Comments):   Present on Admission:     Estimated body mass index is 33.61 kg/m as calculated from the following:   Height as of this  encounter: 6' (1.829 m).   Weight as of this encounter: 112.4 kg.  DVT prophylaxis: heparin Family Communication:wife Status is: Inpatient  Remains inpatient appropriate because:Hemodynamically unstable  Dispo: The patient is from: Home              Anticipated d/c is to: Home              Patient currently is not medically stable to d/c.   Difficult to place patient No Code Status:     Code Status Orders  (From admission, onward)           Start     Ordered   12/12/20 1901  Full code  Continuous        12/12/20 1900           Code Status History     Date Active Date Inactive Code Status Order ID Comments User Context   09/08/2020 1543 09/14/2020 2219 Full Code [id number]  Doran Heater, DO ED   04/23/2019 2054 04/25/2019 1744 Full Code [id number]  Rolm Bookbinder, MD ED   11/26/2013 0334 11/28/2013 1927 Full Code [id number]  Berle Mull, MD ED   11/15/2012 0201 11/16/2012 1810 Full Code [id number]  Derrill Kay, MD Inpatient      Advance Directive Documentation    Flowsheet Row Most Recent Value  Type of Advance Directive Healthcare Power of Attorney  Pre-existing out of facility DNR order (yellow form or pink MOST form) --  "MOST" Form in Place? --         IV Access:   Peripheral IV   Procedures and diagnostic studies:   No results found.   Medical Consultants:   None.   Subjective:    JAKYREE ERICKSEN no complaints  Objective:    Vitals:   01/03/21 0308 01/03/21 0500 01/03/21 0750 01/03/21 0823  BP: (!) 155/88  (!) 145/99 (!) 145/99  Pulse:   81 78  Resp: '16  19 12  '$ Temp: 98.3 F (36.8 C)  98.1 F (36.7 C) 97.7 F (36.5 C)  TempSrc: Oral  Oral Oral  SpO2:   97% 98%  Weight:  112.4 kg    Height:       SpO2: 98 % O2 Flow Rate (L/min): 2 L/min FiO2 (%): 21 %   Intake/Output Summary (Last 24 hours) at 01/03/2021 0911 Last data filed at 01/02/2021 0949 Gross per 24 hour  Intake --  Output 1 ml  Net -1 ml    Filed Weights    12/28/20 0500 01/02/21 0356 01/03/21 0500  Weight: 108 kg 110.8 kg 112.4 kg    Exam: General exam: In no acute distress. Respiratory system: Good air movement and clear to auscultation. Cardiovascular system: S1 & S2 heard, RRR. No JVD. Gastrointestinal system: Abdomen is nondistended, soft and nontender.  Extremities: No pedal edema. Skin: No rashes, lesions or ulcers  Data Reviewed:    Labs: Basic Metabolic Panel: Recent Labs  Lab 12/30/20 0334 12/31/20 0348 01/01/21 0407 01/02/21 0643 01/03/21 0214  NA 150* 151* 152* 149* 142  K 4.2 4.0 4.0 3.9 3.6  CL 116* 119* 119* 117* 112*  CO2 '24 25 25 24 23  '$ GLUCOSE 110* 128* 139* 114* 113*  BUN 87* 75* 62* 47* 40*  CREATININE 3.25* 2.68* 2.32* 1.72* 1.47*  CALCIUM 8.6* 8.5* 8.7* 8.5* 8.4*  MG 1.8 1.8 1.7 1.6* 1.5*  PHOS 4.2 3.6 3.9 3.9 4.2    GFR Estimated Creatinine Clearance: 70.9 mL/min (A) (by C-G formula based on SCr of 1.47 mg/dL (H)). Liver Function Tests: Recent Labs  Lab 12/30/20 0334 12/31/20 0348 01/01/21 0407 01/02/21 0643 01/03/21 0214  ALBUMIN 2.2* 2.1* 2.2* 2.2* 2.2*    No results for input(s): LIPASE, AMYLASE in the last 168 hours. No results for input(s): AMMONIA in the last 168 hours. Coagulation profile No results for input(s): INR, PROTIME in the last 168 hours. COVID-19 Labs  No results for input(s): DDIMER, FERRITIN, LDH, CRP in the last 72 hours.  Lab Results  Component Value Date   SARSCOV2NAA NEGATIVE 12/12/2020   SARSCOV2NAA RESULT: NEGATIVE 12/10/2020   [place] NEGATIVE 09/08/2020   [place] NEGATIVE 04/23/2019    CBC: Recent Labs  Lab 12/28/20 0518 12/29/20 0440 12/30/20 0334 01/01/21 0407 01/02/21 1759  WBC 16.5* 13.6* 15.1* 12.6* 11.6*  HGB 8.7* 8.0* 7.9* 7.5* 7.8*  HCT 26.7* 24.8* 25.3* 24.5* 25.1*  MCV 85.0 85.5 88.2 88.8 88.7  PLT 378 430* 453* 353 301    Cardiac Enzymes: No results for input(s): CKTOTAL, CKMB, CKMBINDEX, TROPONINI in the last 168  hours. BNP (last 3 results) No results for input(s): PROBNP in the last 8760 hours. CBG: Recent Labs  Lab 01/02/21 1525 01/02/21 2041 01/03/21 0014 01/03/21 0411 01/03/21 0821  GLUCAP 97 102* 105* 108* 91    D-Dimer: No results for input(s): DDIMER in the last 72 hours. Hgb A1c: No results for input(s): HGBA1C in the last 72 hours. Lipid Profile: No results for input(s): CHOL, HDL, LDLCALC, TRIG, CHOLHDL, LDLDIRECT in the last 72 hours. Thyroid function studies: No results for input(s): TSH, T4TOTAL, T3FREE, THYROIDAB in the last 72 hours.  Invalid input(s): FREET3 Anemia work up: No results for input(s): VITAMINB12, FOLATE, FERRITIN, TIBC, IRON, RETICCTPCT in the last 72 hours. Sepsis Labs: Recent Labs  Lab 12/29/20 0440 12/30/20 0334 01/01/21 0407 01/02/21 1759  WBC 13.6* 15.1* 12.6* 11.6*    Microbiology Recent Results (from the past 240 hour(s))  Culture, blood (routine x 2)     Status: None (Preliminary result)   Collection Time: 12/30/20  9:24 AM   Specimen: BLOOD  Result Value Ref Range Status   Specimen Description BLOOD BLOOD LEFT HAND  Final   Special Requests AEROBIC BOTTLE ONLY Blood Culture adequate volume  Final   Culture   Final    NO GROWTH 4 DAYS Performed at [facility], [address], [place], [place] [address]    Report Status PENDING  Incomplete  Culture, blood (routine x 2)     Status: None (Preliminary result)   Collection Time: 12/30/20  9:27 AM   Specimen: BLOOD  Result Value Ref Range Status   Specimen Description BLOOD BLOOD RIGHT HAND  Final   Special Requests AEROBIC BOTTLE ONLY Blood Culture adequate volume  Final   Culture   Final    NO GROWTH 4 DAYS Performed at [facility], [place] [address] [place], [place] [address]    Report Status PENDING  Incomplete     Medications:    alteplase  2 mg Intracatheter Once   amLODipine  2.5 mg Per Tube Daily   aspirin  81 mg Per Tube Daily   bacitracin   Topical Daily    chlorhexidine  15 mL Mouth Rinse BID   Chlorhexidine Gluconate Cloth  6 each Topical Daily   cholecalciferol  2,000 Units Per Tube BID   ciprofloxacin  1 drop Left Eye Q4H while awake   feeding supplement (NEPRO CARB STEADY)  1,000 mL Per Tube Q24H   feeding supplement (NEPRO CARB STEADY)  237 mL Oral BID BM   feeding supplement (PROSource TF)  45 mL Per Tube TID   folic acid  1 mg Per Tube Daily   free water  400 mL Per Tube Q2H   gabapentin  300 mg Per Tube Daily   lamoTRIgine  100 mg Per Tube BID   levETIRAcetam  750 mg Per Tube BID   mouth rinse  15 mL Mouth Rinse q12n4p   pantoprazole sodium  40 mg Per Tube QHS   QUEtiapine  200 mg Per Tube QHS   sertraline  100 mg Per Tube QPC breakfast   sodium chloride flush  10-40 mL Intracatheter Q12H   sodium chloride flush  3 mL Intravenous Q12H   thiamine  100 mg Per Tube Daily   vitamin B-12  1,000 mcg Per Tube Daily   zinc sulfate  220 mg Per Tube Daily   Continuous Infusions:  sodium chloride Stopped (12/25/20 1749)   heparin 2,300 Units/hr (01/03/21 0835)      LOS: 22 days   [facility]  01/03/2021, 9:11 AM

## 2021-01-03 NOTE — Progress Notes (Signed)
Occupational Therapy Treatment Patient Details Name: Keith Dixon MRN: [id number] DOB: 1960-09-23 Today's Date: 01/03/2021    History of present illness Pt is a 60 y.o. male who presented 12/12/20 with back and R>L leg pain. S/p DLIFs L1-2 L2-3 L34 L4-5 and T10 pelvis decompression and fusion 8/3 and second surgery 8/4. ETT 8/4 - 8/15, re-intubated 8/15. Found to have R lower extremity DVT and left IJ DVT with IVC filter placed 8/6. AKI. S/p tracheostomy 8/16. 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 PMH: PTSD, OSA, HTN, tobacco use, bipolar, degenerative scoliosis, seizures   OT comments  Pt progressing steadily towards acute OT goals. Today pt, able to come to full standing position with mod A +2 to utilize Stedy to sit up in recliner. TLSO hard shell donned at bed level at start of session. Recommend nursing staff continue to utilize [place] for OOB/back to bed trasnfers for now. D/c plan as below.    Follow Up Recommendations  SNF (would greatly benefit from inpatient rehab if insurance would approve)   Equipment Recommendations  Other (comment) (defer to next venue)    Recommendations for Other Services      Precautions / Restrictions Precautions Precautions: Fall;Back Precaution Comments: spinal precautions, cortrak Required Braces or Orthoses: Spinal Brace Spinal Brace: Thoracolumbosacral orthotic;Applied in supine position Spinal Brace Comments: clamshell Restrictions Weight Bearing Restrictions: No       Mobility Bed Mobility Overal bed mobility: Needs Assistance Bed Mobility: Rolling;Supine to Sit Rolling: Max assist   Supine to sit: Total assist;+2 for physical assistance;+2 for safety/equipment;HOB elevated     General bed mobility comments: Rolling each direction in bed to donn clamshell brace, maxA, cuing pt to flex knees and reach UE to contralateral rail to pull. TAx2 to  manage trunk and legs supine > sit R EOB with HOB elevated performing hellicopter technique.    Transfers Overall transfer level: Needs assistance Equipment used: Ambulation equipment used Transfers: Sit to/from [employer] Sit to Stand: Mod assist;+2 physical assistance;+2 safety/equipment;From elevated surface   Squat pivot transfers: Total assist;+2 safety/equipment     General transfer comment: EOB elevated for pt to transfer sit to stand to stedy, cuing pt to pull up on bar with UEs, needing modAx2 to initiate power up to stand with improved initiation from pt noted after the half-way point. Sit to stand x1 additional rep from stedy flaps to sit in recliner, needing maxAx2 to control descent into low seat. TA to pivot on stedy bed > recliner.    Balance Overall balance assessment: Needs assistance Sitting-balance support: Single extremity supported;Bilateral upper extremity supported;Feet supported Sitting balance-Leahy Scale: Poor Sitting balance - Comments: Mod-maxA posteriorly for static sitting balance at EOB, cuing for hand placement on EOB and to lean anteriorly and to the R, min success. Once seated on stedy flaps, promoting an anterior pelvic tilt, pt was able to sustain static sitting balance with min guard-minA with UEs on stedy bar for support. Postural control: Posterior lean;Left lateral lean Standing balance support: Bilateral upper extremity supported Standing balance-Leahy Scale: Poor Standing balance comment: Bil UE support and modAx2 to stand in stedy x2 bouts of ~10 sec each.                           ADL either performed or assessed with clinical judgement   ADL Overall ADL's : Needs assistance/impaired  General ADL Comments: Pt completed bed mobility, sat EOB a few minutes then completed sit<>stand utilizing Stedy. Pt up in recliner at end of session     Vision        Perception     Praxis      Cognition Arousal/Alertness: Awake/alert Behavior During Therapy: Flat affect Overall Cognitive Status: Impaired/Different from baseline Area of Impairment: Memory;Attention;Following commands;Problem solving                   Current Attention Level: Sustained Memory: Decreased short-term memory;Decreased recall of precautions Following Commands: Follows one step commands inconsistently;Follows one step commands with increased time     Problem Solving: Slow processing;Decreased initiation;Difficulty sequencing;Requires verbal cues;Requires tactile cues General Comments: Internally distracted        Exercises     Shoulder Instructions       General Comments Pt reported feeling "funny" with upright mobility, DBP had increased to 110-120s and pt appeared to be getting lethargic with eyes rolling back on occasion (still communicating though) and noted sweating. Reclined pt and applied cold washcloth to forehead and notified RN. DBP reduced to 90s after resting reclined.    Pertinent Vitals/ Pain       Pain Assessment: Faces Faces Pain Scale: Hurts even more Pain Location: back Pain Descriptors / Indicators: Discomfort;Grimacing Pain Intervention(s): Monitored during session;Limited activity within patient's tolerance;Premedicated before session;Repositioned  Home Living                                          Prior Functioning/Environment              Frequency  Min 2X/week        Progress Toward Goals  OT Goals(current goals can now be found in the care plan section)  Progress towards OT goals: Progressing toward goals  Acute Rehab OT Goals Patient Stated Goal: to sit up OT Goal Formulation: Patient unable to participate in goal setting Time For Goal Achievement: 01/11/21 Potential to Achieve Goals: Good ADL Goals Pt Will Perform Grooming: with mod assist;sitting Additional ADL Goal #1: Pt will  complete bed mobility mod +2 (A) as precursor to adls. Additional ADL Goal #2: pt will tolerate eob sitting with mod (A) for static sitting balance Additional ADL Goal #3: pt will complete basic transfer total +2 mod (A)  Plan Discharge plan needs to be updated    Co-evaluation    PT/OT/SLP Co-Evaluation/Treatment: Yes Reason for Co-Treatment: Complexity of the patient's impairments (multi-system involvement);Necessary to address cognition/behavior during functional activity;For patient/therapist safety;To address functional/ADL transfers PT goals addressed during session: Mobility/safety with mobility;Balance;Proper use of DME OT goals addressed during session: ADL's and self-care;Proper use of Adaptive equipment and DME;Strengthening/ROM      AM-PAC OT "6 Clicks" Daily Activity     Outcome Measure   Help from another person eating meals?: A Lot Help from another person taking care of personal grooming?: A Lot Help from another person toileting, which includes using toliet, bedpan, or urinal?: A Lot Help from another person bathing (including washing, rinsing, drying)?: A Lot Help from another person to put on and taking off regular upper body clothing?: A Lot Help from another person to put on and taking off regular lower body clothing?: A Lot 6 Click Score: 12    End of Session    OT Visit Diagnosis: Unsteadiness on feet (R26.81);Muscle weakness (generalized) (  M62.81);Pain   Activity Tolerance Patient limited by fatigue;Patient limited by pain;Patient tolerated treatment well   Patient Left in chair;with call bell/phone within reach;with chair alarm set   Nurse Communication Mobility status;Precautions;Other (comment) (see general comments)        Time: [id number] OT Time Calculation (min): 35 min  Charges: OT General Charges $OT Visit: 1 Visit OT Treatments $Self Care/Home Management : 8-22 mins  Tyrone Schimke, OT Acute Rehabilitation Services Pager:  [phone] Office: [phone]    Hortencia Pilar 01/03/2021, 2:33 PM

## 2021-01-03 NOTE — Progress Notes (Signed)
Physical Therapy Treatment Patient Details Name: Keith Dixon MRN: [id number] DOB: [birth year] Today's Date: 01/03/2021    History of Present Illness Pt is a 60 y.o. male who presented 12/12/20 with back and R>L leg pain. S/p DLIFs L1-2 L2-3 L34 L4-5 and T10 pelvis decompression and fusion 8/3 and second surgery 8/4. ETT 8/4 - 8/15, re-intubated 8/15. Found to have R lower extremity DVT and left IJ DVT with IVC filter placed 8/6. AKI. S/p tracheostomy 8/16. 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 PMH: PTSD, OSA, HTN, tobacco use, bipolar, degenerative scoliosis, seizures    PT Comments    Focused session on progressing pt's OOB mobility through having pt come to stand from elevated EOB to the stedy. Improved success coming to stand from EOB vs recliner. Pt was able to transfer to stand from elevated surfaces with modAx2 today. He continues to display a strong posterior bias in sitting, but this reduces when he is prompted into an anterior pelvic tilt by sitting on the stedy flaps, progressing from needing mod-maxA at EOB to only min guard-minA in the stedy. Will continue to follow acutely. Current recommendations remain appropriate. He would be a great candidate for inpatient rehab if he could be approved for it.   Follow Up Recommendations  SNF (would greatly benefit from inpatient rehab if insurance would approve)     Equipment Recommendations  Wheelchair (measurements PT);Wheelchair cushion (measurements PT);Hospital bed;Rolling walker with 5" wheels (hoyer lift)    Recommendations for Other Services       Precautions / Restrictions Precautions Precautions: Fall;Back Precaution Comments: spinal precautions, cortrak Required Braces or Orthoses: Spinal Brace Spinal Brace: Thoracolumbosacral orthotic;Applied in supine position Spinal Brace Comments: clamshell Restrictions Weight Bearing Restrictions: No     Mobility  Bed Mobility Overal bed mobility: Needs Assistance Bed Mobility: Rolling;Supine to Sit Rolling: Max assist   Supine to sit: Total assist;+2 for physical assistance;+2 for safety/equipment;HOB elevated     General bed mobility comments: Rolling each direction in bed to donn clamshell brace, maxA, cuing pt to flex knees and reach UE to contralateral rail to pull. TAx2 to manage trunk and legs supine > sit R EOB with HOB elevated performing hellicopter technique.    Transfers Overall transfer level: Needs assistance Equipment used: Ambulation equipment used Transfers: Sit to/from [employer] Sit to Stand: Mod assist;+2 physical assistance;+2 safety/equipment;From elevated surface   Squat pivot transfers: Total assist;+2 safety/equipment     General transfer comment: EOB elevated for pt to transfer sit to stand to stedy, cuing pt to pull up on bar with UEs, needing modAx2 to initiate power up to stand with improved initiation from pt noted after the half-way point. Sit to stand x1 additional rep from stedy flaps to sit in recliner, needing maxAx2 to control descent into low seat. TA to pivot on stedy bed > recliner.  Ambulation/Gait             General Gait Details: Unable this date.   Stairs             Wheelchair Mobility    Modified Rankin (Stroke Patients Only) Modified Rankin (Stroke Patients Only) Pre-Morbid Rankin Score: Moderate disability Modified Rankin: Severe disability     Balance Overall balance assessment: Needs assistance Sitting-balance support: Single extremity supported;Bilateral upper extremity supported;Feet supported Sitting balance-Leahy Scale: Poor Sitting balance - Comments: Mod-maxA posteriorly for static sitting balance at EOB, cuing for hand placement on EOB and to  lean anteriorly and to the R, min success. Once seated on stedy flaps, promoting an anterior pelvic tilt, pt was able to sustain static sitting  balance with min guard-minA with UEs on stedy bar for support. Postural control: Posterior lean;Left lateral lean Standing balance support: Bilateral upper extremity supported Standing balance-Leahy Scale: Poor Standing balance comment: Bil UE support and modAx2 to stand in stedy x2 bouts of ~10 sec each.                            Cognition Arousal/Alertness: Awake/alert Behavior During Therapy: Flat affect Overall Cognitive Status: Impaired/Different from baseline Area of Impairment: Memory;Attention;Following commands;Problem solving                   Current Attention Level: Sustained Memory: Decreased short-term memory;Decreased recall of precautions Following Commands: Follows one step commands inconsistently;Follows one step commands with increased time     Problem Solving: Slow processing;Decreased initiation;Difficulty sequencing;Requires verbal cues;Requires tactile cues General Comments: Internally distracted by pain and feeling "funny", unable to identify what felt "funny" with upright mobility even when provided list of lightheaded, dizzy, etc      Exercises      General Comments General comments (skin integrity, edema, etc.): Pt reported feeling "funny" with upright mobility, DBP had increased to 110-120s and pt appeared to be getting lethargic with eyes rolling back on occasion (still communicating though) and noted sweating. Reclined pt and applied cold washcloth to forehead and notified RN. DBP reduced to 90s after resting reclined.      Pertinent Vitals/Pain Pain Assessment: Faces Faces Pain Scale: Hurts even more Pain Location: back Pain Descriptors / Indicators: Discomfort;Grimacing Pain Intervention(s): Monitored during session;Limited activity within patient's tolerance;Premedicated before session;Repositioned    Home Living                      Prior Function            PT Goals (current goals can now be found in the care  plan section) Acute Rehab PT Goals Patient Stated Goal: to sit up PT Goal Formulation: With patient Time For Goal Achievement: 01/10/21 Potential to Achieve Goals: Fair Progress towards PT goals: Progressing toward goals    Frequency    Min 5X/week      PT Plan Current plan remains appropriate    Co-evaluation PT/OT/SLP Co-Evaluation/Treatment: Yes Reason for Co-Treatment: Complexity of the patient's impairments (multi-system involvement);Necessary to address cognition/behavior during functional activity;For patient/therapist safety;To address functional/ADL transfers PT goals addressed during session: Mobility/safety with mobility;Balance;Proper use of DME OT goals addressed during session: ADL's and self-care;Strengthening/ROM;Proper use of Adaptive equipment and DME      AM-PAC PT "6 Clicks" Mobility   Outcome Measure  Help needed turning from your back to your side while in a flat bed without using bedrails?: A Lot Help needed moving from lying on your back to sitting on the side of a flat bed without using bedrails?: Total Help needed moving to and from a bed to a chair (including a wheelchair)?: Total Help needed standing up from a chair using your arms (e.g., wheelchair or bedside chair)?: Total Help needed to walk in hospital room?: Total Help needed climbing 3-5 steps with a railing? : Total 6 Click Score: 7    End of Session Equipment Utilized During Treatment: Back brace Activity Tolerance: Patient limited by fatigue Patient left: in chair;with call bell/phone within reach;with chair alarm set Nurse  Communication: Mobility status;Other (comment) (BP) PT Visit Diagnosis: Muscle weakness (generalized) (M62.81);Difficulty in walking, not elsewhere classified (R26.2);Other symptoms and signs involving the nervous system (R29.898);Hemiplegia and hemiparesis;Pain;Unsteadiness on feet (R26.81) Hemiplegia - Right/Left: Right Hemiplegia - dominant/non-dominant:  Dominant Hemiplegia - caused by: Cerebral infarction Pain - part of body:  (back)     Time: [id number] PT Time Calculation (min) (ACUTE ONLY): 37 min  Charges:  $Therapeutic Activity: 8-22 mins                     Moishe Spice, PT, DPT Acute Rehabilitation Services  Pager: [phone] Office: [place] 01/03/2021, 12:41 PM

## 2021-01-03 NOTE — Progress Notes (Addendum)
[place] for IV Heparin Indication: DVT  No Known Allergies  Patient Measurements: Height: 6' (182.9 cm) Weight: 112.4 kg (247 lb 12.8 oz) IBW/kg (Calculated) : 77.6 Heparin Dosing Weight: 99 kg  Vital Signs: Temp: 98.3 F (36.8 C) (08/25 0308) Temp Source: Oral (08/25 0308) BP: 155/88 (08/25 0308) Pulse Rate: 91 (08/24 2323)  Labs: Recent Labs    12/31/20 1408 01/01/21 0407 01/02/21 [id number] 01/02/21 1627 01/02/21 1759 01/03/21 0214 01/03/21 0526  HGB  --  7.5*  --   --  7.8*  --   --   HCT  --  24.5*  --   --  25.1*  --   --   PLT  --  353  --   --  301  --   --   APTT 68* 66* 80* 63*  --  40* 66*  HEPARINUNFRC 0.46  --   --   --   --   --   --   CREATININE  --  2.32* 1.72*  --   --  1.47*  --     Estimated Creatinine Clearance: 70.9 mL/min (A) (by C-G formula based on SCr of 1.47 mg/dL (H)).  Assessment: 60 yr old male with RLE DVT, S/P recent lumbar fusion. Patient continues on IV heparin and was transitioned to [facility] per nephrology on 8/14. AKI now resolving with no further HD indicated per Nephrology. IVC filter placed on 12/16/20.   aPTT ~8.5 hrs after heparin infusion was decreased to 2300 units/hr was 63 sec, which is within the goal range for this pt. Hgb drifting down to 7.5, plt wnl (CBC not done today, will order now). No bleeding or issues with infusion per discussion with RN.  8/25 AM update:  aPTT therapeutic x 2 (63,66----40 from earlier likely an error)   Goal of Therapy:  aPTT 50-75 seconds per neurosurgery Monitor platelets by anticoagulation protocol: Yes   Plan:  Continue heparin infusion at 2300 units/hr Daily aPTT/CBC Monitor daily CBC, s/s bleeding F/U ability to transition to apixaban when Neurosurgery approves (possibly next Monday, per surgeon note)  Narda Bonds, PharmD, [place] Pharmacist Phone: [phone]

## 2021-01-03 NOTE — Progress Notes (Signed)
Subjective: Patient reports pain well-controlled, Remains afebrile  Objective: Vital signs in last 24 hours: Temp:  [97.7 F (36.5 C)-98.3 F (36.8 C)] 97.7 F (36.5 C) (08/25 0823) Pulse Rate:  [70-91] 78 (08/25 0823) Resp:  [12-20] 12 (08/25 0823) BP: (145-179)/(78-99) 145/99 (08/25 0823) SpO2:  [96 %-98 %] 98 % (08/25 0823) Weight:  [112.4 kg] 112.4 kg (08/25 0500)  Intake/Output from previous day: 08/24 0701 - 08/25 0700 In: -  Out: 1 [Stool:1] Intake/Output this shift: No intake/output data recorded.  NAD, phonating better Generalized deconditioning, lower extremities 4/5 Flank incision well-healed  Lab Results: Recent Labs    01/01/21 0407 01/02/21 1759  WBC 12.6* 11.6*  HGB 7.5* 7.8*  HCT 24.5* 25.1*  PLT 353 301   BMET Recent Labs    01/02/21 0643 01/03/21 0214  NA 149* 142  K 3.9 3.6  CL 117* 112*  CO2 24 23  GLUCOSE 114* 113*  BUN 47* 40*  CREATININE 1.72* 1.47*  CALCIUM 8.5* 8.4*    Studies/Results: No results found.  Assessment/Plan: S/p T10-iliac fusion - transition to SNF - remove staples tomorrow  LOS: 22 days     Vallarie Mare 01/03/2021, 10:12 AM

## 2021-01-04 LAB — CULTURE, BLOOD (ROUTINE X 2)
Culture: NO GROWTH
Culture: NO GROWTH
Special Requests: ADEQUATE
Special Requests: ADEQUATE

## 2021-01-04 LAB — CBC
HCT: 23.2 % — ABNORMAL LOW (ref 39.0–52.0)
Hemoglobin: 7.4 g/dL — ABNORMAL LOW (ref 13.0–17.0)
MCH: 28.4 pg (ref 26.0–34.0)
MCHC: 31.9 g/dL (ref 30.0–36.0)
MCV: 88.9 fL (ref 80.0–100.0)
Platelets: 252 10*3/uL (ref 150–400)
RBC: 2.61 MIL/uL — ABNORMAL LOW (ref 4.22–5.81)
RDW: 17.8 % — ABNORMAL HIGH (ref 11.5–15.5)
WBC: 10 10*3/uL (ref 4.0–10.5)
nRBC: 0 % (ref 0.0–0.2)

## 2021-01-04 LAB — RENAL FUNCTION PANEL
Albumin: 2.3 g/dL — ABNORMAL LOW (ref 3.5–5.0)
Anion gap: 10 (ref 5–15)
BUN: 29 mg/dL — ABNORMAL HIGH (ref 6–20)
CO2: 21 mmol/L — ABNORMAL LOW (ref 22–32)
Calcium: 8.6 mg/dL — ABNORMAL LOW (ref 8.9–10.3)
Chloride: 110 mmol/L (ref 98–111)
Creatinine, Ser: 1.27 mg/dL — ABNORMAL HIGH (ref 0.61–1.24)
GFR, Estimated: >60 mL/min (ref >60)
Glucose, Bld: 120 mg/dL — ABNORMAL HIGH (ref 70–99)
Phosphorus: 3.9 mg/dL (ref 2.5–4.6)
Potassium: 3.7 mmol/L (ref 3.5–5.1)
Sodium: 141 mmol/L (ref 135–145)

## 2021-01-04 LAB — GLUCOSE, CAPILLARY
Glucose-Capillary: 109 mg/dL — ABNORMAL HIGH (ref 70–99)
Glucose-Capillary: 111 mg/dL — ABNORMAL HIGH (ref 70–99)
Glucose-Capillary: 114 mg/dL — ABNORMAL HIGH (ref 70–99)
Glucose-Capillary: 100 mg/dL — ABNORMAL HIGH (ref 70–99)
Glucose-Capillary: 104 mg/dL — ABNORMAL HIGH (ref 70–99)

## 2021-01-04 LAB — MAGNESIUM: Magnesium: 1.5 mg/dL — ABNORMAL LOW (ref 1.7–2.4)

## 2021-01-04 MED ORDER — FREE WATER
200.0000 mL | Status: DC
  Administered 2021-01-04 – 2021-01-08 (×40): 200 mL

## 2021-01-04 MED ORDER — LEVETIRACETAM 100 MG/ML PO SOLN
1000.0000 mg | Freq: Two times a day (BID) | ORAL | Status: DC
  Administered 2021-01-04 – 2021-01-05 (×2): 1000 mg
  Filled 2021-01-04 (×2): qty 10

## 2021-01-04 MED ORDER — APIXABAN 5 MG PO TABS
5.0000 mg | ORAL_TABLET | Freq: Two times a day (BID) | ORAL | Status: DC
  Administered 2021-01-04 (×2): 5 mg via ORAL
  Filled 2021-01-04 (×2): qty 1

## 2021-01-04 MED ORDER — CEPHALEXIN 250 MG PO CAPS
250.0000 mg | ORAL_CAPSULE | Freq: Three times a day (TID) | ORAL | Status: DC
  Administered 2021-01-04 – 2021-01-17 (×38): 250 mg via ORAL
  Filled 2021-01-04 (×40): qty 1

## 2021-01-04 MED ORDER — NEPRO/CARBSTEADY PO LIQD
1000.0000 mL | ORAL | Status: DC
  Administered 2021-01-04 – 2021-01-07 (×4): 1000 mL
  Filled 2021-01-04 (×4): qty 1000

## 2021-01-04 NOTE — Progress Notes (Signed)
Given recurrence of serosanguinous drainage after removing his staples, will place him on oral Keflex for prophylaxis for 2 weeks or until drainage stops.  If his drainage persists, we may reduce his Eliquis to 2.5 mg BID.

## 2021-01-04 NOTE — Progress Notes (Addendum)
RN went in to check pt and noted that pt was restless and verbalizing pain. Pt had pulled off tele electrodes, ID bracelet, removed his IV, and gown. RN noted that there was  drainage on pt sheet. When rolled pt over, RN noted there was large amount of  drainage to the middle of pt back dressing. Pt had staples removed from pt back by provider earlier this morning. Drainage was pooled in dressing. RN removed bottom half of dressing and noted that the middle of incision was oozing serosanguinous drainage. Charge RN was called to room and RN placed pressure dressing on site.   [facility] was called to make provider aware. Clinic RN to relay msg to provider.   Pressure dressing for now, daily dressing with ABD pads per provider.

## 2021-01-04 NOTE — Progress Notes (Signed)
[place] for IV Heparin Indication: DVT  No Known Allergies  Patient Measurements: Height: 6' (182.9 cm) Weight: 112.4 kg (247 lb 12.8 oz) IBW/kg (Calculated) : 77.6 Heparin Dosing Weight: 99 kg  Vital Signs: Temp: 97.8 F (36.6 C) (08/26 0754) Temp Source: Oral (08/26 0754) BP: 169/88 (08/26 0934) Pulse Rate: 73 (08/26 0934)  Labs: Recent Labs    01/02/21 0643 01/02/21 1627 01/02/21 1759 01/03/21 0214 01/03/21 0526  HGB  --   --  7.8*  --   --   HCT  --   --  25.1*  --   --   PLT  --   --  301  --   --   APTT 80* 63*  --  40* 66*  CREATININE 1.72*  --   --  1.47*  --     Estimated Creatinine Clearance: 70.9 mL/min (A) (by C-G formula based on SCr of 1.47 mg/dL (H)).  Assessment: 60 yr old male with RLE DVT, S/P recent lumbar fusion. Patient continues on IV heparin and was transitioned to [facility] per nephrology on 8/14. AKI now resolving with no further HD indicated per Nephrology. IVC filter placed on 12/16/20.   Now plan for transition to Eliquis OK per NSGY today  Goal of Therapy:  aPTT 50-75 seconds per neurosurgery Monitor platelets by anticoagulation protocol: Yes   Plan:  D/c heparin gtt, give Eliquis '5mg'$  BID starting at time gtt is turned off Monitor renal fxn, s/s bleeding  Bertis Ruddy, PharmD Clinical Pharmacist Please check AMION for all [place] numbers 01/04/2021 9:53 AM

## 2021-01-04 NOTE — Progress Notes (Signed)
Subjective: NAEs o/n.  Pt reports pain well-controlled.  Remains afebrile.  Objective: Vital signs in last 24 hours: Temp:  [97.7 F (36.5 C)-98.4 F (36.9 C)] 97.8 F (36.6 C) (08/26 0754) Pulse Rate:  [73-97] 73 (08/26 0934) Resp:  [10-24] 21 (08/26 0754) BP: (95-180)/(64-123) 169/88 (08/26 0934) SpO2:  [95 %-100 %] 100 % (08/26 0754) Weight:  [112.4 kg] 112.4 kg (08/26 0500)  Intake/Output from previous day: 08/25 0701 - 08/26 0700 In: 7161.6 [I.V.:1135.3; NG/GT:6026.3] Out: 2500 [Urine:2500] Intake/Output this shift: No intake/output data recorded.  NAD A+Ox2, remains confused FC x 4, 3/5 strength with generalized deconditioning Staples removed and dressing replaced under sterile conditions.  There is patchy epidermal sloughing, small amount of serosanguinous drainage able to be expressed.  No tenderness, erythema.   Lab Results: Recent Labs    01/02/21 1759  WBC 11.6*  HGB 7.8*  HCT 25.1*  PLT 301   BMET Recent Labs    01/02/21 0643 01/03/21 0214  NA 149* 142  K 3.9 3.6  CL 117* 112*  CO2 24 23  GLUCOSE 114* 113*  BUN 47* 40*  CREATININE 1.72* 1.47*  CALCIUM 8.5* 8.4*    Studies/Results: No results found.  Assessment/Plan: S/p T10-pelvis reconstruction with postop ARF, small CVAs - ok to switch from heparin to eliquis today.   - continue Clamshell when OOB - continue Tfs for supplemental nutrition - appears ready for discharge-- rehab would give him the best ultimate recovery.     Vallarie Mare 01/04/2021, 9:35 AM

## 2021-01-04 NOTE — Progress Notes (Signed)
Physical Therapy Treatment Patient Details Name: Keith Dixon MRN: [id number] DOB: November 14, 1960 Today's Date: 01/04/2021    History of Present Illness Pt is a 60 y.o. male who presented 12/12/20 with back and R>L leg pain. S/p DLIFs L1-2 L2-3 L34 L4-5 and T10 pelvis decompression and fusion 8/3 and second surgery 8/4. ETT 8/4 - 8/15, re-intubated 8/15. Found to have R lower extremity DVT and left IJ DVT with IVC filter placed 8/6. AKI. S/p tracheostomy 8/16. 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 PMH: PTSD, OSA, HTN, tobacco use, bipolar, degenerative scoliosis, seizures    PT Comments    Pt with increased pain and drainage from back today, thus deferred OOB mobility. Pt was motivated to participate in exercises today though. Pt performing rolling for pericare and dressing and bedding changes with maxA. Performed L lower extremity sidelying exercises while nursing changed the bedding and wound dressing. Pt fatigued by the end and unable to perform further exercises with other extremities. Positioned pt to comfort in L sidelying with pillow support to reduce pressure. Will continue to follow acutely. Current recommendations remain appropriate.    Follow Up Recommendations  SNF (would greatly benefit from inpatient rehab if insurance would approve)     Equipment Recommendations  Wheelchair (measurements PT);Wheelchair cushion (measurements PT);Hospital bed;Rolling walker with 5" wheels (hoyer lift)    Recommendations for Other Services       Precautions / Restrictions Precautions Precautions: Fall;Back Precaution Comments: spinal precautions, cortrak Required Braces or Orthoses: Spinal Brace Spinal Brace: Thoracolumbosacral orthotic;Applied in supine position Spinal Brace Comments: clamshell Restrictions Weight Bearing Restrictions: No    Mobility  Bed Mobility Overal bed mobility: Needs  Assistance Bed Mobility: Rolling Rolling: Max assist         General bed mobility comments: Rolling each direction multiple times for pericare, dressing changes, and bedding change with cues for pt to reach to either rail and to flex knees to assist in rotating trunk, maxA. Pt able to pull on top bed rail with bil UE with bed in [place] position to transition superiorly in bed with modAx2.    Transfers                 General transfer comment: Deferred due to pain and wound leakage this date.  Ambulation/Gait             General Gait Details: Unable this date.   Stairs             Wheelchair Mobility    Modified Rankin (Stroke Patients Only) Modified Rankin (Stroke Patients Only) Pre-Morbid Rankin Score: Moderate disability Modified Rankin: Severe disability     Balance                                            Cognition Arousal/Alertness: Awake/alert Behavior During Therapy: WFL for tasks assessed/performed Overall Cognitive Status: Impaired/Different from baseline Area of Impairment: Memory;Attention;Following commands;Problem solving                   Current Attention Level: Sustained Memory: Decreased short-term memory;Decreased recall of precautions Following Commands: Follows one step commands inconsistently;Follows one step commands with increased time     Problem Solving: Slow processing;Decreased initiation;Difficulty sequencing;Requires verbal cues;Requires tactile cues General Comments: Internally distracted by pain. Needs cues to remain on task and extensive multi-modal cues  to perform tasks correctly.      Exercises General Exercises - Lower Extremity Quad Sets: AROM;Left;10 reps;Sidelying (extend knee with PT supporting leg) Gluteal Sets: Strengthening;Left;10 reps;Sidelying;AROM (extend hip with PT supporting leg) Heel Slides: AROM;Strengthening;Left;10 reps;Sidelying (flex knee with PT supporting  leg) Hip Flexion/Marching: AROM;Strengthening;10 reps;Left;Sidelying (flex hip with PT supporting leg)    General Comments        Pertinent Vitals/Pain Pain Assessment: Faces Faces Pain Scale: Hurts even more Pain Location: back Pain Descriptors / Indicators: Discomfort;Grimacing Pain Intervention(s): Limited activity within patient's tolerance;Monitored during session;Repositioned    Home Living                      Prior Function            PT Goals (current goals can now be found in the care plan section) Acute Rehab PT Goals Patient Stated Goal: to try exercises PT Goal Formulation: With patient Time For Goal Achievement: 01/10/21 Potential to Achieve Goals: Fair Progress towards PT goals: Progressing toward goals (limited by pain today)    Frequency    Min 5X/week      PT Plan Current plan remains appropriate    Co-evaluation              AM-PAC PT "6 Clicks" Mobility   Outcome Measure  Help needed turning from your back to your side while in a flat bed without using bedrails?: A Lot Help needed moving from lying on your back to sitting on the side of a flat bed without using bedrails?: Total Help needed moving to and from a bed to a chair (including a wheelchair)?: Total Help needed standing up from a chair using your arms (e.g., wheelchair or bedside chair)?: Total Help needed to walk in hospital room?: Total Help needed climbing 3-5 steps with a railing? : Total 6 Click Score: 7    End of Session   Activity Tolerance: Patient limited by fatigue Patient left: with call bell/phone within reach;in bed;with bed alarm set;with nursing/sitter in room (pt rolled to L with pillows supporting him) Nurse Communication: Mobility status PT Visit Diagnosis: Muscle weakness (generalized) (M62.81);Difficulty in walking, not elsewhere classified (R26.2);Other symptoms and signs involving the nervous system (R29.898);Hemiplegia and  hemiparesis;Pain;Unsteadiness on feet (R26.81) Hemiplegia - Right/Left: Right Hemiplegia - dominant/non-dominant: Dominant Hemiplegia - caused by: Cerebral infarction Pain - part of body:  (back)     Time: [id number] PT Time Calculation (min) (ACUTE ONLY): 17 min  Charges:  $Therapeutic Exercise: 8-22 mins                     Moishe Spice, PT, DPT Acute Rehabilitation Services  Pager: [phone] Office: [place] 01/04/2021, 5:54 PM

## 2021-01-04 NOTE — Progress Notes (Signed)
Nutrition Follow-up  DOCUMENTATION CODES:   Not applicable  INTERVENTION:   Continue nocturnal tube feeds via Cortrak: - Nepro @ 60 ml/hr x 12 hours from 1800 to 0600 (total of 720 ml) - ProSource TF 45 ml TID - Free water flushes per MD, currently 200 ml q 2 hours  Nocturnal tube feeding regimen provides 1416 kcal, 91 grams of protein, and 523 ml of H2O (meets 64% of kcal needs and 70% of protein needs).   Total free water with flushes: 2923 ml   - Nepro Shake po BID between meals, each supplement provides 425 kcal and 19 grams protein   - Encourage PO intake  NUTRITION DIAGNOSIS:   Increased nutrient needs related to wound healing as evidenced by estimated needs.  Ongoing, being addressed via diet advancement, nocturnal TF, and oral nutrition supplements  GOAL:   Patient will meet greater than or equal to 90% of their needs  Progressing  MONITOR:   TF tolerance, Diet advancement  REASON FOR ASSESSMENT:   Consult Enteral/tube feeding initiation and management  ASSESSMENT:   Pt with PMH of anxiety, PTSD, bipolar 1 disorder, depression, HTN, Sz, sleep apnea, and a smoker who was admitted for severe scoliosis with multi-level listhesis.  8/03 - s/p L1-2, L2-3, L3-4, L4-5, DLIFs 8/04 - T10-pelvis (10 procedure) 8/07 - started on CRRT 8/12 - CRRT stopped, tunneled HD catheter placed, Cortrak placed with tip gastric 8/15 - extubated and re-intubated later that day 8/16 - s/p trach 8/20 - trach decannulation secondary to malpositioned tracheostomy 8/22 - diet advanced to dysphagia 3 with thin liquids 8/23 - transitioned to nocturnal TF  Discussed pt with RN. Pt not eating much at meal times and complaining that he is full. Spoke with pt at bedside. Pt confirms that he does not have much of an appetite and also does not like the food here. Discussed plan to decrease rate of nocturnal tube feeds to stimulate appetite further and promote increased PO intake. Pt  agreeable with plan. Pt states that he is drinking some of the supplements but mostly prefers apple juice.  Messaged MD regarding decreasing the volume of pt's free water flushes given sodium is now WNL. MD to decrease from 400 ml q 2 hours to 200 ml q 2 hours.  Meal Completion: 0-60% x 4 documented meals (multiple meal completions missing)  Medications reviewed and include: cholecalciferol, Nepro Shake BID, folic acid, lamictal, protonix, thiamine, vitamin B-12, zinc sulfate 220 mg daily  Labs reviewed: BUN 29, creatinine 1.27, magnesium 1.5, hemoglobin 7.4 CBG's: 96-114 x 24 hours  UOP: 2500 ml x 24 hours I/O's: +23.0 L since admit  Diet Order:   Diet Order             DIET DYS 3 Room service appropriate? Yes with Assist; Fluid consistency: Thin  Diet effective now                   EDUCATION NEEDS:   Not appropriate for education at this time  Skin:  Skin Assessment: Skin Integrity Issues: DTI: L heel Other: previous trach site  Last BM:  01/04/21  Height:   Ht Readings from Last 1 Encounters:  12/24/20 6' (1.829 m)    Weight:   Wt Readings from Last 1 Encounters:  01/04/21 112.4 kg    BMI:  Body mass index is 33.61 kg/m.  Estimated Nutritional Needs:   Kcal:  2200-2500  Protein:  130-150 grams  Fluid:  >2 L/day  Gustavus Bryant, MS, RD, LDN Inpatient Clinical Dietitian Please see AMiON for contact information.

## 2021-01-05 DIAGNOSIS — Z452 Encounter for adjustment and management of vascular access device: Secondary | ICD-10-CM | POA: Diagnosis not present

## 2021-01-05 DIAGNOSIS — I6330 Cerebral infarction due to thrombosis of unspecified cerebral artery: Secondary | ICD-10-CM | POA: Diagnosis not present

## 2021-01-05 DIAGNOSIS — J9601 Acute respiratory failure with hypoxia: Secondary | ICD-10-CM | POA: Diagnosis not present

## 2021-01-05 LAB — CBC
HCT: 21.3 % — ABNORMAL LOW (ref 39.0–52.0)
Hemoglobin: 6.8 g/dL — CL (ref 13.0–17.0)
MCH: 28.2 pg (ref 26.0–34.0)
MCHC: 31.9 g/dL (ref 30.0–36.0)
MCV: 88.4 fL (ref 80.0–100.0)
Platelets: 233 10*3/uL (ref 150–400)
RBC: 2.41 MIL/uL — ABNORMAL LOW (ref 4.22–5.81)
RDW: 18.4 % — ABNORMAL HIGH (ref 11.5–15.5)
WBC: 9 10*3/uL (ref 4.0–10.5)
nRBC: 0 % (ref 0.0–0.2)

## 2021-01-05 LAB — RENAL FUNCTION PANEL
Albumin: 2.3 g/dL — ABNORMAL LOW (ref 3.5–5.0)
Anion gap: 7 (ref 5–15)
BUN: 24 mg/dL — ABNORMAL HIGH (ref 6–20)
CO2: 22 mmol/L (ref 22–32)
Calcium: 8.4 mg/dL — ABNORMAL LOW (ref 8.9–10.3)
Chloride: 109 mmol/L (ref 98–111)
Creatinine, Ser: 1.15 mg/dL (ref 0.61–1.24)
GFR, Estimated: >60 mL/min (ref >60)
Glucose, Bld: 122 mg/dL — ABNORMAL HIGH (ref 70–99)
Phosphorus: 3.6 mg/dL (ref 2.5–4.6)
Potassium: 3.7 mmol/L (ref 3.5–5.1)
Sodium: 138 mmol/L (ref 135–145)

## 2021-01-05 LAB — GLUCOSE, CAPILLARY
Glucose-Capillary: 106 mg/dL — ABNORMAL HIGH (ref 70–99)
Glucose-Capillary: 107 mg/dL — ABNORMAL HIGH (ref 70–99)
Glucose-Capillary: 114 mg/dL — ABNORMAL HIGH (ref 70–99)
Glucose-Capillary: 117 mg/dL — ABNORMAL HIGH (ref 70–99)
Glucose-Capillary: 124 mg/dL — ABNORMAL HIGH (ref 70–99)
Glucose-Capillary: 129 mg/dL — ABNORMAL HIGH (ref 70–99)
Glucose-Capillary: 83 mg/dL (ref 70–99)

## 2021-01-05 LAB — MAGNESIUM: Magnesium: 1.5 mg/dL — ABNORMAL LOW (ref 1.7–2.4)

## 2021-01-05 MED ORDER — FENTANYL CITRATE (PF) 100 MCG/2ML IJ SOLN
INTRAMUSCULAR | Status: AC
  Filled 2021-01-05: qty 2

## 2021-01-05 MED ORDER — LEVETIRACETAM 100 MG/ML PO SOLN
1500.0000 mg | Freq: Two times a day (BID) | ORAL | Status: DC
  Administered 2021-01-05 – 2021-01-08 (×7): 1500 mg
  Filled 2021-01-05 (×7): qty 15

## 2021-01-05 MED ORDER — APIXABAN 2.5 MG PO TABS
2.5000 mg | ORAL_TABLET | Freq: Two times a day (BID) | ORAL | Status: DC
  Administered 2021-01-05 – 2021-01-16 (×23): 2.5 mg via ORAL
  Filled 2021-01-05 (×23): qty 1

## 2021-01-05 NOTE — Progress Notes (Signed)
[facility] CONSULT PROGRESS NOTE    Progress Note  Keith Dixon  [id number] DOB: 24-Jun-1960 DOA: 12/12/2020 PCP: Reubin Milan, MD     Brief Narrative:   Keith Dixon is an 60 y.o. male past medical history of essential hypertension, PTSD, seizure and bipolar disorder comes in for worsening leg pain and cauda equina symptoms.  Neurosurgery was admitted the patient perform thoracolumbar decompression and fusion postop the patient developed respiratory failure requiring ICU admission and ventilator support.  Subsequently developing acute kidney injury requiring CRRT.  He then had multiple bilateral strokes, neurology was consulted.  Required tracheostomy.  ICU Significant Events: 8/3 Admit to neurosurgery, underwent  anterior lateral Interbody fusion of L1-L5, notably had difficult foley insertion requiring urethral dilation 8/4 Stage 2 posterior decompression and fusion, left intubated post-procedure and PCCM consulted 8/5 AKI seen on am labs with evidence of volume depletion on POC [place] 8/6: Patient AKI was due to acute rhabdomyolysis in the setting of prolonged surgery, he remained oliguric.  Ultrasound lower extremity showed acute right lower extremity DVT and left IJ DVT, IVC filter placed 8/7: Patient serum creatinine continues to trend up, he remained oliguric, started on CRRT Head CT showed small multiple bilateral strokes.  EEG was placed  8/9>. No seizures per EEG, remains on Levo, Propofol and fentanyl with good vent synchrony. Remains on CVVH with UF goal of 50 cc per hour, but increasing pressor needs this am  8/11: off pressors, mental status precluding extubation 8/12: Tunneled HD line placement 8/13: Tolerating some PSV on fentanyl 8/13 MRI/A brain > multiple acute/subacute CVA in the cerebellum, left > right thalami, bilateral cerebral hemispheres.  No abnormal findings in the neck, normal angiography 8/14 Began HD 8/15 Extubated and declined to due to  stridor and required reintubation.  8/16 S/p tracheostomy. Hgb 6.7 transfused 1u pRBC likely from actively draining serosanguinous fluid from surgical site on back. NSGY expressed fluid from surgical site until resolution. 8/17 UOP improving  Consultants:  PCCM Nephrology Neurology Urology General medicine   Procedures:  ANTERIOR LATERAL INTERBODY FUSION L1-L5 (12/12/2020) CYSTOSCOPY/URETHRAL DILATION/FOLEY INSERTION (12/12/2020) THORACIC - ILIAC FUSION; L5-S1 TRANSFORAMINAL LUMBAR INTERBODY FUSION (12/13/2020) INTUBATION/MECHANICAL VENTILATION (8/4-8/15) IVC FILTER PLACEMENT (12/16/2020) EEG (12/16/2020)  Assessment/Plan:   Acute respiratory failure with hypoxia and hypercapnia with postoperative respiratory failure: Patient initial intubation was on 12/13/2020 extubated on 12/24/2020. Unfortunately developed stridor requiring reintubation that same day. Subsequent tracheostomy on 12/26/2018 to wean off ventilator. Trach decannulation on 12/29/2020 secondary to malposition of tracheostomy. Neurosurgery recommended to switch from heparin drip to Eliquis on 01/07/2021. Physical therapy has evaluated the patient recommended skilled nursing facility.  Fever of unclear source: Blood cultures were ordered remain negative till date. Last fever was on 12/30/2020. Continue to monitor fever curve closely.  Hypovolemic hypernatremia: Resolved with free water flushes.  Acute metabolic encephalopathy: Now resolved.  ATN likely due to rhabdomyolysis: Likely multifactorial in the setting of ATN and pigment nephropathy Nephrology was consulted and recommended CRRT. Currently urine output is improving over the last 24 hours further management per nephrology. Temporary catheter removed on 12/31/2020. Nephrology has signed off.  Spinal stenosis status post thoracolumbar fusion/decompression: Continue management per primary team.  Acute left IJ DVT/acute right upper extremity DVT Initially the patient  was started on IV heparin infusion neurosurgery recommended stopping heparin considering the high drainage through output. IVC filter was placed on 12/16/2020.  Acute bilateral cortical MCA/ACA watershed infarcts: Neurology was consulted and they were concerned for.  Perioperative hypotension in  the setting of intracranial stenosis but cannot rule out cardioembolic stroke. MRI/MRA of the head and neck was negative.  Bubble studies was negative for PFO. Transthoracic echocardiogram showed no thrombus. Neurology recommended Eliquis upon discharge.  Continue IV heparin and can transition to oral Eliquis on 01/07/2021  Acute on chronic anemia/perioperative blood loss: Status post 5 units of packed red blood cells as hemoglobin has remained stable.  Seizure disorder: Continue Keppra no changes made.  Nutrition: In setting of critical illness and recent intubation. Core track and tube feedings started.  We will try to do feeding tubes at night and encourage him to eat this morning.  Hopefully we can discontinue the core track over the next few days. Speech was consulted who recommended to continue dysphagia 3 diet.   Pressure injury of the left heel: Unsure wounds present on admission.  RN Pressure Injury Documentation: Pressure Injury 12/18/20 Heel Left Deep Tissue Pressure Injury - Purple or maroon localized area of discolored intact skin or blood-filled blister due to damage of underlying soft tissue from pressure and/or shear. (Active)  12/18/20 0800  Location: Heel  Location Orientation: Left  Staging: Deep Tissue Pressure Injury - Purple or maroon localized area of discolored intact skin or blood-filled blister due to damage of underlying soft tissue from pressure and/or shear.  Wound Description (Comments):   Present on Admission:     Estimated body mass index is 33.97 kg/m as calculated from the following:   Height as of this encounter: 6' (1.829 m).   Weight as of this encounter:  113.6 kg.  DVT prophylaxis: heparin Family Communication:wife Status is: Inpatient  Remains inpatient appropriate because:Hemodynamically unstable  Dispo: The patient is from: Home              Anticipated d/c is to: Home              Patient currently is not medically stable to d/c.   Difficult to place patient No Code Status:     Code Status Orders  (From admission, onward)           Start     Ordered   12/12/20 1901  Full code  Continuous        12/12/20 1900           Code Status History     Date Active Date Inactive Code Status Order ID Comments User Context   09/08/2020 1543 09/14/2020 2219 Full Code [id number]  Doran Heater, DO ED   04/23/2019 2054 04/25/2019 1744 Full Code [id number]  Rolm Bookbinder, MD ED   11/26/2013 0334 11/28/2013 1927 Full Code [id number]  Berle Mull, MD ED   11/15/2012 0201 11/16/2012 1810 Full Code [id number]  Derrill Kay, MD Inpatient      Advance Directive Documentation    Flowsheet Row Most Recent Value  Type of Advance Directive Healthcare Power of Attorney  Pre-existing out of facility DNR order (yellow form or pink MOST form) --  "MOST" Form in Place? --         IV Access:   Peripheral IV   Procedures and diagnostic studies:   No results found.   Medical Consultants:   None.   Subjective:    Keith Dixon nonverbal.  Objective:    Vitals:   01/05/21 0200 01/05/21 0335 01/05/21 0500 01/05/21 0714  BP: (!) 167/78 (!) 166/87  (!) 163/68  Pulse: 80 81  88  Resp: 18 (!) 21  17  Temp:  97.8 F (  36.6 C)  98.5 F (36.9 C)  TempSrc:  Oral  Oral  SpO2: 98% 99%  99%  Weight:   113.6 kg   Height:       SpO2: 99 % O2 Flow Rate (L/min): 2 L/min FiO2 (%): 21 %   Intake/Output Summary (Last 24 hours) at 01/05/2021 0920 Last data filed at 01/05/2021 0622 Gross per 24 hour  Intake 2735 ml  Output 1250 ml  Net 1485 ml    Filed Weights   01/03/21 0500 01/04/21 0500 01/05/21 0500  Weight: 112.4  kg 112.4 kg 113.6 kg    Exam: General exam: In no acute distress. Respiratory system: Good air movement and clear to auscultation. Cardiovascular system: S1 & S2 heard, RRR. No JVD. Gastrointestinal system: Abdomen is nondistended, soft and nontender.  Extremities: No pedal edema. Skin: No rashes, lesions or ulcers  Data Reviewed:    Labs: Basic Metabolic Panel: Recent Labs  Lab 01/01/21 0407 01/02/21 0643 01/03/21 0214 01/04/21 1130 01/05/21 0641  NA 152* 149* 142 141 138  K 4.0 3.9 3.6 3.7 3.7  CL 119* 117* 112* 110 109  CO2 '25 24 23 '$ 21* 22  GLUCOSE 139* 114* 113* 120* 122*  BUN 62* 47* 40* 29* 24*  CREATININE 2.32* 1.72* 1.47* 1.27* 1.15  CALCIUM 8.7* 8.5* 8.4* 8.6* 8.4*  MG 1.7 1.6* 1.5* 1.5* 1.5*  PHOS 3.9 3.9 4.2 3.9 3.6    GFR Estimated Creatinine Clearance: 91.1 mL/min (by C-G formula based on SCr of 1.15 mg/dL). Liver Function Tests: Recent Labs  Lab 01/01/21 0407 01/02/21 [id number] 01/03/21 0214 01/04/21 1130 01/05/21 0641  ALBUMIN 2.2* 2.2* 2.2* 2.3* 2.3*    No results for input(s): LIPASE, AMYLASE in the last 168 hours. No results for input(s): AMMONIA in the last 168 hours. Coagulation profile No results for input(s): INR, PROTIME in the last 168 hours. COVID-19 Labs  No results for input(s): DDIMER, FERRITIN, LDH, CRP in the last 72 hours.  Lab Results  Component Value Date   SARSCOV2NAA NEGATIVE 12/12/2020   SARSCOV2NAA RESULT: NEGATIVE 12/10/2020   SARSCOV2NAA NEGATIVE 09/08/2020   [place] NEGATIVE 04/23/2019    CBC: Recent Labs  Lab 12/30/20 0334 01/01/21 0407 01/02/21 1759 01/04/21 1130 01/05/21 0641  WBC 15.1* 12.6* 11.6* 10.0 9.0  HGB 7.9* 7.5* 7.8* 7.4* 6.8*  HCT 25.3* 24.5* 25.1* 23.2* 21.3*  MCV 88.2 88.8 88.7 88.9 88.4  PLT 453* 353 301 252 233    Cardiac Enzymes: No results for input(s): CKTOTAL, CKMB, CKMBINDEX, TROPONINI in the last 168 hours. BNP (last 3 results) No results for input(s): PROBNP in the last  8760 hours. CBG: Recent Labs  Lab 01/04/21 1543 01/04/21 2015 01/04/21 2359 01/05/21 0407 01/05/21 0747  GLUCAP 100* 104* 114* 129* 107*    D-Dimer: No results for input(s): DDIMER in the last 72 hours. Hgb A1c: No results for input(s): HGBA1C in the last 72 hours. Lipid Profile: No results for input(s): CHOL, HDL, LDLCALC, TRIG, CHOLHDL, LDLDIRECT in the last 72 hours. Thyroid function studies: No results for input(s): TSH, T4TOTAL, T3FREE, THYROIDAB in the last 72 hours.  Invalid input(s): FREET3 Anemia work up: No results for input(s): VITAMINB12, FOLATE, FERRITIN, TIBC, IRON, RETICCTPCT in the last 72 hours. Sepsis Labs: Recent Labs  Lab 01/01/21 0407 01/02/21 1759 01/04/21 1130 01/05/21 0641  WBC 12.6* 11.6* 10.0 9.0    Microbiology Recent Results (from the past 240 hour(s))  Culture, blood (routine x 2)     Status: None   Collection  Time: 12/30/20  9:24 AM   Specimen: BLOOD  Result Value Ref Range Status   Specimen Description BLOOD BLOOD LEFT HAND  Final   Special Requests AEROBIC BOTTLE ONLY Blood Culture adequate volume  Final   Culture   Final    NO GROWTH 5 DAYS Performed at [facility], [address], [place], [place] [address]    Report Status 01/04/2021 FINAL  Final  Culture, blood (routine x 2)     Status: None   Collection Time: 12/30/20  9:27 AM   Specimen: BLOOD  Result Value Ref Range Status   Specimen Description BLOOD BLOOD RIGHT HAND  Final   Special Requests AEROBIC BOTTLE ONLY Blood Culture adequate volume  Final   Culture   Final    NO GROWTH 5 DAYS Performed at [facility], [place] [address] [place], [place] [address]    Report Status 01/04/2021 FINAL  Final     Medications:    alteplase  2 mg Intracatheter Once   amLODipine  2.5 mg Per Tube Daily   apixaban  5 mg Oral BID   aspirin  81 mg Per Tube Daily   bacitracin   Topical Daily   cephALEXin  250 mg Oral Q8H   chlorhexidine  15 mL Mouth Rinse BID    Chlorhexidine Gluconate Cloth  6 each Topical Daily   cholecalciferol  2,000 Units Per Tube BID   ciprofloxacin  1 drop Left Eye Q4H while awake   feeding supplement (NEPRO CARB STEADY)  1,000 mL Per Tube Q24H   feeding supplement (NEPRO CARB STEADY)  237 mL Oral BID BM   feeding supplement (PROSource TF)  45 mL Per Tube TID   folic acid  1 mg Per Tube Daily   free water  200 mL Per Tube Q2H   gabapentin  300 mg Per Tube Daily   lamoTRIgine  100 mg Per Tube BID   levETIRAcetam  1,000 mg Per Tube BID   mouth rinse  15 mL Mouth Rinse q12n4p   pantoprazole sodium  40 mg Per Tube QHS   QUEtiapine  200 mg Per Tube QHS   sertraline  100 mg Per Tube QPC breakfast   sodium chloride flush  10-40 mL Intracatheter Q12H   sodium chloride flush  3 mL Intravenous Q12H   thiamine  100 mg Per Tube Daily   vitamin B-12  1,000 mcg Per Tube Daily   zinc sulfate  220 mg Per Tube Daily   Continuous Infusions:  sodium chloride Stopped (12/25/20 1749)      LOS: 24 days   Charlynne Cousins  [facility]  01/05/2021, 9:20 AM

## 2021-01-05 NOTE — Progress Notes (Signed)
Physical Therapy Treatment Patient Details Name: Keith Dixon MRN: [id number] DOB: [birth year] Today's Date: 01/05/2021    History of Present Illness Pt is a 60 y.o. male who presented 12/12/20 with back and R>L leg pain. S/p DLIFs L1-2 L2-3 L34 L4-5 and T10 pelvis decompression and fusion 8/3 and second surgery 8/4. ETT 8/4 - 8/15, re-intubated 8/15. Found to have R lower extremity DVT and left IJ DVT with IVC filter placed 8/6. AKI. S/p tracheostomy 8/16. 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 PMH: PTSD, OSA, HTN, tobacco use, bipolar, degenerative scoliosis, seizures    PT Comments    Pt agreeable to session with focus on progressing OOB mobility despite reports of pain. Pt in sidelying upon arrival of PT and asked for assistance with completing exercises in sidelying and later in supine as described below. The pt was able to complete multiple rolls in bed to allow for donning of brace, and then completed transition to sitting EOB with maxA where he needed modA or UE support to maintain static sitting. The pt will continue to benefit from skilled PT to further progress OOB mobility and strength/power in LE to allow for improved safety with transfers.     Follow Up Recommendations  SNF (would greatly benefit from inpatient rehab if insurance would approve)     Equipment Recommendations  Wheelchair (measurements PT);Wheelchair cushion (measurements PT);Hospital bed;Rolling walker with 5" wheels (hoyer lift)    Recommendations for Other Services       Precautions / Restrictions Precautions Precautions: Fall;Back Precaution Booklet Issued: No Precaution Comments: spinal precautions, cortrak Required Braces or Orthoses: Spinal Brace Spinal Brace: Thoracolumbosacral orthotic;Applied in supine position Spinal Brace Comments: clamshell Restrictions Weight Bearing Restrictions: No    Mobility  Bed  Mobility Overal bed mobility: Needs Assistance Bed Mobility: Rolling;Sit to Sidelying Rolling: Max assist Sidelying to sit: Max assist     Sit to sidelying: Max assist;+2 for physical assistance General bed mobility comments: maxA to complete roll, pt assisting as able to move LE into position, to reach with UE, all movements require significantly increased time and needs support to maintain position while working. The pt required maxA to complete elevation of trunk, but was able to assist some with RUE to push from [facility]. +2 to manage repositioning in bed    Transfers                 General transfer comment: deferred due to pain  Ambulation/Gait                 Stairs             Wheelchair Mobility    Modified Rankin (Stroke Patients Only) Modified Rankin (Stroke Patients Only) Pre-Morbid Rankin Score: Moderate disability Modified Rankin: Severe disability     Balance Overall balance assessment: Needs assistance Sitting-balance support: Single extremity supported;Bilateral upper extremity supported;Feet supported Sitting balance-Leahy Scale: Poor Sitting balance - Comments: modA to maintain without UE support, pt able to hold with use of bed rail and with use of recliner in front of pt for UE support. Postural control: Posterior lean;Left lateral lean                                  Cognition Arousal/Alertness: Awake/alert Behavior During Therapy: WFL for tasks assessed/performed Overall Cognitive Status: Impaired/Different from baseline Area of Impairment: Memory;Attention;Following commands;Problem solving  Current Attention Level: Sustained Memory: Decreased short-term memory;Decreased recall of precautions Following Commands: Follows one step commands with increased time     Problem Solving: Slow processing;Decreased initiation;Difficulty sequencing;Requires verbal cues;Requires tactile cues General  Comments: pt with slow response, but answering approrpiately when given time. making attempt to follow all commands.      Exercises General Exercises - Lower Extremity Ankle Circles/Pumps: AROM;Both;10 reps;Supine Heel Slides: AROM;Both;10 reps;Supine Hip ABduction/ADduction: AROM;Both;10 reps;Sidelying    General Comments General comments (skin integrity, edema, etc.): BP 198/100 (121) while sitting EOB with pt reports of headache and dizziness      Pertinent Vitals/Pain Pain Assessment: Faces Faces Pain Scale: Hurts whole lot Pain Location: back Pain Descriptors / Indicators: Discomfort;Grimacing Pain Intervention(s): Limited activity within patient's tolerance;Monitored during session;Repositioned     PT Goals (current goals can now be found in the care plan section) Acute Rehab PT Goals Patient Stated Goal: to try exercises PT Goal Formulation: With patient Time For Goal Achievement: 01/10/21 Potential to Achieve Goals: Fair Progress towards PT goals: Progressing toward goals    Frequency    Min 5X/week      PT Plan Current plan remains appropriate       AM-PAC PT "6 Clicks" Mobility   Outcome Measure  Help needed turning from your back to your side while in a flat bed without using bedrails?: A Lot Help needed moving from lying on your back to sitting on the side of a flat bed without using bedrails?: Total Help needed moving to and from a bed to a chair (including a wheelchair)?: Total Help needed standing up from a chair using your arms (e.g., wheelchair or bedside chair)?: Total Help needed to walk in hospital room?: Total Help needed climbing 3-5 steps with a railing? : Total 6 Click Score: 7    End of Session Equipment Utilized During Treatment: Back brace Activity Tolerance: Patient limited by fatigue Patient left: with call bell/phone within reach;in bed;with bed alarm set;with nursing/sitter in room Nurse Communication: Mobility status PT Visit  Diagnosis: Muscle weakness (generalized) (M62.81);Difficulty in walking, not elsewhere classified (R26.2);Other symptoms and signs involving the nervous system (R29.898);Hemiplegia and hemiparesis;Pain;Unsteadiness on feet (R26.81) Hemiplegia - Right/Left: Right Hemiplegia - dominant/non-dominant: Dominant Hemiplegia - caused by: Cerebral infarction     Time: 1620-1705 PT Time Calculation (min) (ACUTE ONLY): 45 min  Charges:  $Therapeutic Exercise: 8-22 mins $Therapeutic Activity: 23-37 mins                     Keith Dixon, PT, DPT   Acute Rehabilitation Department Pager #: [phone]   Sandra Cockayne 01/05/2021, 5:21 PM

## 2021-01-05 NOTE — Progress Notes (Signed)
Lab called with critical lab Hgb 6.8 from prior 7.4. Christella Noa, MD made aware.

## 2021-01-05 NOTE — Progress Notes (Signed)
Patient ID: Keith Dixon, male   DOB: [birth year], 60 y.o.   MRN: [id number] BP (!) 178/68 (BP Location: Right Leg)   Pulse 83   Temp 98 F (36.7 C) (Oral)   Resp 20   Ht 6' (1.829 m)   Wt 113.6 kg   SpO2 97%   BMI 33.97 kg/m  Alert, following all commands Wound with drainage, no change

## 2021-01-06 LAB — CBC
HCT: 21 % — ABNORMAL LOW (ref 39.0–52.0)
Hemoglobin: 6.6 g/dL — CL (ref 13.0–17.0)
MCH: 28 pg (ref 26.0–34.0)
MCHC: 31.4 g/dL (ref 30.0–36.0)
MCV: 89 fL (ref 80.0–100.0)
Platelets: 240 10*3/uL (ref 150–400)
RBC: 2.36 MIL/uL — ABNORMAL LOW (ref 4.22–5.81)
RDW: 18.4 % — ABNORMAL HIGH (ref 11.5–15.5)
WBC: 7.9 10*3/uL (ref 4.0–10.5)
nRBC: 0 % (ref 0.0–0.2)

## 2021-01-06 LAB — GLUCOSE, CAPILLARY
Glucose-Capillary: 101 mg/dL — ABNORMAL HIGH (ref 70–99)
Glucose-Capillary: 107 mg/dL — ABNORMAL HIGH (ref 70–99)
Glucose-Capillary: 115 mg/dL — ABNORMAL HIGH (ref 70–99)
Glucose-Capillary: 129 mg/dL — ABNORMAL HIGH (ref 70–99)
Glucose-Capillary: 87 mg/dL (ref 70–99)
Glucose-Capillary: 97 mg/dL (ref 70–99)

## 2021-01-06 LAB — RENAL FUNCTION PANEL
Albumin: 2.2 g/dL — ABNORMAL LOW (ref 3.5–5.0)
Anion gap: 7 (ref 5–15)
BUN: 21 mg/dL — ABNORMAL HIGH (ref 6–20)
CO2: 24 mmol/L (ref 22–32)
Calcium: 8.5 mg/dL — ABNORMAL LOW (ref 8.9–10.3)
Chloride: 109 mmol/L (ref 98–111)
Creatinine, Ser: 1.09 mg/dL (ref 0.61–1.24)
GFR, Estimated: >60 mL/min (ref >60)
Glucose, Bld: 112 mg/dL — ABNORMAL HIGH (ref 70–99)
Phosphorus: 4.1 mg/dL (ref 2.5–4.6)
Potassium: 3.8 mmol/L (ref 3.5–5.1)
Sodium: 140 mmol/L (ref 135–145)

## 2021-01-06 LAB — MAGNESIUM: Magnesium: 1.5 mg/dL — ABNORMAL LOW (ref 1.7–2.4)

## 2021-01-06 NOTE — Discharge Instructions (Addendum)
Wear TLSO clamshell when OOB. Lovenox subQ injection daily Bactrim for 4 weeks No lifting > 25 lbs F/u in clinic in 2-3 weeks for wound check

## 2021-01-06 NOTE — Progress Notes (Signed)
Patient ID: Keith Dixon, male   DOB: May 22, 1960, 60 y.o.   MRN: [id number] BP (!) 167/59   Pulse 81   Temp 97.7 F (36.5 C) (Oral)   Resp 14   Ht 6' (1.829 m)   Wt 113.6 kg   SpO2 99%   BMI 33.97 kg/m  Alert and oriented x 4 Moving lower extremities Will check wound later, currently in a comfortable position Improving overall

## 2021-01-06 NOTE — Progress Notes (Signed)
Notified covering night provider Blount, NP of critical lab, hemoglobin 6.6 today and was 6.8 yesterday. Type and screen lab ordered by NP.

## 2021-01-07 ENCOUNTER — Inpatient Hospital Stay (HOSPITAL_COMMUNITY): Payer: No Typology Code available for payment source

## 2021-01-07 ENCOUNTER — Other Ambulatory Visit (HOSPITAL_COMMUNITY): Payer: Self-pay

## 2021-01-07 DIAGNOSIS — J9601 Acute respiratory failure with hypoxia: Secondary | ICD-10-CM | POA: Diagnosis not present

## 2021-01-07 DIAGNOSIS — I82409 Acute embolism and thrombosis of unspecified deep veins of unspecified lower extremity: Secondary | ICD-10-CM

## 2021-01-07 DIAGNOSIS — Z452 Encounter for adjustment and management of vascular access device: Secondary | ICD-10-CM | POA: Diagnosis not present

## 2021-01-07 DIAGNOSIS — I6330 Cerebral infarction due to thrombosis of unspecified cerebral artery: Secondary | ICD-10-CM | POA: Diagnosis not present

## 2021-01-07 LAB — CBC
HCT: 25.9 % — ABNORMAL LOW (ref 39.0–52.0)
Hemoglobin: 9.2 g/dL — ABNORMAL LOW (ref 13.0–17.0)
MCH: 32.6 pg (ref 26.0–34.0)
MCHC: 35.5 g/dL (ref 30.0–36.0)
MCV: 91.8 fL (ref 80.0–100.0)
Platelets: 140 10*3/uL — ABNORMAL LOW (ref 150–400)
RBC: 2.82 MIL/uL — ABNORMAL LOW (ref 4.22–5.81)
RDW: 13.5 % (ref 11.5–15.5)
WBC: 11.3 10*3/uL — ABNORMAL HIGH (ref 4.0–10.5)
nRBC: 0 % (ref 0.0–0.2)

## 2021-01-07 LAB — RENAL FUNCTION PANEL
Albumin: 3.1 g/dL — ABNORMAL LOW (ref 3.5–5.0)
Anion gap: 9 (ref 5–15)
BUN: 15 mg/dL (ref 6–20)
CO2: 23 mmol/L (ref 22–32)
Calcium: 8.7 mg/dL — ABNORMAL LOW (ref 8.9–10.3)
Chloride: 98 mmol/L (ref 98–111)
Creatinine, Ser: 0.72 mg/dL (ref 0.61–1.24)
GFR, Estimated: >60 mL/min (ref >60)
Glucose, Bld: 150 mg/dL — ABNORMAL HIGH (ref 70–99)
Phosphorus: 1.6 mg/dL — ABNORMAL LOW (ref 2.5–4.6)
Potassium: 4.2 mmol/L (ref 3.5–5.1)
Sodium: 130 mmol/L — ABNORMAL LOW (ref 135–145)

## 2021-01-07 LAB — GLUCOSE, CAPILLARY
Glucose-Capillary: 135 mg/dL — ABNORMAL HIGH (ref 70–99)
Glucose-Capillary: 93 mg/dL (ref 70–99)
Glucose-Capillary: 102 mg/dL — ABNORMAL HIGH (ref 70–99)
Glucose-Capillary: 88 mg/dL (ref 70–99)
Glucose-Capillary: 94 mg/dL (ref 70–99)
Glucose-Capillary: 113 mg/dL — ABNORMAL HIGH (ref 70–99)

## 2021-01-07 LAB — MAGNESIUM: Magnesium: 1.7 mg/dL (ref 1.7–2.4)

## 2021-01-07 MED ORDER — PROPOFOL 1000 MG/100ML IV EMUL
INTRAVENOUS | Status: AC
  Filled 2021-01-07: qty 200

## 2021-01-07 MED ORDER — PHENYLEPHRINE HCL-NACL 20-0.9 MG/250ML-% IV SOLN
INTRAVENOUS | Status: AC
  Filled 2021-01-07: qty 500

## 2021-01-07 NOTE — Progress Notes (Addendum)
This session was performed under the supervision of a licensed SLP. I agree with the following results and recommendations.    Melvena Vink B. Quentin Ore, [facility], [place] Speech Language Pathologist Office: [phone] Pager: [phone]    Speech Language Pathology Treatment: Dysphagia;Cognitive-Linquistic  Patient Details Name: Keith Dixon MRN: [id number] DOB: [birth year] Today's Date: 01/07/2021 Time: [id number] SLP Time Calculation (min) (ACUTE ONLY): 20 min  Assessment / Plan / Recommendation Clinical Impression  Pt was seen for therapy targeting cognition, speech, and swallowing. Overall, pt making improvements from last session across all goals. Pt oriented to date using compensatory strategies of looking at calendar on wall. Pt reports his thinking is getting better, but memory is still an area of weakness. SLP trained pt in compensatory strategies for remembering important information. Pt speech intelligibility has increased since the last targeted session, but articulation remains imprecise and vocal intensity is low. Pt reports his daughter can understand him better this week than last. SLP trained pt in compensatory speech strategies (over-articulation, pausing). SLP observed pt with regular solid and thin liquid diet textures. Pt self-feeding and mastication are both stronger and continue to increase in functionality. Recommend pt continue with Dys3/thin liquid diet to preserve strength and assist in ease with self-feeding. Administer medications whole with thin liquid.   HPI HPI: Pt is a 60 y.o. male who presented 12/12/20 with back and R>L leg pain. S/p DLIFs L1-2 L2-3 L34 L4-5 and T10 pelvis decompression and fusion 8/3 and second surgery 8/4. ETT 8/4 - 8/15, re-intubated 8/15. Found to have R lower extremity DVT and left IJ DVT with IVC filter placed 8/6. AKI. S/p tracheostomy 8/16 and decannulated 8/20. MRI revealed multiple acute/subacute infarctions noted within the cerebellum, thalami  (L>R), and both cerebral hemispheres. PMH: PTSD, OSA, HTN, tobacco use, bipolar, degenerative scoliosis, seizures      SLP Plan  Continue with current plan of care       Recommendations  Diet recommendations: Dysphagia 3 (mechanical soft);Thin liquid Liquids provided via: Straw Medication Administration: Whole meds with liquid Supervision: Patient able to self feed;Intermittent supervision to cue for compensatory strategies                Oral Care Recommendations: Oral care BID Follow up Recommendations: Skilled Nursing facility SLP Visit Diagnosis: Dysphagia, unspecified (R13.10);Cognitive communication deficit (R41.841);Dysarthria and anarthria (R47.1) Plan: Continue with current plan of care       GO             Dewitt Rota, SLP-Student  Deaire Mcwhirter B. Quentin Ore, [facility], [place] Speech Language Pathologist Office: [phone] Pager: [phone]   Dewitt Rota 01/07/2021, 3:36 PM

## 2021-01-07 NOTE — Progress Notes (Signed)
Occupational Therapy Treatment Patient Details Name: Keith Dixon MRN: [id number] DOB: 07/03/1960 Today's Date: 01/07/2021    History of present illness Pt is a 60 y.o. male who presented 12/12/20 with back and R>L leg pain. S/p DLIFs L1-2 L2-3 L34 L4-5 and T10 pelvis decompression and fusion 8/3 and second surgery 8/4. ETT 8/4 - 8/15, re-intubated 8/15. Found to have R lower extremity DVT and left IJ DVT with IVC filter placed 8/6. AKI. S/p tracheostomy 8/16. 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 PMH: PTSD, OSA, HTN, tobacco use, bipolar, degenerative scoliosis, seizures   OT comments  Pt progressed to [facility] for static standing. Pt sustained time in [place] for 5.5 minutes with input for hip extension with pt demonstrates weight shift with cues. Pt powered up from very high bed surface with sara but initiated the static and laughing during session in enjoyment. Pt requesting oob to chair so therapy placing in hoyer pad for maxisky to chair after vascular lab scan by RN staff. Recommendation SNF.   *pt enjoys music  Follow Up Recommendations  SNF    Equipment Recommendations  Wheelchair (measurements OT);Wheelchair cushion (measurements OT);3 in 1 bedside commode    Recommendations for Other Services      Precautions / Restrictions Precautions Precautions: Fall;Back Precaution Booklet Issued: No Precaution Comments: spinal precautions, cortrak Required Braces or Orthoses: Spinal Brace Spinal Brace: Thoracolumbosacral orthotic;Applied in supine position Spinal Brace Comments: clamshell Other Brace: tighten more upon sitting pt benefit from rolling R then L with positioning Restrictions Weight Bearing Restrictions: No       Mobility Bed Mobility Overal bed mobility: Needs Assistance Bed Mobility: Rolling;Sidelying to Sit;Sit to Sidelying Rolling: Mod assist;+2 for physical assistance Sidelying to  sit: Max assist;+2 for physical assistance     Sit to sidelying: Max assist;+2 for physical assistance General bed mobility comments: Mod (A) to roll R and max to roll L side. Pt with TLSO placed and HOB elevated to help with elevation of bed surface. Pt able to help with management of bil LE.    Transfers Overall transfer level: Needs assistance Equipment used: Ambulation equipment used Transfers: Sit to/from Stand     Squat pivot transfers: Max assist;+2 physical assistance     General transfer comment: max +2 to rise, hip extension via posterior facilitation, and steadying. Cues for proper elbow placement on sara plus, as well as hip extension to upright. Standing tolerance x5.5 minutes.(one full DMX song and part of a second)    Balance Overall balance assessment: Needs assistance Sitting-balance support: Single extremity supported;Bilateral upper extremity supported;Feet supported Sitting balance-Leahy Scale: Fair Sitting balance - Comments: Fair to poor: able to sit EOB without PT support briefly, intermittent min-mod posterior assist given pt preference for posterior and L lateral leaning. EOB sitting x10 minutes, max cuing for upright posture, midline positioning Postural control: Posterior lean;Left lateral lean Standing balance support: Bilateral upper extremity supported;During functional activity Standing balance-Leahy Scale: Zero Standing balance comment: max +2, sara plus. Standing tolerance x5.5 minutes with alternating knee flexion/extension against shin guards of sara plus, max cuing for upright posture via shoulder retraction and posterior pelvic facilitation                           ADL either performed or assessed with clinical judgement   ADL Overall ADL's : Needs assistance/impaired Eating/Feeding: Minimal assistance Eating/Feeding Details (indicate cue type and reason): pt noted  to have hamburger in 4 triangles and only eating 1 out 4                                    General ADL Comments: pt following bed mobility to don brace and requires multiple rolls to position     Vision       Perception     Praxis      Cognition Arousal/Alertness: Awake/alert Behavior During Therapy: WFL for tasks assessed/performed Overall Cognitive Status: Impaired/Different from baseline Area of Impairment: Memory;Attention;Following commands;Problem solving                   Current Attention Level: Sustained Memory: Decreased short-term memory;Decreased recall of precautions Following Commands: Follows one step commands with increased time     Problem Solving: Slow processing;Decreased initiation;Difficulty sequencing;Requires verbal cues;Requires tactile cues General Comments: slow processing, but answers accurately when given enough time to process. Pt follows all one-step commands with increased time during session. Step-by-step cuing for sequencing mobility tasks.        Exercises     Shoulder Instructions       General Comments VSS    Pertinent Vitals/ Pain       Pain Assessment: 0-10 Pain Score: 10-Worst pain ever Pain Location: back when standing (5 out 10 supine) Pain Descriptors / Indicators: Discomfort;Grimacing Pain Intervention(s): Monitored during session;Repositioned  Home Living                                          Prior Functioning/Environment              Frequency  Min 2X/week        Progress Toward Goals  OT Goals(current goals can now be found in the care plan section)  Progress towards OT goals: Progressing toward goals  Acute Rehab OT Goals Patient Stated Goal: to listen to DMX OT Goal Formulation: Patient unable to participate in goal setting Time For Goal Achievement: 01/11/21 Potential to Achieve Goals: Good ADL Goals Pt Will Perform Grooming: with mod assist;sitting Additional ADL Goal #1: Pt will complete bed mobility mod +2 (A) as  precursor to adls. Additional ADL Goal #2: pt will tolerate eob sitting with mod (A) for static sitting balance Additional ADL Goal #3: pt will complete basic transfer total +2 mod (A)  Plan Discharge plan needs to be updated    Co-evaluation    PT/OT/SLP Co-Evaluation/Treatment: Yes Reason for Co-Treatment: Complexity of the patient's impairments (multi-system involvement);For patient/therapist safety;To address functional/ADL transfers   OT goals addressed during session: ADL's and self-care;Proper use of Adaptive equipment and DME;Strengthening/ROM      AM-PAC OT "6 Clicks" Daily Activity     Outcome Measure   Help from another person eating meals?: A Lot Help from another person taking care of personal grooming?: A Lot Help from another person toileting, which includes using toliet, bedpan, or urinal?: A Lot Help from another person bathing (including washing, rinsing, drying)?: A Lot Help from another person to put on and taking off regular upper body clothing?: A Lot Help from another person to put on and taking off regular lower body clothing?: A Lot 6 Click Score: 12    End of Session Equipment Utilized During Treatment: Gait belt;Back brace  OT Visit Diagnosis: Unsteadiness on feet (R26.81);Muscle  weakness (generalized) (M62.81);Pain   Activity Tolerance Patient tolerated treatment well   Patient Left in bed;with call bell/phone within reach;Other (comment) (Pending Bed level scan required. Pt positioned in brace and hoyer pad under patient to be lift to chair after scan by RN staff to help facilitation OOB)   Nurse Communication Mobility status;Precautions;Other (comment)        [id number] OT Time Calculation (min): 36 min  Charges: OT General Charges $OT Visit: 1 Visit OT Treatments $Therapeutic Activity: 8-22 mins   Brynn, OTR/L  Acute Rehabilitation Services Pager: [phone] Office: [phone] .    Jeri Modena 01/07/2021, 3:00 PM

## 2021-01-07 NOTE — Progress Notes (Signed)
[facility] CONSULT PROGRESS NOTE    Progress Note  MAVERIC Dixon  [id number] DOB: [birth year] DOA: 12/12/2020 PCP: Reubin Milan, MD     Brief Narrative:   Keith Dixon is an 60 y.o. male past medical history of essential hypertension, PTSD, seizure and bipolar disorder comes in for worsening leg pain and cauda equina symptoms.  Neurosurgery was admitted the patient perform thoracolumbar decompression and fusion postop the patient developed respiratory failure requiring ICU admission and ventilator support.  Subsequently developing acute kidney injury requiring CRRT.  He then had multiple bilateral strokes, neurology was consulted.  Required tracheostomy.  ICU Significant Events: 8/3 Admit to neurosurgery, underwent  anterior lateral Interbody fusion of L1-L5, notably had difficult foley insertion requiring urethral dilation 8/4 Stage 2 posterior decompression and fusion, left intubated post-procedure and PCCM consulted 8/5 AKI seen on am labs with evidence of volume depletion on POC [place] 8/6: Patient AKI was due to acute rhabdomyolysis in the setting of prolonged surgery, he remained oliguric.  Ultrasound lower extremity showed acute right lower extremity DVT and left IJ DVT, IVC filter placed 8/7: Patient serum creatinine continues to trend up, he remained oliguric, started on CRRT Head CT showed small multiple bilateral strokes.  EEG was placed  8/9>. No seizures per EEG, remains on Levo, Propofol and fentanyl with good vent synchrony. Remains on CVVH with UF goal of 50 cc per hour, but increasing pressor needs this am  8/11: off pressors, mental status precluding extubation 8/12: Tunneled HD line placement 8/13: Tolerating some PSV on fentanyl 8/13 MRI/A brain > multiple acute/subacute CVA in the cerebellum, left > right thalami, bilateral cerebral hemispheres.  No abnormal findings in the neck, normal angiography 8/14 Began HD 8/15 Extubated and declined to due to  stridor and required reintubation.  8/16 S/p tracheostomy. Hgb 6.7 transfused 1u pRBC likely from actively draining serosanguinous fluid from surgical site on back. NSGY expressed fluid from surgical site until resolution. 8/17 UOP improving  Consultants:  PCCM Nephrology Neurology Urology General medicine   Procedures:  ANTERIOR LATERAL INTERBODY FUSION L1-L5 (12/12/2020) CYSTOSCOPY/URETHRAL DILATION/FOLEY INSERTION (12/12/2020) THORACIC - ILIAC FUSION; L5-S1 TRANSFORAMINAL LUMBAR INTERBODY FUSION (12/13/2020) INTUBATION/MECHANICAL VENTILATION (8/4-8/15) IVC FILTER PLACEMENT (12/16/2020) EEG (12/16/2020)  Assessment/Plan:   Acute respiratory failure with hypoxia and hypercapnia with postoperative respiratory failure: Patient initial intubation was on 12/13/2020 extubated on 12/24/2020. Unfortunately developed stridor requiring reintubation that same day. Subsequent tracheostomy on 12/26/2018 to wean off ventilator. Trach decannulation on 12/29/2020 secondary to malposition of tracheostomy. Neurosurgery recommended to switch from heparin drip to Eliquis on 01/07/2021. Physical therapy has evaluated the patient recommended skilled nursing facility.  Fever of unclear source: Blood cultures were ordered remain negative till date. Last fever was on 12/30/2020. Continue to monitor off antibiotics.  Hypovolemic hypernatremia: Resolved with free water flushes.  Acute metabolic encephalopathy: Now resolved.  ATN likely due to rhabdomyolysis: Likely multifactorial in the setting of ATN and pigment nephropathy Nephrology was consulted and recommended CRRT. Currently urine output is improving over the last 24 hours further management per nephrology. Temporary catheter removed on 12/31/2020. Nephrology has signed off.  Spinal stenosis status post thoracolumbar fusion/decompression: Continue management per primary team.  Acute left IJ DVT/acute right upper extremity DVT Initially the patient was  started on IV heparin infusion neurosurgery recommended stopping heparin considering the high drainage through output. IVC filter was placed on 12/16/2020.  Acute bilateral cortical MCA/ACA watershed infarcts: Neurology was consulted and they were concerned for.  Perioperative hypotension in the  setting of intracranial stenosis but cannot rule out cardioembolic stroke. MRI/MRA of the head and neck was negative.  Bubble studies was negative for PFO. Transthoracic echocardiogram showed no thrombus. Neurology recommended Eliquis upon discharge.  Continue IV heparin and can transition to oral Eliquis on 01/07/2021  Acute on chronic anemia/perioperative blood loss: Status post 5 units of packed red blood cells as hemoglobin has remained stable.  Seizure disorder: Continue Keppra no changes made.  Nutrition: In setting of critical illness and recent intubation. Core track and tube feedings started.  We will try to do feeding tubes at night and encourage him to eat this morning.  Hopefully we can discontinue the core track over the next few days. Speech was consulted who recommended to continue dysphagia 3 diet.   Pressure injury of the left heel: Unsure wounds present on admission.  RN Pressure Injury Documentation: Pressure Injury 12/18/20 Heel Left Deep Tissue Pressure Injury - Purple or maroon localized area of discolored intact skin or blood-filled blister due to damage of underlying soft tissue from pressure and/or shear. (Active)  12/18/20 0800  Location: Heel  Location Orientation: Left  Staging: Deep Tissue Pressure Injury - Purple or maroon localized area of discolored intact skin or blood-filled blister due to damage of underlying soft tissue from pressure and/or shear.  Wound Description (Comments):   Present on Admission:     Estimated body mass index is 33.88 kg/m as calculated from the following:   Height as of this encounter: 6' (1.829 m).   Weight as of this encounter:  113.3 kg.  DVT prophylaxis: heparin Family Communication:wife Status is: Inpatient  Remains inpatient appropriate because:Hemodynamically unstable  Dispo: The patient is from: Home              Anticipated d/c is to: Home              Patient currently is not medically stable to d/c.   Difficult to place patient No Code Status:     Code Status Orders  (From admission, onward)           Start     Ordered   12/12/20 1901  Full code  Continuous        12/12/20 1900           Code Status History     Date Active Date Inactive Code Status Order ID Comments User Context   09/08/2020 1543 09/14/2020 2219 Full Code [id number]  Doran Heater, DO ED   04/23/2019 2054 04/25/2019 1744 Full Code [id number]  Rolm Bookbinder, MD ED   11/26/2013 0334 11/28/2013 1927 Full Code [id number]  Berle Mull, MD ED   11/15/2012 0201 11/16/2012 1810 Full Code [id number]  Derrill Kay, MD Inpatient      Advance Directive Documentation    Flowsheet Row Most Recent Value  Type of Advance Directive Healthcare Power of Attorney  Pre-existing out of facility DNR order (yellow form or pink MOST form) --  "MOST" Form in Place? --         IV Access:   Peripheral IV   Procedures and diagnostic studies:   No results found.   Medical Consultants:   None.   Subjective:    Keith Dixon has no new complaints today comfortable in position.  Objective:    Vitals:   01/06/21 2353 01/07/21 0348 01/07/21 0355 01/07/21 0804  BP: 127/73 (!) 165/78  (!) 182/67  Pulse: 89 84  90  Resp: 18 13  17  Temp: 97.9 F (36.6 C) 98.3 F (36.8 C)  (!) 97.5 F (36.4 C)  TempSrc: Axillary Oral  Oral  SpO2: 97% 100%    Weight:   113.3 kg   Height:       SpO2: 100 % O2 Flow Rate (L/min): 2 L/min FiO2 (%): 21 %   Intake/Output Summary (Last 24 hours) at 01/07/2021 0916 Last data filed at 01/07/2021 0650 Gross per 24 hour  Intake 360 ml  Output 2800 ml  Net -2440 ml    Filed Weights    01/04/21 0500 01/05/21 0500 01/07/21 0355  Weight: 112.4 kg 113.6 kg 113.3 kg    Exam: General exam: In no acute distress. Respiratory system: Good air movement and clear to auscultation. Cardiovascular system: S1 & S2 heard, RRR. No JVD. Gastrointestinal system: Abdomen is nondistended, soft and nontender.  Extremities: No pedal edema. Skin: No rashes, lesions or ulcers  Data Reviewed:    Labs: Basic Metabolic Panel: Recent Labs  Lab 01/03/21 0214 01/04/21 1130 01/05/21 0641 01/06/21 0419 01/07/21 0427  NA 142 141 138 140 130*  K 3.6 3.7 3.7 3.8 4.2  CL 112* 110 109 109 98  CO2 23 21* '22 24 23  '$ GLUCOSE 113* 120* 122* 112* 150*  BUN 40* 29* 24* 21* 15  CREATININE 1.47* 1.27* 1.15 1.09 0.72  CALCIUM 8.4* 8.6* 8.4* 8.5* 8.7*  MG 1.5* 1.5* 1.5* 1.5* 1.7  PHOS 4.2 3.9 3.6 4.1 1.6*    GFR Estimated Creatinine Clearance: 130.8 mL/min (by C-G formula based on SCr of 0.72 mg/dL). Liver Function Tests: Recent Labs  Lab 01/03/21 0214 01/04/21 1130 01/05/21 0641 01/06/21 0419 01/07/21 0427  ALBUMIN 2.2* 2.3* 2.3* 2.2* 3.1*    No results for input(s): LIPASE, AMYLASE in the last 168 hours. No results for input(s): AMMONIA in the last 168 hours. Coagulation profile No results for input(s): INR, PROTIME in the last 168 hours. COVID-19 Labs  No results for input(s): DDIMER, FERRITIN, LDH, CRP in the last 72 hours.  Lab Results  Component Value Date   SARSCOV2NAA NEGATIVE 12/12/2020   SARSCOV2NAA RESULT: NEGATIVE 12/10/2020   [place] NEGATIVE 09/08/2020   [place] NEGATIVE 04/23/2019    CBC: Recent Labs  Lab 01/02/21 1759 01/04/21 1130 01/05/21 0641 01/06/21 0419 01/07/21 0427  WBC 11.6* 10.0 9.0 7.9 11.3*  HGB 7.8* 7.4* 6.8* 6.6* 9.2*  HCT 25.1* 23.2* 21.3* 21.0* 25.9*  MCV 88.7 88.9 88.4 89.0 91.8  PLT 301 252 233 240 140*    Cardiac Enzymes: No results for input(s): CKTOTAL, CKMB, CKMBINDEX, TROPONINI in the last 168 hours. BNP (last 3  results) No results for input(s): PROBNP in the last 8760 hours. CBG: Recent Labs  Lab 01/06/21 1748 01/06/21 2031 01/06/21 2352 01/07/21 0351 01/07/21 0805  GLUCAP 87 115* 129* 135* 93    D-Dimer: No results for input(s): DDIMER in the last 72 hours. Hgb A1c: No results for input(s): HGBA1C in the last 72 hours. Lipid Profile: No results for input(s): CHOL, HDL, LDLCALC, TRIG, CHOLHDL, LDLDIRECT in the last 72 hours. Thyroid function studies: No results for input(s): TSH, T4TOTAL, T3FREE, THYROIDAB in the last 72 hours.  Invalid input(s): FREET3 Anemia work up: No results for input(s): VITAMINB12, FOLATE, FERRITIN, TIBC, IRON, RETICCTPCT in the last 72 hours. Sepsis Labs: Recent Labs  Lab 01/04/21 1130 01/05/21 0641 01/06/21 0419 01/07/21 0427  WBC 10.0 9.0 7.9 11.3*    Microbiology Recent Results (from the past 240 hour(s))  Culture, blood (routine x 2)  Status: None   Collection Time: 12/30/20  9:24 AM   Specimen: BLOOD  Result Value Ref Range Status   Specimen Description BLOOD BLOOD LEFT HAND  Final   Special Requests AEROBIC BOTTLE ONLY Blood Culture adequate volume  Final   Culture   Final    NO GROWTH 5 DAYS Performed at [facility], [address], [place], [place] [address]    Report Status 01/04/2021 FINAL  Final  Culture, blood (routine x 2)     Status: None   Collection Time: 12/30/20  9:27 AM   Specimen: BLOOD  Result Value Ref Range Status   Specimen Description BLOOD BLOOD RIGHT HAND  Final   Special Requests AEROBIC BOTTLE ONLY Blood Culture adequate volume  Final   Culture   Final    NO GROWTH 5 DAYS Performed at [facility], [place] [address], [place], [place] [address]    Report Status 01/04/2021 FINAL  Final     Medications:    alteplase  2 mg Intracatheter Once   amLODipine  2.5 mg Per Tube Daily   apixaban  2.5 mg Oral BID   aspirin  81 mg Per Tube Daily   bacitracin   Topical Daily   cephALEXin  250 mg Oral Q8H    chlorhexidine  15 mL Mouth Rinse BID   Chlorhexidine Gluconate Cloth  6 each Topical Daily   cholecalciferol  2,000 Units Per Tube BID   feeding supplement (NEPRO CARB STEADY)  1,000 mL Per Tube Q24H   feeding supplement (NEPRO CARB STEADY)  237 mL Oral BID BM   feeding supplement (PROSource TF)  45 mL Per Tube TID   folic acid  1 mg Per Tube Daily   free water  200 mL Per Tube Q2H   gabapentin  300 mg Per Tube Daily   lamoTRIgine  100 mg Per Tube BID   levETIRAcetam  1,500 mg Per Tube BID   mouth rinse  15 mL Mouth Rinse q12n4p   pantoprazole sodium  40 mg Per Tube QHS   QUEtiapine  200 mg Per Tube QHS   sertraline  100 mg Per Tube QPC breakfast   sodium chloride flush  10-40 mL Intracatheter Q12H   sodium chloride flush  3 mL Intravenous Q12H   thiamine  100 mg Per Tube Daily   vitamin B-12  1,000 mcg Per Tube Daily   zinc sulfate  220 mg Per Tube Daily   Continuous Infusions:  sodium chloride Stopped (12/25/20 1749)      LOS: 26 days   Charlynne Cousins  [facility]  01/07/2021, 9:16 AM

## 2021-01-07 NOTE — Progress Notes (Signed)
Bilateral lower extremity venous duplex completed. Refer to "CV Proc" under chart review to view preliminary results.  01/07/2021 2:02 PM Kelby Aline., MHA, RVT, RDCS, RDMS

## 2021-01-07 NOTE — Progress Notes (Signed)
Subjective: Patient remains afebrile  Objective: Vital signs in last 24 hours: Temp:  [97.7 F (36.5 C)-98.4 F (36.9 C)] 98.3 F (36.8 C) (08/29 0348) Pulse Rate:  [80-89] 84 (08/29 0348) Resp:  [13-18] 13 (08/29 0348) BP: (127-177)/(57-78) 165/78 (08/29 0348) SpO2:  [97 %-100 %] 100 % (08/29 0348) Weight:  [113.3 kg] 113.3 kg (08/29 0355)  Intake/Output from previous day: 08/28 0701 - 08/29 0700 In: 360 [P.O.:360] Out: 2800 [Urine:2800] Intake/Output this shift: No intake/output data recorded.  NAD, oriented x 2, still with mild confusion, some paucity of speech FC x 4, 4-/5 in all extremities Lower back dressed, c/d  Lab Results: Recent Labs    01/06/21 0419 01/07/21 0427  WBC 7.9 11.3*  HGB 6.6* 9.2*  HCT 21.0* 25.9*  PLT 240 140*   BMET Recent Labs    01/06/21 0419 01/07/21 0427  NA 140 130*  K 3.8 4.2  CL 109 98  CO2 24 23  GLUCOSE 112* 150*  BUN 21* 15  CREATININE 1.09 0.72  CALCIUM 8.5* 8.7*    Studies/Results: No results found.  Assessment/Plan: S/p T10-pelvis reconstruction - creatinine has normalized - hyponatremic today - remains afebrile, but some leukocytosis   LOS: 26 days     Vallarie Mare 01/07/2021, 7:48 AM

## 2021-01-07 NOTE — Progress Notes (Signed)
Physical Therapy Treatment Patient Details Name: Keith Dixon MRN: [id number] DOB: 1960-12-04 Today's Date: 01/07/2021    History of Present Illness Pt is a 60 y.o. male who presented 12/12/20 with back and R>L leg pain. S/p DLIFs L1-2 L2-3 L34 L4-5 and T10 pelvis decompression and fusion 8/3 and second surgery 8/4. ETT 8/4 - 8/15, re-intubated 8/15. Found to have R lower extremity DVT and left IJ DVT with IVC filter placed 8/6. AKI. S/p tracheostomy 8/16. 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 PMH: PTSD, OSA, HTN, tobacco use, bipolar, degenerative scoliosis, seizures    PT Comments    Pt motivated to progress mobility today, reports goal is to get OOB to recliner. Pt requiring mod-max +2 assist for bed mobility, moving to EOB, and transfer to standing position with use of sara plus. Pt requires max cuing and facilitation for upright sitting and standing balance. Pt with 5+ minutes of standing tolerance in sara plus, lift pad placed under pt in bed for RN to lift pt OOB to chair after LE [place]. Will continue to follow acutely.     Follow Up Recommendations  SNF (would greatly benefit from inpatient rehab if insurance would approve)     Equipment Recommendations  Wheelchair (measurements PT);Wheelchair cushion (measurements PT);Hospital bed;Rolling walker with 5" wheels (hoyer lift)    Recommendations for Other Services       Precautions / Restrictions Precautions Precautions: Fall;Back Precaution Booklet Issued: No Precaution Comments: spinal precautions, cortrak Required Braces or Orthoses: Spinal Brace Spinal Brace: Thoracolumbosacral orthotic;Applied in supine position Spinal Brace Comments: clamshell Restrictions Weight Bearing Restrictions: No    Mobility  Bed Mobility Overal bed mobility: Needs Assistance Bed Mobility: Rolling;Sidelying to Sit;Sit to Sidelying Rolling: Mod assist;+2 for  physical assistance Sidelying to sit: Max assist;+2 for physical assistance     Sit to sidelying: Max assist;+2 for physical assistance General bed mobility comments: mod assist for rolling bilat for clamshell TLSO placement for trunk and hip translation, pt reaching UEs to opposite rail to aid in rolling. max +2 for sidelying<>sit for trunk and LE management, scooting to/from EOB.    Transfers Overall transfer level: Needs assistance Equipment used: Ambulation equipment used Transfers: Sit to/from Stand     Squat pivot transfers: Max assist;+2 physical assistance     General transfer comment: max +2 to rise, hip extension via posterior facilitation, and steadying. Cues for proper elbow placement on sara plus, as well as hip extension to upright. Standing tolerance x5.5 minutes.  Ambulation/Gait             General Gait Details: Unable this date.   Stairs             Wheelchair Mobility    Modified Rankin (Stroke Patients Only) Modified Rankin (Stroke Patients Only) Pre-Morbid Rankin Score: Moderate disability Modified Rankin: Severe disability     Balance Overall balance assessment: Needs assistance Sitting-balance support: Single extremity supported;Bilateral upper extremity supported;Feet supported Sitting balance-Leahy Scale: Fair Sitting balance - Comments: Fair to poor: able to sit EOB without PT support briefly, intermittent min-mod posterior assist given pt preference for posterior and L lateral leaning. EOB sitting x10 minutes, max cuing for upright posture, midline positioning Postural control: Posterior lean;Left lateral lean Standing balance support: Bilateral upper extremity supported;During functional activity Standing balance-Leahy Scale: Zero Standing balance comment: max +2, sara plus. Standing tolerance x5.5 minutes with alternating knee flexion/extension against shin guards of sara plus, max cuing for upright  posture via shoulder retraction and  posterior pelvic facilitation                            Cognition Arousal/Alertness: Awake/alert Behavior During Therapy: WFL for tasks assessed/performed Overall Cognitive Status: Impaired/Different from baseline Area of Impairment: Memory;Attention;Following commands;Problem solving                   Current Attention Level: Sustained Memory: Decreased short-term memory;Decreased recall of precautions Following Commands: Follows one step commands with increased time     Problem Solving: Slow processing;Decreased initiation;Difficulty sequencing;Requires verbal cues;Requires tactile cues General Comments: slow processing, but answers accurately when given enough time to process. Pt follows all one-step commands with increased time during session. Step-by-step cuing for sequencing mobility tasks.      Exercises      General Comments General comments (skin integrity, edema, etc.): vss      Pertinent Vitals/Pain Pain Assessment: 0-10 Pain Score: 10-Worst pain ever Pain Location: back when standing Pain Descriptors / Indicators: Discomfort;Grimacing Pain Intervention(s): Limited activity within patient's tolerance;Monitored during session;Repositioned;Patient requesting pain meds-RN notified    Home Living                      Prior Function            PT Goals (current goals can now be found in the care plan section) Acute Rehab PT Goals Patient Stated Goal: to try exercises PT Goal Formulation: With patient Time For Goal Achievement: 01/10/21 Potential to Achieve Goals: Fair Progress towards PT goals: Progressing toward goals    Frequency    Min 5X/week      PT Plan Current plan remains appropriate    Co-evaluation PT/OT/SLP Co-Evaluation/Treatment: Yes Reason for Co-Treatment: For patient/therapist safety;To address functional/ADL transfers;Complexity of the patient's impairments (multi-system involvement)          AM-PAC  PT "6 Clicks" Mobility   Outcome Measure  Help needed turning from your back to your side while in a flat bed without using bedrails?: A Lot Help needed moving from lying on your back to sitting on the side of a flat bed without using bedrails?: Total Help needed moving to and from a bed to a chair (including a wheelchair)?: Total Help needed standing up from a chair using your arms (e.g., wheelchair or bedside chair)?: Total Help needed to walk in hospital room?: Total Help needed climbing 3-5 steps with a railing? : Total 6 Click Score: 7    End of Session Equipment Utilized During Treatment: Back brace Activity Tolerance: Patient limited by fatigue Patient left: with call bell/phone within reach;in bed;with bed alarm set;Other (comment) ([place] arrived to room) Nurse Communication: Mobility status PT Visit Diagnosis: Muscle weakness (generalized) (M62.81);Difficulty in walking, not elsewhere classified (R26.2);Other symptoms and signs involving the nervous system (R29.898);Hemiplegia and hemiparesis;Pain;Unsteadiness on feet (R26.81) Hemiplegia - Right/Left: Right Hemiplegia - dominant/non-dominant: Dominant Hemiplegia - caused by: Cerebral infarction     Time: [id number] PT Time Calculation (min) (ACUTE ONLY): 34 min  Charges:  $Neuromuscular Re-education: 8-22 mins                     Stacie Glaze, PT DPT Acute Rehabilitation Services Pager [phone]  Office [phone]    [place] 01/07/2021, 2:00 PM

## 2021-01-08 DIAGNOSIS — J9601 Acute respiratory failure with hypoxia: Secondary | ICD-10-CM | POA: Diagnosis not present

## 2021-01-08 DIAGNOSIS — I6330 Cerebral infarction due to thrombosis of unspecified cerebral artery: Secondary | ICD-10-CM | POA: Diagnosis not present

## 2021-01-08 DIAGNOSIS — Z452 Encounter for adjustment and management of vascular access device: Secondary | ICD-10-CM | POA: Diagnosis not present

## 2021-01-08 LAB — RENAL FUNCTION PANEL
Albumin: 2.3 g/dL — ABNORMAL LOW (ref 3.5–5.0)
Anion gap: 7 (ref 5–15)
BUN: 22 mg/dL — ABNORMAL HIGH (ref 6–20)
CO2: 25 mmol/L (ref 22–32)
Calcium: 8.6 mg/dL — ABNORMAL LOW (ref 8.9–10.3)
Chloride: 106 mmol/L (ref 98–111)
Creatinine, Ser: 1.09 mg/dL (ref 0.61–1.24)
GFR, Estimated: >60 mL/min (ref >60)
Glucose, Bld: 114 mg/dL — ABNORMAL HIGH (ref 70–99)
Phosphorus: 4.2 mg/dL (ref 2.5–4.6)
Potassium: 3.6 mmol/L (ref 3.5–5.1)
Sodium: 138 mmol/L (ref 135–145)

## 2021-01-08 LAB — CBC
HCT: 20.7 % — ABNORMAL LOW (ref 39.0–52.0)
HCT: 24.4 % — ABNORMAL LOW (ref 39.0–52.0)
Hemoglobin: 6.5 g/dL — CL (ref 13.0–17.0)
Hemoglobin: 7.7 g/dL — ABNORMAL LOW (ref 13.0–17.0)
MCH: 27.7 pg (ref 26.0–34.0)
MCH: 28 pg (ref 26.0–34.0)
MCHC: 31.4 g/dL (ref 30.0–36.0)
MCHC: 31.6 g/dL (ref 30.0–36.0)
MCV: 88.1 fL (ref 80.0–100.0)
MCV: 88.7 fL (ref 80.0–100.0)
Platelets: 274 10*3/uL (ref 150–400)
Platelets: 302 10*3/uL (ref 150–400)
RBC: 2.35 MIL/uL — ABNORMAL LOW (ref 4.22–5.81)
RBC: 2.75 MIL/uL — ABNORMAL LOW (ref 4.22–5.81)
RDW: 18.2 % — ABNORMAL HIGH (ref 11.5–15.5)
RDW: 17.4 % — ABNORMAL HIGH (ref 11.5–15.5)
WBC: 7.9 10*3/uL (ref 4.0–10.5)
WBC: 8.8 10*3/uL (ref 4.0–10.5)
nRBC: 0.4 % — ABNORMAL HIGH (ref 0.0–0.2)
nRBC: 0 % (ref 0.0–0.2)

## 2021-01-08 LAB — GLUCOSE, CAPILLARY
Glucose-Capillary: 108 mg/dL — ABNORMAL HIGH (ref 70–99)
Glucose-Capillary: 113 mg/dL — ABNORMAL HIGH (ref 70–99)
Glucose-Capillary: 114 mg/dL — ABNORMAL HIGH (ref 70–99)
Glucose-Capillary: 82 mg/dL (ref 70–99)
Glucose-Capillary: 95 mg/dL (ref 70–99)
Glucose-Capillary: 97 mg/dL (ref 70–99)

## 2021-01-08 LAB — PREPARE RBC (CROSSMATCH)

## 2021-01-08 LAB — MAGNESIUM: Magnesium: 1.6 mg/dL — ABNORMAL LOW (ref 1.7–2.4)

## 2021-01-08 MED ORDER — AMLODIPINE BESYLATE 2.5 MG PO TABS
2.5000 mg | ORAL_TABLET | Freq: Every day | ORAL | Status: DC
  Administered 2021-01-09 – 2021-01-31 (×23): 2.5 mg via ORAL
  Filled 2021-01-08 (×23): qty 1

## 2021-01-08 MED ORDER — SENNOSIDES 8.8 MG/5ML PO SYRP
5.0000 mL | ORAL_SOLUTION | Freq: Every evening | ORAL | Status: DC | PRN
  Filled 2021-01-08: qty 5

## 2021-01-08 MED ORDER — ONDANSETRON HCL 4 MG/2ML IJ SOLN
4.0000 mg | Freq: Four times a day (QID) | INTRAMUSCULAR | Status: DC | PRN
  Administered 2021-01-29: 4 mg via INTRAVENOUS
  Filled 2021-01-08: qty 2

## 2021-01-08 MED ORDER — POLYETHYLENE GLYCOL 3350 17 G PO PACK: 17.0000 g | PACK | Freq: Every day | ORAL | Status: DC | PRN

## 2021-01-08 MED ORDER — PANTOPRAZOLE SODIUM 40 MG PO TBEC
40.0000 mg | DELAYED_RELEASE_TABLET | Freq: Every day | ORAL | Status: DC
  Administered 2021-01-08 – 2021-01-30 (×24): 40 mg via ORAL
  Filled 2021-01-08 (×24): qty 1

## 2021-01-08 MED ORDER — SODIUM CHLORIDE 0.9% IV SOLUTION: Freq: Once | INTRAVENOUS | Status: AC

## 2021-01-08 MED ORDER — ASPIRIN 81 MG PO CHEW
81.0000 mg | CHEWABLE_TABLET | Freq: Every day | ORAL | Status: DC
  Administered 2021-01-09 – 2021-01-31 (×23): 81 mg via ORAL
  Filled 2021-01-08 (×24): qty 1

## 2021-01-08 MED ORDER — QUETIAPINE FUMARATE 100 MG PO TABS
200.0000 mg | ORAL_TABLET | Freq: Every day | ORAL | Status: DC
  Administered 2021-01-09 – 2021-01-30 (×22): 200 mg via ORAL
  Filled 2021-01-08 (×22): qty 2

## 2021-01-08 MED ORDER — ZINC SULFATE 220 (50 ZN) MG PO CAPS
220.0000 mg | ORAL_CAPSULE | Freq: Every day | ORAL | Status: DC
  Administered 2021-01-09 – 2021-01-31 (×23): 220 mg via ORAL
  Filled 2021-01-08 (×24): qty 1

## 2021-01-08 MED ORDER — SERTRALINE HCL 100 MG PO TABS
100.0000 mg | ORAL_TABLET | Freq: Every day | ORAL | Status: DC
  Administered 2021-01-09 – 2021-01-31 (×23): 100 mg via ORAL
  Filled 2021-01-08: qty 2
  Filled 2021-01-08: qty 1
  Filled 2021-01-08: qty 2
  Filled 2021-01-08 (×21): qty 1

## 2021-01-08 MED ORDER — FOLIC ACID 1 MG PO TABS
1.0000 mg | ORAL_TABLET | Freq: Every day | ORAL | Status: DC
  Administered 2021-01-09 – 2021-01-31 (×23): 1 mg via ORAL
  Filled 2021-01-08 (×23): qty 1

## 2021-01-08 MED ORDER — ACETAMINOPHEN 160 MG/5ML PO SOLN
650.0000 mg | ORAL | Status: DC | PRN
  Administered 2021-01-10 – 2021-01-18 (×4): 650 mg via ORAL
  Filled 2021-01-08 (×4): qty 20.3

## 2021-01-08 MED ORDER — VITAMIN D 25 MCG (1000 UNIT) PO TABS
2000.0000 [IU] | ORAL_TABLET | Freq: Two times a day (BID) | ORAL | Status: DC
  Administered 2021-01-08 – 2021-01-31 (×46): 2000 [IU] via ORAL
  Filled 2021-01-08 (×45): qty 2

## 2021-01-08 MED ORDER — ONDANSETRON HCL 4 MG PO TABS
4.0000 mg | ORAL_TABLET | Freq: Four times a day (QID) | ORAL | Status: DC | PRN
  Administered 2021-01-10: 4 mg via ORAL
  Filled 2021-01-08: qty 1

## 2021-01-08 MED ORDER — VITAMIN B-12 1000 MCG PO TABS
1000.0000 ug | ORAL_TABLET | Freq: Every day | ORAL | Status: DC
  Administered 2021-01-09 – 2021-01-31 (×23): 1000 ug via ORAL
  Filled 2021-01-08 (×24): qty 1

## 2021-01-08 MED ORDER — ENSURE ENLIVE PO LIQD
237.0000 mL | Freq: Three times a day (TID) | ORAL | Status: DC
  Administered 2021-01-08 – 2021-01-15 (×19): 237 mL via ORAL

## 2021-01-08 MED ORDER — LEVETIRACETAM 100 MG/ML PO SOLN
1500.0000 mg | Freq: Two times a day (BID) | ORAL | Status: DC
  Administered 2021-01-09 – 2021-01-18 (×19): 1500 mg via ORAL
  Filled 2021-01-08 (×19): qty 15

## 2021-01-08 MED ORDER — CYCLOBENZAPRINE HCL 10 MG PO TABS
10.0000 mg | ORAL_TABLET | Freq: Three times a day (TID) | ORAL | Status: DC | PRN
  Administered 2021-01-08 – 2021-01-29 (×11): 10 mg via ORAL
  Filled 2021-01-08 (×11): qty 1

## 2021-01-08 MED ORDER — GABAPENTIN 250 MG/5ML PO SOLN
300.0000 mg | Freq: Every day | ORAL | Status: DC
  Administered 2021-01-09 – 2021-01-18 (×10): 300 mg via ORAL
  Filled 2021-01-08 (×10): qty 6

## 2021-01-08 MED ORDER — LAMOTRIGINE 100 MG PO TABS
100.0000 mg | ORAL_TABLET | Freq: Two times a day (BID) | ORAL | Status: DC
  Administered 2021-01-08 – 2021-01-31 (×46): 100 mg via ORAL
  Filled 2021-01-08 (×45): qty 1

## 2021-01-08 MED ORDER — OXYCODONE HCL 5 MG PO TABS
10.0000 mg | ORAL_TABLET | ORAL | Status: DC | PRN
  Administered 2021-01-08 – 2021-01-31 (×50): 10 mg via ORAL
  Filled 2021-01-08 (×52): qty 2

## 2021-01-08 MED ORDER — DOCUSATE SODIUM 50 MG/5ML PO LIQD
100.0000 mg | Freq: Two times a day (BID) | ORAL | Status: DC | PRN
  Administered 2021-01-10: 100 mg via ORAL
  Filled 2021-01-08: qty 10

## 2021-01-08 NOTE — Progress Notes (Signed)
Cbg 108 per NT

## 2021-01-08 NOTE — Progress Notes (Signed)
Doctor said he will wait until tomorrow to follow up on blood if he needs another transfusion. He wants to know if CIR is an option for him.

## 2021-01-08 NOTE — TOC Progression Note (Addendum)
Transition of Care [facility]) - Progression Note    Patient Details  Name: Keith Dixon MRN: [id number] Date of Birth: December 25, 1960  Transition of Care [facility] CM/SW [place], [place] Phone Number: 01/08/2021, 3:12 PM  Clinical Narrative:      Update: patient's significant other [place], returned call- she confirmed unable to provide the necessary support to be considered for CIR- family requesting to continue to pursue SNF for short term rehab.   CSW uploaded clinicals for [facility] review - patient will need PS[facility] number before he can d/c to SNF. CSW faxed out SNF referrals- will provide bed offers once available.   CSW called patient's significant other, [place], left voice message to return call.   CSW will continue to follow and assist with discharge planning.   Thurmond Butts, MSW, LCSW Clinical Social Worker    Expected Discharge Plan: Skilled Nursing Facility Barriers to Discharge: SNF Pending bed offer  Expected Discharge Plan and Services Expected Discharge Plan: [place] In-house Referral: Clinical Social Work     Living arrangements for the past 2 months: Single Family Home                                       Social Determinants of Health (SDOH) Interventions    Readmission Risk Interventions No flowsheet data found.

## 2021-01-08 NOTE — Progress Notes (Signed)
Subjective: Patient states back pain is well controlled.  Received blood transfusion this am for anemia.  Objective: Vital signs in last 24 hours: Temp:  [97.3 F (36.3 C)-98.2 F (36.8 C)] 98.2 F (36.8 C) (08/30 1146) Pulse Rate:  [82-100] 88 (08/30 1146) Resp:  [15-21] 17 (08/30 1146) BP: (136-172)/(58-96) 172/91 (08/30 1146) SpO2:  [97 %-100 %] 100 % (08/30 1146) Weight:  [110.9 kg] 110.9 kg (08/30 0146)  Intake/Output from previous day: 08/29 0701 - 08/30 0700 In: 2735 [P.O.:480; I.V.:20; Blood:315; NG/GT:1920] Out: 3300 [Urine:3300] Intake/Output this shift: Total I/O In: 315 [Blood:315] Out: 350 [Urine:350]  Awake, alert, Ox2.  More cognitively appropriate today, conversant.  Voice getting stronger, less hoarse R flank incision well-healed Back incision dry 4/5 strength in Les, appears to be regaining conditioning and strength  Lab Results: Recent Labs    01/08/21 0249 01/08/21 1050  WBC 7.9 8.8  HGB 6.5* 7.7*  HCT 20.7* 24.4*  PLT 274 302   BMET Recent Labs    01/07/21 0427 01/08/21 0249  NA 130* 138  K 4.2 3.6  CL 98 106  CO2 23 25  GLUCOSE 150* 114*  BUN 15 22*  CREATININE 0.72 1.09  CALCIUM 8.7* 8.6*    Studies/Results: VAS [place] LOWER EXTREMITY VENOUS (DVT)  Result Date: 01/07/2021  Lower Venous DVT Study Patient Name:  NASARIO MASEK  Date of Exam:   01/07/2021 Medical Rec #: [id number]         Accession #:    [id number] Date of Birth: 1960/08/16          Patient Gender: M Patient Age:   60 years Exam Location:  [facility] Procedure:      VAS [place] LOWER EXTREMITY VENOUS (DVT) Referring Phys: Duffy Rhody --------------------------------------------------------------------------------  Indications: Follow up DVT.  Comparison Study: 12/14/2020 bilateral lower extremity venous duplex- acute DVT                   right profunda femoral vein, and right gastrocnemius veins,                   acute superficial vein thrombosis right great saphenous  vein. Performing Technologist: Maudry Mayhew MHA, RDMS, RVT, RDCS  Examination Guidelines: A complete evaluation includes B-mode imaging, spectral Doppler, color Doppler, and power Doppler as needed of all accessible portions of each vessel. Bilateral testing is considered an integral part of a complete examination. Limited examinations for reoccurring indications may be performed as noted. The reflux portion of the exam is performed with the patient in reverse Trendelenburg.  +---------+---------------+---------+-----------+----------+--------------+ RIGHT    CompressibilityPhasicitySpontaneityPropertiesThrombus Aging +---------+---------------+---------+-----------+----------+--------------+ CFV      Full           Yes      Yes                                 +---------+---------------+---------+-----------+----------+--------------+ SFJ      Full                                                        +---------+---------------+---------+-----------+----------+--------------+ FV Prox  Full                                                        +---------+---------------+---------+-----------+----------+--------------+  FV Mid   Full                                                        +---------+---------------+---------+-----------+----------+--------------+ FV DistalFull                                                        +---------+---------------+---------+-----------+----------+--------------+ PFV      Full                                                        +---------+---------------+---------+-----------+----------+--------------+ POP      Full           Yes      Yes                                 +---------+---------------+---------+-----------+----------+--------------+ PTV      Full                                                        +---------+---------------+---------+-----------+----------+--------------+ PERO     Full                                                         +---------+---------------+---------+-----------+----------+--------------+ GSV      Full                                                        +---------+---------------+---------+-----------+----------+--------------+   +---------+---------------+---------+-----------+----------+--------------+ LEFT     CompressibilityPhasicitySpontaneityPropertiesThrombus Aging +---------+---------------+---------+-----------+----------+--------------+ CFV      Full           Yes      Yes                                 +---------+---------------+---------+-----------+----------+--------------+ SFJ      Full                                                        +---------+---------------+---------+-----------+----------+--------------+ FV Prox  Full                                                        +---------+---------------+---------+-----------+----------+--------------+  FV Mid   Full                                                        +---------+---------------+---------+-----------+----------+--------------+ FV DistalFull                                                        +---------+---------------+---------+-----------+----------+--------------+ PFV      Full                                                        +---------+---------------+---------+-----------+----------+--------------+ POP      Full           Yes      Yes                                 +---------+---------------+---------+-----------+----------+--------------+ PTV      Full                                                        +---------+---------------+---------+-----------+----------+--------------+ PERO     Full                                                        +---------+---------------+---------+-----------+----------+--------------+     Summary: RIGHT: - Findings suggest resolution of previously  noted thrombus. - There is no evidence of superficial venous thrombosis. - There is no evidence of deep vein thrombosis in the lower extremity.  - No cystic structure found in the popliteal fossa.  LEFT: - There is no evidence of deep vein thrombosis in the lower extremity.  - No cystic structure found in the popliteal fossa.  *See table(s) above for measurements and observations. Electronically signed by Servando Snare MD on 01/07/2021 at 5:39:09 PM.    Final     Assessment/Plan: S/p T10-pelvis reconstruction - DVT appears to have resolved.  Will continue Eliquis 2.5 BID and aspirin for recent CVA, but if persistent anemia issues, will transition to prophylactic lovenox instead - doing well with diet; continue to monitor intake - will need to watch his renal function as his BUN and creatinine is up slightly today, though continues to make good urine output. - has remained afebrile   Keith Dixon 01/08/2021, 11:50 AM

## 2021-01-08 NOTE — Progress Notes (Addendum)
[facility] CONSULT PROGRESS NOTE    Progress Note  Keith Dixon  [id number] DOB: 02/18/1961 DOA: 12/12/2020 PCP: Reubin Milan, MD     Brief Narrative:   Keith Dixon is an 60 y.o. male past medical history of essential hypertension, PTSD, seizure and bipolar disorder comes in for worsening leg pain and cauda equina symptoms.  Neurosurgery was admitted the patient perform thoracolumbar decompression and fusion postop the patient developed respiratory failure requiring ICU admission and ventilator support.  Subsequently developing acute kidney injury requiring CRRT.  He then had multiple bilateral strokes, neurology was consulted.  Required tracheostomy.  ICU Significant Events: 8/3 Admit to neurosurgery, underwent  anterior lateral Interbody fusion of L1-L5, notably had difficult foley insertion requiring urethral dilation 8/4 Stage 2 posterior decompression and fusion, left intubated post-procedure and PCCM consulted 8/5 AKI seen on am labs with evidence of volume depletion on POC [place] 8/6: Patient AKI was due to acute rhabdomyolysis in the setting of prolonged surgery, he remained oliguric.  Ultrasound lower extremity showed acute right lower extremity DVT and left IJ DVT, IVC filter placed 8/7: Patient serum creatinine continues to trend up, he remained oliguric, started on CRRT Head CT showed small multiple bilateral strokes.  EEG was placed  8/9>. No seizures per EEG, remains on Levo, Propofol and fentanyl with good vent synchrony. Remains on CVVH with UF goal of 50 cc per hour, but increasing pressor needs this am  8/11: off pressors, mental status precluding extubation 8/12: Tunneled HD line placement 8/13: Tolerating some PSV on fentanyl 8/13 MRI/A brain > multiple acute/subacute CVA in the cerebellum, left > right thalami, bilateral cerebral hemispheres.  No abnormal findings in the neck, normal angiography 8/14 Began HD 8/15 Extubated and declined to due to  stridor and required reintubation.  8/16 S/p tracheostomy. Hgb 6.7 transfused 1u pRBC likely from actively draining serosanguinous fluid from surgical site on back. NSGY expressed fluid from surgical site until resolution. 8/17 UOP improving  Consultants:  PCCM Nephrology Neurology Urology General medicine   Procedures:  ANTERIOR LATERAL INTERBODY FUSION L1-L5 (12/12/2020) CYSTOSCOPY/URETHRAL DILATION/FOLEY INSERTION (12/12/2020) THORACIC - ILIAC FUSION; L5-S1 TRANSFORAMINAL LUMBAR INTERBODY FUSION (12/13/2020) INTUBATION/MECHANICAL VENTILATION (8/4-8/15) IVC FILTER PLACEMENT (12/16/2020) EEG (12/16/2020)  Assessment/Plan:   Acute respiratory failure with hypoxia and hypercapnia with postoperative respiratory failure: Patient initial intubation was on 12/13/2020 extubated on 12/24/2020. Unfortunately developed stridor requiring reintubation that same day. Subsequent tracheostomy on 12/26/2018 to wean off ventilator. Trach decannulation on 12/29/2020 secondary to malposition of tracheostomy. Neurosurgery recommended to switch from heparin drip to Eliquis on 01/07/2021. Physical therapy has evaluated the patient recommended skilled nursing facility. We will go ahead and sign off.  Fever of unclear source: Blood cultures were ordered remain negative till date. Last fever was on 12/30/2020. Given his recurrent serosanguineous drain after removing his staples neurosurgery started him on Keflex which she will continue for 2 weeks.  Hypovolemic hypernatremia: Resolved with free water flushes.  Acute metabolic encephalopathy: Now resolved.  ATN likely due to rhabdomyolysis: Likely multifactorial in the setting of ATN and pigment nephropathy Nephrology was consulted and recommended CRRT. Currently urine output is improving over the last 24 hours further management per nephrology. Temporary catheter removed on 12/31/2020. Nephrology has signed off.  Spinal stenosis status post thoracolumbar  fusion/decompression: Continue management per primary team.  Acute left IJ DVT/acute right upper extremity DVT Initially the patient was started on IV heparin infusion neurosurgery recommended stopping heparin considering the high drainage through output. IVC filter was  placed on 12/16/2020.  Acute bilateral cortical MCA/ACA watershed infarcts: Neurology was consulted and they were concerned for.  Perioperative hypotension in the setting of intracranial stenosis but cannot rule out cardioembolic stroke. MRI/MRA of the head and neck was negative.  Bubble studies was negative for PFO. Transthoracic echocardiogram showed no thrombus. Neurology recommended Eliquis upon discharge.  Continue IV heparin and can transition to oral Eliquis on 01/07/2021  Acute on chronic anemia/perioperative blood loss: Status post 5 units of packed red blood cells as hemoglobin has remained stable. His hemoglobin is low this morning neurosurgery transfuse 2 units of packed red blood cells CBC posttransfusional. Further management per neurosurgery. No melanotic stools or bloody stools, no signs of overt bleeding.  Seizure disorder: Continue Keppra no changes made.  Nutrition: In setting of critical illness and recent intubation. Speech was consulted who recommended to continue dysphagia 3 diet. Discontinue core track and free water will discontinue also Ensure. We will go ahead and sign off.  Pressure injury of the left heel: Unsure wounds present on admission.  RN Pressure Injury Documentation: Pressure Injury 12/18/20 Heel Left Deep Tissue Pressure Injury - Purple or maroon localized area of discolored intact skin or blood-filled blister due to damage of underlying soft tissue from pressure and/or shear. (Active)  12/18/20 0800  Location: Heel  Location Orientation: Left  Staging: Deep Tissue Pressure Injury - Purple or maroon localized area of discolored intact skin or blood-filled blister due to damage of  underlying soft tissue from pressure and/or shear.  Wound Description (Comments):   Present on Admission:     Estimated body mass index is 33.16 kg/m as calculated from the following:   Height as of this encounter: 6' (1.829 m).   Weight as of this encounter: 110.9 kg.  DVT prophylaxis: heparin Family Communication:wife Status is: Inpatient  Remains inpatient appropriate because:Hemodynamically unstable  Dispo: The patient is from: Home              Anticipated d/c is to: Home              Patient currently is not medically stable to d/c.   Difficult to place patient No Code Status:     Code Status Orders  (From admission, onward)           Start     Ordered   12/12/20 1901  Full code  Continuous        12/12/20 1900           Code Status History     Date Active Date Inactive Code Status Order ID Comments User Context   09/08/2020 1543 09/14/2020 2219 Full Code [id number]  Doran Heater, DO ED   04/23/2019 2054 04/25/2019 1744 Full Code [id number]  Rolm Bookbinder, MD ED   11/26/2013 0334 11/28/2013 1927 Full Code [id number]  Berle Mull, MD ED   11/15/2012 0201 11/16/2012 1810 Full Code [id number]  Derrill Kay, MD Inpatient      Advance Directive Documentation    Flowsheet Row Most Recent Value  Type of Advance Directive Healthcare Power of [place]  Pre-existing out of facility DNR order (yellow form or pink MOST form) --  "MOST" Form in Place? --         IV Access:   Peripheral IV   Procedures and diagnostic studies:   VAS [place] LOWER EXTREMITY VENOUS (DVT)  Result Date: 01/07/2021  Lower Venous DVT Study Patient Name:  Keith Dixon  Date of Exam:   01/07/2021 Medical  Rec #: [id number]         Accession #:    [id number] Date of Birth: [birth year]          Patient Gender: M Patient Age:   51 years Exam Location:  [facility] Procedure:      VAS [place] LOWER EXTREMITY VENOUS (DVT) Referring Phys: Duffy Rhody  --------------------------------------------------------------------------------  Indications: Follow up DVT.  Comparison Study: 12/14/2020 bilateral lower extremity venous duplex- acute DVT                   right profunda femoral vein, and right gastrocnemius veins,                   acute superficial vein thrombosis right great saphenous vein. Performing Technologist: Maudry Mayhew MHA, RDMS, RVT, RDCS  Examination Guidelines: A complete evaluation includes B-mode imaging, spectral Doppler, color Doppler, and power Doppler as needed of all accessible portions of each vessel. Bilateral testing is considered an integral part of a complete examination. Limited examinations for reoccurring indications may be performed as noted. The reflux portion of the exam is performed with the patient in reverse Trendelenburg.  +---------+---------------+---------+-----------+----------+--------------+ RIGHT    CompressibilityPhasicitySpontaneityPropertiesThrombus Aging +---------+---------------+---------+-----------+----------+--------------+ CFV      Full           Yes      Yes                                 +---------+---------------+---------+-----------+----------+--------------+ SFJ      Full                                                        +---------+---------------+---------+-----------+----------+--------------+ FV Prox  Full                                                        +---------+---------------+---------+-----------+----------+--------------+ FV Mid   Full                                                        +---------+---------------+---------+-----------+----------+--------------+ FV DistalFull                                                        +---------+---------------+---------+-----------+----------+--------------+ PFV      Full                                                         +---------+---------------+---------+-----------+----------+--------------+ POP      Full           Yes      Yes                                 +---------+---------------+---------+-----------+----------+--------------+  PTV      Full                                                        +---------+---------------+---------+-----------+----------+--------------+ PERO     Full                                                        +---------+---------------+---------+-----------+----------+--------------+ GSV      Full                                                        +---------+---------------+---------+-----------+----------+--------------+   +---------+---------------+---------+-----------+----------+--------------+ LEFT     CompressibilityPhasicitySpontaneityPropertiesThrombus Aging +---------+---------------+---------+-----------+----------+--------------+ CFV      Full           Yes      Yes                                 +---------+---------------+---------+-----------+----------+--------------+ SFJ      Full                                                        +---------+---------------+---------+-----------+----------+--------------+ FV Prox  Full                                                        +---------+---------------+---------+-----------+----------+--------------+ FV Mid   Full                                                        +---------+---------------+---------+-----------+----------+--------------+ FV DistalFull                                                        +---------+---------------+---------+-----------+----------+--------------+ PFV      Full                                                        +---------+---------------+---------+-----------+----------+--------------+ POP      Full           Yes      Yes                                  +---------+---------------+---------+-----------+----------+--------------+  PTV      Full                                                        +---------+---------------+---------+-----------+----------+--------------+ PERO     Full                                                        +---------+---------------+---------+-----------+----------+--------------+     Summary: RIGHT: - Findings suggest resolution of previously noted thrombus. - There is no evidence of superficial venous thrombosis. - There is no evidence of deep vein thrombosis in the lower extremity.  - No cystic structure found in the popliteal fossa.  LEFT: - There is no evidence of deep vein thrombosis in the lower extremity.  - No cystic structure found in the popliteal fossa.  *See table(s) above for measurements and observations. Electronically signed by Servando Snare MD on 01/07/2021 at 5:39:09 PM.    Final      Medical Consultants:   None.   Subjective:    Keith Dixon tolerating his diet.  Objective:    Vitals:   01/08/21 0325 01/08/21 0545 01/08/21 0553 01/08/21 0605  BP: (!) 143/80 (!) 156/86 (!) 158/96 (!) 154/90  Pulse: 90 97 90 92  Resp: 15 19 (!) 21 (!) 21  Temp: 98 F (36.7 C) 97.8 F (36.6 C) 97.8 F (36.6 C) 97.8 F (36.6 C)  TempSrc: Oral Axillary Axillary Oral  SpO2: 100% 100% 100% 98%  Weight:      Height:       SpO2: 98 % O2 Flow Rate (L/min): 2 L/min FiO2 (%): 21 %   Intake/Output Summary (Last 24 hours) at 01/08/2021 0749 Last data filed at 01/08/2021 0711 Gross per 24 hour  Intake 2735 ml  Output 3650 ml  Net -915 ml    Filed Weights   01/05/21 0500 01/07/21 0355 01/08/21 0146  Weight: 113.6 kg 113.3 kg 110.9 kg    Exam: General exam: In no acute distress. Respiratory system: Good air movement and clear to auscultation. Cardiovascular system: S1 & S2 heard, RRR. No JVD. Gastrointestinal system: Abdomen is nondistended, soft and nontender.  Extremities: No  pedal edema. Skin: No rashes, lesions or ulcers  Data Reviewed:    Labs: Basic Metabolic Panel: Recent Labs  Lab 01/04/21 1130 01/05/21 0641 01/06/21 0419 01/07/21 0427 01/08/21 0249  NA 141 138 140 130* 138  K 3.7 3.7 3.8 4.2 3.6  CL 110 109 109 98 106  CO2 21* '22 24 23 25  '$ GLUCOSE 120* 122* 112* 150* 114*  BUN 29* 24* 21* 15 22*  CREATININE 1.27* 1.15 1.09 0.72 1.09  CALCIUM 8.6* 8.4* 8.5* 8.7* 8.6*  MG 1.5* 1.5* 1.5* 1.7 1.6*  PHOS 3.9 3.6 4.1 1.6* 4.2    GFR Estimated Creatinine Clearance: 95 mL/min (by C-G formula based on SCr of 1.09 mg/dL). Liver Function Tests: Recent Labs  Lab 01/04/21 1130 01/05/21 0641 01/06/21 0419 01/07/21 0427 01/08/21 0249  ALBUMIN 2.3* 2.3* 2.2* 3.1* 2.3*    No results for input(s): LIPASE, AMYLASE in the last 168 hours. No results for input(s): AMMONIA in the last 168 hours. Coagulation  profile No results for input(s): INR, PROTIME in the last 168 hours. COVID-19 Labs  No results for input(s): DDIMER, FERRITIN, LDH, CRP in the last 72 hours.  Lab Results  Component Value Date   SARSCOV2NAA NEGATIVE 12/12/2020   SARSCOV2NAA RESULT: NEGATIVE 12/10/2020   SARSCOV2NAA NEGATIVE 09/08/2020   [place] NEGATIVE 04/23/2019    CBC: Recent Labs  Lab 01/04/21 1130 01/05/21 0641 01/06/21 0419 01/07/21 0427 01/08/21 0249  WBC 10.0 9.0 7.9 11.3* 7.9  HGB 7.4* 6.8* 6.6* 9.2* 6.5*  HCT 23.2* 21.3* 21.0* 25.9* 20.7*  MCV 88.9 88.4 89.0 91.8 88.1  PLT 252 233 240 140* 274    Cardiac Enzymes: No results for input(s): CKTOTAL, CKMB, CKMBINDEX, TROPONINI in the last 168 hours. BNP (last 3 results) No results for input(s): PROBNP in the last 8760 hours. CBG: Recent Labs  Lab 01/07/21 1217 01/07/21 1654 01/07/21 1951 01/07/21 2328 01/08/21 0326  GLUCAP 102* 88 94 113* 114*    D-Dimer: No results for input(s): DDIMER in the last 72 hours. Hgb A1c: No results for input(s): HGBA1C in the last 72 hours. Lipid  Profile: No results for input(s): CHOL, HDL, LDLCALC, TRIG, CHOLHDL, LDLDIRECT in the last 72 hours. Thyroid function studies: No results for input(s): TSH, T4TOTAL, T3FREE, THYROIDAB in the last 72 hours.  Invalid input(s): FREET3 Anemia work up: No results for input(s): VITAMINB12, FOLATE, FERRITIN, TIBC, IRON, RETICCTPCT in the last 72 hours. Sepsis Labs: Recent Labs  Lab 01/05/21 0641 01/06/21 0419 01/07/21 0427 01/08/21 0249  WBC 9.0 7.9 11.3* 7.9    Microbiology Recent Results (from the past 240 hour(s))  Culture, blood (routine x 2)     Status: None   Collection Time: 12/30/20  9:24 AM   Specimen: BLOOD  Result Value Ref Range Status   Specimen Description BLOOD BLOOD LEFT HAND  Final   Special Requests AEROBIC BOTTLE ONLY Blood Culture adequate volume  Final   Culture   Final    NO GROWTH 5 DAYS Performed at [facility], [address] [place], [place] [address]    Report Status 01/04/2021 FINAL  Final  Culture, blood (routine x 2)     Status: None   Collection Time: 12/30/20  9:27 AM   Specimen: BLOOD  Result Value Ref Range Status   Specimen Description BLOOD BLOOD RIGHT HAND  Final   Special Requests AEROBIC BOTTLE ONLY Blood Culture adequate volume  Final   Culture   Final    NO GROWTH 5 DAYS Performed at [facility], [place] [address], [place], [place] [address]    Report Status 01/04/2021 FINAL  Final     Medications:    alteplase  2 mg Intracatheter Once   amLODipine  2.5 mg Per Tube Daily   apixaban  2.5 mg Oral BID   aspirin  81 mg Per Tube Daily   bacitracin   Topical Daily   cephALEXin  250 mg Oral Q8H   chlorhexidine  15 mL Mouth Rinse BID   Chlorhexidine Gluconate Cloth  6 each Topical Daily   cholecalciferol  2,000 Units Per Tube BID   folic acid  1 mg Per Tube Daily   gabapentin  300 mg Per Tube Daily   lamoTRIgine  100 mg Per Tube BID   levETIRAcetam  1,500 mg Per Tube BID   mouth rinse  15 mL Mouth Rinse q12n4p    pantoprazole sodium  40 mg Per Tube QHS   QUEtiapine  200 mg Per  Tube QHS   sertraline  100 mg Per Tube QPC breakfast   vitamin B-12  1,000 mcg Per Tube Daily   zinc sulfate  220 mg Per Tube Daily   Continuous Infusions:  sodium chloride Stopped (12/25/20 1749)      LOS: 27 days   Charlynne Cousins  [facility]  01/08/2021, 7:49 AM

## 2021-01-08 NOTE — Progress Notes (Signed)
Received critical lab value of 6.5 hemoglobin

## 2021-01-08 NOTE — Progress Notes (Signed)
Inpatient Rehabilitation Admissions Coordinator   CIR previously consulted and on 12/31/2020 we signed off for family and significant other unable to provide the 24/7 caregiver support that he will need after a CIR admit. They requested SNF rehab and TOC is facilitating this. We will not pursue CIR admit at this time. Please call me with any questions.  Danne Baxter, RN, MSN Rehab Admissions Coordinator [phone] 01/08/2021 1:41 PM

## 2021-01-08 NOTE — Progress Notes (Signed)
Occupational Therapy Treatment Patient Details Name: Keith Dixon MRN: [id number] DOB: 1960-10-13 Today's Date: 01/08/2021    History of present illness Pt is a 60 y.o. male who presented 12/12/20 with back and R>L leg pain. S/p DLIFs L1-2 L2-3 L34 L4-5 and T10 pelvis decompression and fusion 8/3 and second surgery 8/4. ETT 8/4 - 8/15, re-intubated 8/15. Found to have R lower extremity DVT and left IJ DVT with IVC filter placed 8/6. AKI. S/p tracheostomy 8/16. 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 PMH: PTSD, OSA, HTN, tobacco use, bipolar, degenerative scoliosis, seizures   OT comments  Pt progressed from OOB to chair with sara this session with increased activity tolerance. Recommend hoyer oob to chair for all meals. Pt with increased ability to self feed with setup from this position. Pt unaware of reason for admission. Pt states "they said I had back surgery but I dont remember it" pt able to state day as Tuesday aug 30 th when asked correctly. Recommendation for SNf at this time due to family (A) with CIR declining. Pt progressing well.   Follow Up Recommendations  SNF    Equipment Recommendations  Wheelchair (measurements OT);Wheelchair cushion (measurements OT);3 in 1 bedside commode    Recommendations for Other Services      Precautions / Restrictions Precautions Precautions: Fall;Back Precaution Comments: spinal precautions, cortrak Required Braces or Orthoses: Spinal Brace Spinal Brace: Thoracolumbosacral orthotic;Applied in supine position Spinal Brace Comments: clamshell       Mobility Bed Mobility Overal bed mobility: Needs Assistance Bed Mobility: Rolling;Sidelying to Sit;Sit to Sidelying Rolling: Mod assist;+2 for physical assistance Sidelying to sit: +2 for physical assistance;Mod assist       General bed mobility comments: Pt requires (A) to elevate trunk from bed surface total  +2 max (A) with decreased HOB elevation. pt reaching for foot board with L UE. pt static sitting max to min (A)    Transfers Overall transfer level: Needs assistance Equipment used: Ambulation equipment used Transfers: Sit to/from Stand     Squat pivot transfers: Max assist;+2 physical assistance     General transfer comment: Pt with decreased surface height and powered up with sara this session. pt sustained standing 8 minutes resting in chair and then completed another 4 minute static stand. pt able to depress scapual more this session and extend neck    Balance Overall balance assessment: Needs assistance                                         ADL either performed or assessed with clinical judgement   ADL Overall ADL's : Needs assistance/impaired Eating/Feeding: Set up Eating/Feeding Details (indicate cue type and reason): pt sitting in chair and eating food with smaller container and bil UE use. pt with ataxic right UE movement. pt enjoying having lunch watching SoAPs                                   General ADL Comments: Pt progressed from bed to chair to finish lunch. pt sustained static standing and watching soaps.     Vision       Perception     Praxis      Cognition Arousal/Alertness: Awake/alert Behavior During Therapy: WFL for tasks assessed/performed Overall Cognitive Status: Impaired/Different from  baseline                                 General Comments: pt states that he was told he had back surgery but he doesnt remember any thing about that. Pt seems surprise to learn surgery and CVA occurred. pt responds "well that make more sense now." when asked "what makes more sense pt states "it makes sense that i had it now that you told me but i dont know why my daugther doesnt know why i had this happen"        Exercises     Shoulder Instructions       General Comments VSS    Pertinent Vitals/ Pain        Pain Assessment: 0-10 Pain Score: 2  Pain Location: back Pain Intervention(s): Monitored during session;Repositioned  Home Living                                          Prior Functioning/Environment              Frequency  Min 2X/week        Progress Toward Goals  OT Goals(current goals can now be found in the care plan section)  Progress towards OT goals: Progressing toward goals  Acute Rehab OT Goals Patient Stated Goal: watching Soaps on TV OT Goal Formulation: Patient unable to participate in goal setting Time For Goal Achievement: 01/11/21 Potential to Achieve Goals: Good ADL Goals Pt Will Perform Grooming: with min assist;sitting Additional ADL Goal #1: Pt will complete bed mobility mod +2 (A) as precursor to adls. Additional ADL Goal #2: pt will tolerate eob sitting with mod (A) for static sitting balance Additional ADL Goal #3: pt will complete basic transfer total +2 mod (A)  Plan Discharge plan needs to be updated    Co-evaluation    PT/OT/SLP Co-Evaluation/Treatment: Yes Reason for Co-Treatment: Complexity of the patient's impairments (multi-system involvement);Necessary to address cognition/behavior during functional activity;For patient/therapist safety;To address functional/ADL transfers   OT goals addressed during session: ADL's and self-care;Proper use of Adaptive equipment and DME;Strengthening/ROM      AM-PAC OT "6 Clicks" Daily Activity     Outcome Measure   Help from another person eating meals?: A Little Help from another person taking care of personal grooming?: A Little Help from another person toileting, which includes using toliet, bedpan, or urinal?: A Lot Help from another person bathing (including washing, rinsing, drying)?: A Lot Help from another person to put on and taking off regular upper body clothing?: A Lot Help from another person to put on and taking off regular lower body clothing?: A Lot 6 Click  Score: 14    End of Session Equipment Utilized During Treatment: Gait belt;Back brace  OT Visit Diagnosis: Unsteadiness on feet (R26.81);Muscle weakness (generalized) (M62.81);Pain   Activity Tolerance Patient tolerated treatment well   Patient Left with call bell/phone within reach;Other (comment);in chair;with chair alarm set;with nursing/sitter in room (hoyer pad under patient for transfer for RN staff back to bed)   Nurse Communication Mobility status;Precautions;Other (comment)        [id number] OT Time Calculation (min): 35 min  Charges: OT General Charges $OT Visit: 1 Visit OT Treatments $Self Care/Home Management : 8-22 mins   Brynn, OTR/L  Acute Rehabilitation Services Pager: [phone]  Office: [phone] .    Jeri Modena 01/08/2021, 2:48 PM

## 2021-01-08 NOTE — Progress Notes (Signed)
Physical Therapy Treatment Patient Details Name: Keith Dixon MRN: [id number] DOB: [birth year] Today's Date: 01/08/2021    History of Present Illness Pt is a 60 y.o. male who presented 12/12/20 with back and R>L leg pain. S/p DLIFs L1-2 L2-3 L34 L4-5 and T10 pelvis decompression and fusion 8/3 and second surgery 8/4. ETT 8/4 - 8/15, re-intubated 8/15. Found to have R lower extremity DVT and left IJ DVT with IVC filter placed 8/6. AKI. S/p tracheostomy 8/16. 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 PMH: PTSD, OSA, HTN, tobacco use, bipolar, degenerative scoliosis, seizures    PT Comments    Pt tolerated 2x sit<>Stand in sara plus today, standing >3 minutes each trial. Pt demonstrates improving standing tolerance and balance during standing interventions today. VSS throughout session, will continue to follow.    Follow Up Recommendations  SNF (would greatly benefit from inpatient rehab if insurance would approve)     Equipment Recommendations  Wheelchair (measurements PT);Wheelchair cushion (measurements PT);Hospital bed;Rolling walker with 5" wheels (hoyer lift)    Recommendations for Other Services       Precautions / Restrictions Precautions Precautions: Fall;Back Precaution Comments: spinal precautions, cortrak Required Braces or Orthoses: Spinal Brace Spinal Brace: Thoracolumbosacral orthotic;Applied in supine position Spinal Brace Comments: clamshell Restrictions Weight Bearing Restrictions: No    Mobility  Bed Mobility Overal bed mobility: Needs Assistance Bed Mobility: Rolling;Sidelying to Sit;Sit to Sidelying Rolling: Mod assist;+2 for physical assistance Sidelying to sit: +2 for physical assistance;Max assist       General bed mobility comments: mod-max +2 for rolling bilat for donning clamshell and boost to upright sitting, heavy posterior leaning. Increased time, step-wise  sequencing    Transfers Overall transfer level: Needs assistance Equipment used: Ambulation equipment used Transfers: Sit to/from Stand Sit to Stand: Max assist;+2 physical assistance   Squat pivot transfers: Max assist;+2 physical assistance     General transfer comment: MAx +2 for power up, rise, steady, and hip extension to upright. sit<>stand x2 from EOB and low recliner height, use of sara lift  Ambulation/Gait             General Gait Details: nt   Stairs             Wheelchair Mobility    Modified Rankin (Stroke Patients Only) Modified Rankin (Stroke Patients Only) Pre-Morbid Rankin Score: Moderate disability Modified Rankin: Severe disability     Balance Overall balance assessment: Needs assistance Sitting-balance support: Single extremity supported;Bilateral upper extremity supported;Feet supported Sitting balance-Leahy Scale: Poor Sitting balance - Comments: heavy posterior leaning, requiring min-max assist Postural control: Posterior lean;Left lateral lean Standing balance support: Bilateral upper extremity supported;During functional activity Standing balance-Leahy Scale: Zero Standing balance comment: max +2, sara plus. Standing 2x3 minutes                            Cognition Arousal/Alertness: Awake/alert Behavior During Therapy: WFL for tasks assessed/performed Overall Cognitive Status: Impaired/Different from baseline Area of Impairment: Memory;Attention;Following commands;Problem solving                   Current Attention Level: Sustained Memory: Decreased short-term memory;Decreased recall of precautions Following Commands: Follows one step commands consistently;Follows multi-step commands with increased time     Problem Solving: Slow processing;Decreased initiation;Difficulty sequencing;Requires verbal cues;Requires tactile cues General Comments: Pt did not recall having back surgery, stating "I already had it?".  OT explained sequence of  events to pt, including CVAs, and pt expresses understanding stating "that all makes sense". Pt motivated, follows all commands well but requires increased time and cuing for mobility.      Exercises Other Exercises Other Exercises: Standing intervention: SL alternating UE reaching forward and laterally bilat, toe raises, upright posture with posterior pelvic and chest facilitation    General Comments General comments (skin integrity, edema, etc.): vss      Pertinent Vitals/Pain Pain Assessment: Faces Pain Score: 2  Faces Pain Scale: Hurts even more Pain Location: back Pain Descriptors / Indicators: Discomfort;Grimacing Pain Intervention(s): Monitored during session;Limited activity within patient's tolerance;Repositioned    Home Living                      Prior Function            PT Goals (current goals can now be found in the care plan section) Acute Rehab PT Goals Patient Stated Goal: watching Soaps on TV PT Goal Formulation: With patient Time For Goal Achievement: 01/10/21 Potential to Achieve Goals: Fair Progress towards PT goals: Progressing toward goals    Frequency    Min 5X/week      PT Plan Current plan remains appropriate    Co-evaluation PT/OT/SLP Co-Evaluation/Treatment: Yes Reason for Co-Treatment: Complexity of the patient's impairments (multi-system involvement);For patient/therapist safety;To address functional/ADL transfers PT goals addressed during session: Balance;Mobility/safety with mobility;Strengthening/ROM OT goals addressed during session: ADL's and self-care;Proper use of Adaptive equipment and DME;Strengthening/ROM      AM-PAC PT "6 Clicks" Mobility   Outcome Measure  Help needed turning from your back to your side while in a flat bed without using bedrails?: A Lot Help needed moving from lying on your back to sitting on the side of a flat bed without using bedrails?: Total Help needed moving to  and from a bed to a chair (including a wheelchair)?: Total Help needed standing up from a chair using your arms (e.g., wheelchair or bedside chair)?: Total Help needed to walk in hospital room?: Total Help needed climbing 3-5 steps with a railing? : Total 6 Click Score: 7    End of Session Equipment Utilized During Treatment: Back brace Activity Tolerance: Patient limited by fatigue Patient left: with call bell/phone within reach;in chair;with chair alarm set;Other (comment) (maxisky pad under pt) Nurse Communication: Mobility status PT Visit Diagnosis: Muscle weakness (generalized) (M62.81);Difficulty in walking, not elsewhere classified (R26.2);Other symptoms and signs involving the nervous system (R29.898);Hemiplegia and hemiparesis;Pain;Unsteadiness on feet (R26.81) Hemiplegia - Right/Left: Right Hemiplegia - dominant/non-dominant: Dominant Hemiplegia - caused by: Cerebral infarction     Time: [id number] PT Time Calculation (min) (ACUTE ONLY): 33 min  Charges:  $Therapeutic Activity: 8-22 mins                     Stacie Glaze, PT DPT Acute Rehabilitation Services Pager [phone]  Office [phone]    Roxine Caddy E Ruffin Pyo 01/08/2021, 3:08 PM

## 2021-01-09 LAB — RENAL FUNCTION PANEL
Albumin: 2.4 g/dL — ABNORMAL LOW (ref 3.5–5.0)
Anion gap: 5 (ref 5–15)
BUN: 17 mg/dL (ref 6–20)
CO2: 26 mmol/L (ref 22–32)
Calcium: 8.9 mg/dL (ref 8.9–10.3)
Chloride: 108 mmol/L (ref 98–111)
Creatinine, Ser: 1.1 mg/dL (ref 0.61–1.24)
GFR, Estimated: >60 mL/min (ref >60)
Glucose, Bld: 106 mg/dL — ABNORMAL HIGH (ref 70–99)
Phosphorus: 4.4 mg/dL (ref 2.5–4.6)
Potassium: 4 mmol/L (ref 3.5–5.1)
Sodium: 139 mmol/L (ref 135–145)

## 2021-01-09 LAB — BPAM RBC
Blood Product Expiration Date: 202209222359
ISSUE DATE / TIME: 202208300543
Unit Type and Rh: 6200

## 2021-01-09 LAB — TYPE AND SCREEN
ABO/RH(D): A POS
Antibody Screen: NEGATIVE
Unit division: 0

## 2021-01-09 LAB — CBC
HCT: 25 % — ABNORMAL LOW (ref 39.0–52.0)
Hemoglobin: 7.9 g/dL — ABNORMAL LOW (ref 13.0–17.0)
MCH: 27.8 pg (ref 26.0–34.0)
MCHC: 31.6 g/dL (ref 30.0–36.0)
MCV: 88 fL (ref 80.0–100.0)
Platelets: 281 10*3/uL (ref 150–400)
RBC: 2.84 MIL/uL — ABNORMAL LOW (ref 4.22–5.81)
RDW: 17.4 % — ABNORMAL HIGH (ref 11.5–15.5)
WBC: 7.5 10*3/uL (ref 4.0–10.5)
nRBC: 0 % (ref 0.0–0.2)

## 2021-01-09 LAB — GLUCOSE, CAPILLARY
Glucose-Capillary: 103 mg/dL — ABNORMAL HIGH (ref 70–99)
Glucose-Capillary: 104 mg/dL — ABNORMAL HIGH (ref 70–99)
Glucose-Capillary: 105 mg/dL — ABNORMAL HIGH (ref 70–99)
Glucose-Capillary: 108 mg/dL — ABNORMAL HIGH (ref 70–99)
Glucose-Capillary: 114 mg/dL — ABNORMAL HIGH (ref 70–99)
Glucose-Capillary: 117 mg/dL — ABNORMAL HIGH (ref 70–99)

## 2021-01-09 LAB — MAGNESIUM: Magnesium: 1.7 mg/dL (ref 1.7–2.4)

## 2021-01-09 NOTE — Progress Notes (Signed)
Subjective: Patient reports no significant pain, some back pain when mobilizing  Objective: Vital signs in last 24 hours: Temp:  [97.7 F (36.5 C)-98.4 F (36.9 C)] 98 F (36.7 C) (08/31 0911) Pulse Rate:  [86-94] 86 (08/31 0911) Resp:  [17-26] 19 (08/31 0911) BP: (131-172)/(69-94) 166/88 (08/31 0911) SpO2:  [95 %-100 %] 97 % (08/31 0911) Weight:  [111.2 kg] 111.2 kg (08/31 0307)  Intake/Output from previous day: 08/30 0701 - 08/31 0700 In: 315 [Blood:315] Out: 650 [Urine:650] Intake/Output this shift: No intake/output data recorded.  NAD Breathing comfortably Awake, alert, O x 2 FC x 4,  4/5 strength in Les with improved conditioning Dressing c/d, no drainage noted R Flank incision well-healed  Lab Results: Recent Labs    01/08/21 1050 01/09/21 0235  WBC 8.8 7.5  HGB 7.7* 7.9*  HCT 24.4* 25.0*  PLT 302 281   BMET Recent Labs    01/08/21 0249 01/09/21 0235  NA 138 139  K 3.6 4.0  CL 106 108  CO2 25 26  GLUCOSE 114* 106*  BUN 22* 17  CREATININE 1.09 1.10  CALCIUM 8.6* 8.9    Studies/Results: VAS [place] LOWER EXTREMITY VENOUS (DVT)  Result Date: 01/07/2021  Lower Venous DVT Study Patient Name:  Keith Dixon  Date of Exam:   01/07/2021 Medical Rec #: [id number]         Accession #:    [id number] Date of Birth: [birth year]          Patient Gender: M Patient Age:   60 years Exam Location:  [facility] Procedure:      VAS [place] LOWER EXTREMITY VENOUS (DVT) Referring Phys: Duffy Rhody --------------------------------------------------------------------------------  Indications: Follow up DVT.  Comparison Study: 12/14/2020 bilateral lower extremity venous duplex- acute DVT                   right profunda femoral vein, and right gastrocnemius veins,                   acute superficial vein thrombosis right great saphenous vein. Performing Technologist: Maudry Mayhew MHA, RDMS, RVT, RDCS  Examination Guidelines: A complete evaluation includes B-mode imaging,  spectral Doppler, color Doppler, and power Doppler as needed of all accessible portions of each vessel. Bilateral testing is considered an integral part of a complete examination. Limited examinations for reoccurring indications may be performed as noted. The reflux portion of the exam is performed with the patient in reverse Trendelenburg.  +---------+---------------+---------+-----------+----------+--------------+ RIGHT    CompressibilityPhasicitySpontaneityPropertiesThrombus Aging +---------+---------------+---------+-----------+----------+--------------+ CFV      Full           Yes      Yes                                 +---------+---------------+---------+-----------+----------+--------------+ SFJ      Full                                                        +---------+---------------+---------+-----------+----------+--------------+ FV Prox  Full                                                        +---------+---------------+---------+-----------+----------+--------------+  FV Mid   Full                                                        +---------+---------------+---------+-----------+----------+--------------+ FV DistalFull                                                        +---------+---------------+---------+-----------+----------+--------------+ PFV      Full                                                        +---------+---------------+---------+-----------+----------+--------------+ POP      Full           Yes      Yes                                 +---------+---------------+---------+-----------+----------+--------------+ PTV      Full                                                        +---------+---------------+---------+-----------+----------+--------------+ PERO     Full                                                        +---------+---------------+---------+-----------+----------+--------------+ GSV       Full                                                        +---------+---------------+---------+-----------+----------+--------------+   +---------+---------------+---------+-----------+----------+--------------+ LEFT     CompressibilityPhasicitySpontaneityPropertiesThrombus Aging +---------+---------------+---------+-----------+----------+--------------+ CFV      Full           Yes      Yes                                 +---------+---------------+---------+-----------+----------+--------------+ SFJ      Full                                                        +---------+---------------+---------+-----------+----------+--------------+ FV Prox  Full                                                        +---------+---------------+---------+-----------+----------+--------------+  FV Mid   Full                                                        +---------+---------------+---------+-----------+----------+--------------+ FV DistalFull                                                        +---------+---------------+---------+-----------+----------+--------------+ PFV      Full                                                        +---------+---------------+---------+-----------+----------+--------------+ POP      Full           Yes      Yes                                 +---------+---------------+---------+-----------+----------+--------------+ PTV      Full                                                        +---------+---------------+---------+-----------+----------+--------------+ PERO     Full                                                        +---------+---------------+---------+-----------+----------+--------------+     Summary: RIGHT: - Findings suggest resolution of previously noted thrombus. - There is no evidence of superficial venous thrombosis. - There is no evidence of deep vein thrombosis in the lower extremity.   - No cystic structure found in the popliteal fossa.  LEFT: - There is no evidence of deep vein thrombosis in the lower extremity.  - No cystic structure found in the popliteal fossa.  *See table(s) above for measurements and observations. Electronically signed by Servando Snare MD on 01/07/2021 at 5:39:09 PM.    Final     Assessment/Plan: S/p T10-pelvis reconstruction - awaiting SNF - cont Eliquis - cont daily labs - continue Keflex for 1 more week  Vallarie Mare 01/09/2021, 10:29 AM

## 2021-01-09 NOTE — Progress Notes (Signed)
Physical Therapy Treatment Patient Details Name: Keith Dixon MRN: [id number] DOB: 08-Mar-1961 Today's Date: 01/09/2021    History of Present Illness Pt is a 60 y.o. male who presented 12/12/20 with back and R>L leg pain. S/p DLIFs L1-2 L2-3 L34 L4-5 and T10 pelvis decompression and fusion 8/3 and second surgery 8/4. ETT 8/4 - 8/15, re-intubated 8/15. Found to have R lower extremity DVT and left IJ DVT with IVC filter placed 8/6. AKI. S/p tracheostomy 8/16. 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 PMH: PTSD, OSA, HTN, tobacco use, bipolar, degenerative scoliosis, seizures.    PT Comments    Pt motivated to progress mobility. Pt requiring mod-max +2 assist for bed mobility, but progressed to standing with min assist +2 and RW. Pt also participated well in short-distance gait training with RW, pt fatiguing quickly with bilat knees buckling so would benefit from continued +2 for chair follow.     Follow Up Recommendations  SNF (would greatly benefit from inpatient rehab if insurance would approve)     Equipment Recommendations  Wheelchair (measurements PT);Wheelchair cushion (measurements PT);Hospital bed;Rolling walker with 5" wheels (hoyer lift)    Recommendations for Other Services       Precautions / Restrictions Precautions Precautions: Fall;Back Precaution Comments: spinal precautions Required Braces or Orthoses: Spinal Brace Spinal Brace: Thoracolumbosacral orthotic;Applied in supine position Spinal Brace Comments: clamshell Restrictions Weight Bearing Restrictions: No    Mobility  Bed Mobility Overal bed mobility: Needs Assistance Bed Mobility: Sidelying to Sit;Sit to Sidelying;Rolling Rolling: Mod assist;+2 for safety/equipment Sidelying to sit: +2 for physical assistance;Max assist       General bed mobility comments: mod-max assist for log roll bilat for trunk and LE management, pt  able to initiate and complete ~50% of rolling bilat. truncal and LE assist for moving from sidelying>sit.    Transfers Overall transfer level: Needs assistance Equipment used: Rolling walker (2 wheeled) Transfers: Sit to/from Stand Sit to Stand: Min assist;+2 physical assistance;From elevated surface         General transfer comment: Min +2 for power up, rise, steady from very elevated bed height. STS x2 from EOB.  Ambulation/Gait Ambulation/Gait assistance: Min assist;+2 safety/equipment Gait Distance (Feet): 8 Feet Assistive device: Rolling walker (2 wheeled) Gait Pattern/deviations: Step-through pattern;Decreased stride length;Trunk flexed;Decreased step length - right Gait velocity: decr   General Gait Details: Min assist to steady, close chair follow. Verbal cuing for upright posture, increasing step length R>L. bilat buckling with fatigue   Stairs             Wheelchair Mobility    Modified Rankin (Stroke Patients Only) Modified Rankin (Stroke Patients Only) Pre-Morbid Rankin Score: Slight disability Modified Rankin: Moderately severe disability     Balance Overall balance assessment: Needs assistance Sitting-balance support: Bilateral upper extremity supported;Feet supported Sitting balance-Leahy Scale: Fair Sitting balance - Comments: fair to poor, periods of posterior assist to maintain upright. Pt using UEs on bedrails. Postural control: Posterior lean Standing balance support: Bilateral upper extremity supported;During functional activity Standing balance-Leahy Scale: Poor Standing balance comment: reliant on RW to ambulate                            Cognition Arousal/Alertness: Awake/alert Behavior During Therapy: WFL for tasks assessed/performed Overall Cognitive Status: Impaired/Different from baseline Area of Impairment: Memory;Attention;Following commands;Problem solving  Current Attention Level:  Selective Memory: Decreased short-term memory;Decreased recall of precautions Following Commands: Follows one step commands consistently;Follows multi-step commands with increased time     Problem Solving: Slow processing;Decreased initiation;Difficulty sequencing;Requires verbal cues;Requires tactile cues General Comments: follows all one-step commands, increased processing time.      Exercises General Exercises - Lower Extremity Long Arc Quad: AROM;Both;5 reps;Seated    General Comments General comments (skin integrity, edema, etc.): period of tachycardia up to 150s bpm, returned to 104 bpm in seconds. When doffing front part of clamshell in chair, brace pinched pt's medial RUE with small <1 cm mark noted. PT applied foam dressing and notified RN, pt reports no pain after this.      Pertinent Vitals/Pain Pain Assessment: Faces Faces Pain Scale: Hurts even more Pain Location: back Pain Descriptors / Indicators: Discomfort;Grimacing Pain Intervention(s): Limited activity within patient's tolerance;Monitored during session;Repositioned    Home Living                      Prior Function            PT Goals (current goals can now be found in the care plan section) Acute Rehab PT Goals PT Goal Formulation: With patient Time For Goal Achievement: 01/10/21 Potential to Achieve Goals: Fair Progress towards PT goals: Progressing toward goals    Frequency    Min 5X/week      PT Plan Current plan remains appropriate    Co-evaluation PT/OT/SLP Co-Evaluation/Treatment: Yes Reason for Co-Treatment: For patient/therapist safety;To address functional/ADL transfers PT goals addressed during session: Mobility/safety with mobility;Balance;Proper use of DME;Strengthening/ROM        AM-PAC PT "6 Clicks" Mobility   Outcome Measure  Help needed turning from your back to your side while in a flat bed without using bedrails?: A Lot Help needed moving from lying on your  back to sitting on the side of a flat bed without using bedrails?: A Lot Help needed moving to and from a bed to a chair (including a wheelchair)?: A Lot Help needed standing up from a chair using your arms (e.g., wheelchair or bedside chair)?: A Lot Help needed to walk in hospital room?: A Lot Help needed climbing 3-5 steps with a railing? : Total 6 Click Score: 11    End of Session Equipment Utilized During Treatment: Back brace Activity Tolerance: Patient limited by pain Patient left: with call bell/phone within reach;in chair;with chair alarm set;Other (comment) (maxisky pad under pt) Nurse Communication: Mobility status PT Visit Diagnosis: Muscle weakness (generalized) (M62.81);Difficulty in walking, not elsewhere classified (R26.2);Other symptoms and signs involving the nervous system (R29.898);Hemiplegia and hemiparesis;Pain;Unsteadiness on feet (R26.81) Hemiplegia - Right/Left: Right Hemiplegia - dominant/non-dominant: Dominant Hemiplegia - caused by: Cerebral infarction     Time: [id number] PT Time Calculation (min) (ACUTE ONLY): 37 min  Charges:  $Therapeutic Activity: 8-22 mins                     Stacie Glaze, PT DPT Acute Rehabilitation Services Pager [phone]  Office [phone]    Roxine Caddy E Ruffin Pyo 01/09/2021, 4:15 PM

## 2021-01-09 NOTE — Progress Notes (Signed)
Occupational Therapy Treatment Patient Details Name: Keith Dixon MRN: [id number] DOB: July 29, 1960 Today's Date: 01/09/2021    History of present illness Pt is a 60 y.o. male who presented 12/12/20 with back and R>L leg pain. S/p DLIFs L1-2 L2-3 L34 L4-5 and T10 pelvis decompression and fusion 8/3 and second surgery 8/4. ETT 8/4 - 8/15, re-intubated 8/15. Found to have R lower extremity DVT and left IJ DVT with IVC filter placed 8/6. AKI. S/p tracheostomy 8/16. 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 PMH: PTSD, OSA, HTN, tobacco use, bipolar, degenerative scoliosis, seizures.   OT comments  Co treat with PT to work on functional mobility and donning of clamshell brace. Needs +2 for bed mobility, standing and ambulation - mostly min assist and safety. Patient set up in recliner. Patient able to use right hand to wash face and perform oral care. Cognition continues to be impaired with patient needing cues but pleasant and funny. Continue to recommend rehab at discharge.   Follow Up Recommendations  SNF    Equipment Recommendations  Wheelchair (measurements OT);Wheelchair cushion (measurements OT);3 in 1 bedside commode    Recommendations for Other Services      Precautions / Restrictions Precautions Precautions: Fall;Back Precaution Comments: spinal precautions Required Braces or Orthoses: Spinal Brace Spinal Brace: Thoracolumbosacral orthotic;Applied in supine position Spinal Brace Comments: clamshell Other Brace: tighten more upon sitting pt benefit from rolling R then L with positioning Restrictions Weight Bearing Restrictions: No       Mobility Bed Mobility Overal bed mobility: Needs Assistance Bed Mobility: Sidelying to Sit;Sit to Sidelying;Rolling Rolling: Mod assist;+2 for safety/equipment Sidelying to sit: +2 for physical assistance;Max assist Supine to sit: Total assist;+2 for physical  assistance;+2 for safety/equipment;HOB elevated Sit to supine: +2 for physical assistance;Total assist Sit to sidelying: Max assist;+2 for physical assistance General bed mobility comments: mod-max assist for log roll bilat for trunk and LE management, pt able to initiate and complete ~50% of rolling bilat. truncal and LE assist for moving from sidelying>sit.    Transfers Overall transfer level: Needs assistance Equipment used: Rolling walker (2 wheeled) Transfers: Sit to/from Stand Sit to Stand: Min assist;+2 physical assistance;From elevated surface         General transfer comment: Min +2 for power up, rise, steady from very elevated bed height. STS x2 from EOB.    Balance Overall balance assessment: Needs assistance Sitting-balance support: Single extremity supported Sitting balance-Leahy Scale: Fair Sitting balance - Comments: fair to poor, periods of posterior assist to maintain upright. Pt using UEs on bedrails. clamshell limting forward lean Postural control: Posterior lean Standing balance support: Bilateral upper extremity supported;During functional activity Standing balance-Leahy Scale: Poor Standing balance comment: reliant on RW to ambulate                           ADL either performed or assessed with clinical judgement   ADL Overall ADL's : Needs assistance/impaired     Grooming: Sitting;Set up;Oral care;Wash/dry face Grooming Details (indicate cue type and reason): performed teeth brushing seated in recliner with setup. Patient able to use right hand to brush teeth, left hand to hold basin. Used right to wash face as well.                                     Vision  Vision Assessment?: No apparent visual deficits   Perception     Praxis      Cognition Arousal/Alertness: Awake/alert Behavior During Therapy: WFL for tasks assessed/performed Overall Cognitive Status: Impaired/Different from baseline Area of Impairment:  Memory;Attention;Following commands;Problem solving                   Current Attention Level: Selective Memory: Decreased short-term memory;Decreased recall of precautions Following Commands: Follows one step commands consistently;Follows multi-step commands with increased time     Problem Solving: Slow processing;Decreased initiation;Difficulty sequencing;Requires verbal cues;Requires tactile cues General Comments: follows all one-step commands, increased processing time.        Exercises General Exercises - Lower Extremity Long Arc Quad: AROM;Both;5 reps;Seated   Shoulder Instructions       General Comments period of tachycardia up to 150s bpm, returned to 104 bpm in seconds. When doffing front part of clamshell in chair, brace pinched pt's medial RUE with small <1 cm mark noted. PT applied foam dressing and notified RN, pt reports no pain after this.    Pertinent Vitals/ Pain       Pain Assessment: Faces Faces Pain Scale: Hurts even more Pain Location: back Pain Descriptors / Indicators: Discomfort;Grimacing;Restless Pain Intervention(s): Limited activity within patient's tolerance;Monitored during session;Repositioned  Home Living                                          Prior Functioning/Environment              Frequency  Min 2X/week        Progress Toward Goals  OT Goals(current goals can now be found in the care plan section)  Progress towards OT goals: Progressing toward goals  Acute Rehab OT Goals Patient Stated Goal: get better OT Goal Formulation: With patient Time For Goal Achievement: 01/11/21 Potential to Achieve Goals: Good  Plan Discharge plan needs to be updated    Co-evaluation    PT/OT/SLP Co-Evaluation/Treatment: Yes Reason for Co-Treatment: For patient/therapist safety;To address functional/ADL transfers;Complexity of the patient's impairments (multi-system involvement) PT goals addressed during session:  Mobility/safety with mobility OT goals addressed during session: ADL's and self-care      AM-PAC OT "6 Clicks" Daily Activity     Outcome Measure   Help from another person eating meals?: A Little Help from another person taking care of personal grooming?: A Little Help from another person toileting, which includes using toliet, bedpan, or urinal?: A Lot Help from another person bathing (including washing, rinsing, drying)?: A Lot Help from another person to put on and taking off regular upper body clothing?: A Lot Help from another person to put on and taking off regular lower body clothing?: A Lot 6 Click Score: 14    End of Session Equipment Utilized During Treatment: Gait belt;Back brace  OT Visit Diagnosis: Unsteadiness on feet (R26.81);Muscle weakness (generalized) (M62.81);Pain   Activity Tolerance Patient tolerated treatment well   Patient Left with call bell/phone within reach;Other (comment);in chair;with chair alarm set;with nursing/sitter in room   Nurse Communication Mobility status;Precautions;Other (comment)        [id number] OT Time Calculation (min): 37 min  Charges: OT General Charges $OT Visit: 1 Visit OT Treatments $Self Care/Home Management : 8-22 mins  Derl Barrow, OTR/L [place]  Office [phone] Pager: [place] [facility] 01/09/2021, 4:39 PM

## 2021-01-09 NOTE — TOC Progression Note (Signed)
Transition of Care [facility]) - Progression Note    Patient Details  Name: Keith Dixon MRN: [id number] Date of Birth: 24-Jun-1960  [facility] CM/SW [place], [place] Phone Number: 01/09/2021, 1:42 PM  Clinical Narrative:     CSW spoke with patients son,Chad- he requested to send SNF referrals to [facility] and other SNF in the area. Preferred SNF choice is [facility].  CSW contacted [facility] -requested to review - CSW waiting on determination.   TOC wil continue to follow and assist with discharge planning.  Thurmond Butts, MSW, LCSW Clinical Social Worker    Expected Discharge Plan: Skilled Nursing Facility Barriers to Discharge: SNF Pending bed offer  Expected Discharge Plan and Services Expected Discharge Plan: [place] In-house Referral: Clinical Social Work     Living arrangements for the past 2 months: Single Family Home                                       Social Determinants of Health (SDOH) Interventions    Readmission Risk Interventions No flowsheet data found.

## 2021-01-10 LAB — GLUCOSE, CAPILLARY
Glucose-Capillary: 105 mg/dL — ABNORMAL HIGH (ref 70–99)
Glucose-Capillary: 99 mg/dL (ref 70–99)
Glucose-Capillary: 113 mg/dL — ABNORMAL HIGH (ref 70–99)
Glucose-Capillary: 108 mg/dL — ABNORMAL HIGH (ref 70–99)
Glucose-Capillary: 92 mg/dL (ref 70–99)

## 2021-01-10 LAB — RENAL FUNCTION PANEL
Albumin: 2.9 g/dL — ABNORMAL LOW (ref 3.5–5.0)
Anion gap: 6 (ref 5–15)
BUN: 17 mg/dL (ref 6–20)
CO2: 28 mmol/L (ref 22–32)
Calcium: 9.4 mg/dL (ref 8.9–10.3)
Chloride: 103 mmol/L (ref 98–111)
Creatinine, Ser: 1.16 mg/dL (ref 0.61–1.24)
GFR, Estimated: >60 mL/min (ref >60)
Glucose, Bld: 107 mg/dL — ABNORMAL HIGH (ref 70–99)
Phosphorus: 3.7 mg/dL (ref 2.5–4.6)
Potassium: 3.8 mmol/L (ref 3.5–5.1)
Sodium: 137 mmol/L (ref 135–145)

## 2021-01-10 LAB — CBC
HCT: 28 % — ABNORMAL LOW (ref 39.0–52.0)
Hemoglobin: 8.7 g/dL — ABNORMAL LOW (ref 13.0–17.0)
MCH: 27.7 pg (ref 26.0–34.0)
MCHC: 31.1 g/dL (ref 30.0–36.0)
MCV: 89.2 fL (ref 80.0–100.0)
Platelets: 352 10*3/uL (ref 150–400)
RBC: 3.14 MIL/uL — ABNORMAL LOW (ref 4.22–5.81)
RDW: 17.4 % — ABNORMAL HIGH (ref 11.5–15.5)
WBC: 9.2 10*3/uL (ref 4.0–10.5)
nRBC: 0 % (ref 0.0–0.2)

## 2021-01-10 LAB — MAGNESIUM: Magnesium: 1.7 mg/dL (ref 1.7–2.4)

## 2021-01-10 NOTE — Progress Notes (Signed)
Subjective: Pt stated after soiling the bed this afternoon that he did not feel the urge to urinate nor did he feel himself going once he's started.   Shelbie Proctor, RN

## 2021-01-10 NOTE — Progress Notes (Signed)
Physical Therapy Treatment Patient Details Name: Keith Dixon MRN: [id number] DOB: 01-17-1961 Today's Date: 01/10/2021    History of Present Illness Pt is a 60 y.o. male who presented 12/12/20 with back and R>L leg pain. S/p DLIFs L1-2 L2-3 L34 L4-5 and T10 pelvis decompression and fusion 8/3 and second surgery 8/4. ETT 8/4 - 8/15, re-intubated 8/15. Found to have R lower extremity DVT and left IJ DVT with IVC filter placed 8/6. AKI. S/p tracheostomy 8/16. 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 PMH: PTSD, OSA, HTN, tobacco use, bipolar, degenerative scoliosis, seizures.    PT Comments    Pt soiled in urine upon PT arrival to room, requires multiple rolling bouts for cleaning up and changing sheets. Pt requiring mod assist for bilat rolling, and max assist to transition from sidelying to sit with significant truncal boost required. Pt tolerated short distance gait in room, significant LE weakness in standing and benefits from LE strengthening exercises. PT to continue to follow.     Follow Up Recommendations  SNF (would greatly benefit from inpatient rehab if insurance would approve)     Equipment Recommendations  Wheelchair (measurements PT);Wheelchair cushion (measurements PT);Hospital bed;Rolling walker with 5" wheels (hoyer lift)    Recommendations for Other Services       Precautions / Restrictions Precautions Precautions: Fall;Back Precaution Comments: spinal precautions Required Braces or Orthoses: Spinal Brace Spinal Brace: Thoracolumbosacral orthotic;Applied in supine position Spinal Brace Comments: clamshell    Mobility  Bed Mobility Overal bed mobility: Needs Assistance Bed Mobility: Sidelying to Sit;Sit to Sidelying;Rolling Rolling: Mod assist;+2 for safety/equipment Sidelying to sit: Max assist       General bed mobility comments: mod-max assist for trunk translation, scooting to  EOB.    Transfers Overall transfer level: Needs assistance Equipment used: Rolling walker (2 wheeled) Transfers: Sit to/from Stand Sit to Stand: From elevated surface;Mod assist         General transfer comment: Mod assist for power up, steadying, and holding RW steady.  Ambulation/Gait Ambulation/Gait assistance: Min assist;+2 safety/equipment Gait Distance (Feet): 6 Feet Assistive device: Rolling walker (2 wheeled) Gait Pattern/deviations: Step-through pattern;Decreased stride length;Trunk flexed;Decreased step length - right Gait velocity: decr   General Gait Details: min assist to steady, hold RW, cues for upright posture.   Stairs             Wheelchair Mobility    Modified Rankin (Stroke Patients Only) Modified Rankin (Stroke Patients Only) Pre-Morbid Rankin Score: Slight disability Modified Rankin: Moderately severe disability     Balance Overall balance assessment: Needs assistance Sitting-balance support: Single extremity supported Sitting balance-Leahy Scale: Fair Sitting balance - Comments: fair to poor, heavy posterior bias requiring UE support Postural control: Posterior lean Standing balance support: Bilateral upper extremity supported;During functional activity Standing balance-Leahy Scale: Poor Standing balance comment: reliant on RW to ambulate                            Cognition Arousal/Alertness: Awake/alert Behavior During Therapy: WFL for tasks assessed/performed Overall Cognitive Status: Impaired/Different from baseline Area of Impairment: Memory;Attention;Following commands;Problem solving                   Current Attention Level: Selective Memory: Decreased short-term memory;Decreased recall of precautions Following Commands: Follows one step commands consistently;Follows multi-step commands with increased time     Problem Solving: Slow processing;Decreased initiation;Difficulty sequencing;Requires verbal  cues;Requires tactile cues  Exercises General Exercises - Lower Extremity Long Arc Quad: AROM;Both;10 reps;Seated    General Comments        Pertinent Vitals/Pain Pain Assessment: Faces Faces Pain Scale: Hurts little more Pain Location: back Pain Descriptors / Indicators: Discomfort Pain Intervention(s): Limited activity within patient's tolerance;Monitored during session;Repositioned    Home Living                      Prior Function            PT Goals (current goals can now be found in the care plan section) Acute Rehab PT Goals Patient Stated Goal: get better PT Goal Formulation: With patient Time For Goal Achievement: 01/24/21 Potential to Achieve Goals: Fair Progress towards PT goals: Progressing toward goals    Frequency    Min 5X/week      PT Plan Current plan remains appropriate    Co-evaluation              AM-PAC PT "6 Clicks" Mobility   Outcome Measure  Help needed turning from your back to your side while in a flat bed without using bedrails?: A Lot Help needed moving from lying on your back to sitting on the side of a flat bed without using bedrails?: A Lot Help needed moving to and from a bed to a chair (including a wheelchair)?: A Lot Help needed standing up from a chair using your arms (e.g., wheelchair or bedside chair)?: A Lot Help needed to walk in hospital room?: A Lot Help needed climbing 3-5 steps with a railing? : Total 6 Click Score: 11    End of Session Equipment Utilized During Treatment: Back brace Activity Tolerance: Patient tolerated treatment well Patient left: with call bell/phone within reach;in chair;with chair alarm set Nurse Communication: Mobility status;Need for lift equipment (possible lift back to bed) PT Visit Diagnosis: Muscle weakness (generalized) (M62.81);Difficulty in walking, not elsewhere classified (R26.2);Other symptoms and signs involving the nervous system (R29.898);Hemiplegia and  hemiparesis;Pain;Unsteadiness on feet (R26.81) Hemiplegia - Right/Left: Right Hemiplegia - dominant/non-dominant: Dominant Hemiplegia - caused by: Cerebral infarction     Time: [id number] PT Time Calculation (min) (ACUTE ONLY): 24 min  Charges:  $Therapeutic Activity: 23-37 mins                     Stacie Glaze, PT DPT Acute Rehabilitation Services Pager [phone]  Office [phone]    Roxine Caddy E Ruffin Pyo 01/10/2021, 4:58 PM

## 2021-01-10 NOTE — Progress Notes (Signed)
Subjective: Patient reports pain well controlled  Objective: Vital signs in last 24 hours: Temp:  [97.8 F (36.6 C)-98.1 F (36.7 C)] 98 F (36.7 C) (09/01 0725) Pulse Rate:  [85-96] 90 (09/01 0725) Resp:  [17-20] 17 (09/01 0725) BP: (148-171)/(83-95) 150/83 (09/01 0725) SpO2:  [94 %-99 %] 99 % (09/01 0725) Weight:  [106.4 kg] 106.4 kg (09/01 0509)  Intake/Output from previous day: 08/31 0701 - 09/01 0700 In: -  Out: 1700 [Urine:1700] Intake/Output this shift: No intake/output data recorded.  NAD Ox2 4/5 strength in Les, conditioning improving Back dressing c/d  Lab Results: Recent Labs    01/09/21 0235 01/10/21 0625  WBC 7.5 9.2  HGB 7.9* 8.7*  HCT 25.0* 28.0*  PLT 281 352   BMET Recent Labs    01/09/21 0235 01/10/21 0625  NA 139 137  K 4.0 3.8  CL 108 103  CO2 26 28  GLUCOSE 106* 107*  BUN 17 17  CREATININE 1.10 1.16  CALCIUM 8.9 9.4    Studies/Results: No results found.  Assessment/Plan: S/p T10-pelvis reconstruction - awaiting SNF - d/c condom cath - Encourage PO   Vallarie Mare 01/10/2021, 8:46 AM

## 2021-01-10 NOTE — Progress Notes (Signed)
  Speech Language Pathology Treatment: Cognitive-Linquistic  Patient Details Name: Keith Dixon MRN: [id number] DOB: 01/10/1961 Today's Date: 01/10/2021 Time: [id number] SLP Time Calculation (min) (ACUTE ONLY): 24 min  Assessment / Plan / Recommendation Clinical Impression       Pt presented with nausea/vomiting earlier this date and kindly declined PO intake for dysphagia tx, but denies any difficulty with consuming current diet and prefers Dysphagia 3/thin liquids d/t missing dentition for ease of intake.              Discussed compensatory strategies for speech/memory with pt able to use teach back to communicate strategies to SLP with min verbal cues utilized during session.  Pt oriented x4 with use of environmental cues (ie: calendar, white board) and feels that this has improved overall.  Vocal quality improved during simple conversation with good vocal intensity and precise articulation noted throughout conversational tasks.  Intelligibility judged to be 95% accurate overall, which has improved since last session.  Pt stated his cognitive abilities were "60/40% overall" specifically with memory.  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  Pt in agreement with goals of care and desires to continue ST at home with home health.  ST will continue to f/u while in acute setting for above deficits.    HPI HPI: Pt is a 60 y.o. male who presented 12/12/20 with back and R>L leg pain. S/p DLIFs L1-2 L2-3 L34 L4-5 and T10 pelvis decompression and fusion 8/3 and second surgery 8/4. ETT 8/4 - 8/15, re-intubated 8/15. Found to have R lower extremity DVT and left IJ DVT with IVC filter placed 8/6. AKI. S/p tracheostomy 8/16 and decannulated 8/20. 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 PMH:  PTSD, OSA, HTN, tobacco use, bipolar, degenerative scoliosis, seizures      SLP Plan  Continue with current plan of care       Recommendations  Diet recommendations: Dysphagia 3 (mechanical soft);Thin liquid Liquids provided via: Cup Medication Administration: Whole meds with liquid Supervision: Patient able to self feed;Intermittent supervision to cue for compensatory strategies Compensations: Slow rate;Small sips/bites                Follow up Recommendations: Home health SLP SLP Visit Diagnosis: Cognitive communication deficit [id number]) Plan: Continue with current plan of care                       Elvina Sidle, M.S., [place] 01/10/2021, 1:29 PM

## 2021-01-10 NOTE — Progress Notes (Addendum)
Nutrition Follow-up  DOCUMENTATION CODES:  Not applicable  INTERVENTION:  Continue current diet as order per SLP, encourage PO intake Continue Ensure Enlive po TID, each supplement provides 350 kcal and 20 grams of protein MVI with minerals daily   NUTRITION DIAGNOSIS:  Increased nutrient needs related to wound healing as evidenced by estimated needs. - Ongoing, being addressed via diet advancement, nocturnal TF, and oral nutrition supplements  GOAL:  Patient will meet greater than or equal to 90% of their needs - Progressing  MONITOR:  PO intake, Labs, Supplement acceptance, Diet advancement  REASON FOR ASSESSMENT:  Consult Enteral/tube feeding initiation and management  ASSESSMENT:  Pt with PMH of anxiety, PTSD, bipolar 1 disorder, depression, HTN, Sz, sleep apnea, and a smoker who was admitted for severe scoliosis with multi-level listhesis.  8/03 - s/p L1-2, L2-3, L3-4, L4-5, DLIFs 8/04 - T10-pelvis (10 procedure) 8/07 - started on CRRT 8/12 - CRRT stopped, tunneled HD catheter placed, Cortrak placed with tip gastric 8/15 - extubated and re-intubated later that day 8/16 - s/p trach 8/20 - trach decannulation secondary to malpositioned tracheostomy 8/22 - diet advanced to dysphagia 3 with thin liquids, HD catheter removed 8/23 - transitioned to nocturnal TF 8/30 - cortrak removed, TF stopped  Pt continues with DYS 3 diet. MD discontinued nocturnal TF 8/30 and cortrak tube was removed. Supplements also were adjusted to Ensure Enlive TID versus Nepro BID. Pt is routinely accepting supplements, messaged RN to inquire about intake of meals and supplements, confirms an average intake of ~50% of meals and consuming ensure.   Last meal (9/1) documented at 100% consumed and overall, average intake does appear to be improving but remains fair. Will continue to monitor and see if intakes continue to trend up. Weight appears overall stable this admission.    Per neurology, pt is  medically stable for dc, awaiting SNF placement for rehab for discharge.   Average Meal Intake: 8/22-8/25: 30% intake x 3 recorded meals (10-60%) 8/26-9/1: 45% intake x 4 recorded meals (0-100%)  Nutritionally Relevant Medications: Scheduled Meds:  cephALEXin  250 mg Oral Q8H   cholecalciferol  2,000 Units Oral BID   feeding supplement  237 mL Oral TID BM   folic acid  1 mg Oral Daily   pantoprazole  40 mg Oral QHS   vitamin B-12  1,000 mcg Oral Daily   zinc sulfate  220 mg Oral Daily   PRN Meds: docusate, ondansetron, polyethylene glycol, sennosides  Labs Reviewed   Intake/Output Summary (Last 24 hours) at 01/10/2021 1801 Last data filed at 01/10/2021 1144 Gross per 24 hour  Intake 240 ml  Output 425 ml  Net -185 ml  Net IO Since Admission: 17,538.01 mL [01/10/21 1801]  Diet Order:   Diet Order             DIET DYS 3 Room service appropriate? Yes with Assist; Fluid consistency: Thin  Diet effective now                   EDUCATION NEEDS:  No education needs have been identified at this time  Skin:  Skin Assessment: Skin Integrity Issues: DTI: L heel Other: previous trach site  Last BM:  9/1  Height:  Ht Readings from Last 1 Encounters:  12/24/20 6' (1.829 m)    Weight:  Wt Readings from Last 1 Encounters:  01/10/21 106.4 kg    BMI:  Body mass index is 31.81 kg/m.  Estimated Nutritional Needs:  Kcal:  2200-2500 Protein:  110-120 g/d Fluid:  >2 L/day   Ranell Patrick, RD, LDN Clinical Dietitian Pager on [place]

## 2021-01-10 NOTE — TOC Progression Note (Signed)
Transition of Care [facility]) - Progression Note    Patient Details  Name: Keith Dixon MRN: [id number] Date of Birth: 1960/08/10  [facility] CM/SW Contact  Emeterio Reeve, [place] Phone Number: 01/10/2021, 3:06 PM  Clinical Narrative:     PT was accepted by [facility] in the [place] but they do not accepts pts insurance. There are no other offers in the hub at this time.   Expected Discharge Plan: Skilled Nursing Facility Barriers to Discharge: SNF Pending bed offer  Expected Discharge Plan and Services Expected Discharge Plan: [place] In-house Referral: Clinical Social Work     Living arrangements for the past 2 months: Single Family Home                                       Social Determinants of Health (SDOH) Interventions    Readmission Risk Interventions No flowsheet data found.

## 2021-01-11 LAB — RENAL FUNCTION PANEL
Albumin: 2.7 g/dL — ABNORMAL LOW (ref 3.5–5.0)
Anion gap: 7 (ref 5–15)
BUN: 17 mg/dL (ref 6–20)
CO2: 25 mmol/L (ref 22–32)
Calcium: 9 mg/dL (ref 8.9–10.3)
Chloride: 102 mmol/L (ref 98–111)
Creatinine, Ser: 1.25 mg/dL — ABNORMAL HIGH (ref 0.61–1.24)
GFR, Estimated: >60 mL/min (ref >60)
Glucose, Bld: 108 mg/dL — ABNORMAL HIGH (ref 70–99)
Phosphorus: 4.6 mg/dL (ref 2.5–4.6)
Potassium: 3.9 mmol/L (ref 3.5–5.1)
Sodium: 134 mmol/L — ABNORMAL LOW (ref 135–145)

## 2021-01-11 LAB — GLUCOSE, CAPILLARY
Glucose-Capillary: 112 mg/dL — ABNORMAL HIGH (ref 70–99)
Glucose-Capillary: 115 mg/dL — ABNORMAL HIGH (ref 70–99)
Glucose-Capillary: 101 mg/dL — ABNORMAL HIGH (ref 70–99)
Glucose-Capillary: 120 mg/dL — ABNORMAL HIGH (ref 70–99)
Glucose-Capillary: 86 mg/dL (ref 70–99)
Glucose-Capillary: 127 mg/dL — ABNORMAL HIGH (ref 70–99)

## 2021-01-11 LAB — CBC
HCT: 26.5 % — ABNORMAL LOW (ref 39.0–52.0)
Hemoglobin: 8.3 g/dL — ABNORMAL LOW (ref 13.0–17.0)
MCH: 27.9 pg (ref 26.0–34.0)
MCHC: 31.3 g/dL (ref 30.0–36.0)
MCV: 88.9 fL (ref 80.0–100.0)
Platelets: 318 10*3/uL (ref 150–400)
RBC: 2.98 MIL/uL — ABNORMAL LOW (ref 4.22–5.81)
RDW: 17.1 % — ABNORMAL HIGH (ref 11.5–15.5)
WBC: 7.9 10*3/uL (ref 4.0–10.5)
nRBC: 0 % (ref 0.0–0.2)

## 2021-01-11 LAB — MAGNESIUM: Magnesium: 1.7 mg/dL (ref 1.7–2.4)

## 2021-01-11 MED ORDER — SODIUM CHLORIDE 0.9 % IV BOLUS
500.0000 mL | Freq: Once | INTRAVENOUS | Status: AC
  Administered 2021-01-11: 500 mL via INTRAVENOUS

## 2021-01-11 NOTE — Progress Notes (Addendum)
Occupational Therapy Treatment Patient Details Name: Keith Dixon MRN: [id number] DOB: [birth year] Today's Date: 01/11/2021    History of present illness Pt is a 60 y.o. male who presented 12/12/20 with back and R>L leg pain. S/p DLIFs L1-2 L2-3 L34 L4-5 and T10 pelvis decompression and fusion 8/3 and second surgery 8/4. ETT 8/4 - 8/15, re-intubated 8/15. Found to have R lower extremity DVT and left IJ DVT with IVC filter placed 8/6. AKI. S/p tracheostomy 8/16. 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 PMH: PTSD, OSA, HTN, tobacco use, bipolar, degenerative scoliosis, seizures.   OT comments  Pt progress from bed to chair this session and demonstrates ambulation >77f this session. Pt fatigues and requires rest break. Pt express feeling dizzy afterward and after two cycles of blood pressure cuff VSS. Recommendation for SNf at this time.   Music choice [employer]  Follow Up Recommendations  SNF    Equipment Recommendations  3 in 1 bedside commode;Other (comment);Wheelchair (measurements OT);Wheelchair cushion (measurements OT);Hospital bed (RW)    Recommendations for Other Services      Precautions / Restrictions Precautions Precautions: Fall;Back Precaution Comments: spinal precautions Required Braces or Orthoses: Spinal Brace Spinal Brace: Thoracolumbosacral orthotic;Applied in supine position Spinal Brace Comments: clamshell       Mobility Bed Mobility Overal bed mobility: Needs Assistance Bed Mobility: Rolling;Sidelying to Sit Rolling: Mod assist;+2 for safety/equipment Sidelying to sit: +2 for physical assistance;Mod assist       General bed mobility comments: +rail, cues for sequencing    Transfers Overall transfer level: Needs assistance Equipment used: Rolling walker (2 wheeled) Transfers: Sit to/from Stand Sit to Stand: +2 physical assistance;Mod assist         General  transfer comment: cues for hand placement and sequencing, assist to power up and stabilize balance    Balance Overall balance assessment: Needs assistance Sitting-balance support: Feet supported;Single extremity supported Sitting balance-Leahy Scale: Fair     Standing balance support: Bilateral upper extremity supported;During functional activity Standing balance-Leahy Scale: Poor                             ADL either performed or assessed with clinical judgement   ADL Overall ADL's : Needs assistance/impaired Eating/Feeding: Set up;Sitting Eating/Feeding Details (indicate cue type and reason): able to self feed lunch                 Lower Body Dressing: Total assistance Lower Body Dressing Details (indicate cue type and reason): dependence for socks Toilet Transfer: +2 for physical assistance;Moderate assistance;RW                   Vision       Perception     Praxis      Cognition Arousal/Alertness: Awake/alert Behavior During Therapy: WFL for tasks assessed/performed Overall Cognitive Status: Impaired/Different from baseline Area of Impairment: Attention;Memory;Problem solving                   Current Attention Level: Selective Memory: Decreased short-term memory;Decreased recall of precautions       Problem Solving: Slow processing;Decreased initiation;Difficulty sequencing;Requires verbal cues;Requires tactile cues General Comments: increased processing time        Exercises     Shoulder Instructions       General Comments VSS    Pertinent Vitals/ Pain       Pain Assessment: Faces  Faces Pain Scale: Hurts little more Pain Location: back Pain Descriptors / Indicators: Grimacing;Discomfort Pain Intervention(s): Monitored during session;Repositioned  Home Living                                          Prior Functioning/Environment              Frequency  Min 2X/week        Progress  Toward Goals  OT Goals(current goals can now be found in the care plan section)  Progress towards OT goals: Progressing toward goals  Acute Rehab OT Goals Patient Stated Goal: to walk again OT Goal Formulation: With patient Time For Goal Achievement: 01/25/21 Potential to Achieve Goals: Good ADL Goals Pt Will Perform Grooming: with modified independence;sitting Additional ADL Goal #1: Pt will complete bed mobility min +2 (A) as precursor to adls. Additional ADL Goal #2: pt will tolerate eob sitting with mod (A) for static sitting balance Additional ADL Goal #3: pt will complete basic transfer total +2 min (A)  Plan Discharge plan remains appropriate    Co-evaluation    PT/OT/SLP Co-Evaluation/Treatment: Yes Reason for Co-Treatment: Necessary to address cognition/behavior during functional activity;For patient/therapist safety;To address functional/ADL transfers PT goals addressed during session: Mobility/safety with mobility;Balance;Proper use of DME OT goals addressed during session: ADL's and self-care;Proper use of Adaptive equipment and DME;Strengthening/ROM      AM-PAC OT "6 Clicks" Daily Activity     Outcome Measure   Help from another person eating meals?: A Little Help from another person taking care of personal grooming?: A Little Help from another person toileting, which includes using toliet, bedpan, or urinal?: A Lot Help from another person bathing (including washing, rinsing, drying)?: A Lot Help from another person to put on and taking off regular upper body clothing?: A Lot Help from another person to put on and taking off regular lower body clothing?: A Lot 6 Click Score: 14    End of Session Equipment Utilized During Treatment: Gait belt;Rolling walker;Back brace  OT Visit Diagnosis: Unsteadiness on feet (R26.81);Muscle weakness (generalized) (M62.81);Pain   Activity Tolerance Patient tolerated treatment well   Patient Left in chair;with call bell/phone  within reach;with chair alarm set   Nurse Communication Mobility status;Precautions        Time: [id number] OT Time Calculation (min): 32 min  Charges: OT General Charges $OT Visit: 1 Visit OT Treatments $Self Care/Home Management : 8-22 mins   Brynn, OTR/L  Acute Rehabilitation Services Pager: [phone] Office: [phone] .    Jeri Modena 01/11/2021, 2:29 PM

## 2021-01-11 NOTE — Progress Notes (Signed)
Subjective: Patient reports pain well-controlled  Objective: Vital signs in last 24 hours: Temp:  [97.5 F (36.4 C)-99.1 F (37.3 C)] 97.5 F (36.4 C) (09/02 0801) Pulse Rate:  [88-97] 97 (09/02 0801) Resp:  [14-18] 17 (09/02 0801) BP: (155-167)/(87-99) 160/93 (09/02 0801) SpO2:  [95 %-99 %] 97 % (09/02 0801) Weight:  [101 kg] 101 kg (09/02 0500)  Intake/Output from previous day: 09/01 0701 - 09/02 0700 In: 1000 [P.O.:1000] Out: 1025 [Urine:1025] Intake/Output this shift: No intake/output data recorded.  NAD Ox2 Dressing c/d Flank incision well-healed 4+/5 strength in LEs  Lab Results: Recent Labs    01/10/21 0625 01/11/21 0248  WBC 9.2 7.9  HGB 8.7* 8.3*  HCT 28.0* 26.5*  PLT 352 318   BMET Recent Labs    01/10/21 0625 01/11/21 0248  NA 137 134*  K 3.8 3.9  CL 103 102  CO2 28 25  GLUCOSE 107* 108*  BUN 17 17  CREATININE 1.16 1.25*  CALCIUM 9.4 9.0    Studies/Results: No results found.  Assessment/Plan: S/p T10-pelvis reconstruction  LOS: 30 days  - awaiting SNF - 500 ml bolus given due to slight rise in Cr over last two days   Keith Dixon 01/11/2021, 8:06 AM

## 2021-01-11 NOTE — Progress Notes (Signed)
Physical Therapy Treatment Patient Details Name: Keith Dixon MRN: [id number] DOB: 18-Jul-1960 Today's Date: 01/11/2021    History of Present Illness Pt is a 60 y.o. male who presented 12/12/20 with back and R>L leg pain. S/p DLIFs L1-2 L2-3 L34 L4-5 and T10 pelvis decompression and fusion 8/3 and second surgery 8/4. ETT 8/4 - 8/15, re-intubated 8/15. Found to have R lower extremity DVT and left IJ DVT with IVC filter placed 8/6. AKI. S/p tracheostomy 8/16. 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 PMH: PTSD, OSA, HTN, tobacco use, bipolar, degenerative scoliosis, seizures.    PT Comments    Pt progressing well with mobility. He required +2 mod assist bed mobility, +2 mod assist transfers, and +2 mod assist ambulation 5' +10' with RW. Seated rest break between gait trials. Pt in recliner at end of session.    Follow Up Recommendations  SNF (would greatly benefit from inpatient rehab if insurance would approve)     Equipment Recommendations  Wheelchair (measurements PT);Wheelchair cushion (measurements PT);Hospital bed;Rolling walker with 5" wheels    Recommendations for Other Services       Precautions / Restrictions Precautions Precautions: Fall;Back Required Braces or Orthoses: Spinal Brace Spinal Brace: Thoracolumbosacral orthotic;Applied in supine position Spinal Brace Comments: clamshell    Mobility  Bed Mobility Overal bed mobility: Needs Assistance Bed Mobility: Rolling;Sidelying to Sit Rolling: Mod assist;+2 for safety/equipment Sidelying to sit: +2 for physical assistance;Mod assist       General bed mobility comments: +rail, cues for sequencing    Transfers Overall transfer level: Needs assistance Equipment used: Rolling walker (2 wheeled) Transfers: Sit to/from Stand Sit to Stand: +2 physical assistance;Mod assist         General transfer comment: cues for hand placement and  sequencing, assist to power up and stabilize balance  Ambulation/Gait Ambulation/Gait assistance: +2 physical assistance;+2 safety/equipment;Mod assist Gait Distance (Feet): 10 Feet (+ 5) Assistive device: Rolling walker (2 wheeled) Gait Pattern/deviations: Step-through pattern;Decreased stride length;Trunk flexed;Decreased step length - right Gait velocity: decreased Gait velocity interpretation: <1.8 ft/sec, indicate of risk for recurrent falls General Gait Details: assist to stabilize balance and manage RW, cues for sequencing and posture. Attempted 3rd gait trial but pt too fatigued.   Stairs             Wheelchair Mobility    Modified Rankin (Stroke Patients Only) Modified Rankin (Stroke Patients Only) Pre-Morbid Rankin Score: Slight disability Modified Rankin: Moderately severe disability     Balance Overall balance assessment: Needs assistance Sitting-balance support: Feet supported;Single extremity supported Sitting balance-Leahy Scale: Fair     Standing balance support: Bilateral upper extremity supported;During functional activity Standing balance-Leahy Scale: Poor                              Cognition Arousal/Alertness: Awake/alert Behavior During Therapy: WFL for tasks assessed/performed Overall Cognitive Status: Impaired/Different from baseline Area of Impairment: Attention;Memory;Problem solving                   Current Attention Level: Selective Memory: Decreased short-term memory;Decreased recall of precautions       Problem Solving: Slow processing;Decreased initiation;Difficulty sequencing;Requires verbal cues;Requires tactile cues General Comments: increased processing time      Exercises      General Comments General comments (skin integrity, edema, etc.): VSS      Pertinent Vitals/Pain Pain Assessment: Faces Faces Pain Scale: Hurts  little more Pain Location: back Pain Descriptors / Indicators:  Grimacing;Discomfort Pain Intervention(s): Repositioned;Monitored during session    Home Living                      Prior Function            PT Goals (current goals can now be found in the care plan section) Acute Rehab PT Goals Patient Stated Goal: get better Progress towards PT goals: Progressing toward goals    Frequency    Min 5X/week      PT Plan Current plan remains appropriate    Co-evaluation PT/OT/SLP Co-Evaluation/Treatment: Yes Reason for Co-Treatment: Complexity of the patient's impairments (multi-system involvement);For patient/therapist safety;To address functional/ADL transfers PT goals addressed during session: Mobility/safety with mobility;Balance;Proper use of DME        AM-PAC PT "6 Clicks" Mobility   Outcome Measure  Help needed turning from your back to your side while in a flat bed without using bedrails?: A Lot Help needed moving from lying on your back to sitting on the side of a flat bed without using bedrails?: A Lot Help needed moving to and from a bed to a chair (including a wheelchair)?: A Lot Help needed standing up from a chair using your arms (e.g., wheelchair or bedside chair)?: A Lot Help needed to walk in hospital room?: Total Help needed climbing 3-5 steps with a railing? : Total 6 Click Score: 10    End of Session Equipment Utilized During Treatment: Back brace;Gait belt Activity Tolerance: Patient tolerated treatment well Patient left: in chair;with call bell/phone within reach Nurse Communication: Mobility status;Need for lift equipment (stedy for return to bed) PT Visit Diagnosis: Muscle weakness (generalized) (M62.81);Difficulty in walking, not elsewhere classified (R26.2);Other symptoms and signs involving the nervous system (R29.898);Hemiplegia and hemiparesis;Pain;Unsteadiness on feet (R26.81) Hemiplegia - Right/Left: Right Hemiplegia - dominant/non-dominant: Dominant Hemiplegia - caused by: Cerebral  infarction     Time: 1124-1200 PT Time Calculation (min) (ACUTE ONLY): 36 min  Charges:  $Gait Training: 8-22 mins                     Lorrin Goodell, PT  Office # [phone] Pager [phone]    [name] 01/11/2021, 1:49 PM

## 2021-01-11 NOTE — Plan of Care (Signed)

## 2021-01-12 LAB — RENAL FUNCTION PANEL
Albumin: 2.7 g/dL — ABNORMAL LOW (ref 3.5–5.0)
Anion gap: 8 (ref 5–15)
BUN: 18 mg/dL (ref 6–20)
CO2: 24 mmol/L (ref 22–32)
Calcium: 9.1 mg/dL (ref 8.9–10.3)
Chloride: 103 mmol/L (ref 98–111)
Creatinine, Ser: 1.25 mg/dL — ABNORMAL HIGH (ref 0.61–1.24)
GFR, Estimated: >60 mL/min (ref >60)
Glucose, Bld: 98 mg/dL (ref 70–99)
Phosphorus: 4 mg/dL (ref 2.5–4.6)
Potassium: 4.3 mmol/L (ref 3.5–5.1)
Sodium: 135 mmol/L (ref 135–145)

## 2021-01-12 LAB — CBC
HCT: 27.8 % — ABNORMAL LOW (ref 39.0–52.0)
Hemoglobin: 8.8 g/dL — ABNORMAL LOW (ref 13.0–17.0)
MCH: 27.7 pg (ref 26.0–34.0)
MCHC: 31.7 g/dL (ref 30.0–36.0)
MCV: 87.4 fL (ref 80.0–100.0)
Platelets: 285 10*3/uL (ref 150–400)
RBC: 3.18 MIL/uL — ABNORMAL LOW (ref 4.22–5.81)
RDW: 17 % — ABNORMAL HIGH (ref 11.5–15.5)
WBC: 7.5 10*3/uL (ref 4.0–10.5)
nRBC: 0 % (ref 0.0–0.2)

## 2021-01-12 LAB — GLUCOSE, CAPILLARY
Glucose-Capillary: 107 mg/dL — ABNORMAL HIGH (ref 70–99)
Glucose-Capillary: 109 mg/dL — ABNORMAL HIGH (ref 70–99)
Glucose-Capillary: 112 mg/dL — ABNORMAL HIGH (ref 70–99)
Glucose-Capillary: 113 mg/dL — ABNORMAL HIGH (ref 70–99)
Glucose-Capillary: 88 mg/dL (ref 70–99)
Glucose-Capillary: 92 mg/dL (ref 70–99)
Glucose-Capillary: 99 mg/dL (ref 70–99)

## 2021-01-12 LAB — MAGNESIUM: Magnesium: 1.6 mg/dL — ABNORMAL LOW (ref 1.7–2.4)

## 2021-01-12 NOTE — Progress Notes (Signed)
Subjective: The patient is alert and pleasant.  He is in no apparent distress.  Objective: Vital signs in last 24 hours: Temp:  [98 F (36.7 C)-99 F (37.2 C)] 98 F (36.7 C) (09/03 0723) Pulse Rate:  [88-97] 94 (09/03 0723) Resp:  [16-20] 20 (09/03 0723) BP: (152-168)/(72-109) 168/72 (09/03 0723) SpO2:  [95 %-99 %] 99 % (09/03 0425) Estimated body mass index is 30.2 kg/m as calculated from the following:   Height as of this encounter: 6' (1.829 m).   Weight as of this encounter: 101 kg.   Intake/Output from previous day: 09/02 0701 - 09/03 0700 In: 0  Out: 1200 [Urine:1200] Intake/Output this shift: No intake/output data recorded.  Physical exam the patient is alert and pleasant.  He moves his lower extremities well.  Lab Results: Recent Labs    01/11/21 0248 01/12/21 0025  WBC 7.9 7.5  HGB 8.3* 8.8*  HCT 26.5* 27.8*  PLT 318 285   BMET Recent Labs    01/11/21 0248 01/12/21 0025  NA 134* 135  K 3.9 4.3  CL 102 103  CO2 25 24  GLUCOSE 108* 98  BUN 17 18  CREATININE 1.25* 1.25*  CALCIUM 9.0 9.1    Studies/Results: No results found.  Assessment/Plan: Postop day #31: We are awaiting skilled nursing facility placement.  LOS: 31 days     Ophelia Charter 01/12/2021, 10:23 AM     Patient ID: Keith Dixon, male   DOB: 1960/11/11, 60 y.o.   MRN: [id number]

## 2021-01-13 LAB — CBC
HCT: 29.2 % — ABNORMAL LOW (ref 39.0–52.0)
Hemoglobin: 9.1 g/dL — ABNORMAL LOW (ref 13.0–17.0)
MCH: 27.6 pg (ref 26.0–34.0)
MCHC: 31.2 g/dL (ref 30.0–36.0)
MCV: 88.5 fL (ref 80.0–100.0)
Platelets: 289 10*3/uL (ref 150–400)
RBC: 3.3 MIL/uL — ABNORMAL LOW (ref 4.22–5.81)
RDW: 16.9 % — ABNORMAL HIGH (ref 11.5–15.5)
WBC: 6.3 10*3/uL (ref 4.0–10.5)
nRBC: 0 % (ref 0.0–0.2)

## 2021-01-13 LAB — RENAL FUNCTION PANEL
Albumin: 2.9 g/dL — ABNORMAL LOW (ref 3.5–5.0)
Anion gap: 7 (ref 5–15)
BUN: 19 mg/dL (ref 6–20)
CO2: 24 mmol/L (ref 22–32)
Calcium: 9.6 mg/dL (ref 8.9–10.3)
Chloride: 105 mmol/L (ref 98–111)
Creatinine, Ser: 1.15 mg/dL (ref 0.61–1.24)
GFR, Estimated: >60 mL/min (ref >60)
Glucose, Bld: 105 mg/dL — ABNORMAL HIGH (ref 70–99)
Phosphorus: 4.3 mg/dL (ref 2.5–4.6)
Potassium: 4.5 mmol/L (ref 3.5–5.1)
Sodium: 136 mmol/L (ref 135–145)

## 2021-01-13 LAB — MAGNESIUM: Magnesium: 1.8 mg/dL (ref 1.7–2.4)

## 2021-01-13 LAB — GLUCOSE, CAPILLARY
Glucose-Capillary: 120 mg/dL — ABNORMAL HIGH (ref 70–99)
Glucose-Capillary: 94 mg/dL (ref 70–99)
Glucose-Capillary: 106 mg/dL — ABNORMAL HIGH (ref 70–99)

## 2021-01-13 NOTE — Progress Notes (Signed)
Subjective: Patient reports that he is pleased with his postoperative status and is appreciative of the care he has received. He has appropriate incisional pain that is well controled. No acute events overnight.   Objective: Vital signs in last 24 hours: Temp:  [97.6 F (36.4 C)-99.4 F (37.4 C)] 98 F (36.7 C) (09/04 0727) Pulse Rate:  [80-106] 94 (09/04 0727) Resp:  [16-20] 18 (09/04 0727) BP: (120-180)/(71-110) 144/88 (09/04 0727) SpO2:  [93 %-100 %] 100 % (09/04 0727) Weight:  [98.1 kg] 98.1 kg (09/04 0500)  Intake/Output from previous day: 09/03 0701 - 09/04 0700 In: 240 [P.O.:240] Out: 1400 [Urine:1400] Intake/Output this shift: No intake/output data recorded.  Physical Exam: Patient is awake, alert, conversant, and in good spirits. They are in NAD and VSS. Doing well. Speech is fluent and appropriate. MAEW. BLE 4/5. PERLA, EOMI. CNs grossly intact. Dressing is clean dry intact.    Lab Results: Recent Labs    01/12/21 0025 01/13/21 0212  WBC 7.5 6.3  HGB 8.8* 9.1*  HCT 27.8* 29.2*  PLT 285 289   BMET Recent Labs    01/12/21 0025 01/13/21 0212  NA 135 136  K 4.3 4.5  CL 103 105  CO2 24 24  GLUCOSE 98 105*  BUN 18 19  CREATININE 1.25* 1.15  CALCIUM 9.1 9.6    Studies/Results: No results found.  Assessment/Plan: Patient is s/p T10-pelvis reconstruction. He is continuing to recover well. He has appropriate postoperative pain. Continue working on pain control, mobility and ambulating patient. Encourage PO intake. Awaiting SNF placement.     LOS: 32 days     Marvis Moeller, DNP, NP-C 01/13/2021, 8:29 AM

## 2021-01-14 LAB — MAGNESIUM: Magnesium: 1.9 mg/dL (ref 1.7–2.4)

## 2021-01-14 LAB — CBC
HCT: 28.3 % — ABNORMAL LOW (ref 39.0–52.0)
Hemoglobin: 8.9 g/dL — ABNORMAL LOW (ref 13.0–17.0)
MCH: 27.6 pg (ref 26.0–34.0)
MCHC: 31.4 g/dL (ref 30.0–36.0)
MCV: 87.6 fL (ref 80.0–100.0)
Platelets: 318 10*3/uL (ref 150–400)
RBC: 3.23 MIL/uL — ABNORMAL LOW (ref 4.22–5.81)
RDW: 16.5 % — ABNORMAL HIGH (ref 11.5–15.5)
WBC: 8.9 10*3/uL (ref 4.0–10.5)
nRBC: 0 % (ref 0.0–0.2)

## 2021-01-14 LAB — RENAL FUNCTION PANEL
Albumin: 2.9 g/dL — ABNORMAL LOW (ref 3.5–5.0)
Anion gap: 7 (ref 5–15)
BUN: 18 mg/dL (ref 6–20)
CO2: 26 mmol/L (ref 22–32)
Calcium: 9.6 mg/dL (ref 8.9–10.3)
Chloride: 102 mmol/L (ref 98–111)
Creatinine, Ser: 1.13 mg/dL (ref 0.61–1.24)
GFR, Estimated: >60 mL/min (ref >60)
Glucose, Bld: 116 mg/dL — ABNORMAL HIGH (ref 70–99)
Phosphorus: 3.9 mg/dL (ref 2.5–4.6)
Potassium: 4.5 mmol/L (ref 3.5–5.1)
Sodium: 135 mmol/L (ref 135–145)

## 2021-01-14 MED ORDER — POLYETHYLENE GLYCOL 3350 17 G PO PACK: 17.0000 g | PACK | Freq: Every day | ORAL | Status: DC

## 2021-01-14 MED ORDER — DOCUSATE SODIUM 50 MG/5ML PO LIQD: 100.0000 mg | Freq: Two times a day (BID) | ORAL | Status: DC | PRN

## 2021-01-14 MED ORDER — POLYETHYLENE GLYCOL 3350 17 G PO PACK
17.0000 g | PACK | Freq: Every day | ORAL | Status: DC | PRN
  Administered 2021-01-28 – 2021-01-29 (×2): 17 g via ORAL
  Filled 2021-01-14 (×2): qty 1

## 2021-01-14 MED ORDER — DOCUSATE SODIUM 50 MG/5ML PO LIQD: 100.0000 mg | Freq: Two times a day (BID) | ORAL | Status: DC

## 2021-01-14 NOTE — Progress Notes (Signed)
  Speech Language Pathology Treatment: Dysphagia;Cognitive-Linquistic  Patient Details Name: Keith Dixon MRN: [id number] DOB: 1961/01/14 Today's Date: 01/14/2021 Time: [id number] SLP Time Calculation (min) (ACUTE ONLY): 19 min  Assessment / Plan / Recommendation Clinical Impression  Pt was seen for swallowing and cognitive therapy. Pt reported no difficulty with feeding or swallowing at mealtimes. Overall, pt presents with increased strength and vocal loudness compared to previous sessions. SLP observed pt with thin, Dys3, and puree consistencies. Pt showed no signs of oral phase impairment and no s/s of aspiration. Pt requested to continue with Dys3/thin diet to aid in feeding while strength in arms returns. SLP initiated diagnostic therapy activities to determine current level of memory and problem solving function. Pt reports his family members helps with memory of important events at baseline but is able to state the schedule of his upcoming preferred TV programs. Pt problem solving intact at the functional basic level. SLP to d/c swallowing goals but will continue to follow for higher level cognitive therapy.     HPI HPI: Pt is a 60 y.o. male who presented 12/12/20 with back and R>L leg pain. S/p DLIFs L1-2 L2-3 L34 L4-5 and T10 pelvis decompression and fusion 8/3 and second surgery 8/4. ETT 8/4 - 8/15, re-intubated 8/15. Found to have R lower extremity DVT and left IJ DVT with IVC filter placed 8/6. AKI. S/p tracheostomy 8/16 and decannulated 8/20. 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 PMH: PTSD, OSA, HTN, tobacco use, bipolar, degenerative scoliosis, seizures      SLP Plan  Continue with current plan of care (signed off for swallow)       Recommendations  Diet recommendations: Dysphagia 3 (mechanical soft);Thin liquid Liquids provided via: Straw Medication Administration: Whole meds with  liquid Supervision: Patient able to self feed Postural Changes and/or Swallow Maneuvers: Seated upright 90 degrees;Upright 30-60 min after meal                Oral Care Recommendations: Oral care BID Follow up Recommendations: Skilled Nursing facility;24 hour supervision/assistance SLP Visit Diagnosis: Cognitive communication deficit (R41.841);Dysphagia, unspecified (R13.10) Plan: Continue with current plan of care (signed off for swallow)       GO              Dewitt Rota, SLP-Student   Dewitt Rota 01/14/2021, 1:30 PM

## 2021-01-14 NOTE — Progress Notes (Addendum)
Subjective: Patient reports that he is doing well overall. No acute events overnight.  Objective: Vital signs in last 24 hours: Temp:  [98 F (36.7 C)-99.1 F (37.3 C)] 98 F (36.7 C) (09/05 0753) Pulse Rate:  [89-98] 89 (09/05 0753) Resp:  [15-18] 15 (09/05 0753) BP: (129-162)/(75-107) 152/89 (09/05 0753) SpO2:  [98 %-100 %] 98 % (09/05 0753) Weight:  [98.6 kg] 98.6 kg (09/05 0542)  Intake/Output from previous day: 09/04 0701 - 09/05 0700 In: -  Out: [id number] [Urine:1050] Intake/Output this shift: No intake/output data recorded.  Physical Exam: Patient is awake, alert, conversant, and in good spirits. They are in NAD and VSS. Doing well. Speech is fluent and appropriate. MAEW. BLE 4/5. PERLA, EOMI. CNs grossly intact. Dressing is clean dry intact.   Lab Results: Recent Labs    01/13/21 0212 01/14/21 0253  WBC 6.3 8.9  HGB 9.1* 8.9*  HCT 29.2* 28.3*  PLT 289 318   BMET Recent Labs    01/12/21 0025 01/13/21 0212  NA 135 136  K 4.3 4.5  CL 103 105  CO2 24 24  GLUCOSE 98 105*  BUN 18 19  CREATININE 1.25* 1.15  CALCIUM 9.1 9.6    Studies/Results: No results found.  Assessment/Plan: Patient is s/p T10-pelvis reconstruction. He is doing well. Appropriate postoperative pain is well controlled. Continue working on pain control, mobility and ambulating patient. Encourage PO intake. Awaiting SNF placement.   LOS: 33 days     Marvis Moeller, DNP, NP-C 01/14/2021, 9:51 AM

## 2021-01-14 NOTE — TOC Progression Note (Signed)
Transition of Care [facility]) - Progression Note    Patient Details  Name: Keith Dixon MRN: [id number] Date of Birth: 03/09/1961  Transition of Care [facility]) CM/SW [place],  Phone Number: 01/14/2021, 12:07 PM  Clinical Narrative:     CSW met with pt to clarify medicare coverage. He confirms that he has current coverage. CSW called medicare to confirm. Unable to use automated service and no representatives available due to holiday.   Expected Discharge Plan: Skilled Nursing Facility Barriers to Discharge: SNF Pending bed offer  Expected Discharge Plan and Services Expected Discharge Plan: [place] In-house Referral: Clinical Social Work     Living arrangements for the past 2 months: Single Family Home                                       Social Determinants of Health (SDOH) Interventions    Readmission Risk Interventions No flowsheet data found.

## 2021-01-14 NOTE — Progress Notes (Signed)
Physical Therapy Treatment Patient Details Name: Keith Dixon MRN: [id number] DOB: [birth year] Today's Date: 01/14/2021    History of Present Illness Pt is a 60 y.o. male who presented 12/12/20 with back and R>L leg pain. S/p DLIFs L1-2 L2-3 L34 L4-5 and T10 pelvis decompression and fusion 8/3 and second surgery 8/4. ETT 8/4 - 8/15, re-intubated 8/15. Found to have R lower extremity DVT and left IJ DVT with IVC filter placed 8/6. AKI. S/p tracheostomy 8/16. 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 PMH: PTSD, OSA, HTN, tobacco use, bipolar, degenerative scoliosis, seizures.    PT Comments    Pt continues to have significant posterior bias in sitting, required two person assist to stand and pivot to the recliner chair with RW (RN assisting in transfer). Pt had ataxic steps when moving feet.  Pt would benefit from continued therapy.     Follow Up Recommendations  SNF     Equipment Recommendations  Wheelchair (measurements PT);Wheelchair cushion (measurements PT);Hospital bed;Rolling walker with 5" wheels;3in1 (PT)    Recommendations for Other Services       Precautions / Restrictions Precautions Precautions: Fall;Back Precaution Comments: spinal precautions Required Braces or Orthoses: Spinal Brace Spinal Brace: Thoracolumbosacral orthotic;Applied in sitting position (12/13/20 order says sitting) Spinal Brace Comments: clamshell    Mobility  Bed Mobility Overal bed mobility: Needs Assistance Bed Mobility: Rolling;Sidelying to Sit Rolling: Min assist Sidelying to sit: Mod assist       General bed mobility comments: cues to bend knees for log roll technique.  Support at trunk to come up to sitting EOB, posterior preference once sitting up    Transfers Overall transfer level: Needs assistance Equipment used: Rolling walker (2 wheeled) Transfers: Sit to/from [facility] Sit to Stand:  +2 physical assistance;Mod assist Stand pivot transfers: +2 physical assistance;Mod assist       General transfer comment: Two person mod assist to stand and take pivotal steps to the recliner chair over weak and ataxic legs.  Ambulation/Gait             General Gait Details: Did not attempt today as he could not hold himself up sitting EOB.  Might be best to have 3 people, one on each side and one to follow with chair for safety during gait.   Stairs             Wheelchair Mobility    Modified Rankin (Stroke Patients Only) Modified Rankin (Stroke Patients Only) Pre-Morbid Rankin Score: Slight disability Modified Rankin: Moderately severe disability     Balance Overall balance assessment: Needs assistance Sitting-balance support: Feet supported;Bilateral upper extremity supported Sitting balance-Leahy Scale: Poor Sitting balance - Comments: mod assist in sitting EOB, posterior bias Postural control: Posterior lean Standing balance support: Bilateral upper extremity supported Standing balance-Leahy Scale: Poor Standing balance comment: two person mod assist to stand with support of RW.                            Cognition Arousal/Alertness: Awake/alert Behavior During Therapy: WFL for tasks assessed/performed Overall Cognitive Status: No family/caregiver present to determine baseline cognitive functioning                                 General Comments: Not specifically tested, conversation normal.      Exercises General Exercises - Lower Extremity Long  Arc Quad: AROM;Both;10 reps Toe Raises: AROM;Both;20 reps Heel Raises: AROM;Both;20 reps    General Comments General comments (skin integrity, edema, etc.): Pt able to apply lotion to bil upper thighs, PT assisting with lower legs and feet.      Pertinent Vitals/Pain Pain Assessment: Faces Faces Pain Scale: Hurts even more Pain Location: back Pain Descriptors / Indicators:  Grimacing;Discomfort Pain Intervention(s): Limited activity within patient's tolerance;Monitored during session;Repositioned    Home Living                      Prior Function            PT Goals (current goals can now be found in the care plan section) Acute Rehab PT Goals Patient Stated Goal: to walk again Progress towards PT goals: Progressing toward goals    Frequency    Min 5X/week      PT Plan Current plan remains appropriate    Co-evaluation              AM-PAC PT "6 Clicks" Mobility   Outcome Measure  Help needed turning from your back to your side while in a flat bed without using bedrails?: A Little Help needed moving from lying on your back to sitting on the side of a flat bed without using bedrails?: A Lot Help needed moving to and from a bed to a chair (including a wheelchair)?: Total Help needed standing up from a chair using your arms (e.g., wheelchair or bedside chair)?: Total Help needed to walk in hospital room?: Total Help needed climbing 3-5 steps with a railing? : Total 6 Click Score: 9    End of Session Equipment Utilized During Treatment: Back brace Activity Tolerance: Patient limited by fatigue Patient left: in chair;with call bell/phone within reach;with nursing/sitter in room   PT Visit Diagnosis: Muscle weakness (generalized) (M62.81);Difficulty in walking, not elsewhere classified (R26.2);Other symptoms and signs involving the nervous system (R29.898);Hemiplegia and hemiparesis;Pain;Unsteadiness on feet (R26.81) Hemiplegia - Right/Left: Right Hemiplegia - dominant/non-dominant: Dominant Hemiplegia - caused by: Cerebral infarction Pain - Right/Left:  (incisional) Pain - part of body:  (back)     Time: 1236-1300 PT Time Calculation (min) (ACUTE ONLY): 24 min  Charges:  $Therapeutic Activity: 23-37 mins                     Verdene Lennert, PT, DPT  Acute Rehabilitation Ortho Tech Supervisor [phone] pager [phone])  [phone] office

## 2021-01-14 NOTE — TOC Progression Note (Signed)
Transition of Care [facility] - Progression Note    Patient Details  Name: KAIKOA CHEA MRN: [id number] Date of Birth: 09-Dec-1960  Transition of Care [facility]) CM/SW [place], [place] Phone Number: 01/14/2021, 10:25 AM  Clinical Narrative:     Patient has no bed offers. CSW contacted [facility] to see if any male beds will be available this week. CSW is informed that [facility] leadership will be meeting tomorrow regarding bed availability and would have update then.  CSW will continue SNF bed search  Expected Discharge Plan: [place] Barriers to Discharge: SNF Pending bed offer  Expected Discharge Plan and Services Expected Discharge Plan: [place] In-house Referral: Clinical Social Work     Living arrangements for the past 2 months: Single Family Home                                       Social Determinants of Health (SDOH) Interventions    Readmission Risk Interventions No flowsheet data found.

## 2021-01-15 LAB — CBC
HCT: 30.3 % — ABNORMAL LOW (ref 39.0–52.0)
Hemoglobin: 9.7 g/dL — ABNORMAL LOW (ref 13.0–17.0)
MCH: 28 pg (ref 26.0–34.0)
MCHC: 32 g/dL (ref 30.0–36.0)
MCV: 87.3 fL (ref 80.0–100.0)
Platelets: 320 10*3/uL (ref 150–400)
RBC: 3.47 MIL/uL — ABNORMAL LOW (ref 4.22–5.81)
RDW: 16.7 % — ABNORMAL HIGH (ref 11.5–15.5)
WBC: 11.3 10*3/uL — ABNORMAL HIGH (ref 4.0–10.5)
nRBC: 0 % (ref 0.0–0.2)

## 2021-01-15 LAB — RENAL FUNCTION PANEL
Albumin: 3 g/dL — ABNORMAL LOW (ref 3.5–5.0)
Anion gap: 6 (ref 5–15)
BUN: 18 mg/dL (ref 6–20)
CO2: 28 mmol/L (ref 22–32)
Calcium: 9.8 mg/dL (ref 8.9–10.3)
Chloride: 101 mmol/L (ref 98–111)
Creatinine, Ser: 1.21 mg/dL (ref 0.61–1.24)
GFR, Estimated: >60 mL/min (ref >60)
Glucose, Bld: 108 mg/dL — ABNORMAL HIGH (ref 70–99)
Phosphorus: 4.1 mg/dL (ref 2.5–4.6)
Potassium: 4.5 mmol/L (ref 3.5–5.1)
Sodium: 135 mmol/L (ref 135–145)

## 2021-01-15 LAB — MAGNESIUM: Magnesium: 1.9 mg/dL (ref 1.7–2.4)

## 2021-01-15 NOTE — Progress Notes (Signed)
Subjective: Patient has made good progress with PT, ambulated longer distances  Objective: Vital signs in last 24 hours: Temp:  [98.1 F (36.7 C)-99.2 F (37.3 C)] 98.1 F (36.7 C) (09/06 0750) Pulse Rate:  [86-103] 86 (09/06 0750) Resp:  [16-20] 16 (09/06 0750) BP: (103-159)/(85-95) 157/86 (09/06 0750) SpO2:  [97 %-100 %] 100 % (09/06 0750) Weight:  [95.8 kg] 95.8 kg (09/06 0500)  Intake/Output from previous day: 09/05 0701 - 09/06 0700 In: 700 [P.O.:700] Out: 1200 [Urine:1200] Intake/Output this shift: No intake/output data recorded. NAD A+Ox3 Still deconditioned but 4+/5 strength in legs Back Dressing c/d Flank incision well-healed  Lab Results: Recent Labs    01/14/21 0253 01/15/21 0129  WBC 8.9 11.3*  HGB 8.9* 9.7*  HCT 28.3* 30.3*  PLT 318 320   BMET Recent Labs    01/14/21 1032 01/15/21 0129  NA 135 135  K 4.5 4.5  CL 102 101  CO2 26 28  GLUCOSE 116* 108*  BUN 18 18  CREATININE 1.13 1.21  CALCIUM 9.6 9.8    Studies/Results: No results found.  Assessment/Plan: S/p T10-pelvis fusion - awaiting SNF - cont nutritional supplementation  Vallarie Mare 01/15/2021, 12:02 PM

## 2021-01-15 NOTE — TOC Progression Note (Signed)
Transition of Care [facility]) - Progression Note    Patient Details  Name: Keith Dixon MRN: [id number] Date of Birth: 1961-04-15  Transition of Care [facility]) CM/SW [place], [place] Phone Number: 01/15/2021, 3:50 PM  Clinical Narrative:     Nanine Means reviewing- will give CSW call tomorrow   Received PSARR # [id number] F 01/15/2021- 03/16/2021  Thurmond Butts, MSW, LCSW Clinical Social Worker    Expected Discharge Plan: Skilled Nursing Facility Barriers to Discharge: SNF Pending bed offer  Expected Discharge Plan and Services Expected Discharge Plan: [place] In-house Referral: Clinical Social Work     Living arrangements for the past 2 months: Single Family Home                                       Social Determinants of Health (SDOH) Interventions    Readmission Risk Interventions No flowsheet data found.

## 2021-01-15 NOTE — Progress Notes (Signed)
Occupational Therapy Treatment Patient Details Name: Keith Dixon MRN: [id number] DOB: 1960-10-21 Today's Date: 01/15/2021    History of present illness Pt is a 60 y.o. male who presented 12/12/20 with back and R>L leg pain. S/p DLIFs L1-2 L2-3 L34 L4-5 and T10 pelvis decompression and fusion 8/3 and second surgery 8/4. ETT 8/4 - 8/15, re-intubated 8/15. Found to have R lower extremity DVT and left IJ DVT with IVC filter placed 8/6. AKI. S/p tracheostomy 8/16. 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 PMH: PTSD, OSA, HTN, tobacco use, bipolar, degenerative scoliosis, seizures.   OT comments  Patient supine in bed and agreeable to OT/PT session, highly motivated for mobility and reporting "Im getting better every day". Pt completing transfers with mod assist +2, continues to require significant assist to power up from EOB or recliner with cueing for hand placement and technique (due to preference to posterior bias).  Once standing pivots to recliner with min guard +2 for safety using RW to simulate toilet transfer. Requires total assist +2 for LB dressing, min assist for UB dressing.  Cueing for spinal precautions and posture throughout session, total assist for brace mgmt.  Will continue to follow.    Follow Up Recommendations  SNF    Equipment Recommendations  3 in 1 bedside commode;Other (comment);Wheelchair (measurements OT);Wheelchair cushion (measurements OT);Hospital bed (RW)    Recommendations for Other Services      Precautions / Restrictions Precautions Precautions: Fall;Back Precaution Booklet Issued: No Precaution Comments: spinal precautions Required Braces or Orthoses: Spinal Brace Spinal Brace: Thoracolumbosacral orthotic;Applied in sitting position (12/13/20 order says sitting) Spinal Brace Comments: clamshell Restrictions Weight Bearing Restrictions: No       Mobility Bed Mobility Overal  bed mobility: Needs Assistance Bed Mobility: Rolling;Sidelying to Sit Rolling: Min guard Sidelying to sit: Mod assist       General bed mobility comments: cueing for technique and precautions, rolling with min guard and mod assist for trunk support to ascend    Transfers Overall transfer level: Needs assistance Equipment used: Rolling walker (2 wheeled) Transfers: Sit to/from [facility] Sit to Stand: Mod assist;+2 physical assistance;+2 safety/equipment Stand pivot transfers: Min guard;+2 safety/equipment;+2 physical assistance       General transfer comment: sit to stand +2 mod assist to power up and steady from EOB and recliner, once in standing pivoting with min guard assist for safety/balance using RW    Balance Overall balance assessment: Needs assistance Sitting-balance support: Feet supported;Bilateral upper extremity supported Sitting balance-Leahy Scale: Poor Sitting balance - Comments: max assist initally with posterior bias but improved to min assist with cueing for weightshifting Postural control: Posterior lean Standing balance support: Bilateral upper extremity supported;During functional activity Standing balance-Leahy Scale: Poor Standing balance comment: relies on BUE and external support                           ADL either performed or assessed with clinical judgement   ADL Overall ADL's : Needs assistance/impaired                 Upper Body Dressing : Minimal assistance;Sitting Upper Body Dressing Details (indicate cue type and reason): to don gowns Lower Body Dressing: Total assistance;+2 for physical assistance;+2 for safety/equipment;Sit to/from stand Lower Body Dressing Details (indicate cue type and reason): total assist for socks, attempting movement towards figure 4 technique but limited by LB weakness/coordination, +2 in standing for  safety Toilet Transfer: Moderate assistance;+2 for safety/equipment;+2 for  physical assistance;Ambulation;RW Toilet Transfer Details (indicate cue type and reason): simulated in room                 Vision       Perception     Praxis      Cognition Arousal/Alertness: Awake/alert Behavior During Therapy: WFL for tasks assessed/performed Overall Cognitive Status: No family/caregiver present to determine baseline cognitive functioning Area of Impairment: Attention;Memory;Problem solving;Awareness;Following commands;Safety/judgement                   Current Attention Level: Selective Memory: Decreased short-term memory;Decreased recall of precautions Following Commands: Follows one step commands consistently;Follows one step commands with increased time;Follows multi-step commands inconsistently Safety/Judgement: Decreased awareness of safety Awareness: Emergent Problem Solving: Slow processing General Comments: normal conversation, requires cueing to adhere to precautions functionally, for safety awareness and problem solving        Exercises     Shoulder Instructions       General Comments fatigues easily    Pertinent Vitals/ Pain       Pain Assessment: Faces Faces Pain Scale: Hurts little more Pain Location: back Pain Descriptors / Indicators: Grimacing;Discomfort Pain Intervention(s): Limited activity within patient's tolerance;Monitored during session;Repositioned  Home Living                                          Prior Functioning/Environment              Frequency  Min 2X/week        Progress Toward Goals  OT Goals(current goals can now be found in the care plan section)  Progress towards OT goals: Progressing toward goals  Acute Rehab OT Goals Patient Stated Goal: to walk again OT Goal Formulation: With patient  Plan Discharge plan remains appropriate;Frequency remains appropriate    Co-evaluation    PT/OT/SLP Co-Evaluation/Treatment: Yes     OT goals addressed during session:  ADL's and self-care      AM-PAC OT "6 Clicks" Daily Activity     Outcome Measure   Help from another person eating meals?: A Little Help from another person taking care of personal grooming?: A Little Help from another person toileting, which includes using toliet, bedpan, or urinal?: A Lot Help from another person bathing (including washing, rinsing, drying)?: A Lot Help from another person to put on and taking off regular upper body clothing?: A Little Help from another person to put on and taking off regular lower body clothing?: A Lot 6 Click Score: 15    End of Session Equipment Utilized During Treatment: Gait belt;Rolling walker;Back brace  OT Visit Diagnosis: Unsteadiness on feet (R26.81);Muscle weakness (generalized) (M62.81);Pain Pain - part of body:  (back)   Activity Tolerance Patient tolerated treatment well   Patient Left in chair;with call bell/phone within reach;with chair alarm set   Nurse Communication Mobility status;Precautions        Time: [id number] OT Time Calculation (min): 25 min  Charges: OT General Charges $OT Visit: 1 Visit OT Treatments $Self Care/Home Management : 8-22 mins  Jolaine Artist, OT Acute Rehabilitation Services Pager [phone] Office [place] 01/15/2021, 12:26 PM

## 2021-01-15 NOTE — Progress Notes (Signed)
Physical Therapy Treatment Patient Details Name: Keith Dixon MRN: [id number] DOB: Nov 04, 1960 Today's Date: 01/15/2021    History of Present Illness Pt is a 60 y.o. male admitted 12/12/20 with back and BLE pain. S/p DLIF L1-L5 and T10-pelvis decompression and fusion 8/3; second stage of sx performed 8/4. Found to have RLE DVT, L IJ DVT; s/p IVC filter placement 8/6. Head CT 8/7 with bilateral cortical, L thalamic, bilateral cerebellar infarcts; suggestive of embolic disease. Brain MRI 8/13 with multiple acute/subacute infarcts in cerebellum,  thalami (L>R), bilateral cerebral hemispheres. Course complicated by AKI. ETT 8/4-8/15; reintubated 8/15. Trach placed 8/16. PMH includes PTSD, HTN, OSA, bipolar, seizures, degenerative scoliosis, tobacco use.   PT Comments    Pt progressing with mobility. Today's session focused on bed mobility, transfer and gait training with RW; pt requiring consistent modA+2 to stand, but demonstrates improved ability to ambulate short distance with less assist. Pt remains limited by generalized weakness, decreased activity tolerance, poor balance strategies/postural reactions, and cognitive impairment. Continue to recommend SNF-level therapies to maximize functional mobility and independence prior to return home.    Follow Up Recommendations  SNF;Supervision for mobility/OOB     Equipment Recommendations  Wheelchair (measurements PT);Wheelchair cushion (measurements PT);Hospital bed;Rolling walker with 5" wheels;3in1 (PT)    Recommendations for Other Services       Precautions / Restrictions Precautions Precautions: Fall;Back Precaution Booklet Issued: No Precaution Comments: spinal precautions Required Braces or Orthoses: Spinal Brace Spinal Brace: Thoracolumbosacral orthotic;Applied in sitting position (clamshell TLSO) Spinal Brace Comments: clamshell Restrictions Weight Bearing Restrictions: No    Mobility  Bed Mobility Overal bed mobility: Needs  Assistance Bed Mobility: Rolling;Sidelying to Sit Rolling: Supervision Sidelying to sit: Mod assist       General bed mobility comments: Pt rolling R/L with use of bed rails, verbal cues for technique to maintain spinal precautions (as pt tends to twist low back); pt requiring heavy modA for trunk elevation from sidelying to sit with bed flat, increased time and effort with c/o lower back pain    Transfers Overall transfer level: Needs assistance Equipment used: Rolling walker (2 wheeled) Transfers: Sit to/from Stand Sit to Stand: Mod assist;+2 physical assistance;+2 safety/equipment Stand pivot transfers: Min guard;+2 safety/equipment;+2 physical assistance       General transfer comment: Multiple sit<>stands from EOB and recliner, pt requiring modA+2 for trunk elevation and stability, heavy reliance on BUE support to push/pull into standing; verbal cues for sequencing and technique; modA for eccentric control to sit  Ambulation/Gait Ambulation/Gait assistance: Min guard;Min assist;Mod assist;+2 safety/equipment Gait Distance (Feet): 18 Feet Assistive device: Rolling walker (2 wheeled) Gait Pattern/deviations: Step-through pattern;Decreased stride length;Trunk flexed;Antalgic Gait velocity: Decreased   General Gait Details: Initial pivotal steps to recliner with RW and min guard; additional gait trial with RW, min guard for balance, quick to fatigue requiring minA for stability, up to modA for stability due to BUE/BLE fatigue and noted BLE instability; +2 assist chair follow to allow for seated rest; further distance limited by fatigue   Stairs             Wheelchair Mobility    Modified Rankin (Stroke Patients Only)       Balance Overall balance assessment: Needs assistance Sitting-balance support: Feet supported;Bilateral upper extremity supported Sitting balance-Leahy Scale: Poor Sitting balance - Comments: Initially maxA to maintain static sitting with posterior  bias; progressing to min guard, reliant on BUE support and verbal cues for weight shofts Postural control: Posterior lean Standing balance support: Bilateral upper  extremity supported;During functional activity Standing balance-Leahy Scale: Poor Standing balance comment: Reliant on BUE support                            Cognition Arousal/Alertness: Awake/alert Behavior During Therapy: WFL for tasks assessed/performed Overall Cognitive Status: No family/caregiver present to determine baseline cognitive functioning Area of Impairment: Attention;Memory;Problem solving;Awareness;Following commands;Safety/judgement                   Current Attention Level: Selective Memory: Decreased short-term memory;Decreased recall of precautions Following Commands: Follows one step commands consistently;Follows one step commands with increased time;Follows multi-step commands inconsistently Safety/Judgement: Decreased awareness of safety Awareness: Emergent Problem Solving: Slow processing General Comments: normal conversation, requires cueing to adhere to precautions functionally, for safety awareness and problem solving; increased time for problem solving      Exercises      General Comments General comments (skin integrity, edema, etc.): fatigues easily      Pertinent Vitals/Pain Pain Assessment: Faces Faces Pain Scale: Hurts even more Pain Location: Lower back Pain Descriptors / Indicators: Grimacing;Discomfort;Guarding Pain Intervention(s): Limited activity within patient's tolerance;Monitored during session;Repositioned    Home Living                      Prior Function            PT Goals (current goals can now be found in the care plan section) Acute Rehab PT Goals Patient Stated Goal: to walk again Progress towards PT goals: Progressing toward goals    Frequency    Min 5X/week      PT Plan Current plan remains appropriate    Co-evaluation        OT goals addressed during session: ADL's and self-care      AM-PAC PT "6 Clicks" Mobility   Outcome Measure  Help needed turning from your back to your side while in a flat bed without using bedrails?: A Little Help needed moving from lying on your back to sitting on the side of a flat bed without using bedrails?: A Lot Help needed moving to and from a bed to a chair (including a wheelchair)?: A Lot Help needed standing up from a chair using your arms (e.g., wheelchair or bedside chair)?: Total Help needed to walk in hospital room?: A Lot Help needed climbing 3-5 steps with a railing? : Total 6 Click Score: 11    End of Session Equipment Utilized During Treatment: Back brace Activity Tolerance: Patient tolerated treatment well;Patient limited by fatigue Patient left: in chair;with call bell/phone within reach;with chair alarm set Nurse Communication: Mobility status PT Visit Diagnosis: Muscle weakness (generalized) (M62.81);Difficulty in walking, not elsewhere classified (R26.2);Other symptoms and signs involving the nervous system (R29.898);Hemiplegia and hemiparesis;Pain;Unsteadiness on feet (R26.81)     Time: [id number] PT Time Calculation (min) (ACUTE ONLY): 25 min  Charges:  $Therapeutic Activity: 8-22 mins                     Mabeline Caras, PT, DPT Acute Rehabilitation Services  Pager [phone] Office [place] 01/15/2021, 1:14 PM

## 2021-01-15 NOTE — TOC Progression Note (Addendum)
Transition of Care [facility]) - Progression Note    Patient Details  Name: CARMEL KEMMER MRN: [id number] Date of Birth: 1960-06-03  Transition of Care [facility]) CM/SW [place], [place] Phone Number: 01/15/2021, 10:55 AM  Clinical Narrative:     CSW contacted [facility] [place]- left voice messages to return call.  PSARR remains pending  Thurmond Butts, MSW, LCSW Clinical Social Worker    Expected Discharge Plan: Skilled Nursing Facility Barriers to Discharge: SNF Pending bed offer  Expected Discharge Plan and Services Expected Discharge Plan: [place] In-house Referral: Clinical Social Work     Living arrangements for the past 2 months: Single Family Home                                       Social Determinants of Health (SDOH) Interventions    Readmission Risk Interventions No flowsheet data found.

## 2021-01-15 NOTE — Plan of Care (Signed)
  Problem: Education: Goal: Knowledge of General Education information will improve Description: Including pain rating scale, medication(s)/side effects and non-pharmacologic comfort measures Outcome: Progressing   Problem: Health Behavior/Discharge Planning: Goal: Ability to manage health-related needs will improve Outcome: Progressing   Problem: Clinical Measurements: Goal: Will remain free from infection Outcome: Progressing Goal: Respiratory complications will improve Outcome: Progressing   Problem: Activity: Goal: Risk for activity intolerance will decrease Outcome: Progressing   Problem: Coping: Goal: Level of anxiety will decrease Outcome: Progressing   Problem: Elimination: Goal: Will not experience complications related to urinary retention Outcome: Progressing   Problem: Pain Managment: Goal: General experience of comfort will improve Outcome: Progressing

## 2021-01-16 ENCOUNTER — Inpatient Hospital Stay (HOSPITAL_COMMUNITY): Payer: No Typology Code available for payment source

## 2021-01-16 MED ORDER — ENSURE ENLIVE PO LIQD
237.0000 mL | Freq: Four times a day (QID) | ORAL | Status: DC
  Administered 2021-01-16 – 2021-01-22 (×16): 237 mL via ORAL

## 2021-01-16 MED ORDER — POTASSIUM CHLORIDE IN NACL 20-0.9 MEQ/L-% IV SOLN
INTRAVENOUS | Status: DC
  Filled 2021-01-16 (×4): qty 1000

## 2021-01-16 NOTE — Progress Notes (Signed)
Nutrition Follow-up  DOCUMENTATION CODES:   Not applicable  INTERVENTION:   - Increase Ensure Enlive po to QID, each supplement provides 350 kcal and 20 grams of protein  - Encourage PO intake  - Recommend adding bowel regimen as pt's last documented BM was on 9/01  NUTRITION DIAGNOSIS:   Increased nutrient needs related to wound healing as evidenced by estimated needs.  Ongoing, being addressed via oral nutrition supplements  GOAL:   Patient will meet greater than or equal to 90% of their needs  Progressing  MONITOR:   PO intake, Labs, Supplement acceptance, Diet advancement  REASON FOR ASSESSMENT:   Consult Enteral/tube feeding initiation and management  ASSESSMENT:   Pt with PMH of anxiety, PTSD, bipolar 1 disorder, depression, HTN, Sz, sleep apnea, and a smoker who was admitted for severe scoliosis with multi-level listhesis.  8/03 - s/p L1-2, L2-3, L3-4, L4-5, DLIFs 8/04 - T10-pelvis (10 procedure) 8/07 - started on CRRT 8/12 - CRRT stopped, tunneled HD catheter placed, Cortrak placed with tip gastric 8/15 - extubated and re-intubated later that day 8/16 - s/p trach 8/20 - trach decannulation secondary to malpositioned tracheostomy 8/22 - diet advanced to dysphagia 3 with thin liquids, HD catheter removed 8/23 - transitioned to nocturnal TF 8/30 - cortrak removed, TF discontinued  Spoke with pt at bedside. Pt reports that his appetite is improving. He states that he is eating most of the foods on his meal trays. Pt endorses consuming Ensure oral nutrition supplements. He states that he will drink as many as he is provided. RD to increase Ensure order from TID to QID. Discussed with pt the importance of adequate PO intake in maintaining lean muscle mass and promoting healing. Pt expresses understanding.  Noted pt's last documented BM was on 9/01. Recommend addition of bowel regimen.  Pt reports that the social worker told him that he now has a bed available  at a SNF.  First measured weight on 8/06: 104.9 kg Current weight: 95.8 kg  Meal Completion: 50-100%  Medications reviewed and include: cholecalciferol, Ensure Enlive TID, folic acid, lamictal, protonix, vitamin B-12, zinc sulfate  Labs reviewed: hemoglobin 9.7  UOP: 750 ml x 12 hours  Diet Order:   Diet Order             DIET DYS 3 Room service appropriate? Yes with Assist; Fluid consistency: Thin  Diet effective now                   EDUCATION NEEDS:   No education needs have been identified at this time  Skin:  Skin Assessment: Skin Integrity Issues: DTI: L heel Other: previous trach site  Last BM:  01/10/21  Height:   Ht Readings from Last 1 Encounters:  12/24/20 6' (1.829 m)    Weight:   Wt Readings from Last 1 Encounters:  01/15/21 95.8 kg    BMI:  Body mass index is 28.64 kg/m.  Estimated Nutritional Needs:   Kcal:  2200-2500  Protein:  110-120 g/day  Fluid:  >2 L/day    Keith Bryant, MS, RD, LDN Inpatient Clinical Dietitian Please see AMiON for contact information.

## 2021-01-16 NOTE — Progress Notes (Signed)
Subjective: Patient reports pain is well-controlled.  Objective: Vital signs in last 24 hours: Temp:  [97.5 F (36.4 C)-98.5 F (36.9 C)] 98.5 F (36.9 C) (09/07 0824) Pulse Rate:  [97-100] 97 (09/07 0824) Resp:  [17-19] 19 (09/07 0824) BP: (118-159)/(67-89) 159/86 (09/07 0824) SpO2:  [98 %-100 %] 100 % (09/07 0824)  Intake/Output from previous day: 09/06 0701 - 09/07 0700 In: 500 [P.O.:500] Out: 750 [Urine:750] Intake/Output this shift: No intake/output data recorded.  NAD Awake, alert, Ox3 Strength 4+/5 in Les Pinhole opening in middle of wound, otw well healed.  No active drainage.  Small area of fibrinous exudate in superior portion of wound.   Lab Results: Recent Labs    01/14/21 0253 01/15/21 0129  WBC 8.9 11.3*  HGB 8.9* 9.7*  HCT 28.3* 30.3*  PLT 318 320   BMET Recent Labs    01/14/21 1032 01/15/21 0129  NA 135 135  K 4.5 4.5  CL 102 101  CO2 26 28  GLUCOSE 116* 108*  BUN 18 18  CREATININE 1.13 1.21  CALCIUM 9.6 9.8    Studies/Results: No results found.  Assessment/Plan: S/p T10-pelvis reconstruction - likely d/c to SNF tomorrow - will get CT L-spine today to look for underlying seroma, likely local wound debridement and closure  Keith Dixon 01/16/2021, 11:06 AM

## 2021-01-16 NOTE — Progress Notes (Addendum)
Neurosurgery  I reviewed Keith Dixon's CT scan.  His hardware is in good position.  However, he does have some subcutaneous air, and possibly a subfascial seroma.  Given his persistent drainage from his wound through a small developing sinus tract, exploring the wound in the operating room would be most prudent.  Local debridement, and possible subfascial seroma drainage and drain placement will be the likely plan, with small wound vac placement less likely.  We will keep his surgery smaller in scale as it appears most of his wound has healed well, and he has remained afebrile and the drainage has been serosanguinous.  I discussed the planned procedure with Keith Dixon as well as his caretaker Keith Dixon via phone.  Risks, benefits, alternatives, and expected convalescence were discussed.  All questions and concerns were answered.

## 2021-01-16 NOTE — Progress Notes (Signed)
Physical Therapy Treatment Patient Details Name: Keith Dixon MRN: [id number] DOB: 03-24-1961 Today's Date: 01/16/2021    History of Present Illness Pt is a 60 y.o. male admitted 12/12/20 with back and BLE pain. S/p DLIF L1-L5 and T10-pelvis decompression and fusion 8/3; second stage of sx performed 8/4. Found to have RLE DVT, L IJ DVT; s/p IVC filter placement 8/6. Head CT 8/7 with bilateral cortical, L thalamic, bilateral cerebellar infarcts; suggestive of embolic disease. Brain MRI 8/13 with multiple acute/subacute infarcts in cerebellum,  thalami (L>R), bilateral cerebral hemispheres. Course complicated by AKI. ETT 8/4-8/15; reintubated 8/15. Trach placed 8/16. PMH includes PTSD, HTN, OSA, bipolar, seizures, degenerative scoliosis, tobacco use.    PT Comments    Patient progressing towards physical therapy goals. Patient increased ambulation distance this session to 63' with min guard-minA and RW. Patient continues to demonstrate bilateral LE instability with mobility and increasing with fatigue. Patient unable to recall precautions stating "no one has ever told me those". Educated patient on back precautions and importance of maintaining during mobility. Continue to recommend SNF for ongoing Physical Therapy.       Follow Up Recommendations  SNF;Supervision for mobility/OOB     Equipment Recommendations  Wheelchair (measurements PT);Wheelchair cushion (measurements PT);Hospital bed;Rolling Brieanne Mignone with 5" wheels;3in1 (PT)    Recommendations for Other Services       Precautions / Restrictions Precautions Precautions: Fall;Back Precaution Booklet Issued: No Precaution Comments: spinal precautions; reports "no one has ever told me those" Required Braces or Orthoses: Spinal Brace Spinal Brace: Thoracolumbosacral orthotic;Applied in sitting position (clamshell) Spinal Brace Comments: clamshell Restrictions Weight Bearing Restrictions: No    Mobility  Bed Mobility Overal bed  mobility: Needs Assistance Bed Mobility: Rolling;Sidelying to Sit Rolling: Supervision Sidelying to sit: Mod assist       General bed mobility comments: able to roll towards R with use of bed rails. ModA for turnk elevation and repositioning hips towards EOB. Increased time to complete and complains of low back pain upon sitting    Transfers Overall transfer level: Needs assistance Equipment used: Rolling Kameria Canizares (2 wheeled) Transfers: Sit to/from Stand Sit to Stand: Mod assist;+2 physical assistance;+2 safety/equipment         General transfer comment: modA+2 to stand from low bed surface and recliner. Cues required for hand placement and anterior weight shift  Ambulation/Gait Ambulation/Gait assistance: Min guard;Min assist;+2 safety/equipment Gait Distance (Feet): 50 Feet Assistive device: Rolling Rekisha Welling (2 wheeled) Gait Pattern/deviations: Step-through pattern;Decreased stride length;Trunk flexed;Antalgic Gait velocity: Decreased   General Gait Details: min guard for majority of ambulation but with fatigue minA required for balance. Bilateral LE instability and tremoring noted. Improved ambulation distance this session   Stairs             Wheelchair Mobility    Modified Rankin (Stroke Patients Only) Modified Rankin (Stroke Patients Only) Pre-Morbid Rankin Score: Slight disability Modified Rankin: Moderately severe disability     Balance Overall balance assessment: Needs assistance Sitting-balance support: Feet supported;Bilateral upper extremity supported Sitting balance-Leahy Scale: Poor Sitting balance - Comments: required maxA to maintain static sitting with heavy posterior lean initially. Progressing to min guard over time and with reduction in pain Postural control: Posterior lean Standing balance support: Bilateral upper extremity supported;During functional activity Standing balance-Leahy Scale: Poor Standing balance comment: Reliant on BUE support                             Cognition Arousal/Alertness: Awake/alert  Behavior During Therapy: WFL for tasks assessed/performed Overall Cognitive Status: No family/caregiver present to determine baseline cognitive functioning Area of Impairment: Attention;Memory;Problem solving;Awareness;Following commands;Safety/judgement                   Current Attention Level: Selective Memory: Decreased short-term memory;Decreased recall of precautions Following Commands: Follows one step commands consistently;Follows one step commands with increased time;Follows multi-step commands inconsistently Safety/Judgement: Decreased awareness of safety Awareness: Emergent Problem Solving: Slow processing General Comments: cues required to adhere to precautons. Extensive education provided on precautions due to patient stating "no one has ever told me those". Decreased safety awareness and requires increased time for problem solving      Exercises      General Comments        Pertinent Vitals/Pain Pain Assessment: Faces Faces Pain Scale: Hurts even more Pain Location: Lower back Pain Descriptors / Indicators: Grimacing;Discomfort;Guarding Pain Intervention(s): Monitored during session;Repositioned    Home Living                      Prior Function            PT Goals (current goals can now be found in the care plan section) Acute Rehab PT Goals Patient Stated Goal: to move better PT Goal Formulation: With patient Time For Goal Achievement: 01/24/21 Potential to Achieve Goals: Fair Progress towards PT goals: Progressing toward goals    Frequency    Min 5X/week      PT Plan Current plan remains appropriate    Co-evaluation              AM-PAC PT "6 Clicks" Mobility   Outcome Measure  Help needed turning from your back to your side while in a flat bed without using bedrails?: A Little Help needed moving from lying on your back to sitting on the  side of a flat bed without using bedrails?: A Lot Help needed moving to and from a bed to a chair (including a wheelchair)?: A Lot Help needed standing up from a chair using your arms (e.g., wheelchair or bedside chair)?: Total Help needed to walk in hospital room?: A Lot Help needed climbing 3-5 steps with a railing? : Total 6 Click Score: 11    End of Session Equipment Utilized During Treatment: Gait belt;Back brace Activity Tolerance: Patient tolerated treatment well;Patient limited by fatigue Patient left: in chair;with call bell/phone within reach;with chair alarm set Nurse Communication: Mobility status PT Visit Diagnosis: Muscle weakness (generalized) (M62.81);Difficulty in walking, not elsewhere classified (R26.2);Other symptoms and signs involving the nervous system (R29.898);Hemiplegia and hemiparesis;Pain;Unsteadiness on feet (R26.81) Hemiplegia - Right/Left: Right Hemiplegia - dominant/non-dominant: Dominant Hemiplegia - caused by: Cerebral infarction     Time: 1336-1400 PT Time Calculation (min) (ACUTE ONLY): 24 min  Charges:  $Gait Training: 23-37 mins                     Slayton Lubitz A. [name] PT, DPT Acute Rehabilitation Services Pager [phone] Office [phone]    Linna Hoff 01/16/2021, 2:56 PM

## 2021-01-16 NOTE — TOC Progression Note (Addendum)
Transition of Care [facility]) - Progression Note    Patient Details  Name: Keith Dixon MRN: [id number] Date of Birth: 1961-02-04  [facility]) CM/SW Contact  Vinie Sill, [place] Phone Number: 01/16/2021, 11:31 AM  Clinical Narrative:     Patient has confirmed bed offer w/ [facility]- informed SNF anticipate d/c tomorrow per MD.  CSW updated significant other,[place].  Covid test requested   CSW will continue to follow and assist with discharge planning.   Thurmond Butts, MSW, LCSW Clinical Social Worker    Expected Discharge Plan: Skilled Nursing Facility Barriers to Discharge: SNF Pending bed offer  Expected Discharge Plan and Services Expected Discharge Plan: [place] In-house Referral: Clinical Social Work     Living arrangements for the past 2 months: Single Family Home                                       Social Determinants of Health (SDOH) Interventions    Readmission Risk Interventions No flowsheet data found.

## 2021-01-17 ENCOUNTER — Encounter (HOSPITAL_COMMUNITY)
Admission: RE | Disposition: A | Discharge status: Skilled Nursing Facility | Payer: Self-pay | Source: Home / Self Care | Attending: Neurosurgery

## 2021-01-17 ENCOUNTER — Encounter (HOSPITAL_COMMUNITY): Payer: Self-pay | Admitting: Neurosurgery

## 2021-01-17 ENCOUNTER — Inpatient Hospital Stay (HOSPITAL_COMMUNITY): Payer: No Typology Code available for payment source | Admitting: Anesthesiology

## 2021-01-17 DIAGNOSIS — M48062 Spinal stenosis, lumbar region with neurogenic claudication: Secondary | ICD-10-CM | POA: Diagnosis not present

## 2021-01-17 DIAGNOSIS — G928 Other toxic encephalopathy: Secondary | ICD-10-CM | POA: Diagnosis not present

## 2021-01-17 DIAGNOSIS — N170 Acute kidney failure with tubular necrosis: Secondary | ICD-10-CM | POA: Diagnosis not present

## 2021-01-17 DIAGNOSIS — Z20822 Contact with and (suspected) exposure to covid-19: Secondary | ICD-10-CM | POA: Diagnosis not present

## 2021-01-17 HISTORY — PX: WOUND EXPLORATION: SHX6188

## 2021-01-17 HISTORY — PX: APPLICATION OF WOUND VAC: SHX5189

## 2021-01-17 LAB — TYPE AND SCREEN
ABO/RH(D): A POS
Antibody Screen: NEGATIVE

## 2021-01-17 SURGERY — WOUND EXPLORATION
Anesthesia: General | Site: Back

## 2021-01-17 MED ORDER — FENTANYL CITRATE (PF) 250 MCG/5ML IJ SOLN
INTRAMUSCULAR | Status: AC
  Filled 2021-01-17: qty 5

## 2021-01-17 MED ORDER — PHENOL 1.4 % MT LIQD: 1.0000 | OROMUCOSAL | Status: DC | PRN

## 2021-01-17 MED ORDER — ONDANSETRON HCL 4 MG/2ML IJ SOLN
INTRAMUSCULAR | Status: DC | PRN
  Administered 2021-01-17: 4 mg via INTRAVENOUS

## 2021-01-17 MED ORDER — SODIUM CHLORIDE 0.9% FLUSH
3.0000 mL | Freq: Two times a day (BID) | INTRAVENOUS | Status: DC
  Administered 2021-01-17 – 2021-01-31 (×27): 3 mL via INTRAVENOUS

## 2021-01-17 MED ORDER — ACETAMINOPHEN 10 MG/ML IV SOLN
INTRAVENOUS | Status: AC
  Filled 2021-01-17: qty 100

## 2021-01-17 MED ORDER — AMISULPRIDE (ANTIEMETIC) 5 MG/2ML IV SOLN: 10.0000 mg | Freq: Once | INTRAVENOUS | Status: DC | PRN

## 2021-01-17 MED ORDER — MIDAZOLAM HCL 2 MG/2ML IJ SOLN
INTRAMUSCULAR | Status: AC
  Filled 2021-01-17: qty 2

## 2021-01-17 MED ORDER — SODIUM CHLORIDE 0.9 % IV SOLN: 250.0000 mL | INTRAVENOUS | Status: DC

## 2021-01-17 MED ORDER — LIDOCAINE 2% (20 MG/ML) 5 ML SYRINGE
INTRAMUSCULAR | Status: DC | PRN
  Administered 2021-01-17: 60 mg via INTRAVENOUS

## 2021-01-17 MED ORDER — SUGAMMADEX SODIUM 200 MG/2ML IV SOLN
INTRAVENOUS | Status: DC | PRN
  Administered 2021-01-17: 200 mg via INTRAVENOUS

## 2021-01-17 MED ORDER — 0.9 % SODIUM CHLORIDE (POUR BTL) OPTIME
TOPICAL | Status: DC | PRN
  Administered 2021-01-17: 2000 mL

## 2021-01-17 MED ORDER — PROMETHAZINE HCL 25 MG/ML IJ SOLN: 6.2500 mg | INTRAMUSCULAR | Status: DC | PRN

## 2021-01-17 MED ORDER — MIDAZOLAM HCL 5 MG/5ML IJ SOLN
INTRAMUSCULAR | Status: DC | PRN
  Administered 2021-01-17 (×2): 1 mg via INTRAVENOUS

## 2021-01-17 MED ORDER — ESMOLOL HCL 100 MG/10ML IV SOLN
INTRAVENOUS | Status: DC | PRN
  Administered 2021-01-17 (×2): 20 mg via INTRAVENOUS

## 2021-01-17 MED ORDER — DEXAMETHASONE SODIUM PHOSPHATE 10 MG/ML IJ SOLN
INTRAMUSCULAR | Status: AC
  Filled 2021-01-17: qty 1

## 2021-01-17 MED ORDER — CHLORHEXIDINE GLUCONATE CLOTH 2 % EX PADS
6.0000 | MEDICATED_PAD | Freq: Once | CUTANEOUS | Status: AC
  Administered 2021-01-17: 6 via TOPICAL

## 2021-01-17 MED ORDER — MENTHOL 3 MG MT LOZG: 1.0000 | LOZENGE | OROMUCOSAL | Status: DC | PRN

## 2021-01-17 MED ORDER — ONDANSETRON HCL 4 MG/2ML IJ SOLN
INTRAMUSCULAR | Status: AC
  Filled 2021-01-17: qty 2

## 2021-01-17 MED ORDER — LACTATED RINGERS IV SOLN: INTRAVENOUS | Status: DC

## 2021-01-17 MED ORDER — CHLORHEXIDINE GLUCONATE 0.12 % MT SOLN
15.0000 mL | Freq: Once | OROMUCOSAL | Status: AC
  Administered 2021-01-17: 15 mL via OROMUCOSAL

## 2021-01-17 MED ORDER — CEFAZOLIN SODIUM-DEXTROSE 2-3 GM-%(50ML) IV SOLR
INTRAVENOUS | Status: DC | PRN
  Administered 2021-01-17: 2 g via INTRAVENOUS

## 2021-01-17 MED ORDER — LIDOCAINE 2% (20 MG/ML) 5 ML SYRINGE
INTRAMUSCULAR | Status: AC
  Filled 2021-01-17: qty 5

## 2021-01-17 MED ORDER — DEXAMETHASONE SODIUM PHOSPHATE 4 MG/ML IJ SOLN
INTRAMUSCULAR | Status: DC | PRN
  Administered 2021-01-17: 5 mg via INTRAVENOUS

## 2021-01-17 MED ORDER — HYDROMORPHONE HCL 1 MG/ML IJ SOLN
INTRAMUSCULAR | Status: AC
  Filled 2021-01-17: qty 1

## 2021-01-17 MED ORDER — ENOXAPARIN SODIUM 40 MG/0.4ML IJ SOSY
40.0000 mg | PREFILLED_SYRINGE | INTRAMUSCULAR | Status: DC
  Administered 2021-01-18 – 2021-01-31 (×14): 40 mg via SUBCUTANEOUS
  Filled 2021-01-17 (×14): qty 0.4

## 2021-01-17 MED ORDER — CEFAZOLIN SODIUM 1 G IJ SOLR
INTRAMUSCULAR | Status: AC
  Filled 2021-01-17: qty 20

## 2021-01-17 MED ORDER — ORAL CARE MOUTH RINSE: 15.0000 mL | Freq: Once | OROMUCOSAL | Status: AC

## 2021-01-17 MED ORDER — LABETALOL HCL 5 MG/ML IV SOLN
INTRAVENOUS | Status: AC
  Filled 2021-01-17: qty 4

## 2021-01-17 MED ORDER — PROPOFOL 10 MG/ML IV BOLUS
INTRAVENOUS | Status: AC
  Filled 2021-01-17: qty 20

## 2021-01-17 MED ORDER — ACETAMINOPHEN 160 MG/5ML PO SOLN: 325.0000 mg | Freq: Once | ORAL | Status: DC | PRN

## 2021-01-17 MED ORDER — LABETALOL HCL 5 MG/ML IV SOLN
5.0000 mg | INTRAVENOUS | Status: AC | PRN
  Administered 2021-01-17 (×2): 5 mg via INTRAVENOUS

## 2021-01-17 MED ORDER — MEPERIDINE HCL 25 MG/ML IJ SOLN: 6.2500 mg | INTRAMUSCULAR | Status: DC | PRN

## 2021-01-17 MED ORDER — FENTANYL CITRATE (PF) 100 MCG/2ML IJ SOLN
INTRAMUSCULAR | Status: DC | PRN
  Administered 2021-01-17 (×5): 50 ug via INTRAVENOUS

## 2021-01-17 MED ORDER — HYDROMORPHONE HCL 1 MG/ML IJ SOLN
0.2500 mg | INTRAMUSCULAR | Status: DC | PRN
  Administered 2021-01-17 (×2): 0.5 mg via INTRAVENOUS

## 2021-01-17 MED ORDER — ACETAMINOPHEN 10 MG/ML IV SOLN
1000.0000 mg | Freq: Once | INTRAVENOUS | Status: DC | PRN
  Administered 2021-01-17: 1000 mg via INTRAVENOUS

## 2021-01-17 MED ORDER — ROCURONIUM BROMIDE 100 MG/10ML IV SOLN
INTRAVENOUS | Status: DC | PRN
  Administered 2021-01-17: 60 mg via INTRAVENOUS
  Administered 2021-01-17: 10 mg via INTRAVENOUS

## 2021-01-17 MED ORDER — ROCURONIUM BROMIDE 10 MG/ML (PF) SYRINGE
PREFILLED_SYRINGE | INTRAVENOUS | Status: AC
  Filled 2021-01-17: qty 10

## 2021-01-17 MED ORDER — PROPOFOL 10 MG/ML IV BOLUS
INTRAVENOUS | Status: DC | PRN
  Administered 2021-01-17: 130 mg via INTRAVENOUS

## 2021-01-17 MED ORDER — SODIUM CHLORIDE 0.9 % IV SOLN: INTRAVENOUS | Status: DC

## 2021-01-17 MED ORDER — CEPHALEXIN 250 MG PO CAPS
250.0000 mg | ORAL_CAPSULE | Freq: Three times a day (TID) | ORAL | Status: DC
  Administered 2021-01-18 – 2021-01-21 (×8): 250 mg via ORAL
  Filled 2021-01-17 (×10): qty 1

## 2021-01-17 MED ORDER — SODIUM CHLORIDE 0.9% FLUSH: 3.0000 mL | INTRAVENOUS | Status: DC | PRN

## 2021-01-17 MED ORDER — CEFAZOLIN SODIUM-DEXTROSE 2-4 GM/100ML-% IV SOLN
2.0000 g | Freq: Three times a day (TID) | INTRAVENOUS | Status: AC
  Administered 2021-01-17 – 2021-01-18 (×3): 2 g via INTRAVENOUS
  Filled 2021-01-17 (×3): qty 100

## 2021-01-17 MED ORDER — ACETAMINOPHEN 325 MG PO TABS: 325.0000 mg | ORAL_TABLET | Freq: Once | ORAL | Status: DC | PRN

## 2021-01-17 SURGICAL SUPPLY — 35 items
BAG COUNTER SPONGE SURGICOUNT (BAG) ×3 IMPLANT
BAG SPNG CNTER NS LX DISP (BAG) ×2
CANISTER PREVENA PLUS 150 (CANNISTER) ×1 IMPLANT
CANISTER SUCT 3000ML PPV (MISCELLANEOUS) ×3 IMPLANT
DRAIN CHANNEL 15F RND FF W/TCR (WOUND CARE) ×1 IMPLANT
DRAPE LAPAROTOMY 100X72X124 (DRAPES) ×3 IMPLANT
DRAPE SURG 17X23 STRL (DRAPES) ×3 IMPLANT
DRESSING PEEL AND PLAC PRVNA20 (GAUZE/BANDAGES/DRESSINGS) IMPLANT
DRSG PEEL AND PLACE PREVENA 20 (GAUZE/BANDAGES/DRESSINGS) ×3
DURAPREP 26ML APPLICATOR (WOUND CARE) ×3 IMPLANT
ELECT REM PT RETURN 9FT ADLT (ELECTROSURGICAL) ×3
ELECTRODE REM PT RTRN 9FT ADLT (ELECTROSURGICAL) ×2 IMPLANT
EVACUATOR SILICONE 100CC (DRAIN) ×1 IMPLANT
GAUZE 4X4 16PLY ~~LOC~~+RFID DBL (SPONGE) ×2 IMPLANT
GLOVE SURG LTX SZ7.5 (GLOVE) ×4 IMPLANT
GLOVE SURG UNDER POLY LF SZ7.5 (GLOVE) ×6 IMPLANT
GOWN STRL REUS W/ TWL LRG LVL3 (GOWN DISPOSABLE) ×4 IMPLANT
GOWN STRL REUS W/ TWL XL LVL3 (GOWN DISPOSABLE) IMPLANT
GOWN STRL REUS W/TWL 2XL LVL3 (GOWN DISPOSABLE) IMPLANT
GOWN STRL REUS W/TWL LRG LVL3 (GOWN DISPOSABLE) ×6
GOWN STRL REUS W/TWL XL LVL3 (GOWN DISPOSABLE) ×6
KIT BASIN OR (CUSTOM PROCEDURE TRAY) ×3 IMPLANT
KIT TURNOVER KIT B (KITS) ×3 IMPLANT
NEEDLE HYPO 22GX1.5 SAFETY (NEEDLE) ×2 IMPLANT
NS IRRIG 1000ML POUR BTL (IV SOLUTION) ×4 IMPLANT
PACK LAMINECTOMY NEURO (CUSTOM PROCEDURE TRAY) ×3 IMPLANT
SPONGE T-LAP 4X18 ~~LOC~~+RFID (SPONGE) ×1 IMPLANT
SUT MNCRL AB 4-0 PS2 18 (SUTURE) ×2 IMPLANT
SUT VIC AB 0 CT1 18XCR BRD8 (SUTURE) ×2 IMPLANT
SUT VIC AB 0 CT1 8-18 (SUTURE) ×6
SUT VIC AB 2-0 CP2 18 (SUTURE) ×5 IMPLANT
TOWEL GREEN STERILE (TOWEL DISPOSABLE) ×3 IMPLANT
TOWEL GREEN STERILE FF (TOWEL DISPOSABLE) ×3 IMPLANT
WATER STERILE IRR 1000ML POUR (IV SOLUTION) ×3 IMPLANT
YANKAUER SUCT BULB TIP NO VENT (SUCTIONS) ×1 IMPLANT

## 2021-01-17 NOTE — Progress Notes (Signed)
PT Cancellation Note  Patient Details Name: DEMONTAY BUFALINI MRN: [id number] DOB: 11/12/1960   Cancelled Treatment:    Reason Eval/Treat Not Completed: Patient at procedure or test/unavailable Patient at [place]. PT will re-attempt as time allows.   Ibeth Fahmy A. [name] PT, DPT Acute Rehabilitation Services Pager [phone] Office [phone]    Linna Hoff 01/17/2021, 8:27 AM

## 2021-01-17 NOTE — Progress Notes (Signed)
SLP Cancellation Note  Patient Details Name: Keith Dixon MRN: [id number] DOB: 1961-01-08   Cancelled treatment:       Pt in surgery. Will plan to follow up tomorrow.     [place] Siren 01/17/2021, 8:28 AM   Orbie Pyo Colvin Caroli.Ed Risk analyst [phone] Office [phone]

## 2021-01-17 NOTE — Anesthesia Postprocedure Evaluation (Signed)
Anesthesia Post Note  Patient: Keith Dixon  Procedure(s) Performed: Vertis Kelch WOUND EXPLORATION APPLICATION OF WOUND VAC (Back)     Patient location during evaluation: PACU Anesthesia Type: General Level of consciousness: awake and alert Pain management: pain level controlled Vital Signs Assessment: post-procedure vital signs reviewed and stable Respiratory status: spontaneous breathing, nonlabored ventilation, respiratory function stable and patient connected to nasal cannula oxygen Cardiovascular status: blood pressure returned to baseline and stable Postop Assessment: no apparent nausea or vomiting Anesthetic complications: no   No notable events documented.  Last Vitals:  Vitals:   01/17/21 1324 01/17/21 1606  BP: (!) 162/98 (!) 151/98  Pulse: 87 90  Resp: 16 20  Temp: 36.7 C (!) 36.2 C  SpO2: 97% 95%               Effie Berkshire

## 2021-01-17 NOTE — Transfer of Care (Signed)
Immediate Anesthesia Transfer of Care Note  Patient: Keith Dixon  Procedure(s) Performed: Vertis Kelch WOUND EXPLORATION APPLICATION OF WOUND VAC (Back)  Patient Location: PACU  Anesthesia Type:General  Level of Consciousness: awake  Airway & Oxygen Therapy: Patient Spontanous Breathing and Patient connected to face mask oxygen  Post-op Assessment: Report given to RN and Post -op Vital signs reviewed and stable  Post vital signs: Reviewed and stable  Last Vitals:  Vitals Value Taken Time  BP 180/94 01/17/21 1121  Temp    Pulse 87 01/17/21 1127  Resp 14 01/17/21 1127  SpO2 100 % 01/17/21 1127  Vitals shown include unvalidated device data.  Last Pain:  Vitals:   01/17/21 0836  TempSrc:   PainSc: 0-No pain      Patients Stated Pain Goal: 0 ([id number])  Complications: No notable events documented.

## 2021-01-17 NOTE — Plan of Care (Signed)
  Problem: Education: Goal: Knowledge of General Education information will improve Description: Including pain rating scale, medication(s)/side effects and non-pharmacologic comfort measures Outcome: Progressing   Problem: Health Behavior/Discharge Planning: Goal: Ability to manage health-related needs will improve Outcome: Progressing   Problem: Clinical Measurements: Goal: Ability to maintain clinical measurements within normal limits will improve Outcome: Progressing Goal: Will remain free from infection Outcome: Progressing Goal: Diagnostic test results will improve Outcome: Progressing Goal: Respiratory complications will improve Outcome: Progressing Goal: Cardiovascular complication will be avoided Outcome: Progressing   Problem: Activity: Goal: Risk for activity intolerance will decrease Outcome: Progressing   Problem: Nutrition: Goal: Adequate nutrition will be maintained Outcome: Progressing   Problem: Coping: Goal: Level of anxiety will decrease Outcome: Progressing   Problem: Elimination: Goal: Will not experience complications related to bowel motility Outcome: Progressing Goal: Will not experience complications related to urinary retention Outcome: Progressing   Problem: Pain Managment: Goal: General experience of comfort will improve Outcome: Progressing   Problem: Safety: Goal: Ability to remain free from injury will improve Outcome: Progressing   Problem: Skin Integrity: Goal: Risk for impaired skin integrity will decrease Outcome: Progressing   Problem: Education: Goal: Knowledge of disease or condition will improve Outcome: Progressing Goal: Knowledge of secondary prevention will improve Outcome: Progressing Goal: Knowledge of patient specific risk factors addressed and post discharge goals established will improve Outcome: Progressing Goal: Individualized Educational Video(s) Outcome: Progressing   Problem: Coping: Goal: Will verbalize  positive feelings about self Outcome: Progressing Goal: Will identify appropriate support needs Outcome: Progressing   Problem: Health Behavior/Discharge Planning: Goal: Ability to manage health-related needs will improve Outcome: Progressing   Problem: Self-Care: Goal: Ability to participate in self-care as condition permits will improve Outcome: Progressing Goal: Verbalization of feelings and concerns over difficulty with self-care will improve Outcome: Progressing Goal: Ability to communicate needs accurately will improve Outcome: Progressing   Problem: Nutrition: Goal: Risk of aspiration will decrease Outcome: Progressing Goal: Dietary intake will improve Outcome: Progressing   Problem: Ischemic Stroke/TIA Tissue Perfusion: Goal: Complications of ischemic stroke/TIA will be minimized Outcome: Progressing   Problem: Activity: Goal: Ability to tolerate increased activity will improve Outcome: Progressing   Problem: Respiratory: Goal: Ability to maintain a clear airway and adequate ventilation will improve Outcome: Progressing   Problem: Role Relationship: Goal: Method of communication will improve Outcome: Progressing

## 2021-01-17 NOTE — Progress Notes (Signed)
Neurosurgery  NAEs o/n  NAD FCx4 Dressing in place  Plan for wound exploration, revision today

## 2021-01-17 NOTE — Op Note (Signed)
Procedure(s): THORACOLUMBAR WOUND EXPLORATION APPLICATION OF WOUND VAC Procedure Note  Keith Dixon male 60 y.o. 01/17/2021  Procedure(s) and Anesthesia Type:    * THORACOLUMBAR WOUND EXPLORATION - Choice    * APPLICATION OF WOUND VAC - Choice  Surgeon(s) and Role:    Marcello Moores, Dorcas Carrow, MD - Primary    * Dawley, Theodoro Doing, DO - Assisting   Indications: Keith Dixon is a 60 year old man who underwent a T10 to pelvis reconstructive surgery and had a prolonged hospital course postoperatively during which he developed renal failure, respiratory failure, and DVTs which required anticoagulation.  He recovered well from these medical issues but unsurprisingly, his wound had not fully healed, and there was persistent serosanguineous drainage through his wound.  Due to concern for persistent postoperative seroma and impaired wound healing, wound exploration and revision was recommended to the patient and his family.  Risk, benefits, alternatives, and expected convalescence were discussed.  Risk discussed included, but were not limited to, bleeding, pain, infection, and anesthetic complications.  All questions and concerns were answered and informed consent was obtained.      Surgeon: Vallarie Mare   Assistants: Elwin Sleight, DO  Anesthesia: General endotracheal anesthesia  ASA Class: none    Procedure Detail  THORACOLUMBAR WOUND EXPLORATION, APPLICATION OF WOUND VAC  The patient was brought to the operating room.  After appropriate lines and monitors were placed, general anesthesia was induced and patient was intubated by the anesthesia service.  He was positioned prone on a Jackson table with all pressure points padded and eyes protected.  His periincisional area was cleansed and prepped and draped in sterile fashion.  A timeout was performed.  The small pinhole wound defects were opened sharply with a 10 blade and small subcutaneous seroma cavity was entered.  No purulence was seen.   Under the upper aspect of the healed incision, there was subcutaneous pocket and fascial dehiscence noted.  The subfascial space was explored in this area and fair amount of organizing hematoma with associated seroma was discovered.  This was cultured and evacuated with suction and forceps.  The subfascial space was then irrigated thoroughly.  The muscle walls were inspected and small amount of fibrinous debris was debrided.  The muscle appeared viable, with muscle wall bleeding noted, though slightly dulled in appearance.  A fluted JP drain was placed in the subfascial space and tunneled out the skin and secured with a stitch.  The muscle and fascia was closed with 0 Vicryl stitches.  A medium Hemovac drain was placed in the subcutaneous seroma pocket and tunneled out the skin and secured with a stitch.  The subcutaneous wound layer appeared very healthy and pink so minimal debridement was needed.  This wound layer was closed with 2-0 Vicryl stitches.  The skin was closed and apposed with staples.  A Prevena incisional wound VAC dressing was then placed over the wound.  Patient was then turned supine and extubated by the anesthesia service.  All counts were correct at the end of surgery.  No complication was noted.   Findings: Subfascial organizing hematoma with fascial dehiscence at the superior aspect of the incision.  No infection or purulence was seen.  Healthy appearing wound edges and paraspinous musculature.  Estimated Blood Loss:  less than 50 mL         Drains: JACKSON-PRATT (JP), HEMOVAC, and Prevena incisional wound VAC         Total IV Fluids: See anesthesia records  Blood Given:  none          Specimens: Subfascial hematoma/seroma was cultured         Implants: none        Complications:  * No complications entered in OR log *         Disposition: PACU - hemodynamically stable.         Condition: stable

## 2021-01-17 NOTE — Anesthesia Preprocedure Evaluation (Addendum)
Anesthesia Evaluation  Patient identified by MRN, date of birth, ID band Patient awake    Reviewed: Allergy & Precautions, NPO status , Patient's Chart, lab work & pertinent test results  Airway Mallampati: II  TM Distance: >3 FB Neck ROM: Full    Dental  (+) Poor Dentition, Missing,    Pulmonary sleep apnea , Current Smoker and Patient abstained from smoking.,    Pulmonary exam normal        Cardiovascular hypertension, + dysrhythmias  Rhythm:Regular Rate:Normal     Neuro/Psych Seizures -,  PSYCHIATRIC DISORDERS Anxiety Depression Bipolar Disorder CVA    GI/Hepatic negative GI ROS, Neg liver ROS,   Endo/Other  negative endocrine ROS  Renal/GU negative Renal ROS     Musculoskeletal  (+) Arthritis ,   Abdominal Normal abdominal exam  (+)   Peds  Hematology negative hematology ROS (+)   Anesthesia Other Findings   Reproductive/Obstetrics                            Anesthesia Physical Anesthesia Plan  ASA: 2  Anesthesia Plan: General   Post-op Pain Management:    Induction: Intravenous  PONV Risk Score and Plan: 2 and Ondansetron, Dexamethasone and Midazolam  Airway Management Planned: Oral ETT  Additional Equipment: None  Intra-op Plan:   Post-operative Plan: Extubation in OR  Informed Consent: I have reviewed the patients History and Physical, chart, labs and discussed the procedure including the risks, benefits and alternatives for the proposed anesthesia with the patient or authorized representative who has indicated his/her understanding and acceptance.     Dental advisory given  Plan Discussed with: CRNA  Anesthesia Plan Comments: (Lab Results      Component                Value               Date                      WBC                      11.3 (H)            01/15/2021                HGB                      9.7 (L)             01/15/2021                HCT                       30.3 (L)            01/15/2021                MCV                      87.3                01/15/2021                PLT                      320  01/15/2021            Lab Results      Component                Value               Date                      CREATININE               1.21                01/15/2021                BUN                      18                  01/15/2021                NA                       135                 01/15/2021                K                        4.5                 01/15/2021                CL                       101                 01/15/2021                CO2                      28                  01/15/2021           )       Anesthesia Quick Evaluation

## 2021-01-17 NOTE — Anesthesia Procedure Notes (Addendum)
Procedure Name: Intubation Date/Time: 01/17/2021 9:42 AM Performed by: Lieutenant Diego, CRNA Pre-anesthesia Checklist: Patient identified, Emergency Drugs available, Suction available and Patient being monitored Patient Re-evaluated:Patient Re-evaluated prior to induction Oxygen Delivery Method: Circle system utilized Preoxygenation: Pre-oxygenation with 100% oxygen Induction Type: IV induction Ventilation: Mask ventilation without difficulty Laryngoscope Size: Miller and 2 Grade View: Grade I Tube type: Oral Tube size: 7.5 mm Number of attempts: 1 Airway Equipment and Method: Stylet and Oral airway Placement Confirmation: ETT inserted through vocal cords under direct vision, positive ETCO2 and breath sounds checked- equal and bilateral Secured at: 25 cm Tube secured with: Tape Dental Injury: Teeth and Oropharynx as per pre-operative assessment

## 2021-01-18 ENCOUNTER — Encounter (HOSPITAL_COMMUNITY): Payer: Self-pay | Admitting: Neurosurgery

## 2021-01-18 LAB — CBC
HCT: 27.7 % — ABNORMAL LOW (ref 39.0–52.0)
Hemoglobin: 8.9 g/dL — ABNORMAL LOW (ref 13.0–17.0)
MCH: 27.6 pg (ref 26.0–34.0)
MCHC: 32.1 g/dL (ref 30.0–36.0)
MCV: 86 fL (ref 80.0–100.0)
Platelets: 261 10*3/uL (ref 150–400)
RBC: 3.22 MIL/uL — ABNORMAL LOW (ref 4.22–5.81)
RDW: 16 % — ABNORMAL HIGH (ref 11.5–15.5)
WBC: 8.6 10*3/uL (ref 4.0–10.5)
nRBC: 0 % (ref 0.0–0.2)

## 2021-01-18 LAB — RENAL FUNCTION PANEL
Albumin: 2.7 g/dL — ABNORMAL LOW (ref 3.5–5.0)
Anion gap: 10 (ref 5–15)
BUN: 19 mg/dL (ref 6–20)
CO2: 23 mmol/L (ref 22–32)
Calcium: 9.8 mg/dL (ref 8.9–10.3)
Chloride: 103 mmol/L (ref 98–111)
Creatinine, Ser: 1.14 mg/dL (ref 0.61–1.24)
GFR, Estimated: >60 mL/min (ref >60)
Glucose, Bld: 114 mg/dL — ABNORMAL HIGH (ref 70–99)
Phosphorus: 4.1 mg/dL (ref 2.5–4.6)
Potassium: 4.5 mmol/L (ref 3.5–5.1)
Sodium: 136 mmol/L (ref 135–145)

## 2021-01-18 MED ORDER — DOCUSATE SODIUM 100 MG PO CAPS
100.0000 mg | ORAL_CAPSULE | Freq: Two times a day (BID) | ORAL | Status: DC | PRN
  Administered 2021-01-21 – 2021-01-31 (×6): 100 mg via ORAL
  Filled 2021-01-18 (×6): qty 1

## 2021-01-18 MED ORDER — GABAPENTIN 300 MG PO CAPS
300.0000 mg | ORAL_CAPSULE | Freq: Every day | ORAL | Status: DC
  Administered 2021-01-19 – 2021-01-31 (×13): 300 mg via ORAL
  Filled 2021-01-18 (×13): qty 1

## 2021-01-18 MED ORDER — LEVETIRACETAM 750 MG PO TABS
1500.0000 mg | ORAL_TABLET | Freq: Two times a day (BID) | ORAL | Status: DC
  Administered 2021-01-18 – 2021-01-31 (×26): 1500 mg via ORAL
  Filled 2021-01-18 (×26): qty 2

## 2021-01-18 MED ORDER — ACETAMINOPHEN 325 MG PO TABS
650.0000 mg | ORAL_TABLET | ORAL | Status: DC | PRN
  Administered 2021-01-18 – 2021-01-29 (×2): 650 mg via ORAL
  Filled 2021-01-18 (×3): qty 2

## 2021-01-18 MED ORDER — SENNA 8.6 MG PO TABS: 1.0000 | ORAL_TABLET | Freq: Every evening | ORAL | Status: DC | PRN

## 2021-01-18 NOTE — Progress Notes (Signed)
Physical Therapy Treatment Patient Details Name: Keith Dixon MRN: [id number] DOB: 07/06/1960 Today's Date: 01/18/2021    History of Present Illness Pt is a 60 y.o. male admitted 12/12/20 with back and BLE pain. S/p DLIF L1-L5 and T10-pelvis decompression and fusion 8/3; second stage of sx performed 8/4. Found to have RLE DVT, L IJ DVT; s/p IVC filter placement 8/6. Head CT 8/7 with bilateral cortical, L thalamic, bilateral cerebellar infarcts; suggestive of embolic disease. Brain MRI 8/13 with multiple acute/subacute infarcts in cerebellum,  thalami (L>R), bilateral cerebral hemispheres. Course complicated by AKI. ETT 8/4-8/15; reintubated 8/15. Trach placed 8/16. Underwent thoracolumbar wound exploration and application of wound vac on 9/8. PMH includes PTSD, HTN, OSA, bipolar, seizures, degenerative scoliosis, tobacco use.    PT Comments    Patient progressing towards physical therapy goals. Distance limited by fatigue and B LE instability. Patient required +2 for safety with use of RW. Patient more talkative this date but cognitive deficits remain. Continue to recommend SNF for ongoing Physical Therapy.       Follow Up Recommendations  SNF;Supervision for mobility/OOB     Equipment Recommendations  Wheelchair (measurements PT);Wheelchair cushion (measurements PT);Hospital bed;Rolling Keiasha Diep with 5" wheels;3in1 (PT)    Recommendations for Other Services       Precautions / Restrictions Precautions Precautions: Fall;Back Precaution Booklet Issued: No Spinal Brace: Thoracolumbosacral orthotic Spinal Brace Comments: clamshell Restrictions Weight Bearing Restrictions: No    Mobility  Bed Mobility Overal bed mobility: Needs Assistance Bed Mobility: Rolling;Sidelying to Sit Rolling: Supervision Sidelying to sit: Mod assist       General bed mobility comments: Assist to maintain no twisting precautions during rolling. assist with trunk elevation    Transfers Overall transfer  level: Needs assistance Equipment used: Rolling Adasha Boehme (2 wheeled) Transfers: Sit to/from Stand Sit to Stand: Mod assist;+2 physical assistance;+2 safety/equipment         General transfer comment: assist to powerup and steady. cues for technique. from EOB elevated seat height to recliner.  Ambulation/Gait Ambulation/Gait assistance: Min assist;+2 safety/equipment Gait Distance (Feet): 15 Feet Assistive device: Rolling Alexis Mizuno (2 wheeled) Gait Pattern/deviations: Step-through pattern;Decreased stride length;Trunk flexed;Antalgic Gait velocity: Decreased   General Gait Details: patient unsteady with incoordination noted with stepping. MinA for balance and RW management. Cues for close RW proximity. BLE instability and tremors noted initially   Stairs             Wheelchair Mobility    Modified Rankin (Stroke Patients Only) Modified Rankin (Stroke Patients Only) Pre-Morbid Rankin Score: Slight disability Modified Rankin: Moderately severe disability     Balance Overall balance assessment: Needs assistance Sitting-balance support: Feet supported;Bilateral upper extremity supported Sitting balance-Leahy Scale: Poor Sitting balance - Comments: needs BUE for static sitting   Standing balance support: Bilateral upper extremity supported;During functional activity Standing balance-Leahy Scale: Poor Standing balance comment: Reliant on BUE support                            Cognition Arousal/Alertness: Awake/alert Behavior During Therapy: WFL for tasks assessed/performed Overall Cognitive Status: No family/caregiver present to determine baseline cognitive functioning Area of Impairment: Attention;Memory;Problem solving;Awareness;Following commands;Safety/judgement                   Current Attention Level: Selective Memory: Decreased short-term memory;Decreased recall of precautions Following Commands: Follows one step commands consistently;Follows one  step commands with increased time;Follows multi-step commands inconsistently Safety/Judgement: Decreased awareness of safety Awareness: Emergent Problem  Solving: Slow processing;Difficulty sequencing;Requires verbal cues General Comments: Pt much more talkative today, smiling and making jokes.Cognitive deficits remain.      Exercises      General Comments General comments (skin integrity, edema, etc.): fatigues easily      Pertinent Vitals/Pain Pain Assessment: Faces Faces Pain Scale: Hurts even more Pain Location: Lower back Pain Descriptors / Indicators: Grimacing;Discomfort;Guarding Pain Intervention(s): Monitored during session    Home Living                      Prior Function            PT Goals (current goals can now be found in the care plan section) Acute Rehab PT Goals Patient Stated Goal: to move better PT Goal Formulation: With patient Time For Goal Achievement: 01/24/21 Potential to Achieve Goals: Fair Progress towards PT goals: Progressing toward goals    Frequency    Min 5X/week      PT Plan Current plan remains appropriate    Co-evaluation PT/OT/SLP Co-Evaluation/Treatment: Yes Reason for Co-Treatment: For patient/therapist safety;To address functional/ADL transfers;Complexity of the patient's impairments (multi-system involvement) PT goals addressed during session: Mobility/safety with mobility;Balance;Proper use of DME OT goals addressed during session: ADL's and self-care;Proper use of Adaptive equipment and DME      AM-PAC PT "6 Clicks" Mobility   Outcome Measure  Help needed turning from your back to your side while in a flat bed without using bedrails?: A Little Help needed moving from lying on your back to sitting on the side of a flat bed without using bedrails?: A Lot Help needed moving to and from a bed to a chair (including a wheelchair)?: A Lot Help needed standing up from a chair using your arms (e.g., wheelchair or  bedside chair)?: Total Help needed to walk in hospital room?: Total Help needed climbing 3-5 steps with a railing? : Total 6 Click Score: 10    End of Session Equipment Utilized During Treatment: Back brace Activity Tolerance: Patient tolerated treatment well;Patient limited by fatigue Patient left: in chair;with call bell/phone within reach;with chair alarm set Nurse Communication: Mobility status PT Visit Diagnosis: Muscle weakness (generalized) (M62.81);Difficulty in walking, not elsewhere classified (R26.2);Other symptoms and signs involving the nervous system (R29.898);Hemiplegia and hemiparesis;Pain;Unsteadiness on feet (R26.81) Hemiplegia - Right/Left: Right Hemiplegia - dominant/non-dominant: Dominant Hemiplegia - caused by: Cerebral infarction     [id number] PT Time Calculation (min) (ACUTE ONLY): 29 min  Charges:  $Gait Training: 8-22 mins                     Keith Dixon A. [name] PT, DPT Acute Rehabilitation Services Pager [phone] Office [phone]    Keith Dixon 01/18/2021, 4:56 PM

## 2021-01-18 NOTE — Progress Notes (Signed)
Occupational Therapy Treatment Patient Details Name: Keith Dixon MRN: [id number] DOB: 1960-09-26 Today's Date: 01/18/2021    History of present illness Pt is a 60 y.o. male admitted 12/12/20 with back and BLE pain. S/p DLIF L1-L5 and T10-pelvis decompression and fusion 8/3; second stage of sx performed 8/4. Found to have RLE DVT, L IJ DVT; s/p IVC filter placement 8/6. Head CT 8/7 with bilateral cortical, L thalamic, bilateral cerebellar infarcts; suggestive of embolic disease. Brain MRI 8/13 with multiple acute/subacute infarcts in cerebellum,  thalami (L>R), bilateral cerebral hemispheres. Course complicated by AKI. ETT 8/4-8/15; reintubated 8/15. Trach placed 8/16. PMH includes PTSD, HTN, OSA, bipolar, seizures, degenerative scoliosis, tobacco use.   OT comments  Pt progressing towards acute OT goals. Today, completed bathroom distance functional mobility with +2 assist. Up in recliner at end of session, discussed goal of sitting up for 1 hour. Pt in high spirits, talkative today. D/c plan remains appropriate.    Follow Up Recommendations  SNF    Equipment Recommendations  Other (comment) (defer to next venue)    Recommendations for Other Services      Precautions / Restrictions Precautions Precautions: Fall;Back Spinal Brace: Thoracolumbosacral orthotic Spinal Brace Comments: clamshell       Mobility Bed Mobility Overal bed mobility: Needs Assistance Bed Mobility: Rolling;Sidelying to Sit Rolling: Supervision Sidelying to sit: Mod assist       General bed mobility comments: Assist to maintain no twisting precautions during rolling. assist with trunk elevation    Transfers Overall transfer level: Needs assistance Equipment used: Rolling walker (2 wheeled) Transfers: Sit to/from Stand Sit to Stand: Mod assist;+2 physical assistance;+2 safety/equipment         General transfer comment: assist to powerup and steady. cues for technique. from EOB elevated seat height to  recliner.    Balance Overall balance assessment: Needs assistance Sitting-balance support: Feet supported;Bilateral upper extremity supported Sitting balance-Leahy Scale: Poor Sitting balance - Comments: needs BUE for static sitting   Standing balance support: Bilateral upper extremity supported;During functional activity Standing balance-Leahy Scale: Poor Standing balance comment: Reliant on BUE support                           ADL either performed or assessed with clinical judgement   ADL Overall ADL's : Needs assistance/impaired                         Toilet Transfer: Moderate assistance;+2 for safety/equipment;+2 for physical assistance;Ambulation;RW Toilet Transfer Details (indicate cue type and reason): simulated in room. BLE weakness and impaired gross motor coordination present         Functional mobility during ADLs: Moderate assistance;+2 for safety/equipment;+2 for physical assistance;Rolling walker General ADL Comments: mostly +2 for safety though appears very unsteady and suspect could need up to +2 physical assist     Vision       Perception     Praxis      Cognition Arousal/Alertness: Awake/alert Behavior During Therapy: [facility] for tasks assessed/performed Overall Cognitive Status: No family/caregiver present to determine baseline cognitive functioning Area of Impairment: Attention;Memory;Problem solving;Awareness;Following commands;Safety/judgement                   Current Attention Level: Selective Memory: Decreased short-term memory;Decreased recall of precautions Following Commands: Follows one step commands consistently;Follows one step commands with increased time;Follows multi-step commands inconsistently Safety/Judgement: Decreased awareness of safety Awareness: Emergent Problem Solving: Slow processing;Difficulty sequencing;Requires  verbal cues General Comments: Pt much more talkative today, smiling and making  jokes.Cognitive deficits remain.        Exercises     Shoulder Instructions       General Comments fatigues easily    Pertinent Vitals/ Pain       Pain Assessment: Faces Faces Pain Scale: Hurts even more Pain Location: Lower back Pain Descriptors / Indicators: Grimacing;Discomfort;Guarding Pain Intervention(s): Monitored during session;Repositioned;Limited activity within patient's tolerance  Home Living                                          Prior Functioning/Environment              Frequency  Min 2X/week        Progress Toward Goals  OT Goals(current goals can now be found in the care plan section)  Progress towards OT goals: Progressing toward goals  Acute Rehab OT Goals Patient Stated Goal: to move better OT Goal Formulation: With patient Time For Goal Achievement: 01/25/21 Potential to Achieve Goals: Good ADL Goals Pt Will Perform Grooming: with modified independence;sitting Additional ADL Goal #1: Pt will complete bed mobility min +2 (A) as precursor to adls. Additional ADL Goal #2: pt will tolerate eob sitting with mod (A) for static sitting balance Additional ADL Goal #3: pt will complete basic transfer total +2 min (A)  Plan Discharge plan remains appropriate;Frequency remains appropriate    Co-evaluation    PT/OT/SLP Co-Evaluation/Treatment: Yes Reason for Co-Treatment: To address functional/ADL transfers;For patient/therapist safety;Complexity of the patient's impairments (multi-system involvement)   OT goals addressed during session: ADL's and self-care;Proper use of [employer] equipment and DME      AM-PAC OT "6 Clicks" Daily Activity     Outcome Measure   Help from another person eating meals?: A Little Help from another person taking care of personal grooming?: A Little Help from another person toileting, which includes using toliet, bedpan, or urinal?: A Lot Help from another person bathing (including washing,  rinsing, drying)?: A Lot Help from another person to put on and taking off regular upper body clothing?: A Little Help from another person to put on and taking off regular lower body clothing?: A Lot 6 Click Score: 15    End of Session Equipment Utilized During Treatment: Gait belt;Rolling walker;Back brace  OT Visit Diagnosis: Unsteadiness on feet (R26.81);Muscle weakness (generalized) (M62.81);Pain   Activity Tolerance Patient tolerated treatment well   Patient Left in chair;with call bell/phone within reach;with chair alarm set   Nurse Communication Other (comment) (spoke with NT regarding how to get pt back to bed and goal of sitting up for 1 hour)        Time: [id number] OT Time Calculation (min): 29 min  Charges: OT General Charges $OT Visit: 1 Visit OT Treatments $Self Care/Home Management : 8-22 mins  Tyrone Schimke, [place] Pager: [phone] Office: [phone]    Hortencia Pilar 01/18/2021, 2:52 PM

## 2021-01-18 NOTE — Plan of Care (Signed)
  Problem: Education: Goal: Knowledge of General Education information will improve Description: Including pain rating scale, medication(s)/side effects and non-pharmacologic comfort measures Outcome: Progressing   Problem: Health Behavior/Discharge Planning: Goal: Ability to manage health-related needs will improve Outcome: Progressing   Problem: Clinical Measurements: Goal: Ability to maintain clinical measurements within normal limits will improve Outcome: Progressing Goal: Will remain free from infection Outcome: Progressing Goal: Diagnostic test results will improve Outcome: Progressing Goal: Respiratory complications will improve Outcome: Progressing Goal: Cardiovascular complication will be avoided Outcome: Progressing   Problem: Activity: Goal: Risk for activity intolerance will decrease Outcome: Progressing   Problem: Nutrition: Goal: Adequate nutrition will be maintained Outcome: Progressing   Problem: Coping: Goal: Level of anxiety will decrease Outcome: Progressing   Problem: Elimination: Goal: Will not experience complications related to bowel motility Outcome: Progressing Goal: Will not experience complications related to urinary retention Outcome: Progressing   Problem: Pain Managment: Goal: General experience of comfort will improve Outcome: Progressing   Problem: Safety: Goal: Ability to remain free from injury will improve Outcome: Progressing   Problem: Skin Integrity: Goal: Risk for impaired skin integrity will decrease Outcome: Progressing   Problem: Education: Goal: Knowledge of disease or condition will improve Outcome: Progressing Goal: Knowledge of secondary prevention will improve Outcome: Progressing Goal: Knowledge of patient specific risk factors addressed and post discharge goals established will improve Outcome: Progressing Goal: Individualized Educational Video(s) Outcome: Progressing   Problem: Coping: Goal: Will verbalize  positive feelings about self Outcome: Progressing Goal: Will identify appropriate support needs Outcome: Progressing   Problem: Health Behavior/Discharge Planning: Goal: Ability to manage health-related needs will improve Outcome: Progressing   Problem: Self-Care: Goal: Ability to participate in self-care as condition permits will improve Outcome: Progressing Goal: Verbalization of feelings and concerns over difficulty with self-care will improve Outcome: Progressing Goal: Ability to communicate needs accurately will improve Outcome: Progressing   Problem: Nutrition: Goal: Risk of aspiration will decrease Outcome: Progressing Goal: Dietary intake will improve Outcome: Progressing   Problem: Ischemic Stroke/TIA Tissue Perfusion: Goal: Complications of ischemic stroke/TIA will be minimized Outcome: Progressing   Problem: Activity: Goal: Ability to tolerate increased activity will improve Outcome: Progressing   Problem: Respiratory: Goal: Ability to maintain a clear airway and adequate ventilation will improve Outcome: Progressing   Problem: Role Relationship: Goal: Method of communication will improve Outcome: Progressing

## 2021-01-18 NOTE — Progress Notes (Signed)
Subjective: Patient reports some back soreness  Objective: Vital signs in last 24 hours: Temp:  [97.2 F (36.2 C)-99.3 F (37.4 C)] 98.1 F (36.7 C) (09/09 0825) Pulse Rate:  [82-108] 88 (09/09 0825) Resp:  [10-20] 18 (09/09 0825) BP: (142-193)/(78-99) 163/82 (09/09 0825) SpO2:  [93 %-100 %] 100 % (09/09 0825)  Intake/Output from previous day: 09/08 0701 - 09/09 0700 In: 2733.6 [I.V.:2733.6] Out: 1000 [Urine:800; Drains:150; Blood:50] Intake/Output this shift: No intake/output data recorded.  NAD 4+/5 in [place] in place Medium hemovac was disconnected so I reconnected it.  Lab Results: Recent Labs    01/18/21 0547  WBC 8.6  HGB 8.9*  HCT 27.7*  PLT 261   BMET Recent Labs    01/18/21 0547  NA 136  K 4.5  CL 103  CO2 23  GLUCOSE 114*  BUN 19  CREATININE 1.14  CALCIUM 9.8    Studies/Results: CT THORACIC SPINE WO CONTRAST  Result Date: 01/16/2021 CLINICAL DATA:  Status post thoracolumbar fusion. EXAM: CT THORACIC AND LUMBAR SPINE WITHOUT CONTRAST TECHNIQUE: Multidetector CT imaging of the thoracic and lumbar spine was performed without contrast. Multiplanar CT image reconstructions were also generated. COMPARISON:  MRI of the thoracic spine 09/13/2020, MRI of the lumbar spine 12/04/2020, thoracolumbar spine radiographs 11/16/2020 FINDINGS: CT THORACIC SPINE FINDINGS Status post posterior fusion T10-bi-iliac, with interbody disc spacers at L1-S1 and posterior decompression L2-L4. Alignment: S shaped curvature of the thoracic spine. No antero or retrolisthesis. Vertebrae: No acute fracture or focal pathologic process. Multilevel vertebral body height loss, which does not appear acute. Paraspinal and other soft tissues: Air and fluid in the soft tissues of the back, not unexpected post surgically. Right basilar atelectasis. Disc levels: Multilevel degenerative changes with disc height loss. No high-grade spinal canal stenosis or neural foraminal narrowing. CT LUMBAR  SPINE FINDINGS Segmentation: Lumbarization of S1. The last complete disc space is at S1-S2. Alignment: Levocurvature of the lumbar spine. No significant listhesis. Vertebrae: No acute fracture. Multilevel disc height loss with endplate degenerative changes, which are most severe at L2-L3 and L3-L4, where there is significant right lateral wedging. Paraspinal and other soft tissues: Fluid and air in the soft tissues of the back, with high-density material, likely postsurgical. Disc levels: Evaluation is limited by beam hardening artifact. Within this limitation, no high-grade spinal canal stenosis. There is moderate to severe neural foraminal narrowing on the right at L1-L2 and L2-L3 and on the left at T8-T9, primarily due to spinal curvature. IMPRESSION:: IMPRESSION: 1. Status post T10-bi-iliac posterior fusion, with interbody disc spacers at L1-S1, and posterior decompression L2-L4, without evidence of hardware failure. 2. Fluid and gas in the posterior paravertebral soft tissues, not unexpected postoperatively. Electronically Signed   By: Merilyn Baba M.D.   On: 01/16/2021 20:05   CT LUMBAR SPINE WO CONTRAST  Result Date: 01/16/2021 CLINICAL DATA:  Status post thoracolumbar fusion. EXAM: CT THORACIC AND LUMBAR SPINE WITHOUT CONTRAST TECHNIQUE: Multidetector CT imaging of the thoracic and lumbar spine was performed without contrast. Multiplanar CT image reconstructions were also generated. COMPARISON:  MRI of the thoracic spine 09/13/2020, MRI of the lumbar spine 12/04/2020, thoracolumbar spine radiographs 11/16/2020 FINDINGS: CT THORACIC SPINE FINDINGS Status post posterior fusion T10-bi-iliac, with interbody disc spacers at L1-S1 and posterior decompression L2-L4. Alignment: S shaped curvature of the thoracic spine. No antero or retrolisthesis. Vertebrae: No acute fracture or focal pathologic process. Multilevel vertebral body height loss, which does not appear acute. Paraspinal and other soft tissues: Air  and fluid  in the soft tissues of the back, not unexpected post surgically. Right basilar atelectasis. Disc levels: Multilevel degenerative changes with disc height loss. No high-grade spinal canal stenosis or neural foraminal narrowing. CT LUMBAR SPINE FINDINGS Segmentation: Lumbarization of S1. The last complete disc space is at S1-S2. Alignment: Levocurvature of the lumbar spine. No significant listhesis. Vertebrae: No acute fracture. Multilevel disc height loss with endplate degenerative changes, which are most severe at L2-L3 and L3-L4, where there is significant right lateral wedging. Paraspinal and other soft tissues: Fluid and air in the soft tissues of the back, with high-density material, likely postsurgical. Disc levels: Evaluation is limited by beam hardening artifact. Within this limitation, no high-grade spinal canal stenosis. There is moderate to severe neural foraminal narrowing on the right at L1-L2 and L2-L3 and on the left at T8-T9, primarily due to spinal curvature. IMPRESSION:: IMPRESSION: 1. Status post T10-bi-iliac posterior fusion, with interbody disc spacers at L1-S1, and posterior decompression L2-L4, without evidence of hardware failure. 2. Fluid and gas in the posterior paravertebral soft tissues, not unexpected postoperatively. Electronically Signed   By: Merilyn Baba M.D.   On: 01/16/2021 20:05    Assessment/Plan: S/p wound revision - will continue drains through weekend, possible SNF early next week   Vallarie Mare 01/18/2021, 8:52 AM

## 2021-01-19 NOTE — Plan of Care (Signed)
  Problem: Education: Goal: Knowledge of General Education information will improve Description: Including pain rating scale, medication(s)/side effects and non-pharmacologic comfort measures Outcome: Progressing   Problem: Health Behavior/Discharge Planning: Goal: Ability to manage health-related needs will improve Outcome: Progressing   Problem: Clinical Measurements: Goal: Ability to maintain clinical measurements within normal limits will improve Outcome: Progressing Goal: Will remain free from infection Outcome: Progressing Goal: Diagnostic test results will improve Outcome: Progressing Goal: Respiratory complications will improve Outcome: Progressing Goal: Cardiovascular complication will be avoided Outcome: Progressing   Problem: Activity: Goal: Risk for activity intolerance will decrease Outcome: Progressing   Problem: Nutrition: Goal: Adequate nutrition will be maintained Outcome: Progressing   Problem: Coping: Goal: Level of anxiety will decrease Outcome: Progressing   Problem: Elimination: Goal: Will not experience complications related to bowel motility Outcome: Progressing Goal: Will not experience complications related to urinary retention Outcome: Progressing   Problem: Pain Managment: Goal: General experience of comfort will improve Outcome: Progressing   Problem: Safety: Goal: Ability to remain free from injury will improve Outcome: Progressing   Problem: Skin Integrity: Goal: Risk for impaired skin integrity will decrease Outcome: Progressing   Problem: Education: Goal: Knowledge of disease or condition will improve Outcome: Progressing Goal: Knowledge of secondary prevention will improve Outcome: Progressing Goal: Knowledge of patient specific risk factors addressed and post discharge goals established will improve Outcome: Progressing Goal: Individualized Educational Video(s) Outcome: Progressing   Problem: Coping: Goal: Will verbalize  positive feelings about self Outcome: Progressing Goal: Will identify appropriate support needs Outcome: Progressing   Problem: Health Behavior/Discharge Planning: Goal: Ability to manage health-related needs will improve Outcome: Progressing   Problem: Self-Care: Goal: Ability to participate in self-care as condition permits will improve Outcome: Progressing Goal: Verbalization of feelings and concerns over difficulty with self-care will improve Outcome: Progressing Goal: Ability to communicate needs accurately will improve Outcome: Progressing   Problem: Nutrition: Goal: Risk of aspiration will decrease Outcome: Progressing Goal: Dietary intake will improve Outcome: Progressing   Problem: Ischemic Stroke/TIA Tissue Perfusion: Goal: Complications of ischemic stroke/TIA will be minimized Outcome: Progressing   Problem: Activity: Goal: Ability to tolerate increased activity will improve Outcome: Progressing   Problem: Respiratory: Goal: Ability to maintain a clear airway and adequate ventilation will improve Outcome: Progressing   Problem: Role Relationship: Goal: Method of communication will improve Outcome: Progressing

## 2021-01-19 NOTE — Progress Notes (Signed)
Physical Therapy Treatment Patient Details Name: Keith Dixon MRN: [id number] DOB: [birth year] Today's Date: 01/19/2021    History of Present Illness Pt is a 60 y.o. male admitted 12/12/20 with back and BLE pain. S/p DLIF L1-L5 and T10-pelvis decompression and fusion 8/3; second stage of sx performed 8/4. Found to have RLE DVT, L IJ DVT; s/p IVC filter placement 8/6. Head CT 8/7 with bilateral cortical, L thalamic, bilateral cerebellar infarcts; suggestive of embolic disease. Brain MRI 8/13 with multiple acute/subacute infarcts in cerebellum,  thalami (L>R), bilateral cerebral hemispheres. Course complicated by AKI. ETT 8/4-8/15; reintubated 8/15. Trach placed 8/16. Underwent thoracolumbar wound exploration and application of wound vac on 9/8. PMH includes PTSD, HTN, OSA, bipolar, seizures, degenerative scoliosis, tobacco use.    PT Comments    Pt doing well following commands and engaged in session. Needed min A to come to EOB initially but with posterior LOB initially with mod A to correct and come to upright sitting that he could maintain. Pt ambulated 25' with RW and min A +2 for safety with cues for upright posture. Worked on sit>stand from bed as well as recliner, pt has difficulty fwd wt shifting, esp with brace. PT will continue to follow.    Follow Up Recommendations  SNF;Supervision for mobility/OOB     Equipment Recommendations  Wheelchair (measurements PT);Wheelchair cushion (measurements PT);Hospital bed;Rolling walker with 5" wheels;3in1 (PT)    Recommendations for Other Services       Precautions / Restrictions Precautions Precautions: Fall;Back Precaution Booklet Issued: No Required Braces or Orthoses: Spinal Brace Spinal Brace: Thoracolumbosacral orthotic Spinal Brace Comments: clamshell Restrictions Weight Bearing Restrictions: No    Mobility  Bed Mobility Overal bed mobility: Needs Assistance Bed Mobility: Rolling;Sidelying to Sit Rolling:  Supervision Sidelying to sit: Min assist;Mod assist       General bed mobility comments: vc's to maintain back precautions. Min A for elevation of trunk into sitting and pt with immediate posterior LOB which he did not attempt to correct. Mod A to come fwd and get balance in sitting.    Transfers Overall transfer level: Needs assistance Equipment used: Rolling walker (2 wheeled) Transfers: Sit to/from Stand Sit to Stand: Mod assist;+2 physical assistance;+2 safety/equipment         General transfer comment: practiced from bed as well as recliner. assist for power up and fwd wt shift  Ambulation/Gait Ambulation/Gait assistance: Min assist;+2 safety/equipment Gait Distance (Feet): 25 Feet Assistive device: Rolling walker (2 wheeled) Gait Pattern/deviations: Step-through pattern;Decreased stride length;Trunk flexed;Antalgic Gait velocity: Decreased Gait velocity interpretation: <1.8 ft/sec, indicate of risk for recurrent falls General Gait Details: cues for posture throughout   Stairs             Wheelchair Mobility    Modified Rankin (Stroke Patients Only) Modified Rankin (Stroke Patients Only) Pre-Morbid Rankin Score: Slight disability Modified Rankin: Moderately severe disability     Balance Overall balance assessment: Needs assistance Sitting-balance support: Feet supported;Bilateral upper extremity supported Sitting balance-Leahy Scale: Poor Sitting balance - Comments: needs BUE for static sitting Postural control: Posterior lean Standing balance support: Bilateral upper extremity supported;During functional activity Standing balance-Leahy Scale: Poor Standing balance comment: Reliant on BUE support                            Cognition Arousal/Alertness: Awake/alert Behavior During Therapy: WFL for tasks assessed/performed Overall Cognitive Status: No family/caregiver present to determine baseline cognitive functioning Area of Impairment:  Memory;Problem solving;Awareness;Following  commands;Safety/judgement                     Memory: Decreased short-term memory;Decreased recall of precautions Following Commands: Follows one step commands consistently;Follows one step commands with increased time;Follows multi-step commands inconsistently Safety/Judgement: Decreased awareness of safety Awareness: Emergent Problem Solving: Slow processing;Difficulty sequencing;Requires verbal cues General Comments: pt followed all commands today, needs increased time for processing      Exercises Other Exercises Other Exercises: worked on maintaining standing balance with unilateral UE support x3 mins    General Comments General comments (skin integrity, edema, etc.): fatigues quickly. VSS      Pertinent Vitals/Pain Pain Assessment: Faces Faces Pain Scale: Hurts even more Pain Location: Lower back Pain Descriptors / Indicators: Grimacing;Discomfort;Guarding Pain Intervention(s): Limited activity within patient's tolerance;Monitored during session    Home Living                      Prior Function            PT Goals (current goals can now be found in the care plan section) Acute Rehab PT Goals Patient Stated Goal: to move better PT Goal Formulation: With patient Time For Goal Achievement: 01/24/21 Potential to Achieve Goals: Fair Progress towards PT goals: Progressing toward goals    Frequency    Min 5X/week      PT Plan Current plan remains appropriate    Co-evaluation              AM-PAC PT "6 Clicks" Mobility   Outcome Measure  Help needed turning from your back to your side while in a flat bed without using bedrails?: A Little Help needed moving from lying on your back to sitting on the side of a flat bed without using bedrails?: A Lot Help needed moving to and from a bed to a chair (including a wheelchair)?: A Lot Help needed standing up from a chair using your arms (e.g., wheelchair  or bedside chair)?: Total Help needed to walk in hospital room?: A Lot Help needed climbing 3-5 steps with a railing? : Total 6 Click Score: 11    End of Session Equipment Utilized During Treatment: Back brace Activity Tolerance: Patient tolerated treatment well;Patient limited by fatigue Patient left: in chair;with call bell/phone within reach;with chair alarm set Nurse Communication: Mobility status PT Visit Diagnosis: Muscle weakness (generalized) (M62.81);Difficulty in walking, not elsewhere classified (R26.2);Other symptoms and signs involving the nervous system (R29.898);Hemiplegia and hemiparesis;Pain;Unsteadiness on feet (R26.81) Hemiplegia - Right/Left: Right Hemiplegia - dominant/non-dominant: Dominant Hemiplegia - caused by: Cerebral infarction Pain - Right/Left:  (incisional) Pain - part of body:  (back)     Time: [id number] PT Time Calculation (min) (ACUTE ONLY): 24 min  Charges:  $Gait Training: 8-22 mins $Therapeutic Activity: 8-22 mins                     [place]  Pager [phone] Office [place] 01/19/2021, 2:11 PM

## 2021-01-19 NOTE — Progress Notes (Signed)
Neurosurgery Service Progress Note  Subjective: No acute events overnight, no new complaints   Objective: Vitals:   01/18/21 2333 01/19/21 0307 01/19/21 0735 01/19/21 1107  BP: 97/73 110/60 (!) 147/96 (!) 144/68  Pulse: 95 85 92 87  Resp: '18 18 18 18  '$ Temp: 98.3 F (36.8 C) 98.7 F (37.1 C) 98.1 F (36.7 C) 98.4 F (36.9 C)  TempSrc: Oral Axillary Oral   SpO2: 100% 100% 99% 100%  Weight:      Height:        Physical Exam: Wound vac in place w/ good seal, serosang drainage in JP bulb & vac container, strength 4+/5 in BLE  Assessment & Plan: 60 y.o. man s/p complex scoli deformity correction.   -cont wound vac / drain -on lovenox, ASA81 -SNF pending  Judith Part  01/19/21 11:08 AM

## 2021-01-20 NOTE — Progress Notes (Signed)
Neurosurgery Service Progress Note  Subjective: No acute events overnight, no new complaints, back stiff  Objective: Vitals:   01/19/21 1951 01/19/21 2324 01/20/21 0428 01/20/21 0732  BP: (!) 149/90 117/72 (!) 155/95 135/89  Pulse: 89 92 87 90  Resp: '18 18 18 16  '$ Temp: 98 F (36.7 C) 99 F (37.2 C) 99 F (37.2 C) 98.6 F (37 C)  TempSrc: Oral Oral Oral Oral  SpO2: 100% 100% 100% 100%  Weight:      Height:        Physical Exam: Wound vac in place w/ good seal, serosang drainage in JP bulb & vac container, strength 4+/5 in BLE  Assessment & Plan: 60 y.o. man s/p complex scoli deformity correction.   -cont wound vac / drain, put out 50 and 80 in the last shift -on lovenox, ASA81 -SNF pending  Judith Part  01/20/21 10:08 AM

## 2021-01-21 LAB — RENAL FUNCTION PANEL
Albumin: 2.9 g/dL — ABNORMAL LOW (ref 3.5–5.0)
Anion gap: 7 (ref 5–15)
BUN: 14 mg/dL (ref 6–20)
CO2: 27 mmol/L (ref 22–32)
Calcium: 9.9 mg/dL (ref 8.9–10.3)
Chloride: 101 mmol/L (ref 98–111)
Creatinine, Ser: 0.93 mg/dL (ref 0.61–1.24)
GFR, Estimated: >60 mL/min (ref >60)
Glucose, Bld: 118 mg/dL — ABNORMAL HIGH (ref 70–99)
Phosphorus: 3.9 mg/dL (ref 2.5–4.6)
Potassium: 4.2 mmol/L (ref 3.5–5.1)
Sodium: 135 mmol/L (ref 135–145)

## 2021-01-21 LAB — CBC
HCT: 29 % — ABNORMAL LOW (ref 39.0–52.0)
Hemoglobin: 9.2 g/dL — ABNORMAL LOW (ref 13.0–17.0)
MCH: 27.2 pg (ref 26.0–34.0)
MCHC: 31.7 g/dL (ref 30.0–36.0)
MCV: 85.8 fL (ref 80.0–100.0)
Platelets: 277 10*3/uL (ref 150–400)
RBC: 3.38 MIL/uL — ABNORMAL LOW (ref 4.22–5.81)
RDW: 15.8 % — ABNORMAL HIGH (ref 11.5–15.5)
WBC: 8.4 10*3/uL (ref 4.0–10.5)
nRBC: 0 % (ref 0.0–0.2)

## 2021-01-21 MED ORDER — SULFAMETHOXAZOLE-TRIMETHOPRIM 800-160 MG PO TABS
1.0000 | ORAL_TABLET | Freq: Two times a day (BID) | ORAL | Status: DC
  Administered 2021-01-21 – 2021-01-31 (×20): 1 via ORAL
  Filled 2021-01-21 (×21): qty 1

## 2021-01-21 MED ORDER — FLEET ENEMA 7-19 GM/118ML RE ENEM
1.0000 | ENEMA | Freq: Every day | RECTAL | Status: DC | PRN
  Administered 2021-01-29: 1 via RECTAL
  Filled 2021-01-21: qty 1

## 2021-01-21 NOTE — Progress Notes (Signed)
Physical Therapy Treatment Patient Details Name: Keith Dixon MRN: [id number] DOB: Feb 24, 1961 Today's Date: 01/21/2021   History of Present Illness Pt is a 60 y.o. male admitted 12/12/20 with back and BLE pain. S/p DLIF L1-L5 and T10-pelvis decompression and fusion 8/3; second stage of sx performed 8/4. Found to have RLE DVT, L IJ DVT; s/p IVC filter placement 8/6. Head CT 8/7 with bilateral cortical, L thalamic, bilateral cerebellar infarcts; suggestive of embolic disease. Brain MRI 8/13 with multiple acute/subacute infarcts in cerebellum,  thalami (L>R), bilateral cerebral hemispheres. Course complicated by AKI. ETT 8/4-8/15; reintubated 8/15. Trach placed 8/16. Underwent thoracolumbar wound exploration and application of wound vac on 9/8. PMH includes PTSD, HTN, OSA, bipolar, seizures, degenerative scoliosis, tobacco use.    PT Comments    Patient progressing with mobility in hallway ambulation.  Still limited by fatigued with LE's buckling some when needing to sit.  Patient with very ill fitting brace so contacted vendor for adjustment.  Patient appropriate for SNF level rehab.  Continues to also improve with cognition.  Will continue to follow acutely.   Recommendations for follow up therapy are one component of a multi-disciplinary discharge planning process, led by the attending physician.  Recommendations may be updated based on patient status, additional functional criteria and insurance authorization.  Follow Up Recommendations  SNF;Supervision for mobility/OOB     Equipment Recommendations  Wheelchair (measurements PT);Wheelchair cushion (measurements PT);Hospital bed;Rolling walker with 5" wheels;3in1 (PT)    Recommendations for Other Services       Precautions / Restrictions Precautions Precautions: Fall;Back Required Braces or Orthoses: Spinal Brace Spinal Brace: Thoracolumbosacral orthotic;Applied in standing position Spinal Brace Comments: clamshell     Mobility  Bed  Mobility Overal bed mobility: Needs Assistance Bed Mobility: Rolling;Sidelying to Sit Rolling: Min assist Sidelying to sit: Min assist       General bed mobility comments: cues for technique, assist for lifting trunk    Transfers   Equipment used: Rolling walker (2 wheeled) Transfers: Sit to/from Stand Sit to Stand: From elevated surface;Min assist;Mod assist;+2 physical assistance         General transfer comment: min A from EOB at higher level, mod A +2 for safety from lower surface of recliner  Ambulation/Gait Ambulation/Gait assistance: Min assist;+2 safety/equipment Gait Distance (Feet): 60 Feet (x 2) Assistive device: Rolling walker (2 wheeled) Gait Pattern/deviations: Step-through pattern;Trunk flexed     General Gait Details: flexed at hips, slow pace, but relatively steady until fatigued   Stairs             Wheelchair Mobility    Modified Rankin (Stroke Patients Only) Modified Rankin (Stroke Patients Only) Pre-Morbid Rankin Score: Slight disability Modified Rankin: Moderately severe disability     Balance Overall balance assessment: Needs assistance Sitting-balance support: Feet supported Sitting balance-Leahy Scale: Poor Sitting balance - Comments: leaning back on hands Postural control: Posterior lean Standing balance support: Bilateral upper extremity supported;During functional activity Standing balance-Leahy Scale: Poor Standing balance comment: Reliant on BUE support                            Cognition Arousal/Alertness: Awake/alert Behavior During Therapy: WFL for tasks assessed/performed Overall Cognitive Status: Within Functional Limits for tasks assessed                                 General Comments: mentating WFL this session, able  to laugh at the issues with his ill fitting brace, recalls they called about it one time before, reports how many days he has been here      Exercises      General  Comments        Pertinent Vitals/Pain Pain Assessment: 0-10 Pain Score: 8  Pain Location: Lower back Pain Descriptors / Indicators: Grimacing;Discomfort;Guarding Pain Intervention(s): Monitored during session;Repositioned;Limited activity within patient's tolerance    Home Living                      Prior Function            PT Goals (current goals can now be found in the care plan section) Progress towards PT goals: Progressing toward goals    Frequency    Min 5X/week      PT Plan Current plan remains appropriate    Co-evaluation              AM-PAC PT "6 Clicks" Mobility   Outcome Measure  Help needed turning from your back to your side while in a flat bed without using bedrails?: A Little Help needed moving from lying on your back to sitting on the side of a flat bed without using bedrails?: A Little Help needed moving to and from a bed to a chair (including a wheelchair)?: A Little Help needed standing up from a chair using your arms (e.g., wheelchair or bedside chair)?: A Lot Help needed to walk in hospital room?: A Little Help needed climbing 3-5 steps with a railing? : Total 6 Click Score: 15    End of Session Equipment Utilized During Treatment: Back brace Activity Tolerance: Patient limited by fatigue Patient left: in chair;with call bell/phone within reach;with chair alarm set   PT Visit Diagnosis: Muscle weakness (generalized) (M62.81);Difficulty in walking, not elsewhere classified (R26.2);Other symptoms and signs involving the nervous system (R29.898);Hemiplegia and hemiparesis;Pain;Unsteadiness on feet (R26.81) Hemiplegia - Right/Left: Right Hemiplegia - dominant/non-dominant: Dominant Hemiplegia - caused by: Cerebral infarction     Time: [id number] PT Time Calculation (min) (ACUTE ONLY): 29 min  Charges:  $Gait Training: 8-22 mins $Therapeutic Activity: 8-22 mins                     Magda Kiel, PT Acute Rehabilitation  Services Pager:[phone] [phone] 01/21/2021    Reginia Naas 01/21/2021, 4:06 PM

## 2021-01-21 NOTE — Progress Notes (Signed)
Intraoperative swab of subfascial space yielded rare pan-sensitive Klebsiella.  It is unlikely he had deep fascial infection given lack of clinical infection.  Nevertheless, I will switch him to Bactrim DS to be continued for 6 weeks.

## 2021-01-21 NOTE — Progress Notes (Signed)
Subjective: Patient reports constipation  Objective: Vital signs in last 24 hours: Temp:  [97.8 F (36.6 C)-98.9 F (37.2 C)] 98.4 F (36.9 C) (09/12 0758) Pulse Rate:  [89-100] 100 (09/12 0758) Resp:  [14-20] 14 (09/12 0758) BP: (121-168)/(6-91) 159/6 (09/12 0758) SpO2:  [96 %-99 %] 98 % (09/12 0758)  Intake/Output from previous day: 09/11 0701 - 09/12 0700 In: 957 [P.O.:957] Out: 402 [Urine:250; Drains:152] Intake/Output this shift: No intake/output data recorded.  NAD Prevena c/d/I, minimal output JP 62 ml, hemovac 90 ml 4+/5 strength in Les, deconditioning improving  Lab Results: No results for input(s): WBC, HGB, HCT, PLT in the last 72 hours. BMET No results for input(s): NA, K, CL, CO2, GLUCOSE, BUN, CREATININE, CALCIUM in the last 72 hours.  Studies/Results: No results found.  Assessment/Plan: S/p wound revision of T10-pelvis fusion - will continue drains until output < 25 ml daily - likely ready for SNF Wednesday   Keith Dixon 01/21/2021, 9:05 AM

## 2021-01-22 LAB — AEROBIC/ANAEROBIC CULTURE W GRAM STAIN (SURGICAL/DEEP WOUND)

## 2021-01-22 NOTE — Progress Notes (Signed)
Occupational Therapy Treatment Patient Details Name: Keith Dixon MRN: [id number] DOB: [birth year] Today's Date: 01/22/2021   History of present illness Pt is a 60 y.o. male admitted 12/12/20 with back and BLE pain. S/p DLIF L1-L5 and T10-pelvis decompression and fusion 8/3; second stage of sx performed 8/4. Found to have RLE DVT, L IJ DVT; s/p IVC filter placement 8/6. Head CT 8/7 with bilateral cortical, L thalamic, bilateral cerebellar infarcts; suggestive of embolic disease. Brain MRI 8/13 with multiple acute/subacute infarcts in cerebellum,  thalami (L>R), bilateral cerebral hemispheres. Course complicated by AKI. ETT 8/4-8/15; reintubated 8/15. Trach placed 8/16. Underwent thoracolumbar wound exploration and application of wound vac on 9/8. PMH includes PTSD, HTN, OSA, bipolar, seizures, degenerative scoliosis, tobacco use.   OT comments  Pt demonstrates max +2 (A) from chair this session and to don brace due to poor fit. Pt sitting in chair setup to complete self feeding. Recommendations for snf at this time.     Recommendations for follow up therapy are one component of a multi-disciplinary discharge planning process, led by the attending physician.  Recommendations may be updated based on patient status, additional functional criteria and insurance authorization.    Follow Up Recommendations  SNF    Equipment Recommendations  3 in 1 bedside commode;Other (comment) (RW)    Recommendations for Other Services      Precautions / Restrictions Precautions Precautions: Fall;Back Precaution Comments: spinal precautions, JP drain, wound vac, hemavac Required Braces or Orthoses: Spinal Brace Spinal Brace: Thoracolumbosacral orthotic;Applied in sitting position Spinal Brace Comments: clamshell Other Brace: requires increased tightening in standing due to poor fit of brace Restrictions Weight Bearing Restrictions: No       Mobility Bed Mobility Overal bed mobility: Needs  Assistance Bed Mobility: Rolling;Sidelying to Sit Rolling: Min assist Sidelying to sit: Mod assist;+2 for physical assistance       General bed mobility comments: mod +2 for log roll techique to EOB towards L, most difficulty with trunk elevation and LE lowering over EOB. pt instructed to sit up and rolling onto back instead of pushing up. pt with decreased sequence    Transfers Overall transfer level: Needs assistance Equipment used: Rolling walker (2 wheeled) Transfers: Sit to/from Stand Sit to Stand: Min assist;+2 physical assistance;Max assist         General transfer comment: min +2 for stand from elevated bed height for initial power up and steadying, max assist for rise from low recliner, righting posture.    Balance Overall balance assessment: Needs assistance Sitting-balance support: Bilateral upper extremity supported;Feet supported Sitting balance-Leahy Scale: Fair Sitting balance - Comments: bil UE support on bed with strong grasp Postural control: Posterior lean Standing balance support: Bilateral upper extremity supported;During functional activity Standing balance-Leahy Scale: Poor Standing balance comment: Reliant on BUE support                           ADL either performed or assessed with clinical judgement   ADL Overall ADL's : Needs assistance/impaired Eating/Feeding: Set up;Sitting Eating/Feeding Details (indicate cue type and reason): eating breakfast with late arrival of tray. pt benefits from straws to decrease spillage due to fit of brace Grooming: Wash/dry hands;Wash/dry face;Set up;Sitting               Lower Body Dressing: Total assistance   Toilet Transfer: +2 for physical assistance;Maximal assistance;RW Toilet Transfer Details (indicate cue type and reason): simulated from chair  Functional mobility during ADLs: +2 for physical assistance;Moderate assistance       Vision       Perception     Praxis       Cognition Arousal/Alertness: Awake/alert Behavior During Therapy: WFL for tasks assessed/performed Overall Cognitive Status: Within Functional Limits for tasks assessed                                 General Comments: min increased processing time/ difficulty with bed sequence on the opposite side        Exercises General Exercises - Lower Extremity Long Arc Quad: AROM;Both;10 reps;Seated   Shoulder Instructions       General Comments fatigues but pushing himself to participate. pt with visually decreased weight from prior sessions and pt reports not eating outside food anymore only hospital food.    Pertinent Vitals/ Pain       Pain Assessment: Faces Faces Pain Scale: Hurts even more Pain Location: Lower back Pain Descriptors / Indicators: Grimacing;Discomfort;Guarding Pain Intervention(s): Limited activity within patient's tolerance;Monitored during session;Repositioned  Home Living                                          Prior Functioning/Environment              Frequency  Min 2X/week        Progress Toward Goals  OT Goals(current goals can now be found in the care plan section)  Progress towards OT goals: Progressing toward goals  Acute Rehab OT Goals Patient Stated Goal: to move better OT Goal Formulation: With patient Time For Goal Achievement: 01/25/21 Potential to Achieve Goals: Good ADL Goals Pt Will Perform Grooming: with modified independence;sitting Additional ADL Goal #1: Pt will complete bed mobility min +2 (A) as precursor to adls. Additional ADL Goal #2: pt will tolerate eob sitting with mod (A) for static sitting balance Additional ADL Goal #3: pt will complete basic transfer total +2 min (A)  Plan Discharge plan remains appropriate    Co-evaluation    PT/OT/SLP Co-Evaluation/Treatment: Yes Reason for Co-Treatment: For patient/therapist safety;To address functional/ADL transfers PT goals addressed  during session: Mobility/safety with mobility;Balance;Proper use of DME;Strengthening/ROM OT goals addressed during session: ADL's and self-care;Proper use of Adaptive equipment and DME;Strengthening/ROM      AM-PAC OT "6 Clicks" Daily Activity     Outcome Measure   Help from another person eating meals?: A Little Help from another person taking care of personal grooming?: A Little Help from another person toileting, which includes using toliet, bedpan, or urinal?: A Little Help from another person bathing (including washing, rinsing, drying)?: A Lot Help from another person to put on and taking off regular upper body clothing?: A Little Help from another person to put on and taking off regular lower body clothing?: A Lot 6 Click Score: 16    End of Session Equipment Utilized During Treatment: Gait belt;Rolling walker;Back brace  OT Visit Diagnosis: Unsteadiness on feet (R26.81);Muscle weakness (generalized) (M62.81);Pain   Activity Tolerance Patient tolerated treatment well   Patient Left in chair;with call bell/phone within reach;with chair alarm set   Nurse Communication Mobility status;Precautions        Time: [id number] OT Time Calculation (min): 27 min  Charges: OT General Charges $OT Visit: 1 Visit OT Treatments $Self Care/Home Management : 8-22  mins   Fleeta Emmer, OTR/L  Acute Rehabilitation Services Pager: [phone] Office: [phone] .   Jeri Modena 01/22/2021, 9:58 AM

## 2021-01-22 NOTE — Progress Notes (Signed)
Subjective: Patient reports back pain improved from yesterday  Objective: Vital signs in last 24 hours: Temp:  [97.6 F (36.4 C)-98.6 F (37 C)] 97.6 F (36.4 C) (09/13 1055) Pulse Rate:  [76-96] 76 (09/13 1055) Resp:  [13-16] 15 (09/13 1055) BP: (139-161)/(68-102) 145/88 (09/13 1055) SpO2:  [97 %-100 %] 98 % (09/13 1055)  Intake/Output from previous day: 09/12 0701 - 09/13 0700 In: 600 [P.O.:600] Out: 1510 [Urine:1400; Drains:110] Intake/Output this shift: Total I/O In: 400 [P.O.:400] Out: -   Dressing c/d Mostly serous drainage from superficial drain, serosanguinous from deep drain No output from Prevena 4+/5 strength in LEs  Lab Results: Recent Labs    01/21/21 0951  WBC 8.4  HGB 9.2*  HCT 29.0*  PLT 277   BMET Recent Labs    01/21/21 0951  NA 135  K 4.2  CL 101  CO2 27  GLUCOSE 118*  BUN 14  CREATININE 0.93  CALCIUM 9.9    Studies/Results: No results found.  Assessment/Plan: S/p T10-pelvis reconstruction, recent wound revision - possibly will remove drains tomorrow depending on output - likely ready for SNF tomorrow or Thursday   Vallarie Mare 01/22/2021, 12:44 PM

## 2021-01-22 NOTE — Progress Notes (Signed)
Physical Therapy Treatment Patient Details Name: Keith Dixon MRN: [id number] DOB: 02/09/1961 Today's Date: 01/22/2021   History of Present Illness Pt is a 60 y.o. male admitted 12/12/20 with back and BLE pain. S/p DLIF L1-L5 and T10-pelvis decompression and fusion 8/3; second stage of sx performed 8/4. Found to have RLE DVT, L IJ DVT; s/p IVC filter placement 8/6. Head CT 8/7 with bilateral cortical, L thalamic, bilateral cerebellar infarcts; suggestive of embolic disease. Brain MRI 8/13 with multiple acute/subacute infarcts in cerebellum,  thalami (L>R), bilateral cerebral hemispheres. Course complicated by AKI. ETT 8/4-8/15; reintubated 8/15. Trach placed 8/16. Underwent thoracolumbar wound exploration and application of wound vac on 9/8. PMH includes PTSD, HTN, OSA, bipolar, seizures, degenerative scoliosis, tobacco use.    PT Comments    Pt reports he is having moderate back pain, but agreeable to OOB mobility. Pt ambulatory 2x50 ft today with RW with min +2 assist, pt demonstrating most difficulty with power up from lower surfaces requiring more mod-max +2. Pt reports min dizziness and exhibited diaphoresis post-gait training, pt reports this is due to not eating yet, pt given breakfast tray up in chair at end of session. Will continue to follow.     Recommendations for follow up therapy are one component of a multi-disciplinary discharge planning process, led by the attending physician.  Recommendations may be updated based on patient status, additional functional criteria and insurance authorization.  Follow Up Recommendations  SNF;Supervision for mobility/OOB     Equipment Recommendations  Wheelchair (measurements PT);Wheelchair cushion (measurements PT);Hospital bed;Rolling walker with 5" wheels;3in1 (PT)    Recommendations for Other Services       Precautions / Restrictions Precautions Precautions: Fall;Back Required Braces or Orthoses: Spinal Brace Spinal Brace:  Thoracolumbosacral orthotic;Applied in sitting position Spinal Brace Comments: clamshell Restrictions Weight Bearing Restrictions: No     Mobility  Bed Mobility Overal bed mobility: Needs Assistance Bed Mobility: Rolling;Sidelying to Sit Rolling: Min assist Sidelying to sit: Mod assist;+2 for physical assistance       General bed mobility comments: mod +2 for log roll techique to EOB towards L, most difficulty with trunk elevation and LE lowering over EOB.    Transfers Overall transfer level: Needs assistance Equipment used: Rolling walker (2 wheeled) Transfers: Sit to/from Stand Sit to Stand: Min assist;+2 physical assistance;Max assist         General transfer comment: min +2 for stand from elevated bed height for initial power up and steadying, max assist for rise from low recliner, righting posture.  Ambulation/Gait   Gait Distance (Feet): 50 Feet (x2 - 2 minutes seated rest break to recover fatigue) Assistive device: Rolling walker (2 wheeled) Gait Pattern/deviations: Step-through pattern;Trunk flexed;Narrow base of support Gait velocity: decr   General Gait Details: min assist to steady, physically correct forward flexed posture. Verbal cuing for posture, widening BOS.   Stairs             Wheelchair Mobility    Modified Rankin (Stroke Patients Only) Modified Rankin (Stroke Patients Only) Pre-Morbid Rankin Score: Slight disability Modified Rankin: Moderately severe disability     Balance Overall balance assessment: Needs assistance Sitting-balance support: Feet supported Sitting balance-Leahy Scale: Fair Sitting balance - Comments: can sit statically without support, requires UE support dynamically Postural control: Posterior lean Standing balance support: Bilateral upper extremity supported;During functional activity Standing balance-Leahy Scale: Poor Standing balance comment: Reliant on BUE support  Cognition Arousal/Alertness: Awake/alert Behavior During Therapy: WFL for tasks assessed/performed Overall Cognitive Status: Within Functional Limits for tasks assessed                                 General Comments: min increased processing time      Exercises General Exercises - Lower Extremity Long Arc Quad: AROM;Both;10 reps;Seated    General Comments        Pertinent Vitals/Pain Pain Assessment: Faces Faces Pain Scale: Hurts even more Pain Location: Lower back Pain Descriptors / Indicators: Grimacing;Discomfort;Guarding Pain Intervention(s): Limited activity within patient's tolerance;Monitored during session;Repositioned    Home Living                      Prior Function            PT Goals (current goals can now be found in the care plan section) Acute Rehab PT Goals Patient Stated Goal: to move better PT Goal Formulation: With patient Time For Goal Achievement: 01/24/21 Potential to Achieve Goals: Fair Progress towards PT goals: Progressing toward goals    Frequency    Min 5X/week      PT Plan Current plan remains appropriate    Co-evaluation PT/OT/SLP Co-Evaluation/Treatment: Yes Reason for Co-Treatment: For patient/therapist safety;To address functional/ADL transfers PT goals addressed during session: Mobility/safety with mobility;Balance;Proper use of DME;Strengthening/ROM        AM-PAC PT "6 Clicks" Mobility   Outcome Measure  Help needed turning from your back to your side while in a flat bed without using bedrails?: A Little Help needed moving from lying on your back to sitting on the side of a flat bed without using bedrails?: A Little Help needed moving to and from a bed to a chair (including a wheelchair)?: A Lot Help needed standing up from a chair using your arms (e.g., wheelchair or bedside chair)?: A Lot Help needed to walk in hospital room?: A Little Help needed climbing 3-5 steps with a railing? :  Total 6 Click Score: 14    End of Session Equipment Utilized During Treatment: Back brace Activity Tolerance: Patient limited by fatigue Patient left: in chair;with call bell/phone within reach;with chair alarm set Nurse Communication: Mobility status PT Visit Diagnosis: Muscle weakness (generalized) (M62.81);Difficulty in walking, not elsewhere classified (R26.2);Other symptoms and signs involving the nervous system (R29.898);Hemiplegia and hemiparesis;Pain;Unsteadiness on feet (R26.81) Hemiplegia - Right/Left: Right Hemiplegia - dominant/non-dominant: Dominant Hemiplegia - caused by: Cerebral infarction     Time: [id number] PT Time Calculation (min) (ACUTE ONLY): 26 min  Charges:  $Gait Training: 8-22 mins                     Stacie Glaze, PT DPT Acute Rehabilitation Services Pager [phone]  Office [phone]    Roxine Caddy E Ruffin Pyo 01/22/2021, 9:47 AM

## 2021-01-23 MED ORDER — PHENYLEPHRINE HCL-NACL 20-0.9 MG/250ML-% IV SOLN
INTRAVENOUS | Status: AC
  Filled 2021-01-23: qty 1000

## 2021-01-23 MED ORDER — LORAZEPAM 2 MG/ML IJ SOLN: 1.0000 mg | Freq: Four times a day (QID) | INTRAMUSCULAR | Status: DC | PRN

## 2021-01-23 MED ORDER — ENSURE ENLIVE PO LIQD
237.0000 mL | Freq: Three times a day (TID) | ORAL | Status: DC
  Administered 2021-01-23 – 2021-01-31 (×18): 237 mL via ORAL

## 2021-01-23 MED ORDER — PROPOFOL 1000 MG/100ML IV EMUL
INTRAVENOUS | Status: AC
  Filled 2021-01-23: qty 200

## 2021-01-23 MED ORDER — LORAZEPAM 2 MG/ML IJ SOLN
1.0000 mg | INTRAMUSCULAR | Status: AC
  Administered 2021-01-23: 1 mg via INTRAVENOUS
  Filled 2021-01-23: qty 1

## 2021-01-23 NOTE — Progress Notes (Signed)
  Speech Language Pathology    Patient Details Name: Keith Dixon MRN: [id number] DOB: 1960/08/04 Today's Date: 01/23/2021 Time:  -     ST has followed  pt from admission. Briefly discussed plan with pt. Today. Memory deficits prior due to seizures per pt and significant other handles appointments etc. Discharge ST   Houston Siren 01/23/2021, 3:16 PM

## 2021-01-23 NOTE — Progress Notes (Signed)
Received a call from patient's friend stating that the patient is not acting right and that he is acting somewhat like he does before a seizure.  Assessed patient and no changes except that patient is not able to tell me why he is in the hospital and acting a little off.  Patient states, "I don't know what is wrong, I feel okay and then I don't and then I do and then I don't."  Patient states it does feel like he sometimes feels prior to a seizure.  Informed Dr. Marcello Moores and orders for IV ativan received to give now and as needed.    No IV site at this time, unable to place one so order placed for IV team consult STAT.  Trilby Drummer RN taking over care and notified of these changes.  Informed Trilby Drummer of patient's blood pressure as well and that he needs Labetalol IV and the ativan IV when the IV is placed.    01/23/21 1840  Vitals  Temp 98.4 F (36.9 C)  Temp Source Oral  BP (!) 176/81  MAP (mmHg) 102  BP Location Right Arm  BP Method Automatic  Patient Position (if appropriate) Lying  Pulse Rate 90  Resp 20  Level of Consciousness  Level of Consciousness Alert  MEWS COLOR  MEWS Score Color Green  Oxygen Therapy  SpO2 97 %  O2 Device Room Air

## 2021-01-23 NOTE — Progress Notes (Signed)
Physical Therapy Treatment Patient Details Name: Keith Dixon MRN: [id number] DOB: July 13, 1960 Today's Date: 01/23/2021   History of Present Illness Pt is a 60 y.o. male admitted 12/12/20 with back and BLE pain. S/p DLIF L1-L5 and T10-pelvis decompression and fusion 8/3; second stage of sx performed 8/4. Found to have RLE DVT, L IJ DVT; s/p IVC filter placement 8/6. Head CT 8/7 with bilateral cortical, L thalamic, bilateral cerebellar infarcts; suggestive of embolic disease. Brain MRI 8/13 with multiple acute/subacute infarcts in cerebellum,  thalami (L>R), bilateral cerebral hemispheres. Course complicated by AKI. ETT 8/4-8/15; reintubated 8/15. Trach placed 8/16. Underwent thoracolumbar wound exploration and application of wound vac on 9/8. PMH includes PTSD, HTN, OSA, bipolar, seizures, degenerative scoliosis, tobacco use.    PT Comments    Pt in good spirits, motivated to get OOB. Pt ambulatory with RW with light assist for steadying and postural adjustments, no chair follow required today. Pt continues to demonstrate LE weakness, instructed in LE exercises to address. PT to continue to follow.      Recommendations for follow up therapy are one component of a multi-disciplinary discharge planning process, led by the attending physician.  Recommendations may be updated based on patient status, additional functional criteria and insurance authorization.  Follow Up Recommendations  SNF;Supervision for mobility/OOB     Equipment Recommendations  Wheelchair (measurements PT);Wheelchair cushion (measurements PT);Rolling walker with 5" wheels;3in1 (PT);Hospital bed    Recommendations for Other Services       Precautions / Restrictions Precautions Precautions: Fall;Back Precaution Comments: spinal precautions, JP drain, wound vac, hemavac Required Braces or Orthoses: Spinal Brace Spinal Brace: Thoracolumbosacral orthotic;Applied in sitting position Spinal Brace Comments: clamshell Other  Brace: requires increased tightening in standing due to poor fit of brace     Mobility  Bed Mobility Overal bed mobility: Needs Assistance Bed Mobility: Rolling;Sidelying to Sit Rolling: Min assist Sidelying to sit: Mod assist       General bed mobility comments: min-mod assist for trunk and LE management, utilized log roll technique    Transfers Overall transfer level: Needs assistance Equipment used: Rolling walker (2 wheeled) Transfers: Sit to/from Stand Sit to Stand: Mod assist;From elevated surface         General transfer comment: mod assist for power up, rise, steadying. Verbal cuing for hand placement.  Ambulation/Gait Ambulation/Gait assistance: Min assist Gait Distance (Feet): 60 Feet Assistive device: Rolling walker (2 wheeled) Gait Pattern/deviations: Step-through pattern;Trunk flexed;Narrow base of support;Decreased stride length Gait velocity: decr   General Gait Details: light assist to steady, cues for placement in RW, upright posture.   Stairs             Wheelchair Mobility    Modified Rankin (Stroke Patients Only) Modified Rankin (Stroke Patients Only) Pre-Morbid Rankin Score: Slight disability Modified Rankin: Moderately severe disability     Balance Overall balance assessment: Needs assistance Sitting-balance support: Bilateral upper extremity supported;Feet supported Sitting balance-Leahy Scale: Fair Sitting balance - Comments: requires heavy use on UEs Postural control: Posterior lean Standing balance support: Bilateral upper extremity supported;During functional activity Standing balance-Leahy Scale: Poor Standing balance comment: Reliant on BUE support                            Cognition Arousal/Alertness: Awake/alert Behavior During Therapy: WFL for tasks assessed/performed Overall Cognitive Status: Within Functional Limits for tasks assessed  General Comments: min  increased processing time      Exercises General Exercises - Lower Extremity Long Arc Quad: AROM;Both;15 reps;Seated    General Comments        Pertinent Vitals/Pain Pain Assessment: Faces Faces Pain Scale: Hurts even more Pain Location: Lower back Pain Descriptors / Indicators: Grimacing;Discomfort;Guarding Pain Intervention(s): Limited activity within patient's tolerance;Monitored during session;Repositioned    Home Living                      Prior Function            PT Goals (current goals can now be found in the care plan section) Acute Rehab PT Goals Patient Stated Goal: to move better PT Goal Formulation: With patient Time For Goal Achievement: 01/24/21 Potential to Achieve Goals: Fair Progress towards PT goals: Progressing toward goals    Frequency    Min 5X/week      PT Plan Current plan remains appropriate    Co-evaluation              AM-PAC PT "6 Clicks" Mobility   Outcome Measure  Help needed turning from your back to your side while in a flat bed without using bedrails?: A Little Help needed moving from lying on your back to sitting on the side of a flat bed without using bedrails?: A Little Help needed moving to and from a bed to a chair (including a wheelchair)?: A Lot Help needed standing up from a chair using your arms (e.g., wheelchair or bedside chair)?: A Lot Help needed to walk in hospital room?: A Little Help needed climbing 3-5 steps with a railing? : Total 6 Click Score: 14    End of Session Equipment Utilized During Treatment: Back brace Activity Tolerance: Patient limited by fatigue Patient left: in chair;with call bell/phone within reach;with chair alarm set Nurse Communication: Mobility status PT Visit Diagnosis: Muscle weakness (generalized) (M62.81);Difficulty in walking, not elsewhere classified (R26.2);Other symptoms and signs involving the nervous system (R29.898);Hemiplegia and hemiparesis;Pain;Unsteadiness  on feet (R26.81) Hemiplegia - Right/Left: Right Hemiplegia - dominant/non-dominant: Dominant Hemiplegia - caused by: Cerebral infarction     Time: [id number] PT Time Calculation (min) (ACUTE ONLY): 24 min  Charges:  $Gait Training: 8-22 mins $Therapeutic Activity: 8-22 mins                    Stacie Glaze, PT DPT Acute Rehabilitation Services Pager [phone]  Office [phone]    Roxine Caddy E Ruffin Pyo 01/23/2021, 4:24 PM

## 2021-01-23 NOTE — Progress Notes (Signed)
Subjective: Patient reports pain well-controlled  Objective: Vital signs in last 24 hours: Temp:  [97.6 F (36.4 C)-98.9 F (37.2 C)] 98.4 F (36.9 C) (09/14 0700) Pulse Rate:  [72-99] 72 (09/14 0700) Resp:  [14-16] 16 (09/14 0700) BP: (119-157)/(70-91) 119/70 (09/14 0700) SpO2:  [96 %-100 %] 96 % (09/14 0700)  Intake/Output from previous day: 09/13 0701 - 09/14 0700 In: 900 [P.O.:900] Out: 2265 [Urine:2150; Drains:115] Intake/Output this shift: No intake/output data recorded.  NAD Prevena dressing intact Serous drainage in hemovac, serosanguinous in subfascial JP Output 70 ml/40 ml  Lab Results: Recent Labs    01/21/21 0951  WBC 8.4  HGB 9.2*  HCT 29.0*  PLT 277   BMET Recent Labs    01/21/21 0951  NA 135  K 4.2  CL 101  CO2 27  GLUCOSE 118*  BUN 14  CREATININE 0.93  CALCIUM 9.9    Studies/Results: No results found.  Assessment/Plan: T10-pelvis reconstruction, recent wound revision - drain output still significant, so will keep in place - Prevena to be removed tomorrow - if drains can be removed, patient can go to SNF; alternatively, could go to rehab with drains if family feels comfortable with rehab plan.   Vallarie Mare 01/23/2021, 10:06 AM

## 2021-01-23 NOTE — Plan of Care (Signed)
  Problem: Education: Goal: Knowledge of General Education information will improve Description: Including pain rating scale, medication(s)/side effects and non-pharmacologic comfort measures Outcome: Progressing   Problem: Health Behavior/Discharge Planning: Goal: Ability to manage health-related needs will improve Outcome: Progressing   Problem: Clinical Measurements: Goal: Ability to maintain clinical measurements within normal limits will improve Outcome: Progressing Goal: Will remain free from infection Outcome: Progressing Goal: Diagnostic test results will improve Outcome: Progressing Goal: Respiratory complications will improve Outcome: Progressing Goal: Cardiovascular complication will be avoided Outcome: Progressing   Problem: Activity: Goal: Risk for activity intolerance will decrease Outcome: Progressing   Problem: Nutrition: Goal: Adequate nutrition will be maintained Outcome: Progressing   Problem: Coping: Goal: Level of anxiety will decrease Outcome: Progressing   Problem: Elimination: Goal: Will not experience complications related to bowel motility Outcome: Progressing Goal: Will not experience complications related to urinary retention Outcome: Progressing   Problem: Pain Managment: Goal: General experience of comfort will improve Outcome: Progressing   Problem: Safety: Goal: Ability to remain free from injury will improve Outcome: Progressing   Problem: Skin Integrity: Goal: Risk for impaired skin integrity will decrease Outcome: Progressing   Problem: Education: Goal: Knowledge of disease or condition will improve Outcome: Progressing Goal: Knowledge of secondary prevention will improve Outcome: Progressing Goal: Knowledge of patient specific risk factors addressed and post discharge goals established will improve Outcome: Progressing Goal: Individualized Educational Video(s) Outcome: Progressing   Problem: Coping: Goal: Will verbalize  positive feelings about self Outcome: Progressing Goal: Will identify appropriate support needs Outcome: Progressing   Problem: Health Behavior/Discharge Planning: Goal: Ability to manage health-related needs will improve Outcome: Progressing   Problem: Self-Care: Goal: Ability to participate in self-care as condition permits will improve Outcome: Progressing Goal: Verbalization of feelings and concerns over difficulty with self-care will improve Outcome: Progressing Goal: Ability to communicate needs accurately will improve Outcome: Progressing   Problem: Nutrition: Goal: Risk of aspiration will decrease Outcome: Progressing Goal: Dietary intake will improve Outcome: Progressing   Problem: Ischemic Stroke/TIA Tissue Perfusion: Goal: Complications of ischemic stroke/TIA will be minimized Outcome: Progressing   Problem: Activity: Goal: Ability to tolerate increased activity will improve Outcome: Progressing   Problem: Respiratory: Goal: Ability to maintain a clear airway and adequate ventilation will improve Outcome: Progressing   Problem: Role Relationship: Goal: Method of communication will improve Outcome: Progressing

## 2021-01-23 NOTE — Progress Notes (Signed)
Nutrition Follow-up  DOCUMENTATION CODES:   Not applicable  INTERVENTION:   - Continue Ensure Enlive po TID, each supplement provides 350 kcal and 20 grams of protein   - Encourage PO intake  NUTRITION DIAGNOSIS:   Increased nutrient needs related to wound healing as evidenced by estimated needs.  Ongoing, being addressed via oral nutrition supplements  GOAL:   Patient will meet greater than or equal to 90% of their needs  Progressing  MONITOR:   PO intake, Labs, Supplement acceptance, Diet advancement  REASON FOR ASSESSMENT:   Consult Enteral/tube feeding initiation and management  ASSESSMENT:   Pt with PMH of anxiety, PTSD, bipolar 1 disorder, depression, HTN, Sz, sleep apnea, and a smoker who was admitted for severe scoliosis with multi-level listhesis.  8/03 - s/p L1-2, L2-3, L3-4, L4-5, DLIFs 8/04 - T10-pelvis (10 procedure) 8/07 - started on CRRT 8/12 - CRRT stopped, tunneled HD catheter placed, Cortrak placed with tip gastric 8/15 - extubated and re-intubated later that day 8/16 - s/p trach 8/20 - trach decannulation secondary to malpositioned tracheostomy 8/22 - diet advanced to dysphagia 3 with thin liquids, HD catheter removed 8/23 - transitioned to nocturnal TF 8/30 - Cortrak removed, TF discontinued 9/08 - s/p thoracolumbar wound exploration, application of wound VAC  Spoke with pt at bedside. He reports that his appetite continues to improve. He states that he did not receive a lunch meal today so is eating leftover Chinese that he was brought yesterday. Noted pt refusing Ensure supplements today. Discussed with pt the importance of consuming oral nutrition supplements. Pt willing to go back to drinking at least 2 Ensure supplements daily. RD encouraged continued adequate PO intake.  First measured weight on 8/06: 104.9 kg Current weight as on 9/08: 95.8 kg  Meal Completion: 70-100% x last 8 meals  Medications reviewed and include: cholecalciferol,  Ensure Enlive QID, folic acid, lamictal, protonix, vitamin B-12, zinc sulfate  Labs reviewed: hemoglobin 9.2  UOP: 2150 ml x 24 hours Back JP drain: 70 ml x 24 hours Hemovac: 45 ml x 24 hours I/O's: +11.8 L since admit  Diet Order:   Diet Order             DIET DYS 3 Room service appropriate? Yes with Assist; Fluid consistency: Thin  Diet effective now                   EDUCATION NEEDS:   No education needs have been identified at this time  Skin:  Skin Assessment: Skin Integrity Issues: DTI: L heel Wound VAC: back  Last BM:  01/21/21 medium type 1  Height:   Ht Readings from Last 1 Encounters:  01/17/21 6' (1.829 m)    Weight:   Wt Readings from Last 1 Encounters:  01/17/21 95.8 kg    BMI:  Body mass index is 28.64 kg/m.  Estimated Nutritional Needs:   Kcal:  2200-2500  Protein:  110-120 g/day  Fluid:  >2 L/day    Gustavus Bryant, MS, RD, LDN Inpatient Clinical Dietitian Please see AMi[facility] for contact information.

## 2021-01-24 LAB — CBC
HCT: 30.6 % — ABNORMAL LOW (ref 39.0–52.0)
Hemoglobin: 9.7 g/dL — ABNORMAL LOW (ref 13.0–17.0)
MCH: 26.9 pg (ref 26.0–34.0)
MCHC: 31.7 g/dL (ref 30.0–36.0)
MCV: 84.8 fL (ref 80.0–100.0)
Platelets: 233 10*3/uL (ref 150–400)
RBC: 3.61 MIL/uL — ABNORMAL LOW (ref 4.22–5.81)
RDW: 15.6 % — ABNORMAL HIGH (ref 11.5–15.5)
WBC: 6.6 10*3/uL (ref 4.0–10.5)
nRBC: 0 % (ref 0.0–0.2)

## 2021-01-24 LAB — RENAL FUNCTION PANEL
Albumin: 3 g/dL — ABNORMAL LOW (ref 3.5–5.0)
Anion gap: 10 (ref 5–15)
BUN: 13 mg/dL (ref 6–20)
CO2: 26 mmol/L (ref 22–32)
Calcium: 9.9 mg/dL (ref 8.9–10.3)
Chloride: 99 mmol/L (ref 98–111)
Creatinine, Ser: 1.05 mg/dL (ref 0.61–1.24)
GFR, Estimated: >60 mL/min (ref >60)
Glucose, Bld: 111 mg/dL — ABNORMAL HIGH (ref 70–99)
Phosphorus: 4.6 mg/dL (ref 2.5–4.6)
Potassium: 4.5 mmol/L (ref 3.5–5.1)
Sodium: 135 mmol/L (ref 135–145)

## 2021-01-24 NOTE — Plan of Care (Signed)
  Problem: Education: Goal: Knowledge of General Education information will improve Description: Including pain rating scale, medication(s)/side effects and non-pharmacologic comfort measures Outcome: Progressing   Problem: Health Behavior/Discharge Planning: Goal: Ability to manage health-related needs will improve Outcome: Progressing   Problem: Clinical Measurements: Goal: Ability to maintain clinical measurements within normal limits will improve Outcome: Progressing Goal: Will remain free from infection Outcome: Progressing Goal: Diagnostic test results will improve Outcome: Progressing Goal: Respiratory complications will improve Outcome: Progressing Goal: Cardiovascular complication will be avoided Outcome: Progressing   Problem: Activity: Goal: Risk for activity intolerance will decrease Outcome: Progressing   Problem: Nutrition: Goal: Adequate nutrition will be maintained Outcome: Progressing   Problem: Coping: Goal: Level of anxiety will decrease Outcome: Progressing   Problem: Elimination: Goal: Will not experience complications related to bowel motility Outcome: Progressing Goal: Will not experience complications related to urinary retention Outcome: Progressing   Problem: Pain Managment: Goal: General experience of comfort will improve Outcome: Progressing   Problem: Safety: Goal: Ability to remain free from injury will improve Outcome: Progressing   Problem: Skin Integrity: Goal: Risk for impaired skin integrity will decrease Outcome: Progressing   Problem: Education: Goal: Knowledge of disease or condition will improve Outcome: Progressing Goal: Knowledge of secondary prevention will improve Outcome: Progressing Goal: Knowledge of patient specific risk factors addressed and post discharge goals established will improve Outcome: Progressing Goal: Individualized Educational Video(s) Outcome: Progressing   Problem: Coping: Goal: Will verbalize  positive feelings about self Outcome: Progressing Goal: Will identify appropriate support needs Outcome: Progressing   Problem: Health Behavior/Discharge Planning: Goal: Ability to manage health-related needs will improve Outcome: Progressing   Problem: Self-Care: Goal: Ability to participate in self-care as condition permits will improve Outcome: Progressing Goal: Verbalization of feelings and concerns over difficulty with self-care will improve Outcome: Progressing Goal: Ability to communicate needs accurately will improve Outcome: Progressing   Problem: Nutrition: Goal: Risk of aspiration will decrease Outcome: Progressing Goal: Dietary intake will improve Outcome: Progressing   Problem: Ischemic Stroke/TIA Tissue Perfusion: Goal: Complications of ischemic stroke/TIA will be minimized Outcome: Progressing   Problem: Activity: Goal: Ability to tolerate increased activity will improve Outcome: Progressing   Problem: Respiratory: Goal: Ability to maintain a clear airway and adequate ventilation will improve Outcome: Progressing   Problem: Role Relationship: Goal: Method of communication will improve Outcome: Progressing

## 2021-01-24 NOTE — Progress Notes (Signed)
Physical Therapy Treatment Patient Details Name: Keith Dixon MRN: [id number] DOB: [birth year] Today's Date: 01/24/2021   History of Present Illness Pt is a 60 y.o. male admitted 12/12/20 with back and BLE pain. S/p DLIF L1-L5 and T10-pelvis decompression and fusion 8/3; second stage of sx performed 8/4. Found to have RLE DVT, L IJ DVT; s/p IVC filter placement 8/6. Head CT 8/7 with bilateral cortical, L thalamic, bilateral cerebellar infarcts; suggestive of embolic disease. Brain MRI 8/13 with multiple acute/subacute infarcts in cerebellum,  thalami (L>R), bilateral cerebral hemispheres. Course complicated by AKI. ETT 8/4-8/15; reintubated 8/15. Trach placed 8/16. Underwent thoracolumbar wound exploration and application of wound vac on 9/8. PMH includes PTSD, HTN, OSA, bipolar, seizures, degenerative scoliosis, tobacco use.    PT Comments    Pt reports not feeling well today, but agreeable to attempt mobility. Pt requiring increased assist for bed mobility, and tolerated EOB x2 minutes only due to pain and fatigue. Pt requiring very increased time for pericare and cleanup after urinating and soiling bed. PT to continue to follow acutely.     Recommendations for follow up therapy are one component of a multi-disciplinary discharge planning process, led by the attending physician.  Recommendations may be updated based on patient status, additional functional criteria and insurance authorization.  Follow Up Recommendations  SNF;Supervision for mobility/OOB     Equipment Recommendations  Wheelchair (measurements PT);Wheelchair cushion (measurements PT);Rolling walker with 5" wheels;3in1 (PT);Hospital bed    Recommendations for Other Services       Precautions / Restrictions Precautions Precautions: Fall;Back Precaution Comments: spinal precautions, JP drain, wound vac, hemavac Required Braces or Orthoses: Spinal Brace Spinal Brace: Thoracolumbosacral orthotic;Applied in sitting  position Spinal Brace Comments: clamshell - unable to apply today, pt could not tolerate sitting EOB without returning to sidelying.     Mobility  Bed Mobility Overal bed mobility: Needs Assistance Bed Mobility: Rolling;Sidelying to Sit;Sit to Sidelying Rolling: Min assist Sidelying to sit: Mod assist     Sit to sidelying: Mod assist General bed mobility comments: min-mod assist for log roll technique to/from EOB for trunk elevation and LE management. very limited sitting tolerance with posterior leaning and x2 return to sidelying due to pain. bilat rolling for pericare, pt urinated on self when at EOB.    Transfers                 General transfer comment: unable, due to pain and fatigue  Ambulation/Gait                 Stairs             Wheelchair Mobility    Modified Rankin (Stroke Patients Only) Modified Rankin (Stroke Patients Only) Pre-Morbid Rankin Score: Slight disability Modified Rankin: Moderately severe disability     Balance Overall balance assessment: Needs assistance Sitting-balance support: Bilateral upper extremity supported;Feet supported Sitting balance-Leahy Scale: Fair Sitting balance - Comments: requires heavy use on UEs Postural control: Posterior lean                                  Cognition Arousal/Alertness: Awake/alert Behavior During Therapy: Restless Overall Cognitive Status: Impaired/Different from baseline Area of Impairment: Following commands;Safety/judgement;Problem solving                       Following Commands: Follows one step commands with increased time Safety/Judgement: Decreased awareness of safety;Decreased awareness of deficits  Problem Solving: Slow processing;Difficulty sequencing;Requires verbal cues General Comments: pt more lethargic today, periods of confusion and trying to remove gown.      Exercises      General Comments        Pertinent Vitals/Pain Pain  Assessment: Faces Faces Pain Scale: Hurts even more Pain Location: Lower back Pain Descriptors / Indicators: Grimacing;Discomfort;Guarding Pain Intervention(s): Limited activity within patient's tolerance;Monitored during session;Repositioned    Home Living                      Prior Function            PT Goals (current goals can now be found in the care plan section) Acute Rehab PT Goals Patient Stated Goal: to move better PT Goal Formulation: With patient Time For Goal Achievement: 01/31/21 Potential to Achieve Goals: Fair Progress towards PT goals: Progressing toward goals    Frequency    Min 5X/week      PT Plan Current plan remains appropriate    Co-evaluation              AM-PAC PT "6 Clicks" Mobility   Outcome Measure  Help needed turning from your back to your side while in a flat bed without using bedrails?: A Little Help needed moving from lying on your back to sitting on the side of a flat bed without using bedrails?: A Lot Help needed moving to and from a bed to a chair (including a wheelchair)?: A Lot Help needed standing up from a chair using your arms (e.g., wheelchair or bedside chair)?: A Lot Help needed to walk in hospital room?: A Lot Help needed climbing 3-5 steps with a railing? : Total 6 Click Score: 12    End of Session Equipment Utilized During Treatment: Back brace Activity Tolerance: Patient limited by fatigue Patient left: with call bell/phone within reach;in bed;with bed alarm set Nurse Communication: Mobility status PT Visit Diagnosis: Muscle weakness (generalized) (M62.81);Difficulty in walking, not elsewhere classified (R26.2);Other symptoms and signs involving the nervous system (R29.898);Hemiplegia and hemiparesis;Pain;Unsteadiness on feet (R26.81) Hemiplegia - Right/Left: Right Hemiplegia - dominant/non-dominant: Dominant Hemiplegia - caused by: Cerebral infarction     Time: [id number] PT Time Calculation (min)  (ACUTE ONLY): 31 min  Charges:  $Therapeutic Activity: 23-37 mins                    Stacie Glaze, PT DPT Acute Rehabilitation Services Pager [phone]  Office [phone]    Roxine Caddy E Ruffin Pyo 01/24/2021, 4:06 PM

## 2021-01-24 NOTE — Progress Notes (Signed)
Neurosurgery  VSS No overt seizures overnight. His drain output has slightly decreased.  I would like them to be < 25 ml prior to removal and discharge to SNF.  Keith Dixon can likely be removed later today.

## 2021-01-25 NOTE — Progress Notes (Signed)
Physical Therapy Treatment Patient Details Name: Keith Dixon MRN: [id number] DOB: 1960/11/08 Today's Date: 01/25/2021   History of Present Illness Pt is a 60 y.o. male admitted 12/12/20 with back and BLE pain. S/p DLIF L1-L5 and T10-pelvis decompression and fusion 8/3; second stage of sx performed 8/4. Found to have RLE DVT, L IJ DVT; s/p IVC filter placement 8/6. Head CT 8/7 with bilateral cortical, L thalamic, bilateral cerebellar infarcts; suggestive of embolic disease. Brain MRI 8/13 with multiple acute/subacute infarcts in cerebellum,  thalami (L>R), bilateral cerebral hemispheres. Course complicated by AKI. ETT 8/4-8/15; reintubated 8/15. Trach placed 8/16. Underwent thoracolumbar wound exploration and application of wound vac on 9/8. PMH includes PTSD, HTN, OSA, bipolar, seizures, degenerative scoliosis, tobacco use.    PT Comments    Progressing slowly still with significant pain, but pushing through. Used his soft TLSO as clamshell still too big.  Spoke by phone with orthotist who will bring new clamshell.  Patient appropriate for SNF and will continue to follow acutely.    Recommendations for follow up therapy are one component of a multi-disciplinary discharge planning process, led by the attending physician.  Recommendations may be updated based on patient status, additional functional criteria and insurance authorization.  Follow Up Recommendations  SNF;Supervision for mobility/OOB     Equipment Recommendations  Wheelchair (measurements PT);Wheelchair cushion (measurements PT);Rolling walker with 5" wheels;3in1 (PT);Hospital bed    Recommendations for Other Services       Precautions / Restrictions Precautions Precautions: Fall;Back Precaution Comments: spinal precautions, JP drain, wound vac, hemavac Spinal Brace: Thoracolumbosacral orthotic;Applied in sitting position Spinal Brace Comments: used off the shelf instead of clamshell due to poor fit of clamshell      Mobility  Bed Mobility Overal bed mobility: Needs Assistance Bed Mobility: Rolling;Sidelying to Sit Rolling: Mod assist;Min assist Sidelying to sit: Mod assist       General bed mobility comments: heavy assist to come upright due to pain with transitions    Transfers Overall transfer level: Needs assistance Equipment used: Rolling walker (2 wheeled) Transfers: Sit to/from Stand Sit to Stand: Mod assist;From elevated surface         General transfer comment: lifting help and increased time to stand from higher surface  Ambulation/Gait Ambulation/Gait assistance: Min assist;+2 safety/equipment;Mod assist Gait Distance (Feet): 80 Feet Assistive device: Rolling walker (2 wheeled) Gait Pattern/deviations: Step-through pattern;Decreased stride length;Antalgic     General Gait Details: heavy UE support on walker, assist under hips for safety if LE's buckle, but no buckling noted.   Stairs             Wheelchair Mobility    Modified Rankin (Stroke Patients Only) Modified Rankin (Stroke Patients Only) Pre-Morbid Rankin Score: Slight disability Modified Rankin: Moderately severe disability     Balance Overall balance assessment: Needs assistance Sitting-balance support: Bilateral upper extremity supported;Feet supported Sitting balance-Leahy Scale: Fair Sitting balance - Comments: UE support in sitting due to pain   Standing balance support: Bilateral upper extremity supported Standing balance-Leahy Scale: Poor                              Cognition Arousal/Alertness: Awake/alert Behavior During Therapy: WFL for tasks assessed/performed Overall Cognitive Status: Impaired/Different from baseline Area of Impairment: Safety/judgement;Problem solving;Memory                     Memory: Decreased short-term memory;Decreased recall of precautions Following Commands: Follows one step  commands with increased time Safety/Judgement: Decreased  awareness of safety   Problem Solving: Slow processing        Exercises      General Comments General comments (skin integrity, edema, etc.): dressing coming off on his back and loads of tape so redressed with smaller tape; called [employer] and spoke to [place] about brace and he will bring a new clamshell      Pertinent Vitals/Pain Pain Score: 10-Worst pain ever Pain Location: Lower back Pain Descriptors / Indicators: Grimacing;Discomfort;Guarding Pain Intervention(s): Monitored during session;Repositioned;Premedicated before session    Home Living                      Prior Function            PT Goals (current goals can now be found in the care plan section) Progress towards PT goals: Progressing toward goals    Frequency    Min 4X/week      PT Plan Current plan remains appropriate;Frequency needs to be updated    Co-evaluation              AM-PAC PT "6 Clicks" Mobility   Outcome Measure  Help needed turning from your back to your side while in a flat bed without using bedrails?: A Little Help needed moving from lying on your back to sitting on the side of a flat bed without using bedrails?: A Lot Help needed moving to and from a bed to a chair (including a wheelchair)?: A Lot Help needed standing up from a chair using your arms (e.g., wheelchair or bedside chair)?: A Lot Help needed to walk in hospital room?: A Lot Help needed climbing 3-5 steps with a railing? : Total 6 Click Score: 12    End of Session Equipment Utilized During Treatment: Back brace Activity Tolerance: Patient limited by fatigue Patient left: in chair;with call bell/phone within reach;with chair alarm set   PT Visit Diagnosis: Muscle weakness (generalized) (M62.81);Difficulty in walking, not elsewhere classified (R26.2);Other symptoms and signs involving the nervous system (R29.898);Hemiplegia and hemiparesis;Pain;Unsteadiness on feet (R26.81) Hemiplegia - Right/Left:  Right Hemiplegia - dominant/non-dominant: Dominant Hemiplegia - caused by: Cerebral infarction     Time: [id number] PT Time Calculation (min) (ACUTE ONLY): 30 min  Charges:  $Gait Training: 8-22 mins $Therapeutic Activity: 8-22 mins                     Magda Kiel, PT Acute Rehabilitation Services Pager:[phone] [phone] 01/25/2021    Reginia Naas 01/25/2021, 4:23 PM

## 2021-01-25 NOTE — Progress Notes (Signed)
  NEUROSURGERY PROGRESS NOTE   No issues overnight. Pt feels ok this am. Wants to be out of bed more.  EXAM:  BP 114/78 (BP Location: Right Arm)   Pulse 93   Temp 98.1 F (36.7 C) (Oral)   Resp 18   Ht 6' (1.829 m)   Wt 95.8 kg   SpO2 95%   BMI 28.64 kg/m   Awake, alert, oriented  Speech fluent, appropriate  CN grossly intact  5/5 BUE/BLE  Subcutaneous and subfacial drains in place, 110cc / 60cc  IMPRESSION:  60 y.o. male s/p lumbar wound revision. Drains remain with increased output  PLAN: - Cont PO Bactrim - Will cont both drains today - Cont to mobilize with PT/OT - SNF placement when drain output decreases to <25cc/24hrs   Consuella Lose, MD [facility]

## 2021-01-26 NOTE — Progress Notes (Signed)
Subjective: Patient reports feeling better decrease pain  Objective: Vital signs in last 24 hours: Temp:  [97.8 F (36.6 C)-98.9 F (37.2 C)] 98.4 F (36.9 C) (09/17 0726) Pulse Rate:  [81-100] 92 (09/17 0726) Resp:  [18-20] 18 (09/17 0726) BP: (136-168)/(92-109) 155/97 (09/17 0726) SpO2:  [93 %-99 %] 96 % (09/17 0726)  Intake/Output from previous day: 09/16 0701 - 09/17 0700 In: 480 [P.O.:480] Out: 1980 [Urine:1850; Drains:130] Intake/Output this shift: No intake/output data recorded.  Awake and alert moves all extremities well incision clean dry and intact mild amount of drainage improving  Lab Results: Recent Labs    01/24/21 0539  WBC 6.6  HGB 9.7*  HCT 30.6*  PLT 233   BMET Recent Labs    01/24/21 0539  NA 135  K 4.5  CL 99  CO2 26  GLUCOSE 111*  BUN 13  CREATININE 1.05  CALCIUM 9.9    Studies/Results: No results found.  Assessment/Plan: Status post wound revision doing fairly well continue antibiotics drain output was 40 cc last 24 hours we will discuss with Nundkumar timing of removal.  Continue to mobilize with physical and Occupational Therapy  LOS: 45 days     Elaina Hoops 01/26/2021, 8:17 AM

## 2021-01-27 LAB — RENAL FUNCTION PANEL
Albumin: 3.1 g/dL — ABNORMAL LOW (ref 3.5–5.0)
Anion gap: 11 (ref 5–15)
BUN: 20 mg/dL (ref 6–20)
CO2: 22 mmol/L (ref 22–32)
Calcium: 10.4 mg/dL — ABNORMAL HIGH (ref 8.9–10.3)
Chloride: 99 mmol/L (ref 98–111)
Creatinine, Ser: 1.24 mg/dL (ref 0.61–1.24)
GFR, Estimated: >60 mL/min (ref >60)
Glucose, Bld: 117 mg/dL — ABNORMAL HIGH (ref 70–99)
Phosphorus: 4.7 mg/dL — ABNORMAL HIGH (ref 2.5–4.6)
Potassium: 4.7 mmol/L (ref 3.5–5.1)
Sodium: 132 mmol/L — ABNORMAL LOW (ref 135–145)

## 2021-01-27 LAB — CBC
HCT: 32.1 % — ABNORMAL LOW (ref 39.0–52.0)
Hemoglobin: 10.5 g/dL — ABNORMAL LOW (ref 13.0–17.0)
MCH: 27.2 pg (ref 26.0–34.0)
MCHC: 32.7 g/dL (ref 30.0–36.0)
MCV: 83.2 fL (ref 80.0–100.0)
Platelets: 293 10*3/uL (ref 150–400)
RBC: 3.86 MIL/uL — ABNORMAL LOW (ref 4.22–5.81)
RDW: 15.6 % — ABNORMAL HIGH (ref 11.5–15.5)
WBC: 7.6 10*3/uL (ref 4.0–10.5)
nRBC: 0 % (ref 0.0–0.2)

## 2021-01-27 NOTE — Progress Notes (Signed)
  NEUROSURGERY PROGRESS NOTE   No issues overnight. No complaints this am.  EXAM:  BP (!) 167/94   Pulse 85   Temp 97.9 F (36.6 C) (Oral)   Resp 20   Ht 6' (1.829 m)   Wt 95.8 kg   SpO2 100%   BMI 28.64 kg/m   Awake, alert, oriented  Speech fluent, appropriate  CN grossly intact  5/5 BUE/BLE  Hemovac - 0cc JP - 90cc  IMPRESSION:  60 y.o. male s/p lumbar wound revision, awaiting placement. Wound remains stable  PLAN: - d/c hemovac, maintain JP - Cont Bactrim - Cont to mobilize - TLSO brace - Hopefully SNF placement this week    Consuella Lose, MD [facility]

## 2021-01-28 NOTE — Progress Notes (Signed)
Physical Therapy Treatment Patient Details Name: Keith Dixon MRN: [id number] DOB: January 22, 1961 Today's Date: 01/28/2021   History of Present Illness Pt is a 60 y.o. male admitted 12/12/20 with back and BLE pain. S/p DLIF L1-L5 and T10-pelvis decompression and fusion 8/3; second stage of sx performed 8/4. Found to have RLE DVT, L IJ DVT; s/p IVC filter placement 8/6. Head CT 8/7 with bilateral cortical, L thalamic, bilateral cerebellar infarcts; suggestive of embolic disease. Brain MRI 8/13 with multiple acute/subacute infarcts in cerebellum,  thalami (L>R), bilateral cerebral hemispheres. Course complicated by AKI. ETT 8/4-8/15; reintubated 8/15. Trach placed 8/16. Underwent thoracolumbar wound exploration and application of wound vac on 9/8. PMH includes PTSD, HTN, OSA, bipolar, seizures, degenerative scoliosis, tobacco use.    PT Comments    Pt more alert and verbally engaged throughout session today, talking about the diet he needs to work towards after d/c, ie eating vegetables. Pt continues to need mod A for sitting up at EOB due to difficulty wt shifting from SL to upright. New clamshell brace fitting much better than last one. Pt ambulated 50' with min A, LE's trembling with fatigue last 10'. Worked on erect posture as well as widening BOS. PT will continue to follow.    Recommendations for follow up therapy are one component of a multi-disciplinary discharge planning process, led by the attending physician.  Recommendations may be updated based on patient status, additional functional criteria and insurance authorization.  Follow Up Recommendations  SNF;Supervision for mobility/OOB     Equipment Recommendations  Wheelchair (measurements PT);Wheelchair cushion (measurements PT);Rolling walker with 5" wheels;3in1 (PT);Hospital bed    Recommendations for Other Services Rehab consult     Precautions / Restrictions Precautions Precautions: Fall;Back Precaution Booklet Issued:  No Precaution Comments: spinal precautions, JP drain Required Braces or Orthoses: Spinal Brace Spinal Brace: Thoracolumbosacral orthotic;Applied in sitting position Spinal Brace Comments: clamshell Restrictions Weight Bearing Restrictions: No     Mobility  Bed Mobility Overal bed mobility: Needs Assistance Bed Mobility: Rolling;Sidelying to Sit Rolling: Min guard Sidelying to sit: Mod assist       General bed mobility comments: pt able to verbalize precautions and keep with rolling. Mod A needed for wt shift from R to L, helped to cue pt to reach towards foot of bed    Transfers Overall transfer level: Needs assistance Equipment used: Rolling walker (2 wheeled) Transfers: Sit to/from Stand Sit to Stand: Mod assist;From elevated surface;Min assist Stand pivot transfers: Min assist       General transfer comment: practiced sit<>stand from bed and recliner working on fwd wt shift with the stand and controlling descent with LE's on the sit  Ambulation/Gait Ambulation/Gait assistance: Min assist Gait Distance (Feet): 50 Feet Assistive device: Rolling walker (2 wheeled) Gait Pattern/deviations: Step-through pattern;Decreased stride length;Antalgic;Narrow base of support Gait velocity: decr Gait velocity interpretation: <1.8 ft/sec, indicate of risk for recurrent falls General Gait Details: worked on erect posture and widening BOS, LE's trembling last 10'   Stairs             Wheelchair Mobility    Modified Rankin (Stroke Patients Only) Modified Rankin (Stroke Patients Only) Pre-Morbid Rankin Score: Slight disability Modified Rankin: Moderately severe disability     Balance Overall balance assessment: Needs assistance Sitting-balance support: Bilateral upper extremity supported;Feet supported Sitting balance-Leahy Scale: Fair Sitting balance - Comments: UE support in sitting due to pain Postural control: Posterior lean Standing balance support: Bilateral  upper extremity supported Standing balance-Leahy Scale: Poor Standing balance  comment: Reliant on BUE support                            Cognition Arousal/Alertness: Awake/alert Behavior During Therapy: WFL for tasks assessed/performed Overall Cognitive Status: Impaired/Different from baseline Area of Impairment: Safety/judgement;Problem solving;Memory                   Current Attention Level: Selective Memory: Decreased short-term memory;Decreased recall of precautions Following Commands: Follows one step commands with increased time Safety/Judgement: Decreased awareness of safety Awareness: Emergent Problem Solving: Slow processing General Comments: pt alert and more verbal today, engaging in higher level conversation, discussing his need to eat vegetables and how he went a year only eating baked chicken      Exercises Total Joint Exercises Bridges: 5 reps (just the pelvic tilt) General Exercises - Lower Extremity Ankle Circles/Pumps: AROM;Both;10 reps;Seated Short Arc Quad: AROM;Both;10 reps;Seated Long Arc Quad: AROM;Both;15 reps;Seated Heel Slides: AROM;Both;10 reps;Supine Straight Leg Raises: AROM;Both;5 reps;Supine    General Comments        Pertinent Vitals/Pain Pain Assessment: Faces Faces Pain Scale: Hurts even more Pain Location: Lower back Pain Descriptors / Indicators: Grimacing;Discomfort;Guarding Pain Intervention(s): Limited activity within patient's tolerance;Monitored during session    Home Living                      Prior Function            PT Goals (current goals can now be found in the care plan section) Acute Rehab PT Goals Patient Stated Goal: to move better PT Goal Formulation: With patient Time For Goal Achievement: 01/31/21 Potential to Achieve Goals: Fair Progress towards PT goals: Progressing toward goals    Frequency    Min 4X/week      PT Plan Current plan remains appropriate     Co-evaluation              AM-PAC PT "6 Clicks" Mobility   Outcome Measure  Help needed turning from your back to your side while in a flat bed without using bedrails?: A Little Help needed moving from lying on your back to sitting on the side of a flat bed without using bedrails?: A Lot Help needed moving to and from a bed to a chair (including a wheelchair)?: A Lot Help needed standing up from a chair using your arms (e.g., wheelchair or bedside chair)?: A Lot Help needed to walk in hospital room?: A Lot Help needed climbing 3-5 steps with a railing? : Total 6 Click Score: 12    End of Session Equipment Utilized During Treatment: Back brace Activity Tolerance: Patient tolerated treatment well Patient left: in chair;with call bell/phone within reach;with chair alarm set Nurse Communication: Mobility status PT Visit Diagnosis: Muscle weakness (generalized) (M62.81);Difficulty in walking, not elsewhere classified (R26.2);Other symptoms and signs involving the nervous system (R29.898);Hemiplegia and hemiparesis;Pain;Unsteadiness on feet (R26.81) Hemiplegia - Right/Left: Right Hemiplegia - dominant/non-dominant: Dominant Hemiplegia - caused by: Cerebral infarction Pain - Right/Left:  (incisional) Pain - part of body:  (back)     Time: [id number] PT Time Calculation (min) (ACUTE ONLY): 36 min  Charges:  $Gait Training: 8-22 mins $Therapeutic Exercise: 8-22 mins                     [place]  Pager [phone] Office [place] 01/28/2021, 2:48 PM

## 2021-01-28 NOTE — Progress Notes (Signed)
Subjective: Patient reports pain well-controlled  Objective: Vital signs in last 24 hours: Temp:  [97.7 F (36.5 C)-99.1 F (37.3 C)] 98.3 F (36.8 C) (09/19 1138) Pulse Rate:  [79-99] 89 (09/19 1138) Resp:  [16] 16 (09/19 1138) BP: (140-161)/(94-105) 153/97 (09/19 1138) SpO2:  [96 %-100 %] 98 % (09/19 1138)  Intake/Output from previous day: 09/18 0701 - 09/19 0700 In: -  Out: 480 [Urine:400; Drains:80] Intake/Output this shift: Total I/O In: -  Out: 565 [Urine:550; Drains:15]  NAD Full neurologic strength in Les JP output serosanguinous  Lab Results: Recent Labs    01/27/21 0511  WBC 7.6  HGB 10.5*  HCT 32.1*  PLT 293   BMET Recent Labs    01/27/21 0511  NA 132*  K 4.7  CL 99  CO2 22  GLUCOSE 117*  BUN 20  CREATININE 1.24  CALCIUM 10.4*    Studies/Results: No results found.  A/P:  - will remove JP when output < 50 ml over 24 hours, hopefully tomorrow, after which he will be ready for discharge to SNF.   Vallarie Mare 01/28/2021, 2:09 PM

## 2021-01-29 LAB — SARS CORONAVIRUS 2 (TAT 6-24 HRS): SARS Coronavirus 2: NEGATIVE

## 2021-01-29 NOTE — Progress Notes (Signed)
Physical Therapy Treatment Patient Details Name: Keith Dixon MRN: [id number] DOB: 27-Jun-1960 Today's Date: 01/29/2021   History of Present Illness Pt is a 60 y.o. male admitted 12/12/20 with back and BLE pain. S/p DLIF L1-L5 and T10-pelvis decompression and fusion 8/3; second stage of sx performed 8/4. Found to have RLE DVT, L IJ DVT; s/p IVC filter placement 8/6. Head CT 8/7 with bilateral cortical, L thalamic, bilateral cerebellar infarcts; suggestive of embolic disease. Brain MRI 8/13 with multiple acute/subacute infarcts in cerebellum,  thalami (L>R), bilateral cerebral hemispheres. Course complicated by AKI. ETT 8/4-8/15; reintubated 8/15. Trach placed 8/16. Underwent thoracolumbar wound exploration and application of wound vac on 9/8. PMH includes PTSD, HTN, OSA, bipolar, seizures, degenerative scoliosis, tobacco use.    PT Comments    Pt showing good motivation despite fatigue and having been up earlier with OT. Pt continues to need assist to come to EOB due to difficulty coming from SL to sitting. Pt assisted with donning brace in sitting. Pt ambulated 100' with chair behind and min A. LE's fatigued at that point so seated rest was taken. Pt able to ambulate another 53' with RW after this. PT will continue to follow.    Recommendations for follow up therapy are one component of a multi-disciplinary discharge planning process, led by the attending physician.  Recommendations may be updated based on patient status, additional functional criteria and insurance authorization.  Follow Up Recommendations  SNF;Supervision for mobility/OOB     Equipment Recommendations  Wheelchair (measurements PT);Wheelchair cushion (measurements PT);Rolling walker with 5" wheels;3in1 (PT);Hospital bed    Recommendations for Other Services Rehab consult     Precautions / Restrictions Precautions Precautions: Fall;Back Precaution Booklet Issued: No Precaution Comments: spinal precautions, JP  drain Required Braces or Orthoses: Spinal Brace Spinal Brace: Thoracolumbosacral orthotic;Applied in sitting position Spinal Brace Comments: clamshell Restrictions Weight Bearing Restrictions: No     Mobility  Bed Mobility Overal bed mobility: Needs Assistance Bed Mobility: Rolling;Sidelying to Sit Rolling: Supervision Sidelying to sit: Min assist       General bed mobility comments: pt able to roll to R with supervision, bridging knees first. Attempted SL to sit but was unable to complete last 25%, needed min HHA to fully shift wt from R to L    Transfers Overall transfer level: Needs assistance Equipment used: Rolling walker (2 wheeled) Transfers: Sit to/from Stand Sit to Stand: Mod assist;From elevated surface;Min assist         General transfer comment: needed mod A from bed and recliner for power up and fwd wt shift  Ambulation/Gait Ambulation/Gait assistance: Min assist Gait Distance (Feet): 100 Feet (then another 50 after seated rest) Assistive device: Rolling walker (2 wheeled) Gait Pattern/deviations: Step-through pattern;Decreased stride length;Antalgic;Narrow base of support Gait velocity: decr Gait velocity interpretation: <1.8 ft/sec, indicate of risk for recurrent falls General Gait Details: pt needing cueing for self monitoring   Stairs             Wheelchair Mobility    Modified Rankin (Stroke Patients Only) Modified Rankin (Stroke Patients Only) Pre-Morbid Rankin Score: Slight disability Modified Rankin: Moderately severe disability     Balance Overall balance assessment: Needs assistance Sitting-balance support: Bilateral upper extremity supported;Feet supported Sitting balance-Leahy Scale: Fair Sitting balance - Comments: UE support in sitting due to pain Postural control: Posterior lean Standing balance support: Bilateral upper extremity supported Standing balance-Leahy Scale: Poor Standing balance comment: pt maintained static  standing while putting face mask on to go out of  room with min A                            Cognition Arousal/Alertness: Awake/alert Behavior During Therapy: WFL for tasks assessed/performed Overall Cognitive Status: Impaired/Different from baseline Area of Impairment: Safety/judgement;Problem solving                   Current Attention Level: Selective   Following Commands: Follows one step commands with increased time Safety/Judgement: Decreased awareness of safety Awareness: Emergent Problem Solving: Slow processing;Requires verbal cues;Difficulty sequencing General Comments: pt remembering precautions this afternoon and to widen BOS with ambulation which we worked on yesterday      Exercises      General Comments General comments (skin integrity, edema, etc.): VSS. Took pt to lobby to see outside as he is getting quite tired of the hospital after being here 48 days per his report.      Pertinent Vitals/Pain Pain Assessment: Faces Pain Score: 2  Pain Location: Lower back Pain Descriptors / Indicators: Grimacing;Discomfort;Guarding Pain Intervention(s): Limited activity within patient's tolerance;Monitored during session    Home Living                      Prior Function            PT Goals (current goals can now be found in the care plan section) Acute Rehab PT Goals Patient Stated Goal: to move better PT Goal Formulation: With patient Time For Goal Achievement: 01/31/21 Potential to Achieve Goals: Good Progress towards PT goals: Progressing toward goals    Frequency    Min 4X/week      PT Plan Current plan remains appropriate    Co-evaluation PT/OT/SLP Co-Evaluation/Treatment: Yes            AM-PAC PT "6 Clicks" Mobility   Outcome Measure  Help needed turning from your back to your side while in a flat bed without using bedrails?: A Little Help needed moving from lying on your back to sitting on the side of a flat bed  without using bedrails?: A Lot Help needed moving to and from a bed to a chair (including a wheelchair)?: A Lot Help needed standing up from a chair using your arms (e.g., wheelchair or bedside chair)?: A Lot Help needed to walk in hospital room?: A Lot Help needed climbing 3-5 steps with a railing? : Total 6 Click Score: 12    End of Session Equipment Utilized During Treatment: Back brace Activity Tolerance: Patient tolerated treatment well Patient left: in chair;with call bell/phone within reach;with chair alarm set Nurse Communication: Mobility status PT Visit Diagnosis: Muscle weakness (generalized) (M62.81);Difficulty in walking, not elsewhere classified (R26.2);Other symptoms and signs involving the nervous system (R29.898);Hemiplegia and hemiparesis;Pain;Unsteadiness on feet (R26.81) Hemiplegia - Right/Left: Right Hemiplegia - dominant/non-dominant: Dominant Hemiplegia - caused by: Cerebral infarction Pain - Right/Left:  (incisional) Pain - part of body:  (back)     Time: [id number] PT Time Calculation (min) (ACUTE ONLY): 34 min  Charges:  $Gait Training: 23-37 mins                     [place]  Pager [phone] Office [place] 01/29/2021, 3:15 PM

## 2021-01-29 NOTE — Progress Notes (Signed)
Neurosurgery NAEs o/n NAD Full neurologic strength in legs Incision c/d Drain output 35 ml  - will dc drain today, staple removal tomorrow - ready for dc to SNF

## 2021-01-29 NOTE — Progress Notes (Addendum)
Pt temp 100.7. MD notified as pt was expected to d/c tomorrow.

## 2021-01-29 NOTE — Progress Notes (Signed)
Pt vomited X1. Pt complaining that he feels bloated. Last BM documented, prior to one this morning, on 9/12. Pt stating that he still feels like he needs to have BM. PRN meds given earlier. Pt agreeable to enema and successfully had large BM.

## 2021-01-29 NOTE — Progress Notes (Signed)
Occupational Therapy Treatment Patient Details Name: Keith Dixon MRN: [id number] DOB: [birth year] Today's Date: 01/29/2021   History of present illness Pt is a 60 y.o. male admitted 12/12/20 with back and BLE pain. S/p DLIF L1-L5 and T10-pelvis decompression and fusion 8/3; second stage of sx performed 8/4. Found to have RLE DVT, L IJ DVT; s/p IVC filter placement 8/6. Head CT 8/7 with bilateral cortical, L thalamic, bilateral cerebellar infarcts; suggestive of embolic disease. Brain MRI 8/13 with multiple acute/subacute infarcts in cerebellum,  thalami (L>R), bilateral cerebral hemispheres. Course complicated by AKI. ETT 8/4-8/15; reintubated 8/15. Trach placed 8/16. Underwent thoracolumbar wound exploration and application of wound vac on 9/8. PMH includes PTSD, HTN, OSA, bipolar, seizures, degenerative scoliosis, tobacco use.   OT comments  Pt progressing well, updated OT goals.  Requires mod assist for bed mobility, total assist for LB dressing and min assist for toilet transfers using RW.  He continues to require cueing for spinal precautions, limited by pain, balance and weakness.  Increased time for all activities today, pt reporting he normally doesn't get up this early but he was agreeable to OOB for breakfast. Will follow acutely.    Recommendations for follow up therapy are one component of a multi-disciplinary discharge planning process, led by the attending physician.  Recommendations may be updated based on patient status, additional functional criteria and insurance authorization.    Follow Up Recommendations  SNF    Equipment Recommendations  3 in 1 bedside commode;Other (comment) (RW)    Recommendations for Other Services      Precautions / Restrictions Precautions Precautions: Fall;Back Precaution Booklet Issued: No Precaution Comments: spinal precautions, JP drain Required Braces or Orthoses: Spinal Brace Spinal Brace: Thoracolumbosacral orthotic;Applied in sitting  position Spinal Brace Comments: clamshell Restrictions Weight Bearing Restrictions: No       Mobility Bed Mobility Overal bed mobility: Needs Assistance Bed Mobility: Rolling;Sidelying to Sit Rolling: Min assist Sidelying to sit: Mod assist       General bed mobility comments: pt requires cueing for technique and mod assist to ascend trunk, limited by pain    Transfers                      Balance Overall balance assessment: Needs assistance Sitting-balance support: Bilateral upper extremity supported;Feet supported Sitting balance-Leahy Scale: Fair Sitting balance - Comments: UE support in sitting due to pain Postural control: Posterior lean Standing balance support: Bilateral upper extremity supported Standing balance-Leahy Scale: Poor Standing balance comment: Reliant on BUE support                           ADL either performed or assessed with clinical judgement   ADL Overall ADL's : Needs assistance/impaired                 Upper Body Dressing : Minimal assistance;Sitting   Lower Body Dressing: Total assistance;Sit to/from stand Lower Body Dressing Details (indicate cue type and reason): total assist for socks, min assist sit to stand but relies on BUE support Toilet Transfer: Minimal assistance;Ambulation;RW Toilet Transfer Details (indicate cue type and reason): to recliner         Functional mobility during ADLs: Minimal assistance;Moderate assistance General ADL Comments: limited by pain and weakness     Vision       Perception     Praxis      Cognition Arousal/Alertness: Awake/alert Behavior During Therapy: WFL for tasks assessed/performed Overall  Cognitive Status: Impaired/Different from baseline Area of Impairment: Safety/judgement;Memory;Problem solving                     Memory: Decreased recall of precautions;Decreased short-term memory   Safety/Judgement: Decreased awareness of safety   Problem  Solving: Slow processing;Requires verbal cues;Difficulty sequencing General Comments: pt with no recall of back precautions initally, but able to recall during session after reinforcement. Pt with decreased awareness to safety and cueing for sequencing this morning.  Reports fatigued and typically doesn't get up early.        Exercises     Shoulder Instructions       General Comments      Pertinent Vitals/ Pain       Pain Assessment: Faces Faces Pain Scale: Hurts even more Pain Location: Lower back Pain Descriptors / Indicators: Grimacing;Discomfort;Guarding Pain Intervention(s): Limited activity within patient's tolerance;Monitored during session;Premedicated before session;Repositioned  Home Living                                          Prior Functioning/Environment              Frequency  Min 2X/week        Progress Toward Goals  OT Goals(current goals can now be found in the care plan section)  Progress towards OT goals: Progressing toward goals  Acute Rehab OT Goals Patient Stated Goal: to move better OT Goal Formulation: With patient ADL Goals Pt Will Perform Grooming: with modified independence;sitting Additional ADL Goal #1: Pt will complete bed mobility with min assist. Additional ADL Goal #2: Pt will tolerate dynamic sitting balance with min assist for 10 minutes. Additional ADL Goal #3: Pt will complete transfer with min guard assist.  Plan Discharge plan remains appropriate;Frequency remains appropriate    Co-evaluation                 AM-PAC OT "6 Clicks" Daily Activity     Outcome Measure   Help from another person eating meals?: A Little Help from another person taking care of personal grooming?: A Little Help from another person toileting, which includes using toliet, bedpan, or urinal?: A Little Help from another person bathing (including washing, rinsing, drying)?: A Lot Help from another person to put on and  taking off regular upper body clothing?: A Little Help from another person to put on and taking off regular lower body clothing?: A Lot 6 Click Score: 16    End of Session Equipment Utilized During Treatment: Rolling walker;Back brace  OT Visit Diagnosis: Unsteadiness on feet (R26.81);Muscle weakness (generalized) (M62.81);Pain Pain - part of body:  (back)   Activity Tolerance Patient tolerated treatment well   Patient Left in chair;with call bell/phone within reach;with chair alarm set   Nurse Communication Mobility status;Precautions        Time: [phone] OT Time Calculation (min): 31 min  Charges: OT General Charges $OT Visit: 1 Visit OT Treatments $Self Care/Home Management : 23-37 mins  [place] Pager [phone] Office [phone]   Delight Stare 01/29/2021, 10:22 AM

## 2021-01-29 NOTE — TOC Progression Note (Signed)
Transition of Care [facility]) - Progression Note    Patient Details  Name: MELVEN STOCKARD MRN: [id number] Date of Birth: 1960-06-14  Transition of Care [facility]) CM/SW [place], [place] Phone Number: 01/29/2021, 3:04 PM  Clinical Narrative:     [facility] confirmed they have availability and can accept patient. CSW informed anticipate d/c tomorrow. Updated RN- requested covid test   Thurmond Butts, MSW, LCSW Clinical Social Worker    Expected Discharge Plan: Skilled Nursing Facility Barriers to Discharge: SNF Pending bed offer  Expected Discharge Plan and Services Expected Discharge Plan: [place] In-house Referral: Clinical Social Work     Living arrangements for the past 2 months: Single Family Home                                       Social Determinants of Health (SDOH) Interventions    Readmission Risk Interventions No flowsheet data found.

## 2021-01-30 LAB — CBC
HCT: 33 % — ABNORMAL LOW (ref 39.0–52.0)
Hemoglobin: 10.6 g/dL — ABNORMAL LOW (ref 13.0–17.0)
MCH: 26.7 pg (ref 26.0–34.0)
MCHC: 32.1 g/dL (ref 30.0–36.0)
MCV: 83.1 fL (ref 80.0–100.0)
Platelets: 339 10*3/uL (ref 150–400)
RBC: 3.97 MIL/uL — ABNORMAL LOW (ref 4.22–5.81)
RDW: 15.4 % (ref 11.5–15.5)
WBC: 7.3 10*3/uL (ref 4.0–10.5)
nRBC: 0 % (ref 0.0–0.2)

## 2021-01-30 LAB — RENAL FUNCTION PANEL
Albumin: 3.3 g/dL — ABNORMAL LOW (ref 3.5–5.0)
Anion gap: 12 (ref 5–15)
BUN: 24 mg/dL — ABNORMAL HIGH (ref 6–20)
CO2: 23 mmol/L (ref 22–32)
Calcium: 10.3 mg/dL (ref 8.9–10.3)
Chloride: 97 mmol/L — ABNORMAL LOW (ref 98–111)
Creatinine, Ser: 1.28 mg/dL — ABNORMAL HIGH (ref 0.61–1.24)
GFR, Estimated: >60 mL/min (ref >60)
Glucose, Bld: 112 mg/dL — ABNORMAL HIGH (ref 70–99)
Phosphorus: 4.8 mg/dL — ABNORMAL HIGH (ref 2.5–4.6)
Potassium: 4.7 mmol/L (ref 3.5–5.1)
Sodium: 132 mmol/L — ABNORMAL LOW (ref 135–145)

## 2021-01-30 NOTE — Progress Notes (Signed)
Nutrition Follow-up  DOCUMENTATION CODES:   Not applicable  INTERVENTION:   - Continue Ensure Enlive po TID, each supplement provides 350 kcal and 20 grams of protein   - Encourage PO intake  NUTRITION DIAGNOSIS:   Increased nutrient needs related to wound healing as evidenced by estimated needs.  Ongoing, being addressed via oral nutrition supplements  GOAL:   Patient will meet greater than or equal to 90% of their needs  Progressing  MONITOR:   PO intake, Labs, Supplement acceptance, Diet advancement  REASON FOR ASSESSMENT:   Consult Enteral/tube feeding initiation and management  ASSESSMENT:   Pt with PMH of anxiety, PTSD, bipolar 1 disorder, depression, HTN, Sz, sleep apnea, and a smoker who was admitted for severe scoliosis with multi-level listhesis.  8/03 - s/p L1-2, L2-3, L3-4, L4-5, DLIFs 8/04 - T10-pelvis (10 procedure) 8/07 - started on CRRT 8/12 - CRRT stopped, tunneled HD catheter placed, Cortrak placed with tip gastric 8/15 - extubated and re-intubated later that day 8/16 - s/p trach 8/20 - trach decannulation secondary to malpositioned tracheostomy 8/22 - diet advanced to dysphagia 3 with thin liquids, HD catheter removed 8/23 - transitioned to nocturnal TF 8/30 - Cortrak removed, TF discontinued 9/08 - s/p thoracolumbar wound exploration, application of wound VAC  Noted plan for pt to d/c to SNF for short-term rehab tomorrow. JP drain removed.  Noted pt with one episode of emesis yesterday. Pt felt constipated and had a large BM after enema administered. PO intake so far today recorded as 50% for breakfast meal. Pt accepting most Ensure supplements per [facility] documentation.  No new weights since 9/08. Per nursing edema assessment, pt with non-pitting edema to BLE.  Meal Completion: 10-100% x last 8 recorded meals  Medications reviewed and include: cholecalciferol, Ensure Enlive TID, folic acid, protonix, vitamin B-12, zinc sulfate 220 mg  daily  Labs reviewed: sodium 132, BUN 24, creatinine 1.28, phosphorus 4.8  UOP: 1050 ml x 24 hours  Diet Order:   Diet Order             DIET DYS 3 Room service appropriate? Yes with Assist; Fluid consistency: Thin  Diet effective now                   EDUCATION NEEDS:   No education needs have been identified at this time  Skin:  Skin Assessment: Skin Integrity Issues: Skin Integrity Issues:: DTI, Incisions DTI: L heel Incisions: back  Last BM:  01/29/21 large type5/type 7  Height:   Ht Readings from Last 1 Encounters:  01/17/21 6' (1.829 m)    Weight:   Wt Readings from Last 1 Encounters:  01/17/21 95.8 kg    BMI:  Body mass index is 28.64 kg/m.  Estimated Nutritional Needs:   Kcal:  2200-2500  Protein:  110-120 g/d  Fluid:  >2 L/day    Gustavus Bryant, MS, RD, LDN Inpatient Clinical Dietitian Please see AMiON for contact information.

## 2021-01-30 NOTE — Progress Notes (Signed)
Physical Therapy Treatment Patient Details Name: Keith Dixon MRN: [id number] DOB: October 13, 1960 Today's Date: 01/30/2021   History of Present Illness Pt is a 60 y.o. male admitted 12/12/20 with back and BLE pain. S/p DLIF L1-L5 and T10-pelvis decompression and fusion 8/3; second stage of sx performed 8/4. Found to have RLE DVT, L IJ DVT; s/p IVC filter placement 8/6. Head CT 8/7 with bilateral cortical, L thalamic, bilateral cerebellar infarcts; suggestive of embolic disease. Brain MRI 8/13 with multiple acute/subacute infarcts in cerebellum,  thalami (L>R), bilateral cerebral hemispheres. Course complicated by AKI. ETT 8/4-8/15; reintubated 8/15. Trach placed 8/16. Underwent thoracolumbar wound exploration and application of wound vac on 9/8. PMH includes PTSD, HTN, OSA, bipolar, seizures, degenerative scoliosis, tobacco use.    PT Comments    Progressing with further ambulation this session and though legs look shaky, no buckling.  Patient remains motivated and working for progression.  Continue to feel he is appropriate for SNF level rehab at d/c.  PT to continue to follow acutely.    Recommendations for follow up therapy are one component of a multi-disciplinary discharge planning process, led by the attending physician.  Recommendations may be updated based on patient status, additional functional criteria and insurance authorization.  Follow Up Recommendations  SNF;Supervision for mobility/OOB     Equipment Recommendations  Wheelchair (measurements PT);Wheelchair cushion (measurements PT);Rolling walker with 5" wheels;3in1 (PT);Hospital bed    Recommendations for Other Services       Precautions / Restrictions Precautions Precautions: Fall;Back Required Braces or Orthoses: Spinal Brace Spinal Brace: Thoracolumbosacral orthotic;Applied in sitting position Spinal Brace Comments: clamshell     Mobility  Bed Mobility Overal bed mobility: Needs Assistance   Rolling:  Supervision Sidelying to sit: Min assist;HOB elevated       General bed mobility comments: attempted to rise from flat bed, but unable to pt assisted by elevating HOB and using rail with A for balance/positioning    Transfers Overall transfer level: Needs assistance Equipment used: Rolling walker (2 wheeled) Transfers: Sit to/from Stand Sit to Stand: From elevated surface;Min assist;Mod assist         General transfer comment: increased time, difficulty getting both hands to walker  Ambulation/Gait Ambulation/Gait assistance: Min assist Gait Distance (Feet): 75 Feet (x 2) Assistive device: Rolling walker (2 wheeled) Gait Pattern/deviations: Step-through pattern;Step-to pattern;Decreased stride length;Antalgic     General Gait Details: heavy UE reliance, but made it all the way around unit; chair close in case   Stairs             Wheelchair Mobility    Modified Rankin (Stroke Patients Only) Modified Rankin (Stroke Patients Only) Pre-Morbid Rankin Score: Slight disability Modified Rankin: Moderately severe disability     Balance Overall balance assessment: Needs assistance   Sitting balance-Leahy Scale: Fair Sitting balance - Comments: UE support in sitting due to pain   Standing balance support: Bilateral upper extremity supported Standing balance-Leahy Scale: Poor                              Cognition Arousal/Alertness: Awake/alert Behavior During Therapy: WFL for tasks assessed/performed Overall Cognitive Status: Impaired/Different from baseline Area of Impairment: Safety/judgement;Problem solving                   Current Attention Level: Selective   Following Commands: Follows one step commands consistently;Follows one step commands with increased time     Problem Solving: Slow processing  Exercises      General Comments        Pertinent Vitals/Pain Pain Assessment: Faces Faces Pain Scale: Hurts whole  lot Pain Location: Lower back with side to sit Pain Descriptors / Indicators: Grimacing;Discomfort;Guarding Pain Intervention(s): Monitored during session;Repositioned    Home Living                      Prior Function            PT Goals (current goals can now be found in the care plan section) Progress towards PT goals: Progressing toward goals    Frequency    Min 4X/week      PT Plan Current plan remains appropriate    Co-evaluation              AM-PAC PT "6 Clicks" Mobility   Outcome Measure  Help needed turning from your back to your side while in a flat bed without using bedrails?: A Little Help needed moving from lying on your back to sitting on the side of a flat bed without using bedrails?: A Lot Help needed moving to and from a bed to a chair (including a wheelchair)?: A Lot Help needed standing up from a chair using your arms (e.g., wheelchair or bedside chair)?: A Lot Help needed to walk in hospital room?: A Little Help needed climbing 3-5 steps with a railing? : A Lot 6 Click Score: 14    End of Session Equipment Utilized During Treatment: Back brace Activity Tolerance: Patient tolerated treatment well Patient left: in chair;with call bell/phone within reach;with chair alarm set   PT Visit Diagnosis: Muscle weakness (generalized) (M62.81);Difficulty in walking, not elsewhere classified (R26.2);Other symptoms and signs involving the nervous system (R29.898);Hemiplegia and hemiparesis;Pain Hemiplegia - dominant/non-dominant: Dominant Hemiplegia - caused by: Cerebral infarction     Time: 5597-4163 PT Time Calculation (min) (ACUTE ONLY): 22 min  Charges:  $Gait Training: 8-22 mins                     Keith Dixon, PT Acute Rehabilitation Services Pager:[phone] [phone] 01/30/2021    Reginia Naas 01/30/2021, 5:40 PM

## 2021-01-30 NOTE — TOC Progression Note (Signed)
Transition of Care [facility]) - Progression Note    Patient Details  Name: Keith Dixon MRN: [id number] Date of Birth: [birth year]  Transition of Care [facility]) CM/SW [place], [place] Phone Number: 01/30/2021, 1:33 PM  Clinical Narrative:     CSW met with bed at bedside. CSW introduced self and explained reason for visit. CSW confirmed with patient he remains agreeable to [facility] for short term rehab. CSW explained he spiked a fever(yesterday afternoon) and has to be fever free for at least 24-48 hrs with no fever reducing medications. Patient states understanding.  Charge RN reports no fever this am.  CSW informed [place] anticipate d/c tomorrow CSW udpated RN & requested covid test   CSW will continue to follow and assist with discharge planning.  Thurmond Butts, MSW, LCSW Clinical Social Worker    Expected Discharge Plan: Skilled Nursing Facility Barriers to Discharge: SNF Pending bed offer  Expected Discharge Plan and Services Expected Discharge Plan: [place] In-house Referral: Clinical Social Work     Living arrangements for the past 2 months: Single Family Home                                       Social Determinants of Health (SDOH) Interventions    Readmission Risk Interventions No flowsheet data found.

## 2021-01-30 NOTE — Progress Notes (Signed)
Subjective: NAEs o/n.  Objective: Vital signs in last 24 hours: Temp:  [97.4 F (36.3 C)-100.7 F (38.2 C)] 97.4 F (36.3 C) (09/21 0807) Pulse Rate:  [71-95] 92 (09/21 0807) Resp:  [14-16] 16 (09/21 0807) BP: (143-163)/(83-104) 157/92 (09/21 0807) SpO2:  [94 %-100 %] 99 % (09/21 0807)  Intake/Output from previous day: 09/20 0701 - 09/21 0700 In: 1280 [P.O.:1280] Out: 1070 [Urine:1050; Drains:20] Intake/Output this shift: No intake/output data recorded.  Incision c/d.  Area of fibrinous debris at superior aspect of incision.  No drainage, nontender 4+/5 strength in LEs  Lab Results: Recent Labs    01/30/21 0429  WBC 7.3  HGB 10.6*  HCT 33.0*  PLT 339   BMET Recent Labs    01/30/21 0429  NA 132*  K 4.7  CL 97*  CO2 23  GLUCOSE 112*  BUN 24*  CREATININE 1.28*  CALCIUM 10.3    Studies/Results: No results found.  Assessment/Plan: S/p T10-pelvis reconstruction - D/C staples today - plan for d/c today - return to clinic in 2 weeks for wound check   Keith Dixon 01/30/2021, 9:27 AM

## 2021-01-31 MED ORDER — FOLIC ACID 1 MG PO TABS: 1.0000 mg | ORAL_TABLET | Freq: Every day | ORAL | 0 refills | Status: DC

## 2021-01-31 MED ORDER — CYCLOBENZAPRINE HCL 10 MG PO TABS: 10.0000 mg | ORAL_TABLET | Freq: Three times a day (TID) | ORAL | 0 refills | Status: AC | PRN

## 2021-01-31 MED ORDER — ZINC SULFATE 220 (50 ZN) MG PO CAPS: 220.0000 mg | ORAL_CAPSULE | Freq: Every day | ORAL | 0 refills | Status: DC

## 2021-01-31 MED ORDER — OXYCODONE HCL 10 MG PO TABS: 10.0000 mg | ORAL_TABLET | ORAL | 0 refills | Status: DC | PRN

## 2021-01-31 MED ORDER — ASPIRIN 81 MG PO CHEW: 81.0000 mg | CHEWABLE_TABLET | Freq: Every day | ORAL | 3 refills | Status: AC

## 2021-01-31 MED ORDER — DOCUSATE SODIUM 100 MG PO CAPS: 100.0000 mg | ORAL_CAPSULE | Freq: Two times a day (BID) | ORAL | 2 refills | Status: AC | PRN

## 2021-01-31 MED ORDER — ENOXAPARIN SODIUM 40 MG/0.4ML IJ SOSY: 40.0000 mg | PREFILLED_SYRINGE | INTRAMUSCULAR | 0 refills | Status: DC

## 2021-01-31 MED ORDER — AMLODIPINE BESYLATE 2.5 MG PO TABS: 2.5000 mg | ORAL_TABLET | Freq: Every day | ORAL | 2 refills | Status: DC

## 2021-01-31 MED ORDER — BACITRACIN ZINC 500 UNIT/GM EX OINT: TOPICAL_OINTMENT | Freq: Every day | CUTANEOUS | 0 refills | Status: DC

## 2021-01-31 MED ORDER — SULFAMETHOXAZOLE-TRIMETHOPRIM 800-160 MG PO TABS: 1.0000 | ORAL_TABLET | Freq: Two times a day (BID) | ORAL | 0 refills | Status: DC

## 2021-01-31 MED ORDER — CYANOCOBALAMIN 1000 MCG PO TABS: 1000.0000 ug | ORAL_TABLET | Freq: Every day | ORAL | 0 refills | Status: DC

## 2021-01-31 MED ORDER — ENSURE ENLIVE PO LIQD: 1.0000 | Freq: Three times a day (TID) | ORAL | 0 refills | Status: DC

## 2021-01-31 MED ORDER — PANTOPRAZOLE SODIUM 40 MG PO TBEC: 40.0000 mg | DELAYED_RELEASE_TABLET | Freq: Every day | ORAL | 2 refills | Status: AC

## 2021-01-31 NOTE — TOC Transition Note (Signed)
Transition of Care [facility]) - CM/SW Discharge Note   Patient Details  Name: TRAN ARZUAGA MRN: [id number] Date of Birth: 1961/03/24  Transition of Care [facility]) CM/SW Contact:  Vinie Sill, LCSW Phone Number: 01/31/2021, 1:56 PM   Clinical Narrative:     Patient will Discharge to: [facility]  Discharge Date:01/31/2021 Family Notified: left voice message w/significant other [place] Transport [id number]  Per MD patient is ready for discharge. RN, patient, and facility notified of discharge. Discharge Summary sent to facility. RN given number for [phone]. Ambulance transport requested for patient.   Clinical Social Worker signing off.  Thurmond Butts, MSW, LCSW Clinical Social Worker      Final next level of care: Skilled Nursing Facility Barriers to Discharge: Barriers Resolved   Patient Goals and CMS Choice        Discharge Placement              Patient chooses bed at: [facility] Patient to be transferred to facility by: [place] Name of family member notified: left voice message for [place] Patient and family notified of of transfer: 01/31/21  Discharge Plan and Services In-house Referral: Clinical Social Work                                   Social Determinants of Health (SDOH) Interventions     Readmission Risk Interventions No flowsheet data found.

## 2021-01-31 NOTE — Progress Notes (Signed)
Physical Therapy Treatment Patient Details Name: Keith Dixon MRN: [id number] DOB: 1961/04/11 Today's Date: 01/31/2021   History of Present Illness Pt is a 60 y.o. male admitted 12/12/20 with back and BLE pain. S/p DLIF L1-L5 and T10-pelvis decompression and fusion 8/3; second stage of sx performed 8/4. Found to have RLE DVT, L IJ DVT; s/p IVC filter placement 8/6. Head CT 8/7 with bilateral cortical, L thalamic, bilateral cerebellar infarcts; suggestive of embolic disease. Brain MRI 8/13 with multiple acute/subacute infarcts in cerebellum,  thalami (L>R), bilateral cerebral hemispheres. Course complicated by AKI. ETT 8/4-8/15; reintubated 8/15. Trach placed 8/16. Underwent thoracolumbar wound exploration and application of wound vac on 9/8. PMH includes PTSD, HTN, OSA, bipolar, seizures, degenerative scoliosis, tobacco use.    PT Comments    Continuing work on functional mobility and activity tolerance;  Pt motivated to imporve, and seems happy to walk in the hallways and get out of his room; Goals met and updated; pt particiapted well in goal setting;   Overall progressing well at this time; Anticipate continuing good progress at post-acute rehabilitation.   Recommendations for follow up therapy are one component of a multi-disciplinary discharge planning process, led by the attending physician.  Recommendations may be updated based on patient status, additional functional criteria and insurance authorization.  Follow Up Recommendations  SNF;Supervision for mobility/OOB     Equipment Recommendations  Wheelchair (measurements PT);Wheelchair cushion (measurements PT);Rolling walker with 5" wheels;3in1 (PT);Hospital bed    Recommendations for Other Services       Precautions / Restrictions Precautions Precautions: Fall;Back Precaution Comments: spinal precautions, JP drain Required Braces or Orthoses: Spinal Brace Spinal Brace: Thoracolumbosacral orthotic;Applied in sitting  position Spinal Brace Comments: clamshell     Mobility  Bed Mobility Overal bed mobility: Needs Assistance Bed Mobility: Rolling;Sidelying to Sit Rolling: Min guard Sidelying to sit: Min assist;HOB elevated (almost mod assist)       General bed mobility comments: Heavy cues and close monotr for log roll; also cues to fully roll into sidelying ("stack hips and shoulders") before intiating sitting up    Transfers Overall transfer level: Needs assistance Equipment used: Rolling walker (2 wheeled) Transfers: Sit to/from Stand Sit to Stand: From elevated surface;Min assist;Mod assist         General transfer comment: Mod assist with rising from RW and controlling descent to sit to low recliner seat  Ambulation/Gait Ambulation/Gait assistance: Min assist;Min guard Gait Distance (Feet): 75 Feet (x2) Assistive device: Rolling walker (2 wheeled) Gait Pattern/deviations: Step-through pattern;Step-to pattern;Decreased stride length;Antalgic;Narrow base of support     General Gait Details: heavy UE reliance, but made it all the way around unit; chair close in case   Stairs             Wheelchair Mobility    Modified Rankin (Stroke Patients Only) Modified Rankin (Stroke Patients Only) Pre-Morbid Rankin Score: Slight disability Modified Rankin: Moderately severe disability     Balance     Sitting balance-Leahy Scale: Fair       Standing balance-Leahy Scale: Poor                              Cognition Arousal/Alertness: Awake/alert Behavior During Therapy: WFL for tasks assessed/performed Overall Cognitive Status: Impaired/Different from baseline Area of Impairment: Safety/judgement;Problem solving                   Current Attention Level: Selective   Following Commands: Follows one  step commands consistently       General Comments: Noted less carryover of work on widening BOS with amb this session      Exercises      General  Comments General comments (skin integrity, edema, etc.): Repeated walk out into lobby to get some sunshine and enjoy the floor to ceiling views      Pertinent Vitals/Pain Pain Assessment: 0-10 Pain Score: 4  Pain Location: Lower back with side to sit; reports pain improved with amb Pain Descriptors / Indicators: Grimacing;Discomfort;Guarding Pain Intervention(s): Monitored during session    Home Living                      Prior Function            PT Goals (current goals can now be found in the care plan section) Acute Rehab PT Goals Patient Stated Goal: to move better PT Goal Formulation: With patient Time For Goal Achievement: 02/14/21 Potential to Achieve Goals: Good Progress towards PT goals: Goals met and updated - see care plan    Frequency    Min 4X/week      PT Plan Current plan remains appropriate    Co-evaluation              AM-PAC PT "6 Clicks" Mobility   Outcome Measure  Help needed turning from your back to your side while in a flat bed without using bedrails?: A Little Help needed moving from lying on your back to sitting on the side of a flat bed without using bedrails?: A Lot Help needed moving to and from a bed to a chair (including a wheelchair)?: A Little Help needed standing up from a chair using your arms (e.g., wheelchair or bedside chair)?: A Lot Help needed to walk in hospital room?: A Little Help needed climbing 3-5 steps with a railing? : A Lot 6 Click Score: 15    End of Session Equipment Utilized During Treatment: Back brace Activity Tolerance: Patient tolerated treatment well Patient left: in chair;with call bell/phone within reach;with chair alarm set Nurse Communication: Mobility status PT Visit Diagnosis: Muscle weakness (generalized) (M62.81);Difficulty in walking, not elsewhere classified (R26.2);Other symptoms and signs involving the nervous system (R29.898);Hemiplegia and hemiparesis;Pain Hemiplegia -  Right/Left: Right Hemiplegia - dominant/non-dominant: Dominant Hemiplegia - caused by: Cerebral infarction Pain - Right/Left:  (incisional) Pain - part of body:  (back)     Time: 1020-1100 PT Time Calculation (min) (ACUTE ONLY): 40 min  Charges:  $Gait Training: 23-37 mins $Therapeutic Activity: 8-22 mins                     Roney Marion, PT  Acute Rehabilitation Services Pager [phone] Office [place] 01/31/2021, 12:41 PM

## 2021-01-31 NOTE — Discharge Summary (Signed)
Physician Discharge Summary  Patient ID: Keith Dixon MRN: [id number] DOB/AGE: Sep 25, 1960 60 y.o.  Admit date: 12/12/2020 Discharge date: 01/31/2021  Admission Diagnoses:  Severe lumbar stenosis and deformity  Discharge Diagnoses:  Same Active Problems:   Lumbar spine scoliosis   Spinal stenosis of lumbar region   Arterial line in place   Encounter for central line placement   Cerebral thrombosis with cerebral infarction   Altered mental status   Respiratory failure ([place])   Tracheostomy in place [facility])   History of tracheostomy   Discharged Condition: Stable  Hospital Course:  Keith Dixon is a 60 y.o. male with hx of seizures, PTSD, bipolar disorder who presented to the clinic for severe progressive back pain and bilateral leg pain that had progressed to the point he was nonambulatory.  He was found to have severe lumbar stenosis and cauda equina compression with severe sagittal deformity.  He underwent stage 1 L1-5 DLIFs without complication.  On POD#1, he underwent stage 2 T10-pelvis reconstruction with decompression including numerous osteotomies, L5-S1 TLIF.  Postoperatively he was left intubated and brought to the ICU in stable condition and the critical care team was consulted.  However, overnight, he developed poor urine output and decreasing blood pressures.  Despite resuscitation, his kidney function continued to deteriorate and he developed need for pressor support.  His oliguria worsened to anuria and he required nephrology consult, dialysis catheter placement and CRRT.  He developed right leg swelling and a DVT scan confirmed a large DVT.  A IVC filter was placed, his drains were removed and heparin drip was started on postop day #5.  He required transfusions for anemia.  He developed serosanguineous drainage from his wound which was initially managed with a Prevena dressing given his medical instability.  He was also noted to be slow with following commands and had  persistence encephalopathy so head imaging including MRI was performed which showed small subcortical strokes for which she was started on aspirin.  Eventually, when his cardiovascular function normalized, he was switched to dialysis and his kidney function began to normalize.  An extubation trial was performed but patient required reintubation.  Given his persistent respiratory failure, a tracheostomy was performed.  However several days later he decannulated himself and fortunately was doing well without his tracheostomy.  He was downgraded to the progressive care unit where his Prevena dressing was removed and continue daily dressings were performed.  He was switched to Eliquis.  He worked with physical therapy and was mobilized out of bed with a clamshell.  Rehab was recommended, but due to difficulty with ultimate disposition, family elected to proceed with skilled nursing facility placement.  Patient had persistent serosanguineous drainage from his wound and although much of the skin that healed well, there was pinpoint areas of poor wound healing.  Fortunately, he did not develop any fevers, or purulent drainage.  A CT was performed and showed a subfascial seroma versus hematoma.  The patient was taken to the operating room for a wound revision.  Subacute to chronic subfascial hematoma was removed and fascial dehiscence was repaired.  Subfascial and subcutaneous drains were placed and another Prevena dressing was placed on the incision.  His repeat DVT scan showed his DVT had resolved and he had no clinical evidence of PE so to aid with his wound healing, his Eliquis was switched to prophylactic Lovenox.  The drains were removed 12 days postsurgery after they demonstrated minimal output.  His staples were removed 2 weeks after  surgery and his wound appeared to be healing well with no drainage, tenderness or signs of infection.  He was placed on Bactrim for 6 weeks for wound prophylaxis.  He had lab checks  every 3 days which showed no significant leukocytosis, anemia, or metabolic derangements.  He was deemed ready for discharge to SNF.   Treatments: See discharge summary  Discharge Exam: Blood pressure (!) 148/96, pulse 98, temperature 98.2 F (36.8 C), resp. rate 18, height 6' (1.829 m), weight 95.8 kg, SpO2 98 %. Awake, alert, oriented Speech fluent, appropriate CN grossly intact 4+/5 BUE/BLE due to deconditioning Flank wound well-healed.  Back wound appears to be healing well.  There is small area of yellow-whitish fibrinous scar at the superior aspect of the incision which has shown steady improvement over the last several weeks.  Disposition: Discharge disposition: 03-Skilled Nursing Facility       Discharge Instructions     Incentive spirometry RT   Complete by: As directed    Incentive spirometry RT   Complete by: As directed    Incentive spirometry RT   Complete by: As directed       Allergies as of 01/31/2021   No Known Allergies      Medication List     STOP taking these medications    baclofen 10 MG tablet Commonly known as: LIORESAL   meloxicam 15 MG tablet Commonly known as: MOBIC       TAKE these medications    acetaminophen 500 MG tablet Commonly known as: TYLENOL Take 2 tablets (1,000 mg total) by mouth every 6 (six) hours as needed for mild pain.   amLODipine 2.5 MG tablet Commonly known as: NORVASC Take 1 tablet (2.5 mg total) by mouth daily. Start taking on: February 01, 2021 What changed:  medication strength how much to take when to take this   aspirin 81 MG chewable tablet Chew 1 tablet (81 mg total) by mouth daily. Start taking on: February 01, 2021   bacitracin ointment Apply topically daily. Start taking on: February 01, 2021   cyanocobalamin 1000 MCG tablet Take 1 tablet (1,000 mcg total) by mouth daily. Start taking on: February 01, 2021   cyclobenzaprine 10 MG tablet Commonly known as: FLEXERIL Take 1 tablet  (10 mg total) by mouth 3 (three) times daily as needed for muscle spasms.   docusate sodium 100 MG capsule Commonly known as: COLACE Take 1 capsule (100 mg total) by mouth 2 (two) times daily as needed for mild constipation.   enoxaparin 40 MG/0.4ML injection Commonly known as: LOVENOX Inject 0.4 mLs (40 mg total) into the skin daily for 28 days. Start taking on: February 01, 2021   feeding supplement Liqd Take 237 mLs by mouth 3 (three) times daily between meals.   folic acid 1 MG tablet Commonly known as: FOLVITE Take 1 tablet (1 mg total) by mouth daily. Start taking on: February 01, 2021   gabapentin 300 MG capsule Commonly known as: NEURONTIN Take 900 mg by mouth 2 (two) times daily.   hydrOXYzine 50 MG capsule Commonly known as: VISTARIL Take 50 mg by mouth in the morning and at bedtime.   lamoTRIgine 100 MG tablet Commonly known as: LAMICTAL Take 1 tablet (100 mg total) by mouth 2 (two) times daily.   levETIRAcetam 750 MG tablet Commonly known as: KEPPRA Take 1,500 mg by mouth 2 (two) times daily.   lisinopril-hydrochlorothiazide 20-12.5 MG tablet Commonly known as: ZESTORETIC Take 1 tablet by mouth in the morning.  Oxycodone HCl 10 MG Tabs Take 1 tablet (10 mg total) by mouth every 4 (four) hours as needed for moderate pain ((score 4 to 6)).   pantoprazole 40 MG tablet Commonly known as: PROTONIX Take 1 tablet (40 mg total) by mouth at bedtime.   QUEtiapine 200 MG tablet Commonly known as: SEROQUEL Take 200 mg by mouth at bedtime.   sertraline 100 MG tablet Commonly known as: ZOLOFT Take 100 mg by mouth daily after breakfast.   sulfamethoxazole-trimethoprim 800-160 MG tablet Commonly known as: BACTRIM DS Take 1 tablet by mouth 2 (two) times daily for 28 days.   Vitamin D3 50 MCG (2000 UT) Tabs Take 2,000 Units by mouth in the morning and at bedtime.   zinc sulfate 220 (50 Zn) MG capsule Take 1 capsule (220 mg total) by mouth daily. Start  taking on: February 01, 2021        Follow-up Information     Vallarie Mare, MD Follow up in 2 week(s).   Specialty: Neurosurgery Contact information: [address] [place] [address] [phone]                 Signed: Vallarie Mare 01/31/2021, 1:22 PM

## 2021-01-31 NOTE — Progress Notes (Signed)
Subjective: Patient reports pain well-controlled  Objective: Vital signs in last 24 hours: Temp:  [97.9 F (36.6 C)-98.2 F (36.8 C)] 98.1 F (36.7 C) (09/22 0839) Pulse Rate:  [80-93] 93 (09/22 0839) Resp:  [18-20] 18 (09/22 0839) BP: (106-156)/(49-99) 154/92 (09/22 0839) SpO2:  [96 %-100 %] 100 % (09/22 0839)  Intake/Output from previous day: 09/21 0701 - 09/22 0700 In: 120 [P.O.:120] Out: 550 [Urine:550] Intake/Output this shift: No intake/output data recorded.  NAD Incision appears to be healing well, no drainage or erythema 5/5 strength in [place] Remains afebrile   Lab Results: Recent Labs    01/30/21 0429  WBC 7.3  HGB 10.6*  HCT 33.0*  PLT 339   BMET Recent Labs    01/30/21 0429  NA 132*  K 4.7  CL 97*  CO2 23  GLUCOSE 112*  BUN 24*  CREATININE 1.28*  CALCIUM 10.3    Studies/Results: No results found.  Assessment/Plan: S/p T10-pelvis reconstruction - likely SNF discharge today  Keith Dixon 01/31/2021, 10:00 AM

## 2021-02-07 ENCOUNTER — Emergency Department (HOSPITAL_COMMUNITY): Payer: No Typology Code available for payment source

## 2021-02-07 ENCOUNTER — Inpatient Hospital Stay (HOSPITAL_COMMUNITY)
Admission: EM | Admit: 2021-02-07 | Discharge: 2021-02-11 | DRG: 684 | Disposition: A | Discharge status: Skilled Nursing Facility | Payer: No Typology Code available for payment source | Source: Skilled Nursing Facility | Attending: Internal Medicine | Admitting: Internal Medicine

## 2021-02-07 ENCOUNTER — Encounter (HOSPITAL_COMMUNITY): Payer: Self-pay | Admitting: Emergency Medicine

## 2021-02-07 DIAGNOSIS — Z720 Tobacco use: Secondary | ICD-10-CM

## 2021-02-07 DIAGNOSIS — F319 Bipolar disorder, unspecified: Secondary | ICD-10-CM | POA: Diagnosis present

## 2021-02-07 DIAGNOSIS — I10 Essential (primary) hypertension: Secondary | ICD-10-CM | POA: Diagnosis present

## 2021-02-07 DIAGNOSIS — Z20822 Contact with and (suspected) exposure to covid-19: Secondary | ICD-10-CM | POA: Diagnosis present

## 2021-02-07 DIAGNOSIS — K219 Gastro-esophageal reflux disease without esophagitis: Secondary | ICD-10-CM | POA: Diagnosis present

## 2021-02-07 DIAGNOSIS — Z23 Encounter for immunization: Secondary | ICD-10-CM | POA: Diagnosis not present

## 2021-02-07 DIAGNOSIS — R531 Weakness: Secondary | ICD-10-CM

## 2021-02-07 DIAGNOSIS — Z86718 Personal history of other venous thrombosis and embolism: Secondary | ICD-10-CM | POA: Diagnosis not present

## 2021-02-07 DIAGNOSIS — F17210 Nicotine dependence, cigarettes, uncomplicated: Secondary | ICD-10-CM | POA: Diagnosis present

## 2021-02-07 DIAGNOSIS — D649 Anemia, unspecified: Secondary | ICD-10-CM | POA: Diagnosis present

## 2021-02-07 DIAGNOSIS — G40909 Epilepsy, unspecified, not intractable, without status epilepticus: Secondary | ICD-10-CM | POA: Diagnosis present

## 2021-02-07 DIAGNOSIS — Z8673 Personal history of transient ischemic attack (TIA), and cerebral infarction without residual deficits: Secondary | ICD-10-CM

## 2021-02-07 DIAGNOSIS — M1990 Unspecified osteoarthritis, unspecified site: Secondary | ICD-10-CM | POA: Diagnosis present

## 2021-02-07 DIAGNOSIS — Z8249 Family history of ischemic heart disease and other diseases of the circulatory system: Secondary | ICD-10-CM | POA: Diagnosis not present

## 2021-02-07 DIAGNOSIS — Z79899 Other long term (current) drug therapy: Secondary | ICD-10-CM | POA: Diagnosis not present

## 2021-02-07 DIAGNOSIS — N170 Acute kidney failure with tubular necrosis: Secondary | ICD-10-CM | POA: Diagnosis present

## 2021-02-07 DIAGNOSIS — E861 Hypovolemia: Secondary | ICD-10-CM | POA: Diagnosis present

## 2021-02-07 DIAGNOSIS — I959 Hypotension, unspecified: Secondary | ICD-10-CM | POA: Diagnosis present

## 2021-02-07 DIAGNOSIS — E875 Hyperkalemia: Secondary | ICD-10-CM | POA: Diagnosis present

## 2021-02-07 DIAGNOSIS — R569 Unspecified convulsions: Secondary | ICD-10-CM

## 2021-02-07 DIAGNOSIS — I454 Nonspecific intraventricular block: Secondary | ICD-10-CM | POA: Diagnosis present

## 2021-02-07 DIAGNOSIS — T368X5A Adverse effect of other systemic antibiotics, initial encounter: Secondary | ICD-10-CM | POA: Diagnosis present

## 2021-02-07 DIAGNOSIS — Z803 Family history of malignant neoplasm of breast: Secondary | ICD-10-CM | POA: Diagnosis not present

## 2021-02-07 DIAGNOSIS — Z981 Arthrodesis status: Secondary | ICD-10-CM

## 2021-02-07 DIAGNOSIS — R5383 Other fatigue: Secondary | ICD-10-CM

## 2021-02-07 DIAGNOSIS — Z7982 Long term (current) use of aspirin: Secondary | ICD-10-CM

## 2021-02-07 DIAGNOSIS — E8809 Other disorders of plasma-protein metabolism, not elsewhere classified: Secondary | ICD-10-CM | POA: Diagnosis present

## 2021-02-07 DIAGNOSIS — N179 Acute kidney failure, unspecified: Secondary | ICD-10-CM | POA: Diagnosis present

## 2021-02-07 DIAGNOSIS — E860 Dehydration: Secondary | ICD-10-CM | POA: Diagnosis present

## 2021-02-07 DIAGNOSIS — Z4800 Encounter for change or removal of nonsurgical wound dressing: Secondary | ICD-10-CM

## 2021-02-07 LAB — COMPREHENSIVE METABOLIC PANEL
ALT: 12 U/L (ref 0–44)
AST: 13 U/L — ABNORMAL LOW (ref 15–41)
Albumin: 2.7 g/dL — ABNORMAL LOW (ref 3.5–5.0)
Alkaline Phosphatase: 89 U/L (ref 38–126)
Anion gap: 14 (ref 5–15)
BUN: 105 mg/dL — ABNORMAL HIGH (ref 6–20)
CO2: 16 mmol/L — ABNORMAL LOW (ref 22–32)
Calcium: 9.6 mg/dL (ref 8.9–10.3)
Chloride: 96 mmol/L — ABNORMAL LOW (ref 98–111)
Creatinine, Ser: 6.78 mg/dL — ABNORMAL HIGH (ref 0.61–1.24)
GFR, Estimated: 9 mL/min — ABNORMAL LOW (ref >60)
Glucose, Bld: 108 mg/dL — ABNORMAL HIGH (ref 70–99)
Potassium: 5.9 mmol/L — ABNORMAL HIGH (ref 3.5–5.1)
Sodium: 126 mmol/L — ABNORMAL LOW (ref 135–145)
Total Bilirubin: 0.3 mg/dL (ref 0.3–1.2)
Total Protein: 6.9 g/dL (ref 6.5–8.1)

## 2021-02-07 LAB — URINALYSIS, ROUTINE W REFLEX MICROSCOPIC
Bilirubin Urine: NEGATIVE
Glucose, UA: NEGATIVE mg/dL
Hgb urine dipstick: NEGATIVE
Ketones, ur: NEGATIVE mg/dL
Leukocytes,Ua: NEGATIVE
Nitrite: NEGATIVE
Protein, ur: NEGATIVE mg/dL
Specific Gravity, Urine: 1.011 (ref 1.005–1.030)
pH: 5 (ref 5.0–8.0)

## 2021-02-07 LAB — CBC WITH DIFFERENTIAL/PLATELET
Abs Immature Granulocytes: 0.03 10*3/uL (ref 0.00–0.07)
Basophils Absolute: 0 10*3/uL (ref 0.0–0.1)
Basophils Relative: 0 %
Eosinophils Absolute: 0.1 10*3/uL (ref 0.0–0.5)
Eosinophils Relative: 2 %
HCT: 27.5 % — ABNORMAL LOW (ref 39.0–52.0)
Hemoglobin: 8.8 g/dL — ABNORMAL LOW (ref 13.0–17.0)
Immature Granulocytes: 0 %
Lymphocytes Relative: 13 %
Lymphs Abs: 0.9 10*3/uL (ref 0.7–4.0)
MCH: 26.2 pg (ref 26.0–34.0)
MCHC: 32 g/dL (ref 30.0–36.0)
MCV: 81.8 fL (ref 80.0–100.0)
Monocytes Absolute: 0.4 10*3/uL (ref 0.1–1.0)
Monocytes Relative: 6 %
Neutro Abs: 5.5 10*3/uL (ref 1.7–7.7)
Neutrophils Relative %: 79 %
Platelets: 478 10*3/uL — ABNORMAL HIGH (ref 150–400)
RBC: 3.36 MIL/uL — ABNORMAL LOW (ref 4.22–5.81)
RDW: 16.4 % — ABNORMAL HIGH (ref 11.5–15.5)
WBC: 6.9 10*3/uL (ref 4.0–10.5)
nRBC: 0 % (ref 0.0–0.2)

## 2021-02-07 LAB — RESP PANEL BY RT-PCR (FLU A&B, COVID) ARPGX2
Influenza A by PCR: NEGATIVE
Influenza B by PCR: NEGATIVE
SARS Coronavirus 2 by RT PCR: NEGATIVE

## 2021-02-07 MED ORDER — ACETAMINOPHEN 650 MG RE SUPP: 650.0000 mg | Freq: Four times a day (QID) | RECTAL | Status: DC | PRN

## 2021-02-07 MED ORDER — SODIUM CHLORIDE 0.9 % IV BOLUS
1000.0000 mL | Freq: Once | INTRAVENOUS | Status: AC
  Administered 2021-02-07: 1000 mL via INTRAVENOUS

## 2021-02-07 MED ORDER — QUETIAPINE FUMARATE 100 MG PO TABS
200.0000 mg | ORAL_TABLET | Freq: Every day | ORAL | Status: DC
  Administered 2021-02-08 – 2021-02-11 (×4): 200 mg via ORAL
  Filled 2021-02-07: qty 2
  Filled 2021-02-07: qty 1
  Filled 2021-02-07 (×3): qty 2

## 2021-02-07 MED ORDER — LAMOTRIGINE 100 MG PO TABS
100.0000 mg | ORAL_TABLET | Freq: Two times a day (BID) | ORAL | Status: DC
  Administered 2021-02-07 – 2021-02-11 (×9): 100 mg via ORAL
  Filled 2021-02-07 (×10): qty 1

## 2021-02-07 MED ORDER — FOLIC ACID 1 MG PO TABS
1.0000 mg | ORAL_TABLET | Freq: Every day | ORAL | Status: DC
  Administered 2021-02-08 – 2021-02-11 (×4): 1 mg via ORAL
  Filled 2021-02-07 (×4): qty 1

## 2021-02-07 MED ORDER — LEVETIRACETAM 750 MG PO TABS
1500.0000 mg | ORAL_TABLET | Freq: Two times a day (BID) | ORAL | Status: DC
  Administered 2021-02-07 – 2021-02-11 (×9): 1500 mg via ORAL
  Filled 2021-02-07 (×4): qty 2
  Filled 2021-02-07: qty 3
  Filled 2021-02-07 (×3): qty 2
  Filled 2021-02-07: qty 3

## 2021-02-07 MED ORDER — LACTATED RINGERS IV SOLN: INTRAVENOUS | Status: AC

## 2021-02-07 MED ORDER — ASPIRIN 81 MG PO CHEW
81.0000 mg | CHEWABLE_TABLET | Freq: Every day | ORAL | Status: DC
  Administered 2021-02-08 – 2021-02-11 (×4): 81 mg via ORAL
  Filled 2021-02-07 (×4): qty 1

## 2021-02-07 MED ORDER — ACETAMINOPHEN 325 MG PO TABS: 650.0000 mg | ORAL_TABLET | Freq: Four times a day (QID) | ORAL | Status: DC | PRN

## 2021-02-07 MED ORDER — PANTOPRAZOLE SODIUM 40 MG PO TBEC
40.0000 mg | DELAYED_RELEASE_TABLET | Freq: Every day | ORAL | Status: DC
  Administered 2021-02-08 – 2021-02-11 (×4): 40 mg via ORAL
  Filled 2021-02-07 (×4): qty 1
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
## 2021-02-07 NOTE — ED Notes (Signed)
Pt cardiac monitor alarming about ST changes and elevation/depression. Repeat EKG done and given to Dr. Alvino Chapel. Pt denies CP or SOB, resting comfortably on stretcher. No acute changes noted. Will continue to monitor.

## 2021-02-07 NOTE — ED Provider Notes (Signed)
Emergency Medicine Provider Triage Evaluation Note  Keith Dixon , a 60 y.o. male  was evaluated in triage.  Pt presents to the emergency department for abnormal labs.  Had labs drawn yesterday which showed a creatinine of 6 and a potassium of 6.  Overall, he states he feels well.  He denies any chest pain, shortness of breath, weakness/numbness in upper and lower extremity, urinary complaints, nausea, vomiting, diarrhea.  Of note, he was recently discharged from the hospital secondary to back surgery secondary to severe lumbar stenosis  Review of Systems  Positive:  Negative: See above   Physical Exam  BP 111/61 (BP Location: Left Arm)   Pulse 79   Temp 98.6 F (37 C) (Oral)   Resp 18   SpO2 100%  Gen:   Awake, no distress   Resp:  Normal effort  MSK:   Moves extremities without difficulty  Other:    Medical Decision Making  Medically screening exam initiated at 3:27 PM.  Appropriate orders placed.  RICE WALSH was informed that the remainder of the evaluation will be completed by another provider, this initial triage assessment does not replace that evaluation, and the importance of remaining in the ED until their evaluation is complete.     Myna Bright Vian, PA-C 02/07/21 1529    Gareth Morgan, MD 02/09/21 0800

## 2021-02-07 NOTE — ED Notes (Signed)
Patient transported to imaging.

## 2021-02-07 NOTE — H&P (Signed)
History and Physical    PLEASE NOTE THAT DRAGON DICTATION SOFTWARE WAS USED IN THE CONSTRUCTION OF THIS NOTE.   Keith Dixon ZDG:[id number] DOB: 03/14/1961 DOA: 02/07/2021  PCP: Reubin Milan, MD Patient coming from: home   I have personally briefly reviewed patient's old medical records in [place]  Chief Complaint: Worsening renal function  HPI: Keith Dixon is a 60 y.o. male with medical history significant for chronic anemia associated baseline hemoglobin 9-12, seizure disorder, GERD, who is admitted to [facility] on 02/07/2021 with acute renal failure after presenting from home to [facility] ED for further evaluation of outpatient labs reflecting worsening renal function.   The patient was [facility] to 01/31/2021 with severe lumbar stenosis, for which he initially underwent anterior lateral lumbar fusion on 12/12/2020 via Dr. Marcello Moores of neurosurgery followed by posterior lumbar fusion on 12/13/2020.  Ensuing hospital course was complicated by a minute of acute renal failure for which the patient transiently required hemodialysis.  He also reportedly developed wound associated with the aforementioned lumbar surgical site, prompting thoracolumbar wound exploration on 01/17/2021.  Over the course of the remainder of the hospitalization, his renal function improved back to baseline, with final serum creatinine noted to be 1.28 on 01/30/2021.  In the setting of the aforementioned lumbar wound, the patient was started on a 4-week course of Bactrim, with first dose occurring on 01/21/2021, with 4-week course to terminate on 02/17/2021.  Additionally, he was started on a 4-week course of prophylactic Lovenox 40 mg subcu daily, with initial dose starting on 01/18/2021 and final dose scheduled to occur on 02/14/2021.   Earlier today, he underwent routine outpatient follow-up labs, reportedly reflecting interval worsening in renal function, with creatinine greater  than 6, with ensuing recommendation to present to [facility] emergency department for further evaluation management thereof.  The patient reports generalized weakness over the last 3 to 4 days in the absence of any associated acute focal weakness, acute focal numbness, paresthesias, facial droop, slurred speech, expressive aphasia, acute change in vision, dysphagia, vertigo.  He also notes recent decline in appetite resulting in significant decline in consumption of food and water over the course of the last week.  He confirms postoperative analgesic regimen that includes as needed oxycodone.  Denies any recent subjective fever, chills, rigors, or generalized myalgias.  He conveys that he is continue to produce urine, denies any recent dysuria, gross hematuria, or change in urinary urgency/frequency.  Denies any recent chest pain, palpitations, diaphoresis, dizziness, presyncope, or syncope.  He also denies any recent shortness of breath, orthopnea, PND, or worsening of peripheral edema.  No recent cough, mopped assist, wheezing.  Denies any recent abdominal pain, flank pain, diarrhea, melena, or hematochezia.    ED Course:  Vital signs in the ED were notable for the following:  -Temperature max 98.6, heart rate 79-84; blood pressure 105/88 -112/58, respiratory rate 18-19, oxygen saturation 98 to 100% on room air.  Labs were notable for the following: CMP notable for the following: Sodium 126 compared to most recent prior value 132 on 01/30/2021, potassium 5.9, bicarbonate 16, anion gap 14, BUN 105, creatinine 6.78, glucose 108, calcium corrected for mild hypoalbuminemia noted to be 10.7, otherwise liver enzymes found to be within normal limits.  CBC notable for white blood cell count 6900, hemoglobin 8.8 with normocytic/normochromic findings.  Urinalysis showed no white blood cells, leukocyte Estrace negative, nitrate negative, no red blood cells, and was negative for  protein.  COVID-19/influenza PCR were  checked in the ED today and found to be negative.  Chest x-ray showed no evidence of acute cardiopulmonary process, including no evidence of infiltrate, edema, effusion, or pneumothorax.  EKG showed sinus rhythm with sinus arrhythmia, nonspecific intraventricular conduction block, and ST elevation limited to V2, which appears unchanged from most recent prior EKG on 09/08/2020   EDP discussed the patient's case with the on-call nephrologist, Dr. Joelyn Oms, who suspects significant contribution towards patient's acute renal failure from dehydration given patient's report of recent decline in oral intake.  Dr. Joelyn Oms suspects that renal function and associated hyperkalemia will improve with interval IV fluids alone.  He recommends pursuit of renal ultrasound, and is amenable to formal consultation in the morning if ensuing renal function does not improve with interval IV fluids.  He specifically does not feel that there are any indications at this time for urgent overnight hemodialysis.  While in the ED, the following were administered: Normal saline x1 L bolus.  Zosyn, the patient was admitted to the PCU for further evaluation management of presenting acute renal failure and hyperkalemia.     Review of Systems: As per HPI otherwise 10 point review of systems negative.   Past Medical History:  Diagnosis Date   Anxiety    Arthritis    Bipolar 1 disorder, depressed ([place])    Bipolar disorder ([place])    Depression    History of posttraumatic stress disorder (PTSD)    Hypertension    PTSD (post-traumatic stress disorder)    Seizures ([place])    Sleep apnea    Smoker    Tobacco use disorder     Past Surgical History:  Procedure Laterality Date   ANTERIOR LATERAL LUMBAR FUSION 4 LEVELS N/A 12/12/2020   Procedure: Anterior Lateral Interbody Fusion Lumbar One-Two, Lumbar Two-Three, Lumbar Three-Four, Lumbar Four-Five;  Surgeon: Franchot Gallo, MD;  Location: [place];  Service: Urology;  Laterality: N/A;   Anterior Lateral Interbody Fusion Lumbar One-Two, Lumbar Two-Three, Lumbar Three-Four, Lumbar Four-Five   APPLICATION OF INTRAOPERATIVE CT SCAN N/A 12/13/2020   Procedure: APPLICATION OF INTRAOPERATIVE CT SCAN;  Surgeon: Vallarie Mare, MD;  Location: [facility] OR;  Service: Neurosurgery;  Laterality: N/A;   APPLICATION OF WOUND VAC N/A 01/17/2021   Procedure: APPLICATION OF WOUND VAC;  Surgeon: Vallarie Mare, MD;  Location: [place];  Service: Neurosurgery;  Laterality: N/A;   CYSTOSCOPY  12/12/2020   Procedure: CYSTOSCOPY, URETHRAL DILATION, DIFFICULT FOLEY INSERTION;  Surgeon: Franchot Gallo, MD;  Location: [place];  Service: Urology;;   IR FLUORO GUIDE CV LINE RIGHT  12/21/2020   IR IVC FILTER PLMT / S&I /IMG GUID/MOD SED  12/16/2020   IR REMOVAL TUN CV CATH W/O FL  12/31/2020   IR US GUIDE VASC ACCESS RIGHT  12/21/2020   POSTERIOR LUMBAR FUSION 4 LEVEL N/A 12/13/2020   Procedure: Thoracic Ten - ILIAC FUSION, L5-S1 TLIF, Posterior osteotomies for deformity correction and decompression L2-3, L3-4, L4-5;  Surgeon: Vallarie Mare, MD;  Location: [place];  Service: Neurosurgery;  Laterality: N/A;   WOUND EXPLORATION N/A 01/17/2021   Procedure: THORACOLUMBAR WOUND EXPLORATION;  Surgeon: Vallarie Mare, MD;  Location: [place];  Service: Neurosurgery;  Laterality: N/A;    Social History:  reports that he has been smoking cigarettes. He has a 10.00 pack-year smoking history. He has never used smokeless tobacco. He reports current alcohol use of about 2.0 standard drinks per week. He reports current drug use. Drug: Marijuana.  No Known Allergies  Family History  Problem Relation Age of Onset   Seizures Mother    Breast cancer Mother 47   Hypertension Brother     Family history reviewed and not pertinent    Prior to Admission medications   Medication Sig Start Date End Date Taking? Authorizing Provider  acetaminophen (TYLENOL) 500 MG tablet Take 2 tablets (1,000 mg total) by mouth every 6 (six)  hours as needed for mild pain. 04/25/19  Yes Meuth, Brooke A, PA-C  aspirin 81 MG chewable tablet Chew 1 tablet (81 mg total) by mouth daily. 02/01/21  Yes Vallarie Mare, MD  Cholecalciferol (VITAMIN D3) 50 MCG (2000 UT) TABS Take 2,000 Units by mouth in the morning and at bedtime.   Yes [provider]  cyclobenzaprine (FLEXERIL) 10 MG tablet Take 1 tablet (10 mg total) by mouth 3 (three) times daily as needed for muscle spasms. Patient taking differently: Take 10 mg by mouth every 8 (eight) hours as needed for muscle spasms. 01/31/21  Yes Vallarie Mare, MD  docusate sodium (COLACE) 100 MG capsule Take 1 capsule (100 mg total) by mouth 2 (two) times daily as needed for mild constipation. Patient taking differently: Take 100 mg by mouth every 12 (twelve) hours as needed (for constipation). 01/31/21  Yes Vallarie Mare, MD  enoxaparin (LOVENOX) 40 MG/0.4ML injection Inject 0.4 mLs (40 mg total) into the skin daily for 28 days. 02/01/21 03/01/21 Yes Vallarie Mare, MD  Ensure (ENSURE) Take 237 mLs by mouth 3 (three) times daily.   Yes [provider]  folic acid (FOLVITE) 1 MG tablet Take 1 tablet (1 mg total) by mouth daily. 02/01/21 03/03/21 Yes Vallarie Mare, MD  gabapentin (NEURONTIN) 300 MG capsule Take 900 mg by mouth 2 (two) times daily.   Yes [provider]  hydrOXYzine (VISTARIL) 50 MG capsule Take 50 mg by mouth in the morning and at bedtime.   Yes [provider]  KEPPRA 750 MG tablet Take 1,500 mg by mouth 2 (two) times daily.   Yes [provider]  LAMICTAL 100 MG tablet Take 100 mg by mouth 2 (two) times daily.   Yes [provider]  oxyCODONE 10 MG TABS Take 1 tablet (10 mg total) by mouth every 4 (four) hours as needed for moderate pain ((score 4 to 6)). 01/31/21  Yes Vallarie Mare, MD  pantoprazole (PROTONIX) 40 MG tablet Take 1 tablet (40 mg total) by mouth at bedtime. 01/31/21  Yes Vallarie Mare, MD   QUEtiapine (SEROQUEL) 200 MG tablet Take 200 mg by mouth at bedtime.   Yes [provider]  sertraline (ZOLOFT) 100 MG tablet Take 100 mg by mouth daily after breakfast.   Yes [provider]  sulfamethoxazole-trimethoprim (BACTRIM DS) 800-160 MG tablet Take 1 tablet by mouth 2 (two) times daily for 28 days. Patient taking differently: Take 1 tablet by mouth every 12 (twelve) hours. 01/31/21 02/28/21 Yes Vallarie Mare, MD  vitamin B-12 1000 MCG tablet Take 1 tablet (1,000 mcg total) by mouth daily. 02/01/21 03/03/21 Yes Vallarie Mare, MD  zinc sulfate 220 (50 Zn) MG capsule Take 1 capsule (220 mg total) by mouth daily. 02/01/21 03/03/21 Yes Vallarie Mare, MD  amLODipine (NORVASC) 2.5 MG tablet Take 1 tablet (2.5 mg total) by mouth daily. Patient not taking: No sig reported 02/01/21   Vallarie Mare, MD  bacitracin ointment Apply topically daily. Patient not taking: Reported on 02/07/2021 02/01/21  Vallarie Mare, MD  feeding supplement (ENSURE ENLIVE / ENSURE PLUS) LIQD Take 237 mLs by mouth 3 (three) times daily between meals. Patient not taking: Reported on 02/07/2021 01/31/21 03/02/21  Vallarie Mare, MD  lamoTRIgine (LAMICTAL) 100 MG tablet Take 1 tablet (100 mg total) by mouth 2 (two) times daily. Patient not taking: Reported on 02/07/2021 09/14/20   Oswald Hillock, MD  lisinopril-hydrochlorothiazide (ZESTORETIC) 20-12.5 MG tablet Take 1 tablet by mouth in the morning. Patient not taking: No sig reported    [provider]     Objective    Physical Exam: Vitals:   02/07/21 1506 02/07/21 1630 02/07/21 1700 02/07/21 1951  BP: 111/61 105/88 (!) 112/58 (!) 111/59  Pulse: 79 84 79 79  Resp: 18 18 19 13   Temp: 98.6 F (37 C)     TempSrc: Oral     SpO2: 100% 98% 99% 99%    General: appears to be stated age; alert, oriented Skin: warm, dry, no rash Head:  AT/[place] Mouth:  Oral mucosa membranes appear dry, normal dentition Neck: supple;  trachea midline Heart:  RRR; did not appreciate any M/R/G Lungs: CTAB, did not appreciate any wheezes, rales, or rhonchi Abdomen: + BS; soft, ND, NT Vascular: 2+ pedal pulses b/l; 2+ radial pulses b/l Extremities: no peripheral edema, no muscle wasting Neuro: strength and sensation intact in upper and lower extremities b/l    Labs on Admission: I have personally reviewed following labs and imaging studies  CBC: Recent Labs  Lab 02/07/21 1527  WBC 6.9  NEUTROABS 5.5  HGB 8.8*  HCT 27.5*  MCV 81.8  PLT 627*   Basic Metabolic Panel: Recent Labs  Lab 02/07/21 1527  NA 126*  K 5.9*  CL 96*  CO2 16*  GLUCOSE 108*  BUN 105*  CREATININE 6.78*  CALCIUM 9.6   GFR: CrCl cannot be calculated (Unknown ideal weight.). Liver Function Tests: Recent Labs  Lab 02/07/21 1527  AST 13*  ALT 12  ALKPHOS 89  BILITOT 0.3  PROT 6.9  ALBUMIN 2.7*   No results for input(s): LIPASE, AMYLASE in the last 168 hours. No results for input(s): AMMONIA in the last 168 hours. Coagulation Profile: No results for input(s): INR, PROTIME in the last 168 hours. Cardiac Enzymes: No results for input(s): CKTOTAL, CKMB, CKMBINDEX, TROPONINI in the last 168 hours. BNP (last 3 results) No results for input(s): PROBNP in the last 8760 hours. HbA1C: No results for input(s): HGBA1C in the last 72 hours. CBG: No results for input(s): GLUCAP in the last 168 hours. Lipid Profile: No results for input(s): CHOL, HDL, LDLCALC, TRIG, CHOLHDL, LDLDIRECT in the last 72 hours. Thyroid Function Tests: No results for input(s): TSH, T4TOTAL, FREET4, T3FREE, THYROIDAB in the last 72 hours. Anemia Panel: No results for input(s): VITAMINB12, FOLATE, FERRITIN, TIBC, IRON, RETICCTPCT in the last 72 hours. Urine analysis:    Component Value Date/Time   COLORURINE YELLOW 12/30/2020 1257   APPEARANCEUR CLEAR 12/30/2020 1257   LABSPEC 1.014 12/30/2020 1257   PHURINE 6.0 12/30/2020 1257   GLUCOSEU NEGATIVE  12/30/2020 1257   [place] 12/30/2020 [place] 12/30/2020 1257   BILIRUBINUR neg 11/06/2010 1037   KETONESUR NEGATIVE 12/30/2020 1257   PROTEINUR NEGATIVE 12/30/2020 1257   UROBILINOGEN 0.2 11/27/2013 1333   NITRITE NEGATIVE 12/30/2020 [place] 12/30/2020 1257    Radiological Exams on Admission: DG Chest 1 View  Result Date: 02/07/2021 CLINICAL DATA:  Lethargy, dizziness. EXAM: CHEST  1 VIEW COMPARISON:  Chest x-ray 12/30/2020 . FINDINGS: The lungs are clear. There is no pleural effusion or pneumothorax. The cardiomediastinal silhouette is within normal limits. Right-sided central venous catheter has been removed. Thoracolumbar fusion hardware is present. IMPRESSION: No acute cardiopulmonary process. Electronically Signed   By: Ronney Asters M.D.   On: 02/07/2021 18:24   CT HEAD WO CONTRAST (5MM)  Result Date: 02/07/2021 CLINICAL DATA:  Mental status change. EXAM: CT HEAD WITHOUT CONTRAST TECHNIQUE: Contiguous axial images were obtained from the base of the skull through the vertex without intravenous contrast. COMPARISON:  MRI head 12/22/2020. FINDINGS: Brain: No evidence of acute infarction, hemorrhage, hydrocephalus, extra-axial collection or mass lesion/mass effect. 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 Vascular: No hyperdense vessel or unexpected calcification. Skull: Normal. Negative for fracture or focal lesion. Sinuses/Orbits: No acute finding. Other: None. IMPRESSION: No acute intracranial abnormality. Chronic ischemic changes as above. Electronically Signed   By: Ronney Asters M.D.   On: 02/07/2021 18:48   US Renal  Result Date: 02/07/2021 CLINICAL DATA:  Renal failure EXAM: RENAL / URINARY TRACT ULTRASOUND COMPLETE COMPARISON:  None. FINDINGS: Right Kidney: Renal measurements: 9.0 x 5.2 x 4.8 cm = volume: 115.13 mL. Echogenicity within normal limits. No mass or hydronephrosis  visualized. Left Kidney: Renal measurements: 10.5 x 5.1 x 4.8 cm = volume: 134.35 mL. Echogenicity within normal limits. No mass or hydronephrosis visualized. Bladder: Appears normal for degree of bladder distention. Other: None. IMPRESSION: Normal renal sonogram. Electronically Signed   By: Kerby Moors M.D.   On: 02/07/2021 18:14     EKG: Independently reviewed, with result as described above.    Assessment/Plan   Keith Dixon is a 60 y.o. male with medical history significant for chronic anemia associated baseline hemoglobin 9-12, seizure disorder, GERD, who is admitted to [facility] on 02/07/2021 with acute renal failure after presenting from home to [facility] ED for further evaluation of outpatient labs reflecting worsening renal function.    Principal Problem:   ARF (acute renal failure) (HCC) Active Problems:   Seizure (HCC)   Hyperkalemia   Hypercalcemia   Generalized weakness   GERD (gastroesophageal reflux disease)   Tobacco abuse     #) Acute renal failure: In the setting of baseline creatinine range of 1.0-1.3, with most recent prior serum creatinine data point noted to be 1.28 on 01/30/2021, presenting serum creatinine found to be 6.78.  Suspect that interval development of acute renal failure is multifactorial in nature, with suspected prerenal contribution from dehydration given patient's report of significant decline in oral intake over the last week, and exacerbated by the potassium sparing diuretic implications of interval Bactrim use, with additional pharmacologic exacerbation from outpatient oxycodone given associated buildup of toxic metabolites in the setting of diminished renal clearance, with these nephrotoxic metabolites further contributing to interval decline in renal function.  Presentation associated with hyperkalemia, although additional labs do not demonstrate the current presence of anion gap metabolic acidosis.  No evidence of acute volume overload or  overt uremia at this time.   patient's case was d/w the on-call nephrologist, Dr. Joelyn Oms, who suspects significant contribution towards patient's acute renal failure from dehydration given patient's report of recent decline in oral intake.  Dr. Joelyn Oms suspects that renal function and associated hyperkalemia will improve with interval IV fluids alone.  He recommends pursuit of renal ultrasound, and is amenable to formal consultation in the morning if ensuing renal function  does not improve with interval IV fluids.  He specifically does not feel that there are any indications at this time for urgent overnight hemodialysis.    Plan: Lactated Ringer's at 100 cc/h.  Monitor strict I's and O's Daily weights.  Tempt avoid nephrotoxic agents.  Hold home oxycodone as well as gabapentin.  Renal ultrasound, as above.  Further evaluation management of associated presenting hyper kalemia.  Monitor on telemetry.  Check serum magnesium and phosphorus levels.  Add on random urine sodium as well as random urine creatinine.  Check CPK.  Repeat CMP in the morning.  Hold home Bactrim.  Holding home prophylactic Lovenox for now in the setting of acute renal failure.       #) Hyperkalemia: In the setting of presenting acute renal failure, presented in a serum potassium level noted to be elevated at 5.9 with EKG findings, as further detailed above.  Beyond implications of overt acute renal failure, additional contributions from the potassium sparing diuretic effects of interval use of Bactrim, as further detailed above.  No indication for urgent overnight hemodialysis, per discussions with on-call nephrology, as above, who recommends management via interval IV fluids.  Refraining from use of calcium gluconate at this time given concomitant hypercalcemia at this time.  Plan: Further evaluation management of presenting acute renal failure, including interval continuous lactated Ringer's, as above.  Repeat CMP overnight to  trend interval serum potassium level as well as serum calcium level response to interval IV fluids.  Repeat CMP in the morning.  Monitor oximetry.  Add on serum magnesium level.  Repeat EKG ordered for the morning.      #) Hypercalcemia: Calcium corrected for hypoalbuminemia noted to be 10.7.  Suspect contribution from dehydration in the context of recent decline in oral intake as well as effect of use of potassium sparing diuretic in the interval in the form of Bactrim, as above.  We will provide interval IV fluids, with consideration for expanding work-up should repeat a.m. labs reflect interval improvement with this measure.  Plan: IV fluids, as above.  Repeat CMP in the morning.  Add on serum magnesium level plan for repeat check in the morning.      #) Generalized weakness: the patient reports 3-4 day duration of generalized weakness, in the absence of any evidence of acute focal neurologic deficits, including no evidence of acute focal weakness. Consequently, acute ischemic CVA is felt to be less likely at this time.  No evidence of underlying infectious contribution at this time, as further detailed above.  Rather suspect physiologic stress stemming from dehydration and consequential acute renal failure, as further detailed above.   Plan: Further evaluation management of acute renal failure, including interval on continuous IV fluids, as above.  Physical therapy consult has been placed for the morning.  Check TSH.  Repeat CMP and CBC in the morning.      #) Seizure disorder: Documented history of such, on Lamictal as well as Keppra as an outpatient.  Plan: Continue Keppra and Lamictal, as above.      #) Chronic tobacco abuse: Patient acknowledges that he is a current smoker,  having smoked half pack per day for at least the last 20 years.  Plan: Counseled the patient on the portance of complete smoking discontinuation.      #) GERD: On Protonix as an outpatient.  Plan:  Continue home PPI.     DVT prophylaxis: SCDs; holding home Lovenox for now the setting of presenting acute renal failure  Code Status: Full code Family Communication: none Disposition Plan: Per Rounding Team Consults called: case discussed with on-call nephrology, Dr. Joelyn Oms, as further detailed above;  Admission status: Inpatient; PCU     Of note, this patient was added by me to the following Admit List/Treatment Team: mcadmits.    Of note, the Adult Admission Order Set (Multimorbid order set) was used by me in the admission process for this patient.   PLEASE NOTE THAT DRAGON DICTATION SOFTWARE WAS USED IN THE CONSTRUCTION OF THIS NOTE.   [place] [facility] Pager [phone] From [place]  Otherwise, please contact night-coverage  [email or web address] Password [facility]   02/07/2021, 8:26 PM

## 2021-02-07 NOTE — ED Triage Notes (Signed)
Pt arrives via EMS from [facility] with reports of abnormal labs related to his kidneys. Pt having back pain and lethargic. Pt bedbound and dizzy when sitting up.

## 2021-02-08 ENCOUNTER — Encounter (HOSPITAL_COMMUNITY): Payer: Self-pay | Admitting: Internal Medicine

## 2021-02-08 DIAGNOSIS — K219 Gastro-esophageal reflux disease without esophagitis: Secondary | ICD-10-CM | POA: Diagnosis present

## 2021-02-08 DIAGNOSIS — E875 Hyperkalemia: Secondary | ICD-10-CM | POA: Diagnosis present

## 2021-02-08 DIAGNOSIS — N179 Acute kidney failure, unspecified: Secondary | ICD-10-CM | POA: Diagnosis not present

## 2021-02-08 DIAGNOSIS — Z720 Tobacco use: Secondary | ICD-10-CM | POA: Diagnosis present

## 2021-02-08 DIAGNOSIS — R531 Weakness: Secondary | ICD-10-CM | POA: Diagnosis not present

## 2021-02-08 LAB — COMPREHENSIVE METABOLIC PANEL
ALT: 13 U/L (ref 0–44)
ALT: 12 U/L (ref 0–44)
AST: 12 U/L — ABNORMAL LOW (ref 15–41)
AST: 13 U/L — ABNORMAL LOW (ref 15–41)
Albumin: 2.7 g/dL — ABNORMAL LOW (ref 3.5–5.0)
Albumin: 2.8 g/dL — ABNORMAL LOW (ref 3.5–5.0)
Alkaline Phosphatase: 85 U/L (ref 38–126)
Alkaline Phosphatase: 88 U/L (ref 38–126)
Anion gap: 12 (ref 5–15)
Anion gap: 13 (ref 5–15)
BUN: 99 mg/dL — ABNORMAL HIGH (ref 6–20)
BUN: 95 mg/dL — ABNORMAL HIGH (ref 6–20)
CO2: 17 mmol/L — ABNORMAL LOW (ref 22–32)
CO2: 18 mmol/L — ABNORMAL LOW (ref 22–32)
Calcium: 9.6 mg/dL (ref 8.9–10.3)
Calcium: 9.8 mg/dL (ref 8.9–10.3)
Chloride: 100 mmol/L (ref 98–111)
Chloride: 101 mmol/L (ref 98–111)
Creatinine, Ser: 6.12 mg/dL — ABNORMAL HIGH (ref 0.61–1.24)
Creatinine, Ser: 5.54 mg/dL — ABNORMAL HIGH (ref 0.61–1.24)
GFR, Estimated: 10 mL/min — ABNORMAL LOW (ref >60)
GFR, Estimated: 11 mL/min — ABNORMAL LOW (ref >60)
Glucose, Bld: 104 mg/dL — ABNORMAL HIGH (ref 70–99)
Glucose, Bld: 104 mg/dL — ABNORMAL HIGH (ref 70–99)
Potassium: 5.6 mmol/L — ABNORMAL HIGH (ref 3.5–5.1)
Potassium: 5.6 mmol/L — ABNORMAL HIGH (ref 3.5–5.1)
Sodium: 129 mmol/L — ABNORMAL LOW (ref 135–145)
Sodium: 132 mmol/L — ABNORMAL LOW (ref 135–145)
Total Bilirubin: 0.5 mg/dL (ref 0.3–1.2)
Total Bilirubin: 0.6 mg/dL (ref 0.3–1.2)
Total Protein: 6.7 g/dL (ref 6.5–8.1)
Total Protein: 6.8 g/dL (ref 6.5–8.1)

## 2021-02-08 LAB — CBC
HCT: 27.8 % — ABNORMAL LOW (ref 39.0–52.0)
Hemoglobin: 9.1 g/dL — ABNORMAL LOW (ref 13.0–17.0)
MCH: 26.8 pg (ref 26.0–34.0)
MCHC: 32.7 g/dL (ref 30.0–36.0)
MCV: 81.8 fL (ref 80.0–100.0)
Platelets: 451 10*3/uL — ABNORMAL HIGH (ref 150–400)
RBC: 3.4 MIL/uL — ABNORMAL LOW (ref 4.22–5.81)
RDW: 16.7 % — ABNORMAL HIGH (ref 11.5–15.5)
WBC: 6.3 10*3/uL (ref 4.0–10.5)
nRBC: 0 % (ref 0.0–0.2)

## 2021-02-08 LAB — BASIC METABOLIC PANEL
Anion gap: 10 (ref 5–15)
BUN: 84 mg/dL — ABNORMAL HIGH (ref 6–20)
CO2: 20 mmol/L — ABNORMAL LOW (ref 22–32)
Calcium: 9.9 mg/dL (ref 8.9–10.3)
Chloride: 101 mmol/L (ref 98–111)
Creatinine, Ser: 3.86 mg/dL — ABNORMAL HIGH (ref 0.61–1.24)
GFR, Estimated: 17 mL/min — ABNORMAL LOW (ref >60)
Glucose, Bld: 99 mg/dL (ref 70–99)
Potassium: 5.9 mmol/L — ABNORMAL HIGH (ref 3.5–5.1)
Sodium: 131 mmol/L — ABNORMAL LOW (ref 135–145)

## 2021-02-08 LAB — PROTIME-INR
INR: 1.2 (ref 0.8–1.2)
Prothrombin Time: 15.6 seconds — ABNORMAL HIGH (ref 11.4–15.2)

## 2021-02-08 LAB — MAGNESIUM
Magnesium: 2.6 mg/dL — ABNORMAL HIGH (ref 1.7–2.4)
Magnesium: 2.6 mg/dL — ABNORMAL HIGH (ref 1.7–2.4)

## 2021-02-08 LAB — PHOSPHORUS: Phosphorus: 7 mg/dL — ABNORMAL HIGH (ref 2.5–4.6)

## 2021-02-08 LAB — CK: Total CK: 82 U/L (ref 49–397)

## 2021-02-08 MED ORDER — HEPARIN SODIUM (PORCINE) 5000 UNIT/ML IJ SOLN
5000.0000 [IU] | Freq: Three times a day (TID) | INTRAMUSCULAR | Status: DC
  Administered 2021-02-08 – 2021-02-11 (×11): 5000 [IU] via SUBCUTANEOUS
  Filled 2021-02-08 (×10): qty 1

## 2021-02-08 MED ORDER — VITAMIN B-12 1000 MCG PO TABS
1000.0000 ug | ORAL_TABLET | Freq: Every day | ORAL | Status: DC
  Administered 2021-02-08 – 2021-02-11 (×4): 1000 ug via ORAL
  Filled 2021-02-08 (×4): qty 1

## 2021-02-08 MED ORDER — LACTATED RINGERS IV SOLN: INTRAVENOUS | Status: DC

## 2021-02-08 MED ORDER — SERTRALINE HCL 100 MG PO TABS
100.0000 mg | ORAL_TABLET | Freq: Every day | ORAL | Status: DC
  Administered 2021-02-09 – 2021-02-11 (×3): 100 mg via ORAL
  Filled 2021-02-08 (×3): qty 1

## 2021-02-08 MED ORDER — GABAPENTIN 100 MG PO CAPS
100.0000 mg | ORAL_CAPSULE | Freq: Two times a day (BID) | ORAL | Status: DC
  Administered 2021-02-08 – 2021-02-11 (×8): 100 mg via ORAL
  Filled 2021-02-08 (×8): qty 1

## 2021-02-08 MED ORDER — OXYCODONE HCL 5 MG PO TABS
10.0000 mg | ORAL_TABLET | ORAL | Status: DC | PRN
Start: 2021-02-08 — End: 2021-02-12
  Administered 2021-02-08: 10 mg via ORAL
  Filled 2021-02-08: qty 2

## 2021-02-08 NOTE — Progress Notes (Signed)
PROGRESS NOTE    CLINE DRAHEIM  ZOX:[id number] DOB: [birth year] DOA: 02/07/2021 PCP: Reubin Milan, MD    Brief Narrative:  60 year old gentleman with history of chronic anemia, seizure disorder, GERD presented to the ER with abnormal outpatient labs reflecting acute renal failure.  Recently hospitalized 8/3-9/22 with severe lumbar stenosis, multiple surgical interventions.  Hospital course was complicated by acute renal failure and patient was transiently on hemodialysis.  Thoracolumbar wound exploration on 9/8.  Renal functions improved and creatinine was 1.28 on 9/21 when he was discharged with 4-week course of Bactrim for his thoracolumbar wound.  Also on Lovenox.  Patient himself denies any complaints.  Has poor intake. In the emergency room, hemodynamically stable. Sodium 126, potassium 5.9, bicarb 16, creatinine 6.78 with recent creatinine of 1.2.  Recently stayed in the hospital for more than 50 days with at least 4 spinal surgeries, intubation extubation, tracheostomy and ultimately on room air, hemodialysis and recovery of renal function.  Discharged on prophylactic Bactrim for 6 weeks.   Assessment & Plan:   Principal Problem:   ARF (acute renal failure) (HCC) Active Problems:   Seizure (HCC)   Hyperkalemia   Hypercalcemia   Generalized weakness   GERD (gastroesophageal reflux disease)   Tobacco abuse  Acute renal failure with hyperkalemia: Probably multifactorial.  Patient is on Bactrim, lisinopril, has poor oral intake. Presented with hyperkalemia. Currently no evidence of uremia.  Bicarbonate is stable.  He already had more than 1 L urine overnight.  Continue isotonic fluid.  Monitor strict intake and output.  Renal ultrasound is without any evidence of hydronephrosis.  Electrolytes are adequate. Complete urinalysis, urine electrolytes pending. No need for urgent hemodialysis at this time.  Seizure disorder: On Lamictal and Keppra.  Continued.  Smoker: Counseled  to quit.  On nicotine patch.  GERD: On PPI.  Surgical wound: Reportedly healing.  It looks like he was started on Bactrim for Klebsiella on his wound culture from his back.   Discussed with Dr. Marcello Moores, recommended no need for continuing antibiotics.   He has a stable spine.  Can mobilize with lumbar support.   Has significant pain.  Resume home medications including low-dose oxycodone, decrease dose of gabapentin, decrease dose of Flexeril and start mobilizing with lumbar support.   DVT prophylaxis: heparin injection 5,000 Units Start: 02/08/21 1400 SCDs Start: 02/07/21 2023   Code Status: Full code Family Communication: called patient's significant other, unable to talk. Disposition Plan: Status is: Inpatient  Remains inpatient appropriate because:Inpatient level of care appropriate due to severity of illness  Dispo: The patient is from: SNF              Anticipated d/c is to: SNF              Patient currently is not medically stable to d/c.   Difficult to place patient No         Consultants:  Nephrology, curbside on admission recommended conservative management. Neurosurgery, Dr. Marcello Moores.  Curbside.  Procedures:  None  Antimicrobials:  None   Subjective: Patient seen and examined.  He is in the emergency room.  Complains of some back pain.  Denies any nausea vomiting.  Poor historian overall. Has external condom catheter with adequate urine in the canister.  Objective: Vitals:   02/08/21 0900 02/08/21 0930 02/08/21 0945 02/08/21 1030  BP: (!) 142/84  132/68 126/69  Pulse:  80 76 75  Resp:  11 12 13   Temp:      TempSrc:  SpO2:  96% 98% 100%    Intake/Output Summary (Last 24 hours) at 02/08/2021 1145 Last data filed at 02/08/2021 0933 Gross per 24 hour  Intake 1934.14 ml  Output 1050 ml  Net 884.14 ml   There were no vitals filed for this visit.  Examination:  General exam: Appears calm and comfortable  Chronically sick looking.  Frail and  debilitated.  Not in any distress. Respiratory system: Clear to auscultation. Respiratory effort normal.  No added sounds. Cardiovascular system: S1 & S2 heard, RRR.  Gastrointestinal system: Abdomen is nondistended, soft and nontender. No organomegaly or masses felt. Normal bowel sounds heard. Central nervous system: Alert and oriented.  Generalized weakness both legs. Patient has dry and healed multiple incisions on the back. There is a pinpoint open wound on the thoracic spine which is dry without drainage.    Data Reviewed: I have personally reviewed following labs and imaging studies  CBC: Recent Labs  Lab 02/07/21 1527 02/08/21 0404  WBC 6.9 6.3  NEUTROABS 5.5  --   HGB 8.8* 9.1*  HCT 27.5* 27.8*  MCV 81.8 81.8  PLT 478* 427*   Basic Metabolic Panel: Recent Labs  Lab 02/07/21 1527 02/07/21 2328 02/08/21 0404  NA 126* 129* 132*  K 5.9* 5.6* 5.6*  CL 96* 100 101  CO2 16* 17* 18*  GLUCOSE 108* 104* 104*  BUN 105* 99* 95*  CREATININE 6.78* 6.12* 5.54*  CALCIUM 9.6 9.6 9.8  MG  --  2.6* 2.6*  PHOS  --   --  7.0*   GFR: CrCl cannot be calculated (Unknown ideal weight.). Liver Function Tests: Recent Labs  Lab 02/07/21 1527 02/07/21 2328 02/08/21 0404  AST 13* 12* 13*  ALT 12 13 12   ALKPHOS 89 85 88  BILITOT 0.3 0.5 0.6  PROT 6.9 6.7 6.8  ALBUMIN 2.7* 2.7* 2.8*   No results for input(s): LIPASE, AMYLASE in the last 168 hours. No results for input(s): AMMONIA in the last 168 hours. Coagulation Profile: Recent Labs  Lab 02/08/21 0404  INR 1.2   Cardiac Enzymes: Recent Labs  Lab 02/07/21 2328  CKTOTAL 82   BNP (last 3 results) No results for input(s): PROBNP in the last 8760 hours. HbA1C: No results for input(s): HGBA1C in the last 72 hours. CBG: No results for input(s): GLUCAP in the last 168 hours. Lipid Profile: No results for input(s): CHOL, HDL, LDLCALC, TRIG, CHOLHDL, LDLDIRECT in the last 72 hours. Thyroid Function Tests: No results for  input(s): TSH, T4TOTAL, FREET4, T3FREE, THYROIDAB in the last 72 hours. Anemia Panel: No results for input(s): VITAMINB12, FOLATE, FERRITIN, TIBC, IRON, RETICCTPCT in the last 72 hours. Sepsis Labs: No results for input(s): PROCALCITON, LATICACIDVEN in the last 168 hours.  Recent Results (from the past 240 hour(s))  SARS CORONAVIRUS 2 (TAT 6-24 HRS) Nasopharyngeal Nasopharyngeal Swab     Status: None   Collection Time: 01/29/21  3:06 PM   Specimen: Nasopharyngeal Swab  Result Value Ref Range Status   SARS Coronavirus 2 NEGATIVE NEGATIVE Final    Comment: (NOTE) SARS-CoV-2 target nucleic acids are NOT DETECTED.  The SARS-CoV-2 RNA is generally detectable in upper and lower respiratory specimens during the acute phase of infection. Negative results do not preclude SARS-CoV-2 infection, do not rule out co-infections with other pathogens, and should not be used as the sole basis for treatment or other patient management decisions. Negative results must be combined with clinical observations, patient history, and epidemiological information. The expected result is Negative.  Fact Sheet for Patients: SugarRoll.be  Fact Sheet for Healthcare Providers: https://www.woods-mathews.com/  This test is not yet approved or cleared by the [place] FDA and  has been authorized for detection and/or diagnosis of SARS-CoV-2 by FDA under an Emergency Use Authorization (EUA). This EUA will remain  in effect (meaning this test can be used) for the duration of the COVID-19 declaration under Se ction 564(b)(1) of the Act, 21 U.S.C. section 360bbb-3(b)(1), unless the authorization is terminated or revoked sooner.  Performed at [facility], [place] [address] [place], [place] [address]   Resp Panel by RT-PCR (Flu A&B, Covid) Nasopharyngeal Swab     Status: None   Collection Time: 02/07/21  7:53 PM   Specimen: Nasopharyngeal Swab; Nasopharyngeal(NP)  swabs in vial transport medium  Result Value Ref Range Status   SARS Coronavirus 2 by RT PCR NEGATIVE NEGATIVE Final    Comment: (NOTE) SARS-CoV-2 target nucleic acids are NOT DETECTED.  The SARS-CoV-2 RNA is generally detectable in upper respiratory specimens during the acute phase of infection. The lowest concentration of SARS-CoV-2 viral copies this assay can detect is 138 copies/mL. A negative result does not preclude SARS-Cov-2 infection and should not be used as the sole basis for treatment or other patient management decisions. A negative result may occur with  improper specimen collection/handling, submission of specimen other than nasopharyngeal swab, presence of viral mutation(s) within the areas targeted by this assay, and inadequate number of viral copies(<138 copies/mL). A negative result must be combined with clinical observations, patient history, and epidemiological information. The expected result is Negative.  Fact Sheet for Patients:  EntrepreneurPulse.com.au  Fact Sheet for Healthcare Providers:  IncredibleEmployment.be  This test is no t yet approved or cleared by the [place] FDA and  has been authorized for detection and/or diagnosis of SARS-CoV-2 by FDA under an Emergency Use Authorization (EUA). This EUA will remain  in effect (meaning this test can be used) for the duration of the COVID-19 declaration under Section 564(b)(1) of the Act, 21 U.S.C.section 360bbb-3(b)(1), unless the authorization is terminated  or revoked sooner.       Influenza A by PCR NEGATIVE NEGATIVE Final   Influenza B by PCR NEGATIVE NEGATIVE Final    Comment: (NOTE) The Xpert Xpress SARS-CoV-2/FLU/RSV plus assay is intended as an aid in the diagnosis of influenza from Nasopharyngeal swab specimens and should not be used as a sole basis for treatment. Nasal washings and aspirates are unacceptable for Xpert Xpress  SARS-CoV-2/FLU/RSV testing.  Fact Sheet for Patients: EntrepreneurPulse.com.au  Fact Sheet for Healthcare Providers: IncredibleEmployment.be  This test is not yet approved or cleared by the [place] FDA and has been authorized for detection and/or diagnosis of SARS-CoV-2 by FDA under an Emergency Use Authorization (EUA). This EUA will remain in effect (meaning this test can be used) for the duration of the COVID-19 declaration under Section 564(b)(1) of the Act, 21 U.S.C. section 360bbb-3(b)(1), unless the authorization is terminated or revoked.  Performed at [facility], [place] [address], [place], [place] [address]          Radiology Studies: DG Chest 1 View  Result Date: 02/07/2021 CLINICAL DATA:  Lethargy, dizziness. EXAM: CHEST  1 VIEW COMPARISON:  Chest x-ray 12/30/2020 . FINDINGS: The lungs are clear. There is no pleural effusion or pneumothorax. The cardiomediastinal silhouette is within normal limits. Right-sided central venous catheter has been removed. Thoracolumbar fusion hardware is present. IMPRESSION: No acute cardiopulmonary process. Electronically Signed  By: Ronney Asters M.D.   On: 02/07/2021 18:24   CT HEAD WO CONTRAST (5MM)  Result Date: 02/07/2021 CLINICAL DATA:  Mental status change. EXAM: CT HEAD WITHOUT CONTRAST TECHNIQUE: Contiguous axial images were obtained from the base of the skull through the vertex without intravenous contrast. COMPARISON:  MRI head 12/22/2020. FINDINGS: Brain: No evidence of acute infarction, hemorrhage, hydrocephalus, extra-axial collection or mass lesion/mass effect. 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 Vascular: No hyperdense vessel or unexpected calcification. Skull: Normal. Negative for fracture or focal lesion. Sinuses/Orbits: No acute finding. Other: None. IMPRESSION: No acute intracranial abnormality. Chronic ischemic  changes as above. Electronically Signed   By: Ronney Asters M.D.   On: 02/07/2021 18:48   US Renal  Result Date: 02/07/2021 CLINICAL DATA:  Renal failure EXAM: RENAL / URINARY TRACT ULTRASOUND COMPLETE COMPARISON:  None. FINDINGS: Right Kidney: Renal measurements: 9.0 x 5.2 x 4.8 cm = volume: 115.13 mL. Echogenicity within normal limits. No mass or hydronephrosis visualized. Left Kidney: Renal measurements: 10.5 x 5.1 x 4.8 cm = volume: 134.35 mL. Echogenicity within normal limits. No mass or hydronephrosis visualized. Bladder: Appears normal for degree of bladder distention. Other: None. IMPRESSION: Normal renal sonogram. Electronically Signed   By: Kerby Moors M.D.   On: 02/07/2021 18:14        Scheduled Meds:  aspirin  81 mg Oral Daily   folic acid  1 mg Oral Daily   heparin  5,000 Units Subcutaneous Q8H   lamoTRIgine  100 mg Oral BID   levETIRAcetam  1,500 mg Oral BID   pantoprazole  40 mg Oral QHS   QUEtiapine  200 mg Oral QHS   Continuous Infusions:  lactated ringers 100 mL/hr at 02/08/21 1041     LOS: 1 day    Time spent: 35 minutes    Barb Merino, MD [facility] Pager [phone]

## 2021-02-08 NOTE — Progress Notes (Signed)
Received call from [place], person states she is patient's girlfriend and medical poa, wants to be contacted with an update on his care, and to be contacted with any medical questions:  [place] [phone]
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
## 2021-02-08 NOTE — ED Notes (Addendum)
ED TO INPATIENT HANDOFF REPORT  ED Nurse Name and Phone #: Darnelle Maffucci [phone]  M Name/Age/Gender Keith Dixon 60 y.o. male Room/Bed: 014C/014C  Code Status   Code Status: Full Code  Home/SNF/Other  Patient oriented to: self, place, and time Is this baseline? Yes   Triage Complete: Triage complete  Chief Complaint ARF (acute renal failure) ([place]) [N17.9]  Triage Note Pt arrives via EMS from [facility] with reports of abnormal labs related to his kidneys. Pt having back pain and lethargic. Pt bedbound and dizzy when sitting up.   Allergies No Known Allergies  Level of Care/Admitting Diagnosis ED Disposition     ED Disposition  Admit   Condition  --   Comment  Hospital Area: [facility] [[id number]]  Level of Care: Progressive [102]  Admit to Progressive based on following criteria: NEPHROLOGY stable condition requiring close monitoring for AKI, requiring Hemodialysis or Peritoneal Dialysis either from expected electrolyte imbalance, acidosis, or fluid overload that can be managed by NIPPV or high flow oxygen.  May admit patient to [facility] or [facility] if equivalent level of care is available:: No  Covid Evaluation: Asymptomatic Screening Protocol (No Symptoms)  Diagnosis: ARF (acute renal failure) [facility]) [[id number]]  Admitting Physician: Rhetta Mura [[id number]]  Attending Physician: Rhetta Mura [[id number]]  Estimated length of stay: past midnight tomorrow  Certification:: I certify this patient will need inpatient services for at least 2 midnights          B Medical/Surgery History Past Medical History:  Diagnosis Date   Anxiety    Arthritis    Bipolar 1 disorder, depressed ([place])    Bipolar disorder ([place])    Depression    History of posttraumatic stress disorder (PTSD)    Hypertension    PTSD (post-traumatic stress disorder)    Seizures ([place])    Sleep apnea    Smoker    Tobacco use disorder    Past Surgical History:   Procedure Laterality Date   ANTERIOR LATERAL LUMBAR FUSION 4 LEVELS N/A 12/12/2020   Procedure: Anterior Lateral Interbody Fusion Lumbar One-Two, Lumbar Two-Three, Lumbar Three-Four, Lumbar Four-Five;  Surgeon: Franchot Gallo, MD;  Location: [place];  Service: Urology;  Laterality: N/A;  Anterior Lateral Interbody Fusion Lumbar One-Two, Lumbar Two-Three, Lumbar Three-Four, Lumbar Four-Five   APPLICATION OF INTRAOPERATIVE CT SCAN N/A 12/13/2020   Procedure: APPLICATION OF INTRAOPERATIVE CT SCAN;  Surgeon: Vallarie Mare, MD;  Location: [facility] OR;  Service: Neurosurgery;  Laterality: N/A;   APPLICATION OF WOUND VAC N/A 01/17/2021   Procedure: APPLICATION OF WOUND VAC;  Surgeon: Vallarie Mare, MD;  Location: [place];  Service: Neurosurgery;  Laterality: N/A;   CYSTOSCOPY  12/12/2020   Procedure: CYSTOSCOPY, URETHRAL DILATION, DIFFICULT FOLEY INSERTION;  Surgeon: Franchot Gallo, MD;  Location: [place];  Service: Urology;;   IR FLUORO GUIDE CV LINE RIGHT  12/21/2020   IR IVC FILTER PLMT / S&I /IMG GUID/MOD SED  12/16/2020   IR REMOVAL TUN CV CATH W/O FL  12/31/2020   IR US GUIDE VASC ACCESS RIGHT  12/21/2020   POSTERIOR LUMBAR FUSION 4 LEVEL N/A 12/13/2020   Procedure: Thoracic Ten - ILIAC FUSION, L5-S1 TLIF, Posterior osteotomies for deformity correction and decompression L2-3, L3-4, L4-5;  Surgeon: Vallarie Mare, MD;  Location: [place];  Service: Neurosurgery;  Laterality: N/A;   WOUND EXPLORATION N/A 01/17/2021   Procedure: THORACOLUMBAR WOUND EXPLORATION;  Surgeon: Vallarie Mare, MD;  Location: [place];  Service: Neurosurgery;  Laterality: N/A;     A IV Location/Drains/Wounds Patient Lines/Drains/Airways Status     Active Line/Drains/Airways     Name Placement date Placement time Site Days   Peripheral IV 02/07/21 20 G Left Antecubital 02/07/21  1945  Antecubital  1   Incision (Closed) 01/17/21 Back 01/17/21  1105  -- 22   Pressure Injury 12/18/20 Heel Left Deep Tissue Pressure Injury -  Purple or maroon localized area of discolored intact skin or blood-filled blister due to damage of underlying soft tissue from pressure and/or shear. 12/18/20  0800  -- 52            Intake/Output Last 24 hours  Intake/Output Summary (Last 24 hours) at 02/08/2021 1648 Last data filed at 02/08/2021 0933 Gross per 24 hour  Intake 1934.14 ml  Output 1050 ml  Net 884.14 ml    Labs/Imaging Results for orders placed or performed during the hospital encounter of 02/07/21 (from the past 48 hour(s))  CBC with Differential     Status: Abnormal   Collection Time: 02/07/21  3:27 PM  Result Value Ref Range   WBC 6.9 4.0 - 10.5 K/uL   RBC 3.36 (L) 4.22 - 5.81 MIL/uL   Hemoglobin 8.8 (L) 13.0 - 17.0 g/dL   HCT 27.5 (L) 39.0 - 52.0 %   MCV 81.8 80.0 - 100.0 fL   MCH 26.2 26.0 - 34.0 pg   MCHC 32.0 30.0 - 36.0 g/dL   RDW 16.4 (H) 11.5 - 15.5 %   Platelets 478 (H) 150 - 400 K/uL   nRBC 0.0 0.0 - 0.2 %   Neutrophils Relative % 79 %   Neutro Abs 5.5 1.7 - 7.7 K/uL   Lymphocytes Relative 13 %   Lymphs Abs 0.9 0.7 - 4.0 K/uL   Monocytes Relative 6 %   Monocytes Absolute 0.4 0.1 - 1.0 K/uL   Eosinophils Relative 2 %   Eosinophils Absolute 0.1 0.0 - 0.5 K/uL   Basophils Relative 0 %   Basophils Absolute 0.0 0.0 - 0.1 K/uL   Immature Granulocytes 0 %   Abs Immature Granulocytes 0.03 0.00 - 0.07 K/uL    Comment: Performed at [facility], [address] [place], [place] [address]  Comprehensive metabolic panel     Status: Abnormal   Collection Time: 02/07/21  3:27 PM  Result Value Ref Range   Sodium 126 (L) 135 - 145 mmol/L   Potassium 5.9 (H) 3.5 - 5.1 mmol/L    Comment: NO VISIBLE HEMOLYSIS   Chloride 96 (L) 98 - 111 mmol/L   CO2 16 (L) 22 - 32 mmol/L   Glucose, Bld 108 (H) 70 - 99 mg/dL    Comment: Glucose reference range applies only to samples taken after fasting for at least 8 hours.   BUN 105 (H) 6 - 20 mg/dL    Comment: OK PER PADEN B,RN    Creatinine, Ser 6.78 (H) 0.61 -  1.24 mg/dL    Comment: OK PER PADEN B,RN   Calcium 9.6 8.9 - 10.3 mg/dL   Total Protein 6.9 6.5 - 8.1 g/dL   Albumin 2.7 (L) 3.5 - 5.0 g/dL   AST 13 (L) 15 - 41 U/L   ALT 12 0 - 44 U/L   Alkaline Phosphatase 89 38 - 126 U/L   Total Bilirubin 0.3 0.3 - 1.2 mg/dL   GFR, Estimated 9 (L) >60 mL/min    Comment: (NOTE) Calculated using the CKD-EPI Creatinine Equation (2021)    Anion  gap 14 5 - 15    Comment: Performed at [facility], [place] [address], [place], [place] [address]  Urinalysis, Routine w reflex microscopic     Status: None   Collection Time: 02/07/21  3:28 PM  Result Value Ref Range   Color, Urine YELLOW YELLOW   APPearance CLEAR CLEAR   Specific Gravity, Urine 1.011 1.005 - 1.030   pH 5.0 5.0 - 8.0   Glucose, UA NEGATIVE NEGATIVE mg/dL   Hgb urine dipstick NEGATIVE NEGATIVE   Bilirubin Urine NEGATIVE NEGATIVE   Ketones, ur NEGATIVE NEGATIVE mg/dL   Protein, ur NEGATIVE NEGATIVE mg/dL   Nitrite NEGATIVE NEGATIVE   Leukocytes,Ua NEGATIVE NEGATIVE    Comment: Performed at [facility] [address], [place], [place] [address]  Resp Panel by RT-PCR (Flu A&B, Covid) Nasopharyngeal Swab     Status: None   Collection Time: 02/07/21  7:53 PM   Specimen: Nasopharyngeal Swab; Nasopharyngeal(NP) swabs in vial transport medium  Result Value Ref Range   SARS Coronavirus 2 by RT PCR NEGATIVE NEGATIVE    Comment: (NOTE) SARS-CoV-2 target nucleic acids are NOT DETECTED.  The SARS-CoV-2 RNA is generally detectable in upper respiratory specimens during the acute phase of infection. The lowest concentration of SARS-CoV-2 viral copies this assay can detect is 138 copies/mL. A negative result does not preclude SARS-Cov-2 infection and should not be used as the sole basis for treatment or other patient management decisions. A negative result may occur with  improper specimen collection/handling, submission of specimen other than nasopharyngeal swab, presence of viral  mutation(s) within the areas targeted by this assay, and inadequate number of viral copies(<138 copies/mL). A negative result must be combined with clinical observations, patient history, and epidemiological information. The expected result is Negative.  Fact Sheet for Patients:  EntrepreneurPulse.com.au  Fact Sheet for Healthcare Providers:  IncredibleEmployment.be  This test is no t yet approved or cleared by the [place] FDA and  has been authorized for detection and/or diagnosis of SARS-CoV-2 by FDA under an Emergency Use Authorization (EUA). This EUA will remain  in effect (meaning this test can be used) for the duration of the COVID-19 declaration under Section 564(b)(1) of the Act, 21 U.S.C.section 360bbb-3(b)(1), unless the authorization is terminated  or revoked sooner.       Influenza A by PCR NEGATIVE NEGATIVE   Influenza B by PCR NEGATIVE NEGATIVE    Comment: (NOTE) The Xpert Xpress SARS-CoV-2/FLU/RSV plus assay is intended as an aid in the diagnosis of influenza from Nasopharyngeal swab specimens and should not be used as a sole basis for treatment. Nasal washings and aspirates are unacceptable for Xpert Xpress SARS-CoV-2/FLU/RSV testing.  Fact Sheet for Patients: EntrepreneurPulse.com.au  Fact Sheet for Healthcare Providers: IncredibleEmployment.be  This test is not yet approved or cleared by the [place] FDA and has been authorized for detection and/or diagnosis of SARS-CoV-2 by FDA under an Emergency Use Authorization (EUA). This EUA will remain in effect (meaning this test can be used) for the duration of the COVID-19 declaration under Section 564(b)(1) of the Act, 21 U.S.C. section 360bbb-3(b)(1), unless the authorization is terminated or revoked.  Performed at [facility], [place] [address], [place], [place] [address]   Magnesium     Status: Abnormal   Collection  Time: 02/07/21 11:28 PM  Result Value Ref Range   Magnesium 2.6 (H) 1.7 - 2.4 mg/dL    Comment: Performed at [facility]  [address] [place], [place] [address]  Comprehensive metabolic panel     Status: Abnormal   Collection Time: 02/07/21 11:28 PM  Result Value Ref Range   Sodium 129 (L) 135 - 145 mmol/L   Potassium 5.6 (H) 3.5 - 5.1 mmol/L   Chloride 100 98 - 111 mmol/L   CO2 17 (L) 22 - 32 mmol/L   Glucose, Bld 104 (H) 70 - 99 mg/dL    Comment: Glucose reference range applies only to samples taken after fasting for at least 8 hours.   BUN 99 (H) 6 - 20 mg/dL   Creatinine, Ser 6.12 (H) 0.61 - 1.24 mg/dL   Calcium 9.6 8.9 - 10.3 mg/dL   Total Protein 6.7 6.5 - 8.1 g/dL   Albumin 2.7 (L) 3.5 - 5.0 g/dL   AST 12 (L) 15 - 41 U/L   ALT 13 0 - 44 U/L   Alkaline Phosphatase 85 38 - 126 U/L   Total Bilirubin 0.5 0.3 - 1.2 mg/dL   GFR, Estimated 10 (L) >60 mL/min    Comment: (NOTE) Calculated using the CKD-EPI Creatinine Equation (2021)    Anion gap 12 5 - 15    Comment: Performed at [facility] [address] [place], [place] [address]  CK     Status: None   Collection Time: 02/07/21 11:28 PM  Result Value Ref Range   Total CK 82 49 - 397 U/L    Comment: Performed at [facility], [place] [address] [place], [place] [address]  Phosphorus     Status: Abnormal   Collection Time: 02/08/21  4:04 AM  Result Value Ref Range   Phosphorus 7.0 (H) 2.5 - 4.6 mg/dL    Comment: Performed at [facility] [address] [place], [place] [address]  Magnesium     Status: Abnormal   Collection Time: 02/08/21  4:04 AM  Result Value Ref Range   Magnesium 2.6 (H) 1.7 - 2.4 mg/dL    Comment: Performed at [facility] [address] [place], [place] [address]  Comprehensive metabolic panel     Status: Abnormal   Collection Time: 02/08/21  4:04 AM  Result Value Ref Range   Sodium 132 (L) 135 - 145 mmol/L   Potassium 5.6 (H) 3.5 - 5.1 mmol/L   Chloride 101 98  - 111 mmol/L   CO2 18 (L) 22 - 32 mmol/L   Glucose, Bld 104 (H) 70 - 99 mg/dL    Comment: Glucose reference range applies only to samples taken after fasting for at least 8 hours.   BUN 95 (H) 6 - 20 mg/dL   Creatinine, Ser 5.54 (H) 0.61 - 1.24 mg/dL   Calcium 9.8 8.9 - 10.3 mg/dL   Total Protein 6.8 6.5 - 8.1 g/dL   Albumin 2.8 (L) 3.5 - 5.0 g/dL   AST 13 (L) 15 - 41 U/L   ALT 12 0 - 44 U/L   Alkaline Phosphatase 88 38 - 126 U/L   Total Bilirubin 0.6 0.3 - 1.2 mg/dL   GFR, Estimated 11 (L) >60 mL/min    Comment: (NOTE) Calculated using the CKD-EPI Creatinine Equation (2021)    Anion gap 13 5 - 15    Comment: Performed at [facility], [place] [address], [place], [place] [address]  CBC     Status: Abnormal   Collection Time: 02/08/21  4:04 AM  Result Value Ref Range   WBC 6.3 4.0 - 10.5 K/uL  RBC 3.40 (L) 4.22 - 5.81 MIL/uL   Hemoglobin 9.1 (L) 13.0 - 17.0 g/dL   HCT 27.8 (L) 39.0 - 52.0 %   MCV 81.8 80.0 - 100.0 fL   MCH 26.8 26.0 - 34.0 pg   MCHC 32.7 30.0 - 36.0 g/dL   RDW 16.7 (H) 11.5 - 15.5 %   Platelets 451 (H) 150 - 400 K/uL   nRBC 0.0 0.0 - 0.2 %    Comment: Performed at [facility] [address] [place], [place] [address]  Protime-INR     Status: Abnormal   Collection Time: 02/08/21  4:04 AM  Result Value Ref Range   Prothrombin Time 15.6 (H) 11.4 - 15.2 seconds   INR 1.2 0.8 - 1.2    Comment: (NOTE) INR goal varies based on device and disease states. Performed at [facility], [place] [address], [place], [place] [address]    DG Chest 1 View  Result Date: 02/07/2021 CLINICAL DATA:  Lethargy, dizziness. EXAM: CHEST  1 VIEW COMPARISON:  Chest x-ray 12/30/2020 . FINDINGS: The lungs are clear. There is no pleural effusion or pneumothorax. The cardiomediastinal silhouette is within normal limits. Right-sided central venous catheter has been removed. Thoracolumbar fusion hardware is present. IMPRESSION: No acute cardiopulmonary process.  Electronically Signed   By: Ronney Asters M.D.   On: 02/07/2021 18:24   CT HEAD WO CONTRAST (5MM)  Result Date: 02/07/2021 CLINICAL DATA:  Mental status change. EXAM: CT HEAD WITHOUT CONTRAST TECHNIQUE: Contiguous axial images were obtained from the base of the skull through the vertex without intravenous contrast. COMPARISON:  MRI head 12/22/2020. FINDINGS: Brain: No evidence of acute infarction, hemorrhage, hydrocephalus, extra-axial collection or mass lesion/mass effect. 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 Vascular: No hyperdense vessel or unexpected calcification. Skull: Normal. Negative for fracture or focal lesion. Sinuses/Orbits: No acute finding. Other: None. IMPRESSION: No acute intracranial abnormality. Chronic ischemic changes as above. Electronically Signed   By: Ronney Asters M.D.   On: 02/07/2021 18:48   US Renal  Result Date: 02/07/2021 CLINICAL DATA:  Renal failure EXAM: RENAL / URINARY TRACT ULTRASOUND COMPLETE COMPARISON:  None. FINDINGS: Right Kidney: Renal measurements: 9.0 x 5.2 x 4.8 cm = volume: 115.13 mL. Echogenicity within normal limits. No mass or hydronephrosis visualized. Left Kidney: Renal measurements: 10.5 x 5.1 x 4.8 cm = volume: 134.35 mL. Echogenicity within normal limits. No mass or hydronephrosis visualized. Bladder: Appears normal for degree of bladder distention. Other: None. IMPRESSION: Normal renal sonogram. Electronically Signed   By: Kerby Moors M.D.   On: 02/07/2021 18:14    Pending Labs Unresulted Labs (From admission, onward)     Start     Ordered   02/08/21 1941  Basic metabolic panel  5A & 5P,   R      02/08/21 1034   02/08/21 1031  Urinalysis, Complete w Microscopic  Add-on,   AD        02/08/21 1030   02/07/21 2352  Calcium, ionized  Once,   R        02/07/21 2352   02/07/21 2350  Osmolality  Once,   STAT        02/07/21 2350   02/07/21 2204  Osmolality, urine  Add-on,   AD         02/07/21 2203   02/07/21 2204  TSH  Add-on,   AD        02/07/21  2203   02/07/21 2029  Sodium, urine, random  Add-on,   AD        02/07/21 2028   02/07/21 2029  Creatinine, urine, random  Add-on,   AD        02/07/21 2028            Vitals/Pain Today's Vitals   02/08/21 1247 02/08/21 1248 02/08/21 1517 02/08/21 1646  BP: 120/86  124/73 138/72  Pulse: 77  81 79  Resp: 16  16 16   Temp: (!) 97.3 F (36.3 C)  98.2 F (36.8 C) 98.1 F (36.7 C)  TempSrc: Oral  Oral Oral  SpO2: 99%  100% 96%  PainSc: 0-No pain 0-No pain 8  9     Isolation Precautions No active isolations  Medications Medications  acetaminophen (TYLENOL) tablet 650 mg (has no administration in time range)    Or  acetaminophen (TYLENOL) suppository 650 mg (has no administration in time range)  lactated ringers infusion (0 mLs Intravenous Stopped 02/08/21 0933)  levETIRAcetam (KEPPRA) tablet 1,500 mg (1,500 mg Oral Given 02/08/21 0936)  lamoTRIgine (LAMICTAL) tablet 100 mg (100 mg Oral Given 02/08/21 0935)  aspirin chewable tablet 81 mg (81 mg Oral Given 10/16/75 0340)  folic acid (FOLVITE) tablet 1 mg (1 mg Oral Given 02/08/21 0935)  pantoprazole (PROTONIX) EC tablet 40 mg (has no administration in time range)  QUEtiapine (SEROQUEL) tablet 200 mg (has no administration in time range)  lactated ringers infusion ( Intravenous New Bag/Given 02/08/21 1041)  oxyCODONE (Oxy IR/ROXICODONE) immediate release tablet 10 mg (10 mg Oral Given 02/08/21 1520)  sertraline (ZOLOFT) tablet 100 mg (has no administration in time range)  vitamin B-12 (CYANOCOBALAMIN) tablet 1,000 mcg (1,000 mcg Oral Given 02/08/21 1520)  gabapentin (NEURONTIN) capsule 100 mg (100 mg Oral Given 02/08/21 1520)  heparin injection 5,000 Units (5,000 Units Subcutaneous Given 02/08/21 1521)  sodium chloride 0.9 % bolus 1,000 mL (0 mLs Intravenous Stopped 02/07/21 2052)    Mobility non-ambulatory Low fall risk  Pt has back brace, states that he doesn't  walk  Focused Assessments    R Recommendations: See Admitting Provider Note  Report given to:   Additional Notes: c/o back pain, given prn oxycodone at 1520, has back brace

## 2021-02-08 NOTE — ED Notes (Signed)
Pt resting on stretcher with eyes closed, respirations even and unlabored. Pt has call bell within reach, side rails up x2, lights off, primofit in place. IVF infusing without difficulty. No acute changes noted. Will continue to monitor.

## 2021-02-08 NOTE — ED Notes (Signed)
Pt monitor showing st depressions, ekg is similar to one from last night, md notified, states to continue to monitor.

## 2021-02-08 NOTE — ED Notes (Signed)
Pt states that he had a bm, cleaned pt, changed depends.

## 2021-02-09 DIAGNOSIS — N170 Acute kidney failure with tubular necrosis: Secondary | ICD-10-CM | POA: Diagnosis not present

## 2021-02-09 DIAGNOSIS — R569 Unspecified convulsions: Secondary | ICD-10-CM | POA: Diagnosis not present

## 2021-02-09 DIAGNOSIS — E875 Hyperkalemia: Secondary | ICD-10-CM | POA: Diagnosis not present

## 2021-02-09 LAB — BASIC METABOLIC PANEL
Anion gap: 9 (ref 5–15)
Anion gap: 9 (ref 5–15)
BUN: 63 mg/dL — ABNORMAL HIGH (ref 6–20)
BUN: 53 mg/dL — ABNORMAL HIGH (ref 6–20)
CO2: 18 mmol/L — ABNORMAL LOW (ref 22–32)
CO2: 21 mmol/L — ABNORMAL LOW (ref 22–32)
Calcium: 10 mg/dL (ref 8.9–10.3)
Calcium: 10.2 mg/dL (ref 8.9–10.3)
Chloride: 106 mmol/L (ref 98–111)
Chloride: 103 mmol/L (ref 98–111)
Creatinine, Ser: 2.35 mg/dL — ABNORMAL HIGH (ref 0.61–1.24)
Creatinine, Ser: 1.82 mg/dL — ABNORMAL HIGH (ref 0.61–1.24)
GFR, Estimated: 31 mL/min — ABNORMAL LOW (ref >60)
GFR, Estimated: 43 mL/min — ABNORMAL LOW (ref >60)
Glucose, Bld: 105 mg/dL — ABNORMAL HIGH (ref 70–99)
Glucose, Bld: 109 mg/dL — ABNORMAL HIGH (ref 70–99)
Potassium: 5.5 mmol/L — ABNORMAL HIGH (ref 3.5–5.1)
Potassium: 6.2 mmol/L — ABNORMAL HIGH (ref 3.5–5.1)
Sodium: 133 mmol/L — ABNORMAL LOW (ref 135–145)
Sodium: 133 mmol/L — ABNORMAL LOW (ref 135–145)

## 2021-02-09 LAB — CALCIUM, IONIZED: Calcium, Ionized, Serum: 5.2 mg/dL (ref 4.5–5.6)

## 2021-02-09 MED ORDER — SODIUM BICARBONATE 650 MG PO TABS
650.0000 mg | ORAL_TABLET | Freq: Three times a day (TID) | ORAL | Status: DC
  Administered 2021-02-09 – 2021-02-11 (×9): 650 mg via ORAL
  Filled 2021-02-09 (×9): qty 1

## 2021-02-09 NOTE — Progress Notes (Signed)
PROGRESS NOTE    Keith Dixon  QBV:[id number] DOB: 22-Oct-1960 DOA: 02/07/2021 PCP: Reubin Milan, MD    Brief Narrative:  60 year old gentleman with history of chronic anemia, seizure disorder, GERD presented to the ER with abnormal outpatient labs reflecting acute renal failure.  Recently hospitalized 8/3-9/22 with severe lumbar stenosis, multiple surgical interventions.  Hospital course was complicated by acute renal failure and patient was transiently on hemodialysis.  Thoracolumbar wound exploration on 9/8.  Renal functions improved and creatinine was 1.28 on 9/21 when he was discharged with 4-week course of Bactrim for his thoracolumbar wound.  Also on Lovenox.  Patient himself denies any complaints.  Has poor intake. In the emergency room, hemodynamically stable. Sodium 126, potassium 5.9, bicarb 16, creatinine 6.78 with recent creatinine of 1.2.  Recently stayed in the hospital for more than 50 days with at least 4 spinal surgeries, intubation extubation, tracheostomy and ultimately on room air, hemodialysis and recovery of renal function.  Discharged on prophylactic Bactrim for 6 weeks.   Assessment & Plan:   Principal Problem:   ARF (acute renal failure) (HCC) Active Problems:   Seizure (HCC)   Hyperkalemia   Hypercalcemia   Generalized weakness   GERD (gastroesophageal reflux disease)   Tobacco abuse  Acute renal failure with hyperkalemia: Probably multifactorial.  Patient is on Bactrim, lisinopril, has poor oral intake. Presented with hyperkalemia. Conservatively treated with IV fluids and levels are already improving.  Bicarbonate is 18. Potassium is persistently elevated but is stable.  Urine output is adequate. Continue Ringer lactate 100 mL/h today.  Intake and output monitoring.  Bladder scans.  If he is retaining, will need Foley catheter. Renal ultrasound with no evidence of bowel necrosis. Complete urinalysis was ordered, no clinical significance  now.  Seizure disorder: On Lamictal and Keppra.  Continued.  Smoker: Counseled to quit.  On nicotine patch.  GERD: On PPI.  Surgical wound: Healing.  Seen by neurosurgery.  No further antibiotics are needed.  Work with PT OT, mobilize with lumbar support.  DVT prophylaxis: heparin injection 5,000 Units Start: 02/08/21 1400 SCDs Start: 02/07/21 2023   Code Status: Full code Family Communication: Unable to talk to [place] on the number provided. Made 2 calls today  No voice mail left.  Disposition Plan: Status is: Inpatient  Remains inpatient appropriate because:Inpatient level of care appropriate due to severity of illness  Dispo: The patient is from: SNF              Anticipated d/c is to: SNF              Patient currently is not medically stable to d/c.   Difficult to place patient No         Consultants:  Nephrology, curbside on admission recommended conservative management. Neurosurgery, following  Procedures:  None  Antimicrobials:  None   Subjective: Patient seen and examined.  Today he denies any complaints.  He is poor historian overall.  Denies any nausea vomiting abdominal pain. He did not have any output since last night.  On physical exam looks like he has retained urine, bladder scan shows 500 mL.  Straight cath ordered.  Objective: Vitals:   02/09/21 0400 02/09/21 0414 02/09/21 0737 02/09/21 1156  BP: 106/81  125/74 137/77  Pulse: 87  89 82  Resp: 16  14 12   Temp: 97.9 F (36.6 C)  97.9 F (36.6 C) 98.2 F (36.8 C)  TempSrc:   Axillary Oral  SpO2: 99%  100% 97%  Weight:  92.3 kg    Height:        Intake/Output Summary (Last 24 hours) at 02/09/2021 1204 Last data filed at 02/09/2021 0957 Gross per 24 hour  Intake 2634.19 ml  Output 750 ml  Net 1884.19 ml   Filed Weights   02/08/21 1814 02/09/21 0414  Weight: 92.1 kg 92.3 kg    Examination:  General exam: Appears calm and comfortable .  Frail and debilitated.  Chronically  sick looking.  On room air. Respiratory system: Clear to auscultation. Respiratory effort normal.  No added sounds. Cardiovascular system: S1 & S2 heard, RRR.  Gastrointestinal system: Abdomen is soft.  He has suprapubic tenderness and bladder fullness.   Central nervous system: Alert and oriented.  Generalized weakness both legs. Patient has dry and healed multiple incisions on the back. There is a pinpoint open wound on the thoracic spine which is dry without drainage.    Data Reviewed: I have personally reviewed following labs and imaging studies  CBC: Recent Labs  Lab 02/07/21 1527 02/08/21 0404  WBC 6.9 6.3  NEUTROABS 5.5  --   HGB 8.8* 9.1*  HCT 27.5* 27.8*  MCV 81.8 81.8  PLT 478* 979*   Basic Metabolic Panel: Recent Labs  Lab 02/07/21 1527 02/07/21 2328 02/08/21 0404 02/08/21 1755 02/09/21 0645  NA 126* 129* 132* 131* 133*  K 5.9* 5.6* 5.6* 5.9* 5.5*  CL 96* 100 101 101 106  CO2 16* 17* 18* 20* 18*  GLUCOSE 108* 104* 104* 99 105*  BUN 105* 99* 95* 84* 63*  CREATININE 6.78* 6.12* 5.54* 3.86* 2.35*  CALCIUM 9.6 9.6 9.8 9.9 10.0  MG  --  2.6* 2.6*  --   --   PHOS  --   --  7.0*  --   --    GFR: Estimated Creatinine Clearance: 38.7 mL/min (A) (by C-G formula based on SCr of 2.35 mg/dL (H)). Liver Function Tests: Recent Labs  Lab 02/07/21 1527 02/07/21 2328 02/08/21 0404  AST 13* 12* 13*  ALT 12 13 12   ALKPHOS 89 85 88  BILITOT 0.3 0.5 0.6  PROT 6.9 6.7 6.8  ALBUMIN 2.7* 2.7* 2.8*   No results for input(s): LIPASE, AMYLASE in the last 168 hours. No results for input(s): AMMONIA in the last 168 hours. Coagulation Profile: Recent Labs  Lab 02/08/21 0404  INR 1.2   Cardiac Enzymes: Recent Labs  Lab 02/07/21 2328  CKTOTAL 82   BNP (last 3 results) No results for input(s): PROBNP in the last 8760 hours. HbA1C: No results for input(s): HGBA1C in the last 72 hours. CBG: No results for input(s): GLUCAP in the last 168 hours. Lipid Profile: No  results for input(s): CHOL, HDL, LDLCALC, TRIG, CHOLHDL, LDLDIRECT in the last 72 hours. Thyroid Function Tests: No results for input(s): TSH, T4TOTAL, FREET4, T3FREE, THYROIDAB in the last 72 hours. Anemia Panel: No results for input(s): VITAMINB12, FOLATE, FERRITIN, TIBC, IRON, RETICCTPCT in the last 72 hours. Sepsis Labs: No results for input(s): PROCALCITON, LATICACIDVEN in the last 168 hours.  Recent Results (from the past 240 hour(s))  Resp Panel by RT-PCR (Flu A&B, Covid) Nasopharyngeal Swab     Status: None   Collection Time: 02/07/21  7:53 PM   Specimen: Nasopharyngeal Swab; Nasopharyngeal(NP) swabs in vial transport medium  Result Value Ref Range Status   SARS Coronavirus 2 by RT PCR NEGATIVE NEGATIVE Final    Comment: (NOTE) SARS-CoV-2 target nucleic acids are NOT DETECTED.  The SARS-CoV-2 RNA is generally  detectable in upper respiratory specimens during the acute phase of infection. The lowest concentration of SARS-CoV-2 viral copies this assay can detect is 138 copies/mL. A negative result does not preclude SARS-Cov-2 infection and should not be used as the sole basis for treatment or other patient management decisions. A negative result may occur with  improper specimen collection/handling, submission of specimen other than nasopharyngeal swab, presence of viral mutation(s) within the areas targeted by this assay, and inadequate number of viral copies(<138 copies/mL). A negative result must be combined with clinical observations, patient history, and epidemiological information. The expected result is Negative.  Fact Sheet for Patients:  EntrepreneurPulse.com.au  Fact Sheet for Healthcare Providers:  IncredibleEmployment.be  This test is no t yet approved or cleared by the [place] FDA and  has been authorized for detection and/or diagnosis of SARS-CoV-2 by FDA under an Emergency Use Authorization (EUA). This EUA will remain   in effect (meaning this test can be used) for the duration of the COVID-19 declaration under Section 564(b)(1) of the Act, 21 U.S.C.section 360bbb-3(b)(1), unless the authorization is terminated  or revoked sooner.       Influenza A by PCR NEGATIVE NEGATIVE Final   Influenza B by PCR NEGATIVE NEGATIVE Final    Comment: (NOTE) The Xpert Xpress SARS-CoV-2/FLU/RSV plus assay is intended as an aid in the diagnosis of influenza from Nasopharyngeal swab specimens and should not be used as a sole basis for treatment. Nasal washings and aspirates are unacceptable for Xpert Xpress SARS-CoV-2/FLU/RSV testing.  Fact Sheet for Patients: EntrepreneurPulse.com.au  Fact Sheet for Healthcare Providers: IncredibleEmployment.be  This test is not yet approved or cleared by the [place] FDA and has been authorized for detection and/or diagnosis of SARS-CoV-2 by FDA under an Emergency Use Authorization (EUA). This EUA will remain in effect (meaning this test can be used) for the duration of the COVID-19 declaration under Section 564(b)(1) of the Act, 21 U.S.C. section 360bbb-3(b)(1), unless the authorization is terminated or revoked.  Performed at [facility], [place] [address], [place], [place] [address]          Radiology Studies: DG Chest 1 View  Result Date: 02/07/2021 CLINICAL DATA:  Lethargy, dizziness. EXAM: CHEST  1 VIEW COMPARISON:  Chest x-ray 12/30/2020 . FINDINGS: The lungs are clear. There is no pleural effusion or pneumothorax. The cardiomediastinal silhouette is within normal limits. Right-sided central venous catheter has been removed. Thoracolumbar fusion hardware is present. IMPRESSION: No acute cardiopulmonary process. Electronically Signed   By: Ronney Asters M.D.   On: 02/07/2021 18:24   CT HEAD WO CONTRAST (5MM)  Result Date: 02/07/2021 CLINICAL DATA:  Mental status change. EXAM: CT HEAD WITHOUT CONTRAST TECHNIQUE:  Contiguous axial images were obtained from the base of the skull through the vertex without intravenous contrast. COMPARISON:  MRI head 12/22/2020. FINDINGS: Brain: No evidence of acute infarction, hemorrhage, hydrocephalus, extra-axial collection or mass lesion/mass effect. 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 Vascular: No hyperdense vessel or unexpected calcification. Skull: Normal. Negative for fracture or focal lesion. Sinuses/Orbits: No acute finding. Other: None. IMPRESSION: No acute intracranial abnormality. Chronic ischemic changes as above. Electronically Signed   By: Ronney Asters M.D.   On: 02/07/2021 18:48   US Renal  Result Date: 02/07/2021 CLINICAL DATA:  Renal failure EXAM: RENAL / URINARY TRACT ULTRASOUND COMPLETE COMPARISON:  None. FINDINGS: Right Kidney: Renal measurements: 9.0 x 5.2 x 4.8 cm = volume: 115.13 mL. Echogenicity  within normal limits. No mass or hydronephrosis visualized. Left Kidney: Renal measurements: 10.5 x 5.1 x 4.8 cm = volume: 134.35 mL. Echogenicity within normal limits. No mass or hydronephrosis visualized. Bladder: Appears normal for degree of bladder distention. Other: None. IMPRESSION: Normal renal sonogram. Electronically Signed   By: Kerby Moors M.D.   On: 02/07/2021 18:14        Scheduled Meds:  aspirin  81 mg Oral Daily   folic acid  1 mg Oral Daily   gabapentin  100 mg Oral BID   heparin  5,000 Units Subcutaneous Q8H   lamoTRIgine  100 mg Oral BID   levETIRAcetam  1,500 mg Oral BID   pantoprazole  40 mg Oral QHS   QUEtiapine  200 mg Oral QHS   sertraline  100 mg Oral QPC breakfast   sodium bicarbonate  650 mg Oral TID   cyanocobalamin  1,000 mcg Oral Daily   Continuous Infusions:  lactated ringers 100 mL/hr at 02/09/21 6002     LOS: 2 days    Time spent: 32 minutes    Barb Merino, MD [facility] Pager [phone]

## 2021-02-09 NOTE — Evaluation (Signed)
Physical Therapy Evaluation Patient Details Name: Keith Dixon MRN: [id number] DOB: 08-Apr-1961 Today's Date: 02/09/2021  History of Present Illness  Pt is a 60 y.o. 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 PTA patient was at SNF and per pt (?accuracy) was walking with 2 person assist. Pt currently requires max assist to get to sitting and maintain sitting at side of bed. Unable to don brace or attempt transfer with only one person assist. Will need continued therapy at SNF as he lives alone.  Anticipate patient will benefit from PT to address problems listed below.Will continue to follow acutely to maximize functional mobility independence and safety.          Recommendations for follow up therapy are one component of a multi-disciplinary discharge planning process, led by the attending physician.  Recommendations may be updated based on patient status, additional functional criteria and insurance authorization.  Follow Up Recommendations SNF    Equipment Recommendations  Wheelchair (measurements PT);Wheelchair cushion (measurements PT)    Recommendations for Other Services       Precautions / Restrictions Precautions Precautions: Fall;Back Precaution Booklet Issued: No Precaution Comments: reviewed spinal precautions Required Braces or Orthoses: Spinal Brace Spinal Brace: Thoracolumbosacral orthotic;Applied in sitting position Other Brace: unable to don brace insitting due to pt's posterior lean and need for PT support in sitting      Mobility  Bed Mobility Overal bed mobility: Needs Assistance Bed Mobility: Rolling;Sidelying to Sit;Sit  to Sidelying Rolling: Min assist Sidelying to sit: Max assist     Sit to sidelying: Max assist General bed mobility comments: pt moves slowly and requires cues to maintain back precautions and incr physical assist to move    Transfers                 General transfer comment: unable to gain sitting balance to don brace or even attempt transfer  Ambulation/Gait                Stairs            Wheelchair Mobility    Modified Rankin (Stroke Patients Only)       Balance Overall balance assessment: Needs assistance Sitting-balance support: Bilateral upper extremity supported;Feet supported Sitting balance-Leahy Scale: Zero Sitting balance - Comments: strong posterior bias due to back pain with attempts to flex hips and bring torso forward                                     Pertinent Vitals/Pain Pain Assessment: 0-10 Pain Score: 8  Pain Location: lower back Pain Descriptors / Indicators: Grimacing;Discomfort;Guarding Pain Intervention(s): Limited activity within patient's tolerance;Monitored during session;Repositioned    Home Living Family/patient expects to be discharged to:: Skilled nursing facility                      Prior Function Level of Independence: Needs assistance   Gait / Transfers Assistance Needed: reports working on walking at SNF; reports hallway distances but with 2 person assist           Hand Dominance   Dominant Hand: Right  Extremity/Trunk Assessment   Upper Extremity Assessment Upper Extremity Assessment: Generalized weakness    Lower Extremity Assessment Lower Extremity Assessment: RLE deficits/detail;LLE deficits/detail RLE Deficits / Details: AROM <3/5 due to weakness; hip/knee extension 2+ LLE Deficits / Details: AROM<3/5; hip/knee extension 3-    Cervical / Trunk Assessment Cervical / Trunk Assessment: Other exceptions Cervical / Trunk Exceptions: in sitting, limits hip flexion  due to pain in back and leans posteriorly  Communication   Communication: No difficulties  Cognition Arousal/Alertness: Awake/alert Behavior During Therapy: Flat affect Overall Cognitive Status: Impaired/Different from baseline Area of Impairment: Orientation;Following commands;Awareness;Problem solving                 Orientation Level: Place;Situation (initially thought somewhere other than [facility]; thought came back to hospital because of his back) Current Attention Level: Sustained Memory: Decreased recall of precautions Following Commands: Follows one step commands with increased time Safety/Judgement: Decreased awareness of deficits Awareness: Intellectual Problem Solving: Slow processing;Difficulty sequencing;Requires verbal cues;Requires tactile cues General Comments: as asking him to lean his shoulders forward, he kept scooting his hips further forward toward EOB; resists hip flexion in supine and sitting      General Comments      Exercises     Assessment/Plan    PT Assessment Patient needs continued PT services  PT Problem List Decreased strength;Decreased range of motion;Decreased activity tolerance;Decreased balance;Decreased mobility;Decreased cognition;Decreased knowledge of use of DME;Decreased safety awareness;Decreased knowledge of precautions;Pain       PT Treatment Interventions DME instruction;Gait training;Stair training;Functional mobility training;Therapeutic activities;Therapeutic exercise;Balance training;Neuromuscular re-education;Cognitive remediation;Patient/family education    PT Goals (Current goals can be found in the Care Plan section)  Acute Rehab PT Goals Patient Stated Goal: return to rehab PT Goal Formulation: With patient Time For Goal Achievement: 02/23/21 Potential to Achieve Goals: Good    Frequency Min 2X/week   Barriers to discharge Decreased caregiver support      Co-evaluation               AM-PAC PT "6 Clicks"  Mobility  Outcome Measure Help needed turning from your back to your side while in a flat bed without using bedrails?: A Lot Help needed moving from lying on your back to sitting on the side of a flat bed without using bedrails?: A Lot Help needed moving to and from a bed to a chair (including a wheelchair)?: Total Help needed standing up from a chair using your arms (e.g., wheelchair or bedside chair)?: Total Help needed to walk in hospital room?: Total Help needed climbing 3-5 steps with a railing? : Total 6 Click Score: 8    End of Session   Activity Tolerance: Patient limited by pain Patient left: in bed;with call bell/phone within reach;with bed alarm set Nurse Communication: Mobility status PT Visit Diagnosis: Muscle weakness (generalized) (M62.81);Difficulty in walking, not elsewhere classified (R26.2);Pain Pain - Right/Left:  (midliine) Pain - part of body:  (back)    Time: 2182-8833 PT Time Calculation (min) (ACUTE ONLY): 24 min   Charges:   PT Evaluation $PT Eval Moderate Complexity: 1 Mod PT Treatments $Therapeutic Activity: 8-22 mins         Arby Barrette, PT Pager [phone]   Rexanne Mano 02/09/2021, 11:38 AM

## 2021-02-09 NOTE — Progress Notes (Signed)
   02/09/21 0042  Assess: MEWS Score  Temp 97.9 F (36.6 C)  BP (!) 98/48  Pulse Rate 91  ECG Heart Rate 92  Resp 11  SpO2 97 %  O2 Device Room Air  Assess: MEWS Score  MEWS Temp 0  MEWS Systolic 1  MEWS Pulse 0  MEWS RR 1  MEWS LOC 0  MEWS Score 2  MEWS Score Color Yellow  Assess: if the MEWS score is Yellow or Red  Were vital signs taken at a resting state? Yes  Focused Assessment No change from prior assessment  Early Detection of Sepsis Score *See Row Information* Low  MEWS guidelines implemented *See Row Information* Yes  Treat  MEWS Interventions Escalated (See documentation below)  Pain Scale 0-10  Pain Score 0  Take Vital Signs  Increase Vital Sign Frequency  Yellow: Q 2hr X 2 then Q 4hr X 2, if remains yellow, continue Q 4hrs  Escalate  MEWS: Escalate Yellow: discuss with charge nurse/RN and consider discussing with provider and RRT  Notify: Charge Nurse/RN  Name of Charge Nurse/RN Notified Nikki, RN  Date Charge Nurse/RN Notified 02/09/21  Time Charge Nurse/RN Notified 0048  Notify: Provider  Provider Name/Title D. Olevia Bowens  Date Provider Notified 02/09/21  Time Provider Notified [phone]  Notification Type Page  Notification Reason Change in status (Yellow MEWS)  Provider response Other (Comment) (awaiting response)  Document  Patient Outcome Other (Comment) (Protocol continued)  Progress note created (see row info) Yes

## 2021-02-09 NOTE — Progress Notes (Signed)
Neurosurgery  Patient seen and examined. No acute distress. Flank incision well healed. midline back incision well healed with an area of fibrinous debris at the superior aspect of the incision that appears improved compared with a week ago.  S/p T10 to pelvis reconstruction with recent readmission for worse than the kidney function that is now improving - continue physical therapy - patient will need to follow up in t wo weeks for another wound check

## 2021-02-10 DIAGNOSIS — R569 Unspecified convulsions: Secondary | ICD-10-CM | POA: Diagnosis not present

## 2021-02-10 DIAGNOSIS — N170 Acute kidney failure with tubular necrosis: Secondary | ICD-10-CM | POA: Diagnosis not present

## 2021-02-10 DIAGNOSIS — E875 Hyperkalemia: Secondary | ICD-10-CM | POA: Diagnosis not present

## 2021-02-10 LAB — BASIC METABOLIC PANEL
Anion gap: 9 (ref 5–15)
BUN: 40 mg/dL — ABNORMAL HIGH (ref 6–20)
CO2: 21 mmol/L — ABNORMAL LOW (ref 22–32)
Calcium: 9.9 mg/dL (ref 8.9–10.3)
Chloride: 103 mmol/L (ref 98–111)
Creatinine, Ser: 1.43 mg/dL — ABNORMAL HIGH (ref 0.61–1.24)
GFR, Estimated: 57 mL/min — ABNORMAL LOW (ref >60)
Glucose, Bld: 113 mg/dL — ABNORMAL HIGH (ref 70–99)
Potassium: 5.2 mmol/L — ABNORMAL HIGH (ref 3.5–5.1)
Sodium: 133 mmol/L — ABNORMAL LOW (ref 135–145)

## 2021-02-10 LAB — SARS CORONAVIRUS 2 (TAT 6-24 HRS): SARS Coronavirus 2: NEGATIVE

## 2021-02-10 NOTE — TOC Progression Note (Signed)
Transition of Care [facility]) - Progression Note    Patient Details  Name: OBRIEN HUSKINS MRN: [id number] Date of Birth: [birth year]  [facility] CM/SW [place], [place] Phone Number: 02/10/2021, 1:34 PM  Clinical Narrative:     SW informed pt potentially ready for d/c tomorrow. SW requested COVID test. SW called [facility] [phone]) left VM with admissions. SW also left message with Kalman Drape to inform.

## 2021-02-10 NOTE — Progress Notes (Signed)
PROGRESS NOTE    KURTISS WENCE  ACZ:[id number] DOB: Apr 24, 1961 DOA: 02/07/2021 PCP: Reubin Milan, MD    Brief Narrative:  60 year old gentleman with history of chronic anemia, seizure disorder, GERD presented to the ER with abnormal outpatient labs reflecting acute renal failure.  Recently hospitalized 8/3-9/22 with severe lumbar stenosis, multiple surgical interventions.  Hospital course was complicated by acute renal failure and patient was transiently on hemodialysis.  Thoracolumbar wound exploration on 9/8.  Renal functions improved and creatinine was 1.28 on 9/21 when he was discharged with 4-week course of Bactrim for his thoracolumbar wound.  Also on Lovenox.  Patient himself denies any complaints.  Has poor intake. In the emergency room, hemodynamically stable. Sodium 126, potassium 5.9, bicarb 16, creatinine 6.78 with recent creatinine of 1.2.  Recently stayed in the hospital for more than 50 days with at least 4 spinal surgeries, intubation extubation, tracheostomy and ultimately on room air, hemodialysis and recovery of renal function.  Discharged on prophylactic Bactrim for 6 weeks.   Assessment & Plan:   Principal Problem:   ARF (acute renal failure) (HCC) Active Problems:   Seizure (HCC)   Hyperkalemia   Hypercalcemia   Generalized weakness   GERD (gastroesophageal reflux disease)   Tobacco abuse  Acute renal failure with hyperkalemia: Suspected Bactrim induced ATN. Probably multifactorial.  Patient is on Bactrim, lisinopril, has poor oral intake. Presented with hyperkalemia. Conservatively treated with IV fluids and levels are already improving.   Urine output adequate.  Potassium levels improving with bicarbonate. Decrease IV fluid to 50 mill per hour today.  Intake and output monitoring. Renal ultrasound with no evidence of hydronephrosis.  Seizure disorder: On Lamictal and Keppra.  Continued.  Smoker: Counseled to quit.  On nicotine patch.  GERD: On  PPI.  Surgical wound: Healing.  Seen by neurosurgery.  No further antibiotics are needed.  Work with PT OT, mobilize with lumbar support.  DVT prophylaxis: heparin injection 5,000 Units Start: 02/08/21 1400 SCDs Start: 02/07/21 2023   Code Status: Full code Family Communication: Betsy Pries 10/1. disposition Plan: Status is: Inpatient  Remains inpatient appropriate because:Inpatient level of care appropriate due to severity of illness  Dispo: The patient is from: SNF              Anticipated d/c is to: SNF              Patient currently is not medically stable to d/c.   Difficult to place patient No         Consultants:  Nephrology, curbside on admission recommended conservative management. Neurosurgery, following  Procedures:  None  Antimicrobials:  None   Subjective: Patient seen and examined.  Today no complaints.  He had more than 1100 mL urine overnight.  He expects he can go back to a skilled nursing facility tomorrow.  Objective: Vitals:   02/10/21 0322 02/10/21 0500 02/10/21 0814 02/10/21 1207  BP:   (!) 138/96 135/82  Pulse:   85 87  Resp:   12 14  Temp: 98.1 F (36.7 C)  98.6 F (37 C) 98.6 F (37 C)  TempSrc: Oral  Oral Oral  SpO2:   100% 99%  Weight:  91.8 kg    Height:        Intake/Output Summary (Last 24 hours) at 02/10/2021 1310 Last data filed at 02/10/2021 1300 Gross per 24 hour  Intake 2984.48 ml  Output 2000 ml  Net 984.48 ml   Filed Weights   02/08/21 1814 02/09/21 0414  02/10/21 0500  Weight: 92.1 kg 92.3 kg 91.8 kg    Examination:  General exam: Appears calm and comfortable .  Frail and debilitated.  Chronically sick looking.  On room air. Respiratory system: Clear to auscultation. Respiratory effort normal.  No added sounds. Cardiovascular system: S1 & S2 heard, RRR.  Gastrointestinal system: Abdomen is soft.  Nontender.  No palpable bladder. Central nervous system: Alert and oriented.  Generalized weakness both  legs. Patient has dry and healed multiple incisions on the back. There is a pinpoint open wound on the thoracic spine which is dry without drainage.    Data Reviewed: I have personally reviewed following labs and imaging studies  CBC: Recent Labs  Lab 02/07/21 1527 02/08/21 0404  WBC 6.9 6.3  NEUTROABS 5.5  --   HGB 8.8* 9.1*  HCT 27.5* 27.8*  MCV 81.8 81.8  PLT 478* 093*   Basic Metabolic Panel: Recent Labs  Lab 02/07/21 2328 02/08/21 0404 02/08/21 1755 02/09/21 0645 02/09/21 1624 02/10/21 0539  NA 129* 132* 131* 133* 133* 133*  K 5.6* 5.6* 5.9* 5.5* 6.2* 5.2*  CL 100 101 101 106 103 103  CO2 17* 18* 20* 18* 21* 21*  GLUCOSE 104* 104* 99 105* 109* 113*  BUN 99* 95* 84* 63* 53* 40*  CREATININE 6.12* 5.54* 3.86* 2.35* 1.82* 1.43*  CALCIUM 9.6 9.8 9.9 10.0 10.2 9.9  MG 2.6* 2.6*  --   --   --   --   PHOS  --  7.0*  --   --   --   --    GFR: Estimated Creatinine Clearance: 63.6 mL/min (A) (by C-G formula based on SCr of 1.43 mg/dL (H)). Liver Function Tests: Recent Labs  Lab 02/07/21 1527 02/07/21 2328 02/08/21 0404  AST 13* 12* 13*  ALT 12 13 12   ALKPHOS 89 85 88  BILITOT 0.3 0.5 0.6  PROT 6.9 6.7 6.8  ALBUMIN 2.7* 2.7* 2.8*   No results for input(s): LIPASE, AMYLASE in the last 168 hours. No results for input(s): AMMONIA in the last 168 hours. Coagulation Profile: Recent Labs  Lab 02/08/21 0404  INR 1.2   Cardiac Enzymes: Recent Labs  Lab 02/07/21 2328  CKTOTAL 82   BNP (last 3 results) No results for input(s): PROBNP in the last 8760 hours. HbA1C: No results for input(s): HGBA1C in the last 72 hours. CBG: No results for input(s): GLUCAP in the last 168 hours. Lipid Profile: No results for input(s): CHOL, HDL, LDLCALC, TRIG, CHOLHDL, LDLDIRECT in the last 72 hours. Thyroid Function Tests: No results for input(s): TSH, T4TOTAL, FREET4, T3FREE, THYROIDAB in the last 72 hours. Anemia Panel: No results for input(s): VITAMINB12, FOLATE,  FERRITIN, TIBC, IRON, RETICCTPCT in the last 72 hours. Sepsis Labs: No results for input(s): PROCALCITON, LATICACIDVEN in the last 168 hours.  Recent Results (from the past 240 hour(s))  Resp Panel by RT-PCR (Flu A&B, Covid) Nasopharyngeal Swab     Status: None   Collection Time: 02/07/21  7:53 PM   Specimen: Nasopharyngeal Swab; Nasopharyngeal(NP) swabs in vial transport medium  Result Value Ref Range Status   SARS Coronavirus 2 by RT PCR NEGATIVE NEGATIVE Final    Comment: (NOTE) SARS-CoV-2 target nucleic acids are NOT DETECTED.  The SARS-CoV-2 RNA is generally detectable in upper respiratory specimens during the acute phase of infection. The lowest concentration of SARS-CoV-2 viral copies this assay can detect is 138 copies/mL. A negative result does not preclude SARS-Cov-2 infection and should not be used as  the sole basis for treatment or other patient management decisions. A negative result may occur with  improper specimen collection/handling, submission of specimen other than nasopharyngeal swab, presence of viral mutation(s) within the areas targeted by this assay, and inadequate number of viral copies(<138 copies/mL). A negative result must be combined with clinical observations, patient history, and epidemiological information. The expected result is Negative.  Fact Sheet for Patients:  EntrepreneurPulse.com.au  Fact Sheet for Healthcare Providers:  IncredibleEmployment.be  This test is no t yet approved or cleared by the [place] FDA and  has been authorized for detection and/or diagnosis of SARS-CoV-2 by FDA under an Emergency Use Authorization (EUA). This EUA will remain  in effect (meaning this test can be used) for the duration of the COVID-19 declaration under Section 564(b)(1) of the Act, 21 U.S.C.section 360bbb-3(b)(1), unless the authorization is terminated  or revoked sooner.       Influenza A by PCR NEGATIVE  NEGATIVE Final   Influenza B by PCR NEGATIVE NEGATIVE Final    Comment: (NOTE) The Xpert Xpress SARS-CoV-2/FLU/RSV plus assay is intended as an aid in the diagnosis of influenza from Nasopharyngeal swab specimens and should not be used as a sole basis for treatment. Nasal washings and aspirates are unacceptable for Xpert Xpress SARS-CoV-2/FLU/RSV testing.  Fact Sheet for Patients: EntrepreneurPulse.com.au  Fact Sheet for Healthcare Providers: IncredibleEmployment.be  This test is not yet approved or cleared by the [place] FDA and has been authorized for detection and/or diagnosis of SARS-CoV-2 by FDA under an Emergency Use Authorization (EUA). This EUA will remain in effect (meaning this test can be used) for the duration of the COVID-19 declaration under Section 564(b)(1) of the Act, 21 U.S.C. section 360bbb-3(b)(1), unless the authorization is terminated or revoked.  Performed at [facility], [place] [address], [place], [place] [address]          Radiology Studies: No results found.      Scheduled Meds:  aspirin  81 mg Oral Daily   folic acid  1 mg Oral Daily   gabapentin  100 mg Oral BID   heparin  5,000 Units Subcutaneous Q8H   lamoTRIgine  100 mg Oral BID   levETIRAcetam  1,500 mg Oral BID   pantoprazole  40 mg Oral QHS   QUEtiapine  200 mg Oral QHS   sertraline  100 mg Oral QPC breakfast   sodium bicarbonate  650 mg Oral TID   cyanocobalamin  1,000 mcg Oral Daily   Continuous Infusions:  lactated ringers 50 mL/hr at 02/10/21 0756     LOS: 3 days    Time spent: 32 minutes    Barb Merino, MD [facility] Pager [phone]
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
## 2021-02-11 DIAGNOSIS — N170 Acute kidney failure with tubular necrosis: Principal | ICD-10-CM

## 2021-02-11 LAB — CBC WITH DIFFERENTIAL/PLATELET
Abs Immature Granulocytes: 0.01 10*3/uL (ref 0.00–0.07)
Basophils Absolute: 0 10*3/uL (ref 0.0–0.1)
Basophils Relative: 0 %
Eosinophils Absolute: 0.1 10*3/uL (ref 0.0–0.5)
Eosinophils Relative: 2 %
HCT: 25 % — ABNORMAL LOW (ref 39.0–52.0)
Hemoglobin: 7.9 g/dL — ABNORMAL LOW (ref 13.0–17.0)
Immature Granulocytes: 0 %
Lymphocytes Relative: 20 %
Lymphs Abs: 1.2 10*3/uL (ref 0.7–4.0)
MCH: 26 pg (ref 26.0–34.0)
MCHC: 31.6 g/dL (ref 30.0–36.0)
MCV: 82.2 fL (ref 80.0–100.0)
Monocytes Absolute: 0.4 10*3/uL (ref 0.1–1.0)
Monocytes Relative: 7 %
Neutro Abs: 4.2 10*3/uL (ref 1.7–7.7)
Neutrophils Relative %: 71 %
Platelets: 361 10*3/uL (ref 150–400)
RBC: 3.04 MIL/uL — ABNORMAL LOW (ref 4.22–5.81)
RDW: 16.1 % — ABNORMAL HIGH (ref 11.5–15.5)
WBC: 6 10*3/uL (ref 4.0–10.5)
nRBC: 0 % (ref 0.0–0.2)

## 2021-02-11 LAB — COMPREHENSIVE METABOLIC PANEL
ALT: 14 U/L (ref 0–44)
AST: 13 U/L — ABNORMAL LOW (ref 15–41)
Albumin: 2.6 g/dL — ABNORMAL LOW (ref 3.5–5.0)
Alkaline Phosphatase: 69 U/L (ref 38–126)
Anion gap: 10 (ref 5–15)
BUN: 28 mg/dL — ABNORMAL HIGH (ref 6–20)
CO2: 20 mmol/L — ABNORMAL LOW (ref 22–32)
Calcium: 9.5 mg/dL (ref 8.9–10.3)
Chloride: 101 mmol/L (ref 98–111)
Creatinine, Ser: 1.18 mg/dL (ref 0.61–1.24)
GFR, Estimated: >60 mL/min (ref >60)
Glucose, Bld: 112 mg/dL — ABNORMAL HIGH (ref 70–99)
Potassium: 4.7 mmol/L (ref 3.5–5.1)
Sodium: 131 mmol/L — ABNORMAL LOW (ref 135–145)
Total Bilirubin: 0.3 mg/dL (ref 0.3–1.2)
Total Protein: 6.3 g/dL — ABNORMAL LOW (ref 6.5–8.1)

## 2021-02-11 LAB — MAGNESIUM: Magnesium: 1.4 mg/dL — ABNORMAL LOW (ref 1.7–2.4)

## 2021-02-11 LAB — PHOSPHORUS: Phosphorus: 3.1 mg/dL (ref 2.5–4.6)

## 2021-02-11 MED ORDER — PNEUMOCOCCAL VAC POLYVALENT 25 MCG/0.5ML IJ INJ: 0.5000 mL | INJECTION | INTRAMUSCULAR | Status: DC

## 2021-02-11 MED ORDER — OXYCODONE HCL 10 MG PO TABS: 10.0000 mg | ORAL_TABLET | Freq: Four times a day (QID) | ORAL | 0 refills | Status: AC | PRN

## 2021-02-11 MED ORDER — INFLUENZA VAC SPLIT QUAD 0.5 ML IM SUSY
0.5000 mL | PREFILLED_SYRINGE | INTRAMUSCULAR | Status: AC
  Administered 2021-02-11: 0.5 mL via INTRAMUSCULAR
  Filled 2021-02-11: qty 0.5

## 2021-02-11 MED ORDER — GABAPENTIN 300 MG PO CAPS: 300.0000 mg | ORAL_CAPSULE | Freq: Two times a day (BID) | ORAL | Status: AC

## 2021-02-11 MED ORDER — MAGNESIUM SULFATE 2 GM/50ML IV SOLN
2.0000 g | Freq: Once | INTRAVENOUS | Status: AC
  Administered 2021-02-11: 2 g via INTRAVENOUS
  Filled 2021-02-11: qty 50

## 2021-02-11 MED ORDER — SODIUM BICARBONATE 650 MG PO TABS: 650.0000 mg | ORAL_TABLET | Freq: Three times a day (TID) | ORAL | 0 refills | Status: AC

## 2021-02-11 MED ORDER — MAGNESIUM 400 MG PO TABS: 400.0000 mg | ORAL_TABLET | Freq: Every day | ORAL | 0 refills | Status: AC

## 2021-02-11 NOTE — Care Management Important Message (Signed)
Important Message  Patient Details  Name: Keith Dixon MRN: [id number] Date of Birth: Jun 25, 1960   Medicare Important Message Given:  Yes     Orbie Pyo 02/11/2021, 10:47 AM

## 2021-02-11 NOTE — Progress Notes (Signed)
SBAR provided to [employer], rn at [facility], all questions and concerns addressed, pt to be transported via ptar.

## 2021-02-11 NOTE — Discharge Summary (Signed)
Physician Discharge Summary  Keith Dixon NFA:[id number] DOB: 1960/11/09 DOA: 02/07/2021  PCP: Reubin Milan, MD  Admit date: 02/07/2021 Discharge date: 02/11/2021  Admitted From: Skilled nursing facility Disposition: Skilled nursing facility  Recommendations for Outpatient Follow-up:  Follow up with PCP in 1-2 weeks Please obtain BMP/CBC/magnesium/phosphorus in one week and every week. Schedule follow-up with neurosurgery as previously planned.  Home Health: N/A Equipment/Devices: N/A  Discharge Condition: Stable. CODE STATUS: Full code Diet recommendation: Regular diet.  Low-sodium.  Nutritional supplements.  Plenty of water.  Discharge summary: 60 year old gentleman with history of chronic anemia, seizure disorder and GERD who was recently admitted to the hospital for more than 50 days with multiple neurosurgical procedures, infections and complications from back surgeries, acute renal failure who was transiently on dialysis and taken off was discharged to a skilled rehab on 9/21.  Patient had recheck labs in 1 week that showed acute renal failure with sodium 126, potassium 5.9, bicarb 16 and creatinine 6.78 with recent discharge creatinine of 1.2.  Admitted with acute renal failure.     Assessment & plan of care:  Acute renal failure with hyperkalemia: Suspected Bactrim induced ATN. Probably multifactorial.  Patient was on Bactrim, lisinopril, had poor oral intake. Conservatively treated with IV fluids and oral bicarbonate with resolution of symptoms.  Renal functions improved and normalized.  Urine output is adequate.  Renal ultrasound with no evidence of hydronephrosis. Patient is very prone to get renal failure, avoid all nephrotoxins as much as possible.  Bactrim discontinued.  Seizure disorder: On Lamictal and Keppra.  Continued.  Smoker: Counseled to quit.  On nicotine patch.  We will continue.   GERD: On PPI.   Surgical wound: Healing.  Seen by neurosurgery.  No  further antibiotics are needed.  Work with PT OT, mobilize with lumbar support. Neurosurgery recommends continue use of Lovenox prophylaxis with history of recent DVT on previous hospitalization.  Continue Lovenox until 10/21.  Neurosurgery Using occasional oxycodone for pain relief that he should continue to use along with decrease dose of gabapentin and Flexeril to improve his mobility and work with PT OT.  Hypomagnesemia: Magnesium level is 1.4 today.  He will be given 2 g of IV magnesium today and start on scheduled magnesium oxide 400 mg daily.  Please recheck magnesium and phosphorus in 1 week.  Patient is adequately stabilized and able to go to a skilled nursing rehab today.      Discharge Diagnoses:  Principal Problem:   ARF (acute renal failure) (HCC) Active Problems:   Seizure (HCC)   Hyperkalemia   Hypercalcemia   Generalized weakness   GERD (gastroesophageal reflux disease)   Tobacco abuse    Discharge Instructions  Discharge Instructions     Call MD for:  severe uncontrolled pain   Complete by: As directed    Call MD for:  temperature >100.4   Complete by: As directed    Diet - low sodium heart healthy   Complete by: As directed    Discharge wound care:   Complete by: As directed    Keep back wound clean and dry , can put dry dressing   Increase activity slowly   Complete by: As directed    Other Restrictions   Complete by: As directed    Lumbar brace on mobilizing      Allergies as of 02/11/2021   No Known Allergies      Medication List     STOP taking these medications    bacitracin  ointment       TAKE these medications    acetaminophen 500 MG tablet Commonly known as: TYLENOL Take 2 tablets (1,000 mg total) by mouth every 6 (six) hours as needed for mild pain.   aspirin 81 MG chewable tablet Chew 1 tablet (81 mg total) by mouth daily.   cyanocobalamin 1000 MCG tablet Take 1 tablet (1,000 mcg total) by mouth daily.    cyclobenzaprine 10 MG tablet Commonly known as: FLEXERIL Take 1 tablet (10 mg total) by mouth 3 (three) times daily as needed for muscle spasms. What changed: when to take this   docusate sodium 100 MG capsule Commonly known as: COLACE Take 1 capsule (100 mg total) by mouth 2 (two) times daily as needed for mild constipation. What changed:  when to take this reasons to take this   enoxaparin 40 MG/0.4ML injection Commonly known as: LOVENOX Inject 0.4 mLs (40 mg total) into the skin daily for 28 days.   Ensure Take 237 mLs by mouth 3 (three) times daily. What changed: Another medication with the same name was removed. Continue taking this medication, and follow the directions you see here.   folic acid 1 MG tablet Commonly known as: FOLVITE Take 1 tablet (1 mg total) by mouth daily.   gabapentin 300 MG capsule Commonly known as: NEURONTIN Take 1 capsule (300 mg total) by mouth 2 (two) times daily. What changed: how much to take   hydrOXYzine 50 MG capsule Commonly known as: VISTARIL Take 50 mg by mouth in the morning and at bedtime.   Keppra 750 MG tablet Generic drug: levETIRAcetam Take 1,500 mg by mouth 2 (two) times daily.   LaMICtal 100 MG tablet Generic drug: lamoTRIgine Take 100 mg by mouth 2 (two) times daily. What changed: Another medication with the same name was removed. Continue taking this medication, and follow the directions you see here.   Magnesium 400 MG Tabs Take 400 mg by mouth daily.   Oxycodone HCl 10 MG Tabs Take 1 tablet (10 mg total) by mouth every 6 (six) hours as needed for up to 5 days ((score 4 to 6)). What changed:  when to take this reasons to take this   pantoprazole 40 MG tablet Commonly known as: PROTONIX Take 1 tablet (40 mg total) by mouth at bedtime.   QUEtiapine 200 MG tablet Commonly known as: SEROQUEL Take 200 mg by mouth at bedtime.   sertraline 100 MG tablet Commonly known as: ZOLOFT Take 100 mg by mouth daily  after breakfast.   sodium bicarbonate 650 MG tablet Take 1 tablet (650 mg total) by mouth 3 (three) times daily.   Vitamin D3 50 MCG (2000 UT) Tabs Take 2,000 Units by mouth in the morning and at bedtime.   zinc sulfate 220 (50 Zn) MG capsule Take 1 capsule (220 mg total) by mouth daily.               Discharge Care Instructions  (From admission, onward)           Start     Ordered   02/11/21 0000  Discharge wound care:       Comments: Keep back wound clean and dry , can put dry dressing   02/11/21 0746            No Known Allergies  Consultations: Neurosurgery   Procedures/Studies: DG Chest 1 View  Result Date: 02/07/2021 CLINICAL DATA:  Lethargy, dizziness. EXAM: CHEST  1 VIEW COMPARISON:  Chest x-ray 12/30/2020 .  FINDINGS: The lungs are clear. There is no pleural effusion or pneumothorax. The cardiomediastinal silhouette is within normal limits. Right-sided central venous catheter has been removed. Thoracolumbar fusion hardware is present. IMPRESSION: No acute cardiopulmonary process. Electronically Signed   By: Ronney Asters M.D.   On: 02/07/2021 18:24   CT HEAD WO CONTRAST (5MM)  Result Date: 02/07/2021 CLINICAL DATA:  Mental status change. EXAM: CT HEAD WITHOUT CONTRAST TECHNIQUE: Contiguous axial images were obtained from the base of the skull through the vertex without intravenous contrast. COMPARISON:  MRI head 12/22/2020. FINDINGS: Brain: No evidence of acute infarction, hemorrhage, hydrocephalus, extra-axial collection or mass lesion/mass effect. 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 Vascular: No hyperdense vessel or unexpected calcification. Skull: Normal. Negative for fracture or focal lesion. Sinuses/Orbits: No acute finding. Other: None. IMPRESSION: No acute intracranial abnormality. Chronic ischemic changes as above. Electronically Signed   By: Ronney Asters M.D.   On: 02/07/2021 18:48   CT  THORACIC SPINE WO CONTRAST  Result Date: 01/16/2021 CLINICAL DATA:  Status post thoracolumbar fusion. EXAM: CT THORACIC AND LUMBAR SPINE WITHOUT CONTRAST TECHNIQUE: Multidetector CT imaging of the thoracic and lumbar spine was performed without contrast. Multiplanar CT image reconstructions were also generated. COMPARISON:  MRI of the thoracic spine 09/13/2020, MRI of the lumbar spine 12/04/2020, thoracolumbar spine radiographs 11/16/2020 FINDINGS: CT THORACIC SPINE FINDINGS Status post posterior fusion T10-bi-iliac, with interbody disc spacers at L1-S1 and posterior decompression L2-L4. Alignment: S shaped curvature of the thoracic spine. No antero or retrolisthesis. Vertebrae: No acute fracture or focal pathologic process. Multilevel vertebral body height loss, which does not appear acute. Paraspinal and other soft tissues: Air and fluid in the soft tissues of the back, not unexpected post surgically. Right basilar atelectasis. Disc levels: Multilevel degenerative changes with disc height loss. No high-grade spinal canal stenosis or neural foraminal narrowing. CT LUMBAR SPINE FINDINGS Segmentation: Lumbarization of S1. The last complete disc space is at S1-S2. Alignment: Levocurvature of the lumbar spine. No significant listhesis. Vertebrae: No acute fracture. Multilevel disc height loss with endplate degenerative changes, which are most severe at L2-L3 and L3-L4, where there is significant right lateral wedging. Paraspinal and other soft tissues: Fluid and air in the soft tissues of the back, with high-density material, likely postsurgical. Disc levels: Evaluation is limited by beam hardening artifact. Within this limitation, no high-grade spinal canal stenosis. There is moderate to severe neural foraminal narrowing on the right at L1-L2 and L2-L3 and on the left at T8-T9, primarily due to spinal curvature. IMPRESSION:: IMPRESSION: 1. Status post T10-bi-iliac posterior fusion, with interbody disc spacers at  L1-S1, and posterior decompression L2-L4, without evidence of hardware failure. 2. Fluid and gas in the posterior paravertebral soft tissues, not unexpected postoperatively. Electronically Signed   By: Merilyn Baba M.D.   On: 01/16/2021 20:05   CT LUMBAR SPINE WO CONTRAST  Result Date: 01/16/2021 CLINICAL DATA:  Status post thoracolumbar fusion. EXAM: CT THORACIC AND LUMBAR SPINE WITHOUT CONTRAST TECHNIQUE: Multidetector CT imaging of the thoracic and lumbar spine was performed without contrast. Multiplanar CT image reconstructions were also generated. COMPARISON:  MRI of the thoracic spine 09/13/2020, MRI of the lumbar spine 12/04/2020, thoracolumbar spine radiographs 11/16/2020 FINDINGS: CT THORACIC SPINE FINDINGS Status post posterior fusion T10-bi-iliac, with interbody disc spacers at L1-S1 and posterior decompression L2-L4. Alignment: S shaped curvature of the thoracic spine. No antero or retrolisthesis. Vertebrae: No acute fracture or focal pathologic process. Multilevel vertebral body  height loss, which does not appear acute. Paraspinal and other soft tissues: Air and fluid in the soft tissues of the back, not unexpected post surgically. Right basilar atelectasis. Disc levels: Multilevel degenerative changes with disc height loss. No high-grade spinal canal stenosis or neural foraminal narrowing. CT LUMBAR SPINE FINDINGS Segmentation: Lumbarization of S1. The last complete disc space is at S1-S2. Alignment: Levocurvature of the lumbar spine. No significant listhesis. Vertebrae: No acute fracture. Multilevel disc height loss with endplate degenerative changes, which are most severe at L2-L3 and L3-L4, where there is significant right lateral wedging. Paraspinal and other soft tissues: Fluid and air in the soft tissues of the back, with high-density material, likely postsurgical. Disc levels: Evaluation is limited by beam hardening artifact. Within this limitation, no high-grade spinal canal stenosis. There  is moderate to severe neural foraminal narrowing on the right at L1-L2 and L2-L3 and on the left at T8-T9, primarily due to spinal curvature. IMPRESSION:: IMPRESSION: 1. Status post T10-bi-iliac posterior fusion, with interbody disc spacers at L1-S1, and posterior decompression L2-L4, without evidence of hardware failure. 2. Fluid and gas in the posterior paravertebral soft tissues, not unexpected postoperatively. Electronically Signed   By: Merilyn Baba M.D.   On: 01/16/2021 20:05   US Renal  Result Date: 02/07/2021 CLINICAL DATA:  Renal failure EXAM: RENAL / URINARY TRACT ULTRASOUND COMPLETE COMPARISON:  None. FINDINGS: Right Kidney: Renal measurements: 9.0 x 5.2 x 4.8 cm = volume: 115.13 mL. Echogenicity within normal limits. No mass or hydronephrosis visualized. Left Kidney: Renal measurements: 10.5 x 5.1 x 4.8 cm = volume: 134.35 mL. Echogenicity within normal limits. No mass or hydronephrosis visualized. Bladder: Appears normal for degree of bladder distention. Other: None. IMPRESSION: Normal renal sonogram. Electronically Signed   By: Kerby Moors M.D.   On: 02/07/2021 18:14   (Echo, Carotid, EGD, Colonoscopy, ERCP)    Subjective: Patient seen and examined.  No overnight events.  No difficulty urinating.  Discussed about going to rehab today and he is looking forward to get more rehab.  Denies any obvious back pain until he is mobilized.   Discharge Exam: Vitals:   02/11/21 0448 02/11/21 0817  BP:  (!) 142/83  Pulse: 87 90  Resp: 19 14  Temp:  98.1 F (36.7 C)  SpO2: 98% 99%   Vitals:   02/11/21 0000 02/11/21 0400 02/11/21 0448 02/11/21 0817  BP: 116/71 125/77  (!) 142/83  Pulse: 92 87 87 90  Resp: 20 15 19 14   Temp: 97.7 F (36.5 C) 98.3 F (36.8 C)  98.1 F (36.7 C)  TempSrc: Oral Oral  Oral  SpO2: 94% 98% 98% 99%  Weight:   89.8 kg   Height:        General: Pt is alert, awake, not in acute distress Frail and debilitated but not in any distress.  Looks  comfortable. Cardiovascular: RRR, S1/S2 +, no rubs, no gallops Respiratory: CTA bilaterally, no wheezing, no rhonchi Abdominal: Soft, NT, ND, bowel sounds + Extremities: no edema, no cyanosis Patient has a small pinpoint opening on his back with some fibrinous discharge.  Nontender.  He has extensive surgical scars that are nontender.  He has diffuse weakness of the both legs but no focal deficits.    The results of significant diagnostics from this hospitalization (including imaging, microbiology, ancillary and laboratory) are listed below for reference.     Microbiology: Recent Results (from the past 240 hour(s))  Resp Panel by RT-PCR (Flu A&B, Covid) Nasopharyngeal Swab  Status: None   Collection Time: 02/07/21  7:53 PM   Specimen: Nasopharyngeal Swab; Nasopharyngeal(NP) swabs in vial transport medium  Result Value Ref Range Status   SARS Coronavirus 2 by RT PCR NEGATIVE NEGATIVE Final    Comment: (NOTE) SARS-CoV-2 target nucleic acids are NOT DETECTED.  The SARS-CoV-2 RNA is generally detectable in upper respiratory specimens during the acute phase of infection. The lowest concentration of SARS-CoV-2 viral copies this assay can detect is 138 copies/mL. A negative result does not preclude SARS-Cov-2 infection and should not be used as the sole basis for treatment or other patient management decisions. A negative result may occur with  improper specimen collection/handling, submission of specimen other than nasopharyngeal swab, presence of viral mutation(s) within the areas targeted by this assay, and inadequate number of viral copies(<138 copies/mL). A negative result must be combined with clinical observations, patient history, and epidemiological information. The expected result is Negative.  Fact Sheet for Patients:  EntrepreneurPulse.com.au  Fact Sheet for Healthcare Providers:  IncredibleEmployment.be  This test is no t yet  approved or cleared by the [place] FDA and  has been authorized for detection and/or diagnosis of SARS-CoV-2 by FDA under an Emergency Use Authorization (EUA). This EUA will remain  in effect (meaning this test can be used) for the duration of the COVID-19 declaration under Section 564(b)(1) of the Act, 21 U.S.C.section 360bbb-3(b)(1), unless the authorization is terminated  or revoked sooner.       Influenza A by PCR NEGATIVE NEGATIVE Final   Influenza B by PCR NEGATIVE NEGATIVE Final    Comment: (NOTE) The Xpert Xpress SARS-CoV-2/FLU/RSV plus assay is intended as an aid in the diagnosis of influenza from Nasopharyngeal swab specimens and should not be used as a sole basis for treatment. Nasal washings and aspirates are unacceptable for Xpert Xpress SARS-CoV-2/FLU/RSV testing.  Fact Sheet for Patients: EntrepreneurPulse.com.au  Fact Sheet for Healthcare Providers: IncredibleEmployment.be  This test is not yet approved or cleared by the [place] FDA and has been authorized for detection and/or diagnosis of SARS-CoV-2 by FDA under an Emergency Use Authorization (EUA). This EUA will remain in effect (meaning this test can be used) for the duration of the COVID-19 declaration under Section 564(b)(1) of the Act, 21 U.S.C. section 360bbb-3(b)(1), unless the authorization is terminated or revoked.  Performed at [facility], [place] [address], [place], [place] [address]   SARS CORONAVIRUS 2 (TAT 6-24 HRS) Nasopharyngeal Nasopharyngeal Swab     Status: None   Collection Time: 02/10/21  1:23 PM   Specimen: Nasopharyngeal Swab  Result Value Ref Range Status   SARS Coronavirus 2 NEGATIVE NEGATIVE Final    Comment: (NOTE) SARS-CoV-2 target nucleic acids are NOT DETECTED.  The SARS-CoV-2 RNA is generally detectable in upper and lower respiratory specimens during the acute phase of infection. Negative results do not preclude SARS-CoV-2  infection, do not rule out co-infections with other pathogens, and should not be used as the sole basis for treatment or other patient management decisions. Negative results must be combined with clinical observations, patient history, and epidemiological information. The expected result is Negative.  Fact Sheet for Patients: SugarRoll.be  Fact Sheet for Healthcare Providers: https://www.woods-mathews.com/  This test is not yet approved or cleared by the [place] FDA and  has been authorized for detection and/or diagnosis of SARS-CoV-2 by FDA under an Emergency Use Authorization (EUA). This EUA will remain  in effect (meaning this test can be used) for the duration of the  COVID-19 declaration under Se ction 564(b)(1) of the Act, 21 U.S.C. section 360bbb-3(b)(1), unless the authorization is terminated or revoked sooner.  Performed at [facility], [place] [address] [place], [place] [address]      Labs: BNP (last 3 results) No results for input(s): BNP in the last 8760 hours. Basic Metabolic Panel: Recent Labs  Lab 02/07/21 2328 02/08/21 0404 02/08/21 1755 02/09/21 0645 02/09/21 1624 02/10/21 0539 02/11/21 0043  NA 129* 132* 131* 133* 133* 133* 131*  K 5.6* 5.6* 5.9* 5.5* 6.2* 5.2* 4.7  CL 100 101 101 106 103 103 101  CO2 17* 18* 20* 18* 21* 21* 20*  GLUCOSE 104* 104* 99 105* 109* 113* 112*  BUN 99* 95* 84* 63* 53* 40* 28*  CREATININE 6.12* 5.54* 3.86* 2.35* 1.82* 1.43* 1.18  CALCIUM 9.6 9.8 9.9 10.0 10.2 9.9 9.5  MG 2.6* 2.6*  --   --   --   --  1.4*  PHOS  --  7.0*  --   --   --   --  3.1   Liver Function Tests: Recent Labs  Lab 02/07/21 1527 02/07/21 2328 02/08/21 0404 02/11/21 0043  AST 13* 12* 13* 13*  ALT 12 13 12 14   ALKPHOS 89 85 88 69  BILITOT 0.3 0.5 0.6 0.3  PROT 6.9 6.7 6.8 6.3*  ALBUMIN 2.7* 2.7* 2.8* 2.6*   No results for input(s): LIPASE, AMYLASE in the last 168 hours. No results for  input(s): AMMONIA in the last 168 hours. CBC: Recent Labs  Lab 02/07/21 1527 02/08/21 0404 02/11/21 0043  WBC 6.9 6.3 6.0  NEUTROABS 5.5  --  4.2  HGB 8.8* 9.1* 7.9*  HCT 27.5* 27.8* 25.0*  MCV 81.8 81.8 82.2  PLT 478* 451* 361   Cardiac Enzymes: Recent Labs  Lab 02/07/21 2328  CKTOTAL 82   BNP: Invalid input(s): POCBNP CBG: No results for input(s): GLUCAP in the last 168 hours. D-Dimer No results for input(s): DDIMER in the last 72 hours. Hgb A1c No results for input(s): HGBA1C in the last 72 hours. Lipid Profile No results for input(s): CHOL, HDL, LDLCALC, TRIG, CHOLHDL, LDLDIRECT in the last 72 hours. Thyroid function studies No results for input(s): TSH, T4TOTAL, T3FREE, THYROIDAB in the last 72 hours.  Invalid input(s): FREET3 Anemia work up No results for input(s): VITAMINB12, FOLATE, FERRITIN, TIBC, IRON, RETICCTPCT in the last 72 hours. Urinalysis    Component Value Date/Time   COLORURINE YELLOW 02/07/2021 1528   APPEARANCEUR CLEAR 02/07/2021 1528   LABSPEC 1.011 02/07/2021 1528   PHURINE 5.0 02/07/2021 1528   GLUCOSEU NEGATIVE 02/07/2021 [place] 02/07/2021 [place] 02/07/2021 1528   BILIRUBINUR neg 11/06/2010 1037   KETONESUR NEGATIVE 02/07/2021 1528   PROTEINUR NEGATIVE 02/07/2021 1528   UROBILINOGEN 0.2 11/27/2013 1333   NITRITE NEGATIVE 02/07/2021 1528   LEUKOCYTESUR NEGATIVE 02/07/2021 1528   Sepsis Labs Invalid input(s): PROCALCITONIN,  WBC,  LACTICIDVEN Microbiology Recent Results (from the past 240 hour(s))  Resp Panel by RT-PCR (Flu A&B, Covid) Nasopharyngeal Swab     Status: None   Collection Time: 02/07/21  7:53 PM   Specimen: Nasopharyngeal Swab; Nasopharyngeal(NP) swabs in vial transport medium  Result Value Ref Range Status   SARS Coronavirus 2 by RT PCR NEGATIVE NEGATIVE Final    Comment: (NOTE) SARS-CoV-2 target nucleic acids are NOT DETECTED.  The SARS-CoV-2 RNA is generally detectable in upper  respiratory specimens during the acute phase of infection. The lowest  concentration of SARS-CoV-2 viral copies this assay can detect is 138 copies/mL. A negative result does not preclude SARS-Cov-2 infection and should not be used as the sole basis for treatment or other patient management decisions. A negative result may occur with  improper specimen collection/handling, submission of specimen other than nasopharyngeal swab, presence of viral mutation(s) within the areas targeted by this assay, and inadequate number of viral copies(<138 copies/mL). A negative result must be combined with clinical observations, patient history, and epidemiological information. The expected result is Negative.  Fact Sheet for Patients:  EntrepreneurPulse.com.au  Fact Sheet for Healthcare Providers:  IncredibleEmployment.be  This test is no t yet approved or cleared by the [place] FDA and  has been authorized for detection and/or diagnosis of SARS-CoV-2 by FDA under an Emergency Use Authorization (EUA). This EUA will remain  in effect (meaning this test can be used) for the duration of the COVID-19 declaration under Section 564(b)(1) of the Act, 21 U.S.C.section 360bbb-3(b)(1), unless the authorization is terminated  or revoked sooner.       Influenza A by PCR NEGATIVE NEGATIVE Final   Influenza B by PCR NEGATIVE NEGATIVE Final    Comment: (NOTE) The Xpert Xpress SARS-CoV-2/FLU/RSV plus assay is intended as an aid in the diagnosis of influenza from Nasopharyngeal swab specimens and should not be used as a sole basis for treatment. Nasal washings and aspirates are unacceptable for Xpert Xpress SARS-CoV-2/FLU/RSV testing.  Fact Sheet for Patients: EntrepreneurPulse.com.au  Fact Sheet for Healthcare Providers: IncredibleEmployment.be  This test is not yet approved or cleared by the [place] FDA and has been  authorized for detection and/or diagnosis of SARS-CoV-2 by FDA under an Emergency Use Authorization (EUA). This EUA will remain in effect (meaning this test can be used) for the duration of the COVID-19 declaration under Section 564(b)(1) of the Act, 21 U.S.C. section 360bbb-3(b)(1), unless the authorization is terminated or revoked.  Performed at [facility], [place] [address] [place], [place] [address]   SARS CORONAVIRUS 2 (TAT 6-24 HRS) Nasopharyngeal Nasopharyngeal Swab     Status: None   Collection Time: 02/10/21  1:23 PM   Specimen: Nasopharyngeal Swab  Result Value Ref Range Status   SARS Coronavirus 2 NEGATIVE NEGATIVE Final    Comment: (NOTE) SARS-CoV-2 target nucleic acids are NOT DETECTED.  The SARS-CoV-2 RNA is generally detectable in upper and lower respiratory specimens during the acute phase of infection. Negative results do not preclude SARS-CoV-2 infection, do not rule out co-infections with other pathogens, and should not be used as the sole basis for treatment or other patient management decisions. Negative results must be combined with clinical observations, patient history, and epidemiological information. The expected result is Negative.  Fact Sheet for Patients: SugarRoll.be  Fact Sheet for Healthcare Providers: https://www.woods-mathews.com/  This test is not yet approved or cleared by the [place] FDA and  has been authorized for detection and/or diagnosis of SARS-CoV-2 by FDA under an Emergency Use Authorization (EUA). This EUA will remain  in effect (meaning this test can be used) for the duration of the COVID-19 declaration under Se ction 564(b)(1) of the Act, 21 U.S.C. section 360bbb-3(b)(1), unless the authorization is terminated or revoked sooner.  Performed at [facility], [place] [address], [place],  [address]      Time coordinating discharge:  35 minutes  SIGNED:   Barb Merino, MD  [facility] 02/11/2021, 11:11 AM

## 2021-02-11 NOTE — TOC Progression Note (Signed)
Transition of Care [facility]) - Progression Note    Patient Details  Name: Keith Dixon MRN: [id number] Date of Birth: February 10, 1961  Transition of Care [facility]) CM/SW [place], LCSW Phone Number: 02/11/2021, 12:39 PM  Clinical Narrative:    CSW spoke with patient at bedside. He is in agreement with discharging back to [facility] via [place]. CSW offered to contact family for patient but he declined, stated his girlfriend would be visiting later.    Expected Discharge Plan: [place] Barriers to Discharge: No Barriers Identified  Expected Discharge Plan and Services Expected Discharge Plan: [place] In-house Referral: Clinical Social Work   Post Acute Care Choice: [place] Living arrangements for the past 2 months: [place], [place] Expected Discharge Date: 02/11/21                                     Social Determinants of Health (SDOH) Interventions    Readmission Risk Interventions Readmission Risk Prevention Plan 02/11/2021  Transportation Screening Complete  Medication Review Press photographer) Complete  PCP or Specialist appointment within 3-5 days of discharge Complete  HRI or Home Care Consult Complete  SW Recovery Care/Counseling Consult Complete  Palliative Care Screening Not [place] Complete  Some recent data might be hidden

## 2021-02-11 NOTE — TOC Transition Note (Signed)
Transition of Care [facility]) - CM/SW Discharge Note   Patient Details  Name: Keith Dixon MRN: [id number] Date of Birth: May 01, 1961  Transition of Care [facility]) CM/SW Contact:  Benard Halsted, LCSW Phone Number: 02/11/2021, 12:41 PM   Clinical Narrative:    Patient will DC to: [place] Anticipated DC date: 02/11/21 Family notified: Patient declined Transport by: Corey Harold    Per MD patient ready for DC to [facility]. RN to call report prior to discharge [phone] Room 123b). RN, patient, patient's family, and facility notified of DC. Discharge Summary and FL2 sent to facility. DC packet on chart. Ambulance transport requested for patient.   CSW will sign off for now as social work intervention is no longer needed. Please consult [place] again if new needs arise.     Final next level of care: Skilled Nursing Facility Barriers to Discharge: No Barriers Identified   Patient Goals and CMS Choice Patient states their goals for this hospitalization and ongoing recovery are:: Return to SNF CMS Medicare.gov Compare Post Acute Care list provided to:: Patient Choice offered to / list presented to : Patient  Discharge Placement   Existing PASRR number confirmed : 02/11/21          Patient chooses bed at: [facility] Patient to be transferred to facility by: [place] Name of family member notified: Pt declined Patient and family notified of of transfer: 02/11/21  Discharge Plan and Services In-house Referral: Clinical Social Work   Post Acute Care Choice: [place]                               Social Determinants of Health (SDOH) Interventions     Readmission Risk Interventions Readmission Risk Prevention Plan 02/11/2021  Transportation Screening Complete  Medication Review Press photographer) Complete  PCP or Specialist appointment within 3-5 days of discharge Complete  HRI or Home Care Consult Complete  SW Recovery Care/Counseling Consult Complete   [place] Complete  Some recent data might be hidden

## 2021-02-11 NOTE — Progress Notes (Signed)
Pt transported via [facility] wearing spine brace and also has all other belongings. Pt calling girlfriend to inform of transport back to facility.

## 2021-02-25 ENCOUNTER — Other Ambulatory Visit: Payer: Self-pay

## 2021-02-25 ENCOUNTER — Inpatient Hospital Stay (HOSPITAL_COMMUNITY)
Admission: EM | Admit: 2021-02-25 | Discharge: 2021-03-08 | DRG: 857 | Disposition: A | Discharge status: Skilled Nursing Facility | Payer: No Typology Code available for payment source | Source: Ambulatory Visit | Attending: Neurosurgery | Admitting: Neurosurgery

## 2021-02-25 DIAGNOSIS — Z803 Family history of malignant neoplasm of breast: Secondary | ICD-10-CM

## 2021-02-25 DIAGNOSIS — Z8673 Personal history of transient ischemic attack (TIA), and cerebral infarction without residual deficits: Secondary | ICD-10-CM

## 2021-02-25 DIAGNOSIS — Z6830 Body mass index (BMI) 30.0-30.9, adult: Secondary | ICD-10-CM

## 2021-02-25 DIAGNOSIS — F3130 Bipolar disorder, current episode depressed, mild or moderate severity, unspecified: Secondary | ICD-10-CM | POA: Diagnosis present

## 2021-02-25 DIAGNOSIS — L89620 Pressure ulcer of left heel, unstageable: Secondary | ICD-10-CM | POA: Diagnosis present

## 2021-02-25 DIAGNOSIS — Z20822 Contact with and (suspected) exposure to covid-19: Secondary | ICD-10-CM | POA: Diagnosis present

## 2021-02-25 DIAGNOSIS — Z8249 Family history of ischemic heart disease and other diseases of the circulatory system: Secondary | ICD-10-CM

## 2021-02-25 DIAGNOSIS — K219 Gastro-esophageal reflux disease without esophagitis: Secondary | ICD-10-CM | POA: Diagnosis present

## 2021-02-25 DIAGNOSIS — Z79899 Other long term (current) drug therapy: Secondary | ICD-10-CM

## 2021-02-25 DIAGNOSIS — T8142XA Infection following a procedure, deep incisional surgical site, initial encounter: Secondary | ICD-10-CM

## 2021-02-25 DIAGNOSIS — B961 Klebsiella pneumoniae [K. pneumoniae] as the cause of diseases classified elsewhere: Secondary | ICD-10-CM | POA: Diagnosis present

## 2021-02-25 DIAGNOSIS — F419 Anxiety disorder, unspecified: Secondary | ICD-10-CM | POA: Diagnosis present

## 2021-02-25 DIAGNOSIS — T8131XA Disruption of external operation (surgical) wound, not elsewhere classified, initial encounter: Secondary | ICD-10-CM

## 2021-02-25 DIAGNOSIS — Z981 Arthrodesis status: Secondary | ICD-10-CM

## 2021-02-25 DIAGNOSIS — N182 Chronic kidney disease, stage 2 (mild): Secondary | ICD-10-CM | POA: Diagnosis present

## 2021-02-25 DIAGNOSIS — I129 Hypertensive chronic kidney disease with stage 1 through stage 4 chronic kidney disease, or unspecified chronic kidney disease: Secondary | ICD-10-CM | POA: Diagnosis present

## 2021-02-25 DIAGNOSIS — E669 Obesity, unspecified: Secondary | ICD-10-CM | POA: Diagnosis present

## 2021-02-25 DIAGNOSIS — F17210 Nicotine dependence, cigarettes, uncomplicated: Secondary | ICD-10-CM | POA: Diagnosis present

## 2021-02-25 DIAGNOSIS — Z7982 Long term (current) use of aspirin: Secondary | ICD-10-CM

## 2021-02-25 DIAGNOSIS — I444 Left anterior fascicular block: Secondary | ICD-10-CM | POA: Diagnosis present

## 2021-02-25 DIAGNOSIS — G4730 Sleep apnea, unspecified: Secondary | ICD-10-CM | POA: Diagnosis present

## 2021-02-25 DIAGNOSIS — F4310 Post-traumatic stress disorder, unspecified: Secondary | ICD-10-CM | POA: Diagnosis present

## 2021-02-25 DIAGNOSIS — T8140XA Infection following a procedure, unspecified, initial encounter: Secondary | ICD-10-CM | POA: Diagnosis not present

## 2021-02-25 DIAGNOSIS — T8130XA Disruption of wound, unspecified, initial encounter: Secondary | ICD-10-CM | POA: Diagnosis present

## 2021-02-25 HISTORY — DX: Cerebral infarction, unspecified: I63.9

## 2021-02-25 LAB — COMPREHENSIVE METABOLIC PANEL
ALT: 16 U/L (ref 0–44)
AST: 17 U/L (ref 15–41)
Albumin: 2.8 g/dL — ABNORMAL LOW (ref 3.5–5.0)
Alkaline Phosphatase: 92 U/L (ref 38–126)
Anion gap: 8 (ref 5–15)
BUN: 20 mg/dL (ref 6–20)
CO2: 24 mmol/L (ref 22–32)
Calcium: 9.3 mg/dL (ref 8.9–10.3)
Chloride: 105 mmol/L (ref 98–111)
Creatinine, Ser: 1.01 mg/dL (ref 0.61–1.24)
GFR, Estimated: >60 mL/min (ref >60)
Glucose, Bld: 95 mg/dL (ref 70–99)
Potassium: 3.9 mmol/L (ref 3.5–5.1)
Sodium: 137 mmol/L (ref 135–145)
Total Bilirubin: <0.1 mg/dL — ABNORMAL LOW (ref 0.3–1.2)
Total Protein: 6.4 g/dL — ABNORMAL LOW (ref 6.5–8.1)

## 2021-02-25 LAB — CBC WITH DIFFERENTIAL/PLATELET
Abs Immature Granulocytes: 0.01 10*3/uL (ref 0.00–0.07)
Basophils Absolute: 0 10*3/uL (ref 0.0–0.1)
Basophils Relative: 0 %
Eosinophils Absolute: 0.1 10*3/uL (ref 0.0–0.5)
Eosinophils Relative: 1 %
HCT: 29.3 % — ABNORMAL LOW (ref 39.0–52.0)
Hemoglobin: 9.2 g/dL — ABNORMAL LOW (ref 13.0–17.0)
Immature Granulocytes: 0 %
Lymphocytes Relative: 25 %
Lymphs Abs: 1.5 10*3/uL (ref 0.7–4.0)
MCH: 26.1 pg (ref 26.0–34.0)
MCHC: 31.4 g/dL (ref 30.0–36.0)
MCV: 83 fL (ref 80.0–100.0)
Monocytes Absolute: 0.4 10*3/uL (ref 0.1–1.0)
Monocytes Relative: 7 %
Neutro Abs: 3.9 10*3/uL (ref 1.7–7.7)
Neutrophils Relative %: 67 %
Platelets: 336 10*3/uL (ref 150–400)
RBC: 3.53 MIL/uL — ABNORMAL LOW (ref 4.22–5.81)
RDW: 17.2 % — ABNORMAL HIGH (ref 11.5–15.5)
WBC: 5.9 10*3/uL (ref 4.0–10.5)
nRBC: 0 % (ref 0.0–0.2)

## 2021-02-25 LAB — CBG MONITORING, ED: Glucose-Capillary: 109 mg/dL — ABNORMAL HIGH (ref 70–99)

## 2021-02-25 LAB — PROCALCITONIN: Procalcitonin: <0.1 ng/mL

## 2021-02-25 LAB — SEDIMENTATION RATE: Sed Rate: 35 mm/hr — ABNORMAL HIGH (ref 0–16)

## 2021-02-25 NOTE — ED Triage Notes (Signed)
Pt from [facility] for possible spinal infection, recent infusion done, sent here by request of neurologist.

## 2021-02-25 NOTE — ED Notes (Signed)
Patient transported to MRI 

## 2021-02-25 NOTE — ED Notes (Signed)
Brief changed, warm blanket given, patient comfortable with wife at bedside.
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
## 2021-02-26 ENCOUNTER — Emergency Department (HOSPITAL_COMMUNITY): Payer: No Typology Code available for payment source

## 2021-02-26 ENCOUNTER — Inpatient Hospital Stay (HOSPITAL_COMMUNITY): Payer: No Typology Code available for payment source | Admitting: Registered Nurse

## 2021-02-26 ENCOUNTER — Encounter (HOSPITAL_COMMUNITY)
Admission: EM | Disposition: A | Discharge status: Skilled Nursing Facility | Payer: Self-pay | Source: Ambulatory Visit | Attending: Neurosurgery

## 2021-02-26 ENCOUNTER — Encounter (HOSPITAL_COMMUNITY): Payer: Self-pay | Admitting: Neurosurgery

## 2021-02-26 DIAGNOSIS — A498 Other bacterial infections of unspecified site: Secondary | ICD-10-CM | POA: Diagnosis not present

## 2021-02-26 DIAGNOSIS — B961 Klebsiella pneumoniae [K. pneumoniae] as the cause of diseases classified elsewhere: Secondary | ICD-10-CM | POA: Diagnosis present

## 2021-02-26 DIAGNOSIS — Z20822 Contact with and (suspected) exposure to covid-19: Secondary | ICD-10-CM | POA: Diagnosis present

## 2021-02-26 DIAGNOSIS — T8140XA Infection following a procedure, unspecified, initial encounter: Secondary | ICD-10-CM | POA: Diagnosis present

## 2021-02-26 DIAGNOSIS — I444 Left anterior fascicular block: Secondary | ICD-10-CM | POA: Diagnosis present

## 2021-02-26 DIAGNOSIS — G4730 Sleep apnea, unspecified: Secondary | ICD-10-CM | POA: Diagnosis present

## 2021-02-26 DIAGNOSIS — T8130XA Disruption of wound, unspecified, initial encounter: Secondary | ICD-10-CM | POA: Diagnosis present

## 2021-02-26 DIAGNOSIS — L89629 Pressure ulcer of left heel, unspecified stage: Secondary | ICD-10-CM | POA: Diagnosis not present

## 2021-02-26 DIAGNOSIS — Z8249 Family history of ischemic heart disease and other diseases of the circulatory system: Secondary | ICD-10-CM | POA: Diagnosis not present

## 2021-02-26 DIAGNOSIS — F3130 Bipolar disorder, current episode depressed, mild or moderate severity, unspecified: Secondary | ICD-10-CM | POA: Diagnosis present

## 2021-02-26 DIAGNOSIS — K219 Gastro-esophageal reflux disease without esophagitis: Secondary | ICD-10-CM | POA: Diagnosis present

## 2021-02-26 DIAGNOSIS — E669 Obesity, unspecified: Secondary | ICD-10-CM | POA: Diagnosis present

## 2021-02-26 DIAGNOSIS — Z8673 Personal history of transient ischemic attack (TIA), and cerebral infarction without residual deficits: Secondary | ICD-10-CM | POA: Diagnosis not present

## 2021-02-26 DIAGNOSIS — I129 Hypertensive chronic kidney disease with stage 1 through stage 4 chronic kidney disease, or unspecified chronic kidney disease: Secondary | ICD-10-CM | POA: Diagnosis present

## 2021-02-26 DIAGNOSIS — L89620 Pressure ulcer of left heel, unstageable: Secondary | ICD-10-CM | POA: Diagnosis present

## 2021-02-26 DIAGNOSIS — T8142XA Infection following a procedure, deep incisional surgical site, initial encounter: Secondary | ICD-10-CM | POA: Diagnosis not present

## 2021-02-26 DIAGNOSIS — Z7982 Long term (current) use of aspirin: Secondary | ICD-10-CM | POA: Diagnosis not present

## 2021-02-26 DIAGNOSIS — F419 Anxiety disorder, unspecified: Secondary | ICD-10-CM | POA: Diagnosis present

## 2021-02-26 DIAGNOSIS — Z5320 Procedure and treatment not carried out because of patient's decision for unspecified reasons: Secondary | ICD-10-CM | POA: Diagnosis not present

## 2021-02-26 DIAGNOSIS — F17210 Nicotine dependence, cigarettes, uncomplicated: Secondary | ICD-10-CM | POA: Diagnosis present

## 2021-02-26 DIAGNOSIS — Z79899 Other long term (current) drug therapy: Secondary | ICD-10-CM | POA: Diagnosis not present

## 2021-02-26 DIAGNOSIS — N182 Chronic kidney disease, stage 2 (mild): Secondary | ICD-10-CM | POA: Diagnosis present

## 2021-02-26 DIAGNOSIS — Z981 Arthrodesis status: Secondary | ICD-10-CM | POA: Diagnosis not present

## 2021-02-26 DIAGNOSIS — F4310 Post-traumatic stress disorder, unspecified: Secondary | ICD-10-CM | POA: Diagnosis present

## 2021-02-26 DIAGNOSIS — Z803 Family history of malignant neoplasm of breast: Secondary | ICD-10-CM | POA: Diagnosis not present

## 2021-02-26 DIAGNOSIS — Z6830 Body mass index (BMI) 30.0-30.9, adult: Secondary | ICD-10-CM | POA: Diagnosis not present

## 2021-02-26 HISTORY — PX: LUMBAR WOUND DEBRIDEMENT: SHX1988

## 2021-02-26 HISTORY — PX: APPLICATION OF WOUND VAC: SHX5189

## 2021-02-26 LAB — C-REACTIVE PROTEIN: CRP: 1 mg/dL — ABNORMAL HIGH (ref <1.0)

## 2021-02-26 LAB — TYPE AND SCREEN
ABO/RH(D): A POS
Antibody Screen: NEGATIVE

## 2021-02-26 LAB — RESP PANEL BY RT-PCR (FLU A&B, COVID) ARPGX2
Influenza A by PCR: NEGATIVE
Influenza B by PCR: NEGATIVE
SARS Coronavirus 2 by RT PCR: NEGATIVE

## 2021-02-26 LAB — PROCALCITONIN: Procalcitonin: <0.1 ng/mL

## 2021-02-26 SURGERY — LUMBAR WOUND DEBRIDEMENT
Anesthesia: General

## 2021-02-26 MED ORDER — LAMOTRIGINE 100 MG PO TABS
100.0000 mg | ORAL_TABLET | Freq: Two times a day (BID) | ORAL | Status: DC
  Administered 2021-02-26 – 2021-03-08 (×20): 100 mg via ORAL
  Filled 2021-02-26 (×12): qty 1
  Filled 2021-02-26: qty 4
  Filled 2021-02-26 (×6): qty 1
  Filled 2021-02-26: qty 4

## 2021-02-26 MED ORDER — CHLORHEXIDINE GLUCONATE 0.12 % MT SOLN
OROMUCOSAL | Status: AC
  Administered 2021-02-26: 15 mL via OROMUCOSAL
  Filled 2021-02-26: qty 15

## 2021-02-26 MED ORDER — CHLORHEXIDINE GLUCONATE 0.12 % MT SOLN: 15.0000 mL | Freq: Once | OROMUCOSAL | Status: AC

## 2021-02-26 MED ORDER — PHENOL 1.4 % MT LIQD: 1.0000 | OROMUCOSAL | Status: DC | PRN

## 2021-02-26 MED ORDER — ACETAMINOPHEN 650 MG RE SUPP: 650.0000 mg | RECTAL | Status: DC | PRN

## 2021-02-26 MED ORDER — CYCLOBENZAPRINE HCL 10 MG PO TABS: 10.0000 mg | ORAL_TABLET | Freq: Three times a day (TID) | ORAL | Status: DC | PRN

## 2021-02-26 MED ORDER — CEFAZOLIN SODIUM-DEXTROSE 2-4 GM/100ML-% IV SOLN
INTRAVENOUS | Status: AC
  Filled 2021-02-26: qty 100

## 2021-02-26 MED ORDER — ENOXAPARIN SODIUM 40 MG/0.4ML IJ SOSY
40.0000 mg | PREFILLED_SYRINGE | INTRAMUSCULAR | Status: DC
  Administered 2021-02-27 – 2021-03-08 (×10): 40 mg via SUBCUTANEOUS
  Filled 2021-02-26 (×10): qty 0.4

## 2021-02-26 MED ORDER — VITAMIN B-12 1000 MCG PO TABS
1000.0000 ug | ORAL_TABLET | Freq: Every day | ORAL | Status: DC
  Administered 2021-02-26 – 2021-03-08 (×11): 1000 ug via ORAL
  Filled 2021-02-26 (×11): qty 1

## 2021-02-26 MED ORDER — ONDANSETRON HCL 4 MG/2ML IJ SOLN: 4.0000 mg | Freq: Four times a day (QID) | INTRAMUSCULAR | Status: DC | PRN

## 2021-02-26 MED ORDER — SUGAMMADEX SODIUM 200 MG/2ML IV SOLN
INTRAVENOUS | Status: DC | PRN
  Administered 2021-02-26: 200 mg via INTRAVENOUS

## 2021-02-26 MED ORDER — QUETIAPINE FUMARATE 50 MG PO TABS
200.0000 mg | ORAL_TABLET | Freq: Every day | ORAL | Status: DC
  Administered 2021-02-26 – 2021-03-07 (×10): 200 mg via ORAL
  Filled 2021-02-26 (×10): qty 4

## 2021-02-26 MED ORDER — ONDANSETRON HCL 4 MG PO TABS: 4.0000 mg | ORAL_TABLET | Freq: Four times a day (QID) | ORAL | Status: DC | PRN

## 2021-02-26 MED ORDER — FOLIC ACID 1 MG PO TABS
1.0000 mg | ORAL_TABLET | Freq: Every day | ORAL | Status: DC
  Administered 2021-02-26 – 2021-03-08 (×11): 1 mg via ORAL
  Filled 2021-02-26 (×11): qty 1

## 2021-02-26 MED ORDER — ORAL CARE MOUTH RINSE: 15.0000 mL | Freq: Once | OROMUCOSAL | Status: AC

## 2021-02-26 MED ORDER — ASPIRIN 81 MG PO CHEW
81.0000 mg | CHEWABLE_TABLET | Freq: Every day | ORAL | Status: DC
  Administered 2021-02-26 – 2021-03-08 (×11): 81 mg via ORAL
  Filled 2021-02-26 (×11): qty 1

## 2021-02-26 MED ORDER — PROPOFOL 10 MG/ML IV BOLUS
INTRAVENOUS | Status: DC | PRN
  Administered 2021-02-26: 160 mg via INTRAVENOUS

## 2021-02-26 MED ORDER — LEVETIRACETAM IN NACL 1500 MG/100ML IV SOLN
1500.0000 mg | INTRAVENOUS | Status: AC
  Administered 2021-02-26: 1500 mg via INTRAVENOUS
  Filled 2021-02-26: qty 100

## 2021-02-26 MED ORDER — SODIUM CHLORIDE 0.9 % IV SOLN
2.0000 g | Freq: Three times a day (TID) | INTRAVENOUS | Status: DC
  Administered 2021-02-26 – 2021-02-27 (×3): 2 g via INTRAVENOUS
  Filled 2021-02-26 (×3): qty 2

## 2021-02-26 MED ORDER — THROMBIN 5000 UNITS EX SOLR
CUTANEOUS | Status: AC
  Filled 2021-02-26: qty 10000

## 2021-02-26 MED ORDER — FENTANYL CITRATE (PF) 250 MCG/5ML IJ SOLN
INTRAMUSCULAR | Status: AC
  Filled 2021-02-26: qty 5

## 2021-02-26 MED ORDER — ROCURONIUM BROMIDE 10 MG/ML (PF) SYRINGE
PREFILLED_SYRINGE | INTRAVENOUS | Status: DC | PRN
  Administered 2021-02-26: 50 mg via INTRAVENOUS

## 2021-02-26 MED ORDER — MENTHOL 3 MG MT LOZG: 1.0000 | LOZENGE | OROMUCOSAL | Status: DC | PRN

## 2021-02-26 MED ORDER — FENTANYL CITRATE (PF) 250 MCG/5ML IJ SOLN
INTRAMUSCULAR | Status: DC | PRN
  Administered 2021-02-26 (×3): 50 ug via INTRAVENOUS
  Administered 2021-02-26: 100 ug via INTRAVENOUS

## 2021-02-26 MED ORDER — LIDOCAINE 2% (20 MG/ML) 5 ML SYRINGE
INTRAMUSCULAR | Status: DC | PRN
  Administered 2021-02-26: 100 mg via INTRAVENOUS

## 2021-02-26 MED ORDER — SERTRALINE HCL 100 MG PO TABS
100.0000 mg | ORAL_TABLET | Freq: Every day | ORAL | Status: DC
  Administered 2021-02-27 – 2021-03-08 (×10): 100 mg via ORAL
  Filled 2021-02-26 (×10): qty 1

## 2021-02-26 MED ORDER — BUPIVACAINE HCL (PF) 0.5 % IJ SOLN
INTRAMUSCULAR | Status: AC
  Filled 2021-02-26: qty 30

## 2021-02-26 MED ORDER — ENSURE ENLIVE PO LIQD
237.0000 mL | Freq: Three times a day (TID) | ORAL | Status: DC
  Administered 2021-02-26 – 2021-03-08 (×29): 237 mL via ORAL
  Filled 2021-02-26 (×32): qty 237

## 2021-02-26 MED ORDER — SODIUM CHLORIDE 0.9 % IV SOLN
725.0000 mg | Freq: Every day | INTRAVENOUS | Status: DC
  Filled 2021-02-26: qty 15

## 2021-02-26 MED ORDER — 0.9 % SODIUM CHLORIDE (POUR BTL) OPTIME
TOPICAL | Status: DC | PRN
  Administered 2021-02-26: 2000 mL

## 2021-02-26 MED ORDER — HYDROMORPHONE HCL 1 MG/ML IJ SOLN
0.5000 mg | INTRAMUSCULAR | Status: DC | PRN
  Administered 2021-03-01: 0.5 mg via INTRAVENOUS
  Filled 2021-02-26: qty 0.5

## 2021-02-26 MED ORDER — SODIUM CHLORIDE 0.9 % IV SOLN
250.0000 mL | INTRAVENOUS | Status: DC
  Administered 2021-02-26: 250 mL via INTRAVENOUS

## 2021-02-26 MED ORDER — HYDROXYZINE HCL 25 MG PO TABS
50.0000 mg | ORAL_TABLET | Freq: Two times a day (BID) | ORAL | Status: DC
  Administered 2021-02-26 – 2021-03-08 (×20): 50 mg via ORAL
  Filled 2021-02-26 (×20): qty 2

## 2021-02-26 MED ORDER — DOCUSATE SODIUM 100 MG PO CAPS: 100.0000 mg | ORAL_CAPSULE | Freq: Two times a day (BID) | ORAL | Status: DC | PRN

## 2021-02-26 MED ORDER — LEVETIRACETAM 750 MG PO TABS: 1500.0000 mg | ORAL_TABLET | Freq: Once | ORAL | Status: DC

## 2021-02-26 MED ORDER — OXYCODONE HCL 5 MG PO TABS
5.0000 mg | ORAL_TABLET | ORAL | Status: DC | PRN
  Administered 2021-02-27 – 2021-03-03 (×2): 5 mg via ORAL
  Filled 2021-02-26 (×2): qty 1

## 2021-02-26 MED ORDER — MAGNESIUM 200 MG PO TABS
400.0000 mg | ORAL_TABLET | Freq: Every day | ORAL | Status: DC
  Filled 2021-02-26: qty 2
  Filled 2021-02-26: qty 1

## 2021-02-26 MED ORDER — MIDAZOLAM HCL 2 MG/2ML IJ SOLN
INTRAMUSCULAR | Status: DC | PRN
  Administered 2021-02-26: 2 mg via INTRAVENOUS

## 2021-02-26 MED ORDER — ACETAMINOPHEN 325 MG PO TABS: 650.0000 mg | ORAL_TABLET | ORAL | Status: DC | PRN

## 2021-02-26 MED ORDER — METHYLPREDNISOLONE ACETATE 80 MG/ML IJ SUSP
INTRAMUSCULAR | Status: AC
  Filled 2021-02-26: qty 1

## 2021-02-26 MED ORDER — OXYCODONE HCL 5 MG PO TABS
10.0000 mg | ORAL_TABLET | ORAL | Status: DC | PRN
  Administered 2021-02-26 – 2021-03-08 (×9): 10 mg via ORAL
  Filled 2021-02-26 (×9): qty 2

## 2021-02-26 MED ORDER — FLEET ENEMA 7-19 GM/118ML RE ENEM: 1.0000 | ENEMA | Freq: Once | RECTAL | Status: DC | PRN

## 2021-02-26 MED ORDER — SODIUM CHLORIDE 0.9 % IV SOLN
8.0000 mg/kg | Freq: Every day | INTRAVENOUS | Status: DC
  Administered 2021-02-26: 750 mg via INTRAVENOUS
  Filled 2021-02-26 (×2): qty 15

## 2021-02-26 MED ORDER — HYDROXYZINE PAMOATE 50 MG PO CAPS
50.0000 mg | ORAL_CAPSULE | Freq: Two times a day (BID) | ORAL | Status: DC
  Filled 2021-02-26 (×2): qty 1

## 2021-02-26 MED ORDER — ONDANSETRON HCL 4 MG/2ML IJ SOLN
INTRAMUSCULAR | Status: DC | PRN
  Administered 2021-02-26: 4 mg via INTRAVENOUS

## 2021-02-26 MED ORDER — GABAPENTIN 300 MG PO CAPS
300.0000 mg | ORAL_CAPSULE | Freq: Two times a day (BID) | ORAL | Status: DC
  Administered 2021-02-26 – 2021-03-08 (×20): 300 mg via ORAL
  Filled 2021-02-26 (×20): qty 1

## 2021-02-26 MED ORDER — GADOBUTROL 1 MMOL/ML IV SOLN
9.0000 mL | Freq: Once | INTRAVENOUS | Status: AC | PRN
  Administered 2021-02-26: 9 mL via INTRAVENOUS

## 2021-02-26 MED ORDER — PROPOFOL 10 MG/ML IV BOLUS
INTRAVENOUS | Status: AC
  Filled 2021-02-26: qty 20

## 2021-02-26 MED ORDER — PANTOPRAZOLE SODIUM 40 MG PO TBEC
40.0000 mg | DELAYED_RELEASE_TABLET | Freq: Every day | ORAL | Status: DC
  Administered 2021-02-26 – 2021-03-07 (×10): 40 mg via ORAL
  Filled 2021-02-26 (×10): qty 1

## 2021-02-26 MED ORDER — SODIUM CHLORIDE 0.9% FLUSH
3.0000 mL | Freq: Two times a day (BID) | INTRAVENOUS | Status: DC
  Administered 2021-02-26 – 2021-02-27 (×3): 3 mL via INTRAVENOUS

## 2021-02-26 MED ORDER — DEXAMETHASONE SODIUM PHOSPHATE 10 MG/ML IJ SOLN
INTRAMUSCULAR | Status: DC | PRN
  Administered 2021-02-26: 10 mg via INTRAVENOUS

## 2021-02-26 MED ORDER — LEVETIRACETAM 750 MG PO TABS
1500.0000 mg | ORAL_TABLET | Freq: Two times a day (BID) | ORAL | Status: DC
  Administered 2021-02-26 – 2021-03-08 (×20): 1500 mg via ORAL
  Filled 2021-02-26 (×20): qty 2

## 2021-02-26 MED ORDER — MIDAZOLAM HCL 2 MG/2ML IJ SOLN
INTRAMUSCULAR | Status: AC
  Filled 2021-02-26: qty 2

## 2021-02-26 MED ORDER — ZINC SULFATE 220 (50 ZN) MG PO CAPS
220.0000 mg | ORAL_CAPSULE | Freq: Every day | ORAL | Status: DC
  Administered 2021-02-26 – 2021-03-08 (×11): 220 mg via ORAL
  Filled 2021-02-26 (×11): qty 1

## 2021-02-26 MED ORDER — SODIUM BICARBONATE 650 MG PO TABS
650.0000 mg | ORAL_TABLET | Freq: Three times a day (TID) | ORAL | Status: DC
  Administered 2021-02-26 – 2021-03-08 (×31): 650 mg via ORAL
  Filled 2021-02-26 (×31): qty 1

## 2021-02-26 MED ORDER — LIDOCAINE-EPINEPHRINE 1 %-1:100000 IJ SOLN
INTRAMUSCULAR | Status: AC
  Filled 2021-02-26: qty 1

## 2021-02-26 MED ORDER — MAGNESIUM OXIDE -MG SUPPLEMENT 400 (240 MG) MG PO TABS
400.0000 mg | ORAL_TABLET | Freq: Every day | ORAL | Status: DC
  Administered 2021-02-26 – 2021-03-08 (×11): 400 mg via ORAL
  Filled 2021-02-26 (×11): qty 1

## 2021-02-26 MED ORDER — SUCCINYLCHOLINE CHLORIDE 200 MG/10ML IV SOSY
PREFILLED_SYRINGE | INTRAVENOUS | Status: DC | PRN
  Administered 2021-02-26: 140 mg via INTRAVENOUS

## 2021-02-26 MED ORDER — LACTATED RINGERS IV SOLN: INTRAVENOUS | Status: DC

## 2021-02-26 MED ORDER — POTASSIUM CHLORIDE IN NACL 20-0.9 MEQ/L-% IV SOLN
INTRAVENOUS | Status: DC
  Filled 2021-02-26 (×13): qty 1000

## 2021-02-26 MED ORDER — CEFAZOLIN SODIUM-DEXTROSE 2-4 GM/100ML-% IV SOLN
2.0000 g | Freq: Once | INTRAVENOUS | Status: AC
  Administered 2021-02-26: 2 g via INTRAVENOUS

## 2021-02-26 MED ORDER — SODIUM CHLORIDE 0.9% FLUSH: 3.0000 mL | INTRAVENOUS | Status: DC | PRN

## 2021-02-26 SURGICAL SUPPLY — 53 items
APL SKNCLS STERI-STRIP NONHPOA (GAUZE/BANDAGES/DRESSINGS) ×1
BAG COUNTER SPONGE SURGICOUNT (BAG) ×2 IMPLANT
BAG SPNG CNTER NS LX DISP (BAG) ×1
BAND INSRT 18 STRL LF DISP RB (MISCELLANEOUS) ×2
BAND RUBBER #18 3X1/16 STRL (MISCELLANEOUS) ×4 IMPLANT
BENZOIN TINCTURE PRP APPL 2/3 (GAUZE/BANDAGES/DRESSINGS) ×2 IMPLANT
BLADE CLIPPER SURG (BLADE) IMPLANT
BUR CARBIDE MATCH 3.0 (BURR) IMPLANT
BUR PRECISION FLUTE 5.0 (BURR) IMPLANT
CANISTER SUCT 3000ML PPV (MISCELLANEOUS) ×2 IMPLANT
CANISTER WOUND CARE 500ML ATS (WOUND CARE) ×1 IMPLANT
CNTNR URN SCR LID CUP LEK RST (MISCELLANEOUS) IMPLANT
CONT SPEC 4OZ STRL OR WHT (MISCELLANEOUS) ×2
DECANTER SPIKE VIAL GLASS SM (MISCELLANEOUS) ×1 IMPLANT
DRAPE LAPAROTOMY 100X72X124 (DRAPES) ×2 IMPLANT
DRAPE MICROSCOPE LEICA (MISCELLANEOUS) ×1 IMPLANT
DRAPE SURG 17X23 STRL (DRAPES) ×2 IMPLANT
DRSG MEPILEX BORDER 4X4 (GAUZE/BANDAGES/DRESSINGS) IMPLANT
DRSG OPSITE POSTOP 3X4 (GAUZE/BANDAGES/DRESSINGS) ×2 IMPLANT
DRSG VAC ATS SM SENSATRAC (GAUZE/BANDAGES/DRESSINGS) ×1 IMPLANT
DURAPREP 26ML APPLICATOR (WOUND CARE) ×2 IMPLANT
ELECT REM PT RETURN 9FT ADLT (ELECTROSURGICAL) ×2
ELECTRODE REM PT RTRN 9FT ADLT (ELECTROSURGICAL) ×1 IMPLANT
GAUZE 4X4 16PLY ~~LOC~~+RFID DBL (SPONGE) IMPLANT
GLOVE EXAM NITRILE XL STR (GLOVE) IMPLANT
GLOVE SURG LTX SZ7.5 (GLOVE) ×2 IMPLANT
GLOVE SURG UNDER POLY LF SZ7.5 (GLOVE) ×4 IMPLANT
GOWN STRL REUS W/ TWL LRG LVL3 (GOWN DISPOSABLE) ×2 IMPLANT
GOWN STRL REUS W/ TWL XL LVL3 (GOWN DISPOSABLE) IMPLANT
GOWN STRL REUS W/TWL 2XL LVL3 (GOWN DISPOSABLE) IMPLANT
GOWN STRL REUS W/TWL LRG LVL3 (GOWN DISPOSABLE) ×4
GOWN STRL REUS W/TWL XL LVL3 (GOWN DISPOSABLE)
HEMOSTAT POWDER KIT SURGIFOAM (HEMOSTASIS) ×2 IMPLANT
KIT BASIN OR (CUSTOM PROCEDURE TRAY) ×2 IMPLANT
KIT TURNOVER KIT B (KITS) ×2 IMPLANT
NDL SPNL 18GX3.5 QUINCKE PK (NEEDLE) IMPLANT
NEEDLE HYPO 22GX1.5 SAFETY (NEEDLE) ×2 IMPLANT
NEEDLE SPNL 18GX3.5 QUINCKE PK (NEEDLE) IMPLANT
NS IRRIG 1000ML POUR BTL (IV SOLUTION) ×3 IMPLANT
PACK LAMINECTOMY NEURO (CUSTOM PROCEDURE TRAY) ×2 IMPLANT
PAD ARMBOARD 7.5X6 YLW CONV (MISCELLANEOUS) ×6 IMPLANT
SPONGE SURGIFOAM ABS GEL SZ50 (HEMOSTASIS) IMPLANT
SPONGE T-LAP 4X18 ~~LOC~~+RFID (SPONGE) IMPLANT
STRIP CLOSURE SKIN 1/2X4 (GAUZE/BANDAGES/DRESSINGS) ×2 IMPLANT
SUT MNCRL AB 4-0 PS2 18 (SUTURE) ×2 IMPLANT
SUT VIC AB 0 CT1 18XCR BRD8 (SUTURE) ×1 IMPLANT
SUT VIC AB 0 CT1 8-18 (SUTURE) ×2
SUT VIC AB 2-0 CP2 18 (SUTURE) ×2 IMPLANT
SYR 3ML LL SCALE MARK (SYRINGE) IMPLANT
SYR BULB IRRIG 60ML STRL (SYRINGE) ×1 IMPLANT
TOWEL GREEN STERILE (TOWEL DISPOSABLE) ×2 IMPLANT
TOWEL GREEN STERILE FF (TOWEL DISPOSABLE) ×2 IMPLANT
WATER STERILE IRR 1000ML POUR (IV SOLUTION) ×2 IMPLANT
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
## 2021-02-26 NOTE — Transfer of Care (Signed)
Immediate Anesthesia Transfer of Care Note  Patient: Keith Dixon  Procedure(s) Performed: LUMBAR WOUND DEBRIDEMENT APPLICATION OF WOUND VAC  Patient Location: PACU  Anesthesia Type:General  Level of Consciousness: awake, alert  and oriented  Airway & Oxygen Therapy: Patient Spontanous Breathing  Post-op Assessment: Report given to RN and Post -op Vital signs reviewed and stable  Post vital signs: Reviewed and stable  Last Vitals:  Vitals Value Taken Time  BP 167/89 02/26/21 1635  Temp    Pulse    Resp 13 02/26/21 1637  SpO2    Vitals shown include unvalidated device data.  Last Pain:  Vitals:   02/26/21 1344  TempSrc: Oral  PainSc:          Complications: No notable events documented.

## 2021-02-26 NOTE — ED Provider Notes (Signed)
I assumed care of this patient.  Please see previous provider note for further details of Hx, PE.  Briefly patient is a 60 y.o. male who presented for assessment of possible post op infection from lumbar surgery. Currently pending MRI.   MRI notable for soft tissue infection. Consulted with Dr. Zada Finders from [place]. Admitted to [facility]. Hold Abx for now.    Fatima Blank, MD 02/26/21 [id number]

## 2021-02-26 NOTE — Progress Notes (Addendum)
Pharmacy Antibiotic Note  Keith Dixon is a 60 y.o. male admitted on 02/25/2021 with wound dehiscence following T10-pelvis thoracolumar reconstruction; pt is S/P lumbar wound debridement today.  Pharmacy has been consulted for cefepime and daptomycin dosing for wound infection.  WBC 5.9, afebrile; Scr 1.01, CrCl 90.1 ml/min  Plan: Cefepime 2 gm IV Q 8 hrs Daptomycin 8 mg/kg (750 mg) IV Q 24 hrs Check baseline CK and weekly while on daptomycin Monitor WBC (including eosinophils), temp, clinical course, cultures, renal function, CK  Height: 6\' 1"  (185.4 cm) Weight: 90.7 kg (200 lb) IBW/kg (Calculated) : 79.9  Temp (24hrs), Avg:98.2 F (36.8 C), Min:97.9 F (36.6 C), Max:98.7 F (37.1 C)  Recent Labs  Lab 02/25/21 1715  WBC 5.9  CREATININE 1.01    Estimated Creatinine Clearance: 90.1 mL/min (by C-G formula based on SCr of 1.01 mg/dL).    No Known Allergies  Antimicrobials this admission: Cefazolin pre-op X 1 10/18 Cefepime 10/18 >>  Microbiology results: 10/18 COVID, flu A, flu B: negative 9/8 Lumbar wound: Klebsiella pneumoniae (suscept to all abx tested except ampicillin) 10/18 Surgical cx: pending  Thank you for allowing pharmacy to be a part of this patient's care.  Gillermina Hu, PharmD, BCPS, [facility] Clinical Pharmacist 02/26/2021 5:27 PM

## 2021-02-26 NOTE — Anesthesia Preprocedure Evaluation (Addendum)
Anesthesia Evaluation  Patient identified by MRN, date of birth, ID band Patient awake    Reviewed: Allergy & Precautions, NPO status , Patient's Chart, lab work & pertinent test results  Airway Mallampati: II  TM Distance: >3 FB     Dental   Pulmonary sleep apnea , Current Smoker and Patient abstained from smoking.,    breath sounds clear to auscultation       Cardiovascular hypertension, + dysrhythmias  Rhythm:Regular Rate:Normal     Neuro/Psych Seizures -,  PSYCHIATRIC DISORDERS CVA    GI/Hepatic Neg liver ROS, GERD  ,  Endo/Other    Renal/GU Renal disease     Musculoskeletal  (+) Arthritis ,   Abdominal   Peds  Hematology   Anesthesia Other Findings   Reproductive/Obstetrics                            Anesthesia Physical Anesthesia Plan  ASA: 3  Anesthesia Plan: General   Post-op Pain Management:    Induction: Intravenous  PONV Risk Score and Plan: Treatment may vary due to age or medical condition  Airway Management Planned: Oral ETT  Additional Equipment:   Intra-op Plan:   Post-operative Plan: Extubation in OR  Informed Consent: I have reviewed the patients History and Physical, chart, labs and discussed the procedure including the risks, benefits and alternatives for the proposed anesthesia with the patient or authorized representative who has indicated his/her understanding and acceptance.     Dental advisory given  Plan Discussed with: CRNA and Anesthesiologist  Anesthesia Plan Comments:         Anesthesia Quick Evaluation

## 2021-02-26 NOTE — Op Note (Signed)
Procedure(s): LUMBAR WOUND DEBRIDEMENT APPLICATION OF WOUND VAC Procedure Note  Keith Dixon male 60 y.o. 02/26/2021  Procedure(s) and Anesthesia Type:    * LUMBAR WOUND DEBRIDEMENT - General    * APPLICATION OF WOUND VAC - General  Surgeon(s) and Role:    Marcello Moores, Dorcas Carrow, MD - Primary   Indications:  This is a 60 year old man with a history of L1-L5 TLIF's and T10 to iliac posterior instrumented fusion for thoracolumbar reconstruction who had a complicated postoperative course, and return to the ER for small focal dehiscence at the superior aspect of his wound.  He did not have any fevers or leukocytosis, and as procalcitonin and CRP were normal.  An MRI of the spine was obtained and did not show any large fluid collections.  Nevertheless, I elected to take him to the operating room for wound exploration, debridement and wound VAC placement.  Risk, benefits, alternatives, expected convalescence were discussed with the patient and his daughter.  Risk discussed included but were not limited to, bleeding, pain, infection, scar, death.  Informed consent was obtained     Surgeon: Vallarie Mare   Anesthesia: General with endotracheal intubation   Procedure Detail  LUMBAR WOUND DEBRIDEMENT, APPLICATION OF WOUND VAC  The patient was brought to the op room.  General anesthesia was induced and patient was intubated by the anesthesia service.  After appropriate lines and monitors placed, patient was placed prone on an Wilson frame with all pressure points padded and eyes protected.  His lower back was prepped and draped in sterile fashion.  A timeout was performed.  Preoperative antibiotics and Keppra were given.  His wound was explored and there seemed to be a small subdermal pocket superior and inferior to the dehisced wound.  Therefore, a 10 blade was used to incise the skin to lengthen the wound in both directions.  His subcutaneous tissue layer was well-healed with fairly mature  scar.  In the wound bed, there was fair amount of granulation tissue.  The edges were debrided and there appeared to be good viable healing tissue.  There was no obvious tract going deeper but I elected to open the fascia focally.  I encountered small amount of yellowish clearish fluid but without obvious purulence.  This fluid was sent for culture.  In the subfascial space, adjacent to the spinous process there was a fair amount of granulation tissue but also some area of what appeared to be less viable tissue.  This was taken a specimen sent for culture as subfascial tissue.  I did not see any overt evidence of infection.  Meticulous hemostasis was obtained.  I elected to place a small wound VAC in his wound.  Vacuum was applied and good seal was noted.  Patient was then flipped supine and extubated by the anesthesia service.  All counts were correct at the end of surgery.  No complications were noted.   Findings: Small amount of clear yellowish fluid in the subfascial space.  No obvious purulence.  Estimated Blood Loss:  less than 50 mL         Drains: Wound VAC         Total IV Fluids: See anesthesia records  Blood Given: None         Specimens: Subfascial fluid, subfascial tissue sent for culture         Implants: None        Complications:  * No complications entered in OR log *  Disposition: PACU - hemodynamically stable.         Condition: stable

## 2021-02-26 NOTE — Transfer of Care (Deleted)
Immediate Anesthesia Transfer of Care Note  Patient: Keith Dixon  Procedure(s) Performed: LUMBAR WOUND DEBRIDEMENT APPLICATION OF WOUND VAC  Patient Location: PACU  Anesthesia Type:General  Level of Consciousness: drowsy  Airway & Oxygen Therapy: Patient Spontanous Breathing and Patient connected to face mask  Post-op Assessment: Report given to RN and Post -op Vital signs reviewed and stable  Post vital signs: stable  Last Vitals:  Vitals Value Taken Time  BP    Temp    Pulse    Resp    SpO2      Last Pain:  Vitals:   02/26/21 1344  TempSrc: Oral  PainSc:          Complications: No notable events documented.

## 2021-02-26 NOTE — Anesthesia Procedure Notes (Signed)
Procedure Name: Intubation Date/Time: 02/26/2021 3:19 PM Performed by: Clearnce Sorrel, CRNA Pre-anesthesia Checklist: Patient identified, Emergency Drugs available, Suction available and Patient being monitored Patient Re-evaluated:Patient Re-evaluated prior to induction Oxygen Delivery Method: Circle System Utilized Preoxygenation: Pre-oxygenation with 100% oxygen Induction Type: IV induction Ventilation: Mask ventilation without difficulty Laryngoscope Size: Mac and 4 Grade View: Grade I Tube type: Oral Tube size: 7.5 mm Number of attempts: 1 Airway Equipment and Method: Stylet and Oral airway Placement Confirmation: ETT inserted through vocal cords under direct vision, positive ETCO2 and breath sounds checked- equal and bilateral Secured at: 23 cm Tube secured with: Tape Dental Injury: Teeth and Oropharynx as per pre-operative assessment

## 2021-02-27 ENCOUNTER — Inpatient Hospital Stay: Payer: Self-pay

## 2021-02-27 ENCOUNTER — Encounter (HOSPITAL_COMMUNITY): Payer: Self-pay | Admitting: Neurosurgery

## 2021-02-27 DIAGNOSIS — T8142XA Infection following a procedure, deep incisional surgical site, initial encounter: Secondary | ICD-10-CM

## 2021-02-27 LAB — CK: Total CK: 57 U/L (ref 49–397)

## 2021-02-27 LAB — PROCALCITONIN: Procalcitonin: <0.1 ng/mL

## 2021-02-27 MED ORDER — SODIUM CHLORIDE 0.9 % IV SOLN
2.0000 g | INTRAVENOUS | Status: DC
  Administered 2021-02-27 – 2021-03-08 (×10): 2 g via INTRAVENOUS
  Filled 2021-02-27 (×10): qty 20

## 2021-02-27 NOTE — Consult Note (Addendum)
[place] Nurse Consult Note: Call placed to Dr. Marcello Moores' office and message left with Caryl Pina, RN requesting the provider's preference for either  NPWT dressing change tomorrow (Thursday) and Saturday's dressing being performed by the provider  OR   2.  [place] Nurse to change dressing on Friday and place on M/W/F dressing change schedule.  Nurse relayed message to Dr. Marcello Moores and Caryl Pina returned call to this writer. Dr. Marcello Moores prefers [place] Nurse to change dressing on Friday and begin M/W/F NPWT dressing changes to lumbar wound.  Next dressing change is due on Friday, 03/01/21.  Supplies ordered to room ( 3 small NPWT dressings and 1 new cannister).   [place] nursing team will follow, and will remain available to this patient, the nursing and medical teams.   Thanks, Maudie Flakes, MSN, RN, [place], Arther Abbott  Pager# [phone]

## 2021-02-27 NOTE — Progress Notes (Signed)
PT Cancellation Note  Patient Details Name: BRALYN FOLKERT MRN: [id number] DOB: 1960-07-06   Cancelled Treatment:    Reason Eval/Treat Not Completed: Other (comment).  Pt was attempted x 2 today, but back brace is not in room.  Per pt the brace is in rehab setting where he was being seen, and has a family member getting it for him.  Will retry as time and pt allow.   Ramond Dial 02/27/2021, 4:18 PM  Mee Hives, PT PhD Acute Rehab Dept. Number: [place] and [place]

## 2021-02-27 NOTE — Consult Note (Signed)
W[facility] Nurse Consult Note: Reason for Consult:NPWT dressing changes to lumbar site of surgical debridement. Dressing applied in [place] on 02/26/21.  Next dressing change due on Thursday, 02/28/21. Wound type: Surgical debridement Pressure Injury POA: N/A  WOC nurse will plan for dressing change on Thursday, 10/20.  There are no WOC Nurses in house on Saturday, so will need to determine plan for dressing change on that day (either Provider or Provider's designee). Can resume M/W/F dressing changes next week with [place] performing if patient is still in house.  [place] nursing team will follow, and will remain available to this patient, the nursing and medical teams.   Thanks, Maudie Flakes, MSN, RN, [place], Arther Abbott  Pager# [phone]

## 2021-02-27 NOTE — Progress Notes (Signed)
OT Cancellation Note  Patient Details Name: TEIGEN BELLIN MRN: [id number] DOB: 1961-04-18   Cancelled Treatment:    Reason Eval/Treat Not Completed: Other (comment). OT eval attempted twice today, however pt's ordered back brace not in room. Notified nsg. Per pt the brace is in rehab setting where he was being seen, and has a family member getting it for him. Will follow up as able.  Helane Gunther, OT/L  Acute Rehab [place] 02/27/2021, 5:28 PM

## 2021-02-27 NOTE — Progress Notes (Signed)
Subjective: Patient reports only minimal discomfort  Objective: Vital signs in last 24 hours: Temp:  [98.2 F (36.8 C)-98.9 F (37.2 C)] 98.3 F (36.8 C) (10/19 0718) Pulse Rate:  [77-98] 88 (10/19 0718) Resp:  [12-19] 19 (10/19 0718) BP: (133-167)/(50-93) 146/83 (10/19 0718) SpO2:  [98 %-100 %] 99 % (10/19 0718)  Intake/Output from previous day: 10/18 0701 - 10/19 0700 In: 800 [I.V.:800] Out: 10 [Blood:10] Intake/Output this shift: No intake/output data recorded. NAD Patient deconditioned, but full neurologic strength in [place] Wound vac in place  Lab Results: Recent Labs    02/25/21 1715  WBC 5.9  HGB 9.2*  HCT 29.3*  PLT 336   BMET Recent Labs    02/25/21 1715  NA 137  K 3.9  CL 105  CO2 24  GLUCOSE 95  BUN 20  CREATININE 1.01  CALCIUM 9.3    Studies/Results: MR THORACIC SPINE W WO CONTRAST  Result Date: 02/26/2021 CLINICAL DATA:  Mid back pain, evaluate for infection EXAM: MRI THORACIC WITHOUT AND WITH CONTRAST TECHNIQUE: Multiplanar and multiecho pulse sequences of the thoracic spine were obtained without and with intravenous contrast. CONTRAST:  45mL GADAVIST GADOBUTROL 1 MMOL/ML IV SOLN COMPARISON:  CT T and L-spine 01/16/2021, correlation is also made with MRI T-spine 09/13/2020. FINDINGS: Alignment: S shaped curvature of the thoracolumbar spine. No significant listhesis. Vertebrae: No acute fracture or abnormal signal. Redemonstrated bilateral pedicle screws in T10 through L1, with additional hardware not included in the field of view. Cord: Evaluation inferior to the T10 level is limited by susceptibility artifact. The superior and mid spinal cord is normal in signal and morphology. No abnormal enhancement. Paraspinal and other soft tissues: Extensive T2 hyperintense signal and contrast enhancement in the subcutaneous soft tissues of the back, extending approximately from the level of T6 to the inferior edge of the field-of-view, likely continuing into the  lumbar spine soft tissues the collection measures up to 6.6 x 4.8 x 19.6 cm (series 24, image 32 and series 19, image 9), although this likely underestimates the size of the collection given its extent off the field of view. The collection extends at the level of the fusion from just posterior to the hardware to the skin surface, with full-thickness skin defect the level of T8 to T11. surrounding muscular edema. Disc levels: Multilevel disc height loss. No high-grade spinal canal stenosis. No high-grade neural foraminal narrowing in the level superior to the fusion. IMPRESSION: Evaluation is somewhat limited by susceptibility artifact from spinal hardware. Within this limitation, extensive T2 hyperintense signal and contrast enhancement in the subcutaneous soft tissues of the back, concerning for postoperative fluid collection with superimposed infection. The enhancing collection extends approximately from the level of T6 to the inferior edge of the field-of-view and is suspected to continue into the lumbar spine soft tissues. The collection extends from the posterior aspect of the spinal column and hardware to the skin surface, with a full-thickness skin defect from T8-T11. Electronically Signed   By: Merilyn Baba M.D.   On: 02/26/2021 02:19   [place] EKG SITE RITE  Result Date: 02/27/2021 If Site Rite image not attached, placement could not be confirmed due to current cardiac rhythm.   Assessment/Plan: 60 yo M with hx of T10-iliac fusion with wound dehiscence - plan for IV atbx - f/u cultures - wound vac change this Friday   Vallarie Mare 02/27/2021, 2:10 PM

## 2021-02-27 NOTE — Consult Note (Signed)
[facility]    Date of Admission:  02/25/2021   Total days of antibiotics: 2 dapto/cefepime               Reason for Consult: Wound infection    Referring Provider: Marcello Moores   Assessment: Wound infection Pressure ulcer  Plan: Change anbx to ceftriaxone alone Place pic- explained to pt Opat orders Off loading of heel WOC f/u of heel   Comment-  Expect his klebsiella is still the culprit.  Will await his Cx.   Thank you so much for this interesting consult,  Active Problems:   Post op infection    aspirin  81 mg Oral Daily   enoxaparin  40 mg Subcutaneous Q24H   feeding supplement  237 mL Oral TID   folic acid  1 mg Oral Daily   gabapentin  300 mg Oral BID   hydrOXYzine  50 mg Oral Q12H   lamoTRIgine  100 mg Oral BID   levETIRAcetam  1,500 mg Oral BID   magnesium oxide  400 mg Oral Daily   pantoprazole  40 mg Oral QHS   QUEtiapine  200 mg Oral QHS   sertraline  100 mg Oral QPC breakfast   sodium bicarbonate  650 mg Oral TID   sodium chloride flush  3 mL Intravenous Q12H   cyanocobalamin  1,000 mcg Oral Daily   zinc sulfate  220 mg Oral Daily    HPI: Keith Dixon is a 60 y.o. male with hx of anterior lateral spinal fusion 12-12-20, posterior fusion on 12-13-20. His course was complicated by ARF (requiring HD), and wound exploration 01-17-21. He was given a 4 week course of bactrim for his wound issues and was d/c skilled rehab.  His Cx grew Klebsiella pneumonia. R- amp. He returned 9-29 with worsening renal function, was able to be d/c to SNF on 10-3. This was attributed to bactrim.  He returns on 10-18 with ~ 4 cm wound dehiscence. He denied f/c. CRP 1, ESR  35.  He was taken to the 10-18 a small amt of fluid was found and sent for Cx. He was debrided. Cx pending.   States he has loss of strength and sensation in his LE since surgery.   Review of Systems: Review of Systems  Constitutional:  Negative for chills and fever.   Respiratory:  Negative for cough and shortness of breath.   Gastrointestinal:  Negative for constipation and diarrhea.  Genitourinary:  Negative for dysuria.  Neurological:  Positive for sensory change and weakness.   Past Medical History:  Diagnosis Date   Anxiety    Arthritis    Bipolar 1 disorder, depressed ([place])    Bipolar disorder ([place])    Depression    History of posttraumatic stress disorder (PTSD)    Hypertension    PTSD (post-traumatic stress disorder)    Seizures ([facility])    Sleep apnea    Smoker    Stroke ([place])    Tobacco use disorder     Social History   Tobacco Use   Smoking status: Every Day    Packs/day: 0.50    Years: 20.00    Pack years: 10.00    Types: Cigarettes   Smokeless tobacco: Never  Vaping Use   Vaping Use: Never used  Substance Use Topics   Alcohol use: Yes    Alcohol/week: 2.0 standard drinks    Types: 2 Shots of liquor per week  Drug use: Yes    Types: Marijuana    Comment: pt stated he quit 2 weeks ago 08/10/12, pt reports doing marijuana on 11/13/12    Family History  Problem Relation Age of Onset   Seizures Mother    Breast cancer Mother 51   Hypertension Brother      Medications: Scheduled:  aspirin  81 mg Oral Daily   enoxaparin  40 mg Subcutaneous Q24H   feeding supplement  237 mL Oral TID   folic acid  1 mg Oral Daily   gabapentin  300 mg Oral BID   hydrOXYzine  50 mg Oral Q12H   lamoTRIgine  100 mg Oral BID   levETIRAcetam  1,500 mg Oral BID   magnesium oxide  400 mg Oral Daily   pantoprazole  40 mg Oral QHS   QUEtiapine  200 mg Oral QHS   sertraline  100 mg Oral QPC breakfast   sodium bicarbonate  650 mg Oral TID   sodium chloride flush  3 mL Intravenous Q12H   cyanocobalamin  1,000 mcg Oral Daily   zinc sulfate  220 mg Oral Daily    Abtx:  Anti-infectives (From admission, onward)    Start     Dose/Rate Route Frequency Ordered Stop   02/26/21 1830  DAPTOmycin (CUBICIN) 750 mg in sodium chloride 0.9 % IVPB   Status:  Discontinued        725 mg 130 mL/hr over 30 Minutes Intravenous Daily 02/26/21 1749 02/26/21 1754   02/26/21 1830  DAPTOmycin (CUBICIN) 750 mg in sodium chloride 0.9 % IVPB        8 mg/kg  90.7 kg 130 mL/hr over 30 Minutes Intravenous Daily 02/26/21 1754     02/26/21 1800  ceFEPIme (MAXIPIME) 2 g in sodium chloride 0.9 % 100 mL IVPB        2 g 200 mL/hr over 30 Minutes Intravenous Every 8 hours 02/26/21 1733     02/26/21 1500  ceFAZolin (ANCEF) IVPB 2g/100 mL premix        2 g 200 mL/hr over 30 Minutes Intravenous  Once 02/26/21 1450 02/26/21 1521   02/26/21 1450  ceFAZolin (ANCEF) 2-4 GM/100ML-% IVPB       Note to Pharmacy: Cameron Sprang   : cabinet override      02/26/21 1450 02/26/21 1534         OBJECTIVE: Blood pressure (!) 146/83, pulse 88, temperature 98.3 F (36.8 C), temperature source Oral, resp. rate 19, height 6' 1" (1.854 m), weight 90.7 kg, SpO2 99 %.  Physical Exam Vitals reviewed.  Constitutional:      General: He is not in acute distress.    Appearance: He is not toxic-appearing.  HENT:     Mouth/Throat:     Mouth: Mucous membranes are moist.     Pharynx: No oropharyngeal exudate.  Eyes:     Extraocular Movements: Extraocular movements intact.     Pupils: Pupils are equal, round, and reactive to light.  Cardiovascular:     Rate and Rhythm: Normal rate and regular rhythm.  Pulmonary:     Effort: Pulmonary effort is normal.     Breath sounds: Normal breath sounds.  Abdominal:     General: Bowel sounds are normal.     Palpations: Abdomen is soft.  Musculoskeletal:       Arms:     Cervical back: Neck supple.     Comments: Vac place No surrounding erythema. Or fluctuance.   Skin:  Comments: Pressure ulcer L heel.   Neurological:     General: No focal deficit present.    Lab Results Results for orders placed or performed during the hospital encounter of 02/25/21 (from the past 48 hour(s))  CBG monitoring, ED     Status: Abnormal    Collection Time: 02/25/21  4:34 PM  Result Value Ref Range   Glucose-Capillary 109 (H) 70 - 99 mg/dL    Comment: Glucose reference range applies only to samples taken after fasting for at least 8 hours.  CBC with Differential     Status: Abnormal   Collection Time: 02/25/21  5:15 PM  Result Value Ref Range   WBC 5.9 4.0 - 10.5 K/uL   RBC 3.53 (L) 4.22 - 5.81 MIL/uL   Hemoglobin 9.2 (L) 13.0 - 17.0 g/dL   HCT 29.3 (L) 39.0 - 52.0 %   MCV 83.0 80.0 - 100.0 fL   MCH 26.1 26.0 - 34.0 pg   MCHC 31.4 30.0 - 36.0 g/dL   RDW 17.2 (H) 11.5 - 15.5 %   Platelets 336 150 - 400 K/uL   nRBC 0.0 0.0 - 0.2 %   Neutrophils Relative % 67 %   Neutro Abs 3.9 1.7 - 7.7 K/uL   Lymphocytes Relative 25 %   Lymphs Abs 1.5 0.7 - 4.0 K/uL   Monocytes Relative 7 %   Monocytes Absolute 0.4 0.1 - 1.0 K/uL   Eosinophils Relative 1 %   Eosinophils Absolute 0.1 0.0 - 0.5 K/uL   Basophils Relative 0 %   Basophils Absolute 0.0 0.0 - 0.1 K/uL   Immature Granulocytes 0 %   Abs Immature Granulocytes 0.01 0.00 - 0.07 K/uL    Comment: Performed at [facility], [address], [place], [place] [address]  Comprehensive metabolic panel     Status: Abnormal   Collection Time: 02/25/21  5:15 PM  Result Value Ref Range   Sodium 137 135 - 145 mmol/L   Potassium 3.9 3.5 - 5.1 mmol/L   Chloride 105 98 - 111 mmol/L   CO2 24 22 - 32 mmol/L   Glucose, Bld 95 70 - 99 mg/dL    Comment: Glucose reference range applies only to samples taken after fasting for at least 8 hours.   BUN 20 6 - 20 mg/dL   Creatinine, Ser 1.01 0.61 - 1.24 mg/dL   Calcium 9.3 8.9 - 10.3 mg/dL   Total Protein 6.4 (L) 6.5 - 8.1 g/dL   Albumin 2.8 (L) 3.5 - 5.0 g/dL   AST 17 15 - 41 U/L   ALT 16 0 - 44 U/L   Alkaline Phosphatase 92 38 - 126 U/L   Total Bilirubin <0.1 (L) 0.3 - 1.2 mg/dL   GFR, Estimated >60 >60 mL/min    Comment: (NOTE) Calculated using the CKD-EPI Creatinine Equation (2021)    Anion gap 8 5 - 15    Comment: Performed at  [facility], [place] [address], [place], [place] [address]  Procalcitonin - Baseline     Status: None   Collection Time: 02/25/21  5:15 PM  Result Value Ref Range   Procalcitonin <0.10 ng/mL    Comment:        Interpretation: PCT (Procalcitonin) <= 0.5 ng/mL: Systemic infection (sepsis) is not likely. Local bacterial infection is possible. (NOTE)       Sepsis PCT Algorithm           Lower Respiratory Tract  Infection PCT Algorithm    ----------------------------     ----------------------------         PCT < 0.25 ng/mL                PCT < 0.10 ng/mL          Strongly encourage             Strongly discourage   discontinuation of antibiotics    initiation of antibiotics    ----------------------------     -----------------------------       PCT 0.25 - 0.50 ng/mL            PCT 0.10 - 0.25 ng/mL               OR       >80% decrease in PCT            Discourage initiation of                                            antibiotics      Encourage discontinuation           of antibiotics    ----------------------------     -----------------------------         PCT >= 0.50 ng/mL              PCT 0.26 - 0.50 ng/mL               AND        <80% decrease in PCT             Encourage initiation of                                             antibiotics       Encourage continuation           of antibiotics    ----------------------------     -----------------------------        PCT >= 0.50 ng/mL                  PCT > 0.50 ng/mL               AND         increase in PCT                  Strongly encourage                                      initiation of antibiotics    Strongly encourage escalation           of antibiotics                                     -----------------------------                                           PCT <= 0.25 ng/mL  OR                                        > 80%  decrease in PCT                                      Discontinue / Do not initiate                                             antibiotics  Performed at [facility], [place] [address] [place], [place] [address]   Sedimentation rate     Status: Abnormal   Collection Time: 02/25/21  5:15 PM  Result Value Ref Range   Sed Rate 35 (H) 0 - 16 mm/hr    Comment: Performed at [facility] [address], [place], [place] [address]  Procalcitonin     Status: None   Collection Time: 02/26/21  2:15 AM  Result Value Ref Range   Procalcitonin <0.10 ng/mL    Comment:        Interpretation: PCT (Procalcitonin) <= 0.5 ng/mL: Systemic infection (sepsis) is not likely. Local bacterial infection is possible. (NOTE)       Sepsis PCT Algorithm           Lower Respiratory Tract                                      Infection PCT Algorithm    ----------------------------     ----------------------------         PCT < 0.25 ng/mL                PCT < 0.10 ng/mL          Strongly encourage             Strongly discourage   discontinuation of antibiotics    initiation of antibiotics    ----------------------------     -----------------------------       PCT 0.25 - 0.50 ng/mL            PCT 0.10 - 0.25 ng/mL               OR       >80% decrease in PCT            Discourage initiation of                                            antibiotics      Encourage discontinuation           of antibiotics    ----------------------------     -----------------------------         PCT >= 0.50 ng/mL              PCT 0.26 - 0.50 ng/mL               AND        <80% decrease in PCT  Encourage initiation of                                             antibiotics       Encourage continuation           of antibiotics    ----------------------------     -----------------------------        PCT >= 0.50 ng/mL                  PCT > 0.50 ng/mL               AND         increase in PCT                   Strongly encourage                                      initiation of antibiotics    Strongly encourage escalation           of antibiotics                                     -----------------------------                                           PCT <= 0.25 ng/mL                                                 OR                                        > 80% decrease in PCT                                      Discontinue / Do not initiate                                             antibiotics  Performed at [facility], [address], [place], [place] [address]   C-reactive protein     Status: Abnormal   Collection Time: 02/26/21  2:15 AM  Result Value Ref Range   CRP 1.0 (H) <1.0 mg/dL    Comment: Performed at [facility] [address] [place], [place] [address]  Resp Panel by RT-PCR (Flu A&B, Covid) Nasopharyngeal Swab     Status: None   Collection Time: 02/26/21  5:34 AM   Specimen: Nasopharyngeal Swab; Nasopharyngeal(NP) swabs in vial transport medium  Result Value Ref Range   SARS Coronavirus 2 by RT PCR NEGATIVE NEGATIVE    Comment: (NOTE) SARS-CoV-2 target nucleic acids are NOT DETECTED.  The SARS-CoV-2 RNA is generally detectable in upper respiratory specimens during the acute phase of infection. The lowest concentration of SARS-CoV-2 viral copies this assay can detect is 138 copies/mL. A negative result does not preclude SARS-Cov-2 infection and should not be used as the sole basis for treatment or other patient management decisions. A negative result may occur with  improper specimen collection/handling, submission of specimen other than nasopharyngeal swab, presence of viral mutation(s) within the areas targeted by this assay, and inadequate number of viral copies(<138 copies/mL). A negative result must be combined with clinical observations, patient history, and epidemiological information. The expected result is Negative.  Fact Sheet for  Patients:  EntrepreneurPulse.com.au  Fact Sheet for Healthcare Providers:  IncredibleEmployment.be  This test is no t yet approved or cleared by the [place] FDA and  has been authorized for detection and/or diagnosis of SARS-CoV-2 by FDA under an Emergency Use Authorization (EUA). This EUA will remain  in effect (meaning this test can be used) for the duration of the COVID-19 declaration under Section 564(b)(1) of the Act, 21 U.S.C.section 360bbb-3(b)(1), unless the authorization is terminated  or revoked sooner.       Influenza A by PCR NEGATIVE NEGATIVE   Influenza B by PCR NEGATIVE NEGATIVE    Comment: (NOTE) The Xpert Xpress SARS-CoV-2/FLU/RSV plus assay is intended as an aid in the diagnosis of influenza from Nasopharyngeal swab specimens and should not be used as a sole basis for treatment. Nasal washings and aspirates are unacceptable for Xpert Xpress SARS-CoV-2/FLU/RSV testing.  Fact Sheet for Patients: EntrepreneurPulse.com.au  Fact Sheet for Healthcare Providers: IncredibleEmployment.be  This test is not yet approved or cleared by the [place] FDA and has been authorized for detection and/or diagnosis of SARS-CoV-2 by FDA under an Emergency Use Authorization (EUA). This EUA will remain in effect (meaning this test can be used) for the duration of the COVID-19 declaration under Section 564(b)(1) of the Act, 21 U.S.C. section 360bbb-3(b)(1), unless the authorization is terminated or revoked.  Performed at [facility], [place] [address] [place], [place] [address]   Type and screen [place]     Status: None   Collection Time: 02/26/21  2:15 PM  Result Value Ref Range   ABO/RH(D) A POS    Antibody Screen NEG    Sample Expiration      03/01/2021,2359 Performed at [facility], [place] [address] [place], [place] [address]   Aerobic/Anaerobic Culture w  Gram Stain (surgical/deep wound)     Status: None (Preliminary result)   Collection Time: 02/26/21  4:02 PM   Specimen: PATH Other; Body Fluid  Result Value Ref Range   Specimen Description WOUND    Special Requests SUBFASICAL FLUID SWAB    Gram Stain NO WBC SEEN NO ORGANISMS SEEN     Culture      TOO YOUNG TO READ Performed at [facility], [place] [address], [place], [place] [address]    Report Status PENDING   Anaerobic culture w Gram Stain     Status: None (Preliminary result)   Collection Time: 02/26/21  4:11 PM   Specimen: PATH Other; Tissue  Result Value Ref Range   Specimen Description TISSUE    Special Requests       SUBFASICAL TISSUE Performed at [facility], [place] [address] [place],  [address]    Gram Stain PENDING    Culture PENDING    Report Status PENDING  Procalcitonin     Status: None   Collection Time: 02/27/21  6:07 AM  Result Value Ref Range   Procalcitonin <0.10 ng/mL    Comment:        Interpretation: PCT (Procalcitonin) <= 0.5 ng/mL: Systemic infection (sepsis) is not likely. Local bacterial infection is possible. (NOTE)       Sepsis PCT Algorithm           Lower Respiratory Tract                                      Infection PCT Algorithm    ----------------------------     ----------------------------         PCT < 0.25 ng/mL                PCT < 0.10 ng/mL          Strongly encourage             Strongly discourage   discontinuation of antibiotics    initiation of antibiotics    ----------------------------     -----------------------------       PCT 0.25 - 0.50 ng/mL            PCT 0.10 - 0.25 ng/mL               OR       >80% decrease in PCT            Discourage initiation of                                            antibiotics      Encourage discontinuation           of antibiotics    ----------------------------     -----------------------------         PCT >= 0.50 ng/mL              PCT 0.26 - 0.50 ng/mL                AND        <80% decrease in PCT             Encourage initiation of                                             antibiotics       Encourage continuation           of antibiotics    ----------------------------     -----------------------------        PCT >= 0.50 ng/mL                  PCT > 0.50 ng/mL               AND         increase in PCT                  Strongly encourage                                      initiation  of antibiotics    Strongly encourage escalation           of antibiotics                                     -----------------------------                                           PCT <= 0.25 ng/mL                                                 OR                                        > 80% decrease in PCT                                      Discontinue / Do not initiate                                             antibiotics  Performed at [facility], [address] [place], [place] [address]   CK     Status: None   Collection Time: 02/27/21  6:07 AM  Result Value Ref Range   Total CK 57 49 - 397 U/L    Comment: Performed at [facility], [place] [address] [place], [place] [address]      Component Value Date/Time   SDES TISSUE 02/26/2021 1611   SPECREQUEST  02/26/2021 1611     SUBFASICAL TISSUE Performed at [facility], [place] [address], [place], [place] [address]    CULT PENDING 02/26/2021 1611   REPTSTATUS PENDING 02/26/2021 1611   MR THORACIC SPINE W WO CONTRAST  Result Date: 02/26/2021 CLINICAL DATA:  Mid back pain, evaluate for infection EXAM: MRI THORACIC WITHOUT AND WITH CONTRAST TECHNIQUE: Multiplanar and multiecho pulse sequences of the thoracic spine were obtained without and with intravenous contrast. CONTRAST:  20m GADAVIST GADOBUTROL 1 MMOL/ML IV SOLN COMPARISON:  CT T and L-spine 01/16/2021, correlation is also made with MRI T-spine 09/13/2020. FINDINGS: Alignment: S shaped curvature of the thoracolumbar spine. No  significant listhesis. Vertebrae: No acute fracture or abnormal signal. Redemonstrated bilateral pedicle screws in T10 through L1, with additional hardware not included in the field of view. Cord: Evaluation inferior to the T10 level is limited by susceptibility artifact. The superior and mid spinal cord is normal in signal and morphology. No abnormal enhancement. Paraspinal and other soft tissues: Extensive T2 hyperintense signal and contrast enhancement in the subcutaneous soft tissues of the back, extending approximately from the level of T6 to the inferior edge of the field-of-view, likely continuing into the lumbar spine soft tissues the collection measures up to 6.6 x 4.8 x 19.6 cm (series 24, image 32 and series 19, image 9), although this likely underestimates the size of  the collection given its extent off the field of view. The collection extends at the level of the fusion from just posterior to the hardware to the skin surface, with full-thickness skin defect the level of T8 to T11. surrounding muscular edema. Disc levels: Multilevel disc height loss. No high-grade spinal canal stenosis. No high-grade neural foraminal narrowing in the level superior to the fusion. IMPRESSION: Evaluation is somewhat limited by susceptibility artifact from spinal hardware. Within this limitation, extensive T2 hyperintense signal and contrast enhancement in the subcutaneous soft tissues of the back, concerning for postoperative fluid collection with superimposed infection. The enhancing collection extends approximately from the level of T6 to the inferior edge of the field-of-view and is suspected to continue into the lumbar spine soft tissues. The collection extends from the posterior aspect of the spinal column and hardware to the skin surface, with a full-thickness skin defect from T8-T11. Electronically Signed   By: Merilyn Baba M.D.   On: 02/26/2021 02:19   Recent Results (from the past 240 hour(s))  Resp Panel by  RT-PCR (Flu A&B, Covid) Nasopharyngeal Swab     Status: None   Collection Time: 02/26/21  5:34 AM   Specimen: Nasopharyngeal Swab; Nasopharyngeal(NP) swabs in vial transport medium  Result Value Ref Range Status   SARS Coronavirus 2 by RT PCR NEGATIVE NEGATIVE Final    Comment: (NOTE) SARS-CoV-2 target nucleic acids are NOT DETECTED.  The SARS-CoV-2 RNA is generally detectable in upper respiratory specimens during the acute phase of infection. The lowest concentration of SARS-CoV-2 viral copies this assay can detect is 138 copies/mL. A negative result does not preclude SARS-Cov-2 infection and should not be used as the sole basis for treatment or other patient management decisions. A negative result may occur with  improper specimen collection/handling, submission of specimen other than nasopharyngeal swab, presence of viral mutation(s) within the areas targeted by this assay, and inadequate number of viral copies(<138 copies/mL). A negative result must be combined with clinical observations, patient history, and epidemiological information. The expected result is Negative.  Fact Sheet for Patients:  EntrepreneurPulse.com.au  Fact Sheet for Healthcare Providers:  IncredibleEmployment.be  This test is no t yet approved or cleared by the [place] FDA and  has been authorized for detection and/or diagnosis of SARS-CoV-2 by FDA under an Emergency Use Authorization (EUA). This EUA will remain  in effect (meaning this test can be used) for the duration of the COVID-19 declaration under Section 564(b)(1) of the Act, 21 U.S.C.section 360bbb-3(b)(1), unless the authorization is terminated  or revoked sooner.       Influenza A by PCR NEGATIVE NEGATIVE Final   Influenza B by PCR NEGATIVE NEGATIVE Final    Comment: (NOTE) The Xpert Xpress SARS-CoV-2/FLU/RSV plus assay is intended as an aid in the diagnosis of influenza from Nasopharyngeal swab  specimens and should not be used as a sole basis for treatment. Nasal washings and aspirates are unacceptable for Xpert Xpress SARS-CoV-2/FLU/RSV testing.  Fact Sheet for Patients: EntrepreneurPulse.com.au  Fact Sheet for Healthcare Providers: IncredibleEmployment.be  This test is not yet approved or cleared by the [place] FDA and has been authorized for detection and/or diagnosis of SARS-CoV-2 by FDA under an Emergency Use Authorization (EUA). This EUA will remain in effect (meaning this test can be used) for the duration of the COVID-19 declaration under Section 564(b)(1) of the Act, 21 U.S.C. section 360bbb-3(b)(1), unless the authorization is terminated or revoked.  Performed at [facility], [place] [address]  [place], [place] [address]   Aerobic/Anaerobic Culture w Gram Stain (surgical/deep wound)     Status: None (Preliminary result)   Collection Time: 02/26/21  4:02 PM   Specimen: PATH Other; Body Fluid  Result Value Ref Range Status   Specimen Description WOUND  Final   Special Requests SUBFASICAL FLUID SWAB  Final   Gram Stain NO WBC SEEN NO ORGANISMS SEEN   Final   Culture   Final    TOO YOUNG TO READ Performed at [facility] [place] [facility], [address], [place], [place] [address]    Report Status PENDING  Incomplete  Anaerobic culture w Gram Stain     Status: None (Preliminary result)   Collection Time: 02/26/21  4:11 PM   Specimen: PATH Other; Tissue  Result Value Ref Range Status   Specimen Description TISSUE  Final   Special Requests   Final     SUBFASICAL TISSUE Performed at [facility], [place] [address] [place], [place] [address]    Gram Stain PENDING  Incomplete   Culture PENDING  Incomplete   Report Status PENDING  Incomplete    Microbiology: Recent Results (from the past 240 hour(s))  Resp Panel by RT-PCR (Flu A&B, Covid) Nasopharyngeal Swab     Status: None   Collection Time: 02/26/21  5:34 AM    Specimen: Nasopharyngeal Swab; Nasopharyngeal(NP) swabs in vial transport medium  Result Value Ref Range Status   SARS Coronavirus 2 by RT PCR NEGATIVE NEGATIVE Final    Comment: (NOTE) SARS-CoV-2 target nucleic acids are NOT DETECTED.  The SARS-CoV-2 RNA is generally detectable in upper respiratory specimens during the acute phase of infection. The lowest concentration of SARS-CoV-2 viral copies this assay can detect is 138 copies/mL. A negative result does not preclude SARS-Cov-2 infection and should not be used as the sole basis for treatment or other patient management decisions. A negative result may occur with  improper specimen collection/handling, submission of specimen other than nasopharyngeal swab, presence of viral mutation(s) within the areas targeted by this assay, and inadequate number of viral copies(<138 copies/mL). A negative result must be combined with clinical observations, patient history, and epidemiological information. The expected result is Negative.  Fact Sheet for Patients:  EntrepreneurPulse.com.au  Fact Sheet for Healthcare Providers:  IncredibleEmployment.be  This test is no t yet approved or cleared by the [place] FDA and  has been authorized for detection and/or diagnosis of SARS-CoV-2 by FDA under an Emergency Use Authorization (EUA). This EUA will remain  in effect (meaning this test can be used) for the duration of the COVID-19 declaration under Section 564(b)(1) of the Act, 21 U.S.C.section 360bbb-3(b)(1), unless the authorization is terminated  or revoked sooner.       Influenza A by PCR NEGATIVE NEGATIVE Final   Influenza B by PCR NEGATIVE NEGATIVE Final    Comment: (NOTE) The Xpert Xpress SARS-CoV-2/FLU/RSV plus assay is intended as an aid in the diagnosis of influenza from Nasopharyngeal swab specimens and should not be used as a sole basis for treatment. Nasal washings and aspirates are  unacceptable for Xpert Xpress SARS-CoV-2/FLU/RSV testing.  Fact Sheet for Patients: EntrepreneurPulse.com.au  Fact Sheet for Healthcare Providers: IncredibleEmployment.be  This test is not yet approved or cleared by the [place] FDA and has been authorized for detection and/or diagnosis of SARS-CoV-2 by FDA under an Emergency Use Authorization (EUA). This EUA will remain in effect (meaning this test can be used) for the duration of the COVID-19 declaration under Section  564(b)(1) of the Act, 21 U.S.C. section 360bbb-3(b)(1), unless the authorization is terminated or revoked.  Performed at [facility], [place] [address] [place], [place] [address]   Aerobic/Anaerobic Culture w Gram Stain (surgical/deep wound)     Status: None (Preliminary result)   Collection Time: 02/26/21  4:02 PM   Specimen: PATH Other; Body Fluid  Result Value Ref Range Status   Specimen Description WOUND  Final   Special Requests SUBFASICAL FLUID SWAB  Final   Gram Stain NO WBC SEEN NO ORGANISMS SEEN   Final   Culture   Final    TOO YOUNG TO READ Performed at [facility], [address], [place], [place] [address]    Report Status PENDING  Incomplete  Anaerobic culture w Gram Stain     Status: None (Preliminary result)   Collection Time: 02/26/21  4:11 PM   Specimen: PATH Other; Tissue  Result Value Ref Range Status   Specimen Description TISSUE  Final   Special Requests   Final     SUBFASICAL TISSUE Performed at [facility], [place] [address] [place], [place] [address]    Gram Stain PENDING  Incomplete   Culture PENDING  Incomplete   Report Status PENDING  Incomplete    Radiographs and labs were personally reviewed by me.   Bobby Rumpf, MD [facility] for Infectious [place] [facility] [phone] 02/27/2021, 11:46 AM

## 2021-02-27 NOTE — Progress Notes (Signed)
PHARMACY CONSULT NOTE FOR:  OUTPATIENT  PARENTERAL ANTIBIOTIC THERAPY (OPAT)  Indication: Wound infection/osteomyelitis  Regimen: Ceftriaxone 2g IV Q24H  End date: 04/10/21  IV antibiotic discharge orders are pended. To discharging provider:  please sign these orders via discharge navigator,  Select New Orders & click on the button choice - Manage This Unsigned Work.     Thank you for allowing pharmacy to be a part of this patient's care.  Adria Dill, PharmD PGY-1 Acute Care Resident  02/27/2021 1:04 PM

## 2021-02-28 DIAGNOSIS — T8142XA Infection following a procedure, deep incisional surgical site, initial encounter: Secondary | ICD-10-CM | POA: Diagnosis not present

## 2021-02-28 DIAGNOSIS — L89629 Pressure ulcer of left heel, unspecified stage: Secondary | ICD-10-CM

## 2021-02-28 MED ORDER — SODIUM CHLORIDE 0.9% FLUSH
10.0000 mL | INTRAVENOUS | Status: DC | PRN
  Administered 2021-03-07: 10 mL

## 2021-02-28 MED ORDER — SODIUM CHLORIDE 0.9% FLUSH
10.0000 mL | Freq: Two times a day (BID) | INTRAVENOUS | Status: DC
  Administered 2021-02-28 – 2021-03-08 (×6): 10 mL

## 2021-02-28 MED ORDER — CHLORHEXIDINE GLUCONATE CLOTH 2 % EX PADS
6.0000 | MEDICATED_PAD | Freq: Every day | CUTANEOUS | Status: DC
  Administered 2021-02-28 – 2021-03-07 (×8): 6 via TOPICAL

## 2021-02-28 NOTE — Progress Notes (Signed)
INFECTIOUS DISEASE PROGRESS NOTE  ID: Keith Dixon is a 60 y.o. male with  Active Problems:   Post op infection  Subjective: No complaints  Abtx:  Anti-infectives (From admission, onward)    Start     Dose/Rate Route Frequency Ordered Stop   02/27/21 1600  cefTRIAXone (ROCEPHIN) 2 g in sodium chloride 0.9 % 100 mL IVPB        2 g 200 mL/hr over 30 Minutes Intravenous Every 24 hours 02/27/21 1205     02/26/21 1830  DAPTOmycin (CUBICIN) 750 mg in sodium chloride 0.9 % IVPB  Status:  Discontinued        725 mg 130 mL/hr over 30 Minutes Intravenous Daily 02/26/21 1749 02/26/21 1754   02/26/21 1830  DAPTOmycin (CUBICIN) 750 mg in sodium chloride 0.9 % IVPB  Status:  Discontinued        8 mg/kg  90.7 kg 130 mL/hr over 30 Minutes Intravenous Daily 02/26/21 1754 02/27/21 1205   02/26/21 1800  ceFEPIme (MAXIPIME) 2 g in sodium chloride 0.9 % 100 mL IVPB  Status:  Discontinued        2 g 200 mL/hr over 30 Minutes Intravenous Every 8 hours 02/26/21 1733 02/27/21 1205   02/26/21 1500  ceFAZolin (ANCEF) IVPB 2g/100 mL premix        2 g 200 mL/hr over 30 Minutes Intravenous  Once 02/26/21 1450 02/26/21 1521   02/26/21 1450  ceFAZolin (ANCEF) 2-4 GM/100ML-% IVPB       Note to Pharmacy: Cameron Sprang   : cabinet override      02/26/21 1450 02/26/21 1534       Medications: Scheduled:  aspirin  81 mg Oral Daily   Chlorhexidine Gluconate Cloth  6 each Topical Daily   enoxaparin  40 mg Subcutaneous Q24H   feeding supplement  237 mL Oral TID   folic acid  1 mg Oral Daily   gabapentin  300 mg Oral BID   hydrOXYzine  50 mg Oral Q12H   lamoTRIgine  100 mg Oral BID   levETIRAcetam  1,500 mg Oral BID   magnesium oxide  400 mg Oral Daily   pantoprazole  40 mg Oral QHS   QUEtiapine  200 mg Oral QHS   sertraline  100 mg Oral QPC breakfast   sodium bicarbonate  650 mg Oral TID   sodium chloride flush  10-40 mL Intracatheter Q12H   cyanocobalamin  1,000 mcg Oral Daily   zinc sulfate   220 mg Oral Daily    Objective: Vital signs in last 24 hours: Temp:  [97.9 F (36.6 C)-98.8 F (37.1 C)] 98.5 F (36.9 C) (10/20 0805) Pulse Rate:  [80-92] 83 (10/20 0805) Resp:  [19-20] 19 (10/20 0805) BP: (150-170)/(90-98) 170/98 (10/20 0805) SpO2:  [97 %-100 %] 100 % (10/20 0805)   General appearance: alert, cooperative, and no distress Resp: clear to auscultation bilaterally Cardio: regular rate and rhythm GI: normal findings: bowel sounds normal and soft, non-tender Incision/Wound: L heel wrapped, decubitus ulcer.   Lab Results Recent Labs    02/25/21 1715  WBC 5.9  HGB 9.2*  HCT 29.3*  NA 137  K 3.9  CL 105  CO2 24  BUN 20  CREATININE 1.01   Liver Panel Recent Labs    02/25/21 1715  PROT 6.4*  ALBUMIN 2.8*  AST 17  ALT 16  ALKPHOS 92  BILITOT <0.1*   Sedimentation Rate Recent Labs    02/25/21 1715  ESRSEDRATE 35*  C-Reactive Protein Recent Labs    02/26/21 0215  CRP 1.0*    Microbiology: Recent Results (from the past 240 hour(s))  Resp Panel by RT-PCR (Flu A&B, Covid) Nasopharyngeal Swab     Status: None   Collection Time: 02/26/21  5:34 AM   Specimen: Nasopharyngeal Swab; Nasopharyngeal(NP) swabs in vial transport medium  Result Value Ref Range Status   SARS Coronavirus 2 by RT PCR NEGATIVE NEGATIVE Final    Comment: (NOTE) SARS-CoV-2 target nucleic acids are NOT DETECTED.  The SARS-CoV-2 RNA is generally detectable in upper respiratory specimens during the acute phase of infection. The lowest concentration of SARS-CoV-2 viral copies this assay can detect is 138 copies/mL. A negative result does not preclude SARS-Cov-2 infection and should not be used as the sole basis for treatment or other patient management decisions. A negative result may occur with  improper specimen collection/handling, submission of specimen other than nasopharyngeal swab, presence of viral mutation(s) within the areas targeted by this assay, and inadequate  number of viral copies(<138 copies/mL). A negative result must be combined with clinical observations, patient history, and epidemiological information. The expected result is Negative.  Fact Sheet for Patients:  EntrepreneurPulse.com.au  Fact Sheet for Healthcare Providers:  IncredibleEmployment.be  This test is no t yet approved or cleared by the [place] FDA and  has been authorized for detection and/or diagnosis of SARS-CoV-2 by FDA under an Emergency Use Authorization (EUA). This EUA will remain  in effect (meaning this test can be used) for the duration of the COVID-19 declaration under Section 564(b)(1) of the Act, 21 U.S.C.section 360bbb-3(b)(1), unless the authorization is terminated  or revoked sooner.       Influenza A by PCR NEGATIVE NEGATIVE Final   Influenza B by PCR NEGATIVE NEGATIVE Final    Comment: (NOTE) The Xpert Xpress SARS-CoV-2/FLU/RSV plus assay is intended as an aid in the diagnosis of influenza from Nasopharyngeal swab specimens and should not be used as a sole basis for treatment. Nasal washings and aspirates are unacceptable for Xpert Xpress SARS-CoV-2/FLU/RSV testing.  Fact Sheet for Patients: EntrepreneurPulse.com.au  Fact Sheet for Healthcare Providers: IncredibleEmployment.be  This test is not yet approved or cleared by the [place] FDA and has been authorized for detection and/or diagnosis of SARS-CoV-2 by FDA under an Emergency Use Authorization (EUA). This EUA will remain in effect (meaning this test can be used) for the duration of the COVID-19 declaration under Section 564(b)(1) of the Act, 21 U.S.C. section 360bbb-3(b)(1), unless the authorization is terminated or revoked.  Performed at [facility], [place] [address], [place], [place] [address]   Aerobic/Anaerobic Culture w Gram Stain (surgical/deep wound)     Status: None (Preliminary result)    Collection Time: 02/26/21  4:02 PM   Specimen: PATH Other; Body Fluid  Result Value Ref Range Status   Specimen Description WOUND  Final   Special Requests SUBFASICAL FLUID SWAB  Final   Gram Stain NO WBC SEEN NO ORGANISMS SEEN   Final   Culture   Final    TOO YOUNG TO READ Performed at [facility], [address] [place], [place] [address]    Report Status PENDING  Incomplete  Anaerobic culture w Gram Stain     Status: None (Preliminary result)   Collection Time: 02/26/21  4:11 PM   Specimen: PATH Other; Tissue  Result Value Ref Range Status   Specimen Description TISSUE  Final   Special Requests  SUBFASICAL TISSUE  Final  Gram Stain   Final    ABUNDANT WBC PRESENT,BOTH PMN AND MONONUCLEAR NO ORGANISMS SEEN Performed at [facility], [place] [address], [place], [place] [address]    Culture PENDING  Incomplete   Report Status PENDING  Incomplete  Aerobic Culture w Gram Stain (superficial specimen)     Status: None (Preliminary result)   Collection Time: 02/26/21  4:11 PM   Specimen: Tissue  Result Value Ref Range Status   Specimen Description TISSUE  Final   Special Requests SUBFASICAL TISSUES SPEC B  Final   Gram Stain   Final    ABUNDANT WBC PRESENT,BOTH PMN AND MONONUCLEAR NO ORGANISMS SEEN Performed at [facility], [place] [address] [place], [place] [address]    Culture PENDING  Incomplete   Report Status PENDING  Incomplete    Studies/Results: [place] EKG SITE RITE  Result Date: 02/27/2021 If Site Rite image not attached, placement could not be confirmed due to current cardiac rhythm.    Assessment/Plan: Spinal fusion (8-3/4-22) Wound infection (prev Klebsiella) HTN  Total days of antibiotics: 2 ceftriaxone  Await his op Cx, still incubating No change in ceftriaxone.  BP management Will ask WOC to eval his heel.          Bobby Rumpf MD, FACP Infectious Diseases (pager) [phone] [email or web address] 02/28/2021, 10:28 AM   LOS: 2 days

## 2021-02-28 NOTE — Progress Notes (Signed)
Peripherally Inserted Central Catheter Placement  The IV Nurse has discussed with the patient and/or persons authorized to consent for the patient, the purpose of this procedure and the potential benefits and risks involved with this procedure.  The benefits include less needle sticks, lab draws from the catheter, and the patient may be discharged home with the catheter. Risks include, but not limited to, infection, bleeding, blood clot (thrombus formation), and puncture of an artery; nerve damage and irregular heartbeat and possibility to perform a PICC exchange if needed/ordered by physician.  Alternatives to this procedure were also discussed.  Bard Power PICC patient education guide, fact sheet on infection prevention and patient information card has been provided to patient /or left at bedside.    PICC Placement Documentation  PICC Single Lumen 97/53/00 Right Basilic 43 cm 1 cm (Active)  Indication for Insertion or Continuance of Line Home intravenous therapies (PICC only) 02/28/21 0911  Exposed Catheter (cm) 0 cm 02/28/21 0911  Site Assessment Clean;Dry;Intact 02/28/21 0911  Line Status Flushed;Blood return noted;Saline locked 02/28/21 0911  Dressing Type Transparent 02/28/21 0911  Dressing Status Clean;Dry;Intact 02/28/21 0911  Antimicrobial disc in place? Yes 02/28/21 0911  Dressing Change Due 03/07/21 02/28/21 0911       Scotty Court 02/28/2021, 9:13 AM

## 2021-02-28 NOTE — Evaluation (Signed)
Physical Therapy Evaluation Patient Details Name: Keith Dixon MRN: [id number] DOB: 07/18/1960 Today's Date: 02/28/2021  History of Present Illness  Pt is a 60 y.o. male who was admitted to [facility] on 10/17 for possible spinal infection.  MRI (+) for post-op fluid collection/infection from T6-T11. Underwent wound exploration/debridement on 10/18. 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 Has had multiple recent hospital admissions. Was recovering from spinal surgery at rehab when he was noted to have a regression in mobility and was diagnosed with spinal infection.  Pt. Is currently demonstrating dec LE strength, dec balance, transfer difficulty, gait deviations, and dec activity tolerance and would benefit from skilled PT in acute care to address deficits indicated in initial eval.  AMPAC score is indicating home with HH, however, pt. Has limited physical support at home and a full flight of stairs to get into home.  At this time, returning to rehab prior to transfer home would be the safest option.     Recommendations for follow up therapy are one component of a multi-disciplinary discharge planning process, led by the attending physician.  Recommendations may be updated based on patient status, additional functional criteria and insurance authorization.  Follow Up Recommendations SNF    Equipment Recommendations  Rolling walker with 5" wheels    Recommendations for Other Services       Precautions / Restrictions Precautions Precautions: Back Precaution Comments: Thoracic Required Braces or Orthoses: Spinal Brace Spinal Brace: Applied in sitting position;Other (comment) Other Brace: Clamshell Restrictions Weight  Bearing Restrictions: No      Mobility  Bed Mobility Overal bed mobility: Needs Assistance Bed Mobility: Rolling;Sidelying to Sit Rolling: Min assist Sidelying to sit: Min assist       General bed mobility comments: Req's min A to complete log roll and SL > sit.  Demos poor sitting balance at EOB - states this is normal for him.  Pt. has significant posterior lean but grabs hold of EOB to assist with sitting balance.  Clamshell brace applied in sitting. Patient Response: Cooperative  Transfers Overall transfer level: Needs assistance Equipment used: Rolling walker (2 wheeled) Transfers: Sit to/from Stand Sit to Stand: Mod assist         General transfer comment: Pt. req's mod A to complete standing from elevated surface.  Demos heavy L sided lean when attempting to stand.  Takes inc time to transition UEs to RW and gain standing balance.  Brace has to be adjusted in standing.  Ambulation/Gait Ambulation/Gait assistance: Min assist Gait Distance (Feet): 15 Feet Assistive device: Rolling walker (2 wheeled) Gait Pattern/deviations: Decreased step length - right;Decreased step length - left     General Gait Details: Pt. amb short distance in room with min A only.  C/o fatigue and L LE tremors with activity limiting gait distance.  Pt. demos fair balance overall.  Slow cadence.  Stairs            Wheelchair Mobility    Modified Rankin (Stroke Patients Only)       Balance Overall balance assessment: Needs assistance Sitting-balance support: Bilateral upper extremity supported;Feet supported Sitting balance-Leahy Scale: Poor     Standing balance support: Bilateral upper extremity supported;During functional activity Standing balance-Leahy Scale: Poor  Pertinent Vitals/Pain Pain Assessment: 0-10 Pain Score: 8  Pain Location: Thoracic spine Pain Descriptors / Indicators: Aching;Tender;Discomfort Pain Intervention(s):  Limited activity within patient's tolerance;Monitored during session;Repositioned    Home Living Family/patient expects to be discharged to:: Other (Comment)                 Additional Comments: Pt. has been in rehab for last 3 weeks, would like to return and finish rehab prior to going back home    Prior Function Level of Independence: Needs assistance   Gait / Transfers Assistance Needed: Pt. states level of assistance needed over last 3 weeks has been fluctuating, but has been able to amb in halls at times with use of RW and min A  ADL's / Homemaking Assistance Needed: Pt. has been having assist with ADLs in rehab        Hand Dominance        Extremity/Trunk Assessment   Upper Extremity Assessment Upper Extremity Assessment: Defer to OT evaluation    Lower Extremity Assessment Lower Extremity Assessment: RLE deficits/detail;LLE deficits/detail RLE Deficits / Details: Grossly 3-/5 LLE Deficits / Details: Grossly 3-/5       Communication   Communication: No difficulties  Cognition Arousal/Alertness: Awake/alert Behavior During Therapy: WFL for tasks assessed/performed Overall Cognitive Status: Within Functional Limits for tasks assessed                                 General Comments: Alert and oriented x 3.  Daughter present in room during eval.  Agreeable to attempt walking in room.      General Comments General comments (skin integrity, edema, etc.): Pt. expresses wanting to amb again later in day.  Pt. is encouraged to ask NSG/PCT if he can take a few steps around bed prior to getting back in when it is time to transfer BTB.    Exercises     Assessment/Plan    PT Assessment Patient needs continued PT services  PT Problem List Decreased strength;Decreased mobility;Decreased activity tolerance;Decreased balance;Pain       PT Treatment Interventions DME instruction;Therapeutic exercise;Gait training;Balance training;Functional mobility  training;Therapeutic activities;Patient/family education;Stair training    PT Goals (Current goals can be found in the Care Plan section)  Acute Rehab PT Goals Patient Stated Goal: Pt's goal is to return to rehab to get stronger PT Goal Formulation: With patient Time For Goal Achievement: 03/12/21 Potential to Achieve Goals: Good    Frequency Min 5X/week   Barriers to discharge Decreased caregiver support;Inaccessible home environment Pt. lives home alone, has GF who is only available to help after 2:30 each day because she works.  Has a full flight of stair to get to main level bedroom/bathroom.    Co-evaluation               AM-PAC PT "6 Clicks" Mobility  Outcome Measure Help needed turning from your back to your side while in a flat bed without using bedrails?: A Little Help needed moving from lying on your back to sitting on the side of a flat bed without using bedrails?: A Little Help needed moving to and from a bed to a chair (including a wheelchair)?: A Little Help needed standing up from a chair using your arms (e.g., wheelchair or bedside chair)?: A Lot Help needed to walk in hospital room?: A Little Help needed climbing 3-5 steps with a railing? : A Lot 6 Click Score: 16  End of Session Equipment Utilized During Treatment: Gait belt Activity Tolerance: Patient tolerated treatment well;Patient limited by fatigue Patient left: in chair;with call bell/phone within reach (Chair alarm not working properly.  Pt. understands not to get up without assist, family in room.)   PT Visit Diagnosis: Other abnormalities of gait and mobility (R26.89);Unsteadiness on feet (R26.81);Pain Pain - part of body:  (back)    Time: 3291-9166 PT Time Calculation (min) (ACUTE ONLY): 38 min   Charges:   PT Evaluation $PT Eval Low Complexity: 1 Low PT Treatments $Gait Training: 8-22 mins $Therapeutic Activity: 8-22 mins       Ruchel Brandenburger A. Tricha Ruggirello, PT, DPT Acute Rehabilitation  Services Office: [place] 02/28/2021, 11:21 AM

## 2021-02-28 NOTE — Evaluation (Signed)
Occupational Therapy Evaluation Patient Details Name: Keith Dixon MRN: [id number] DOB: 10/31/1960 Today's Date: 02/28/2021   History of Present Illness Pt is a 60 y.o. male who was admitted to [facility] on 10/17 for possible spinal infection.  MRI (+) for post-op fluid collection/infection from T6-T11. Underwent wound exploration/debridement on 10/18. 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 Pt will benefit from continued acute OT to address the below listed deficits and maximize independence with basic ADLs prior to d/c to venue below. At baseline, pt is mod I with ADLs though has been needing assist with ADLs since recent hospital admissions. Pt currently needs mod A +2 for safety to stand from regular seat heights, +1 mod A to stand from elevated seat. Max A for pericare in standing position. Took pivotal steps with min A to go from recliner to EOB. Mod A for bed mobility. Full session details below.        Recommendations for follow up therapy are one component of a multi-disciplinary discharge planning process, led by the attending physician.  Recommendations may be updated based on patient status, additional functional criteria and insurance authorization.   Follow Up Recommendations  SNF    Equipment Recommendations  Other (comment) (defer to next venue)    Recommendations for Other Services       Precautions / Restrictions Precautions Precautions: Back Precaution Booklet Issued: No Precaution Comments: Thoracic Required Braces or Orthoses: Spinal Brace Spinal Brace: Applied in sitting position;Other (comment) Other Brace:  Clamshell Restrictions Weight Bearing Restrictions: No      Mobility Bed Mobility Overal bed mobility: Needs Assistance Bed Mobility: Sit to Supine Rolling: Min assist Sidelying to sit: Min assist   Sit to supine: Mod assist   General bed mobility comments: cued for logroll technique but ultimately pt completed as sit>supine. assist to advance BLE onto bed.    Transfers Overall transfer level: Needs assistance Equipment used: Rolling walker (2 wheeled) Transfers: Sit to/from [facility] Sit to Stand: Mod assist Stand pivot transfers: Min assist       General transfer comment: mod A +2 safety to stand from recliner height. Assist to powerup to standing, min A to control descent onto EOB. Extra time and effort to first scoot hips forward and come into upright sitting position.    Balance Overall balance assessment: Needs assistance Sitting-balance support: Bilateral upper extremity supported;Feet supported Sitting balance-Leahy Scale: Poor Sitting balance - Comments: strong posterior bias due to back pain with attempts to flex hips and bring torso forward Postural control: Posterior lean Standing balance support: Bilateral upper extremity supported;During functional activity Standing balance-Leahy Scale: Poor                             ADL either performed or assessed with clinical judgement   ADL Overall ADL's : Needs assistance/impaired Eating/Feeding: Set up;Sitting   Grooming: Set up;Sitting   Upper Body Bathing: Moderate assistance;Sitting   Lower Body Bathing: Maximal assistance;Sit to/from stand   Upper Body Dressing : Sitting;Moderate assistance   Lower Body Dressing: Maximal assistance;Sit to/from stand   Toilet Transfer: Moderate assistance;Minimal assistance;Stand-pivot;+2 for safety/equipment;RW;BSC Toilet Transfer Details (indicate cue  type and reason): +2 for safety from regluar seat height, +1 from elevated seat  height Toileting- Clothing Manipulation and Hygiene: Maximal assistance;Sit to/from stand         General ADL Comments: Pt needs supported sitting for UB ADLs. Needs BUE support in static standing. Pericare completed in standing position, min guard to min A for steadying, total A for pericare tasks.     Vision         Perception     Praxis      Pertinent Vitals/Pain Pain Assessment: Faces Pain Score: 8  Faces Pain Scale: Hurts little more Pain Location: Thoracic spine Pain Descriptors / Indicators: Aching;Tender;Discomfort Pain Intervention(s): Limited activity within patient's tolerance;Monitored during session;Repositioned     Hand Dominance Right   Extremity/Trunk Assessment Upper Extremity Assessment Upper Extremity Assessment: Generalized weakness   Lower Extremity Assessment Lower Extremity Assessment: Defer to PT evaluation RLE Deficits / Details: Grossly 3-/5 LLE Deficits / Details: Grossly 3-/5   Cervical / Trunk Assessment Cervical / Trunk Assessment: Other exceptions Cervical / Trunk Exceptions: in sitting, limits hip flexion due to pain in back and leans posteriorly   Communication Communication Communication: No difficulties   Cognition Arousal/Alertness: Awake/alert Behavior During Therapy: WFL for tasks assessed/performed Overall Cognitive Status: Within Functional Limits for tasks assessed                                 General Comments: tangential responses   General Comments  Pt. expresses wanting to amb again later in day.  Pt. is encouraged to ask NSG/PCT if he can take a few steps around bed prior to getting back in when it is time to transfer BTB.    Exercises     Shoulder Instructions      Home Living Family/patient expects to be discharged to:: Other (Comment)                                 Additional Comments: Pt. has been in rehab for last 3 weeks, would like to return and finish rehab prior to going  back home      Prior Functioning/Environment Level of Independence: Needs assistance  Gait / Transfers Assistance Needed: Pt. states level of assistance needed over last 3 weeks has been fluctuating, but has been able to amb in halls at times with use of RW and min A ADL's / Homemaking Assistance Needed: Pt. has been having assist with ADLs in rehab   Comments: Can drive, but tends to have others drive and do shopping for him. uses rollator indoors and SPC outdoors. Used to work at a post office.        OT Problem List: Decreased strength;Impaired balance (sitting and/or standing);Decreased activity tolerance;Decreased knowledge of use of DME or AE;Decreased knowledge of precautions;Pain      OT Treatment/Interventions: Self-care/ADL training;Therapeutic exercise;DME and/or AE instruction;Therapeutic activities;Patient/family education;Balance training    OT Goals(Current goals can be found in the care plan section) Acute Rehab OT Goals Patient Stated Goal: Pt's goal is to return to rehab to get stronger OT Goal Formulation: With patient Time For Goal Achievement: 03/14/21 Potential to Achieve Goals: Good ADL Goals Pt Will Perform Upper Body Dressing: with set-up;with supervision;sitting Pt Will Perform Lower Body Dressing: sit to/from stand;with min assist Pt Will Transfer to Toilet: with min assist;ambulating;stand pivot transfer Pt Will Perform Toileting -  Clothing Manipulation and hygiene: with min assist;sit to/from stand Additional ADL Goal #1: Pt will complete bed mobility at min guard level to prepare for EOB/OOB ADLs.  OT Frequency: Min 2X/week   Barriers to D/C:            Co-evaluation              AM-PAC OT "6 Clicks" Daily Activity     Outcome Measure Help from another person eating meals?: None Help from another person taking care of personal grooming?: A Little Help from another person toileting, which includes using toliet, bedpan, or urinal?: A  Lot Help from another person bathing (including washing, rinsing, drying)?: A Lot Help from another person to put on and taking off regular upper body clothing?: A Little Help from another person to put on and taking off regular lower body clothing?: A Lot 6 Click Score: 16   End of Session Equipment Utilized During Treatment: Rolling walker;Back brace Nurse Communication: Other (comment) (NT helped during sit>stand from recliner)  Activity Tolerance: Patient limited by pain Patient left: in bed;with call bell/phone within reach;with bed alarm set  OT Visit Diagnosis: Unsteadiness on feet (R26.81);Other abnormalities of gait and mobility (R26.89);Muscle weakness (generalized) (M62.81);Other symptoms and signs involving the nervous system (R29.898);Pain                Time: 5670-1410 OT Time Calculation (min): 20 min Charges:  OT General Charges $OT Visit: 1 Visit OT Evaluation $OT Eval Moderate Complexity: [place], OT Acute Rehabilitation Services Pager: [phone] Office: [phone]   Hortencia Pilar 02/28/2021, 1:22 PM

## 2021-02-28 NOTE — Progress Notes (Signed)
Subjective: Patient reports no significant pain  Objective: Vital signs in last 24 hours: Temp:  [97.9 F (36.6 C)-98.8 F (37.1 C)] 98.5 F (36.9 C) (10/20 0805) Pulse Rate:  [80-92] 83 (10/20 0805) Resp:  [19-20] 19 (10/20 0805) BP: (150-170)/(90-98) 170/98 (10/20 0805) SpO2:  [97 %-100 %] 100 % (10/20 0805)  Intake/Output from previous day: 10/19 0701 - 10/20 0700 In: 3503.7 [I.V.:3038.7; IV Piggyback:465] Out: 300 [Urine:300] Intake/Output this shift: Total I/O In: 220 [P.O.:220] Out: 250 [Urine:250]  NAD Deconditioned, 4+/5 strength throughout Wound vac in place  Lab Results: Recent Labs    02/25/21 1715  WBC 5.9  HGB 9.2*  HCT 29.3*  PLT 336   BMET Recent Labs    02/25/21 1715  NA 137  K 3.9  CL 105  CO2 24  GLUCOSE 95  BUN 20  CREATININE 1.01  CALCIUM 9.3    Studies/Results: [place] EKG SITE RITE  Result Date: 02/27/2021 If Site Rite image not attached, placement could not be confirmed due to current cardiac rhythm.   Assessment/Plan: 60 yo M with hx of T10-iliac fusion with small wound dehiscence s/p exploration and wound vac - f/u cultures - cont atbx - wound vac change tomorrow   Vallarie Mare 02/28/2021, 10:11 AM

## 2021-02-28 NOTE — Anesthesia Postprocedure Evaluation (Signed)
Anesthesia Post Note  Patient: Keith Dixon  Procedure(s) Performed: LUMBAR WOUND DEBRIDEMENT APPLICATION OF WOUND VAC     Patient location during evaluation: PACU Anesthesia Type: General Level of consciousness: awake Pain management: pain level controlled Vital Signs Assessment: post-procedure vital signs reviewed and stable Respiratory status: spontaneous breathing Cardiovascular status: stable Postop Assessment: no apparent nausea or vomiting Anesthetic complications: no   No notable events documented.  Last Vitals:  Vitals:   02/28/21 0805 02/28/21 1548  BP: (!) 170/98 (!) 179/99  Pulse: 83 87  Resp: 19 19  Temp: 36.9 C 36.7 C  SpO2: 100% 100%    Last Pain:  Vitals:   02/28/21 1548  TempSrc: Oral  PainSc:                  Ayodele Sangalang

## 2021-02-28 NOTE — Consult Note (Addendum)
WOC Nurse Consult Note: Reason for Consult:left heel pressure injury, POA Wound type:pressure vs reperfusion Pressure Injury POA: Yes Measurement:To be measured by Bedside RN S. Mahat today (Requested via Secure Chat) and documented on the Nursing Flow Sheet. Wound bed: [place] discoloration Drainage (amount, consistency, odor) None Periwound:intact Dressing procedure/placement/frequency: Guidance is provided for Nursing for the patient's heels to be floated, and for a silicone bordered foam dressing to be applied to the bilateral heels. This is to be peeled down and the skin assessed twice daily. Patient already has a Prevalon pressure redistribution heel boot for the left heel and it is in use.   [place] nursing team will follow for the , but will remain available to this patient, the nursing and medical teams.  Please re-consult if needed. Thanks, Maudie Flakes, MSN, RN, [place], Arther Abbott  Pager# [phone]  Thanks, Maudie Flakes, MSN, RN, [place], Arther Abbott  Pager# [phone]

## 2021-03-01 DIAGNOSIS — T8142XA Infection following a procedure, deep incisional surgical site, initial encounter: Secondary | ICD-10-CM | POA: Diagnosis not present

## 2021-03-01 LAB — BASIC METABOLIC PANEL
Anion gap: 4 — ABNORMAL LOW (ref 5–15)
BUN: 14 mg/dL (ref 6–20)
CO2: 26 mmol/L (ref 22–32)
Calcium: 8.9 mg/dL (ref 8.9–10.3)
Chloride: 109 mmol/L (ref 98–111)
Creatinine, Ser: 0.95 mg/dL (ref 0.61–1.24)
GFR, Estimated: >60 mL/min (ref >60)
Glucose, Bld: 97 mg/dL (ref 70–99)
Potassium: 4.2 mmol/L (ref 3.5–5.1)
Sodium: 139 mmol/L (ref 135–145)

## 2021-03-01 LAB — AEROBIC CULTURE W GRAM STAIN (SUPERFICIAL SPECIMEN): Culture: NO GROWTH

## 2021-03-01 LAB — CBC
HCT: 26.5 % — ABNORMAL LOW (ref 39.0–52.0)
Hemoglobin: 8 g/dL — ABNORMAL LOW (ref 13.0–17.0)
MCH: 25.6 pg — ABNORMAL LOW (ref 26.0–34.0)
MCHC: 30.2 g/dL (ref 30.0–36.0)
MCV: 84.7 fL (ref 80.0–100.0)
Platelets: 272 10*3/uL (ref 150–400)
RBC: 3.13 MIL/uL — ABNORMAL LOW (ref 4.22–5.81)
RDW: 17.5 % — ABNORMAL HIGH (ref 11.5–15.5)
WBC: 5.2 10*3/uL (ref 4.0–10.5)
nRBC: 0 % (ref 0.0–0.2)

## 2021-03-01 NOTE — Progress Notes (Signed)
Subjective: NAEs o/n  Objective: Vital signs in last 24 hours: Temp:  [97.8 F (36.6 C)-98.5 F (36.9 C)] 97.8 F (36.6 C) (10/21 0723) Pulse Rate:  [78-87] 78 (10/21 0723) Resp:  [16-19] 18 (10/21 0723) BP: (162-179)/(89-99) 162/89 (10/21 0723) SpO2:  [100 %] 100 % (10/21 0723)  Intake/Output from previous day: 10/20 0701 - 10/21 0700 In: 3774.1 [P.O.:1160; I.V.:2514.1; IV Piggyback:100] Out: 1225 [Urine:1225] Intake/Output this shift: No intake/output data recorded.  Awake, alert, NAD Full strength in Les Wound vac in place Incisions c/d otw  Lab Results: Recent Labs    03/01/21 0416  WBC 5.2  HGB 8.0*  HCT 26.5*  PLT 272   BMET Recent Labs    03/01/21 0416  NA 139  K 4.2  CL 109  CO2 26  GLUCOSE 97  BUN 14  CREATININE 0.95  CALCIUM 8.9    Studies/Results: [place] EKG SITE RITE  Result Date: 02/27/2021 If Site Rite image not attached, placement could not be confirmed due to current cardiac rhythm.   Assessment/Plan: S/p wound vac for wound dehiscence - f/u cultures - cont atbx - wound vac change today  Vallarie Mare 03/01/2021, 10:54 AM

## 2021-03-01 NOTE — Consult Note (Signed)
[place] Nurse Consult Note: Reason for Consult: Routine change of NPWT dressing placed in OR on Tuesday.  Begin M/W/F dressing changes next week. Patient was premedicated for pain by Bedside RN who also assists with getting patient back to bed and with dressing change today. Wound type: Surgical debridement (post) Patient also with pressure injury to left heel, Unstageable.  with movable but firmly adherent eschar. Pressure Injury POA: Yes Measurement: Surgical wound at lumbar spine measures 7.5cm x 3cm x 2.5cm Left heel wound measures 3.8cm round with eschar in center measuring 3cm round. Wound bed: Lumbar is beefy red, moist with scant bleeding Left heel is with soft black eschar, movable with slight release at periphery. Small light yellow. Drainage (amount, consistency, odor) As noted above Periwound:intact Dressing procedure/placement/frequency: Lumbar: ONE piece of black foam removed and wound cleansed with NS. ONE piece of black foam replaced and covered with drape. Dressing is attached to 172mmHg continuous negative pressure and an immediate seal is achieved. Patient tolerated procedure well. The cannister is replaced as it is 3/4 full with serosanguinous drainage. Left heel:  Wound cleansed with betadine prep swab and a sterile scissors and pickup used to pick away at adherent edge of eschar and cross hatch center. It is still too adherent to remove.  A NS dampened and opened gauze is placed, topped with a dry gauze and secured with a silicone foam bordered foam for the heel. Nursing staff will place foot into Prevalon boot momentarily.   Next NPWT dressing change is Monday, 10/24. Two dressing kits are in the room.   [place] nursing team will follow, and will remain available to this patient, the nursing and medical teams.   Thanks, Maudie Flakes, MSN, RN, [place], Arther Abbott  Pager# [phone]
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
## 2021-03-01 NOTE — Progress Notes (Signed)
Physical Therapy Treatment Patient Details Name: Keith Dixon MRN: [id number] DOB: [birth year] Today's Date: 03/01/2021   History of Present Illness Pt is a 60 y.o. male who was admitted to [facility] on 10/17 for possible spinal infection.  MRI (+) for post-op fluid collection/infection from T6-T11. Underwent wound exploration/debridement on 10/18. 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 Demos improved LE strength and activity tolerance today, and is able to amb inc distance in halls with CGA.  Pt. Continues to demo greatest difficulty with sit > stand, requiring mod A to complete transition and prevent LOB.  AMPAC score continues to indicate home with HH, however, pt. Is alone most of day with full flight of stairs to get into home and would benefit from short term rehab prior to return home.  In order to safely return home at this time, pt. Would require medical transport for flight of stairs, mod A for transfers, min A for bed mobility and donning brace., and A with ADLs.  Recommendations for follow up therapy are one component of a multi-disciplinary discharge planning process, led by the attending physician.  Recommendations may be updated based on patient status, additional functional criteria and insurance authorization.  Follow Up Recommendations  Other (comment) (Wants to return to same rehab center where he was recovering prior to admission)     Equipment Recommendations  Rolling walker with 5" wheels    Recommendations for Other Services       Precautions / Restrictions Precautions Precautions: Back Required Braces or Orthoses: Other Brace Spinal Brace Comments: Clamshell, apply in sitting Restrictions Weight  Bearing Restrictions: No     Mobility  Bed Mobility Overal bed mobility: Needs Assistance Bed Mobility: Rolling;Sidelying to Sit Rolling: Min assist Sidelying to sit: Min assist       General bed mobility comments: Pt. performs log roll and SL > sit with min A for trunk support. Patient Response: Cooperative  Transfers   Equipment used: Conservation officer, nature (2 wheeled)   Sit to Stand: Mod assist         General transfer comment: Pt. performs sit > stand from elevated bed height with mod A.  Demos improved form today with transfer technique with dec lateral lean.  Ambulation/Gait Ambulation/Gait assistance: Min guard Gait Distance (Feet): 215 Feet Assistive device: Rolling walker (2 wheeled) Gait Pattern/deviations: Trunk flexed     General Gait Details: Pt. amb in halls with CGA for safety.  Chair follow is used of safety. Needs intermittent VCs to improve upright posture and dec trunk flex.  Pt. begins to demos tremors from fatigue at ~175 ft, but wants to continue to amb.  At 215 ft, pt. agrees to finally sit and rest.  Demos poor eccentric control with transfer into chair.   Stairs             Wheelchair Mobility    Modified Rankin (Stroke Patients Only)       Balance     Sitting balance-Leahy Scale: Poor   Postural control: Posterior lean Standing balance support: Bilateral upper extremity supported;During functional activity Standing balance-Leahy Scale: Poor  Cognition Arousal/Alertness: Awake/alert Behavior During Therapy: WFL for tasks assessed/performed Overall Cognitive Status: Within Functional Limits for tasks assessed                                 General Comments: Pt. sleeping when PT arrives.  Arouses easily.  Agreeable to work with therapy.  Excited to try to walk in halls.      Exercises      General Comments        Pertinent Vitals/Pain Pain Assessment: 0-10 Pain Score: 4   Pain Location: Back pain Pain Descriptors / Indicators: Aching;Tender;Discomfort Pain Intervention(s): Limited activity within patient's tolerance;Repositioned    Home Living                      Prior Function            PT Goals (current goals can now be found in the care plan section) Progress towards PT goals: Progressing toward goals    Frequency           PT Plan Current plan remains appropriate    Co-evaluation              AM-PAC PT "6 Clicks" Mobility   Outcome Measure  Help needed turning from your back to your side while in a flat bed without using bedrails?: A Little Help needed moving from lying on your back to sitting on the side of a flat bed without using bedrails?: A Little Help needed moving to and from a bed to a chair (including a wheelchair)?: A Little Help needed standing up from a chair using your arms (e.g., wheelchair or bedside chair)?: A Lot Help needed to walk in hospital room?: A Little Help needed climbing 3-5 steps with a railing? : A Lot 6 Click Score: 16    End of Session Equipment Utilized During Treatment: Gait belt Activity Tolerance: Patient tolerated treatment well Patient left: in chair;with call bell/phone within reach;with chair alarm set         Time: 0912-0939 PT Time Calculation (min) (ACUTE ONLY): 27 min  Charges:  $Gait Training: 8-22 mins $Therapeutic Activity: 8-22 mins                     Keith Dixon, PT, DPT Acute Rehabilitation Services Office: [place] 03/01/2021, 10:24 AM

## 2021-03-01 NOTE — Progress Notes (Signed)
INFECTIOUS DISEASE PROGRESS NOTE  ID: Keith Dixon is a 60 y.o. male with  Active Problems:   Post op infection  Subjective: No complaints  Abtx:  Anti-infectives (From admission, onward)    Start     Dose/Rate Route Frequency Ordered Stop   02/27/21 1600  cefTRIAXone (ROCEPHIN) 2 g in sodium chloride 0.9 % 100 mL IVPB        2 g 200 mL/hr over 30 Minutes Intravenous Every 24 hours 02/27/21 1205     02/26/21 1830  DAPTOmycin (CUBICIN) 750 mg in sodium chloride 0.9 % IVPB  Status:  Discontinued        725 mg 130 mL/hr over 30 Minutes Intravenous Daily 02/26/21 1749 02/26/21 1754   02/26/21 1830  DAPTOmycin (CUBICIN) 750 mg in sodium chloride 0.9 % IVPB  Status:  Discontinued        8 mg/kg  90.7 kg 130 mL/hr over 30 Minutes Intravenous Daily 02/26/21 1754 02/27/21 1205   02/26/21 1800  ceFEPIme (MAXIPIME) 2 g in sodium chloride 0.9 % 100 mL IVPB  Status:  Discontinued        2 g 200 mL/hr over 30 Minutes Intravenous Every 8 hours 02/26/21 1733 02/27/21 1205   02/26/21 1500  ceFAZolin (ANCEF) IVPB 2g/100 mL premix        2 g 200 mL/hr over 30 Minutes Intravenous  Once 02/26/21 1450 02/26/21 1521   02/26/21 1450  ceFAZolin (ANCEF) 2-4 GM/100ML-% IVPB       Note to Pharmacy: Cameron Sprang   : cabinet override      02/26/21 1450 02/26/21 1534       Medications: Scheduled:  aspirin  81 mg Oral Daily   Chlorhexidine Gluconate Cloth  6 each Topical Daily   enoxaparin  40 mg Subcutaneous Q24H   feeding supplement  237 mL Oral TID   folic acid  1 mg Oral Daily   gabapentin  300 mg Oral BID   hydrOXYzine  50 mg Oral Q12H   lamoTRIgine  100 mg Oral BID   levETIRAcetam  1,500 mg Oral BID   magnesium oxide  400 mg Oral Daily   pantoprazole  40 mg Oral QHS   QUEtiapine  200 mg Oral QHS   sertraline  100 mg Oral QPC breakfast   sodium bicarbonate  650 mg Oral TID   sodium chloride flush  10-40 mL Intracatheter Q12H   cyanocobalamin  1,000 mcg Oral Daily   zinc sulfate   220 mg Oral Daily    Objective: Vital signs in last 24 hours: Temp:  [97.8 F (36.6 C)-98.5 F (36.9 C)] 97.8 F (36.6 C) (10/21 0723) Pulse Rate:  [78-87] 78 (10/21 0723) Resp:  [16-19] 18 (10/21 0723) BP: (162-179)/(89-99) 162/89 (10/21 0723) SpO2:  [100 %] 100 % (10/21 0723)   General appearance: alert, cooperative, and no distress Resp: clear to auscultation bilaterally Cardio: regular rate and rhythm GI: normal findings: bowel sounds normal and soft, non-tender Incision/Wound: vac in place, clean.   Lab Results Recent Labs    03/01/21 0416  WBC 5.2  HGB 8.0*  HCT 26.5*  NA 139  K 4.2  CL 109  CO2 26  BUN 14  CREATININE 0.95   Liver Panel No results for input(s): PROT, ALBUMIN, AST, ALT, ALKPHOS, BILITOT, BILIDIR, IBILI in the last 72 hours. Sedimentation Rate No results for input(s): ESRSEDRATE in the last 72 hours. C-Reactive Protein No results for input(s): CRP in the last 72 hours.  Microbiology: Recent Results (from the past 240 hour(s))  Resp Panel by RT-PCR (Flu A&B, Covid) Nasopharyngeal Swab     Status: None   Collection Time: 02/26/21  5:34 AM   Specimen: Nasopharyngeal Swab; Nasopharyngeal(NP) swabs in vial transport medium  Result Value Ref Range Status   SARS Coronavirus 2 by RT PCR NEGATIVE NEGATIVE Final    Comment: (NOTE) SARS-CoV-2 target nucleic acids are NOT DETECTED.  The SARS-CoV-2 RNA is generally detectable in upper respiratory specimens during the acute phase of infection. The lowest concentration of SARS-CoV-2 viral copies this assay can detect is 138 copies/mL. A negative result does not preclude SARS-Cov-2 infection and should not be used as the sole basis for treatment or other patient management decisions. A negative result may occur with  improper specimen collection/handling, submission of specimen other than nasopharyngeal swab, presence of viral mutation(s) within the areas targeted by this assay, and inadequate number  of viral copies(<138 copies/mL). A negative result must be combined with clinical observations, patient history, and epidemiological information. The expected result is Negative.  Fact Sheet for Patients:  EntrepreneurPulse.com.au  Fact Sheet for Healthcare Providers:  IncredibleEmployment.be  This test is no t yet approved or cleared by the [place] FDA and  has been authorized for detection and/or diagnosis of SARS-CoV-2 by FDA under an Emergency Use Authorization (EUA). This EUA will remain  in effect (meaning this test can be used) for the duration of the COVID-19 declaration under Section 564(b)(1) of the Act, 21 U.S.C.section 360bbb-3(b)(1), unless the authorization is terminated  or revoked sooner.       Influenza A by PCR NEGATIVE NEGATIVE Final   Influenza B by PCR NEGATIVE NEGATIVE Final    Comment: (NOTE) The Xpert Xpress SARS-CoV-2/FLU/RSV plus assay is intended as an aid in the diagnosis of influenza from Nasopharyngeal swab specimens and should not be used as a sole basis for treatment. Nasal washings and aspirates are unacceptable for Xpert Xpress SARS-CoV-2/FLU/RSV testing.  Fact Sheet for Patients: EntrepreneurPulse.com.au  Fact Sheet for Healthcare Providers: IncredibleEmployment.be  This test is not yet approved or cleared by the [place] FDA and has been authorized for detection and/or diagnosis of SARS-CoV-2 by FDA under an Emergency Use Authorization (EUA). This EUA will remain in effect (meaning this test can be used) for the duration of the COVID-19 declaration under Section 564(b)(1) of the Act, 21 U.S.C. section 360bbb-3(b)(1), unless the authorization is terminated or revoked.  Performed at [facility], [place] [address] [place], [place] [address]   Aerobic/Anaerobic Culture w Gram Stain (surgical/deep wound)     Status: None (Preliminary result)   Collection  Time: 02/26/21  4:02 PM   Specimen: PATH Other; Body Fluid  Result Value Ref Range Status   Specimen Description WOUND  Final   Special Requests SUBFASICAL FLUID SWAB  Final   Gram Stain NO WBC SEEN NO ORGANISMS SEEN   Final   Culture   Final    NO GROWTH 2 DAYS NO ANAEROBES ISOLATED; CULTURE IN PROGRESS FOR 5 DAYS Performed at [facility], [address], [place], [place] [address]    Report Status PENDING  Incomplete  Anaerobic culture w Gram Stain     Status: None (Preliminary result)   Collection Time: 02/26/21  4:11 PM   Specimen: PATH Other; Tissue  Result Value Ref Range Status   Specimen Description TISSUE  Final   Special Requests  SUBFASICAL TISSUE  Final   Gram Stain  Final    ABUNDANT WBC PRESENT,BOTH PMN AND MONONUCLEAR NO ORGANISMS SEEN Performed at [facility], [place] [address] [place], [place] [address]    Culture PENDING  Incomplete   Report Status PENDING  Incomplete  Aerobic Culture w Gram Stain (superficial specimen)     Status: None (Preliminary result)   Collection Time: 02/26/21  4:11 PM   Specimen: Tissue  Result Value Ref Range Status   Specimen Description TISSUE  Final   Special Requests SUBFASICAL TISSUES SPEC B  Final   Gram Stain   Final    ABUNDANT WBC PRESENT,BOTH PMN AND MONONUCLEAR NO ORGANISMS SEEN    Culture   Final    NO GROWTH 1 DAY Performed at [facility], [place] [address] [place], [place] [address]    Report Status PENDING  Incomplete    Studies/Results: No results found.   Assessment/Plan: Spinal fusion (8-3/4-22) Wound infection (prev Klebsiella) HTN Anemia   Total days of antibiotics: 3 ceftriaxone   S/p transfusion, has again dropped.  Await his op Cx, still incubating.  No change in ceftriaxone for now.  Dr Juleen China is available if questions over w/e.          Bobby Rumpf MD, FACP Infectious Diseases (pager) [phone] [email or web address] 03/01/2021, 12:37 PM  LOS: 3 days

## 2021-03-01 NOTE — Care Management Important Message (Signed)
Important Message  Patient Details  Name: Keith Dixon MRN: [id number] Date of Birth: 12/18/1960   Medicare Important Message Given:  Yes     Orbie Pyo 03/01/2021, 3:57 PM

## 2021-03-02 NOTE — Progress Notes (Signed)
Subjective: Patient reports no significant discomfort  Objective: Vital signs in last 24 hours: Temp:  [97.7 F (36.5 C)-99 F (37.2 C)] 98.5 F (36.9 C) (10/22 0828) Pulse Rate:  [78-82] 82 (10/22 0828) Resp:  [17-18] 18 (10/22 0828) BP: (158-172)/(87-101) 159/89 (10/22 0828) SpO2:  [99 %-100 %] 100 % (10/22 0828)  Intake/Output from previous day: 10/21 0701 - 10/22 0700 In: 2370 [P.O.:1020; I.V.:1250; IV Piggyback:100] Out: 1952 [Urine:1951; Stool:1] Intake/Output this shift: Total I/O In: -  Out: 400 [Urine:400] Awake, alert Wound vac in place FC x 4  Lab Results: Recent Labs    03/01/21 0416  WBC 5.2  HGB 8.0*  HCT 26.5*  PLT 272   BMET Recent Labs    03/01/21 0416  NA 139  K 4.2  CL 109  CO2 26  GLUCOSE 97  BUN 14  CREATININE 0.95  CALCIUM 8.9    Studies/Results: No results found.  Assessment/Plan: S/p wound vac placement - wound vac change Monday - awaiting final cultures to finalize antibiotics; possible DC Monday or Tuesday   Vallarie Mare 03/02/2021, 10:53 AM

## 2021-03-03 MED ORDER — LABETALOL HCL 100 MG PO TABS
100.0000 mg | ORAL_TABLET | Freq: Two times a day (BID) | ORAL | Status: DC
  Administered 2021-03-03 – 2021-03-08 (×10): 100 mg via ORAL
  Filled 2021-03-03 (×10): qty 1

## 2021-03-03 NOTE — Progress Notes (Signed)
Patient's BP was 185/97 at 16:39. Contacted on call neuro doctor Kristeen Miss. Labetalol was ordered BID 100mg 

## 2021-03-03 NOTE — Progress Notes (Signed)
Occupational Therapy Treatment Patient Details Name: Keith Dixon MRN: [id number] DOB: 1960-10-01 Today's Date: 03/03/2021   History of present illness Pt is a 60 y.o. male who was admitted to [facility] on 10/17 for possible spinal infection.  MRI (+) for post-op fluid collection/infection from T6-T11. Underwent wound exploration/debridement on 10/18. 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 Pt remains limited by pain, balance, and activity tolerance. During session, pt completed donned socks with Max A and completed simulated toilet transfer from EOB to recliner with Min A. Pt remains appropriate for SNF at d/c. Will continue to follow acutely.   Recommendations for follow up therapy are one component of a multi-disciplinary discharge planning process, led by the attending physician.  Recommendations may be updated based on patient status, additional functional criteria and insurance authorization.    Follow Up Recommendations  SNF    Equipment Recommendations  Other (comment) (TBD at next venue of care)    Recommendations for Other Services      Precautions / Restrictions Precautions Precautions: Back Required Braces or Orthoses: Other Brace Spinal Brace: Thoracolumbosacral orthotic;Applied in sitting position Restrictions Weight Bearing Restrictions: No       Mobility Bed Mobility Overal bed mobility: Needs Assistance Bed Mobility: Rolling;Sidelying to Sit Rolling: Min assist Sidelying to sit: Min assist            Transfers Overall transfer level: Needs assistance Equipment used: Rolling walker (2 wheeled) Transfers: Sit to/from [facility] Sit to Stand:  Min assist;From elevated surface Stand pivot transfers: Min assist            Balance Overall balance assessment: Needs assistance Sitting-balance support: Bilateral upper extremity supported;Feet supported Sitting balance-Leahy Scale: Poor     Standing balance support: Bilateral upper extremity supported;During functional activity Standing balance-Leahy Scale: Poor                             ADL either performed or assessed with clinical judgement   ADL Overall ADL's : Needs assistance/impaired                     Lower Body Dressing: Maximal assistance Lower Body Dressing Details (indicate cue type and reason): to don socks Toilet Transfer: Minimal assistance;BSC;RW;Stand-pivot Toilet Transfer Details (indicate cue type and reason): simulated transfer to recliner with Min A from EOB (elevated)           General ADL Comments: Pt limited by pain, balance, and activity toelrance     Vision       Perception     Praxis      Cognition Arousal/Alertness: Awake/alert Behavior During Therapy: WFL for tasks assessed/performed Overall Cognitive Status: Within Functional Limits for tasks assessed                                          Exercises     Shoulder Instructions       General Comments      Pertinent Vitals/ Pain       Pain Assessment: 0-10 Pain Score: 10-Worst pain ever Pain Location:  Back pain Pain Descriptors / Indicators: Aching;Tender;Discomfort Pain Intervention(s): Limited activity within patient's tolerance;Monitored during session;Repositioned  Home Living                                          Prior Functioning/Environment              Frequency  Min 2X/week        Progress Toward Goals  OT Goals(current goals can now be found in the care plan section)  Progress towards OT goals: Progressing toward goals  Acute Rehab OT Goals Patient Stated Goal: Pt's goal is to  return to rehab to get stronger OT Goal Formulation: With patient Time For Goal Achievement: 03/14/21 Potential to Achieve Goals: Good ADL Goals Pt Will Perform Upper Body Dressing: with set-up;with supervision;sitting Pt Will Perform Lower Body Dressing: sit to/from stand;with min assist Pt Will Transfer to Toilet: with min assist;ambulating;stand pivot transfer Pt Will Perform Toileting - Clothing Manipulation and hygiene: with min assist;sit to/from stand Additional ADL Goal #1: Pt will complete bed mobility at min guard level to prepare for EOB/OOB ADLs.  Plan Discharge plan remains appropriate;Frequency remains appropriate    Co-evaluation                 AM-PAC OT "6 Clicks" Daily Activity     Outcome Measure   Help from another person eating meals?: None Help from another person taking care of personal grooming?: A Little Help from another person toileting, which includes using toliet, bedpan, or urinal?: A Lot Help from another person bathing (including washing, rinsing, drying)?: A Lot Help from another person to put on and taking off regular upper body clothing?: A Little Help from another person to put on and taking off regular lower body clothing?: A Lot 6 Click Score: 16    End of Session Equipment Utilized During Treatment: Gait belt;Rolling walker;Back brace  OT Visit Diagnosis: Unsteadiness on feet (R26.81);Other abnormalities of gait and mobility (R26.89);Muscle weakness (generalized) (M62.81);Other symptoms and signs involving the nervous system (R29.898);Pain   Activity Tolerance Patient limited by pain   Patient Left in chair;with call bell/phone within reach;with chair alarm set   Nurse Communication Mobility status        Time: 5997-7414 OT Time Calculation (min): 17 min  Charges: OT General Charges $OT Visit: 1 Visit OT Treatments $Self Care/Home Management : 8-22 mins  Cerys Winget C, OT/L  Acute Rehab [place] 03/03/2021, 5:18 PM

## 2021-03-03 NOTE — Progress Notes (Signed)
Subjective: Patient reports no changes  Objective: Vital signs in last 24 hours: Temp:  [98.2 F (36.8 C)-98.9 F (37.2 C)] 98.3 F (36.8 C) (10/23 0832) Pulse Rate:  [81-84] 81 (10/23 0832) Resp:  [18] 18 (10/23 0832) BP: (167-180)/(92-99) 174/97 (10/23 0832) SpO2:  [100 %] 100 % (10/23 0832)  Intake/Output from previous day: 10/22 0701 - 10/23 0700 In: 4111.3 [P.O.:620; I.V.:3491.3] Out: 1475 [Urine:1475] Intake/Output this shift: Total I/O In: 240 [P.O.:240] Out: 550 [Urine:550]  Awake, alert Wound vac in place FC x 4  Lab Results: Recent Labs    03/01/21 0416  WBC 5.2  HGB 8.0*  HCT 26.5*  PLT 272   BMET Recent Labs    03/01/21 0416  NA 139  K 4.2  CL 109  CO2 26  GLUCOSE 97  BUN 14  CREATININE 0.95  CALCIUM 8.9    Studies/Results: No results found.  Assessment/Plan: S/p T10-pelvis fusion with wound dehiscence s/p wound vac - cultures with NGTD - cont ceftriaxone - lytes and creatinine have been stable   Vallarie Mare 03/03/2021, 11:37 AM

## 2021-03-04 DIAGNOSIS — Z5320 Procedure and treatment not carried out because of patient's decision for unspecified reasons: Secondary | ICD-10-CM

## 2021-03-04 DIAGNOSIS — Z981 Arthrodesis status: Secondary | ICD-10-CM

## 2021-03-04 DIAGNOSIS — A498 Other bacterial infections of unspecified site: Secondary | ICD-10-CM

## 2021-03-04 DIAGNOSIS — Z114 Encounter for screening for human immunodeficiency virus [HIV]: Secondary | ICD-10-CM

## 2021-03-04 LAB — CBC
HCT: 27.3 % — ABNORMAL LOW (ref 39.0–52.0)
Hemoglobin: 8.4 g/dL — ABNORMAL LOW (ref 13.0–17.0)
MCH: 25.5 pg — ABNORMAL LOW (ref 26.0–34.0)
MCHC: 30.8 g/dL (ref 30.0–36.0)
MCV: 83 fL (ref 80.0–100.0)
Platelets: 289 10*3/uL (ref 150–400)
RBC: 3.29 MIL/uL — ABNORMAL LOW (ref 4.22–5.81)
RDW: 17.7 % — ABNORMAL HIGH (ref 11.5–15.5)
WBC: 5.8 10*3/uL (ref 4.0–10.5)
nRBC: 0 % (ref 0.0–0.2)

## 2021-03-04 LAB — AEROBIC/ANAEROBIC CULTURE W GRAM STAIN (SURGICAL/DEEP WOUND)
Culture: NO GROWTH
Gram Stain: NONE SEEN

## 2021-03-04 LAB — ANAEROBIC CULTURE W GRAM STAIN

## 2021-03-04 LAB — BASIC METABOLIC PANEL
Anion gap: 8 (ref 5–15)
BUN: 11 mg/dL (ref 6–20)
CO2: 27 mmol/L (ref 22–32)
Calcium: 8.9 mg/dL (ref 8.9–10.3)
Chloride: 104 mmol/L (ref 98–111)
Creatinine, Ser: 0.84 mg/dL (ref 0.61–1.24)
GFR, Estimated: >60 mL/min (ref >60)
Glucose, Bld: 112 mg/dL — ABNORMAL HIGH (ref 70–99)
Potassium: 3.7 mmol/L (ref 3.5–5.1)
Sodium: 139 mmol/L (ref 135–145)

## 2021-03-04 NOTE — Progress Notes (Signed)
Subjective: Patient reports no changes  Objective: Vital signs in last 24 hours: Temp:  [98.3 F (36.8 C)-99 F (37.2 C)] 98.4 F (36.9 C) (10/24 1610) Pulse Rate:  [80-85] 80 (10/24 1610) Resp:  [18-19] 19 (10/24 1610) BP: (129-168)/(87-95) 157/89 (10/24 1610) SpO2:  [97 %-100 %] 98 % (10/24 1610)  Intake/Output from previous day: 10/23 0701 - 10/24 0700 In: 957 [P.O.:957] Out: 1850 [Urine:1850] Intake/Output this shift: Total I/O In: 120 [P.O.:120] Out: 200 [Urine:200]  NAD Full strength in legs  Lab Results: Recent Labs    03/04/21 0426  WBC 5.8  HGB 8.4*  HCT 27.3*  PLT 289   BMET Recent Labs    03/04/21 0426  NA 139  K 3.7  CL 104  CO2 27  GLUCOSE 112*  BUN 11  CREATININE 0.84  CALCIUM 8.9    Studies/Results: No results found.  Assessment/Plan: S/p wound vac for wound dehiscence - cultures negative, but continue antibiotics given he has required two wound revisions - M/W/F vac changes   Vallarie Mare 03/04/2021, 5:33 PM

## 2021-03-04 NOTE — Progress Notes (Addendum)
  Inpatient Rehab Admissions Coordinator :  Per therapy change in recommendations, patient was screened for CIR candidacy by Danne Baxter RN MSN.  Noted patient requesting to return to same rehab he was at [facility]. If patient would like to be considered for [facility] acute inpt rehab, please place consult.. Please call me with any questions.  Danne Baxter RN MSN Admissions Coordinator [phone]    I spoke with his girlfriend/POA by phone. As before, he does not have the needed caregiver supports at home and they wish to pursue return to SNF. I have alerted TOC. We will not pursue CIR admit.  Danne Baxter, RN, MSN Rehab Admissions Coordinator [phone] 03/05/2021 10:51 AM

## 2021-03-04 NOTE — Progress Notes (Signed)
Physical Therapy Treatment Patient Details Name: KRISTOPHER ATTWOOD MRN: [id number] DOB: 05/27/1960 Today's Date: 03/04/2021   History of Present Illness Pt is a 60 y.o. male who was admitted to [facility] on 10/17 for possible spinal infection.  MRI (+) for post-op fluid collection/infection from T6-T11. Underwent wound exploration/debridement on 10/18. 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 Demos improved independence with bed mobility today, able to transfer to EOB without physical assist.  Continues to require greatest assist with sit > stand for safety and to prevent LOB.  Initiated LE therex with good tolerance, pt. Encouraged to continue therex throughout day and demos understanding.  Pt. Would benefit from continued skilled PT in acute care to address LE strength, balance and transfer difficulty.  Pt. Would be an excellent candidate for CIR.  Recommendations for follow up therapy are one component of a multi-disciplinary discharge planning process, led by the attending physician.  Recommendations may be updated based on patient status, additional functional criteria and insurance authorization.  Follow Up Recommendations  Acute inpatient rehab (3hours/day) (Pt. wants to return to rehab where he was recovering prior to admission - unsure what center this was, but pt. would be appropriate for CIR.  Highly motivated.)     Assistance Recommended at Discharge Frequent or constant Supervision/Assistance  Equipment Recommendations  Rolling walker (2 wheels)    Recommendations for Other Services       Precautions / Restrictions Precautions Precautions: Back Spinal Brace: Other (comment);Applied in sitting position Spinal Brace  Comments: Clamshell Restrictions Weight Bearing Restrictions: No     Mobility  Bed Mobility Overal bed mobility: Modified Independent Bed Mobility: Rolling;Sidelying to Sit Rolling: Modified independent (Device/Increase time) (log roll, needs VCs to improve technique) Sidelying to sit: Modified independent (Device/Increase time)       General bed mobility comments: Takes inc time but is able to complete mod I with use of bedrail.  Poor initial sitting balance.  Needs PT to adjust height of bed so pt. can better support feet under him.  Relies on B UE support to maintain balance. Patient Response: Cooperative  Transfers Overall transfer level: Needs assistance Equipment used: Rolling walker (2 wheels)   Sit to Stand: Mod assist           General transfer comment: Req's mod A to transition to standing.  VCs to lift head and transfer UE to RW to improve transition and balance with upright standing.    Ambulation/Gait Ambulation/Gait assistance: Min guard Gait Distance (Feet): 40 Feet Assistive device: Rolling walker (2 wheels) Gait Pattern/deviations: Decreased step length - right;Decreased step length - left;Trunk flexed     General Gait Details: Amb in room with CGA only.  Needs VCs to improve upright posture.  Gait distance limited at this time due to neck pain.  Additionally, mobility team is planning on coming right after PT treatment to mobilize pt. in hall, so pt. is given seated rest break before their arrival.   Stairs             Wheelchair Mobility    Modified Rankin (Stroke Patients Only)       Balance Overall balance assessment: Needs assistance Sitting-balance support: Bilateral upper extremity supported;Feet supported Sitting balance-Leahy Scale: Poor  Standing balance support: Bilateral upper extremity supported;During functional activity Standing balance-Leahy Scale: Poor                              Cognition  Arousal/Alertness: Awake/alert Behavior During Therapy: WFL for tasks assessed/performed Overall Cognitive Status: Within Functional Limits for tasks assessed                                 General Comments: Pt. supine in bed when PT arrives, family present in room.  Agreeable to work with PT  but worried about inc neck pain.        Exercises General Exercises - Lower Extremity Ankle Circles/Pumps: AROM;Both;10 reps Long Arc Quad: AROM;Both;10 reps Hip ABduction/ADduction: AROM;Both;10 reps Hip Flexion/Marching: AROM;Both;10 reps    General Comments        Pertinent Vitals/Pain Pain Assessment: 0-10 Pain Score: 8  Pain Location: Reports 8/10 neck pain from sleeping wrong, also reports inc "tightness" in back from wound vac change. Pain Descriptors / Indicators: Discomfort;Aching;Tightness Pain Intervention(s): Limited activity within patient's tolerance;Monitored during session;Repositioned    Home Living                          Prior Function            PT Goals (current goals can now be found in the care plan section) Progress towards PT goals: Progressing toward goals    Frequency           PT Plan Current plan remains appropriate    Co-evaluation              AM-PAC PT "6 Clicks" Mobility   Outcome Measure  Help needed turning from your back to your side while in a flat bed without using bedrails?: A Little Help needed moving from lying on your back to sitting on the side of a flat bed without using bedrails?: A Little Help needed moving to and from a bed to a chair (including a wheelchair)?: A Little Help needed standing up from a chair using your arms (e.g., wheelchair or bedside chair)?: A Little Help needed to walk in hospital room?: A Little Help needed climbing 3-5 steps with a railing? : A Lot 6 Click Score: 17    End of Session Equipment Utilized During Treatment: Gait belt Activity Tolerance: Patient tolerated  treatment well;Patient limited by pain Patient left: in chair;with call bell/phone within reach;with chair alarm set         Time: 2330-0762 PT Time Calculation (min) (ACUTE ONLY): 27 min  Charges:  $Gait Training: 8-22 mins $Therapeutic Exercise: 8-22 mins                     Tisheena Maguire A. Brionne Mertz, PT, DPT Acute Rehabilitation Services Office: [place] 03/04/2021, 11:36 AM

## 2021-03-04 NOTE — Progress Notes (Signed)
Mobility Specialist Progress Note:   03/04/21 1237  Mobility  Activity Transferred:  Chair to bed  Level of Assistance Moderate assist, patient does 50-74%  Assistive Device Front wheel walker  Distance Ambulated (ft) 8 ft  Mobility Ambulated with assistance in room  Mobility Response Tolerated well  Mobility performed by Mobility specialist  $Mobility charge 1 Mobility   Pt requesting to move back to bed. Mod A to stand and contact guard to EOB. Min A to get legs into bed.Pt left in bed with call bell in reach and all needs met.  [facility] Health and safety inspector Phone [phone]

## 2021-03-04 NOTE — Consult Note (Addendum)
[facility] Nurse wound follow up Wound type: thoracic spine surgical wound dehiscence with NPWT (VAC) in place.  Measurement:not assessed today  Will measure at Wednesday dressing change Wound bed: beefy red Drainage (amount, consistency, odor) moderate serosanguinous  no odor.  Periwound:intact Dressing procedure/placement/frequency: 1 piece black foam to wound bed.  Covered with drape.  Seal immediately achieved at 125 mmhg.  Change Mon/Wed/Fri.  Offered to bridge dressing to side of spine. Patient states he does not feel the [facility] pad.  No redness or irritation noted.  Will continue with TRAC pad over wound bed.    Will follow.  Domenic Moras MSN, RN, FNP-BC [facility] Wound, Ostomy, Continence Nurse Pager [phone]

## 2021-03-04 NOTE — Progress Notes (Addendum)
Subjective: No new complaints   Antibiotics:  Anti-infectives (From admission, onward)    Start     Dose/Rate Route Frequency Ordered Stop   02/27/21 1600  cefTRIAXone (ROCEPHIN) 2 g in sodium chloride 0.9 % 100 mL IVPB        2 g 200 mL/hr over 30 Minutes Intravenous Every 24 hours 02/27/21 1205     02/26/21 1830  DAPTOmycin (CUBICIN) 750 mg in sodium chloride 0.9 % IVPB  Status:  Discontinued        725 mg 130 mL/hr over 30 Minutes Intravenous Daily 02/26/21 1749 02/26/21 1754   02/26/21 1830  DAPTOmycin (CUBICIN) 750 mg in sodium chloride 0.9 % IVPB  Status:  Discontinued        8 mg/kg  90.7 kg 130 mL/hr over 30 Minutes Intravenous Daily 02/26/21 1754 02/27/21 1205   02/26/21 1800  ceFEPIme (MAXIPIME) 2 g in sodium chloride 0.9 % 100 mL IVPB  Status:  Discontinued        2 g 200 mL/hr over 30 Minutes Intravenous Every 8 hours 02/26/21 1733 02/27/21 1205   02/26/21 1500  ceFAZolin (ANCEF) IVPB 2g/100 mL premix        2 g 200 mL/hr over 30 Minutes Intravenous  Once 02/26/21 1450 02/26/21 1521   02/26/21 1450  ceFAZolin (ANCEF) 2-4 GM/100ML-% IVPB       Note to Pharmacy: Cameron Sprang   : cabinet override      02/26/21 1450 02/26/21 1534       Medications: Scheduled Meds:  aspirin  81 mg Oral Daily   Chlorhexidine Gluconate Cloth  6 each Topical Daily   enoxaparin  40 mg Subcutaneous Q24H   feeding supplement  237 mL Oral TID   folic acid  1 mg Oral Daily   gabapentin  300 mg Oral BID   hydrOXYzine  50 mg Oral Q12H   labetalol  100 mg Oral BID   lamoTRIgine  100 mg Oral BID   levETIRAcetam  1,500 mg Oral BID   magnesium oxide  400 mg Oral Daily   pantoprazole  40 mg Oral QHS   QUEtiapine  200 mg Oral QHS   sertraline  100 mg Oral QPC breakfast   sodium bicarbonate  650 mg Oral TID   sodium chloride flush  10-40 mL Intracatheter Q12H   cyanocobalamin  1,000 mcg Oral Daily   zinc sulfate  220 mg Oral Daily   Continuous Infusions:  sodium chloride  10 mL/hr at 02/27/21 1815   0.9 % NaCl with KCl 20 mEq / L 125 mL/hr at 03/03/21 0453   cefTRIAXone (ROCEPHIN)  IV 2 g (03/04/21 1621)   PRN Meds:.acetaminophen **OR** acetaminophen, cyclobenzaprine, docusate sodium, HYDROmorphone (DILAUDID) injection, menthol-cetylpyridinium **OR** phenol, ondansetron **OR** ondansetron (ZOFRAN) IV, oxyCODONE, oxyCODONE, sodium chloride flush, sodium phosphate    Objective: Weight change:   Intake/Output Summary (Last 24 hours) at 03/04/2021 1730 Last data filed at 03/04/2021 1321 Gross per 24 hour  Intake 597 ml  Output 1250 ml  Net -653 ml   Blood pressure (!) 157/89, pulse 80, temperature 98.4 F (36.9 C), temperature source Oral, resp. rate 19, height 6' 1"  (1.854 m), weight 90.7 kg, SpO2 98 %. Temp:  [98.3 F (36.8 C)-99 F (37.2 C)] 98.4 F (36.9 C) (10/24 1610) Pulse Rate:  [80-85] 80 (10/24 1610) Resp:  [18-19] 19 (10/24 1610) BP: (129-168)/(87-95) 157/89 (10/24 1610) SpO2:  [97 %-100 %] 98 % (10/24 1610)  Physical Exam: Physical Exam Constitutional:      Appearance: He is well-developed.  HENT:     Head: Normocephalic and atraumatic.  Eyes:     Conjunctiva/sclera: Conjunctivae normal.  Cardiovascular:     Rate and Rhythm: Normal rate and regular rhythm.  Pulmonary:     Effort: Pulmonary effort is normal. No respiratory distress.     Breath sounds: No wheezing.  Abdominal:     General: There is no distension.     Palpations: Abdomen is soft.  Musculoskeletal:        General: Normal range of motion.     Cervical back: Normal range of motion and neck supple.  Skin:    General: Skin is warm and dry.     Findings: No erythema or rash.  Neurological:     General: No focal deficit present.     Mental Status: He is alert and oriented to person, place, and time.  Psychiatric:        Mood and Affect: Mood normal.        Behavior: Behavior normal.        Thought Content: Thought content normal.        Judgment: Judgment  normal.    Wearing brace  CBC:    BMET Recent Labs    03/04/21 0426  NA 139  K 3.7  CL 104  CO2 27  GLUCOSE 112*  BUN 11  CREATININE 0.84  CALCIUM 8.9     Liver Panel  No results for input(s): PROT, ALBUMIN, AST, ALT, ALKPHOS, BILITOT, BILIDIR, IBILI in the last 72 hours.     Sedimentation Rate No results for input(s): ESRSEDRATE in the last 72 hours. C-Reactive Protein No results for input(s): CRP in the last 72 hours.  Micro Results: Recent Results (from the past 720 hour(s))  Resp Panel by RT-PCR (Flu A&B, Covid) Nasopharyngeal Swab     Status: None   Collection Time: 02/07/21  7:53 PM   Specimen: Nasopharyngeal Swab; Nasopharyngeal(NP) swabs in vial transport medium  Result Value Ref Range Status   SARS Coronavirus 2 by RT PCR NEGATIVE NEGATIVE Final    Comment: (NOTE) SARS-CoV-2 target nucleic acids are NOT DETECTED.  The SARS-CoV-2 RNA is generally detectable in upper respiratory specimens during the acute phase of infection. The lowest concentration of SARS-CoV-2 viral copies this assay can detect is 138 copies/mL. A negative result does not preclude SARS-Cov-2 infection and should not be used as the sole basis for treatment or other patient management decisions. A negative result may occur with  improper specimen collection/handling, submission of specimen other than nasopharyngeal swab, presence of viral mutation(s) within the areas targeted by this assay, and inadequate number of viral copies(<138 copies/mL). A negative result must be combined with clinical observations, patient history, and epidemiological information. The expected result is Negative.  Fact Sheet for Patients:  EntrepreneurPulse.com.au  Fact Sheet for Healthcare Providers:  IncredibleEmployment.be  This test is no t yet approved or cleared by the [place] FDA and  has been authorized for detection and/or diagnosis of SARS-CoV-2 by FDA  under an Emergency Use Authorization (EUA). This EUA will remain  in effect (meaning this test can be used) for the duration of the COVID-19 declaration under Section 564(b)(1) of the Act, 21 U.S.C.section 360bbb-3(b)(1), unless the authorization is terminated  or revoked sooner.       Influenza A by PCR NEGATIVE NEGATIVE Final   Influenza B by PCR NEGATIVE NEGATIVE Final    Comment: (  NOTE) The Xpert Xpress SARS-CoV-2/FLU/RSV plus assay is intended as an aid in the diagnosis of influenza from Nasopharyngeal swab specimens and should not be used as a sole basis for treatment. Nasal washings and aspirates are unacceptable for Xpert Xpress SARS-CoV-2/FLU/RSV testing.  Fact Sheet for Patients: EntrepreneurPulse.com.au  Fact Sheet for Healthcare Providers: IncredibleEmployment.be  This test is not yet approved or cleared by the [place] FDA and has been authorized for detection and/or diagnosis of SARS-CoV-2 by FDA under an Emergency Use Authorization (EUA). This EUA will remain in effect (meaning this test can be used) for the duration of the COVID-19 declaration under Section 564(b)(1) of the Act, 21 U.S.C. section 360bbb-3(b)(1), unless the authorization is terminated or revoked.  Performed at [facility], [place] [address], [place], [place] [address]   SARS CORONAVIRUS 2 (TAT 6-24 HRS) Nasopharyngeal Nasopharyngeal Swab     Status: None   Collection Time: 02/10/21  1:23 PM   Specimen: Nasopharyngeal Swab  Result Value Ref Range Status   SARS Coronavirus 2 NEGATIVE NEGATIVE Final    Comment: (NOTE) SARS-CoV-2 target nucleic acids are NOT DETECTED.  The SARS-CoV-2 RNA is generally detectable in upper and lower respiratory specimens during the acute phase of infection. Negative results do not preclude SARS-CoV-2 infection, do not rule out co-infections with other pathogens, and should not be used as the sole basis for treatment or  other patient management decisions. Negative results must be combined with clinical observations, patient history, and epidemiological information. The expected result is Negative.  Fact Sheet for Patients: SugarRoll.be  Fact Sheet for Healthcare Providers: https://www.woods-mathews.com/  This test is not yet approved or cleared by the [place] FDA and  has been authorized for detection and/or diagnosis of SARS-CoV-2 by FDA under an Emergency Use Authorization (EUA). This EUA will remain  in effect (meaning this test can be used) for the duration of the COVID-19 declaration under Se ction 564(b)(1) of the Act, 21 U.S.C. section 360bbb-3(b)(1), unless the authorization is terminated or revoked sooner.  Performed at [facility], [place] [address] [place] [address] [place], [place] [address]   Resp Panel by RT-PCR (Flu A&B, Covid) Nasopharyngeal Swab     Status: None   Collection Time: 02/26/21  5:34 AM   Specimen: Nasopharyngeal Swab; Nasopharyngeal(NP) swabs in vial transport medium  Result Value Ref Range Status   SARS Coronavirus 2 by RT PCR NEGATIVE NEGATIVE Final    Comment: (NOTE) SARS-CoV-2 target nucleic acids are NOT DETECTED.  The SARS-CoV-2 RNA is generally detectable in upper respiratory specimens during the acute phase of infection. The lowest concentration of SARS-CoV-2 viral copies this assay can detect is 138 copies/mL. A negative result does not preclude SARS-Cov-2 infection and should not be used as the sole basis for treatment or other patient management decisions. A negative result may occur with  improper specimen collection/handling, submission of specimen other than nasopharyngeal swab, presence of viral mutation(s) within the areas targeted by this assay, and inadequate number of viral copies(<138 copies/mL). A negative result must be combined with clinical observations, patient history, and epidemiological information.  The expected result is Negative.  Fact Sheet for Patients:  EntrepreneurPulse.com.au  Fact Sheet for Healthcare Providers:  IncredibleEmployment.be  This test is no t yet approved or cleared by the [place] FDA and  has been authorized for detection and/or diagnosis of SARS-CoV-2 by FDA under an Emergency Use Authorization (EUA). This EUA will remain  in effect (meaning this test can be used) for  the duration of the COVID-19 declaration under Section 564(b)(1) of the Act, 21 U.S.C.section 360bbb-3(b)(1), unless the authorization is terminated  or revoked sooner.       Influenza A by PCR NEGATIVE NEGATIVE Final   Influenza B by PCR NEGATIVE NEGATIVE Final    Comment: (NOTE) The Xpert Xpress SARS-CoV-2/FLU/RSV plus assay is intended as an aid in the diagnosis of influenza from Nasopharyngeal swab specimens and should not be used as a sole basis for treatment. Nasal washings and aspirates are unacceptable for Xpert Xpress SARS-CoV-2/FLU/RSV testing.  Fact Sheet for Patients: EntrepreneurPulse.com.au  Fact Sheet for Healthcare Providers: IncredibleEmployment.be  This test is not yet approved or cleared by the [place] FDA and has been authorized for detection and/or diagnosis of SARS-CoV-2 by FDA under an Emergency Use Authorization (EUA). This EUA will remain in effect (meaning this test can be used) for the duration of the COVID-19 declaration under Section 564(b)(1) of the Act, 21 U.S.C. section 360bbb-3(b)(1), unless the authorization is terminated or revoked.  Performed at [facility], [place] [address] [place], [place] [address]   Aerobic/Anaerobic Culture w Gram Stain (surgical/deep wound)     Status: None   Collection Time: 02/26/21  4:02 PM   Specimen: PATH Other; Body Fluid  Result Value Ref Range Status   Specimen Description WOUND  Final   Special Requests SUBFASICAL FLUID  SWAB  Final   Gram Stain NO WBC SEEN NO ORGANISMS SEEN   Final   Culture   Final    No growth aerobically or anaerobically. Performed at [facility], [place] [address] [place], [place] [address]    Report Status 03/04/2021 FINAL  Final  Anaerobic culture w Gram Stain     Status: None   Collection Time: 02/26/21  4:11 PM   Specimen: PATH Other; Tissue  Result Value Ref Range Status   Specimen Description TISSUE  Final   Special Requests  SUBFASICAL TISSUE  Final   Gram Stain   Final    ABUNDANT WBC PRESENT,BOTH PMN AND MONONUCLEAR NO ORGANISMS SEEN    Culture   Final    NO ANAEROBES ISOLATED Performed at [facility], [place] [address], [place], [place] [address]    Report Status 03/04/2021 FINAL  Final  Aerobic Culture w Gram Stain (superficial specimen)     Status: None   Collection Time: 02/26/21  4:11 PM   Specimen: Tissue  Result Value Ref Range Status   Specimen Description TISSUE  Final   Special Requests SUBFASICAL TISSUES SPEC B  Final   Gram Stain   Final    ABUNDANT WBC PRESENT,BOTH PMN AND MONONUCLEAR NO ORGANISMS SEEN    Culture   Final    NO GROWTH 2 DAYS Performed at [facility], [place] [address], [place], [place] [address]    Report Status 03/01/2021 FINAL  Final    Studies/Results: No results found.    Assessment/Plan:  INTERVAL HISTORY: no growth from repeat surgical cultures   Active Problems:   Post op infection    Keith Dixon is a 59 y.o. male with  spinal fusion in August complicated by wound dehiscence with cultures growing Klebsiella. He was DC on oral bactrim but returns with further dehiscence of the wound requiring repeat I and D.   #1 Spinal fusion complicated by wound dehiscence and infection requiring I and D x 2   I have reviewed Cultures have been unrevealing and are all no growth  final  I have reviewed ESR and CRP from this admission which were 35 and 1 respectively both slightly elevated  I have  reviewed operative report from 02/26/2021 and MRI of T spine from 02/26/2021   Neurosurgery  did not indicate evidence of infection down to bone or hardware though MRI read bothers me.   We will continue ceftriaxone at present  WIll discuss with the patient options of IV vs oral therapy at DC  HIV screening: will rescreen for HIV       LOS: 6 days   Alcide Evener 03/04/2021, 5:30 PM

## 2021-03-04 NOTE — Progress Notes (Signed)
Mobility Specialist Progress Note:   03/04/21 1153  Mobility  Activity Ambulated in hall  Level of Assistance Moderate assist, patient does 50-74%  Assistive Device Front wheel walker  Distance Ambulated (ft) 240 ft  Mobility Ambulated with assistance in hallway  Mobility Response Tolerated well  Mobility performed by Mobility specialist  Bed Position Chair  $Mobility charge 1 Mobility   Pt received in chair willing to participate in mobility. ModA to stand then contact guard throughout ambulation. Complaints of 3/10 pain. Pt returned to chair with call bell in reach and all needs met.     Mobility Specialist Phone [phone]  

## 2021-03-04 NOTE — Care Management Important Message (Signed)
Important Message  Patient Details  Name: Keith Dixon MRN: [id number] Date of Birth: 1960-07-05   Medicare Important Message Given:  Yes     Orbie Pyo 03/04/2021, 4:52 PM

## 2021-03-05 DIAGNOSIS — T8142XA Infection following a procedure, deep incisional surgical site, initial encounter: Secondary | ICD-10-CM | POA: Diagnosis not present

## 2021-03-05 DIAGNOSIS — A498 Other bacterial infections of unspecified site: Secondary | ICD-10-CM | POA: Diagnosis not present

## 2021-03-05 DIAGNOSIS — Z981 Arthrodesis status: Secondary | ICD-10-CM | POA: Diagnosis not present

## 2021-03-05 LAB — HIV ANTIBODY (ROUTINE TESTING W REFLEX): HIV Screen 4th Generation wRfx: NONREACTIVE

## 2021-03-05 NOTE — Progress Notes (Signed)
Mobility Specialist Progress Note:   03/05/21 1132  Mobility  Activity Ambulated in hall  Level of Assistance Moderate assist, patient does 50-74%  Assistive Device Front wheel walker  Distance Ambulated (ft) 120 ft  Mobility Ambulated with assistance in hallway  Mobility Response Tolerated well  Mobility performed by Mobility specialist  Bed Position Chair  $Mobility charge 1 Mobility   Pt received in chair willing to participate in mobility. ModA to stand from chair then contact guard throughout ambulation. Required cues to move hand to walker from chair and cues to keep head and chest up. Pt returned to chair with call bell in reach and all needs met.  [facility] Health and safety inspector Phone [phone]

## 2021-03-05 NOTE — Progress Notes (Signed)
Physical Therapy Treatment Patient Details Name: Keith Dixon MRN: [id number] DOB: 1960/10/12 Today's Date: 03/05/2021   History of Present Illness Pt is a 60 y.o. male who was admitted to [facility] on 10/17 for possible spinal infection.  MRI (+) for post-op fluid collection/infection from T6-T11. Underwent wound exploration/debridement on 10/18. 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), PTSD, OSA, HTN, tobacco use, bipolar, degenerative scoliosis, seizures    PT Comments    Continuing work on functional mobility and activity tolerance;  Session focused on progressive ambulation, with notably good participation and improvements;  Employed chair push behind to incr pt's confidence and allow for hallway ambulation; While his gait distance/tolerance is increasing, he still requires close guard and heavy reliance on RW for support; Holding tightly to RW and showing tremor-like movements of trunk; still with L heel pain leading to gait assymentries; Good progress -- still, overall picture is that he still needs rehab to work to make gait more functional  Recommendations for follow up therapy are one component of a multi-disciplinary discharge planning process, led by the attending physician.  Recommendations may be updated based on patient status, additional functional criteria and insurance authorization.  Follow Up Recommendations  Skilled nursing-short term rehab (<3 hours/day)     Assistance Recommended at Discharge Frequent or constant Supervision/Assistance  Equipment Recommendations  Rolling walker (2 wheels)    Recommendations for Other Services       Precautions / Restrictions Precautions Precautions: Back Precaution Booklet Issued: No Precaution Comments: Thoracic Required Braces or Orthoses: Other  Brace Spinal Brace: Other (comment);Applied in sitting position Spinal Brace Comments: Clamshell     Mobility  Bed Mobility Overal bed mobility: Needs Assistance Bed Mobility: Rolling;Sidelying to Sit;Sit to Supine Rolling: Modified independent (Device/Increase time) Sidelying to sit: Supervision   Sit to supine: Min assist   General bed mobility comments: incr time to roll and push up sidelie to sit, but good form; needed min assist to help LEs into bed; Tended to lay striaght back to supine despite plan to go to sidelying and then roll to supine    Transfers Overall transfer level: Needs assistance Equipment used: Rolling walker (2 wheels) Transfers: Sit to/from Stand Sit to Stand: Mod assist           General transfer comment: Cues for hand positioning and to use LEs more for power up; tended to pull up on RW; stood from elevated bed as well as from recliner    Ambulation/Gait Ambulation/Gait assistance: Min guard Gait Distance (Feet): 120 Feet (or greater) Assistive device: Rolling walker (2 wheels) Gait Pattern/deviations: Decreased step length - right;Decreased step length - left;Trunk flexed;Decreased dorsiflexion - left (Decr heel strike L; Noting L step length less than R) Gait velocity: Slwo   General Gait Details: Motivated and seemed happy to walk outside his room; With L heel pain, and he avoided L heel strike, leading to gait assymetries -- in particular decr L step length compared to R; Bil hips slightly flexed throughout gait cycle; Heavy dependence on UE support on RW; Tremulous   Stairs             Wheelchair Mobility    Modified Rankin (Stroke Patients Only)       Balance Overall balance assessment: Needs assistance   Sitting balance-Leahy Scale: Fair Sitting balance - Comments: Tends to still lean  back; props self with bil UEs Postural control: Posterior lean   Standing balance-Leahy Scale: Poor (approcahing Fair)                               Cognition Arousal/Alertness: Awake/alert Behavior During Therapy: WFL for tasks assessed/performed Overall Cognitive Status: Within Functional Limits for tasks assessed                                 General Comments: 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        Exercises      General Comments        Pertinent Vitals/Pain Pain Assessment: Faces Faces Pain Scale: Hurts even more Pain Location: Lower back; L heel pain effecting heel strike in gait Pain Descriptors / Indicators: Discomfort;Grimacing Pain Intervention(s): Monitored during session;Repositioned (Reapplied the Prevalon boot)    Home Living                          Prior Function            PT Goals (current goals can now be found in the care plan section) Acute Rehab PT Goals Patient Stated Goal: Get out of hospital PT Goal Formulation: With patient Time For Goal Achievement: 03/12/21 Potential to Achieve Goals: Good Progress towards PT goals: Progressing toward goals    Frequency    Min 5X/week      PT Plan Current plan remains appropriate    Co-evaluation              AM-PAC PT "6 Clicks" Mobility   Outcome Measure  Help needed turning from your back to your side while in a flat bed without using bedrails?: A Little Help needed moving from lying on your back to sitting on the side of a flat bed without using bedrails?: A Little Help needed moving to and from a bed to a chair (including a wheelchair)?: A Little Help needed standing up from a chair using your arms (e.g., wheelchair or bedside chair)?: A Little Help needed to walk in hospital room?: A Little Help needed climbing 3-5 steps with a railing? : A Lot 6 Click Score: 17    End of Session Equipment Utilized During Treatment: Back brace Activity Tolerance: Patient tolerated treatment well;Patient limited by pain Patient left: in bed;with call bell/phone within  reach;with bed alarm set Nurse Communication: Mobility status PT Visit Diagnosis: Other abnormalities of gait and mobility (R26.89);Unsteadiness on feet (R26.81);Pain Pain - Right/Left:  (midline) Pain - part of body:  (Back)     Time: 1459-1540 PT Time Calculation (min) (ACUTE ONLY): 41 min  Charges:  $Gait Training: 23-37 mins $Therapeutic Activity: 8-22 mins                     Roney Marion, PT  Acute Rehabilitation Services Pager [phone] Office [place] 03/05/2021, 5:36 PM

## 2021-03-05 NOTE — Progress Notes (Signed)
Subjective: Patient reports no significant discomfort  Objective: Vital signs in last 24 hours: Temp:  [97.7 F (36.5 C)-98.4 F (36.9 C)] 97.7 F (36.5 C) (10/25 0820) Pulse Rate:  [72-84] 76 (10/25 0820) Resp:  [18-19] 18 (10/25 0820) BP: (132-157)/(83-104) 150/104 (10/25 0820) SpO2:  [98 %-100 %] 100 % (10/25 0820)  Intake/Output from previous day: 10/24 0701 - 10/25 0700 In: 600 [P.O.:600] Out: 200 [Urine:200] Intake/Output this shift: No intake/output data recorded.  Awake, alert Wound vac in place Full strength in LEs  Lab Results: Recent Labs    03/04/21 0426  WBC 5.8  HGB 8.4*  HCT 27.3*  PLT 289   BMET Recent Labs    03/04/21 0426  NA 139  K 3.7  CL 104  CO2 27  GLUCOSE 112*  BUN 11  CREATININE 0.84  CALCIUM 8.9    Studies/Results: No results found.  Assessment/Plan: 60 yo M s/p wound vac for wound dehiscence - though cultures have been negative, inflammatory markers essentially normal and no infection found intraoperatively, I think erring on the side of caution and treating empirically would be prudent given he has required two wound revisions - M/W/F wound vac changes; likely can return to rehab with wound vac changes   Vallarie Mare 03/05/2021, 10:17 AM

## 2021-03-05 NOTE — TOC Progression Note (Signed)
Transition of Care [facility]) - Progression Note    Patient Details  Name: Keith Dixon MRN: [id number] Date of Birth: [birth year]  Transition of Care [facility]) CM/SW Contact  Emeterio Reeve, [place] Phone Number: 03/05/2021, 4:31 PM  Clinical Narrative:     CSW spoke with pt at bedside about CIR vs SNF. Pt requested that to be discussed with POA. CSW spoke to [place], POE about CIR VS SNF. [place] requested CIR admission to call to discuss.  Admission coordinator spoke to [facility] and it was decided that pt will return to SNF at [facility]. CSW will continue to follow.  Expected Discharge Plan: [place] Barriers to Discharge: Continued Medical Work up  Expected Discharge Plan and Services Expected Discharge Plan: [place]                                               Social Determinants of Health (SDOH) Interventions    Readmission Risk Interventions Readmission Risk Prevention Plan 02/11/2021  Transportation Screening Complete  Medication Review Press photographer) Complete  PCP or Specialist appointment within 3-5 days of discharge Complete  HRI or [place] Complete  SW Recovery Care/Counseling Consult Complete  Palliative Care Screening Not [place] Complete  Some recent data might be hidden   Emeterio Reeve, LCSW Clinical Social Worker

## 2021-03-06 NOTE — TOC Progression Note (Signed)
Transition of Care [facility]) - Progression Note    Patient Details  Name: SAAMIR ARMSTRONG MRN: [id number] Date of Birth: 11/07/1960  Transition of Care [facility]) CM/SW Contact  Emeterio Reeve, [place] Phone Number: 03/06/2021, 2:49 PM  Clinical Narrative:     CSW left a messages with MD's assistant requesting a call back about pts discharge. Pt has a SNF bed and can discharge back to [facility] when medically stable. Facility is aware of wound vac and possible ABX. Pt will need new covid test prior to discharge.  Expected Discharge Plan: [place] Barriers to Discharge: Continued Medical Work up  Expected Discharge Plan and Services Expected Discharge Plan: [place]                                               Social Determinants of Health (SDOH) Interventions    Readmission Risk Interventions Readmission Risk Prevention Plan 02/11/2021  Transportation Screening Complete  Medication Review Press photographer) Complete  PCP or Specialist appointment within 3-5 days of discharge Complete  HRI or [place] Complete  SW Recovery Care/Counseling Consult Complete  Palliative Care Screening Not [place] Complete  Some recent data might be hidden   Emeterio Reeve, LCSW Clinical Social Worker

## 2021-03-06 NOTE — Progress Notes (Signed)
Subjective: No new complaints   Antibiotics:  Anti-infectives (From admission, onward)    Start     Dose/Rate Route Frequency Ordered Stop   02/27/21 1600  cefTRIAXone (ROCEPHIN) 2 g in sodium chloride 0.9 % 100 mL IVPB        2 g 200 mL/hr over 30 Minutes Intravenous Every 24 hours 02/27/21 1205     02/26/21 1830  DAPTOmycin (CUBICIN) 750 mg in sodium chloride 0.9 % IVPB  Status:  Discontinued        725 mg 130 mL/hr over 30 Minutes Intravenous Daily 02/26/21 1749 02/26/21 1754   02/26/21 1830  DAPTOmycin (CUBICIN) 750 mg in sodium chloride 0.9 % IVPB  Status:  Discontinued        8 mg/kg  90.7 kg 130 mL/hr over 30 Minutes Intravenous Daily 02/26/21 1754 02/27/21 1205   02/26/21 1800  ceFEPIme (MAXIPIME) 2 g in sodium chloride 0.9 % 100 mL IVPB  Status:  Discontinued        2 g 200 mL/hr over 30 Minutes Intravenous Every 8 hours 02/26/21 1733 02/27/21 1205   02/26/21 1500  ceFAZolin (ANCEF) IVPB 2g/100 mL premix        2 g 200 mL/hr over 30 Minutes Intravenous  Once 02/26/21 1450 02/26/21 1521   02/26/21 1450  ceFAZolin (ANCEF) 2-4 GM/100ML-% IVPB       Note to Pharmacy: Cameron Sprang   : cabinet override      02/26/21 1450 02/26/21 1534       Medications: Scheduled Meds:  aspirin  81 mg Oral Daily   Chlorhexidine Gluconate Cloth  6 each Topical Daily   enoxaparin  40 mg Subcutaneous Q24H   feeding supplement  237 mL Oral TID   folic acid  1 mg Oral Daily   gabapentin  300 mg Oral BID   hydrOXYzine  50 mg Oral Q12H   labetalol  100 mg Oral BID   lamoTRIgine  100 mg Oral BID   levETIRAcetam  1,500 mg Oral BID   magnesium oxide  400 mg Oral Daily   pantoprazole  40 mg Oral QHS   QUEtiapine  200 mg Oral QHS   sertraline  100 mg Oral QPC breakfast   sodium bicarbonate  650 mg Oral TID   sodium chloride flush  10-40 mL Intracatheter Q12H   cyanocobalamin  1,000 mcg Oral Daily   zinc sulfate  220 mg Oral Daily   Continuous Infusions:  sodium chloride  10 mL/hr at 02/27/21 1815   0.9 % NaCl with KCl 20 mEq / L 125 mL/hr at 03/03/21 0453   cefTRIAXone (ROCEPHIN)  IV 2 g (03/05/21 1605)   PRN Meds:.acetaminophen **OR** acetaminophen, cyclobenzaprine, docusate sodium, HYDROmorphone (DILAUDID) injection, menthol-cetylpyridinium **OR** phenol, ondansetron **OR** ondansetron (ZOFRAN) IV, oxyCODONE, oxyCODONE, sodium chloride flush, sodium phosphate    Objective: Weight change:   Intake/Output Summary (Last 24 hours) at 03/06/2021 1450 Last data filed at 03/05/2021 2120 Gross per 24 hour  Intake 240 ml  Output 601 ml  Net -361 ml    Blood pressure (!) 151/85, pulse 77, temperature 98.3 F (36.8 C), temperature source Oral, resp. rate 16, height 6' 1"  (1.854 m), weight 90.7 kg, SpO2 98 %. Temp:  [98.2 F (36.8 C)-98.6 F (37 C)] 98.3 F (36.8 C) (10/26 0738) Pulse Rate:  [77-85] 77 (10/26 0738) Resp:  [16] 16 (10/26 0738) BP: (139-172)/(85-88) 151/85 (10/26 0738) SpO2:  [97 %-98 %] 98 % (10/26 0738)  Physical Exam: Physical Exam Constitutional:      Appearance: He is well-developed.  HENT:     Head: Normocephalic and atraumatic.  Eyes:     Extraocular Movements: Extraocular movements intact.     Conjunctiva/sclera: Conjunctivae normal.  Cardiovascular:     Rate and Rhythm: Normal rate and regular rhythm.  Pulmonary:     Effort: Pulmonary effort is normal. No respiratory distress.     Breath sounds: Normal breath sounds. No stridor. No wheezing.  Abdominal:     General: There is no distension.     Palpations: Abdomen is soft.  Musculoskeletal:     Cervical back: Normal range of motion and neck supple.  Skin:    General: Skin is warm and dry.  Neurological:     General: No focal deficit present.     Mental Status: He is alert and oriented to person, place, and time.  Psychiatric:        Mood and Affect: Mood normal.        Behavior: Behavior normal.        Thought Content: Thought content normal.        Judgment:  Judgment normal.    Wearing brace  CBC:    BMET Recent Labs    03/04/21 0426  NA 139  K 3.7  CL 104  CO2 27  GLUCOSE 112*  BUN 11  CREATININE 0.84  CALCIUM 8.9      Liver Panel  No results for input(s): PROT, ALBUMIN, AST, ALT, ALKPHOS, BILITOT, BILIDIR, IBILI in the last 72 hours.     Sedimentation Rate No results for input(s): ESRSEDRATE in the last 72 hours. C-Reactive Protein No results for input(s): CRP in the last 72 hours.  Micro Results: Recent Results (from the past 720 hour(s))  Resp Panel by RT-PCR (Flu A&B, Covid) Nasopharyngeal Swab     Status: None   Collection Time: 02/07/21  7:53 PM   Specimen: Nasopharyngeal Swab; Nasopharyngeal(NP) swabs in vial transport medium  Result Value Ref Range Status   SARS Coronavirus 2 by RT PCR NEGATIVE NEGATIVE Final    Comment: (NOTE) SARS-CoV-2 target nucleic acids are NOT DETECTED.  The SARS-CoV-2 RNA is generally detectable in upper respiratory specimens during the acute phase of infection. The lowest concentration of SARS-CoV-2 viral copies this assay can detect is 138 copies/mL. A negative result does not preclude SARS-Cov-2 infection and should not be used as the sole basis for treatment or other patient management decisions. A negative result may occur with  improper specimen collection/handling, submission of specimen other than nasopharyngeal swab, presence of viral mutation(s) within the areas targeted by this assay, and inadequate number of viral copies(<138 copies/mL). A negative result must be combined with clinical observations, patient history, and epidemiological information. The expected result is Negative.  Fact Sheet for Patients:  EntrepreneurPulse.com.au  Fact Sheet for Healthcare Providers:  IncredibleEmployment.be  This test is no t yet approved or cleared by the [place] FDA and  has been authorized for detection and/or diagnosis of  SARS-CoV-2 by FDA under an Emergency Use Authorization (EUA). This EUA will remain  in effect (meaning this test can be used) for the duration of the COVID-19 declaration under Section 564(b)(1) of the Act, 21 U.S.C.section 360bbb-3(b)(1), unless the authorization is terminated  or revoked sooner.       Influenza A by PCR NEGATIVE NEGATIVE Final   Influenza B by PCR NEGATIVE NEGATIVE Final    Comment: (NOTE) The Xpert Xpress SARS-CoV-2/FLU/RSV plus  assay is intended as an aid in the diagnosis of influenza from Nasopharyngeal swab specimens and should not be used as a sole basis for treatment. Nasal washings and aspirates are unacceptable for Xpert Xpress SARS-CoV-2/FLU/RSV testing.  Fact Sheet for Patients: EntrepreneurPulse.com.au  Fact Sheet for Healthcare Providers: IncredibleEmployment.be  This test is not yet approved or cleared by the [place] FDA and has been authorized for detection and/or diagnosis of SARS-CoV-2 by FDA under an Emergency Use Authorization (EUA). This EUA will remain in effect (meaning this test can be used) for the duration of the COVID-19 declaration under Section 564(b)(1) of the Act, 21 U.S.C. section 360bbb-3(b)(1), unless the authorization is terminated or revoked.  Performed at [facility], [place] [address], [place], [place] [address]   SARS CORONAVIRUS 2 (TAT 6-24 HRS) Nasopharyngeal Nasopharyngeal Swab     Status: None   Collection Time: 02/10/21  1:23 PM   Specimen: Nasopharyngeal Swab  Result Value Ref Range Status   SARS Coronavirus 2 NEGATIVE NEGATIVE Final    Comment: (NOTE) SARS-CoV-2 target nucleic acids are NOT DETECTED.  The SARS-CoV-2 RNA is generally detectable in upper and lower respiratory specimens during the acute phase of infection. Negative results do not preclude SARS-CoV-2 infection, do not rule out co-infections with other pathogens, and should not be used as the sole  basis for treatment or other patient management decisions. Negative results must be combined with clinical observations, patient history, and epidemiological information. The expected result is Negative.  Fact Sheet for Patients: SugarRoll.be  Fact Sheet for Healthcare Providers: https://www.woods-mathews.com/  This test is not yet approved or cleared by the [place] FDA and  has been authorized for detection and/or diagnosis of SARS-CoV-2 by FDA under an Emergency Use Authorization (EUA). This EUA will remain  in effect (meaning this test can be used) for the duration of the COVID-19 declaration under Se ction 564(b)(1) of the Act, 21 U.S.C. section 360bbb-3(b)(1), unless the authorization is terminated or revoked sooner.  Performed at [facility], [place] [address], [place], [place] [address]   Resp Panel by RT-PCR (Flu A&B, Covid) Nasopharyngeal Swab     Status: None   Collection Time: 02/26/21  5:34 AM   Specimen: Nasopharyngeal Swab; Nasopharyngeal(NP) swabs in vial transport medium  Result Value Ref Range Status   SARS Coronavirus 2 by RT PCR NEGATIVE NEGATIVE Final    Comment: (NOTE) SARS-CoV-2 target nucleic acids are NOT DETECTED.  The SARS-CoV-2 RNA is generally detectable in upper respiratory specimens during the acute phase of infection. The lowest concentration of SARS-CoV-2 viral copies this assay can detect is 138 copies/mL. A negative result does not preclude SARS-Cov-2 infection and should not be used as the sole basis for treatment or other patient management decisions. A negative result may occur with  improper specimen collection/handling, submission of specimen other than nasopharyngeal swab, presence of viral mutation(s) within the areas targeted by this assay, and inadequate number of viral copies(<138 copies/mL). A negative result must be combined with clinical observations, patient history, and  epidemiological information. The expected result is Negative.  Fact Sheet for Patients:  EntrepreneurPulse.com.au  Fact Sheet for Healthcare Providers:  IncredibleEmployment.be  This test is no t yet approved or cleared by the [place] FDA and  has been authorized for detection and/or diagnosis of SARS-CoV-2 by FDA under an Emergency Use Authorization (EUA). This EUA will remain  in effect (meaning this test can be used) for the duration of the COVID-19 declaration  under Section 564(b)(1) of the Act, 21 U.S.C.section 360bbb-3(b)(1), unless the authorization is terminated  or revoked sooner.       Influenza A by PCR NEGATIVE NEGATIVE Final   Influenza B by PCR NEGATIVE NEGATIVE Final    Comment: (NOTE) The Xpert Xpress SARS-CoV-2/FLU/RSV plus assay is intended as an aid in the diagnosis of influenza from Nasopharyngeal swab specimens and should not be used as a sole basis for treatment. Nasal washings and aspirates are unacceptable for Xpert Xpress SARS-CoV-2/FLU/RSV testing.  Fact Sheet for Patients: EntrepreneurPulse.com.au  Fact Sheet for Healthcare Providers: IncredibleEmployment.be  This test is not yet approved or cleared by the [place] FDA and has been authorized for detection and/or diagnosis of SARS-CoV-2 by FDA under an Emergency Use Authorization (EUA). This EUA will remain in effect (meaning this test can be used) for the duration of the COVID-19 declaration under Section 564(b)(1) of the Act, 21 U.S.C. section 360bbb-3(b)(1), unless the authorization is terminated or revoked.  Performed at [facility], [place] [address], [place], [place] [address]   Aerobic/Anaerobic Culture w Gram Stain (surgical/deep wound)     Status: None   Collection Time: 02/26/21  4:02 PM   Specimen: PATH Other; Body Fluid  Result Value Ref Range Status   Specimen Description WOUND  Final   Special  Requests SUBFASICAL FLUID SWAB  Final   Gram Stain NO WBC SEEN NO ORGANISMS SEEN   Final   Culture   Final    No growth aerobically or anaerobically. Performed at [facility], [place] [address], [place], [place] [address]    Report Status 03/04/2021 FINAL  Final  Anaerobic culture w Gram Stain     Status: None   Collection Time: 02/26/21  4:11 PM   Specimen: PATH Other; Tissue  Result Value Ref Range Status   Specimen Description TISSUE  Final   Special Requests  SUBFASICAL TISSUE  Final   Gram Stain   Final    ABUNDANT WBC PRESENT,BOTH PMN AND MONONUCLEAR NO ORGANISMS SEEN    Culture   Final    NO ANAEROBES ISOLATED Performed at [facility], [place] [address], [place], [place] [address]    Report Status 03/04/2021 FINAL  Final  Aerobic Culture w Gram Stain (superficial specimen)     Status: None   Collection Time: 02/26/21  4:11 PM   Specimen: Tissue  Result Value Ref Range Status   Specimen Description TISSUE  Final   Special Requests SUBFASICAL TISSUES SPEC B  Final   Gram Stain   Final    ABUNDANT WBC PRESENT,BOTH PMN AND MONONUCLEAR NO ORGANISMS SEEN    Culture   Final    NO GROWTH 2 DAYS Performed at [facility], [place] [address] [place], [place] [address]    Report Status 03/01/2021 FINAL  Final    Studies/Results: No results found.    Assessment/Plan:  INTERVAL HISTORY:  Cultures no growth and final  Active Problems:   Post op infection    Keith Dixon is a 60 y.o. male with  spinal fusion in August complicated by wound dehiscence with cultures growing Klebsiella. He was DC on oral bactrim but returns with further dehiscence of the wound requiring repeat I and D.   #1  Spinal fusion complicated wound dehiscence and infection requiring I&D x2  I will plan on setting him up for 6 weeks of IV antibiotics with Ceftriaxone and followup in clnic with me  Diagnosis:  Wound dehiscence after spinal surgery  Culture Result: Klebsiella  pneumonia  No Known Allergies  OPAT Orders Discharge antibiotics to be given via PICC line Discharge antibiotics: Ceftriaxone 2 g IV daily  Duration: 6 weeks  End Date: April 08, 2021  [place] Per Protocol:  Home health RN for IV administration and teaching; PICC line care and labs.    Labs weekly while on IV antibiotics: _x_ CBC with differential _x_ BMP  _x_ CRP _x_ ESR   _x_ Please pull PIC at completion of IV antibiotics __ Please leave PIC in place until doctor has seen patient or been notified  Fax weekly labs to [phone]  Clinic Follow Up Appt:   Keith Dixon has an appointment on 03/28/2021 at 10AM with Dr. Tommy Medal  The [facility] for Infectious Disease is located in the [facility] at  [place] in [place].  [address], which is located to the left of the elevators.  Phone: [phone]  Fax: [phone]  [email or web address]   He should arrive 15-30 minutes prior to his appointment  I will sign off for now  Please call with further questions.  I spent  36  minutes with the patient including face to face counseling of the patient re his postoperative infection personally reviewing MRI T spine, final cultures, ESR, CRP, along with review of  of medical records before and during the visit and in coordination of his care.         LOS: 8 days   Alcide Evener 03/06/2021, 2:50 PM

## 2021-03-06 NOTE — Consult Note (Addendum)
[place] Nurse wound follow up Vac dressing changed to full thickness post-op wound to middle back. Pt medicated for pain prior to the procedure and tolerated with minimal amt discomfort.  Wound bed: Slowly decreasing in size and depth: 6X1X.8cm, beefy red Drainage (amount, consistency, odor) minimal amt pink drainage in the cannister, periwound intact Dressing procedure/placement/frequency: Applied one piece of black foam to 110mm cont suction. Offered to bridge track pad away from back to reduce pressure, but patient states he does not lie on his back at this time and declines the offer. [place] team will perform dressing change on Fri if the patient is still in the hospital at that time.  Thank-you,  Julien Girt MSN, [place], [place], [place], [place]

## 2021-03-06 NOTE — Progress Notes (Signed)
Occupational Therapy Treatment Patient Details Name: Keith Dixon MRN: [id number] DOB: [birth year] Today's Date: 03/06/2021   History of present illness Pt is a 60 y.o. male who was admitted to [facility] on 10/17 for possible spinal infection.  MRI (+) for post-op fluid collection/infection from T6-T11. Underwent wound exploration/debridement on 10/18. 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 Pt remains limited by pain, balance, activity tolerance, and back precautions. During session, pt completed toilet transfer with Min guard, toilet hygiene with Min A, and functional mobility with Min guard. Pt remains appropriate for SNF at d/c. Will continue to follow acutely.   Recommendations for follow up therapy are one component of a multi-disciplinary discharge planning process, led by the attending physician.  Recommendations may be updated based on patient status, additional functional criteria and insurance authorization.    Follow Up Recommendations  Skilled nursing-short term rehab (<3 hours/day)    Assistance Recommended at Discharge    Equipment Recommendations  Other (comment) (TBD at next venue of care)    Recommendations for Other Services      Precautions / Restrictions Precautions Precautions: Back Precaution Booklet Issued: No Precaution Comments: Thoracic Required Braces or Orthoses: Other Brace Spinal Brace: Other (comment);Applied in sitting position Spinal Brace Comments: Clamshell Other Brace: Clamshell Restrictions Weight Bearing Restrictions: No       Mobility Bed Mobility Overal bed mobility: Needs Assistance Bed Mobility: Rolling;Sidelying to Sit Rolling: Modified  independent (Device/Increase time) Sidelying to sit: Supervision            Transfers Overall transfer level: Needs assistance Equipment used: Rolling walker (2 wheels) Transfers: Sit to/from Stand Sit to Stand: Min guard                 Balance Overall balance assessment: Needs assistance Sitting-balance support: Bilateral upper extremity supported;Feet supported Sitting balance-Leahy Scale: Fair     Standing balance support: During functional activity;Bilateral upper extremity supported;No upper extremity supported Standing balance-Leahy Scale: Fair Standing balance comment: Able to let go of walker with both hands to use urinal in standing position                           ADL either performed or assessed with clinical judgement   ADL Overall ADL's : Needs assistance/impaired                         Toilet Transfer: Min guard;Ambulation;BSC;Rolling walker (2 wheels);Grab bars Toilet Transfer Details (indicate cue type and reason): BSC over toilet Toileting- Clothing Manipulation and Hygiene: Minimal assistance;Sitting/lateral lean;Sit to/from stand Toileting - Clothing Manipulation Details (indicate cue type and reason): Initially attempted hygiene with sitting/lateral lean, however performed better in standing. Could likely have completed with Min guard and extra time, however OT provided Min A for thoroughness.     Functional mobility during ADLs: Min guard;Rolling walker (2 wheels) General ADL Comments: Pt limited by pain, balance, and activity toelrance     Vision       Perception     Praxis      Cognition Arousal/Alertness: Awake/alert Behavior During Therapy: WFL for tasks assessed/performed Overall Cognitive Status: Within Functional Limits for tasks assessed  Exercises     Shoulder Instructions       General Comments      Pertinent Vitals/ Pain       Pain  Assessment: Faces Faces Pain Scale: Hurts even more Pain Location: lower back Pain Descriptors / Indicators: Discomfort;Grimacing Pain Intervention(s): Monitored during session;Repositioned  Home Living                                          Prior Functioning/Environment              Frequency  Min 2X/week        Progress Toward Goals  OT Goals(current goals can now be found in the care plan section)  Progress towards OT goals: Progressing toward goals  Acute Rehab OT Goals OT Goal Formulation: With patient Time For Goal Achievement: 03/14/21 Potential to Achieve Goals: Good ADL Goals Pt Will Perform Upper Body Dressing: with set-up;with supervision;sitting Pt Will Perform Lower Body Dressing: sit to/from stand;with min assist Pt Will Transfer to Toilet: with min assist;ambulating;stand pivot transfer Pt Will Perform Toileting - Clothing Manipulation and hygiene: with min assist;sit to/from stand Additional ADL Goal #1: Pt will complete bed mobility at min guard level to prepare for EOB/OOB ADLs.  Plan Discharge plan remains appropriate;Frequency remains appropriate    Co-evaluation                 AM-PAC OT "6 Clicks" Daily Activity     Outcome Measure   Help from another person eating meals?: None Help from another person taking care of personal grooming?: A Little Help from another person toileting, which includes using toliet, bedpan, or urinal?: A Little Help from another person bathing (including washing, rinsing, drying)?: A Lot Help from another person to put on and taking off regular upper body clothing?: A Little Help from another person to put on and taking off regular lower body clothing?: A Lot 6 Click Score: 17    End of Session Equipment Utilized During Treatment: Gait belt;Rolling walker (2 wheels);Back brace  OT Visit Diagnosis: Unsteadiness on feet (R26.81);Other abnormalities of gait and mobility (R26.89);Muscle  weakness (generalized) (M62.81);Other symptoms and signs involving the nervous system (R29.898);Pain   Activity Tolerance Patient tolerated treatment well   Patient Left in chair;with call bell/phone within reach;with chair alarm set   Nurse Communication Mobility status        Time: 9470-9628 OT Time Calculation (min): 28 min  Charges: OT General Charges $OT Visit: 1 Visit OT Treatments $Self Care/Home Management : 23-37 mins  Klaus Casteneda C, OT/L  Acute Rehab [place] 03/06/2021, 3:58 PM

## 2021-03-06 NOTE — Progress Notes (Signed)
Subjective: NAEs o/n  Objective: Vital signs in last 24 hours: Temp:  [98.2 F (36.8 C)-99.8 F (37.7 C)] 99.8 F (37.7 C) (10/26 1612) Pulse Rate:  [77-85] 78 (10/26 1612) Resp:  [16] 16 (10/26 1612) BP: (139-172)/(85-88) 147/85 (10/26 1612) SpO2:  [97 %-99 %] 99 % (10/26 1612)  Intake/Output from previous day: 10/25 0701 - 10/26 0700 In: 720 [P.O.:720] Out: 1151 [Urine:1150; Stool:1] Intake/Output this shift: No intake/output data recorded.  NAD Full neurologic strength in legs  Lab Results: Recent Labs    03/04/21 0426  WBC 5.8  HGB 8.4*  HCT 27.3*  PLT 289   BMET Recent Labs    03/04/21 0426  NA 139  K 3.7  CL 104  CO2 27  GLUCOSE 112*  BUN 11  CREATININE 0.84  CALCIUM 8.9    Studies/Results: No results found.  Assessment/Plan: S/p wound vac for wound dehiscence - hopefully patient can return to his facility tomorrow - cont atbx   Vallarie Mare 03/06/2021, 8:05 PM

## 2021-03-06 NOTE — Progress Notes (Signed)
Mobility Specialist Progress Note:   03/06/21 1230  Mobility  Activity Ambulated to bathroom  Level of Assistance Minimal assist, patient does 75% or more  Assistive Device Front wheel walker  Distance Ambulated (ft) 25 ft  Mobility Out of bed for toileting  Mobility Response Tolerated well  Mobility performed by Mobility specialist;Other (comment) (OT Shawn)  Bed Position Chair  $Mobility charge 1 Mobility   Pt assisted to BR. BM successful. Sitting up in chair.  Nelta Numbers Mobility Specialist  Phone [phone]

## 2021-03-07 LAB — BASIC METABOLIC PANEL
Anion gap: 6 (ref 5–15)
BUN: 16 mg/dL (ref 6–20)
CO2: 28 mmol/L (ref 22–32)
Calcium: 9.1 mg/dL (ref 8.9–10.3)
Chloride: 105 mmol/L (ref 98–111)
Creatinine, Ser: 0.93 mg/dL (ref 0.61–1.24)
GFR, Estimated: >60 mL/min (ref >60)
Glucose, Bld: 103 mg/dL — ABNORMAL HIGH (ref 70–99)
Potassium: 4 mmol/L (ref 3.5–5.1)
Sodium: 139 mmol/L (ref 135–145)

## 2021-03-07 LAB — CBC
HCT: 27.7 % — ABNORMAL LOW (ref 39.0–52.0)
Hemoglobin: 8.6 g/dL — ABNORMAL LOW (ref 13.0–17.0)
MCH: 26.1 pg (ref 26.0–34.0)
MCHC: 31 g/dL (ref 30.0–36.0)
MCV: 84.2 fL (ref 80.0–100.0)
Platelets: 275 10*3/uL (ref 150–400)
RBC: 3.29 MIL/uL — ABNORMAL LOW (ref 4.22–5.81)
RDW: 17.8 % — ABNORMAL HIGH (ref 11.5–15.5)
WBC: 5.4 10*3/uL (ref 4.0–10.5)
nRBC: 0 % (ref 0.0–0.2)

## 2021-03-07 LAB — SARS CORONAVIRUS 2 (TAT 6-24 HRS): SARS Coronavirus 2: NEGATIVE

## 2021-03-07 MED ORDER — OXYCODONE-ACETAMINOPHEN 5-325 MG PO TABS: 1.0000 | ORAL_TABLET | ORAL | 0 refills | Status: DC | PRN

## 2021-03-07 MED ORDER — ZINC SULFATE 220 (50 ZN) MG PO CAPS: 220.0000 mg | ORAL_CAPSULE | Freq: Every day | ORAL | 0 refills | Status: AC

## 2021-03-07 MED ORDER — CYANOCOBALAMIN 1000 MCG PO TABS: 1000.0000 ug | ORAL_TABLET | Freq: Every day | ORAL | 0 refills | Status: AC

## 2021-03-07 MED ORDER — CEFTRIAXONE IV (FOR PTA / DISCHARGE USE ONLY): 2.0000 g | INTRAVENOUS | 0 refills | Status: AC

## 2021-03-07 MED ORDER — FOLIC ACID 1 MG PO TABS: 1.0000 mg | ORAL_TABLET | Freq: Every day | ORAL | 0 refills | Status: AC

## 2021-03-07 NOTE — TOC Progression Note (Signed)
Transition of Care [facility]) - Progression Note    Patient Details  Name: Keith Dixon MRN: [id number] Date of Birth: December 10, 1960  [facility] CM/SW Contact  Emeterio Reeve, [place] Phone Number: 03/07/2021, 2:44 PM  Clinical Narrative:     Pts DC order is in. [facility] does not have a bed available at this time. They will be able to accept pt tomorrow.   Expected Discharge Plan: [place] Barriers to Discharge: Continued Medical Work up  Expected Discharge Plan and Services Expected Discharge Plan: [place]         Expected Discharge Date: 03/07/21                                     Social Determinants of Health (SDOH) Interventions    Readmission Risk Interventions Readmission Risk Prevention Plan 02/11/2021  Transportation Screening Complete  Medication Review Press photographer) Complete  PCP or Specialist appointment within 3-5 days of discharge Complete  HRI or [place] Complete  SW Recovery Care/Counseling Consult Complete  Palliative Care Screening Not [place] Complete  Some recent data might be hidden   Emeterio Reeve, LCSW Clinical Social Worker

## 2021-03-07 NOTE — Discharge Instructions (Addendum)
Wear TLSO clamshell brace when OOB  Wound vac changes M/W/F

## 2021-03-07 NOTE — Progress Notes (Signed)
Physical Therapy Treatment Patient Details Name: Keith Dixon MRN: [id number] DOB: Aug 18, 1960 Today's Date: 03/07/2021   History of Present Illness Pt is a 60 y.o. male who was admitted to [facility] on 10/17 for possible spinal infection.  MRI (+) for post-op fluid collection/infection from T6-T11. Underwent wound exploration/debridement on 10/18. 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 Marcello Dixon' office to request an order for a PRAFO with a walking sole for the L foot to help with off-loading the heel and to protect the L heel during amb/mobility as well  Recommendations for follow up therapy are one component of a multi-disciplinary discharge planning process, led by the attending physician.  Recommendations may be updated based on patient status, additional functional criteria and insurance authorization.  Follow Up Recommendations  Skilled nursing-short term rehab (<3 hours/day)     Assistance Recommended at Discharge Intermittent Supervision/Assistance  Equipment Recommendations  Rolling walker (2 wheels)    Recommendations for Other Services       Precautions / Restrictions Precautions Precautions: Back Precaution Booklet Issued: No Precaution Comments: Thoracic Required Braces or Orthoses: Other Brace Spinal Brace: Other (comment);Applied in sitting position Spinal Brace Comments: Clamshell Other Brace: Clamshell Restrictions Weight Bearing Restrictions: No      Mobility  Bed Mobility Overal bed mobility: Needs Assistance Bed Mobility: Rolling;Sit to Supine Rolling: Modified independent (Device/Increase time) Sidelying to sit: Min assist       General bed mobility comments: some assist for LEs    Transfers Overall transfer level: Needs assistance Equipment used: Rolling walker (2 wheels) Transfers: Sit to/from Stand Sit to Stand: Min guard           General transfer comment: noting intially a struggle to stand; improved rise (esp liftoff) when he pushed with both UEs from armrests    Ambulation/Gait Ambulation/Gait assistance: Min guard (with phsyical contact) Gait Distance (Feet): 110 Feet (at least) Assistive device: Rolling walker (2 wheels) Gait Pattern/deviations: Decreased step length - right;Decreased step length - left;Trunk flexed;Decreased dorsiflexion - left (Pt was compliant with Pt education for standing up tall to reduce trunk flexion) Gait velocity: improved speed Gait velocity interpretation: <1.31 ft/sec, indicative of household ambulator General Gait Details: Second time walking today, and was happy to be moving again   Marine scientist [place] (Stroke Patients Only)       Balance     Sitting balance-Leahy Scale: Fair       Standing balance-Leahy Scale: Fair                              Cognition Arousal/Alertness: Awake/alert Behavior During Therapy: WFL for tasks assessed/performed Overall Cognitive Status: Within Functional Limits for tasks assessed  Exercises      General Comments General comments (skin integrity, edema, etc.): Observed the L heel where pressure wound is present to see progression of healing. Changed bandaging from this morning      Pertinent Vitals/Pain Pain Assessment: Faces Faces Pain Scale: Hurts even more Breathing:  (Felt when getting back in bed,  but was acute.) Pain Location: lower back Pain Descriptors / Indicators: Discomfort;Grimacing Pain Intervention(s): Monitored during session (Noted back pain that he experienced during the transition back to bed subsided quickly)    Home Living                          Prior Function            PT Goals (current goals can now be found in the care plan section) Acute Rehab PT Goals Patient Stated Goal: Get out of hospital PT Goal Formulation: With patient Time For Goal Achievement: 03/12/21 Potential to Achieve Goals: Good Progress towards PT goals: Progressing toward goals    Frequency    Min 3X/week      PT Plan Current plan remains appropriate;Frequency needs to be updated;Other (comment) (Decr frequency per dept policy, as he will get further rehab at SNF)    Co-evaluation              AM-PAC PT "6 Clicks" Mobility   Outcome Measure  Help needed turning from your back to your side while in a flat bed without using bedrails?: A Little Help needed moving from lying on your back to sitting on the side of a flat bed without using bedrails?: A Little Help needed moving to and from a bed to a chair (including a wheelchair)?: A Little Help needed standing up from a chair using your arms (e.g., wheelchair or bedside chair)?: A Little Help needed to walk in hospital room?: A Little Help needed climbing 3-5 steps with a railing? : A Lot 6 Click Score: 17    End of Session Equipment Utilized During Treatment: Gait belt;Back brace Activity Tolerance: Patient tolerated treatment well Patient left: in bed;with call bell/phone within reach Nurse Communication: Mobility status PT Visit Diagnosis: Other abnormalities of gait and mobility (R26.89);Unsteadiness on feet (R26.81);Pain Pain - Right/Left:  (midline) Pain - part of body:  (Back)     Time: 1300-1330 PT Time Calculation (min) (ACUTE ONLY): 30 min  Charges:  $Gait Training: 23-37 mins                      Keith Dixon, [place]  Acute Rehabilitation Services Pager [phone] Office [phone]    Keith Dixon 03/07/2021, 5:53 PM

## 2021-03-07 NOTE — Discharge Summary (Addendum)
Physician Discharge Summary  Patient ID: Keith Dixon MRN: [id number] DOB/AGE: 58/18/1962 58 y.o.  Admit date: 02/25/2021 Discharge date: 03/08/2021  Admission Diagnoses:  Wound dehiscence  Discharge Diagnoses:  Wound dehiscence Active Problems:   Post op infection   Discharged Condition: Stable  Hospital Course:  Keith Dixon is a 60 y.o. male who previously underwent L1-5 TLIF's and T10 to pelvis posterior fusion, whose postop course was complicated by DVT, respiratory failure, renal failure.  He required a wound revision for postoperative seroma.  He recovered in a subacute rehab but developed dehiscence of the superior aspect of his wound.  He was initially started on antibiotics at his skilled nursing facility and then was brought to the emergency room.  He had a left heel ulcer present as well.  In the ER, an MRI did not show any large fluid collections.  he was taken to the operating room for exploration and debridement.  Cultures were taken which returned as negative, and no obvious infection was seen.  A wound VAC was placed.  Although the patient's inflammatory markers were equivocal with essentially normal procalcitonin and CRP, given his requirement for 2 wound revisions, it was felt most prudent to treat empirically for infection.  Ceftriaxone was chosen as the antibiotic as from his first wound revision surgery, seroma/hematoma did grow some Klebsiella.  He had wound VAC changes Monday Wednesday Friday and his wound appeared to be healing by secondary intention well.  He worked with PT and OT.  He was deemed ready for discharge back to SNF.    Treatments: Surgery - exploration of wound with debridement, placement of wound vac  Discharge Exam: Blood pressure (!) 169/88, pulse 74, temperature 98.2 F (36.8 C), temperature source Oral, resp. rate 18, height 6' 1"  (1.854 m), weight 90.7 kg, SpO2 99 %. Awake, alert, oriented Speech fluent, appropriate CN grossly  intact 5/5 BUE/BLE Wound vac in place c/d/i  Disposition: Discharge disposition: 03-Skilled [place]       Discharge Instructions     [facility] pharmacist to adjust dose for Vancomycin, Aminoglycosides and other anti-infective therapies as requested by physician.   Complete by: As directed    [facility] to provide Cath Flo 25m   Complete by: As directed    Administer for PICC line occlusion and as ordered by physician for other access device issues.   Anaphylaxis Kit: Provided to treat any anaphylactic reaction to the medication being provided to the patient if First Dose or when requested by physician   Complete by: As directed    Epinephrine 130mml vial / amp: Administer 0.80m24m0.80ml41mubcutaneously once for moderate to severe anaphylaxis, nurse to call physician and pharmacy when reaction occurs and call 911 if needed for immediate care   Diphenhydramine 50mg38mIV vial: Administer 25-50mg 49mM PRN for first dose reaction, rash, itching, mild reaction, nurse to call physician and pharmacy when reaction occurs   Sodium Chloride 0.9% NS 500ml I5mdminister if needed for hypovolemic blood pressure drop or as ordered by physician after call to physician with anaphylactic reaction   Change dressing on IV access line weekly and PRN   Complete by: As directed    Flush IV access with Sodium Chloride 0.9% and Heparin 10 units/ml or 100 units/ml   Complete by: As directed    Home infusion instructions - [facility]   Complete by: As directed    Instructions: Flush IV access with Sodium Chloride 0.9% and Heparin  10units/ml or 100units/ml   Change dressing on IV access line: Weekly and PRN   Instructions Cath Flo 62m: Administer for PICC Line occlusion and as ordered by physician for other access device   [facility] pharmacist to adjust dose for: Vancomycin, Aminoglycosides and other anti-infective therapies as requested by physician    Incentive spirometry RT   Complete by: As directed    Method of administration may be changed at the discretion of home infusion pharmacist based upon assessment of the patient and/or caregiver's ability to self-administer the medication ordered   Complete by: As directed       Allergies as of 03/07/2021   No Known Allergies      Medication List     STOP taking these medications    doxycycline 100 MG tablet Commonly known as: VIBRA-TABS   levofloxacin 750 MG tablet Commonly known as: LEVAQUIN       TAKE these medications    acetaminophen 500 MG tablet Commonly known as: TYLENOL Take 2 tablets (1,000 mg total) by mouth every 6 (six) hours as needed for mild pain.   aspirin 81 MG chewable tablet Chew 1 tablet (81 mg total) by mouth daily.   cefTRIAXone  IVPB Commonly known as: ROCEPHIN Inject 2 g into the vein daily. Indication:  Osteomyelitis/Wound infection First Dose: Yes Last Day of Therapy:  04/10/21 Labs - Once weekly:  CBC/D and BMP, Labs - Every other week:  ESR and CRP Method of administration: IV Push Method of administration may be changed at the discretion of home infusion pharmacist based upon assessment of the patient and/or caregiver's ability to self-administer the medication ordered.   cyanocobalamin 1000 MCG tablet Take 1 tablet (1,000 mcg total) by mouth daily.   cyclobenzaprine 10 MG tablet Commonly known as: FLEXERIL Take 1 tablet (10 mg total) by mouth 3 (three) times daily as needed for muscle spasms. What changed: when to take this   D3-1000 25 MCG (1000 UT) tablet Generic drug: Cholecalciferol Take 2,000 Units by mouth 2 (two) times daily.   docusate sodium 100 MG capsule Commonly known as: COLACE Take 1 capsule (100 mg total) by mouth 2 (two) times daily as needed for mild constipation. What changed:  when to take this reasons to take this   enoxaparin 40 MG/0.4ML injection Commonly known as: LOVENOX Inject 0.4 mLs (40 mg  total) into the skin daily for 28 days.   Ensure Take 237 mLs by mouth 3 (three) times daily.   folic acid 1 MG tablet Commonly known as: FOLVITE Take 1 tablet (1 mg total) by mouth daily.   gabapentin 300 MG capsule Commonly known as: NEURONTIN Take 1 capsule (300 mg total) by mouth 2 (two) times daily.   hydrOXYzine 50 MG capsule Commonly known as: VISTARIL Take 50 mg by mouth in the morning and at bedtime.   Keppra 750 MG tablet Generic drug: levETIRAcetam Take 1,500 mg by mouth 2 (two) times daily.   LaMICtal 100 MG tablet Generic drug: lamoTRIgine Take 100 mg by mouth 2 (two) times daily.   Magnesium 400 MG Tabs Take 400 mg by mouth daily.   oxyCODONE-acetaminophen 5-325 MG tablet Commonly known as: Percocet Take 1 tablet by mouth every 4 (four) hours as needed for severe pain.   pantoprazole 40 MG tablet Commonly known as: PROTONIX Take 1 tablet (40 mg total) by mouth at bedtime.   QUEtiapine 200 MG tablet Commonly known as: SEROQUEL Take 200 mg by mouth at bedtime.  sertraline 100 MG tablet Commonly known as: ZOLOFT Take 100 mg by mouth daily after breakfast.   sodium bicarbonate 650 MG tablet Take 1 tablet (650 mg total) by mouth 3 (three) times daily.   zinc sulfate 220 (50 Zn) MG capsule Take 1 capsule (220 mg total) by mouth daily.               Discharge Care Instructions  (From admission, onward)           Start     Ordered   03/07/21 0000  Change dressing on IV access line weekly and PRN  (Home infusion instructions - [facility] )        03/07/21 1059            Follow-up Information     Vallarie Mare, MD Follow up in 2 week(s).   Specialty: Neurosurgery Contact information: [address]  [address] [phone]                 Signed: Vallarie Mare 03/07/2021, 11:03 AM

## 2021-03-07 NOTE — Progress Notes (Signed)
Mobility Specialist Progress Note:   03/07/21 1130  Mobility  Activity Ambulated in hall  Level of Assistance Minimal assist, patient does 75% or more  Assistive Device Front wheel walker  Distance Ambulated (ft) 220 ft  Mobility Ambulated with assistance in hallway  Mobility Response Tolerated well  Mobility performed by Mobility specialist  Bed Position Chair  $Mobility charge 1 Mobility   Required min-modA tp stand from EOB. Pt c/o BLE weakness/tremors during ambulation. Also hip pain. Sitting up in chair.   Nelta Numbers Mobility Specialist  Phone [phone]

## 2021-03-07 NOTE — Plan of Care (Signed)

## 2021-03-08 NOTE — Progress Notes (Signed)
Attempted to call [place], charge RN attempted once, and EMS attempted twice. Last call was answered but then disconnected. Unable to get a hold of anyone after. As patient came from facility EMS advised along with charge RN to allow pt to return to facility with RN phone number if report is requested.  Pt left A&O x4, on RA, vitals stable, and in good spirits. Was taken on stretcher with all belongings, and PICC line in place. Wound vac was disconnected from [facility] machine as instructed to allow facility to use their preferred wound vac machine.   Pt stated understanding of all discharge instructions and stated no pain or nausea at discharge.

## 2021-03-08 NOTE — Progress Notes (Signed)
Physical Therapy Note  Confirmed verbal order for walking PRAFO with Dr. Marcello Moores' office;  Contacted [facility] to assist with delivery and fitting of PRAFO prior to dc;  (Full PT session note to follow)  Roney Marion, [place] Pager [phone] Office [phone]

## 2021-03-08 NOTE — Plan of Care (Signed)
  Problem: Education: Goal: Required Educational Video(s) Outcome: Adequate for Discharge   Problem: Clinical Measurements: Goal: Ability to maintain clinical measurements within normal limits will improve Outcome: Adequate for Discharge Goal: Postoperative complications will be avoided or minimized Outcome: Adequate for Discharge   Problem: Skin Integrity: Goal: Demonstration of wound healing without infection will improve Outcome: Adequate for Discharge   Problem: Education: Goal: Knowledge of General Education information will improve Description: Including pain rating scale, medication(s)/side effects and non-pharmacologic comfort measures Outcome: Adequate for Discharge   Problem: Health Behavior/Discharge Planning: Goal: Ability to manage health-related needs will improve Outcome: Adequate for Discharge   Problem: Clinical Measurements: Goal: Ability to maintain clinical measurements within normal limits will improve Outcome: Adequate for Discharge Goal: Will remain free from infection Outcome: Adequate for Discharge Goal: Diagnostic test results will improve Outcome: Adequate for Discharge Goal: Respiratory complications will improve Outcome: Adequate for Discharge Goal: Cardiovascular complication will be avoided Outcome: Adequate for Discharge   Problem: Activity: Goal: Risk for activity intolerance will decrease Outcome: Adequate for Discharge   Problem: Nutrition: Goal: Adequate nutrition will be maintained Outcome: Adequate for Discharge   Problem: Coping: Goal: Level of anxiety will decrease Outcome: Adequate for Discharge   Problem: Elimination: Goal: Will not experience complications related to bowel motility Outcome: Adequate for Discharge Goal: Will not experience complications related to urinary retention Outcome: Adequate for Discharge   Problem: Pain Managment: Goal: General experience of comfort will improve Outcome: Adequate for Discharge    Problem: Safety: Goal: Ability to remain free from injury will improve Outcome: Adequate for Discharge   Problem: Skin Integrity: Goal: Risk for impaired skin integrity will decrease Outcome: Adequate for Discharge

## 2021-03-08 NOTE — Plan of Care (Signed)

## 2021-03-08 NOTE — Progress Notes (Signed)
Physical Therapy Treatment Patient Details Name: Keith Dixon MRN: [id number] DOB: 06-19-1960 Today's Date: 03/08/2021   History of Present Illness Pt is a 60 y.o. male who was admitted to [facility] on 10/17 for possible spinal infection.  MRI (+) for post-op fluid collection/infection from T6-T11. Underwent wound exploration/debridement on 10/18. 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   Recommendations for follow up therapy are one component of a multi-disciplinary discharge planning process, led by the attending physician.  Recommendations may be updated based on patient status, additional functional criteria and insurance authorization.  Follow Up Recommendations  Skilled nursing-short term rehab (<3 hours/day)     Assistance Recommended at Discharge Intermittent Supervision/Assistance  Equipment Recommendations  Rolling walker (2 wheels)    Recommendations for Other Services       Precautions / Restrictions Precautions Precautions: Back Required Braces or Orthoses: Spinal Brace Spinal Brace: Other (comment);Applied in sitting position Spinal Brace Comments: Clamshell     Mobility  Bed Mobility               General bed mobility comments: Recieved pt in chair Patient Response: Cooperative  Transfers Overall transfer level: Needs assistance Equipment used: Rolling walker (2  wheels) Transfers: Sit to/from Stand Sit to Stand: Min assist;Min guard           General transfer comment: noting intially a struggle to stand; improved rise (esp liftoff) when he pushed with both UEs from armrests    Ambulation/Gait Ambulation/Gait assistance: Min guard (with phsyical contact) Gait Distance (Feet): 140 Feet Assistive device: Rolling walker (2 wheels) Gait Pattern/deviations: Decreased step length - right;Decreased step length - left;Trunk flexed;Decreased dorsiflexion - left (Pt was compliant with Pt education for standing up tall to reduce trunk flexion) Gait velocity: improved speed   General Gait Details: Walked with walking PRAFO this session to look at splint fit and to assess with amb; While the bulk of the PRAFO did shift his gait into more assymetry, teh decr pain and pressure through L heel made up for it   Stairs             Wheelchair Mobility    Modified Rankin (Stroke Patients Only)       Balance     Sitting balance-Leahy Scale: Fair       Standing balance-Leahy Scale: Fair                              Cognition Arousal/Alertness: Awake/alert Behavior During Therapy: WFL for tasks assessed/performed Overall Cognitive Status: Within Functional Limits for tasks assessed                                 General Comments: 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        Exercises      General Comments General comments (skin integrity, edema, etc.): Obtained  OK for verbal order for walking PRAFO from Dr. Marcello Moores' office; Educated pt on importance of keeping pressure of of teh heel as much as possible for optimal healing      Pertinent Vitals/Pain Pain Assessment: Faces Faces Pain Scale: Hurts little more Pain Location: lower back Pain Descriptors / Indicators: Discomfort;Grimacing Pain Intervention(s): Monitored during session    Home Living                          Prior  Function            PT Goals (current goals can now be found in the care plan section) Acute Rehab PT Goals Patient Stated Goal: Get out of hospital PT Goal Formulation: With patient Time For Goal Achievement: 03/12/21 Potential to Achieve Goals: Good Progress towards PT goals: Progressing toward goals    Frequency    Min 3X/week      PT Plan Current plan remains appropriate    Co-evaluation              AM-PAC PT "6 Clicks" Mobility   Outcome Measure  Help needed turning from your back to your side while in a flat bed without using bedrails?: A Little Help needed moving from lying on your back to sitting on the side of a flat bed without using bedrails?: A Little Help needed moving to and from a bed to a chair (including a wheelchair)?: A Little Help needed standing up from a chair using your arms (e.g., wheelchair or bedside chair)?: A Little Help needed to walk in hospital room?: A Little Help needed climbing 3-5 steps with a railing? : A Lot 6 Click Score: 17    End of Session Equipment Utilized During Treatment: Gait belt;Back brace Activity Tolerance: Patient tolerated treatment well Patient left: with call bell/phone within reach;in chair;with chair alarm set Nurse Communication: Mobility status PT Visit Diagnosis: Other abnormalities of gait and mobility (R26.89);Unsteadiness on feet (R26.81);Pain Pain - Right/Left:  (midline) Pain - part of body:  (back)     Time:  -     Charges:                        Roney Marion, [place] Pager [phone] Office [place] 03/08/2021, 3:43 PM

## 2021-03-08 NOTE — TOC Transition Note (Signed)
Transition of Care [facility]) - CM/SW Discharge Note   Patient Details  Name: Keith Dixon MRN: [id number] Date of Birth: 11-28-1960  [facility]) CM/SW Contact:  Emeterio Reeve, LCSW Phone Number: 03/08/2021, 11:34 AM   Clinical Narrative:     Patient will DC to: [facility] Anticipated DC date: 03/08/21 Family notified: Guardian virginia Landry Mellow Transport by: Corey Harold     Per MD patient ready for DC to [facility]. RN, patient, patient's family, and facility notified of DC. Discharge Summary and FL2 sent to facility. DC packet on chart. Insurance Josem Kaufmann has been received and pt is covid negative. Ambulance transport requested for patient.    RN to call report to [phone], Room 128B.  CSW will sign off for now as social work intervention is no longer needed. Please consult [place] again if new needs arise.   Final next level of care: Skilled Nursing Facility Barriers to Discharge: Barriers Resolved   Patient Goals and CMS Choice Patient states their goals for this hospitalization and ongoing recovery are:: return to SNF CMS Medicare.gov Compare Post Acute Care list provided to:: Patient Choice offered to / list presented to : Patient  Discharge Placement              Patient chooses bed at: [facility] Patient to be transferred to facility by: [facility] Name of family member notified: Guardian, [place] Patient and family notified of of transfer: 03/08/21  Discharge Plan and Services                                     Social Determinants of Health (SDOH) Interventions     Readmission Risk Interventions Readmission Risk Prevention Plan 02/11/2021  Transportation Screening Complete  Medication Review Press photographer) Complete  PCP or Specialist appointment within 3-5 days of discharge Complete  HRI or Cataract Complete  SW Recovery Care/Counseling Consult Complete  [place] Complete   Some recent data might be hidden     Emeterio Reeve, LCSW Clinical Social Worker

## 2021-03-08 NOTE — Progress Notes (Signed)
Called to [facility] to give report, but nurse was not available. Left my number for nurse to call back when it was convenient.

## 2021-03-08 NOTE — Consult Note (Signed)
[place] Nurse wound follow up Vac dressing changed to full thickness post-op wound to middle back. Pt medicated for pain prior to the procedure and tolerated with minimal amt discomfort.  Wound bed: Slowly decreasing in size and depth; refer to previous notes for measurements, beefy red Drainage (amount, consistency, odor) minimal amt pink drainage in the cannister, periwound intact Dressing procedure/placement/frequency: Applied one piece of black foam to 167mm cont suction. Pt was laying on their back, so I bridged track pad away to his side to reduce pressure. Pt plans to discharge today. Thank-you,  Julien Girt MSN, [place], [place], [place], [place]

## 2021-03-08 NOTE — Progress Notes (Signed)
Mobility Specialist Progress Note:   03/08/21 1025  Mobility  Activity Ambulated in hall  Level of Assistance Minimal assist, patient does 75% or more  Assistive Device Front wheel walker  Distance Ambulated (ft) 250 ft  Mobility Ambulated with assistance in hallway  Mobility Response Tolerated well  Mobility performed by Mobility specialist  Bed Position Chair  $Mobility charge 1 Mobility   Pt c/o hip pain and RLE pain during ambulation. Pt left in chair, anticipating PT later today.   Nelta Numbers Mobility Specialist  Phone [phone]

## 2021-03-26 ENCOUNTER — Encounter (HOSPITAL_COMMUNITY): Payer: Self-pay

## 2021-03-26 ENCOUNTER — Other Ambulatory Visit: Payer: Self-pay

## 2021-03-26 ENCOUNTER — Emergency Department: Payer: Self-pay

## 2021-03-26 ENCOUNTER — Emergency Department (HOSPITAL_COMMUNITY)
Admission: EM | Admit: 2021-03-26 | Discharge: 2021-03-27 | Disposition: A | Discharge status: Home / Self Care | Payer: No Typology Code available for payment source | Attending: Emergency Medicine | Admitting: Emergency Medicine

## 2021-03-26 DIAGNOSIS — Z7901 Long term (current) use of anticoagulants: Secondary | ICD-10-CM | POA: Insufficient documentation

## 2021-03-26 DIAGNOSIS — I10 Essential (primary) hypertension: Secondary | ICD-10-CM | POA: Diagnosis not present

## 2021-03-26 DIAGNOSIS — X58XXXA Exposure to other specified factors, initial encounter: Secondary | ICD-10-CM | POA: Diagnosis not present

## 2021-03-26 DIAGNOSIS — T8242XA Displacement of vascular dialysis catheter, initial encounter: Secondary | ICD-10-CM | POA: Diagnosis present

## 2021-03-26 DIAGNOSIS — Z79899 Other long term (current) drug therapy: Secondary | ICD-10-CM | POA: Insufficient documentation

## 2021-03-26 DIAGNOSIS — Z7982 Long term (current) use of aspirin: Secondary | ICD-10-CM | POA: Insufficient documentation

## 2021-03-26 DIAGNOSIS — S41131A Puncture wound without foreign body of right upper arm, initial encounter: Secondary | ICD-10-CM | POA: Diagnosis not present

## 2021-03-26 DIAGNOSIS — Y712 Prosthetic and other implants, materials and accessory cardiovascular devices associated with adverse incidents: Secondary | ICD-10-CM | POA: Insufficient documentation

## 2021-03-26 DIAGNOSIS — T82524A Displacement of infusion catheter, initial encounter: Secondary | ICD-10-CM

## 2021-03-26 DIAGNOSIS — Z87891 Personal history of nicotine dependence: Secondary | ICD-10-CM | POA: Diagnosis not present

## 2021-03-26 MED ORDER — SODIUM CHLORIDE 0.9 % IV SOLN
2.0000 g | Freq: Once | INTRAVENOUS | Status: AC
  Administered 2021-03-26: 2 g via INTRAVENOUS
  Filled 2021-03-26: qty 20

## 2021-03-26 MED ORDER — SODIUM CHLORIDE 0.9% FLUSH
10.0000 mL | Freq: Two times a day (BID) | INTRAVENOUS | Status: DC
Start: 2021-03-26 — End: 2021-03-27
  Administered 2021-03-26: 20 mL

## 2021-03-26 MED ORDER — SODIUM CHLORIDE 0.9% FLUSH: 10.0000 mL | INTRAVENOUS | Status: DC | PRN

## 2021-03-26 NOTE — ED Triage Notes (Signed)
Pt presents to the ED from [facility] via [place] with complaints of his PICC line coming out. Pt states he was taking a shower and his PICC line came out. Pt denies any pain or complaints. Pt has osteomyelitis in his back with a wound vac in place, pt being treated with IV antibiotics at the facility.

## 2021-03-26 NOTE — Progress Notes (Signed)
Pt. Here for PICC placement , pt. From home and pulled out PICC by accident during shower. RN notified. To place order for PICC.

## 2021-03-26 NOTE — ED Notes (Signed)
IV team unable to provide PICC line service tonight. MD notified. Pt to be sent back to facility and the facility can place a outpatient appt for IR tomorrow. [facility] called for transport. Pt will be given dose of antibiotics prior to D/C. No acute changes noted. Report given to [facility] at facility and informed of plan. Will continue to monitor.
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
## 2021-03-26 NOTE — ED Notes (Signed)
ED Provider at bedside. 

## 2021-03-26 NOTE — ED Notes (Signed)
[facility] was called,13 in list

## 2021-03-26 NOTE — ED Notes (Signed)
IV team called back and gave the option for midline catheter instead of a PICC line. MD notified and agrees as long as the facility can use it. Facility called and per Olivia Mackie, they can use a midline catheter. Pt updated. Will continue to monitor. Pt continues to be on the wait list for [facility].

## 2021-03-27 NOTE — ED Notes (Signed)
Pt dressed and disconnected from monitor. Pt waiting for [facility] transport. Pt moved to hallway. Pt denies any complaints. Pt given apple juice per request. No acute changes noted. Will continue to monitor.

## 2021-03-28 ENCOUNTER — Ambulatory Visit: Payer: Medicare Other | Admitting: Infectious Disease

## 2021-08-13 ENCOUNTER — Encounter (HOSPITAL_BASED_OUTPATIENT_CLINIC_OR_DEPARTMENT_OTHER): Payer: Medicare Other | Attending: General Surgery | Admitting: Internal Medicine

## 2021-08-13 DIAGNOSIS — L89623 Pressure ulcer of left heel, stage 3: Secondary | ICD-10-CM | POA: Diagnosis present

## 2021-08-13 DIAGNOSIS — F319 Bipolar disorder, unspecified: Secondary | ICD-10-CM | POA: Insufficient documentation

## 2021-08-13 DIAGNOSIS — Z87891 Personal history of nicotine dependence: Secondary | ICD-10-CM | POA: Diagnosis not present

## 2021-08-13 DIAGNOSIS — F4310 Post-traumatic stress disorder, unspecified: Secondary | ICD-10-CM | POA: Diagnosis not present

## 2021-08-13 DIAGNOSIS — M1990 Unspecified osteoarthritis, unspecified site: Secondary | ICD-10-CM | POA: Insufficient documentation

## 2021-08-13 DIAGNOSIS — G40909 Epilepsy, unspecified, not intractable, without status epilepticus: Secondary | ICD-10-CM | POA: Insufficient documentation

## 2021-08-13 DIAGNOSIS — I10 Essential (primary) hypertension: Secondary | ICD-10-CM | POA: Insufficient documentation

## 2021-08-13 NOTE — Progress Notes (Signed)
Keith Dixon, Keith Dixon ([id number]) ?Visit Report for 08/13/2021 ?Chief Complaint Document Details ?Patient Name: Date of Service: ?Keith Dixon, Keith EL J. 08/13/2021 1:15 PM ?Medical Record Number: [id number] ?Patient Account Number: [id number] ?Date of Birth/Sex: Treating RN: ?Nov 05, 1960 (62 y.o. M) ?Primary Care Provider: Marjo Dixon, CO RA ZO Dixon Other Clinician: ?Referring Provider: ?Treating Provider/Extender: Keith Dixon ?Keith Dixon ?Weeks in Treatment: 0 ?Information Obtained from: Patient ?Chief Complaint ?08/13/2021; left heel wound ?Electronic Signature(s) ?Signed: 08/13/2021 3:51:06 PM By: Keith Shan DO ?Entered By: Keith Dixon on 08/13/2021 14:39:03 ?-------------------------------------------------------------------------------- ?Debridement Details ?Patient Name: Date of Service: ?Keith Dixon, Keith EL J. 08/13/2021 1:15 PM ?Medical Record Number: [id number] ?Patient Account Number: [id number] ?Date of Birth/Sex: Treating RN: ?06/14/1960 (61 y.o. [name] ?Primary Care Provider: Marjo Dixon, CO RA ZO Dixon Other Clinician: ?Referring Provider: ?Treating Provider/Extender: Keith Dixon ?Keith Dixon ?Weeks in Treatment: 0 ?Debridement Performed for Assessment: Wound #1 Left Calcaneus ?Performed By: Physician Keith Shan, DO ?Debridement Type: Debridement ?Level of Consciousness (Pre-procedure): Awake and Alert ?Pre-procedure Verification/Time Out Yes - 14:25 ?Taken: ?Start Time: 14:26 ?Pain Control: ?Other : Benzocaine ?T Area Debrided (L x W): ?otal 1.7 (cm) x 1.2 (cm) = 2.04 (cm?) ?Tissue and other material debrided: Non-Viable, Callus, Slough, Subcutaneous, Slough ?Level: Skin/Subcutaneous Tissue ?Debridement Description: Excisional ?Instrument: Curette ?Bleeding: Minimum ?Hemostasis Achieved: Pressure ?End Time: 14:29 ?Response to Treatment: Procedure was tolerated well ?Level of Consciousness (Post- Awake and Alert ?procedure): ?Post Debridement Measurements of Total Wound ?Length: (cm)  1.7 ?Stage: Category/Stage III ?Width: (cm) 1.2 ?Depth: (cm) 0.5 ?Volume: (cm?) 0.801 ?Character of Wound/Ulcer Post Debridement: Stable ?Post Procedure Diagnosis ?Same as Pre-procedure ?Electronic Signature(s) ?Signed: 08/13/2021 3:51:06 PM By: Keith Shan DO ?Signed: 08/13/2021 4:45:45 PM By: Keith Dixon ?Entered By: Keith Dixon on 08/13/2021 14:29:14 ?-------------------------------------------------------------------------------- ?HPI Details ?Patient Name: Date of Service: ?Keith Dixon, Keith EL J. 08/13/2021 1:15 PM ?Medical Record Number: [id number] ?Patient Account Number: [id number] ?Date of Birth/Sex: Treating RN: ?[birth year] (61 y.o. M) ?Primary Care Provider: Marjo Dixon, CO RA ZO Dixon Other Clinician: ?Referring Provider: ?Treating Provider/Extender: Keith Dixon ?Keith Dixon ?Weeks in Treatment: 0 ?History of Present Illness ?HPI Description: Admission 08/13/2021 ?Mr. Keith Dixon is a 61 year old male with a past medical history of seizure disorder, PTSD, bipolar disorder that presents to the clinic for a 58-monthhistory ?of nonhealing ulcer to the left heel. He states that he had back surgery in August 2022 and he was transferred to a rehab facility post-surgery. He states that ?he developed a heel wound during his stay. He states this started out with an eschar and has slowly improved over time. He has home health that comes out 3 ?times weekly and he reports using collagen to the area. He is using crocs to help with offloading. He currently denies signs of infection. ?Electronic Signature(s) ?Signed: 08/13/2021 3:51:06 PM By: Keith ShanDO ?Entered By: Keith Dixon 08/13/2021 14:42:28 ?-------------------------------------------------------------------------------- ?Physical Exam Details ?Patient Name: Date of Service: ?[name] Keith EL J. 08/13/2021 1:15 PM ?Medical Record Number: [id number]?Patient Account Number: [id number]?Date of Birth/Sex: Treating RN: ?[birth year]((61y.o. M) ?Primary  Care Provider: MMarjo Dixon CO RA ZO Dixon Other Clinician: ?Referring Provider: ?Treating Provider/Extender: Keith Dixon?Keith Dixon ?Weeks in Treatment: 0 ?Constitutional ?respirations regular, non-labored and within target range for patient..Marland Kitchen?Cardiovascular ?2+ dorsalis pedis/posterior tibialis pulses. ?Psychiatric ?pleasant and cooperative. ?Notes ?Left heel: Open wound with granulation tissue and nonviable tissue present. No surrounding signs of infection. ?Electronic Signature(s) ?Signed:  08/13/2021 3:51:06 PM By: Keith Shan DO ?Entered By: Keith Dixon on 08/13/2021 14:43:04 ?-------------------------------------------------------------------------------- ?Physician Orders Details ?Patient Name: ?Date of Service: ?Keith Dixon, Keith EL J. 08/13/2021 1:15 PM ?Medical Record Number: [id number] ?Patient Account Number: [id number] ?Date of Birth/Sex: ?Treating RN: ?1960-05-22 (61 y.o. [name] ?Primary Care Provider: MULLES, CO RA ZO Dixon ?Other Clinician: ?Referring Provider: ?Treating Provider/Extender: Keith Dixon ?Keith Dixon ?Weeks in Treatment: 0 ?Verbal / Phone Orders: No ?Diagnosis Coding ?ICD-10 Coding ?Code Description ?W09.811 Pressure ulcer of left heel, stage 3 ?G40.909 Epilepsy, unspecified, not intractable, without status epilepticus ?F43.10 Post-traumatic stress disorder, unspecified ?Follow-up Appointments ?ppointment in 2 weeks. - with Dr. Heber Yauco Leveda Dixon, Room 7) ?Return A ?Other: - **Ask HH if still using Flagyl in wound** ?Bathing/ Shower/ Hygiene ?May shower and wash wound with soap and water. - when changing dressing ?Off-Loading ?Open toe surgical shoe to: - to left foot ?Other: - Float heels ?Additional Orders / Instructions ?Follow Nutritious Diet ?Home Health ?New wound care orders this week; continue Home Health for wound care. May utilize formulary equivalent dressing for wound treatment ?orders unless otherwise specified. ?Other Home Health Orders/Instructions: [facility]: skilled nursing for wound care 2-3x week. ?Wound Treatment ?Wound #1 - Calcaneus Wound Laterality: Left ?Cleanser: Soap and Water (Home Health) 3 x Per Week/30 Days ?Discharge Instructions: May shower and wash wound with dial antibacterial soap and water prior to dressing change. ?Cleanser: Wound Cleanser (Home Health) 3 x Per Week/30 Days ?Discharge Instructions: Cleanse the wound with wound cleanser prior to applying a clean dressing using gauze sponges, not tissue or cotton balls. ?Prim Dressing: Promogran Prisma Matrix, 4.34 (sq in) (silver collagen) (Home Health) 3 x Per Week/30 Days ?ary ?Discharge Instructions: Moisten collagen with saline or hydrogel ?Secondary Dressing: Woven Gauze Sponge, Non-Sterile 4x4 in (Home Health) 3 x Per Week/30 Days ?Discharge Instructions: Apply over primary dressing as directed. ?Secondary Dressing: ALLEVYN Heel 4 1/2in x 5 1/2in / 10.5cm x 13.5cm (Home Health) 3 x Per Week/30 Days ?Discharge Instructions: Apply over primary dressing as directed. ?Secured With: [employer], 4.5x3.1 (in/yd) (Home Health) 3 x Per Week/30 Days ?Discharge Instructions: Secure with Kerlix as directed. ?Secured With: 11M Medipore H Soft Cloth Surgical T ape, 4 x 10 (in/yd) (Home Health) 3 x Per Week/30 Days ?Discharge Instructions: Secure with tape as directed. ?Electronic Signature(s) ?Signed: 08/13/2021 3:51:06 PM By: Keith Shan DO ?Entered By: Keith Dixon on 08/13/2021 14:43:26 ?-------------------------------------------------------------------------------- ?Problem List Details ?Patient Name: Date of Service: ?Keith Dixon, Keith EL J. 08/13/2021 1:15 PM ?Medical Record Number: [id number] ?Patient Account Number: [id number] ?Date of Birth/Sex: Treating RN: ?1960-08-12 (61 y.o. M) ?Primary Care Provider: Marjo Dixon, CO RA ZO Dixon Other Clinician: ?Referring Provider: ?Treating Provider/Extender: Keith Dixon ?Keith Dixon ?Weeks in Treatment: 0 ?Active  Problems ?ICD-10 ?Encounter ?Code Description Active Date MDM ?Diagnosis ?O13.086 Pressure ulcer of left heel, stage 3 08/13/2021 No Yes ?G40.909 Epilepsy, unspecified, not intractable, without status epilepticus 08/13/2021 No Yes ?F43.1

## 2021-08-13 NOTE — Progress Notes (Signed)
[name], [name] ([id number]) ?Visit Report for 08/13/2021 ?Allergy List Details ?Patient Name: Date of Service: ?Keith Dixon, Keith EL J. 08/13/2021 Dixon:15 PM ?Medical Record Number: [id number] ?Patient Account Number: [id number] ?Date of Birth/Sex: Treating RN: ?[birth year] (61 y.o. Keith Dixon ?Primary Care Keith Dixon: Keith Dixon, CO RA ZO Dixon Other Clinician: ?Keith Dixon: ?Treating Keith Dixon/Extender: Keith Dixon ?Keith Dixon ?Weeks in Treatment: 0 ?Allergies ?Active Allergies ?No Known Allergies ?Allergy Notes ?Electronic Signature(s) ?Signed: 08/13/2021 4:45:45 PM By: Keith Dixon ?Entered By: Keith Dixon on 08/13/2021 13:35:39 ?-------------------------------------------------------------------------------- ?Arrival Information Details ?Patient Name: Date of Service: ?Keith Dixon, Keith EL J. 08/13/2021 Dixon:15 PM ?Medical Record Number: [id number] ?Patient Account Number: [id number] ?Date of Birth/Sex: Treating RN: ?[birth year] (61 y.o. Keith Dixon ?Primary Care Keith Dixon: Keith Dixon, CO RA ZO Dixon Other Clinician: ?Keith Dixon: ?Treating Keith Dixon/Extender: Keith Dixon ?Keith Dixon ?Weeks in Treatment: 0 ?Visit Information ?Patient Arrived: Keith Dixon ?Arrival Time: 13:30 ?Accompanied By: daughter ?Transfer Assistance: None ?Patient Identification Verified: Yes ?Secondary Verification Process Completed: Yes ?Patient Requires Transmission-Based Precautions: No ?Patient Has Alerts: Yes ?Patient Alerts: Patient on Blood Thinner ?Electronic Signature(s) ?Signed: 08/13/2021 4:45:45 PM By: Keith Dixon ?Entered By: Keith Dixon on 08/13/2021 13:34:59 ?-------------------------------------------------------------------------------- ?Clinic Level of Care Assessment Details ?Patient Name: Date of Service: ?Keith Dixon, Keith EL J. 08/13/2021 Dixon:15 PM ?Medical Record Number: [id number] ?Patient Account Number: [id number] ?Date of Birth/Sex: Treating RN: ?[birth year] (61 y.o. Keith Dixon ?Primary Care Keith Dixon:  Keith Dixon, CO RA ZO Dixon Other Clinician: ?Keith Dixon: ?Treating Keith Dixon/Extender: Keith Dixon ?Keith Dixon ?Weeks in Treatment: 0 ?Clinic Level of Care Assessment Items ?TOOL Dixon Quantity Score ?X- Dixon 0 ?Use when EandM and Procedure is performed on INITIAL visit ?ASSESSMENTS - Nursing Assessment / Reassessment ?X- Dixon 20 ?General Physical Exam (combine w/ comprehensive assessment (listed just below) when performed on new pt. evals) ?X- Dixon 25 ?Comprehensive Assessment (HX, ROS, Risk Assessments, Wounds Hx, etc.) ?ASSESSMENTS - Wound and Skin Assessment / Reassessment ?'[]'$  - 0 ?Dermatologic / Skin Assessment (not related to wound area) ?ASSESSMENTS - Ostomy and/or Continence Assessment and Care ?'[]'$  - 0 ?Incontinence Assessment and Management ?'[]'$  - 0 ?Ostomy Care Assessment and Management (repouching, etc.) ?PROCESS - Coordination of Care ?'[]'$  - 0 ?Simple Patient / Family Education for ongoing care ?X- Dixon 20 ?Complex (extensive) Patient / Family Education for ongoing care ?X- Dixon 10 ?Staff obtains Consents, Records, T Results / Process Orders ?est ?X- Dixon 10 ?Staff telephones HHA, Nursing Homes / Clarify orders / etc ?'[]'$  - 0 ?Routine Transfer to another Facility (non-emergent condition) ?'[]'$  - 0 ?Routine Hospital Admission (non-emergent condition) ?'[]'$  - 0 ?New Admissions / Biomedical engineer / Ordering NPWT Apligraf, etc. ?, ?'[]'$  - 0 ?Emergency Hospital Admission (emergent condition) ?PROCESS - Special Needs ?'[]'$  - 0 ?Pediatric / Minor Patient Management ?'[]'$  - 0 ?Isolation Patient Management ?'[]'$  - 0 ?Hearing / Language / Visual special needs ?'[]'$  - 0 ?Assessment of Community assistance (transportation, D/C planning, etc.) ?'[]'$  - 0 ?Additional assistance / Altered mentation ?'[]'$  - 0 ?Support Surface(s) Assessment (bed, cushion, seat, etc.) ?INTERVENTIONS - Miscellaneous ?'[]'$  - 0 ?External ear exam ?'[]'$  - 0 ?Patient Transfer (multiple staff / Civil Service fast streamer / Similar devices) ?'[]'$  - 0 ?Simple Staple / Suture removal (25 or  less) ?'[]'$  - 0 ?Complex Staple / Suture removal (26 or more) ?'[]'$  - 0 ?Hypo/Hyperglycemic Management (do not check if billed separately) ?X- Dixon 15 ?Ankle / Brachial Index (ABI) - do not check if billed separately ?Has  the patient been seen at the hospital within the last three years: Yes ?Total Score: 100 ?Level Of Care: New/Established - Level 3 ?Electronic Signature(s) ?Signed: 08/13/2021 4:45:45 PM By: Keith Dixon ?Entered By: Keith Dixon on 08/13/2021 14:39:05 ?-------------------------------------------------------------------------------- ?Encounter Discharge Information Details ?Patient Name: ?Date of Service: ?Keith Dixon, Keith EL J. 08/13/2021 Dixon:15 PM ?Medical Record Number: [id number] ?Patient Account Number: [id number] ?Date of Birth/Sex: ?Treating RN: ?Keith Dixon (61 y.o. Keith Dixon ?Primary Care Keith Dixon: Keith Dixon ?Other Clinician: ?Keith Dixon: ?Treating Keith Dixon/Extender: Keith Dixon ?Keith Dixon ?Weeks in Treatment: 0 ?Encounter Discharge Information Items Post Procedure Vitals ?Discharge Condition: Stable ?Temperature (F): 98.4 ?Ambulatory Status: Keith Dixon ?Pulse (bpm): 82 ?Discharge Destination: Home ?Respiratory Rate (breaths/min): 18 ?Transportation: Private Auto ?Blood Pressure (mmHg): 127/81 ?Accompanied By: Daughter ?Schedule Follow-up Appointment: Yes ?Clinical Summary of Care: Provided on 08/13/2021 ?Form Type Recipient ?Paper Patient Patient ?Electronic Signature(s) ?Signed: 08/13/2021 3:22:49 PM By: Keith Dixon ?Entered By: Keith Dixon on 08/13/2021 15:22:48 ?-------------------------------------------------------------------------------- ?Lower Extremity Assessment Details ?Patient Name: ?Date of Service: ?Keith Dixon, Keith EL J. 08/13/2021 Dixon:15 PM ?Medical Record Number: [id number] ?Patient Account Number: [id number] ?Date of Birth/Sex: ?Treating RN: ?Keith 25, Dixon (61 y.o. Keith Dixon ?Primary Care Sherley Leser: Keith Dixon ?Other Clinician: ?Keith  Gabryel Files: ?Treating Catheryne Deford/Extender: Keith Dixon ?Keith Dixon ?Weeks in Treatment: 0 ?Edema Assessment ?Assessed: [Left: Yes] [Right: No] ?Edema: [Left: Ye] [Right: s] ?Calf ?Left: Right: ?Point of Measurement: 35 cm From Medial Instep 37.4 cm ?Ankle ?Left: Right: ?Point of Measurement: 11 cm From Medial Instep 26 cm ?Knee To Floor ?Left: Right: ?From Medial Instep 45 cm ?Vascular Assessment ?Pulses: ?Dorsalis Pedis ?Palpable: [Left:Yes] ?Blood Pressure: ?Brachial: [Left:127] ?Ankle: ?[Left:Dorsalis Pedis: 69 0.55] ?Electronic Signature(s) ?Signed: 08/13/2021 4:45:45 PM By: Keith Dixon ?Entered By: Keith Dixon on 08/13/2021 13:56:55 ?-------------------------------------------------------------------------------- ?Multi Wound Chart Details ?Patient Name: ?Date of Service: ?Keith Dixon, Keith EL J. 08/13/2021 Dixon:15 PM ?Medical Record Number: [id number] ?Patient Account Number: [id number] ?Date of Birth/Sex: ?Treating RN: ?[birth year]Dixon (61 y.o. M) ?Primary Care Pansie Guggisberg: Keith Dixon ?Other Clinician: ?Keith Dyshawn Cangelosi: ?Treating Baptiste Littler/Extender: Keith Dixon ?Keith Dixon ?Weeks in Treatment: 0 ?Vital Signs ?Height(in): 72 ?Pulse(bpm): 82 ?Weight(lbs): 234 ?Blood Pressure(mmHg): 127/81 ?Body Mass Index(BMI): 31.7 ?Temperature(??F): 98.4 ?Respiratory Rate(breaths/min): 18 ?Photos: [Dixon/A:Dixon/A] ?Left Calcaneus Dixon/A Dixon/A ?Wound Location: ?Pressure Injury Dixon/A Dixon/A ?Wounding Event: ?Pressure Ulcer Dixon/A Dixon/A ?Primary Etiology: ?Sleep Apnea, Hypertension, Dixon/A Dixon/A ?Comorbid History: ?Osteoarthritis, Seizure Disorder ?04/15/2021 Dixon/A Dixon/A ?Date Acquired: ?0 Dixon/A Dixon/A ?Weeks of Treatment: ?Open Dixon/A Dixon/A ?Wound Status: ?No Dixon/A Dixon/A ?Wound Recurrence: ?Dixon.7x1.2x0.5 Dixon/A Dixon/A ?Measurements L x W x D (cm) ?Dixon.602 Dixon/A Dixon/A ?A (cm?) : ?rea ?0.801 Dixon/A Dixon/A ?Volume (cm?) : ?0.00% Dixon/A Dixon/A ?% Reduction in A rea: ?0.00% Dixon/A Dixon/A ?% Reduction in Volume: ?Category/Stage III Dixon/A Dixon/A ?Classification: ?Medium Dixon/A Dixon/A ?Exudate A  mount: ?Serosanguineous Dixon/A Dixon/A ?Exudate Type: ?red, brown Dixon/A Dixon/A ?Exudate Color: ?Well defined, not attached Dixon/A Dixon/A ?Wound Margin: ?Medium (34-66%) Dixon/A Dixon/A ?Granulation A mount: ?Red, Pink Dixon/A Dixon/A ?Granulation Quality: ?Medi

## 2021-08-13 NOTE — Progress Notes (Signed)
CORDIE, BUENING ([id number]) ?Visit Report for 08/13/2021 ?Abuse Risk Screen Details ?Patient Name: Date of Service: ?Keith Dixon, Keith EL J. 08/13/2021 1:15 PM ?Medical Record Number: [id number] ?Patient Account Number: [id number] ?Date of Birth/Sex: Treating RN: ?04-May-1961 (61 y.o. [name] ?Primary Care Saed Hudlow: Marjo Bicker, CO RA ZO N Other Clinician: ?Referring Ruby Logiudice: ?Treating Haruo Stepanek/Extender: Kalman Shan ?MULLES, CO RA ZO N ?Weeks in Treatment: 0 ?Abuse Risk Screen Items ?Answer ?ABUSE RISK SCREEN: ?Has anyone close to you tried to hurt or harm you recentlyo No ?Do you feel uncomfortable with anyone in your familyo No ?Has anyone forced you do things that you didnt want to doo No ?Electronic Signature(s) ?Signed: 08/13/2021 4:45:45 PM By: Lorrin Jackson ?Entered By: Lorrin Jackson on 08/13/2021 13:43:32 ?-------------------------------------------------------------------------------- ?Activities of Daily Living Details ?Patient Name: Date of Service: ?Keith Dixon, Keith EL J. 08/13/2021 1:15 PM ?Medical Record Number: [id number] ?Patient Account Number: [id number] ?Date of Birth/Sex: Treating RN: ?1960/10/23 (61 y.o. [name] ?Primary Care Danijah Noh: Marjo Bicker, CO RA ZO N Other Clinician: ?Referring Meghin Thivierge: ?Treating Lonie Rummell/Extender: Kalman Shan ?MULLES, CO RA ZO N ?Weeks in Treatment: 0 ?Activities of Daily Living Items ?Answer ?Activities of Daily Living (Please select one for each item) ?Drive Automobile Not Able ?T Medications ?ake Completely Able ?Use T elephone Completely Able ?Care for Appearance Completely Able ?Use T oilet Completely Able ?Bath / Shower Completely Able ?Dress Self Completely Able ?Feed Self Completely Able ?Walk Need Assistance ?Get In / Out Bed Completely Able ?Housework Completely Able ?Prepare Meals Completely Able ?Handle Money Completely Able ?Shop for Self Need Assistance ?Electronic Signature(s) ?Signed: 08/13/2021 4:45:45 PM By: Lorrin Jackson ?Entered By: Lorrin Jackson on 08/13/2021 13:44:16 ?-------------------------------------------------------------------------------- ?Education Screening Details ?Patient Name: ?Date of Service: ?Keith Dixon, Keith EL J. 08/13/2021 1:15 PM ?Medical Record Number: [id number] ?Patient Account Number: [id number] ?Date of Birth/Sex: ?Treating RN: ?[birth year] (61 y.o. [name] ?Primary Care Georgann Bramble: MULLES, CO RA ZO N ?Other Clinician: ?Referring Myrka Sylva: ?Treating Brianni Manthe/Extender: Kalman Shan ?MULLES, CO RA ZO N ?Weeks in Treatment: 0 ?Primary Learner Assessed: Patient ?Learning Preferences/Education Level/Primary Language ?Learning Preference: Explanation, Demonstration, Printed Material ?Highest Education Level: High School ?Preferred Language: English ?Cognitive Barrier ?Language Barrier: No ?Translator Needed: No ?Memory Deficit: No ?Emotional Barrier: No ?Cultural/Religious Beliefs Affecting Medical Care: No ?Physical Barrier ?Impaired Vision: No ?Impaired Hearing: No ?Decreased Hand dexterity: No ?Knowledge/Comprehension ?Knowledge Level: Medium ?Comprehension Level: Medium ?Ability to understand written instructions: High ?Ability to understand verbal instructions: High ?Motivation ?Anxiety Level: Calm ?Cooperation: Cooperative ?Education Importance: Acknowledges Need ?Interest in Health Problems: Asks Questions ?Perception: Coherent ?Willingness to Engage in Self-Management High ?Activities: ?Readiness to Engage in Self-Management High ?Activities: ?Electronic Signature(s) ?Signed: 08/13/2021 4:45:45 PM By: Lorrin Jackson ?Entered By: Lorrin Jackson on 08/13/2021 13:44:57 ?-------------------------------------------------------------------------------- ?Fall Risk Assessment Details ?Patient Name: ?Date of Service: ?Keith Dixon, Keith EL J. 08/13/2021 1:15 PM ?Medical Record Number: [id number] ?Patient Account Number: [id number] ?Date of Birth/Sex: ?Treating RN: ?1960-07-31 (61 y.o. [name] ?Primary Care Nemesio Castrillon: MULLES, CO RA  ZO N ?Other Clinician: ?Referring Jamichael Knotts: ?Treating Luisfelipe Engelstad/Extender: Kalman Shan ?MULLES, CO RA ZO N ?Weeks in Treatment: 0 ?Fall Risk Assessment Items ?Have you had 2 or more falls in the last 12 monthso 0 No ?Have you had any fall that resulted in injury in the last 12 monthso 0 No ?FALLS RISK SCREEN ?History of falling - immediate or within 3 months 25 Yes ?Secondary diagnosis (Do you have 2 or more medical diagnoseso) 0 No ?Ambulatory aid ?None/bed rest/wheelchair/nurse 0 No ?Crutches/cane/walker  15 Yes ?Furniture 0 No ?Intravenous therapy Access/Saline/Heparin Lock 0 No ?Gait/Transferring ?Normal/ bed rest/ wheelchair 0 Yes ?Weak (short steps with or without shuffle, stooped but able to lift head while walking, may seek 0 No ?support from furniture) ?Impaired (short steps with shuffle, may have difficulty arising from chair, head down, impaired 0 No ?balance) ?Mental Status ?Oriented to own ability 0 Yes ?Electronic Signature(s) ?Signed: 08/13/2021 4:45:45 PM By: Lorrin Jackson ?Entered By: Lorrin Jackson on 08/13/2021 13:45:15 ?-------------------------------------------------------------------------------- ?Foot Assessment Details ?Patient Name: ?Date of Service: ?Keith Dixon, Keith EL J. 08/13/2021 1:15 PM ?Medical Record Number: [id number] ?Patient Account Number: [id number] ?Date of Birth/Sex: ?Treating RN: ?02-23-1961 (61 y.o. [name] ?Primary Care Prarthana Parlin: MULLES, CO RA ZO N ?Other Clinician: ?Referring Arvine Clayburn: ?Treating Trai Ells/Extender: Kalman Shan ?MULLES, CO RA ZO N ?Weeks in Treatment: 0 ?Foot Assessment Items ?Site Locations ?+ = Sensation present, - = Sensation absent, C = Callus, U = Ulcer ?R = Redness, W = Warmth, M = Maceration, PU = Pre-ulcerative lesion ?F = Fissure, S = Swelling, D = Dryness ?Assessment ?Right: Left: ?Other Deformity: No No ?Prior Foot Ulcer: No No ?Prior Amputation: No No ?Charcot Joint: No No ?Ambulatory Status: Ambulatory With Help ?Assistance Device:  Walker ?Gait: Steady ?Electronic Signature(s) ?Signed: 08/13/2021 4:45:45 PM By: Lorrin Jackson ?Entered By: Lorrin Jackson on 08/13/2021 13:50:25 ?-------------------------------------------------------------------------------- ?Nutrition Risk Screening Details ?Patient Name: ?Date of Service: ?Keith Dixon, Keith EL J. 08/13/2021 1:15 PM ?Medical Record Number: [id number] ?Patient Account Number: [id number] ?Date of Birth/Sex: ?Treating RN: ?Aug 17, 1960 (61 y.o. [name] ?Primary Care Kealy Lewter: MULLES, CO RA ZO N ?Other Clinician: ?Referring Kesley Mullens: ?Treating Adric Wrede/Extender: Kalman Shan ?MULLES, CO RA ZO N ?Weeks in Treatment: 0 ?Height (in): 72 ?Weight (lbs): 234 ?Body Mass Index (BMI): 31.7 ?Nutrition Risk Screening Items ?Score Screening ?NUTRITION RISK SCREEN: ?I have an illness or condition that made me change the kind and/or amount of food I eat 0 No ?I eat fewer than two meals per day 0 No ?I eat few fruits and vegetables, or milk products 0 No ?I have three or more drinks of beer, liquor or wine almost every day 0 No ?I have tooth or mouth problems that make it hard for me to eat 0 No ?I don't always have enough money to buy the food I need 0 No ?I eat alone most of the time 0 No ?I take three or more different prescribed or over-the-counter drugs a day 1 Yes ?Without wanting to, I have lost or gained 10 pounds in the last six months 0 No ?I am not always physically able to shop, cook and/or feed myself 0 No ?Nutrition Protocols ?Good Risk Protocol 0 No interventions needed ?Moderate Risk Protocol ?High Risk Proctocol ?Risk Level: Good Risk ?Score: 1 ?Electronic Signature(s) ?Signed: 08/13/2021 4:45:45 PM By: Lorrin Jackson ?Entered By: Lorrin Jackson on 08/13/2021 13:45:25 ?

## 2021-08-27 ENCOUNTER — Encounter (HOSPITAL_BASED_OUTPATIENT_CLINIC_OR_DEPARTMENT_OTHER): Payer: Medicare Other | Admitting: Internal Medicine

## 2021-09-02 ENCOUNTER — Encounter (HOSPITAL_BASED_OUTPATIENT_CLINIC_OR_DEPARTMENT_OTHER): Payer: Medicare Other | Admitting: Internal Medicine

## 2021-09-02 DIAGNOSIS — L89623 Pressure ulcer of left heel, stage 3: Secondary | ICD-10-CM

## 2021-09-02 DIAGNOSIS — G40909 Epilepsy, unspecified, not intractable, without status epilepticus: Secondary | ICD-10-CM | POA: Diagnosis not present

## 2021-09-02 DIAGNOSIS — F4310 Post-traumatic stress disorder, unspecified: Secondary | ICD-10-CM

## 2021-09-02 NOTE — Progress Notes (Signed)
LONNEY, REVAK ([id number]) ?Visit Report for 09/02/2021 ?Arrival Information Details ?Patient Name: Date of Service: ?Keith Dixon, Keith EL J. 09/02/2021 9:45 A M ?Medical Record Number: [id number] ?Patient Account Number: [id number] ?Date of Birth/Sex: Treating RN: ?28-Oct-1960 (61 y.o. Burnadette Pop, Lauren ?Primary Care Kimia Finan: Reubin Milan Other Clinician: ?Referring Tahliyah Anagnos: ?Treating Mabel Roll/Extender: Kalman Shan ?Mulles, Corazon ?Weeks in Treatment: 2 ?Visit Information History Since Last Visit ?Added or deleted any medications: No ?Patient Arrived: [name] ?Any new allergies or adverse reactions: No ?Arrival Time: 10:07 ?Had a fall or experienced change in No ?Accompanied By: family ?activities of daily living that may affect ?Transfer Assistance: Manual ?risk of falls: ?Patient Identification Verified: Yes ?Signs or symptoms of abuse/neglect since last visito No ?Secondary Verification Process Completed: Yes ?Hospitalized since last visit: No ?Patient Requires Transmission-Based Precautions: No ?Implantable device outside of the clinic excluding No ?Patient Has Alerts: Yes ?cellular tissue based products placed in the center ?Patient Alerts: Patient on Blood Thinner since last visit: ?Has Dressing in Place as Prescribed: Yes ?Pain Present Now: No ?Electronic Signature(s) ?Signed: 09/02/2021 12:30:28 PM By: Rhae Hammock RN ?Entered By: Rhae Hammock on 09/02/2021 10:07:56 ?-------------------------------------------------------------------------------- ?Clinic Level of Care Assessment Details ?Patient Name: Date of Service: ?Keith Dixon, Keith EL J. 09/02/2021 9:45 A M ?Medical Record Number: [id number] ?Patient Account Number: [id number] ?Date of Birth/Sex: Treating RN: ?06/02/1960 (61 y.o. Burnadette Pop, Lauren ?Primary Care Zarahi Fuerst: Reubin Milan Other Clinician: ?Referring Mikhia Dusek: ?Treating Silviano Neuser/Extender: Kalman Shan ?Mulles, Corazon ?Weeks in Treatment: 2 ?Clinic Level of Care Assessment  Items ?TOOL 4 Quantity Score ?X- 1 0 ?Use when only an EandM is performed on FOLLOW-UP visit ?ASSESSMENTS - Nursing Assessment / Reassessment ?X- 1 10 ?Reassessment of Co-morbidities (includes updates in patient status) ?X- 1 5 ?Reassessment of Adherence to Treatment Plan ?ASSESSMENTS - Wound and Skin A ssessment / Reassessment ?X - Simple Wound Assessment / Reassessment - one wound 1 5 ?'[]'$  - 0 ?Complex Wound Assessment / Reassessment - multiple wounds ?'[]'$  - 0 ?Dermatologic / Skin Assessment (not related to wound area) ?ASSESSMENTS - Focused Assessment ?X- 1 5 ?Circumferential Edema Measurements - multi extremities ?'[]'$  - 0 ?Nutritional Assessment / Counseling / Intervention ?'[]'$  - 0 ?Lower Extremity Assessment (monofilament, tuning fork, pulses) ?'[]'$  - 0 ?Peripheral Arterial Disease Assessment (using hand held doppler) ?ASSESSMENTS - Ostomy and/or Continence Assessment and Care ?'[]'$  - 0 ?Incontinence Assessment and Management ?'[]'$  - 0 ?Ostomy Care Assessment and Management (repouching, etc.) ?PROCESS - Coordination of Care ?X - Simple Patient / Family Education for ongoing care 1 15 ?'[]'$  - 0 ?Complex (extensive) Patient / Family Education for ongoing care ?X- 1 10 ?Staff obtains Consents, Records, T Results / Process Orders ?est ?X- 1 10 ?Staff telephones HHA, Nursing Homes / Clarify orders / etc ?'[]'$  - 0 ?Routine Transfer to another Facility (non-emergent condition) ?'[]'$  - 0 ?Routine Hospital Admission (non-emergent condition) ?'[]'$  - 0 ?New Admissions / Biomedical engineer / Ordering NPWT Apligraf, etc. ?, ?'[]'$  - 0 ?Emergency Hospital Admission (emergent condition) ?X- 1 10 ?Simple Discharge Coordination ?'[]'$  - 0 ?Complex (extensive) Discharge Coordination ?PROCESS - Special Needs ?'[]'$  - 0 ?Pediatric / Minor Patient Management ?'[]'$  - 0 ?Isolation Patient Management ?'[]'$  - 0 ?Hearing / Language / Visual special needs ?'[]'$  - 0 ?Assessment of Community assistance (transportation, D/C planning, etc.) ?'[]'$  - 0 ?Additional  assistance / Altered mentation ?'[]'$  - 0 ?Support Surface(s) Assessment (bed, cushion, seat, etc.) ?INTERVENTIONS - Wound Cleansing / Measurement ?X - Simple Wound Cleansing - one wound 1 5 ?'[]'$  -  0 ?Complex Wound Cleansing - multiple wounds ?X- 1 5 ?Wound Imaging (photographs - any number of wounds) ?'[]'$  - 0 ?Wound Tracing (instead of photographs) ?X- 1 5 ?Simple Wound Measurement - one wound ?'[]'$  - 0 ?Complex Wound Measurement - multiple wounds ?INTERVENTIONS - Wound Dressings ?X - Small Wound Dressing one or multiple wounds 1 10 ?'[]'$  - 0 ?Medium Wound Dressing one or multiple wounds ?'[]'$  - 0 ?Large Wound Dressing one or multiple wounds ?X- 1 5 ?Application of Medications - topical ?'[]'$  - 0 ?Application of Medications - injection ?INTERVENTIONS - Miscellaneous ?'[]'$  - 0 ?External ear exam ?'[]'$  - 0 ?Specimen Collection (cultures, biopsies, blood, body fluids, etc.) ?'[]'$  - 0 ?Specimen(s) / Culture(s) sent or taken to Lab for analysis ?'[]'$  - 0 ?Patient Transfer (multiple staff / Civil Service fast streamer / Similar devices) ?'[]'$  - 0 ?Simple Staple / Suture removal (25 or less) ?'[]'$  - 0 ?Complex Staple / Suture removal (26 or more) ?'[]'$  - 0 ?Hypo / Hyperglycemic Management (close monitor of Blood Glucose) ?'[]'$  - 0 ?Ankle / Brachial Index (ABI) - do not check if billed separately ?X- 1 5 ?Vital Signs ?Has the patient been seen at the hospital within the last three years: Yes ?Total Score: 105 ?Level Of Care: New/Established - Level 3 ?Electronic Signature(s) ?Signed: 09/02/2021 12:30:28 PM By: Rhae Hammock RN ?Entered By: Rhae Hammock on 09/02/2021 10:23:30 ?-------------------------------------------------------------------------------- ?Encounter Discharge Information Details ?Patient Name: Date of Service: ?Keith Dixon, Keith EL J. 09/02/2021 9:45 A M ?Medical Record Number: [id number] ?Patient Account Number: [id number] ?Date of Birth/Sex: Treating RN: ?24-Jun-1960 (61 y.o. Burnadette Pop, Lauren ?Primary Care Vuong Musa: Reubin Milan Other  Clinician: ?Referring Keyatta Tolles: ?Treating Maalik Pinn/Extender: Kalman Shan ?Mulles, Corazon ?Weeks in Treatment: 2 ?Encounter Discharge Information Items ?Discharge Condition: Stable ?Ambulatory Status: [name] ?Discharge Destination: Home ?Transportation: Private Auto ?Accompanied By: family ?Schedule Follow-up Appointment: Yes ?Clinical Summary of Care: Patient Declined ?Electronic Signature(s) ?Signed: 09/02/2021 12:30:28 PM By: Rhae Hammock RN ?Entered By: Rhae Hammock on 09/02/2021 10:25:34 ?-------------------------------------------------------------------------------- ?Lower Extremity Assessment Details ?Patient Name: Date of Service: ?Keith Dixon, Keith EL J. 09/02/2021 9:45 A M ?Medical Record Number: [id number] ?Patient Account Number: [id number] ?Date of Birth/Sex: Treating RN: ?1960/08/15 (61 y.o. Burnadette Pop, Lauren ?Primary Care Deby Adger: Reubin Milan Other Clinician: ?Referring Rayshad Riviello: ?Treating Antonio Creswell/Extender: Kalman Shan ?Mulles, Corazon ?Weeks in Treatment: 2 ?Edema Assessment ?Assessed: [Left: No] [Right: No] ?Edema: [Left: Ye] [Right: s] ?Calf ?Left: Right: ?Point of Measurement: 35 cm From Medial Instep 38.5 cm ?Ankle ?Left: Right: ?Point of Measurement: 11 cm From Medial Instep 28 cm ?Vascular Assessment ?Pulses: ?Dorsalis Pedis ?Palpable: [Left:Yes] ?Posterior Tibial ?Palpable: [Left:Yes] ?Electronic Signature(s) ?Signed: 09/02/2021 12:30:28 PM By: Rhae Hammock RN ?Entered By: Rhae Hammock on 09/02/2021 10:11:58 ?-------------------------------------------------------------------------------- ?Multi Wound Chart Details ?Patient Name: ?Date of Service: ?Keith Dixon, Keith EL J. 09/02/2021 9:45 A M ?Medical Record Number: [id number] ?Patient Account Number: [id number] ?Date of Birth/Sex: ?Treating RN: ?1961-01-02 (61 y.o. [name] ?Primary Care Jonuel Butterfield: Reubin Milan ?Other Clinician: ?Referring Tareq Dwan: ?Treating Jansen Sciuto/Extender: Kalman Shan ?Mulles,  Corazon ?Weeks in Treatment: 2 ?Vital Signs ?Height(in): 72 ?Pulse(bpm): 82 ?Weight(lbs): 234 ?Blood Pressure(mmHg): 145/92 ?Body Mass Index(BMI): 31.7 ?Temperature(??F): 98.6 ?Respiratory Rate(breaths/min): 17 ?Photos: [N

## 2021-09-02 NOTE — Progress Notes (Signed)
Keith Dixon ([id number]) ?Visit Report for 09/02/2021 ?Chief Complaint Document Details ?Patient Name: Date of Service: ?Keith Dixon, Keith Keith J. 09/02/2021 9:45 A M ?Medical Record Number: [id number] ?Patient Account Number: [id number] ?Date of Birth/Sex: Treating RN: ?03-31-1961 (61 y.o. Keith Dixon ?Primary Care Provider: Reubin Dixon Other Clinician: ?Referring Provider: ?Treating Provider/Extender: Keith Dixon ?Keith Dixon ?Weeks in Treatment: 2 ?Information Obtained from: Patient ?Chief Complaint ?08/13/2021; left heel wound ?Electronic Signature(s) ?Signed: 09/02/2021 11:45:46 AM By: Keith Shan DO ?Entered By: Keith Dixon on 09/02/2021 10:34:16 ?-------------------------------------------------------------------------------- ?HPI Details ?Patient Name: Date of Service: ?Keith Dixon, Keith Keith J. 09/02/2021 9:45 A M ?Medical Record Number: [id number] ?Patient Account Number: [id number] ?Date of Birth/Sex: Treating RN: ?[birth year] (61 y.o. Keith Dixon ?Primary Care Provider: Reubin Dixon Other Clinician: ?Referring Provider: ?Treating Provider/Extender: Keith Dixon ?Keith Dixon ?Weeks in Treatment: 2 ?History of Present Illness ?HPI Description: Admission 08/13/2021 ?Mr. Keith Dixon is a 61 year old male with a past medical history of seizure disorder, PTSD, bipolar disorder that presents to the clinic for a 23-monthhistory ?of nonhealing ulcer to the left heel. He states that he had back surgery in August 2022 and he was transferred to a rehab facility post-surgery. He states that ?he developed a heel wound during his stay. He states this started out with an eschar and has slowly improved over time. He has home health that comes out 3 ?times weekly and he reports using collagen to the area. He is using crocs to help with offloading. He currently denies signs of infection. ?4/24; patient presents for follow-up. He has been using collagen to the wound bed. He has home health that  helps change the dressings. He denies pain. He ?has no issues or complaints today. ?Electronic Signature(s) ?Signed: 09/02/2021 11:45:46 AM By: Keith ShanDO ?Entered By: Keith Dixon 09/02/2021 10:36:14 ?-------------------------------------------------------------------------------- ?Physical Exam Details ?Patient Name: Date of Service: ?[name] Keith Keith J. 09/02/2021 9:45 A M ?Medical Record Number: [id number]?Patient Account Number: [id number]?Date of Birth/Sex: Treating RN: ?[birth year]((61y.o. MMarcheta Dixon?Primary Care Provider: Other Clinician: ?Keith Dixon ?Referring Provider: ?Treating Provider/Extender: Keith Dixon?Keith Dixon ?Weeks in Treatment: 2 ?Constitutional ?respirations regular, non-labored and within target range for patient..Marland Kitchen?Psychiatric ?pleasant and cooperative. ?Notes ?Left heel: Open wound with granulation tissue and scant nonviable tissue present. No surrounding signs of infection. ?Electronic Signature(s) ?Signed: 09/02/2021 11:45:46 AM By: Keith ShanDO ?Entered By: Keith Dixon 09/02/2021 10:36:48 ?-------------------------------------------------------------------------------- ?Physician Orders Details ?Patient Name: Date of Service: ?[name] Keith Keith J. 09/02/2021 9:45 A M ?Medical Record Number: [id number]?Patient Account Number: [id number]?Date of Birth/Sex: Treating RN: ?7Feb 16, 1962((61y.o. MBurnadette Pop Keith Dixon ?Primary Care Provider: MReubin MilanOther Clinician: ?Referring Provider: ?Treating Provider/Extender: Keith Dixon?Keith Dixon ?Weeks in Treatment: 2 ?Verbal / Phone Orders: No ?Diagnosis Coding ?ICD-10 Coding ?Code Description ?LG81.856Pressure ulcer of left heel, stage 3 ?G40.909 Epilepsy, unspecified, not intractable, without status epilepticus ?F43.10 Post-traumatic stress disorder, unspecified ?Follow-up Appointments ?ppointment in 1 week. - with Dr. HHeber Dixon(Keith Dixon Room 7) ?Return A ?Bathing/ Shower/ Hygiene ?May  shower and wash wound with soap and water. - when changing dressing ?Off-Loading ?Open toe surgical shoe to: - to left foot ?Other: - Float heels ?Additional Orders / Instructions ?Follow Nutritious Diet ?Home Health ?New wound care orders this week; continue Home Health for wound care. May utilize formulary equivalent dressing for wound treatment ?orders unless otherwise specified. ?Other Home Health Orders/Instructions: [facility] care 2-3x  week. ?Wound Treatment ?Wound #1 - Calcaneus Wound Laterality: Left ?Cleanser: Soap and Water (Home Health) 3 x Per Week/30 Days ?Discharge Instructions: May shower and wash wound with dial antibacterial soap and water prior to dressing change. ?Cleanser: Wound Cleanser (Home Health) 3 x Per Week/30 Days ?Discharge Instructions: Cleanse the wound with wound cleanser prior to applying a clean dressing using gauze sponges, not tissue or cotton balls. ?Prim Dressing: Promogran Prisma Matrix, 4.34 (sq in) (silver collagen) (Home Health) 3 x Per Week/30 Days ?ary ?Discharge Instructions: Moisten collagen with saline or hydrogel ?Secondary Dressing: ALLEVYN Heel 4 1/2in x 5 1/2in / 10.5cm x 13.5cm (Home Health) 3 x Per Week/30 Days ?Discharge Instructions: Apply over primary dressing as directed. ?Secondary Dressing: Woven Gauze Sponge, Non-Sterile 4x4 in (Home Health) 3 x Per Week/30 Days ?Discharge Instructions: Apply over primary dressing as directed. ?Secured With: [employer], 4.5x3.1 (in/yd) (Home Health) 3 x Per Week/30 Days ?Discharge Instructions: Secure with Kerlix as directed. ?Secured With: 69M Medipore H Soft Cloth Surgical T ape, 4 x 10 (in/yd) (Home Health) 3 x Per Week/30 Days ?Discharge Instructions: Secure with tape as directed. ?Consults ?Vascular ?Electronic Signature(s) ?Signed: 09/02/2021 11:45:46 AM By: Keith Shan DO ?Entered By: Keith Dixon on 09/02/2021  10:37:03 ?-------------------------------------------------------------------------------- ?Problem List Details ?Patient Name: ?Date of Service: ?Keith Dixon, Keith Keith J. 09/02/2021 9:45 A M ?Medical Record Number: [id number] ?Patient Account Number: [id number] ?Date of Birth/Sex: ?Treating RN: ?1960/08/08 (60 y.o. Keith Dixon ?Primary Care Provider: Reubin Dixon ?Other Clinician: ?Referring Provider: ?Treating Provider/Extender: Keith Dixon ?Keith Dixon ?Weeks in Treatment: 2 ?Active Problems ?ICD-10 ?Encounter ?Code Description Active Date MDM ?Diagnosis ?H47.425 Pressure ulcer of left heel, stage 3 08/13/2021 No Yes ?G40.909 Epilepsy, unspecified, not intractable, without status epilepticus 08/13/2021 No Yes ?F43.10 Post-traumatic stress disorder, unspecified 08/13/2021 No Yes ?Inactive Problems ?Resolved Problems ?Electronic Signature(s) ?Signed: 09/02/2021 11:45:46 AM By: Keith Shan DO ?Entered By: Keith Dixon on 09/02/2021 10:33:59 ?-------------------------------------------------------------------------------- ?Progress Note Details ?Patient Name: Date of Service: ?Keith Dixon, Keith Keith J. 09/02/2021 9:45 A M ?Medical Record Number: [id number] ?Patient Account Number: [id number] ?Date of Birth/Sex: Treating RN: ?Oct 27, 1960 (61 y.o. Keith Dixon ?Primary Care Provider: Reubin Dixon Other Clinician: ?Referring Provider: ?Treating Provider/Extender: Keith Dixon ?Keith Dixon ?Weeks in Treatment: 2 ?Subjective ?Chief Complaint ?Information obtained from Patient ?08/13/2021; left heel wound ?History of Present Illness (HPI) ?Admission 08/13/2021 ?Mr. Lavere Stork is a 61 year old male with a past medical history of seizure disorder, PTSD, bipolar disorder that presents to the clinic for a 2-monthhistory ?of nonhealing ulcer to the left heel. He states that he had back surgery in August 2022 and he was transferred to a rehab facility post-surgery. He states that ?he developed a heel wound  during his stay. He states this started out with an eschar and has slowly improved over time. He has home health that comes out 3 ?times weekly and he reports using collagen to the area. He is using crocs to help with offloading. He currently denies signs of infection.

## 2021-09-09 ENCOUNTER — Encounter (HOSPITAL_BASED_OUTPATIENT_CLINIC_OR_DEPARTMENT_OTHER): Payer: Medicare Other | Attending: Internal Medicine | Admitting: Internal Medicine

## 2021-09-09 DIAGNOSIS — Z87891 Personal history of nicotine dependence: Secondary | ICD-10-CM | POA: Insufficient documentation

## 2021-09-09 DIAGNOSIS — M1990 Unspecified osteoarthritis, unspecified site: Secondary | ICD-10-CM | POA: Insufficient documentation

## 2021-09-09 DIAGNOSIS — F4310 Post-traumatic stress disorder, unspecified: Secondary | ICD-10-CM | POA: Insufficient documentation

## 2021-09-09 DIAGNOSIS — G40909 Epilepsy, unspecified, not intractable, without status epilepticus: Secondary | ICD-10-CM | POA: Diagnosis not present

## 2021-09-09 DIAGNOSIS — L89623 Pressure ulcer of left heel, stage 3: Secondary | ICD-10-CM | POA: Insufficient documentation

## 2021-09-09 DIAGNOSIS — I10 Essential (primary) hypertension: Secondary | ICD-10-CM | POA: Insufficient documentation

## 2021-09-17 NOTE — Progress Notes (Signed)
PRATHER, FAILLA ([id number]) ?Visit Report for 09/09/2021 ?Chief Complaint Document Details ?Patient Name: Date of Service: ?Keith Dixon, Keith EL J. 09/09/2021 11:30 A M ?Medical Record Number: [id number] ?Patient Account Number: [id number] ?Date of Birth/Sex: Treating RN: ?1960-07-23 (61 y.o. Keith Dixon, Keith Dixon ?Primary Care Provider: Reubin Dixon Other Clinician: ?Referring Provider: ?Treating Provider/Extender: Keith Dixon ?Keith Dixon ?Weeks in Treatment: 3 ?Information Obtained from: Patient ?Chief Complaint ?08/13/2021; left heel wound ?Electronic Signature(s) ?Signed: 09/09/2021 12:31:59 PM By: Keith Shan DO ?Entered By: Keith Dixon on 09/09/2021 12:14:45 ?-------------------------------------------------------------------------------- ?HPI Details ?Patient Name: Date of Service: ?Keith Dixon, Keith EL J. 09/09/2021 11:30 A M ?Medical Record Number: [id number] ?Patient Account Number: [id number] ?Date of Birth/Sex: Treating RN: ?1960/11/20 (61 y.o. Keith Dixon, Keith Dixon ?Primary Care Provider: Reubin Dixon Other Clinician: ?Referring Provider: ?Treating Provider/Extender: Keith Dixon ?Keith Dixon ?Weeks in Treatment: 3 ?History of Present Illness ?HPI Description: Admission 08/13/2021 ?Mr. Keith Dixon is a 61 year old male with a past medical history of seizure disorder, PTSD, bipolar disorder that presents to the clinic for a 64-monthhistory ?of nonhealing ulcer to the left heel. He states that he had back surgery in August 2022 and he was transferred to a rehab facility post-surgery. He states that ?he developed a heel wound during his stay. He states this started out with an eschar and has slowly improved over time. He has home health that comes out 3 ?times weekly and he reports using collagen to the area. He is using crocs to help with offloading. He currently denies signs of infection. ?4/24; patient presents for follow-up. He has been using collagen to the wound bed. He has home health  that helps change the dressings. He denies pain. He ?has no issues or complaints today. ?5/1; Patient presents for follow-up. He has been using collagen to the wound bed with out issues. He denies signs of infection. He has not heard back from ?vein and vascular for an appointment. ?Electronic Signature(s) ?Signed: 09/09/2021 12:31:59 PM By: Keith ShanDO ?Entered By: Keith Dixon 09/09/2021 12:22:35 ?-------------------------------------------------------------------------------- ?Physical Exam Details ?Patient Name: Date of Service: ?[name] Keith EL J. 09/09/2021 11:30 A M ?Medical Record Number: [id number]?Patient Account Number: [id number]?Date of Birth/Sex: Treating RN: ?[birth year]((61y.o. MBurnadette Dixon Keith Dixon ?Primary Care Provider: MReubin MilanOther Clinician: ?Referring Provider: ?Treating Provider/Extender: Keith Dixon?Keith Dixon ?Weeks in Treatment: 3 ?Constitutional ?respirations regular, non-labored and within target range for patient..Marland Kitchen?Cardiovascular ?2+ dorsalis pedis/posterior tibialis pulses. ?Psychiatric ?pleasant and cooperative. ?Notes ?Left heel: Open wound with granulation tissue and scant nonviable tissue present. No surrounding signs of infection. ?Electronic Signature(s) ?Signed: 09/09/2021 12:31:59 PM By: Keith ShanDO ?Entered By: Keith Dixon 09/09/2021 12:23:00 ?-------------------------------------------------------------------------------- ?Physician Orders Details ?Patient Name: Date of Service: ?[name] Keith EL J. 09/09/2021 11:30 A M ?Medical Record Number: [id number]?Patient Account Number: [id number]?Date of Birth/Sex: Treating RN: ?[birth year]((61y.o. MMare Dixon?Primary Care Provider: MReubin MilanOther Clinician: ?Referring Provider: ?Treating Provider/Extender: Keith Dixon?Keith Dixon ?Weeks in Treatment: 3 ?Verbal / Phone Orders: No ?Diagnosis Coding ?Follow-up Appointments ?ppointment in 1 week. - with Dr. HHeber Dixon(Keith Dixon  Room 7) ?Return A ?Bathing/ Shower/ Hygiene ?May shower and wash wound with soap and water. - when changing dressing ?Off-Loading ?Open toe surgical shoe to: - to left foot ?Other: - Float heels ?Additional Orders / Instructions ?Follow Nutritious Diet ?Home Health ?New wound care orders this week; continue Home Health for wound care. May utilize formulary equivalent dressing for wound treatment ?orders unless  otherwise specified. ?Other Home Health Orders/Instructions: [facility]: skilled nursing for wound care 2-3x week. ?Wound Treatment ?Wound #1 - Calcaneus Wound Laterality: Left ?Cleanser: Soap and Water (Home Health) 3 x Per Week/30 Days ?Discharge Instructions: May shower and wash wound with dial antibacterial soap and water prior to dressing change. ?Cleanser: Wound Cleanser (Home Health) 3 x Per Week/30 Days ?Discharge Instructions: Cleanse the wound with wound cleanser prior to applying a clean dressing using gauze sponges, not tissue or cotton balls. ?Prim Dressing: Promogran Prisma Matrix, 4.34 (sq in) (silver collagen) (Home Health) 3 x Per Week/30 Days ?ary ?Discharge Instructions: Moisten collagen with saline or hydrogel ?Secondary Dressing: ALLEVYN Heel 4 1/2in x 5 1/2in / 10.5cm x 13.5cm (Home Health) 3 x Per Week/30 Days ?Discharge Instructions: Apply over primary dressing as directed. ?Secondary Dressing: Woven Gauze Sponge, Non-Sterile 4x4 in (Home Health) 3 x Per Week/30 Days ?Discharge Instructions: Apply over primary dressing as directed. ?Secured With: [employer], 4.5x3.1 (in/yd) (Home Health) 3 x Per Week/30 Days ?Discharge Instructions: Secure with Kerlix as directed. ?Secured With: 68M Medipore H Soft Cloth Surgical T ape, 4 x 10 (in/yd) (Home Health) 3 x Per Week/30 Days ?Discharge Instructions: Secure with tape as directed. ?Electronic Signature(s) ?Signed: 09/09/2021 12:31:59 PM By: Keith Shan DO ?Entered By: Keith Dixon on 09/09/2021  12:23:07 ?-------------------------------------------------------------------------------- ?Problem List Details ?Patient Name: ?Date of Service: ?Keith Dixon, Keith EL J. 09/09/2021 11:30 A M ?Medical Record Number: [id number] ?Patient Account Number: [id number] ?Date of Birth/Sex: ?Treating RN: ?1960/09/20 (61 y.o. Keith Dixon, Keith Dixon ?Primary Care Provider: Reubin Dixon ?Other Clinician: ?Referring Provider: ?Treating Provider/Extender: Keith Dixon ?Keith Dixon ?Weeks in Treatment: 3 ?Active Problems ?ICD-10 ?Encounter ?Code Description Active Date MDM ?Diagnosis ?J62.831 Pressure ulcer of left heel, stage 3 08/13/2021 No Yes ?G40.909 Epilepsy, unspecified, not intractable, without status epilepticus 08/13/2021 No Yes ?F43.10 Post-traumatic stress disorder, unspecified 08/13/2021 No Yes ?Inactive Problems ?Resolved Problems ?Electronic Signature(s) ?Signed: 09/09/2021 12:31:59 PM By: Keith Shan DO ?Entered By: Keith Dixon on 09/09/2021 12:12:43 ?-------------------------------------------------------------------------------- ?Progress Note Details ?Patient Name: ?Date of Service: ?Keith Dixon, Keith EL J. 09/09/2021 11:30 A M ?Medical Record Number: [id number] ?Patient Account Number: [id number] ?Date of Birth/Sex: ?Treating RN: ?Aug 14, 1960 (61 y.o. Keith Dixon, Keith Dixon ?Primary Care Provider: Reubin Dixon ?Other Clinician: ?Referring Provider: ?Treating Provider/Extender: Keith Dixon ?Keith Dixon ?Weeks in Treatment: 3 ?Subjective ?Chief Complaint ?Information obtained from Patient ?08/13/2021; left heel wound ?History of Present Illness (HPI) ?Admission 08/13/2021 ?Mr. Keith Dixon is a 61 year old male with a past medical history of seizure disorder, PTSD, bipolar disorder that presents to the clinic for a 74-monthhistory ?of nonhealing ulcer to the left heel. He states that he had back surgery in August 2022 and he was transferred to a rehab facility post-surgery. He states that ?he developed a heel  wound during his stay. He states this started out with an eschar and has slowly improved over time. He has home health that comes out 3 ?times weekly and he reports using collagen to the area. He is using crocs to help with offloading. He

## 2021-09-17 NOTE — Progress Notes (Signed)
Keith Dixon ([id number]) ?Visit Report for 09/09/2021 ?Arrival Information Details ?Patient Name: Date of Service: ?Keith Dixon, MICHA EL J. 09/09/2021 11:30 A M ?Medical Record Number: [id number] ?Patient Account Number: [id number] ?Date of Birth/Sex: Treating RN: ?December 06, 1960 (61 y.o. Mare Ferrari ?Primary Care Kenton Fortin: Reubin Milan Other Clinician: ?Referring Vontae Court: ?Treating David Towson/Extender: Kalman Shan ?Mulles, Corazon ?Weeks in Treatment: 3 ?Visit Information History Since Last Visit ?Added or deleted any medications: No ?Patient Arrived: [name] ?Any new allergies or adverse reactions: No ?Arrival Time: 11:50 ?Had a fall or experienced change in No ?Transfer Assistance: None ?activities of daily living that may affect ?Patient Identification Verified: Yes ?risk of falls: ?Secondary Verification Process Completed: Yes ?Signs or symptoms of abuse/neglect since last visito No ?Patient Requires Transmission-Based Precautions: No ?Hospitalized since last visit: No ?Patient Has Alerts: Yes ?Implantable device outside of the clinic excluding No ?Patient Alerts: Patient on Blood Thinner ?cellular tissue based products placed in the center ?since last visit: ?Has Dressing in Place as Prescribed: Yes ?Has Compression in Place as Prescribed: Yes ?Pain Present Now: No ?Electronic Signature(s) ?Signed: 09/17/2021 8:25:23 AM By: Sharyn Creamer RN, BSN ?Entered By: Sharyn Creamer on 09/09/2021 11:51:56 ?-------------------------------------------------------------------------------- ?Clinic Level of Care Assessment Details ?Patient Name: Date of Service: ?Keith Dixon, MICHA EL J. 09/09/2021 11:30 A M ?Medical Record Number: [id number] ?Patient Account Number: [id number] ?Date of Birth/Sex: Treating RN: ?08-31-1960 (61 y.o. Mare Ferrari ?Primary Care Uthman Mroczkowski: Reubin Milan Other Clinician: ?Referring Rushi Chasen: ?Treating Regine Christian/Extender: Kalman Shan ?Mulles, Corazon ?Weeks in Treatment: 3 ?Clinic Level of Care  Assessment Items ?TOOL 4 Quantity Score ?X- 1 0 ?Use when only an EandM is performed on FOLLOW-UP visit ?ASSESSMENTS - Nursing Assessment / Reassessment ?X- 1 10 ?Reassessment of Co-morbidities (includes updates in patient status) ?X- 1 5 ?Reassessment of Adherence to Treatment Plan ?ASSESSMENTS - Wound and Skin A ssessment / Reassessment ?X - Simple Wound Assessment / Reassessment - one wound 1 5 ?'[]'$  - 0 ?Complex Wound Assessment / Reassessment - multiple wounds ?'[]'$  - 0 ?Dermatologic / Skin Assessment (not related to wound area) ?ASSESSMENTS - Focused Assessment ?'[]'$  - 0 ?Circumferential Edema Measurements - multi extremities ?'[]'$  - 0 ?Nutritional Assessment / Counseling / Intervention ?'[]'$  - 0 ?Lower Extremity Assessment (monofilament, tuning fork, pulses) ?'[]'$  - 0 ?Peripheral Arterial Disease Assessment (using hand held doppler) ?ASSESSMENTS - Ostomy and/or Continence Assessment and Care ?'[]'$  - 0 ?Incontinence Assessment and Management ?'[]'$  - 0 ?Ostomy Care Assessment and Management (repouching, etc.) ?PROCESS - Coordination of Care ?X - Simple Patient / Family Education for ongoing care 1 15 ?'[]'$  - 0 ?Complex (extensive) Patient / Family Education for ongoing care ?X- 1 10 ?Staff obtains Consents, Records, T Results / Process Orders ?est ?X- 1 10 ?Staff telephones HHA, Nursing Homes / Clarify orders / etc ?'[]'$  - 0 ?Routine Transfer to another Facility (non-emergent condition) ?'[]'$  - 0 ?Routine Hospital Admission (non-emergent condition) ?'[]'$  - 0 ?New Admissions / Biomedical engineer / Ordering NPWT Apligraf, etc. ?, ?'[]'$  - 0 ?Emergency Hospital Admission (emergent condition) ?'[]'$  - 0 ?Simple Discharge Coordination ?'[]'$  - 0 ?Complex (extensive) Discharge Coordination ?PROCESS - Special Needs ?'[]'$  - 0 ?Pediatric / Minor Patient Management ?'[]'$  - 0 ?Isolation Patient Management ?'[]'$  - 0 ?Hearing / Language / Visual special needs ?'[]'$  - 0 ?Assessment of Community assistance (transportation, D/C planning, etc.) ?'[]'$  -  0 ?Additional assistance / Altered mentation ?'[]'$  - 0 ?Support Surface(s) Assessment (bed, cushion, seat, etc.) ?INTERVENTIONS - Wound Cleansing / Measurement ?X - Simple Wound Cleansing -  one wound 1 5 ?'[]'$  - 0 ?Complex Wound Cleansing - multiple wounds ?X- 1 5 ?Wound Imaging (photographs - any number of wounds) ?'[]'$  - 0 ?Wound Tracing (instead of photographs) ?X- 1 5 ?Simple Wound Measurement - one wound ?'[]'$  - 0 ?Complex Wound Measurement - multiple wounds ?INTERVENTIONS - Wound Dressings ?X - Small Wound Dressing one or multiple wounds 1 10 ?'[]'$  - 0 ?Medium Wound Dressing one or multiple wounds ?'[]'$  - 0 ?Large Wound Dressing one or multiple wounds ?'[]'$  - 0 ?Application of Medications - topical ?'[]'$  - 0 ?Application of Medications - injection ?INTERVENTIONS - Miscellaneous ?'[]'$  - 0 ?External ear exam ?'[]'$  - 0 ?Specimen Collection (cultures, biopsies, blood, body fluids, etc.) ?'[]'$  - 0 ?Specimen(s) / Culture(s) sent or taken to Lab for analysis ?'[]'$  - 0 ?Patient Transfer (multiple staff / Civil Service fast streamer / Similar devices) ?'[]'$  - 0 ?Simple Staple / Suture removal (25 or less) ?'[]'$  - 0 ?Complex Staple / Suture removal (26 or more) ?'[]'$  - 0 ?Hypo / Hyperglycemic Management (close monitor of Blood Glucose) ?'[]'$  - 0 ?Ankle / Brachial Index (ABI) - do not check if billed separately ?X- 1 5 ?Vital Signs ?Has the patient been seen at the hospital within the last three years: Yes ?Total Score: 85 ?Level Of Care: New/Established - Level 3 ?Electronic Signature(s) ?Signed: 09/17/2021 8:25:23 AM By: Sharyn Creamer RN, BSN ?Entered By: Sharyn Creamer on 09/09/2021 12:57:12 ?-------------------------------------------------------------------------------- ?Encounter Discharge Information Details ?Patient Name: Date of Service: ?Keith Dixon, MICHA EL J. 09/09/2021 11:30 A M ?Medical Record Number: [id number] ?Patient Account Number: [id number] ?Date of Birth/Sex: Treating RN: ?02/17/1961 (61 y.o. Mare Ferrari ?Primary Care Zyair Rhein: Reubin Milan Other  Clinician: ?Referring Tony Granquist: ?Treating Montoya Watkin/Extender: Kalman Shan ?Mulles, Corazon ?Weeks in Treatment: 3 ?Encounter Discharge Information Items ?Discharge Condition: Stable ?Ambulatory Status: [name] ?Discharge Destination: Home ?Transportation: Private Auto ?Accompanied By: family member ?Schedule Follow-up Appointment: Yes ?Clinical Summary of Care: Patient Declined ?Electronic Signature(s) ?Signed: 09/17/2021 8:25:23 AM By: Sharyn Creamer RN, BSN ?Entered By: Sharyn Creamer on 09/09/2021 12:58:35 ?-------------------------------------------------------------------------------- ?Lower Extremity Assessment Details ?Patient Name: Date of Service: ?Keith Dixon, MICHA EL J. 09/09/2021 11:30 A M ?Medical Record Number: [id number] ?Patient Account Number: [id number] ?Date of Birth/Sex: Treating RN: ?[birth year] (61 y.o. Mare Ferrari ?Primary Care Ramonica Grigg: Reubin Milan Other Clinician: ?Referring Darris Carachure: ?Treating Luka Stohr/Extender: Kalman Shan ?Mulles, Corazon ?Weeks in Treatment: 3 ?Edema Assessment ?Assessed: [Left: No] [Right: No] ?Edema: [Left: Ye] [Right: s] ?Calf ?Left: Right: ?Point of Measurement: 35 cm From Medial Instep 40.3 cm ?Ankle ?Left: Right: ?Point of Measurement: 11 cm From Medial Instep 25.4 cm ?Vascular Assessment ?Pulses: ?Dorsalis Pedis ?Palpable: [Left:Yes] ?Electronic Signature(s) ?Signed: 09/17/2021 8:25:23 AM By: Sharyn Creamer RN, BSN ?Entered By: Sharyn Creamer on 09/09/2021 11:54:30 ?-------------------------------------------------------------------------------- ?Multi Wound Chart Details ?Patient Name: ?Date of Service: ?Keith Dixon, MICHA EL J. 09/09/2021 11:30 A M ?Medical Record Number: [id number] ?Patient Account Number: [id number] ?Date of Birth/Sex: ?Treating RN: ?[birth year] (61 y.o. Burnadette Pop, Lauren ?Primary Care Tatianna Ibbotson: Reubin Milan ?Other Clinician: ?Referring Anaissa Macfadden: ?Treating Kern Gingras/Extender: Kalman Shan ?Mulles, Corazon ?Weeks in Treatment: 3 ?Vital  Signs ?Height(in): 72 ?Pulse(bpm): 89 ?Weight(lbs): 234 ?Blood Pressure(mmHg): 178/103 ?Body Mass Index(BMI): 31.7 ?Temperature(??F): 98.2 ?Respiratory Rate(breaths/min): 17 ?Photos: [N/A:N/A] ?Left Calcaneus N/A N/A ?W

## 2021-09-23 ENCOUNTER — Encounter (HOSPITAL_BASED_OUTPATIENT_CLINIC_OR_DEPARTMENT_OTHER): Payer: Medicare Other | Admitting: Internal Medicine

## 2021-09-30 ENCOUNTER — Encounter (HOSPITAL_BASED_OUTPATIENT_CLINIC_OR_DEPARTMENT_OTHER): Payer: Medicare Other | Admitting: Internal Medicine

## 2021-09-30 DIAGNOSIS — G40909 Epilepsy, unspecified, not intractable, without status epilepticus: Secondary | ICD-10-CM | POA: Diagnosis not present

## 2021-09-30 DIAGNOSIS — L89623 Pressure ulcer of left heel, stage 3: Secondary | ICD-10-CM

## 2021-09-30 DIAGNOSIS — F4310 Post-traumatic stress disorder, unspecified: Secondary | ICD-10-CM | POA: Diagnosis not present

## 2021-09-30 NOTE — Progress Notes (Signed)
Keith Dixon ([id number]) Visit Report for 09/30/2021 Chief Complaint Document Details Keith Dixon Name: Date of Service: Keith Dixon 09/30/2021 11:00 A M Medical Record Number: [id number] Keith Dixon Account Number: [id number] Date of Birth/Sex: Treating RN: December 26, 1960 (61 y.o. Keith Dixon Primary Care Provider: Reubin Dixon Other Clinician: Referring Provider: Treating Provider/Extender: Keith Dixon, Keith Dixon in Treatment: 6 Information Obtained from: Keith Dixon Chief Complaint 08/13/2021; left heel wound Electronic Signature(s) Signed: 09/30/2021 11:35:14 AM By: Keith Shan DO Entered By: Keith Dixon on 09/30/2021 11:28:48 -------------------------------------------------------------------------------- HPI Details Keith Dixon Name: Date of Service: Keith Karvonen EL J. 09/30/2021 11:00 A M Medical Record Number: [id number] Keith Dixon Account Number: [id number] Date of Birth/Sex: Treating RN: 08-Apr-1961 (61 y.o. Keith Dixon Primary Care Provider: Reubin Dixon Other Clinician: Referring Provider: Treating Provider/Extender: Keith Dixon, Keith Dixon in Treatment: 6 History of Present Illness HPI Description: Admission 08/13/2021 Keith Dixon is a 61 year old male with a past medical history of seizure disorder, PTSD, bipolar disorder that presents to the clinic for a 13-monthhistory of nonhealing ulcer to the left heel. He states that he had back surgery in August 2022 and he was transferred to a rehab facility post-surgery. He states that he developed a heel wound during his stay. He states this started out with an eschar and has slowly improved over time. He has home health that comes out 3 times weekly and he reports using collagen to the area. He is using crocs to help with offloading. He currently denies signs of infection. 4/24; Keith Dixon presents for follow-up. He has been using collagen to the wound bed. He has home health that  helps change the dressings. He denies pain. He has no issues or complaints today. 5/1; Keith Dixon presents for follow-up. He has been using collagen to the wound bed with out issues. He denies signs of infection. He has not heard back from vein and vascular for an appointment. 5/22; Keith Dixon presents for follow-up. He continues to use collagen to the wound bed. He has home health that comes out 3 times weekly to help with dressing changes. He has a vein and vascular appointment on 6/2. He has no issues or complaints today. Electronic Signature(s) Signed: 09/30/2021 11:35:14 AM By: Keith Dixon Entered By: Keith Shanon 09/30/2021 11:29:44 -------------------------------------------------------------------------------- Physical Exam Details Keith Dixon Name: Date of Service: [name] 09/30/2021 11:00 A M Medical Record Number: [id number] Account Number: [id number] of Birth/Sex: Treating RN: 7December 19, 1962(61y.o. MMarcheta GrammesPrimary Care Provider: MReubin MilanOther Clinician: Referring Provider: Treating Provider/Extender: HToney Dixon Keith Dixon in Treatment: 6 Constitutional respirations regular, non-labored and within target range for Keith Dixon..Marland KitchenPsychiatric pleasant and cooperative. Notes Left heel: Open wound with granulation tissue and scant nonviable tissue present. Surface does not appear particularly healthy. No surrounding signs of infection. Electronic Signature(s) Signed: 09/30/2021 11:35:14 AM By: Keith Dixon Entered By: Keith Shanon 09/30/2021 11:32:54 -------------------------------------------------------------------------------- Physician Orders Details Keith Dixon Name: Date of Service: [name] 09/30/2021 11:00 A M Medical Record Number: [id number] Account Number: [id number] of Birth/Sex: Treating RN: [birth year](61y.o. MMarcheta GrammesPrimary Care Provider: MReubin MilanOther  Clinician: Referring Provider: Treating Provider/Extender: HToney Dixon Keith Dixon in Treatment: 6 Verbal / Phone Orders: No Diagnosis Coding ICD-10 Coding Code Description [id number] ulcer of left heel, stage 3 G40.909 Epilepsy, unspecified, not intractable, without status epilepticus F43.10 Post-traumatic stress disorder, unspecified Follow-up Appointments ppointment in 2 Dixon. -  Monday 10/14/21 @ 10:15am with Dr. Heber Dixon Keith Dixon, Room 7) Return A Bathing/ Shower/ Hygiene May shower and wash wound with soap and water. - when changing dressing Off-Loading Open toe surgical shoe to: - to left foot Other: - Float heels Additional Orders / Instructions Follow Power wound care orders this week; continue Home Health for wound care. May utilize formulary equivalent dressing for wound treatment orders unless otherwise specified. - Primary dressing changed to Silver Alginate Other Home Health Orders/Instructions: - [facility]: skilled nursing for wound care 2-3x week. Wound Treatment Wound #1 - Calcaneus Wound Laterality: Left Cleanser: Soap and Water (Home Health) 3 x Per Week/30 Days Discharge Instructions: May shower and wash wound with dial antibacterial soap and water prior to dressing change. Cleanser: Wound Cleanser (Home Health) 3 x Per Week/30 Days Discharge Instructions: Cleanse the wound with wound cleanser prior to applying a clean dressing using gauze sponges, not tissue or cotton balls. Prim Dressing: KerraCel Ag Gelling Fiber Dressing, 2x2 in (silver alginate) (Home Health) 3 x Per Week/30 Days ary Discharge Instructions: Apply silver alginate to wound bed as instructed Secondary Dressing: ALLEVYN Heel 4 1/2in x 5 1/2in / 10.5cm x 13.5cm (Home Health) 3 x Per Week/30 Days Discharge Instructions: Apply over primary dressing as directed. Secondary Dressing: Woven Gauze Sponge, Non-Sterile 4x4 in (Home Health) 3 x Per Week/30  Days Discharge Instructions: Apply over primary dressing as directed. Secured With: [employer], 4.5x3.1 (in/yd) (Home Health) 3 x Per Week/30 Days Discharge Instructions: Secure with Kerlix as directed. Secured With: 65M Medipore H Soft Cloth Surgical T ape, 4 x 10 (in/yd) (Home Health) 3 x Per Week/30 Days Discharge Instructions: Secure with tape as directed. Laboratory erobe culture (MICRO) - PCR Culture sent to [facility] - (ICD10 L93.570 - Pressure ulcer of left Bacteria identified in Unspecified specimen by A heel, stage 3) LOINC Code: 177-9 Convenience Name: Aerobic culture-specimen not specified Electronic Signature(s) Signed: 09/30/2021 11:35:14 AM By: Keith Shan DO Entered By: Keith Dixon on 09/30/2021 11:33:03 -------------------------------------------------------------------------------- Problem List Details Keith Dixon Name: Date of Service: Keith Karvonen EL J. 09/30/2021 11:00 A M Medical Record Number: [id number] Keith Dixon Account Number: [id number] Date of Birth/Sex: Treating RN: 01-10-1961 (61 y.o. Keith Dixon Primary Care Provider: Reubin Dixon Other Clinician: Referring Provider: Treating Provider/Extender: Keith Dixon, Keith Dixon in Treatment: 6 Active Problems ICD-10 Encounter Code Description Active Date MDM Diagnosis [phone] Pressure ulcer of left heel, stage 3 08/13/2021 No Yes G40.909 Epilepsy, unspecified, not intractable, without status epilepticus 08/13/2021 No Yes F43.10 Post-traumatic stress disorder, unspecified 08/13/2021 No Yes Inactive Problems Resolved Problems Electronic Signature(s) Signed: 09/30/2021 11:35:14 AM By: Keith Shan DO Previous Signature: 09/30/2021 11:09:16 AM Version By: Lorrin Jackson Entered By: Keith Dixon on 09/30/2021 11:28:37 -------------------------------------------------------------------------------- Progress Note Details Keith Dixon Name: Date of Service: Keith Karvonen EL J.  09/30/2021 11:00 A M Medical Record Number: [id number] Keith Dixon Account Number: [id number] Date of Birth/Sex: Treating RN: 10/26/1960 (61 y.o. Keith Dixon Primary Care Provider: Reubin Dixon Other Clinician: Referring Provider: Treating Provider/Extender: Keith Dixon, Keith Dixon in Treatment: 6 Subjective Chief Complaint Information obtained from Keith Dixon 08/13/2021; left heel wound History of Present Illness (HPI) Admission 08/13/2021 Keith Dixon is a 61 year old male with a past medical history of seizure disorder, PTSD, bipolar disorder that presents to the clinic for a 83-monthhistory of nonhealing ulcer to the left heel. He states that he had back surgery in August 2022 and he was transferred to a  rehab facility post-surgery. He states that he developed a heel wound during his stay. He states this started out with an eschar and has slowly improved over time. He has home health that comes out 3 times weekly and he reports using collagen to the area. He is using crocs to help with offloading. He currently denies signs of infection. 4/24; Keith Dixon presents for follow-up. He has been using collagen to the wound bed. He has home health that helps change the dressings. He denies pain. He has no issues or complaints today. 5/1; Keith Dixon presents for follow-up. He has been using collagen to the wound bed with out issues. He denies signs of infection. He has not heard back from vein and vascular for an appointment. 5/22; Keith Dixon presents for follow-up. He continues to use collagen to the wound bed. He has home health that comes out 3 times weekly to help with dressing changes. He has a vein and vascular appointment on 6/2. He has no issues or complaints today. Keith Dixon History Information obtained from Keith Dixon, Chart. Family History Cancer - Mother, Seizures - Mother, No family history of Diabetes, Heart Disease, Hereditary Spherocytosis, Hypertension, Kidney Disease, Lung  Disease, Stroke, Thyroid Problems, Tuberculosis. Social History Former smoker - Quit Aug 2022, Marital Status - Single, Alcohol Use - Rarely, Drug Use - Prior History, Caffeine Use - Moderate. 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 Vitals Time Taken: 11:07 AM, Height: 72 in, Weight: 234 lbs, BMI: 31.7, Temperature: 98 F, Pulse: 90 bpm, Respiratory Rate: 16 breaths/min, Blood Pressure: 185/98 mmHg. Psychiatric pleasant and cooperative. General Notes: Left heel: Open wound with granulation tissue and scant nonviable tissue present. Surface does not appear particularly healthy. No surrounding signs of infection. Integumentary (Hair, Skin) Wound #1 status is Open. Original cause of wound was Pressure Injury. The date acquired was: 04/15/2021. The wound has been in treatment 6 Dixon. The wound is located on the Left Calcaneus. The wound measures 1cm length x 0.7cm width x 0.3cm depth; 0.55cm^2 area and 0.165cm^3 volume. There is Fat Layer (Subcutaneous Tissue) exposed. There is no tunneling or undermining noted. There is a medium amount of serosanguineous drainage noted. The wound margin is well defined and not attached to the wound base. There is medium (34-66%) red, pink, friable granulation within the wound bed. There is a medium (34- 66%) amount of necrotic tissue within the wound bed including Adherent Slough. Assessment Active Problems ICD-10 Pressure ulcer of left heel,  stage 3 Epilepsy, unspecified, not intractable, without status epilepticus Post-traumatic stress disorder, unspecified Keith Dixon's wound is stable. Slightly more serous drainage noted on exam. I recommended switching the dressing to silver alginate from collagen. He is scheduled to see vein and vascular next week to assess blood flow status for his wound healing. Will hold off on debridement for now. I think he would also benefit from [facility] antibiotic as the wound surface does not appear particularly healthy. I obtained a PCR culture today. He has home health that will come out 3 times weekly. Follow-up in 2 Dixon. Plan Follow-up Appointments: Return Appointment in 2 Dixon. - Monday 10/14/21 @ 10:15am with Dr. Heber Adrian Keith Dixon,  Room 7) Bathing/ Shower/ Hygiene: May shower and wash wound with soap and water. - when changing dressing Off-Loading: Open toe surgical shoe to: - to left foot Other: - Float heels Additional Orders / Instructions: Follow Nutritious Diet Home Health: New wound care orders this week; continue Home Health for wound care. May utilize formulary equivalent dressing for wound treatment orders unless otherwise specified. - Primary dressing changed to Silver Alginate Other Home Health Orders/Instructions: - [facility]: skilled nursing for wound care 2-3x week. Laboratory ordered were: Aerobic culture-specimen not specified - PCR Culture sent to [facility] WOUND #1: - Calcaneus Wound Laterality: Left Cleanser: Soap and Water (Home Health) 3 x Per Week/30 Days Discharge Instructions: May shower and wash wound with dial antibacterial soap and water prior to dressing change. Cleanser: Wound Cleanser (Home Health) 3 x Per Week/30 Days Discharge Instructions: Cleanse the wound with wound cleanser prior to applying a clean dressing using gauze sponges, not tissue or cotton balls. Prim Dressing: KerraCel Ag Gelling Fiber Dressing, 2x2 in (silver alginate) (Home Health) 3 x Per Week/30  Days ary Discharge Instructions: Apply silver alginate to wound bed as instructed Secondary Dressing: ALLEVYN Heel 4 1/2in x 5 1/2in / 10.5cm x 13.5cm (Home Health) 3 x Per Week/30 Days Discharge Instructions: Apply over primary dressing as directed. Secondary Dressing: Woven Gauze Sponge, Non-Sterile 4x4 in (Home Health) 3 x Per Week/30 Days Discharge Instructions: Apply over primary dressing as directed. Secured With: [employer], 4.5x3.1 (in/yd) (Home Health) 3 x Per Week/30 Days Discharge Instructions: Secure with Kerlix as directed. Secured With: 35M Medipore H Soft Cloth Surgical T ape, 4 x 10 (in/yd) (Home Health) 3 x Per Week/30 Days Discharge Instructions: Secure with tape as directed. 1. Silver alginate 2. PCR culture for [facility] antibiotic 3. Follow-up in 2 Dixon Electronic Signature(s) Signed: 09/30/2021 11:35:14 AM By: Keith Shan DO Entered By: Keith Dixon on 09/30/2021 11:34:49 -------------------------------------------------------------------------------- HxROS Details Keith Dixon Name: Date of Service: Keith Karvonen EL J. 09/30/2021 11:00 A M Medical Record Number: [id number] Keith Dixon Account Number: [id number] Date of Birth/Sex: Treating RN: 06/23/1960 (61 y.o. Keith Dixon Primary Care Provider: Reubin Dixon Other Clinician: Referring Provider: Treating Provider/Extender: Keith Dixon, Keith Dixon in Treatment: 6 Information Obtained From Keith Dixon Chart Respiratory Medical History: Positive for: Sleep Apnea Cardiovascular Medical History: Positive for: Hypertension Past Medical History Notes: CVA Aug '22 Genitourinary Medical History: Past Medical History Notes: Urinary incontinence Integumentary (Skin) Medical History: Past Medical History Notes: Dermatophytosis of groin Musculoskeletal Medical History: Positive for: Osteoarthritis Past Medical History Notes: Back Surgery Aug 2022 Neurologic Medical  History: Positive for: Seizure Disorder - For 20 years Psychiatric Medical History: Past Medical History Notes: Depressive disorder PTSD Immunizations Pneumococcal Vaccine: Received Pneumococcal Vaccination: No Implantable Devices None Family and Social History Cancer: Yes - Mother; Diabetes: No; Heart Disease: No; Hereditary Spherocytosis: No; Hypertension: No; Kidney Disease: No; Lung Disease: No; Seizures: Yes - Mother; Stroke: No; Thyroid Problems: No; Tuberculosis: No; Former smoker - Quit Aug 2022; Marital Status - Single; Alcohol Use: Rarely; Drug Use: Prior History; Caffeine Use: Moderate; Financial Concerns: No; Food, Clothing or Shelter Needs: No; Support System Lacking: No; Transportation Concerns: No Electronic Signature(s) Signed: 09/30/2021 11:35:14 AM By: Keith Shan DO Signed: 09/30/2021 4:27:47 PM By: Lorrin Jackson Entered By: Keith Dixon on 09/30/2021 11:32:11 -------------------------------------------------------------------------------- SuperBill Details Keith Dixon Name: Date of Service: Keith Dixon. 09/30/2021 Medical Record Number: [id number] Keith Dixon Account Number: [id number] Date of Birth/Sex: Treating RN: 09-Dec-1960 (61 y.o. Fenton Foy,  Keith Dixon Primary Care Provider: Reubin Dixon Other Clinician: Referring Provider: Treating Provider/Extender: Keith Dixon, Keith Dixon in Treatment: 6 Diagnosis Coding ICD-10 Codes Code Description [phone] Pressure ulcer of left heel, stage 3 G40.909 Epilepsy, unspecified, not intractable, without status epilepticus F43.10 Post-traumatic stress disorder, unspecified Facility Procedures CPT4 Code: [id number] Description: 99213 - WOUND CARE VISIT-LEV 3 EST PT Modifier: Quantity: 1 Physician Procedures : CPT4 Code Description Modifier [id number] - WC PHYS LEVEL 3 - EST PT ICD-10 Diagnosis Description L89.623 Pressure ulcer of left heel, stage 3 G40.909 Epilepsy, unspecified, not  intractable, without status epilepticus F43.10 Post-traumatic stress  disorder, unspecified Quantity: 1 Electronic Signature(s) Signed: 09/30/2021 11:35:14 AM By: Keith Shan DO Entered By: Keith Dixon on 09/30/2021 11:34:59

## 2021-10-02 ENCOUNTER — Other Ambulatory Visit: Payer: Self-pay | Admitting: *Deleted

## 2021-10-02 DIAGNOSIS — M25569 Pain in unspecified knee: Secondary | ICD-10-CM

## 2021-10-10 NOTE — Progress Notes (Unsigned)
Office Note     CC:  PAD with Ulcer  Requesting Provider:  Reubin Milan, MD  HPI: Keith Dixon is a 61 y.o. ([birth year]) male presenting at the request of .Mulles, Corazon, MD for slowly healing left lower extremity heel wound.  This wound has been present since August of last year.  Originally involving the entirety of the heel, it is now much smaller, but wound healing has become stagnant.  Keith Dixon presents today accompanied by his daughter.  Agrees were negative, he graduated from [employer].  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  Currently on disability chronic back problems Keith Dixon denies symptoms of claudication, rest pain.  The pt is not on a statin for cholesterol management.  The pt is  on a daily aspirin.   Other AC:   The pt is  on medication for hypertension.   The pt is  diabetic.  Tobacco hx:  Current Smoker  Past Medical History:  Diagnosis Date   Anxiety    Arthritis    Bipolar 1 disorder, depressed ([place])    Bipolar disorder ([place])    Depression    History of posttraumatic stress disorder (PTSD)    Hypertension    PTSD (post-traumatic stress disorder)    Seizures ([place])    Sleep apnea    Smoker    Stroke ([place])    Tobacco use disorder     Past Surgical History:  Procedure Laterality Date   ANTERIOR LATERAL LUMBAR FUSION 4 LEVELS N/A 12/12/2020   Procedure: Anterior Lateral Interbody Fusion Lumbar One-Two, Lumbar Two-Three, Lumbar Three-Four, Lumbar Four-Five;  Surgeon: Franchot Gallo, MD;  Location: [place];  Service: Urology;  Laterality: N/A;  Anterior Lateral Interbody Fusion Lumbar One-Two, Lumbar Two-Three, Lumbar Three-Four, Lumbar Four-Five   APPLICATION OF INTRAOPERATIVE CT SCAN N/A 12/13/2020   Procedure: APPLICATION OF INTRAOPERATIVE CT SCAN;  Surgeon: Vallarie Mare, MD;  Location: [facility] OR;  Service: Neurosurgery;  Laterality: N/A;   APPLICATION OF WOUND VAC N/A 01/17/2021   Procedure: APPLICATION OF WOUND VAC;   Surgeon: Vallarie Mare, MD;  Location: [place];  Service: Neurosurgery;  Laterality: N/A;   APPLICATION OF WOUND VAC N/A 02/26/2021   Procedure: APPLICATION OF WOUND VAC;  Surgeon: Vallarie Mare, MD;  Location: [place];  Service: Neurosurgery;  Laterality: N/A;   CYSTOSCOPY  12/12/2020   Procedure: CYSTOSCOPY, URETHRAL DILATION, DIFFICULT FOLEY INSERTION;  Surgeon: Franchot Gallo, MD;  Location: [place];  Service: Urology;;   IR FLUORO GUIDE CV LINE RIGHT  12/21/2020   IR IVC FILTER PLMT / S&I /IMG GUID/MOD SED  12/16/2020   IR REMOVAL TUN CV CATH W/O FL  12/31/2020   IR US GUIDE VASC ACCESS RIGHT  12/21/2020   LUMBAR WOUND DEBRIDEMENT N/A 02/26/2021   Procedure: LUMBAR WOUND DEBRIDEMENT;  Surgeon: Vallarie Mare, MD;  Location: [place];  Service: Neurosurgery;  Laterality: N/A;   POSTERIOR LUMBAR FUSION 4 LEVEL N/A 12/13/2020   Procedure: Thoracic Ten - ILIAC FUSION, L5-S1 TLIF, Posterior osteotomies for deformity correction and decompression L2-3, L3-4, L4-5;  Surgeon: Vallarie Mare, MD;  Location: [place];  Service: Neurosurgery;  Laterality: N/A;   WOUND EXPLORATION N/A 01/17/2021   Procedure: THORACOLUMBAR WOUND EXPLORATION;  Surgeon: Vallarie Mare, MD;  Location: [place];  Service: Neurosurgery;  Laterality: N/A;    Social History   Socioeconomic History   Marital status: Significant Other    Spouse name: Not on file  Number of children: 1   Years of education: Not on file   Highest education level: Not on file  Occupational History    Employer: [employer]  Tobacco Use   Smoking status: Former    Packs/day: 0.50    Years: 20.00    Pack years: 10.00    Types: Cigarettes   Smokeless tobacco: Never  Vaping Use   Vaping Use: Never used  Substance and Sexual Activity   Alcohol use: Yes    Alcohol/week: 2.0 standard drinks    Types: 2 Shots of liquor per week   Drug use: Yes    Types: Marijuana    Comment: pt stated he quit 2 weeks ago 08/10/12, pt reports doing  marijuana on 11/13/12   Sexual activity: Yes  Other Topics Concern   Not on file  Social History Narrative   ** Merged History Encounter **       Social Determinants of Health   Financial Resource Strain: Not on file  Food Insecurity: Not on file  Transportation Needs: Not on file  Physical Activity: Not on file  Stress: Not on file  Social Connections: Not on file  Intimate Partner Violence: Not on file   Family History  Problem Relation Age of Onset   Seizures Mother    Breast cancer Mother 31   Hypertension Brother     Current Outpatient Medications  Medication Sig Dispense Refill   acetaminophen (TYLENOL) 500 MG tablet Take 2 tablets (1,000 mg total) by mouth every 6 (six) hours as needed for mild pain. 30 tablet 0   aspirin 81 MG chewable tablet Chew 1 tablet (81 mg total) by mouth daily. 60 tablet 3   Cholecalciferol (D3-1000) 25 MCG (1000 UT) tablet Take 2,000 Units by mouth 2 (two) times daily.     cyclobenzaprine (FLEXERIL) 10 MG tablet Take 1 tablet (10 mg total) by mouth 3 (three) times daily as needed for muscle spasms. (Patient taking differently: Take 10 mg by mouth every 8 (eight) hours as needed for muscle spasms.) 60 tablet 0   docusate sodium (COLACE) 100 MG capsule Take 1 capsule (100 mg total) by mouth 2 (two) times daily as needed for mild constipation. (Patient taking differently: Take 100 mg by mouth every 12 (twelve) hours as needed (for constipation).) 60 capsule 2   enoxaparin (LOVENOX) 40 MG/0.4ML injection Inject 0.4 mLs (40 mg total) into the skin daily for 28 days. 11.2 mL 0   Ensure (ENSURE) Take 237 mLs by mouth 3 (three) times daily.     gabapentin (NEURONTIN) 300 MG capsule Take 1 capsule (300 mg total) by mouth 2 (two) times daily.     hydrOXYzine (VISTARIL) 50 MG capsule Take 50 mg by mouth in the morning and at bedtime.     KEPPRA 750 MG tablet Take 1,500 mg by mouth 2 (two) times daily.     LAMICTAL 100 MG tablet Take 100 mg by mouth 2 (two)  times daily.     Magnesium 400 MG TABS Take 400 mg by mouth daily. 14 tablet 0   oxyCODONE-acetaminophen (PERCOCET) 5-325 MG tablet Take 1 tablet by mouth every 4 (four) hours as needed for severe pain. 40 tablet 0   pantoprazole (PROTONIX) 40 MG tablet Take 1 tablet (40 mg total) by mouth at bedtime. 50 tablet 2   QUEtiapine (SEROQUEL) 200 MG tablet Take 200 mg by mouth at bedtime.     sertraline (ZOLOFT) 100 MG tablet Take 100 mg by mouth  daily after breakfast.     No current facility-administered medications for this visit.    No Known Allergies   REVIEW OF SYSTEMS:  '[X]'$  denotes positive finding, '[ ]'$  denotes negative finding Cardiac  Comments:  Chest pain or chest pressure:    Shortness of breath upon exertion:    Short of breath when lying flat:    Irregular heart rhythm:        Vascular    Pain in calf, thigh, or hip brought on by ambulation:    Pain in feet at night that wakes you up from your sleep:     Blood clot in your veins:    Leg swelling:         Pulmonary    Oxygen at home:    Productive cough:     Wheezing:         Neurologic    Sudden weakness in arms or legs:     Sudden numbness in arms or legs:     Sudden onset of difficulty speaking or slurred speech:    Temporary loss of vision in one eye:     Problems with dizziness:         Gastrointestinal    Blood in stool:     Vomited blood:         Genitourinary    Burning when urinating:     Blood in urine:        Psychiatric    Major depression:         Hematologic    Bleeding problems:    Problems with blood clotting too easily:        Skin    Rashes or ulcers:        Constitutional    Fever or chills:      PHYSICAL EXAMINATION:  There were no vitals filed for this visit.  General:  WDWN in NAD; vital signs documented above Gait: Not observed HENT: WNL, normocephalic Pulmonary: normal non-labored breathing , without wheezing Cardiac: regular HR Abdomen: soft, NT, no masses Skin:  without rashes Vascular Exam/Pulses:  Right Left  Radial 2+ (normal) 2+ (normal)  Ulnar 2+ (normal) 2+ (normal)  Femoral 2+ (normal) 2+ (normal)  Popliteal    DP NP NP  PT NP NP   Extremities: with ischemic changes, without Gangrene , without cellulitis; with open wounds;  Musculoskeletal: no muscle wasting or atrophy  Neurologic: A&O X 3;  No focal weakness or paresthesias are detected Psychiatric:  The pt has Normal affect.   Non-Invasive Vascular Imaging:   +-------+-----------+-----------+------------+------------+  ABI/TBIToday's ABIToday's TBIPrevious ABIPrevious TBI  +-------+-----------+-----------+------------+------------+  Right  0.84       0.60                                 +-------+-----------+-----------+------------+------------+  Left   0.56       0.29                                 +-------+-----------+-----------+------------+------------+     ASSESSMENT/PLAN: Keith Dixon is a 61 y.o. male presenting with left lower extremity Rutherford 5 critical limb ischemia.  ABIs were reviewed demonstrating monophasic waveforms and depressed toe pressure in the left lower extremity.  On physical exam, he has nonpalpable pulses in the feet.  Per history, wound healing has become stagnant.  I had a  long discussion with [place] regarding the above.  After discussing the risk and benefits of left lower extremity angiography in an effort to define and improve distal perfusion for wound healing, Froil[place] elected to proceed.  He is unable to schedule Wednesday angiography, and therefore I will work to schedule him with one of my partners.   Broadus John, MD [facility] [phone]

## 2021-10-10 NOTE — H&P (View-Only) (Signed)
Office Note     CC:  PAD with Ulcer  Requesting Provider:  Reubin Milan, MD  HPI: Keith Dixon is a 61 y.o. (1960/12/09) male presenting at the request of .Mulles, Corazon, MD for slowly healing left lower extremity heel wound.  This wound has been present since August of last year.  Originally involving the entirety of the heel, it is now much smaller, but wound healing has become stagnant.  Keith Dixon accompanied by his daughter.  Agrees were negative, he graduated from [employer].  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  Currently on disability chronic back problems Keith Dixon denies symptoms of claudication, rest pain.  The pt is not on a statin for cholesterol management.  The pt is  on a daily aspirin.   Other AC:   The pt is  on medication for hypertension.   The pt is  diabetic.  Tobacco hx:  Current Smoker  Past Medical History:  Diagnosis Date   Anxiety    Arthritis    Bipolar 1 disorder, depressed ([place])    Bipolar disorder ([place])    Depression    History of posttraumatic stress disorder (PTSD)    Hypertension    PTSD (post-traumatic stress disorder)    Seizures ([place])    Sleep apnea    Smoker    Stroke ([place])    Tobacco use disorder     Past Surgical History:  Procedure Laterality Date   ANTERIOR LATERAL LUMBAR FUSION 4 LEVELS N/A 12/12/2020   Procedure: Anterior Lateral Interbody Fusion Lumbar One-Two, Lumbar Two-Three, Lumbar Three-Four, Lumbar Four-Five;  Surgeon: Franchot Gallo, MD;  Location: [place];  Service: Urology;  Laterality: N/A;  Anterior Lateral Interbody Fusion Lumbar One-Two, Lumbar Two-Three, Lumbar Three-Four, Lumbar Four-Five   APPLICATION OF INTRAOPERATIVE CT SCAN N/A 12/13/2020   Procedure: APPLICATION OF INTRAOPERATIVE CT SCAN;  Surgeon: Vallarie Mare, MD;  Location: [facility] OR;  Service: Neurosurgery;  Laterality: N/A;   APPLICATION OF WOUND VAC N/A 01/17/2021   Procedure: APPLICATION OF WOUND VAC;   Surgeon: Vallarie Mare, MD;  Location: [place];  Service: Neurosurgery;  Laterality: N/A;   APPLICATION OF WOUND VAC N/A 02/26/2021   Procedure: APPLICATION OF WOUND VAC;  Surgeon: Vallarie Mare, MD;  Location: [place];  Service: Neurosurgery;  Laterality: N/A;   CYSTOSCOPY  12/12/2020   Procedure: CYSTOSCOPY, URETHRAL DILATION, DIFFICULT FOLEY INSERTION;  Surgeon: Franchot Gallo, MD;  Location: [place];  Service: Urology;;   IR FLUORO GUIDE CV LINE RIGHT  12/21/2020   IR IVC FILTER PLMT / S&I /IMG GUID/MOD SED  12/16/2020   IR REMOVAL TUN CV CATH W/O FL  12/31/2020   IR US GUIDE VASC ACCESS RIGHT  12/21/2020   LUMBAR WOUND DEBRIDEMENT N/A 02/26/2021   Procedure: LUMBAR WOUND DEBRIDEMENT;  Surgeon: Vallarie Mare, MD;  Location: [place];  Service: Neurosurgery;  Laterality: N/A;   POSTERIOR LUMBAR FUSION 4 LEVEL N/A 12/13/2020   Procedure: Thoracic Ten - ILIAC FUSION, L5-S1 TLIF, Posterior osteotomies for deformity correction and decompression L2-3, L3-4, L4-5;  Surgeon: Vallarie Mare, MD;  Location: [place];  Service: Neurosurgery;  Laterality: N/A;   WOUND EXPLORATION N/A 01/17/2021   Procedure: THORACOLUMBAR WOUND EXPLORATION;  Surgeon: Vallarie Mare, MD;  Location: [place];  Service: Neurosurgery;  Laterality: N/A;    Social History   Socioeconomic History   Marital status: Significant Other    Spouse name: Not on file  Number of children: 1   Years of education: Not on file   Highest education level: Not on file  Occupational History    Employer: [employer]  Tobacco Use   Smoking status: Former    Packs/day: 0.50    Years: 20.00    Pack years: 10.00    Types: Cigarettes   Smokeless tobacco: Never  Vaping Use   Vaping Use: Never used  Substance and Sexual Activity   Alcohol use: Yes    Alcohol/week: 2.0 standard drinks    Types: 2 Shots of liquor per week   Drug use: Yes    Types: Marijuana    Comment: pt stated he quit 2 weeks ago 08/10/12, pt reports doing  marijuana on 11/13/12   Sexual activity: Yes  Other Topics Concern   Not on file  Social History Narrative   ** Merged History Encounter **       Social Determinants of Health   Financial Resource Strain: Not on file  Food Insecurity: Not on file  Transportation Needs: Not on file  Physical Activity: Not on file  Stress: Not on file  Social Connections: Not on file  Intimate Partner Violence: Not on file   Family History  Problem Relation Age of Onset   Seizures Mother    Breast cancer Mother 45   Hypertension Brother     Current Outpatient Medications  Medication Sig Dispense Refill   acetaminophen (TYLENOL) 500 MG tablet Take 2 tablets (1,000 mg total) by mouth every 6 (six) hours as needed for mild pain. 30 tablet 0   aspirin 81 MG chewable tablet Chew 1 tablet (81 mg total) by mouth daily. 60 tablet 3   Cholecalciferol (D3-1000) 25 MCG (1000 UT) tablet Take 2,000 Units by mouth 2 (two) times daily.     cyclobenzaprine (FLEXERIL) 10 MG tablet Take 1 tablet (10 mg total) by mouth 3 (three) times daily as needed for muscle spasms. (Patient taking differently: Take 10 mg by mouth every 8 (eight) hours as needed for muscle spasms.) 60 tablet 0   docusate sodium (COLACE) 100 MG capsule Take 1 capsule (100 mg total) by mouth 2 (two) times daily as needed for mild constipation. (Patient taking differently: Take 100 mg by mouth every 12 (twelve) hours as needed (for constipation).) 60 capsule 2   enoxaparin (LOVENOX) 40 MG/0.4ML injection Inject 0.4 mLs (40 mg total) into the skin daily for 28 days. 11.2 mL 0   Ensure (ENSURE) Take 237 mLs by mouth 3 (three) times daily.     gabapentin (NEURONTIN) 300 MG capsule Take 1 capsule (300 mg total) by mouth 2 (two) times daily.     hydrOXYzine (VISTARIL) 50 MG capsule Take 50 mg by mouth in the morning and at bedtime.     KEPPRA 750 MG tablet Take 1,500 mg by mouth 2 (two) times daily.     LAMICTAL 100 MG tablet Take 100 mg by mouth 2 (two)  times daily.     Magnesium 400 MG TABS Take 400 mg by mouth daily. 14 tablet 0   oxyCODONE-acetaminophen (PERCOCET) 5-325 MG tablet Take 1 tablet by mouth every 4 (four) hours as needed for severe pain. 40 tablet 0   pantoprazole (PROTONIX) 40 MG tablet Take 1 tablet (40 mg total) by mouth at bedtime. 50 tablet 2   QUEtiapine (SEROQUEL) 200 MG tablet Take 200 mg by mouth at bedtime.     sertraline (ZOLOFT) 100 MG tablet Take 100 mg by mouth  daily after breakfast.     No current facility-administered medications for this visit.    No Known Allergies   REVIEW OF SYSTEMS:  '[X]'$  denotes positive finding, '[ ]'$  denotes negative finding Cardiac  Comments:  Chest pain or chest pressure:    Shortness of breath upon exertion:    Short of breath when lying flat:    Irregular heart rhythm:        Vascular    Pain in calf, thigh, or hip brought on by ambulation:    Pain in feet at night that wakes you up from your sleep:     Blood clot in your veins:    Leg swelling:         Pulmonary    Oxygen at home:    Productive cough:     Wheezing:         Neurologic    Sudden weakness in arms or legs:     Sudden numbness in arms or legs:     Sudden onset of difficulty speaking or slurred speech:    Temporary loss of vision in one eye:     Problems with dizziness:         Gastrointestinal    Blood in stool:     Vomited blood:         Genitourinary    Burning when urinating:     Blood in urine:        Psychiatric    Major depression:         Hematologic    Bleeding problems:    Problems with blood clotting too easily:        Skin    Rashes or ulcers:        Constitutional    Fever or chills:      PHYSICAL EXAMINATION:  There were no vitals filed for this visit.  General:  WDWN in NAD; vital signs documented above Gait: Not observed HENT: WNL, normocephalic Pulmonary: normal non-labored breathing , without wheezing Cardiac: regular HR Abdomen: soft, NT, no masses Skin:  without rashes Vascular Exam/Pulses:  Right Left  Radial 2+ (normal) 2+ (normal)  Ulnar 2+ (normal) 2+ (normal)  Femoral 2+ (normal) 2+ (normal)  Popliteal    DP NP NP  PT NP NP   Extremities: with ischemic changes, without Gangrene , without cellulitis; with open wounds;  Musculoskeletal: no muscle wasting or atrophy  Neurologic: A&O X 3;  No focal weakness or paresthesias are detected Psychiatric:  The pt has Normal affect.   Non-Invasive Vascular Imaging:   +-------+-----------+-----------+------------+------------+  ABI/TBIToday's ABIToday's TBIPrevious ABIPrevious TBI  +-------+-----------+-----------+------------+------------+  Right  0.84       0.60                                 +-------+-----------+-----------+------------+------------+  Left   0.56       0.29                                 +-------+-----------+-----------+------------+------------+     ASSESSMENT/PLAN: AMEN STASZAK is a 61 y.o. male presenting with left lower extremity Rutherford 5 critical limb ischemia.  ABIs were reviewed demonstrating monophasic waveforms and depressed toe pressure in the left lower extremity.  On physical exam, he has nonpalpable pulses in the feet.  Per history, wound healing has become stagnant.  I had a  long discussion with Keith Dixon regarding the above.  After discussing the risk and benefits of left lower extremity angiography in an effort to define and improve distal perfusion for wound healing, Keith Dixon elected to proceed.  He is unable to schedule Wednesday angiography, and therefore I will work to schedule him with one of my partners.   Keith John, MD [facility] [phone]

## 2021-10-11 ENCOUNTER — Ambulatory Visit (HOSPITAL_COMMUNITY)
Admission: RE | Admit: 2021-10-11 | Discharge: 2021-10-11 | Disposition: A | Discharge status: Home / Self Care | Payer: Medicare Other | Source: Ambulatory Visit | Attending: Vascular Surgery | Admitting: Vascular Surgery

## 2021-10-11 ENCOUNTER — Encounter: Payer: Self-pay | Admitting: Vascular Surgery

## 2021-10-11 ENCOUNTER — Ambulatory Visit (INDEPENDENT_AMBULATORY_CARE_PROVIDER_SITE_OTHER): Payer: Medicare Other | Admitting: Vascular Surgery

## 2021-10-11 ENCOUNTER — Other Ambulatory Visit: Payer: Self-pay

## 2021-10-11 VITALS — BP 137/91 | HR 86 | Temp 98.4°F | Resp 20 | Ht 73.0 in | Wt 210.0 lb

## 2021-10-11 DIAGNOSIS — I70229 Atherosclerosis of native arteries of extremities with rest pain, unspecified extremity: Secondary | ICD-10-CM

## 2021-10-11 DIAGNOSIS — I70222 Atherosclerosis of native arteries of extremities with rest pain, left leg: Secondary | ICD-10-CM | POA: Diagnosis not present

## 2021-10-11 DIAGNOSIS — M25569 Pain in unspecified knee: Secondary | ICD-10-CM | POA: Insufficient documentation

## 2021-10-14 ENCOUNTER — Encounter (HOSPITAL_BASED_OUTPATIENT_CLINIC_OR_DEPARTMENT_OTHER): Payer: No Typology Code available for payment source | Admitting: Internal Medicine

## 2021-10-14 NOTE — Addendum Note (Signed)
Addended by: Nicholas Lose on: 10/14/2021 01:40 PM   Modules accepted: Orders

## 2021-10-17 ENCOUNTER — Encounter (HOSPITAL_BASED_OUTPATIENT_CLINIC_OR_DEPARTMENT_OTHER): Payer: Medicare Other | Attending: Internal Medicine | Admitting: Internal Medicine

## 2021-10-17 DIAGNOSIS — F319 Bipolar disorder, unspecified: Secondary | ICD-10-CM | POA: Diagnosis not present

## 2021-10-17 DIAGNOSIS — I70244 Atherosclerosis of native arteries of left leg with ulceration of heel and midfoot: Secondary | ICD-10-CM | POA: Diagnosis not present

## 2021-10-17 DIAGNOSIS — L89623 Pressure ulcer of left heel, stage 3: Secondary | ICD-10-CM | POA: Insufficient documentation

## 2021-10-17 DIAGNOSIS — Z87891 Personal history of nicotine dependence: Secondary | ICD-10-CM | POA: Insufficient documentation

## 2021-10-17 DIAGNOSIS — F4310 Post-traumatic stress disorder, unspecified: Secondary | ICD-10-CM | POA: Diagnosis not present

## 2021-10-17 DIAGNOSIS — G40909 Epilepsy, unspecified, not intractable, without status epilepticus: Secondary | ICD-10-CM | POA: Insufficient documentation

## 2021-10-17 NOTE — Progress Notes (Signed)
AIDIN, DOANE ([id number]) Visit Report for 10/17/2021 HPI Details Patient Name: Date of Service: Mearl Latin. 10/17/2021 11:00 A M Medical Record Number: [id number] Patient Account Number: [id number] Date of Birth/Sex: Treating RN: Feb 09, 1961 (61 y.o. [name] Primary Care Provider: Reubin Milan Other Clinician: Referring Provider: Treating Provider/Extender: Chipper Herb, Roney Mans in Treatment: 9 History of Present Illness HPI Description: Admission 08/13/2021 Mr. Avyn Coate is a 61 year old male with a past medical history of seizure disorder, PTSD, bipolar disorder that presents to the clinic for a 69-monthhistory of nonhealing ulcer to the left heel. He states that he had back surgery in August 2022 and he was transferred to a rehab facility post-surgery. He states that he developed a heel wound during his stay. He states this started out with an eschar and has slowly improved over time. He has home health that comes out 3 times weekly and he reports using collagen to the area. He is using crocs to help with offloading. He currently denies signs of infection. 4/24; patient presents for follow-up. He has been using collagen to the wound bed. He has home health that helps change the dressings. He denies pain. He has no issues or complaints today. 5/1; Patient presents for follow-up. He has been using collagen to the wound bed with out issues. He denies signs of infection. He has not heard back from vein and vascular for an appointment. 5/22; patient presents for follow-up. He continues to use collagen to the wound bed. He has home health that comes out 3 times weekly to help with dressing changes. He has a vein and vascular appointment on 6/2. He has no issues or complaints today. 6/8; patient has a wound on the left heel near the tip. This is not a weightbearing surface. He had his arterial studies on the left showing an ABI of 0.56 with a TBI of 0.29  and monophasic waveforms. He is already booked for an angiogram on 6/13. He has his topical Keystone which includes vancomycin gentamicin and itraconazole. He will start this today silver alginate topping Electronic Signature(s) Signed: 10/17/2021 4:23:09 PM By: RLinton HamMD Entered By: RLinton Hamon 10/17/2021 11:33:51 -------------------------------------------------------------------------------- Physical Exam Details Patient Name: Date of Service: [name] 10/17/2021 11:00 A M Medical Record Number: [id number] Account Number: [id number] of Birth/Sex: Treating RN: [birth year](61y.o. MMarcheta GrammesPrimary Care Provider: MReubin MilanOther Clinician: Referring Provider: Treating Provider/Extender: RChipper Herb Corazon Weeks in Treatment: 9 Cardiovascular Pedal pulses absent On the left. Notes Wound exam left heel small wound with roughly 2 to 3 mm in depth. This does not probe to bone there is no evidence of surrounding infection. No debridement was required. Electronic Signature(s) Signed: 10/17/2021 4:23:09 PM By: RLinton HamMD Entered By: RLinton Hamon 10/17/2021 11:34:45 -------------------------------------------------------------------------------- Physician Orders Details Patient Name: Date of Service: [name] 10/17/2021 11:00 A M Medical Record Number: [id number] Account Number: [id number] of Birth/Sex: Treating RN: 7July 05, 1962(61y.o. MMarcheta GrammesPrimary Care Provider: MReubin MilanOther Clinician: Referring Provider: Treating Provider/Extender: RCorky Craftsin Treatment: 9 Verbal / Phone Orders: No Diagnosis Coding ICD-10 Coding Code Description [phone] ulcer of left heel, stage 3 G40.909 Epilepsy, unspecified, not intractable, without status epilepticus F43.10 Post-traumatic stress disorder, unspecified Follow-up Appointments ppointment in  2 weeks. - Monday 11/01/21 @ 10:15am with Dr. HHeber [place]([facility] Room 7) Return A Bathing/ Shower/ Hygiene  May shower and wash wound with soap and water. - when changing dressing Off-Loading Open toe surgical shoe to: - to left foot Other: - Float heels Additional Orders / Instructions Follow [facility] wound care orders this week; continue [facility] for wound care. May utilize formulary equivalent dressing for wound treatment orders unless otherwise specified. - **Apply Keystone Antibiotic Compound then Silver Alginate** Other Home Health Orders/Instructions: - [facility]: skilled nursing for wound care 3x week. Wound Treatment Wound #1 - Calcaneus Wound Laterality: Left Cleanser: Soap and Water (Home Health) 3 x Per Week/30 Days Discharge Instructions: May shower and wash wound with dial antibacterial soap and water prior to dressing change. Cleanser: Wound Cleanser (Home Health) 3 x Per Week/30 Days Discharge Instructions: Cleanse the wound with wound cleanser prior to applying a clean dressing using gauze sponges, not tissue or cotton balls. Topical: Keystone Antibiotic Compound (Home Health) 3 x Per Week/30 Days Discharge Instructions: Mix as directed on container Prim Dressing: KerraCel Ag Gelling Fiber Dressing, 2x2 in (silver alginate) (Home Health) 3 x Per Week/30 Days ary Discharge Instructions: Apply silver alginate to wound bed as instructed Secondary Dressing: ALLEVYN Heel 4 1/2in x 5 1/2in / 10.5cm x 13.5cm (Home Health) 3 x Per Week/30 Days Discharge Instructions: Apply over primary dressing as directed. Secondary Dressing: Woven Gauze Sponge, Non-Sterile 4x4 in (Home Health) 3 x Per Week/30 Days Discharge Instructions: Apply over primary dressing as directed. Secured With: [employer], 4.5x3.1 (in/yd) (Home Health) 3 x Per Week/30 Days Discharge Instructions: Secure with Kerlix as directed. Secured With: 52M Medipore H Soft Cloth Surgical T ape,  4 x 10 (in/yd) (Home Health) 3 x Per Week/30 Days Discharge Instructions: Secure with tape as directed. Electronic Signature(s) Signed: 10/17/2021 4:23:09 PM By: Linton Ham MD Signed: 10/17/2021 4:46:06 PM By: Lorrin Jackson Entered By: Lorrin Jackson on 10/17/2021 11:32:31 -------------------------------------------------------------------------------- Problem List Details Patient Name: Date of Service: [name] 10/17/2021 11:00 A M Medical Record Number: [id number] Patient Account Number: [id number] Date of Birth/Sex: Treating RN: July 03, 1960 (61 y.o. 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 10/17/2021 11:00 A M Medical Record Number: [id number] Patient Account Number: [id number] Date of Birth/Sex: Treating RN: 1961/01/26 (61 y.o. [name] Primary Care Provider: Reubin Milan Other Clinician: Referring Provider: Treating Provider/Extender: Chipper Herb, Corazon Weeks in Treatment: 9 Subjective History of Present Illness (HPI) Admission  08/13/2021 Mr. Troyce Gieske is a 61 year old male with a past medical history of seizure disorder, PTSD, bipolar disorder that presents to the clinic for a 34-monthhistory of nonhealing ulcer to the left heel. He states that he had back surgery in August 2022 and he was transferred to a rehab facility post-surgery. He states that he developed a heel wound during his stay. He states this started out  with an eschar and has slowly improved over time. He has home health that comes out 3 times weekly and he reports using collagen to the area. He is using crocs to help with offloading. He currently denies signs of infection. 4/24; patient presents for follow-up. He has been using collagen to the wound bed. He has home health that helps change the dressings. He denies pain. He has no issues or complaints today. 5/1; Patient presents for follow-up. He has been using collagen to the wound bed with out issues. He denies signs of infection. He has not heard back from vein and vascular for an appointment. 5/22; patient presents for follow-up. He continues to use collagen to the wound bed. He has home health that comes out 3 times weekly to help with dressing changes. He has a vein and vascular appointment on 6/2. He has no issues or complaints today. 6/8; patient has a wound on the left heel near the tip. This is not a weightbearing surface. He had his arterial studies on the left showing an ABI of 0.56 with a TBI of 0.29 and monophasic waveforms. He is already booked for an angiogram on 6/13. He has his topical Keystone which includes vancomycin gentamicin and itraconazole. He will start this today silver alginate topping Objective Constitutional Vitals Time Taken: 11:00 AM, Height: 72 in, Weight: 234 lbs, BMI: 31.7, Temperature: 98.9 F, Pulse: 90 bpm, Respiratory Rate: 20 breaths/min, Blood Pressure: 178/94 mmHg. Cardiovascular Pedal pulses absent On the left. General Notes: Wound exam left heel small  wound with roughly 2 to 3 mm in depth. This does not probe to bone there is no evidence of surrounding infection. No debridement was required. Integumentary (Hair, Skin) Wound #1 status is Open. Original cause of wound was Pressure Injury. The date acquired was: 04/15/2021. The wound has been in treatment 9 weeks. The wound is located on the Left Calcaneus. The wound measures 0.9cm length x 0.4cm width x 0.3cm depth; 0.283cm^2 area and 0.085cm^3 volume. There is Fat Layer (Subcutaneous Tissue) exposed. There is no tunneling or undermining noted. There is a medium amount of serosanguineous drainage noted. The wound margin is well defined and not attached to the wound base. There is large (67-100%) red, pink, friable granulation within the wound bed. There is a small (1-33%) amount of necrotic tissue within the wound bed including Adherent Slough. Assessment Active Problems ICD-10 Pressure ulcer of left heel, stage 3 Epilepsy, unspecified, not intractable, without status epilepticus Post-traumatic stress disorder, unspecified Plan Follow-up Appointments: Return Appointment in 2 weeks. - Monday 11/01/21 @ 10:15am with Dr. Heber [place] [facility], Room 7) Bathing/ Shower/ Hygiene: May shower and wash wound with soap and water. - when changing dressing Off-Loading: Open toe surgical shoe to: - to left foot Other: - Float heels Additional Orders / Instructions: Follow [facility] Home Health: New wound care orders this week; continue Home Health for wound care. May utilize formulary equivalent dressing for wound treatment orders unless otherwise specified. - **Apply Keystone Antibiotic Compound then Silver Alginate** Other Home Health Orders/Instructions: - [facility]: skilled nursing for wound care 3x week. WOUND #1: - Calcaneus Wound Laterality: Left Cleanser: Soap and Water (Home Health) 3 x Per Week/30 Days Discharge Instructions: May shower and wash wound with dial antibacterial soap and water  prior to dressing change. Cleanser: Wound Cleanser (Home Health) 3 x Per Week/30 Days Discharge Instructions: Cleanse the wound with wound cleanser prior to applying a clean dressing using gauze sponges, not tissue or cotton balls.  Topical: Keystone Antibiotic Compound (Home Health) 3 x Per Week/30 Days Discharge Instructions: Mix as directed on container Prim Dressing: KerraCel Ag Gelling Fiber Dressing, 2x2 in (silver alginate) (Home Health) 3 x Per Week/30 Days ary Discharge Instructions: Apply silver alginate to wound bed as instructed Secondary Dressing: ALLEVYN Heel 4 1/2in x 5 1/2in / 10.5cm x 13.5cm (Home Health) 3 x Per Week/30 Days Discharge Instructions: Apply over primary dressing as directed. Secondary Dressing: Woven Gauze Sponge, Non-Sterile 4x4 in (Home Health) 3 x Per Week/30 Days Discharge Instructions: Apply over primary dressing as directed. Secured With: [employer], 4.5x3.1 (in/yd) (Home Health) 3 x Per Week/30 Days Discharge Instructions: Secure with Kerlix as directed. Secured With: 35M Medipore H Soft Cloth Surgical T ape, 4 x 10 (in/yd) (Home Health) 3 x Per Week/30 Days Discharge Instructions: Secure with tape as directed. 1. The patient is already been scheduled for an angiogram on 6/13. I did not review this note. I have quoted his ABIs in the HPI very much reduced on the left as well as his TBI. 2. We will start his Keystone topical antibiotic with covering silver alginate 3. Strictly counseled about offloading this area. About sounds as though he is already doing this Electronic Signature(s) Signed: 10/17/2021 4:23:09 PM By: Linton Ham MD Entered By: Linton Ham on 10/17/2021 11:35:42 -------------------------------------------------------------------------------- SuperBill Details Patient Name: Date of Service: [name] 10/17/2021 Medical Record Number: [id number] Patient Account Number: [id number] Date of Birth/Sex: Treating  RN: 05/31/1960 (61 y.o. 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

## 2021-10-17 NOTE — Progress Notes (Signed)
Keith Dixon, Keith Dixon ([id number]) Visit Report for 10/17/2021 Arrival Information Details Patient Name: Date of Service: Keith Dixon 10/17/2021 11:00 A M Medical Record Number: [id number] Patient Account Number: [id number] Date of Birth/Sex: Treating RN: March 02, 1961 (61 y.o. Keith Dixon Primary Care Jamilett Ferrante: Reubin Milan Other Clinician: Referring Oday Ridings: Treating Danashia Landers/Extender: Corky Crafts in Treatment: 9 Visit Information History Since Last Visit Added or deleted any medications: No Patient Arrived: Keith Dixon Any new allergies or adverse reactions: No Arrival Time: 11:00 Had a fall or experienced change in No Accompanied By: family member activities of daily living that may affect Transfer Assistance: None risk of falls: Patient Identification Verified: Yes Signs or symptoms of abuse/neglect since last visito No Secondary Verification Process Completed: Yes Hospitalized since last visit: No Patient Requires Transmission-Based Precautions: No Implantable device outside of the clinic excluding No Patient Has Alerts: Yes cellular tissue based products placed in the center Patient Alerts: Patient on Blood Thinner since last visit: Has Dressing in Place as Prescribed: Yes Pain Present Now: No Electronic Signature(s) Signed: 10/17/2021 4:29:12 PM By: Deon Pilling RN, BSN Entered By: Deon Pilling on 10/17/2021 11:04:55 -------------------------------------------------------------------------------- Clinic Level of Care Assessment Details Patient Name: Date of Service: Keith Karvonen EL J. 10/17/2021 11:00 A M Medical Record Number: [id number] Patient Account Number: [id number] Date of Birth/Sex: Treating RN: June 02, 1960 (61 y.o. Keith Dixon Primary Care Van Ehlert: Reubin Milan Other Clinician: Referring Countess Biebel: Treating Pete Schnitzer/Extender: Corky Crafts in Treatment: 9 Clinic Level of Care Assessment  Items TOOL 4 Quantity Score X- 1 0 Use when only an EandM is performed on FOLLOW-UP visit ASSESSMENTS - Nursing Assessment / Reassessment X- 1 10 Reassessment of Co-morbidities (includes updates in patient status) X- 1 5 Reassessment of Adherence to Treatment Plan ASSESSMENTS - Wound and Skin A ssessment / Reassessment X - Simple Wound Assessment / Reassessment - one wound 1 5 []  - 0 Complex Wound Assessment / Reassessment - multiple wounds []  - 0 Dermatologic / Skin Assessment (not related to wound area) ASSESSMENTS - Focused Assessment []  - 0 Circumferential Edema Measurements - multi extremities []  - 0 Nutritional Assessment / Counseling / Intervention []  - 0 Lower Extremity Assessment (monofilament, tuning fork, pulses) []  - 0 Peripheral Arterial Disease Assessment (using hand held doppler) ASSESSMENTS - Ostomy and/or Continence Assessment and Care []  - 0 Incontinence Assessment and Management []  - 0 Ostomy Care Assessment and Management (repouching, etc.) PROCESS - Coordination of Care []  - 0 Simple Patient / Family Education for ongoing care X- 1 20 Complex (extensive) Patient / Family Education for ongoing care X- 1 10 Staff obtains Programmer, systems, Records, T Results / Process Orders est []  - 0 Staff telephones HHA, Nursing Homes / Clarify orders / etc []  - 0 Routine Transfer to another Facility (non-emergent condition) []  - 0 Routine Hospital Admission (non-emergent condition) []  - 0 New Admissions / Biomedical engineer / Ordering NPWT Apligraf, etc. , []  - 0 Emergency Hospital Admission (emergent condition) []  - 0 Simple Discharge Coordination []  - 0 Complex (extensive) Discharge Coordination PROCESS - Special Needs []  - 0 Pediatric / Minor Patient Management []  - 0 Isolation Patient Management []  - 0 Hearing / Language / Visual special needs []  - 0 Assessment of Community assistance (transportation, D/C planning, etc.) []  - 0 Additional  assistance / Altered mentation []  - 0 Support Surface(s) Assessment (bed, cushion, seat, etc.) INTERVENTIONS - Wound Cleansing / Measurement X - Simple Wound Cleansing - one wound 1  5 []  - 0 Complex Wound Cleansing - multiple wounds X- 1 5 Wound Imaging (photographs - any number of wounds) []  - 0 Wound Tracing (instead of photographs) X- 1 5 Simple Wound Measurement - one wound []  - 0 Complex Wound Measurement - multiple wounds INTERVENTIONS - Wound Dressings []  - 0 Small Wound Dressing one or multiple wounds X- 1 15 Medium Wound Dressing one or multiple wounds []  - 0 Large Wound Dressing one or multiple wounds []  - 0 Application of Medications - topical []  - 0 Application of Medications - injection INTERVENTIONS - Miscellaneous []  - 0 External ear exam []  - 0 Specimen Collection (cultures, biopsies, blood, body fluids, etc.) []  - 0 Specimen(s) / Culture(s) sent or taken to Lab for analysis []  - 0 Patient Transfer (multiple staff / Civil Service fast streamer / Similar devices) []  - 0 Simple Staple / Suture removal (25 or less) []  - 0 Complex Staple / Suture removal (26 or more) []  - 0 Hypo / Hyperglycemic Management (close monitor of Blood Glucose) []  - 0 Ankle / Brachial Index (ABI) - do not check if billed separately X- 1 5 Vital Signs Has the patient been seen at the hospital within the last three years: Yes Total Score: 85 Level Of Care: New/Established - Level 3 Electronic Signature(s) Signed: 10/17/2021 4:46:06 PM By: Lorrin Jackson Entered By: Lorrin Jackson on 10/17/2021 11:33:36 -------------------------------------------------------------------------------- Encounter Discharge Information Details Patient Name: Date of Service: Keith Karvonen EL J. 10/17/2021 11:00 A M Medical Record Number: [id number] Patient Account Number: [id number] Date of Birth/Sex: Treating RN: 1961-02-08 (61 y.o. Keith Dixon Primary Care Olanda Downie: Reubin Milan Other Clinician: Referring  Jamila Slatten: Treating Jimi Giza/Extender: Corky Crafts in Treatment: 9 Encounter Discharge Information Items Discharge Condition: Stable Ambulatory Status: Walker Discharge Destination: Home Transportation: Private Auto Accompanied By: daughter Schedule Follow-up Appointment: Yes Clinical Summary of Care: Provided on 10/17/2021 Form Type Recipient Paper Patient Patient Electronic Signature(s) Signed: 10/17/2021 4:46:06 PM By: Lorrin Jackson Entered By: Lorrin Jackson on 10/17/2021 11:44:17 -------------------------------------------------------------------------------- Lower Extremity Assessment Details Patient Name: Date of Service: Keith Karvonen EL J. 10/17/2021 11:00 A M Medical Record Number: [id number] Patient Account Number: [id number] Date of Birth/Sex: Treating RN: [birth year] (61 y.o. Keith Dixon Primary Care Ishaaq Penna: Reubin Milan Other Clinician: Referring Ladaysha Soutar: Treating Ronav Furney/Extender: Chipper Herb, Corazon Weeks in Treatment: 9 Edema Assessment Assessed: Shirlyn Goltz: Yes] [Right: No] Edema: [Left: Ye] [Right: s] Calf Left: Right: Point of Measurement: 35 cm From Medial Instep 38 cm Ankle Left: Right: Point of Measurement: 11 cm From Medial Instep 24 cm Vascular Assessment Pulses: Dorsalis Pedis Palpable: [Left:Yes] Electronic Signature(s) Signed: 10/17/2021 4:29:12 PM By: Deon Pilling RN, BSN Entered By: Deon Pilling on 10/17/2021 11:09:31 -------------------------------------------------------------------------------- Multi Wound Chart Details Patient Name: Date of Service: Keith Karvonen EL J. 10/17/2021 11:00 A M Medical Record Number: [id number] Patient Account Number: [id number] Date of Birth/Sex: Treating RN: [birth year] (61 y.o. Keith Dixon Primary Care Katelind Pytel: Reubin Milan Other Clinician: Referring Emmarose Klinke: Treating Kacper Cartlidge/Extender: Chipper Herb, Corazon Weeks in Treatment: 9 Vital  Signs Height(in): 72 Pulse(bpm): 90 Weight(lbs): 71 Blood Pressure(mmHg): 178/94 Body Mass Index(BMI): 31.7 Temperature(F): 98.9 Respiratory Rate(breaths/min): 20 Photos: [N/A:N/A] Left Calcaneus N/A N/A Wound Location: Pressure Injury N/A N/A Wounding Event: Pressure Ulcer N/A N/A Primary Etiology: Sleep Apnea, Hypertension, N/A N/A Comorbid History: Osteoarthritis, Seizure Disorder 04/15/2021 N/A N/A Date Acquired: 9 N/A N/A Weeks of Treatment: Open N/A N/A Wound Status: No N/A N/A Wound Recurrence: 0.9x0.4x0.3 N/A N/A Measurements  L x W x D (cm) 0.283 N/A N/A A (cm) : rea 0.085 N/A N/A Volume (cm) : 82.30% N/A N/A % Reduction in A rea: 89.40% N/A N/A % Reduction in Volume: Category/Stage III N/A N/A Classification: Medium N/A N/A Exudate A mount: Serosanguineous N/A N/A Exudate Type: red, brown N/A N/A Exudate Color: Well defined, not attached N/A N/A Wound Margin: Large (67-100%) N/A N/A Granulation A mount: Red, Pink, Friable N/A N/A Granulation Quality: Small (1-33%) N/A N/A Necrotic A mount: Fat Layer (Subcutaneous Tissue): Yes N/A N/A Exposed Structures: Fascia: No Tendon: No Muscle: No Joint: No Bone: No Small (1-33%) N/A N/A Epithelialization: Treatment Notes Electronic Signature(s) Signed: 10/17/2021 4:23:09 PM By: Linton Ham MD Signed: 10/17/2021 4:46:06 PM By: Lorrin Jackson Entered By: Linton Ham on 10/17/2021 11:32:12 -------------------------------------------------------------------------------- Multi-Disciplinary Care Plan Details Patient Name: Date of Service: Keith Karvonen EL J. 10/17/2021 11:00 A M Medical Record Number: [id number] Patient Account Number: [id number] Date of Birth/Sex: Treating RN: 1960/10/28 (61 y.o. Keith Dixon Primary Care Sakeenah Valcarcel: Reubin Milan Other Clinician: Referring Melodee Lupe: Treating Amamda Curbow/Extender: Chipper Herb, Corazon Weeks in Treatment: 9 Active  Inactive Pressure Nursing Diagnoses: Knowledge deficit related to causes and risk factors for pressure ulcer development Goals: Patient/caregiver will verbalize understanding of pressure ulcer management Date Initiated: 08/13/2021 Target Resolution Date: 10/28/2021 Goal Status: Active Interventions: Assess: immobility, friction, shearing, incontinence upon admission and as needed Assess offloading mechanisms upon admission and as needed Assess potential for pressure ulcer upon admission and as needed Provide education on pressure ulcers Notes: 09/30/21: Pressure management ongoing Wound/Skin Impairment Nursing Diagnoses: Impaired tissue integrity Goals: Patient/caregiver will verbalize understanding of skin care regimen Date Initiated: 08/13/2021 Target Resolution Date: 10/28/2021 Goal Status: Active Ulcer/skin breakdown will have a volume reduction of 30% by week 4 Date Initiated: 08/13/2021 Date Inactivated: 09/30/2021 Target Resolution Date: 09/10/2021 Goal Status: Met Ulcer/skin breakdown will have a volume reduction of 80% by week 12 Date Initiated: 09/30/2021 Target Resolution Date: 10/28/2021 Goal Status: Active Interventions: Assess patient/caregiver ability to obtain necessary supplies Assess patient/caregiver ability to perform ulcer/skin care regimen upon admission and as needed Assess ulceration(s) every visit Provide education on ulcer and skin care Treatment Activities: Topical wound management initiated : 08/13/2021 Notes: 09/30/21: Wound care regimen continues. Electronic Signature(s) Signed: 10/17/2021 4:46:06 PM By: Lorrin Jackson Signed: 10/17/2021 4:46:06 PM By: Lorrin Jackson Entered By: Lorrin Jackson on 10/17/2021 11:27:21 -------------------------------------------------------------------------------- Pain Assessment Details Patient Name: Date of Service: Keith Karvonen EL J. 10/17/2021 11:00 A M Medical Record Number: [id number] Patient Account Number:  [id number] Date of Birth/Sex: Treating RN: May 27, 1960 (61 y.o. Keith Dixon Primary Care Bess Saltzman: Reubin Milan Other Clinician: Referring Mikenzie Mccannon: Treating Oseph Imburgia/Extender: Corky Crafts in Treatment: 9 Active Problems Location of Pain Severity and Description of Pain Patient Has Paino No Site Locations Rate the pain. Current Pain Level: 0 Pain Management and Medication Current Pain Management: Medication: No Cold Application: No Rest: No Massage: No Activity: No T.E.N.S.: No Heat Application: No Leg drop or elevation: No Is the Current Pain Management Adequate: Adequate How does your wound impact your activities of daily livingo Sleep: No Bathing: No Appetite: No Relationship With Others: No Bladder Continence: No Emotions: No Bowel Continence: No Work: No Toileting: No Drive: No Dressing: No Hobbies: No Engineer, maintenance) Signed: 10/17/2021 4:29:12 PM By: Deon Pilling RN, BSN Entered By: Deon Pilling on 10/17/2021 11:05:18 -------------------------------------------------------------------------------- Patient/Caregiver Education Details Patient Name: Date of Service: Keith Karvonen EL J. 6/8/2023andnbsp11:00 A M Medical Record  Number: [id number] Patient Account Number: [id number] Date of Birth/Gender: Treating RN: Apr 18, 1961 (61 y.o. Keith Dixon Primary Care Physician: Reubin Milan Other Clinician: Referring Physician: Treating Physician/Extender: Corky Crafts in Treatment: 9 Education Assessment Education Provided To: Patient Education Topics Provided Infection: Methods: Explain/Verbal, Printed Responses: State content correctly Pressure: Methods: Explain/Verbal, Printed Responses: State content correctly Smoking and Wound Healing: Methods: Explain/Verbal, Printed Responses: State content correctly Wound/Skin Impairment: Methods: Explain/Verbal, Printed Responses: State  content correctly Electronic Signature(s) Signed: 10/17/2021 4:46:06 PM By: Lorrin Jackson Entered By: Lorrin Jackson on 10/17/2021 11:27:59 -------------------------------------------------------------------------------- Wound Assessment Details Patient Name: Date of Service: Keith Karvonen EL J. 10/17/2021 11:00 A M Medical Record Number: [id number] Patient Account Number: [id number] Date of Birth/Sex: Treating RN: 15-Apr-1961 (61 y.o. Keith Dixon Primary Care Joie Hipps: Reubin Milan Other Clinician: Referring Kianah Harries: Treating Khush Pasion/Extender: Chipper Herb, Corazon Weeks in Treatment: 9 Wound Status Wound Number: 1 Primary Etiology: Pressure Ulcer Wound Location: Left Calcaneus Wound Status: Open Wounding Event: Pressure Injury Comorbid History: Sleep Apnea, Hypertension, Osteoarthritis, Seizure Disorder Date Acquired: 04/15/2021 Weeks Of Treatment: 9 Clustered Wound: No Photos Wound Measurements Length: (cm) 0.9 Width: (cm) 0.4 Depth: (cm) 0.3 Area: (cm) 0.283 Volume: (cm) 0.085 % Reduction in Area: 82.3% % Reduction in Volume: 89.4% Epithelialization: Small (1-33%) Tunneling: No Undermining: No Wound Description Classification: Category/Stage III Wound Margin: Well defined, not attached Exudate Amount: Medium Exudate Type: Serosanguineous Exudate Color: red, brown Foul Odor After Cleansing: No Slough/Fibrino Yes Wound Bed Granulation Amount: Large (67-100%) Exposed Structure Granulation Quality: Red, Pink, Friable Fascia Exposed: No Necrotic Amount: Small (1-33%) Fat Layer (Subcutaneous Tissue) Exposed: Yes Necrotic Quality: Adherent Slough Tendon Exposed: No Muscle Exposed: No Joint Exposed: No Bone Exposed: No Treatment Notes Wound #1 (Calcaneus) Wound Laterality: Left Cleanser Soap and Water Discharge Instruction: May shower and wash wound with dial antibacterial soap and water prior to dressing change. Wound Cleanser Discharge  Instruction: Cleanse the wound with wound cleanser prior to applying a clean dressing using gauze sponges, not tissue or cotton balls. Peri-Wound Care Topical Keystone Antibiotic Compound Discharge Instruction: Mix as directed on container Primary Dressing KerraCel Ag Gelling Fiber Dressing, 2x2 in (silver alginate) Discharge Instruction: Apply silver alginate to wound bed as instructed Secondary Dressing ALLEVYN Heel 4 1/2in x 5 1/2in / 10.5cm x 13.5cm Discharge Instruction: Apply over primary dressing as directed. Woven Gauze Sponge, Non-Sterile 4x4 in Discharge Instruction: Apply over primary dressing as directed. Secured With [employer], 4.5x3.1 (in/yd) Discharge Instruction: Secure with Kerlix as directed. 59M Medipore H Soft Cloth Surgical T ape, 4 x 10 (in/yd) Discharge Instruction: Secure with tape as directed. Compression Wrap Compression Stockings Add-Ons Electronic Signature(s) Signed: 10/17/2021 4:29:12 PM By: Deon Pilling RN, BSN Entered By: Deon Pilling on 10/17/2021 11:11:58 -------------------------------------------------------------------------------- Vitals Details Patient Name: Date of Service: Keith Karvonen EL J. 10/17/2021 11:00 A M Medical Record Number: [id number] Patient Account Number: [id number] Date of Birth/Sex: Treating RN: [birth year] (61 y.o. Keith Dixon Primary Care Madolyn Ackroyd: Reubin Milan Other Clinician: Referring Layonna Dobie: Treating Khaden Gater/Extender: Chipper Herb, Corazon Weeks in Treatment: 9 Vital Signs Time Taken: 11:00 Temperature (F): 98.9 Height (in): 72 Pulse (bpm): 90 Weight (lbs): 234 Respiratory Rate (breaths/min): 20 Body Mass Index (BMI): 31.7 Blood Pressure (mmHg): 178/94 Reference Range: 80 - 120 mg / dl Electronic Signature(s) Signed: 10/17/2021 4:29:12 PM By: Deon Pilling RN, BSN Entered By: Deon Pilling on 10/17/2021 11:05:09

## 2021-10-22 ENCOUNTER — Other Ambulatory Visit: Payer: Self-pay

## 2021-10-22 ENCOUNTER — Encounter (HOSPITAL_COMMUNITY): Payer: Self-pay | Admitting: Surgery

## 2021-10-22 ENCOUNTER — Ambulatory Visit (HOSPITAL_COMMUNITY)
Admission: RE | Admit: 2021-10-22 | Discharge: 2021-10-22 | Disposition: A | Discharge status: Home / Self Care | Payer: Medicare Other | Attending: Surgery | Admitting: Surgery

## 2021-10-22 ENCOUNTER — Encounter (HOSPITAL_COMMUNITY)
Admission: RE | Disposition: A | Discharge status: Home / Self Care | Payer: Self-pay | Source: Home / Self Care | Attending: Surgery

## 2021-10-22 DIAGNOSIS — E1151 Type 2 diabetes mellitus with diabetic peripheral angiopathy without gangrene: Secondary | ICD-10-CM | POA: Insufficient documentation

## 2021-10-22 DIAGNOSIS — F17210 Nicotine dependence, cigarettes, uncomplicated: Secondary | ICD-10-CM | POA: Insufficient documentation

## 2021-10-22 DIAGNOSIS — I70244 Atherosclerosis of native arteries of left leg with ulceration of heel and midfoot: Secondary | ICD-10-CM | POA: Diagnosis not present

## 2021-10-22 DIAGNOSIS — L97429 Non-pressure chronic ulcer of left heel and midfoot with unspecified severity: Secondary | ICD-10-CM | POA: Diagnosis not present

## 2021-10-22 DIAGNOSIS — I10 Essential (primary) hypertension: Secondary | ICD-10-CM | POA: Insufficient documentation

## 2021-10-22 DIAGNOSIS — Z7982 Long term (current) use of aspirin: Secondary | ICD-10-CM | POA: Insufficient documentation

## 2021-10-22 DIAGNOSIS — E11621 Type 2 diabetes mellitus with foot ulcer: Secondary | ICD-10-CM | POA: Insufficient documentation

## 2021-10-22 DIAGNOSIS — I70245 Atherosclerosis of native arteries of left leg with ulceration of other part of foot: Secondary | ICD-10-CM | POA: Diagnosis not present

## 2021-10-22 DIAGNOSIS — I70229 Atherosclerosis of native arteries of extremities with rest pain, unspecified extremity: Secondary | ICD-10-CM

## 2021-10-22 HISTORY — PX: ABDOMINAL AORTOGRAM W/LOWER EXTREMITY: CATH118223

## 2021-10-22 HISTORY — PX: PERIPHERAL VASCULAR INTERVENTION: CATH118257

## 2021-10-22 LAB — GLUCOSE, CAPILLARY: Glucose-Capillary: 110 mg/dL — ABNORMAL HIGH (ref 70–99)

## 2021-10-22 LAB — POCT I-STAT, CHEM 8
BUN: 16 mg/dL (ref 6–20)
Calcium, Ion: 1.2 mmol/L (ref 1.15–1.40)
Chloride: 103 mmol/L (ref 98–111)
Creatinine, Ser: 1.2 mg/dL (ref 0.61–1.24)
Glucose, Bld: 108 mg/dL — ABNORMAL HIGH (ref 70–99)
HCT: 41 % (ref 39.0–52.0)
Hemoglobin: 13.9 g/dL (ref 13.0–17.0)
Potassium: 3.5 mmol/L (ref 3.5–5.1)
Sodium: 140 mmol/L (ref 135–145)
TCO2: 26 mmol/L (ref 22–32)

## 2021-10-22 LAB — POCT ACTIVATED CLOTTING TIME: Activated Clotting Time: 269 seconds

## 2021-10-22 SURGERY — ABDOMINAL AORTOGRAM W/LOWER EXTREMITY
Anesthesia: LOCAL | Laterality: Left

## 2021-10-22 MED ORDER — CLOPIDOGREL BISULFATE 75 MG PO TABS: 300.0000 mg | ORAL_TABLET | Freq: Once | ORAL | Status: DC

## 2021-10-22 MED ORDER — LIDOCAINE HCL (PF) 1 % IJ SOLN
INTRAMUSCULAR | Status: DC | PRN
  Administered 2021-10-22: 15 mL

## 2021-10-22 MED ORDER — CLOPIDOGREL BISULFATE 75 MG PO TABS: 75.0000 mg | ORAL_TABLET | Freq: Every day | ORAL | 11 refills | Status: AC

## 2021-10-22 MED ORDER — FENTANYL CITRATE (PF) 100 MCG/2ML IJ SOLN
INTRAMUSCULAR | Status: AC
  Filled 2021-10-22: qty 2

## 2021-10-22 MED ORDER — HEPARIN SODIUM (PORCINE) 1000 UNIT/ML IJ SOLN
INTRAMUSCULAR | Status: DC | PRN
  Administered 2021-10-22: 1000 [IU] via INTRAVENOUS
  Administered 2021-10-22: 10000 [IU] via INTRAVENOUS

## 2021-10-22 MED ORDER — OXYCODONE HCL 5 MG PO TABS: 5.0000 mg | ORAL_TABLET | ORAL | Status: DC | PRN

## 2021-10-22 MED ORDER — IODIXANOL 320 MG/ML IV SOLN
INTRAVENOUS | Status: DC | PRN
  Administered 2021-10-22: 140 mL

## 2021-10-22 MED ORDER — ROSUVASTATIN CALCIUM 10 MG PO TABS: 10.0000 mg | ORAL_TABLET | Freq: Every day | ORAL | 11 refills | Status: AC

## 2021-10-22 MED ORDER — MIDAZOLAM HCL 2 MG/2ML IJ SOLN
INTRAMUSCULAR | Status: AC
  Filled 2021-10-22: qty 2

## 2021-10-22 MED ORDER — SODIUM CHLORIDE 0.9% FLUSH: 3.0000 mL | INTRAVENOUS | Status: DC | PRN

## 2021-10-22 MED ORDER — HEPARIN (PORCINE) IN NACL 1000-0.9 UT/500ML-% IV SOLN
INTRAVENOUS | Status: AC
  Filled 2021-10-22: qty 1000

## 2021-10-22 MED ORDER — LABETALOL HCL 5 MG/ML IV SOLN: 10.0000 mg | INTRAVENOUS | Status: DC | PRN

## 2021-10-22 MED ORDER — FENTANYL CITRATE (PF) 100 MCG/2ML IJ SOLN
INTRAMUSCULAR | Status: DC | PRN
  Administered 2021-10-22 (×2): 25 ug via INTRAVENOUS
  Administered 2021-10-22: 50 ug via INTRAVENOUS
  Administered 2021-10-22 (×3): 25 ug via INTRAVENOUS

## 2021-10-22 MED ORDER — HEPARIN (PORCINE) IN NACL 1000-0.9 UT/500ML-% IV SOLN
INTRAVENOUS | Status: DC | PRN
  Administered 2021-10-22 (×2): 500 mL

## 2021-10-22 MED ORDER — ROSUVASTATIN CALCIUM 10 MG PO TABS
10.0000 mg | ORAL_TABLET | Freq: Every day | ORAL | Status: DC
  Filled 2021-10-22: qty 1

## 2021-10-22 MED ORDER — LIDOCAINE HCL (PF) 1 % IJ SOLN
INTRAMUSCULAR | Status: AC
  Filled 2021-10-22: qty 30

## 2021-10-22 MED ORDER — ROSUVASTATIN CALCIUM 5 MG PO TABS
10.0000 mg | ORAL_TABLET | Freq: Every day | ORAL | Status: DC
  Administered 2021-10-22: 10 mg via ORAL
  Filled 2021-10-22: qty 2

## 2021-10-22 MED ORDER — HEPARIN SODIUM (PORCINE) 1000 UNIT/ML IJ SOLN
INTRAMUSCULAR | Status: AC
Start: 2021-10-22 — End: ?
  Filled 2021-10-22: qty 10

## 2021-10-22 MED ORDER — SODIUM CHLORIDE 0.9 % WEIGHT BASED INFUSION: 1.0000 mL/kg/h | INTRAVENOUS | Status: DC

## 2021-10-22 MED ORDER — ONDANSETRON HCL 4 MG/2ML IJ SOLN: 4.0000 mg | Freq: Four times a day (QID) | INTRAMUSCULAR | Status: DC | PRN

## 2021-10-22 MED ORDER — SODIUM CHLORIDE 0.9% FLUSH: 3.0000 mL | Freq: Two times a day (BID) | INTRAVENOUS | Status: DC

## 2021-10-22 MED ORDER — MORPHINE SULFATE (PF) 2 MG/ML IV SOLN: 2.0000 mg | INTRAVENOUS | Status: DC | PRN

## 2021-10-22 MED ORDER — CLOPIDOGREL BISULFATE 75 MG PO TABS: 75.0000 mg | ORAL_TABLET | Freq: Every day | ORAL | Status: DC

## 2021-10-22 MED ORDER — MIDAZOLAM HCL 2 MG/2ML IJ SOLN
INTRAMUSCULAR | Status: DC | PRN
  Administered 2021-10-22 (×3): 1 mg via INTRAVENOUS
  Administered 2021-10-22: 2 mg via INTRAVENOUS
  Administered 2021-10-22 (×2): 1 mg via INTRAVENOUS

## 2021-10-22 MED ORDER — CLOPIDOGREL BISULFATE 300 MG PO TABS
ORAL_TABLET | ORAL | Status: DC | PRN
  Administered 2021-10-22: 300 mg via ORAL

## 2021-10-22 MED ORDER — HEPARIN SODIUM (PORCINE) 1000 UNIT/ML IJ SOLN
INTRAMUSCULAR | Status: AC
  Filled 2021-10-22: qty 10

## 2021-10-22 MED ORDER — SODIUM CHLORIDE 0.9 % IV SOLN
250.0000 mL | INTRAVENOUS | Status: DC | PRN
Start: 2021-10-22 — End: 2021-10-22

## 2021-10-22 MED ORDER — ACETAMINOPHEN 325 MG PO TABS: 650.0000 mg | ORAL_TABLET | ORAL | Status: DC | PRN

## 2021-10-22 MED ORDER — METHYLPREDNISOLONE SODIUM SUCC 125 MG IJ SOLR
125.0000 mg | INTRAMUSCULAR | Status: AC
  Administered 2021-10-22: 125 mg via INTRAVENOUS
  Filled 2021-10-22: qty 2

## 2021-10-22 MED ORDER — HYDRALAZINE HCL 20 MG/ML IJ SOLN: 5.0000 mg | INTRAMUSCULAR | Status: DC | PRN

## 2021-10-22 MED ORDER — SODIUM CHLORIDE 0.9 % IV SOLN: INTRAVENOUS | Status: DC

## 2021-10-22 MED ORDER — ASPIRIN 81 MG PO TBEC
81.0000 mg | DELAYED_RELEASE_TABLET | Freq: Every day | ORAL | Status: DC
  Administered 2021-10-22: 81 mg via ORAL
  Filled 2021-10-22: qty 1

## 2021-10-22 SURGICAL SUPPLY — 26 items
BALLN MUSTANG 6X200X135 (BALLOONS) ×2
BALLOON MUSTANG 6X200X135 (BALLOONS) IMPLANT
CATH ANGIO 5F BER2 65CM (CATHETERS) ×1 IMPLANT
CATH CROSS OVER TEMPO 5F (CATHETERS) ×1 IMPLANT
CATH MUSTANG 4X200X135 (BALLOONS) ×1 IMPLANT
CATH OMNI FLUSH 5F 65CM (CATHETERS) ×2 IMPLANT
CATH QUICKCROSS .035X135CM (MICROCATHETER) ×1 IMPLANT
CATH STRAIGHT 5FR 65CM (CATHETERS) ×1 IMPLANT
DEVICE TORQUE .025-.038 (MISCELLANEOUS) ×1 IMPLANT
DEVICE TORQUE H2O (MISCELLANEOUS) ×1 IMPLANT
DEVICE VASC CLSR CELT ART 6 (Vascular Products) ×1 IMPLANT
GLIDEWIRE ADV .035X260CM (WIRE) ×1 IMPLANT
GUIDEWIRE ANGLED .035X150CM (WIRE) ×1 IMPLANT
GUIDEWIRE ANGLED .035X260CM (WIRE) ×1 IMPLANT
KIT MICROPUNCTURE NIT STIFF (SHEATH) ×1 IMPLANT
KIT PV (KITS) ×3 IMPLANT
SHEATH FLEX ANSEL ANG 6F 45CM (SHEATH) ×1 IMPLANT
SHEATH PINNACLE 5F 10CM (SHEATH) ×1 IMPLANT
SHEATH PINNACLE 6F 10CM (SHEATH) ×1 IMPLANT
STENT ELUVIA 7X150X130 (Permanent Stent) ×3 IMPLANT
STENT ELUVIA 7X40X130 (Permanent Stent) ×1 IMPLANT
SYR MEDRAD MARK V 150ML (SYRINGE) ×1 IMPLANT
TRANSDUCER W/STOPCOCK (MISCELLANEOUS) ×3 IMPLANT
TRAY PV CATH (CUSTOM PROCEDURE TRAY) ×3 IMPLANT
WIRE BENTSON .035X145CM (WIRE) ×1 IMPLANT
WIRE HI TORQ VERSACORE 300 (WIRE) ×1 IMPLANT

## 2021-10-22 NOTE — Progress Notes (Signed)
GEVIN, PEREA ([id number]) Visit Report for 09/30/2021 Arrival Information Details Patient Name: Date of Service: Keith Dixon 09/30/2021 11:00 A M Medical Record Number: [id number] Patient Account Number: [id number] Date of Birth/Sex: Treating RN: 07-16-1960 (61 y.o. [name] Primary Care Judaea Burgoon: Reubin Milan Other Clinician: Referring Phebe Dettmer: Treating Jun Rightmyer/Extender: Toney Rakes, Corazon Weeks in Treatment: 6 Visit Information History Since Last Visit Added or deleted any medications: No Patient Arrived: [name] Any new allergies or adverse reactions: No Arrival Time: 11:06 Had a fall or experienced change in No Accompanied By: daughter activities of daily living that may affect Transfer Assistance: None risk of falls: Patient Identification Verified: Yes Signs or symptoms of abuse/neglect since last visito No Secondary Verification Process Completed: Yes Hospitalized since last visit: No Patient Requires Transmission-Based Precautions: No Implantable device outside of the clinic excluding No Patient Has Alerts: Yes cellular tissue based products placed in the center Patient Alerts: Patient on Blood Thinner since last visit: Has Dressing in Place as Prescribed: Yes Pain Present Now: No Electronic Signature(s) Signed: 10/22/2021 8:46:54 AM By: Erenest Blank Entered By: Erenest Blank on 09/30/2021 11:07:01 -------------------------------------------------------------------------------- Clinic Level of Care Assessment Details Patient Name: Date of Service: Keith Dixon. 09/30/2021 11:00 A M Medical Record Number: [id number] Patient Account Number: [id number] Date of Birth/Sex: Treating RN: 07-30-1960 (61 y.o. [name] Primary Care Reda Gettis: Reubin Milan Other Clinician: Referring Analese Sovine: Treating Harlie Buening/Extender: Toney Rakes, Corazon Weeks in Treatment: 6 Clinic Level of Care Assessment Items TOOL 4  Quantity Score X- 1 0 Use when only an EandM is performed on FOLLOW-UP visit ASSESSMENTS - Nursing Assessment / Reassessment X- 1 10 Reassessment of Co-morbidities (includes updates in patient status) X- 1 5 Reassessment of Adherence to Treatment Plan ASSESSMENTS - Wound and Skin A ssessment / Reassessment X - Simple Wound Assessment / Reassessment - one wound 1 5 _0  - 0 Complex Wound Assessment / Reassessment - multiple wounds _1  - 0 Dermatologic / Skin Assessment (not related to wound area) ASSESSMENTS - Focused Assessment _2  - 0 Circumferential Edema Measurements - multi extremities _3  - 0 Nutritional Assessment / Counseling / Intervention _4  - 0 Lower Extremity Assessment (monofilament, tuning fork, pulses) _5  - 0 Peripheral Arterial Disease Assessment (using hand held doppler) ASSESSMENTS - Ostomy and/or Continence Assessment and Care _6  - 0 Incontinence Assessment and Management _7  - 0 Ostomy Care Assessment and Management (repouching, etc.) PROCESS - Coordination of Care _8  - 0 Simple Patient / Family Education for ongoing care X- 1 20 Complex (extensive) Patient / Family Education for ongoing care _9  - 0 Staff obtains Programmer, systems, Records, T Results / Process Orders est X- 1 10 Staff telephones HHA, Nursing Homes / Clarify orders / etc _10  - 0 Routine Transfer to another Facility (non-emergent condition) _11  - 0 Routine Hospital Admission (non-emergent condition) _12  - 0 New Admissions / Biomedical engineer / Ordering NPWT Apligraf, etc. , _13  - 0 Emergency Hospital Admission (emergent condition) _14  - 0 Simple Discharge Coordination _15  - 0 Complex (extensive) Discharge Coordination PROCESS - Special Needs _16  - 0 Pediatric / Minor Patient Management _17  - 0 Isolation Patient Management _18  - 0 Hearing / Language / Visual special needs _19  - 0 Assessment of Community assistance (transportation, D/C planning, etc.) _20  - 0 Additional assistance / Altered  mentation _21  - 0 Support Surface(s) Assessment (bed, cushion, seat, etc.) INTERVENTIONS - Wound Cleansing / Measurement X - Simple Wound Cleansing - one wound 1 5 _22  -  0 Complex Wound Cleansing - multiple wounds X- 1 5 Wound Imaging (photographs - any number of wounds) _0  - 0 Wound Tracing (instead of photographs) X- 1 5 Simple Wound Measurement - one wound _1  - 0 Complex Wound Measurement - multiple wounds INTERVENTIONS - Wound Dressings X - Small Wound Dressing one or multiple wounds 1 10 _2  - 0 Medium Wound Dressing one or multiple wounds _3  - 0 Large Wound Dressing one or multiple wounds <CHENIDPOEUMPNTIR>_4<\/ERXVQMGQQPYPPJKD>_3  - 0 Application of Medications - topical <OIZTIWPYKDXIPJAS>_5<\/KNLZJQBHALPFXTKW>_4  - 0 Application of Medications - injection INTERVENTIONS - Miscellaneous _6  - 0 External ear exam X- 1 5 Specimen Collection (cultures, biopsies, blood, body fluids, etc.) X- 1 5 Specimen(s) / Culture(s) sent or taken to Lab for analysis _7  - 0 Patient Transfer (multiple staff / Harrel Lemon Lift / Similar devices) _8  - 0 Simple Staple / Suture removal (25 or less) _9  - 0 Complex Staple / Suture removal (26 or more) _10  - 0 Hypo / Hyperglycemic Management (close monitor of Blood Glucose) _11  - 0 Ankle / Brachial Index (ABI) - do not check if billed separately X- 1 5 Vital Signs Has the patient been seen at the hospital within the last three years: Yes Total Score: 90 Level Of Care: New/Established - Level 3 Electronic Signature(s) Signed: 09/30/2021 4:27:47 PM By: Lorrin Jackson Entered By: Lorrin Jackson on 09/30/2021 11:29:33 -------------------------------------------------------------------------------- Encounter Discharge Information Details Patient Name: Date of Service: Keith Dixon EL J. 09/30/2021 11:00 A M Medical Record Number: [id number] Patient Account Number: [id number] Date of Birth/Sex: Treating RN: 1960-11-27 (60 y.o. [name] Primary Care Lynee Rosenbach: Reubin Milan Other Clinician: Referring Davina Howlett: Treating  Dacian Orrico/Extender: Neldon Newport Weeks in Treatment: 6 Encounter Discharge Information Items Discharge Condition: Stable Ambulatory Status: Walker Discharge Destination: Home Transportation: Private Auto Accompanied By: Daughter Schedule Follow-up Appointment: Yes Clinical Summary of Care: Provided on 09/30/2021 Form Type Recipient Paper Patient Patient Electronic Signature(s) Signed: 09/30/2021 4:27:47 PM By: Lorrin Jackson Entered By: Lorrin Jackson on 09/30/2021 11:37:53 -------------------------------------------------------------------------------- Lower Extremity Assessment Details Patient Name: Date of Service: Keith Dixon EL J. 09/30/2021 11:00 A M Medical Record Number: [id number] Patient Account Number: [id number] Date of Birth/Sex: Treating RN: [birth year] (61 y.o. [name] Primary Care Keyli Duross: Reubin Milan Other Clinician: Referring Petrona Wyeth: Treating Todd Argabright/Extender: Toney Rakes, Corazon Weeks in Treatment: 6 Edema Assessment Assessed: [Left: No] [Right: No] Edema: [Left: Ye] [Right: s] Calf Left: Right: Point of Measurement: 35 cm From Medial Instep 40.6 cm Ankle Left: Right: Point of Measurement: 11 cm From Medial Instep 25.9 cm Vascular Assessment Pulses: Dorsalis Pedis Palpable: [Left:Yes] Electronic Signature(s) Signed: 09/30/2021 4:27:47 PM By: Lorrin Jackson Signed: 10/22/2021 8:46:54 AM By: Erenest Blank Entered By: Erenest Blank on 09/30/2021 11:13:46 -------------------------------------------------------------------------------- Multi Wound Chart Details Patient Name: Date of Service: Keith Dixon EL J. 09/30/2021 11:00 A M Medical Record Number: [id number] Patient Account Number: [id number] Date of Birth/Sex: Treating RN: [birth year] (61 y.o. [name] Primary Care Caly Pellum: Reubin Milan Other Clinician: Referring Matheson Vandehei: Treating Jacob Chamblee/Extender: Toney Rakes,  Corazon Weeks in Treatment: 6 Vital Signs Height(in): 72 Pulse(bpm): 90 Weight(lbs): 234 Blood Pressure(mmHg): 185/98 Body Mass Index(BMI): 31.7 Temperature(F): 98 Respiratory Rate(breaths/min): 16 Photos: [N/A:N/A] Left Calcaneus N/A N/A Wound Location: Pressure Injury N/A N/A Wounding Event: Pressure Ulcer N/A N/A Primary Etiology: Sleep Apnea, Hypertension, N/A N/A Comorbid History: Osteoarthritis, Seizure Disorder 04/15/2021 N/A N/A Date Acquired: 6 N/A N/A Weeks of Treatment: Open N/A N/A Wound Status: No N/A N/A Wound Recurrence: 1x0.7x0.3  N/A N/A Measurements L x W x D (cm) 0.55 N/A N/A A (cm) : rea 0.165 N/A N/A Volume (cm) : 65.70% N/A N/A % Reduction in A rea: 79.40% N/A N/A % Reduction in Volume: Category/Stage III N/A N/A Classification: Medium N/A N/A Exudate A mount: Serosanguineous N/A N/A Exudate Type: red, brown N/A N/A Exudate Color: Well defined, not attached N/A N/A Wound Margin: Medium (34-66%) N/A N/A Granulation A mount: Red, Pink, Friable N/A N/A Granulation Quality: Medium (34-66%) N/A N/A Necrotic A mount: Fat Layer (Subcutaneous Tissue): Yes N/A N/A Exposed Structures: Fascia: No Tendon: No Muscle: No Joint: No Bone: No None N/A N/A Epithelialization: Treatment Notes Electronic Signature(s) Signed: 09/30/2021 11:35:14 AM By: Kalman Shan DO Signed: 09/30/2021 4:27:47 PM By: Lorrin Jackson Entered By: Kalman Shan on 09/30/2021 11:28:42 -------------------------------------------------------------------------------- Multi-Disciplinary Care Plan Details Patient Name: Date of Service: Keith Dixon EL J. 09/30/2021 11:00 A M Medical Record Number: [id number] Patient Account Number: [id number] Date of Birth/Sex: Treating RN: 10-05-1960 (61 y.o. [name] Primary Care Makya Phillis: Reubin Milan Other Clinician: Referring Marijah Larranaga: Treating Cadan Maggart/Extender: Toney Rakes, Corazon Weeks in  Treatment: 6 Active Inactive Pressure Nursing Diagnoses: Knowledge deficit related to causes and risk factors for pressure ulcer development Goals: Patient/caregiver will verbalize understanding of pressure ulcer management Date Initiated: 08/13/2021 Target Resolution Date: 10/28/2021 Goal Status: Active Interventions: Assess: immobility, friction, shearing, incontinence upon admission and as needed Assess offloading mechanisms upon admission and as needed Assess potential for pressure ulcer upon admission and as needed Provide education on pressure ulcers Notes: 09/30/21: Pressure management ongoing Wound/Skin Impairment Nursing Diagnoses: Impaired tissue integrity Goals: Patient/caregiver will verbalize understanding of skin care regimen Date Initiated: 08/13/2021 Target Resolution Date: 10/28/2021 Goal Status: Active Ulcer/skin breakdown will have a volume reduction of 30% by week 4 Date Initiated: 08/13/2021 Date Inactivated: 09/30/2021 Target Resolution Date: 09/10/2021 Goal Status: Met Ulcer/skin breakdown will have a volume reduction of 80% by week 12 Date Initiated: 09/30/2021 Target Resolution Date: 10/28/2021 Goal Status: Active Interventions: Assess patient/caregiver ability to obtain necessary supplies Assess patient/caregiver ability to perform ulcer/skin care regimen upon admission and as needed Assess ulceration(s) every visit Provide education on ulcer and skin care Treatment Activities: Topical wound management initiated : 08/13/2021 Notes: 09/30/21: Wound care regimen continues. Electronic Signature(s) Signed: 09/30/2021 11:10:47 AM By: Lorrin Jackson Previous Signature: 09/30/2021 11:09:57 AM Version By: Lorrin Jackson Entered By: Lorrin Jackson on 09/30/2021 11:10:47 -------------------------------------------------------------------------------- Pain Assessment Details Patient Name: Date of Service: Keith Dixon EL J. 09/30/2021 11:00 A M Medical Record Number:  [id number] Patient Account Number: [id number] Date of Birth/Sex: Treating RN: May 29, 1960 (61 y.o. [name] Primary Care Theophile Harvie: Reubin Milan Other Clinician: Referring Tora Prunty: Treating Aleya Durnell/Extender: Toney Rakes, Corazon Weeks in Treatment: 6 Active Problems Location of Pain Severity and Description of Pain Patient Has Paino No Site Locations Pain Management and Medication Current Pain Management: Electronic Signature(s) Signed: 09/30/2021 4:27:47 PM By: Lorrin Jackson Signed: 10/22/2021 8:46:54 AM By: Erenest Blank Entered By: Erenest Blank on 09/30/2021 11:07:31 -------------------------------------------------------------------------------- Patient/Caregiver Education Details Patient Name: Date of Service: Keith Dixon 5/22/2023andnbsp11:00 A M Medical Record Number: [id number] Patient Account Number: [id number] Date of Birth/Gender: Treating RN: 05-Jun-1960 (61 y.o. [name] Primary Care Physician: Reubin Milan Other Clinician: Referring Physician: Treating Physician/Extender: Algis Greenhouse in Treatment: 6 Education Assessment Education Provided To: Patient Education Topics Provided Pressure: Methods: Explain/Verbal, Printed Responses: State content correctly Wound/Skin Impairment: Methods: Explain/Verbal, Printed Responses: State content correctly Electronic Signature(s)  Signed: 09/30/2021 4:27:47 PM By: Lorrin Jackson Entered By: Lorrin Jackson on 09/30/2021 11:11:06 -------------------------------------------------------------------------------- Wound Assessment Details Patient Name: Date of Service: Keith Dixon EL J. 09/30/2021 11:00 A M Medical Record Number: [id number] Patient Account Number: [id number] Date of Birth/Sex: Treating RN: January 26, 1961 (61 y.o. [name] Primary Care Ashlee Bewley: Reubin Milan Other Clinician: Referring Torii Royse: Treating Tarnisha Kachmar/Extender:  Toney Rakes, Corazon Weeks in Treatment: 6 Wound Status Wound Number: 1 Primary Etiology: Pressure Ulcer Wound Location: Left Calcaneus Wound Status: Open Wounding Event: Pressure Injury Comorbid History: Sleep Apnea, Hypertension, Osteoarthritis, Seizure Disorder Date Acquired: 04/15/2021 Weeks Of Treatment: 6 Clustered Wound: No Photos Wound Measurements Length: (cm) 1 Width: (cm) 0.7 Depth: (cm) 0.3 Area: (cm) 0.55 Volume: (cm) 0.165 % Reduction in Area: 65.7% % Reduction in Volume: 79.4% Epithelialization: None Tunneling: No Undermining: No Wound Description Classification: Category/Stage III Wound Margin: Well defined, not attached Exudate Amount: Medium Exudate Type: Serosanguineous Exudate Color: red, brown Foul Odor After Cleansing: No Slough/Fibrino Yes Wound Bed Granulation Amount: Medium (34-66%) Exposed Structure Granulation Quality: Red, Pink, Friable Fascia Exposed: No Necrotic Amount: Medium (34-66%) Fat Layer (Subcutaneous Tissue) Exposed: Yes Necrotic Quality: Adherent Slough Tendon Exposed: No Muscle Exposed: No Joint Exposed: No Bone Exposed: No Electronic Signature(s) Signed: 09/30/2021 4:27:47 PM By: Lorrin Jackson Entered By: Lorrin Jackson on 09/30/2021 11:16:00 -------------------------------------------------------------------------------- Vitals Details Patient Name: Date of Service: Keith Dixon EL J. 09/30/2021 11:00 A M Medical Record Number: [id number] Patient Account Number: [id number] Date of Birth/Sex: Treating RN: 1960-08-15 (61 y.o. [name] Primary Care Jennaya Pogue: Reubin Milan Other Clinician: Referring Eytan Carrigan: Treating Edris Friedt/Extender: Toney Rakes, Corazon Weeks in Treatment: 6 Vital Signs Time Taken: 11:07 Temperature (F): 98 Height (in): 72 Pulse (bpm): 90 Weight (lbs): 234 Respiratory Rate (breaths/min): 16 Body Mass Index (BMI): 31.7 Blood Pressure (mmHg):  185/98 Reference Range: 80 - 120 mg / dl Electronic Signature(s) Signed: 10/22/2021 8:46:54 AM By: Erenest Blank Entered By: Erenest Blank on 09/30/2021 11:07:22

## 2021-10-22 NOTE — Op Note (Signed)
Patient name: Keith Dixon MRN: [id number] DOB: [birth year] Sex: male  10/22/2021 Pre-operative Diagnosis: Left leg ulcer Post-operative diagnosis:  Same Surgeon:  Annamarie Major Procedure Performed:  1.  Ultrasound-guided access, right femoral artery  2.  Abdominal aortogram  3.  Left lower extremity runoff  4.  Stent, left superficial femoral artery  5.  Closure device, Celt  6.  Conscious sedation, 103 minutes     Indications: This is a 61 year old gentleman with a nonhealing wound on his left foot and peripheral vascular disease.  He is here today for angiogram and possible intervention.  Procedure:  The patient was identified in the holding area and taken to room 8.  The patient was then placed supine on the table and prepped and draped in the usual sterile fashion.  A time out was called.  Conscious sedation was administered with the use of IV fentanyl and Versed under continuous physician and nurse monitoring.  Heart rate, blood pressure, and oxygen saturation were continuously monitored.  Total sedation time was 103 minutes.  Ultrasound was used to evaluate the right common femoral artery.  It was patent .  A digital ultrasound image was acquired.  A micropuncture needle was used to access the right common femoral artery under ultrasound guidance.  An 018 wire was advanced without resistance and a micropuncture sheath was placed.  The 018 wire was removed and a benson wire was placed.  The micropuncture sheath was exchanged for a 5 french sheath.  An omniflush catheter was advanced over the wire to the level of L-1.  An abdominal angiogram was obtained.  Next, using the omniflush catheter and a benson wire, the aortic bifurcation was crossed and the catheter was placed into theleft external iliac artery and left runoff was obtained.    Findings:   Aortogram: Limited evaluation of the renal artery secondary to hardware in his back.  No significant stenosis was identified within the  infrarenal aorta or bilateral common or external iliac arteries.  Right Lower Extremity: Not evaluated due to contrast and patient's inability to lay still  Left Lower Extremity: Left common femoral and profundofemoral artery widely patent.  The superficial femoral artery is occluded at its origin with reconstitution of the popliteal artery at the level of the patella.  There is two-vessel runoff via the posterior tibial and peroneal artery  Intervention: After the above images were acquired the decision made to proceed with intervention.  A 6 French 45 cm sheath was advanced into the left common femoral artery.  I used a Berenstein 2 catheter to gain access into the stump of the superficial femoral artery.  I then performed subintimal recanalization using an 035 Glidewire and a quick cross catheter.  Reentry was made in the popliteal artery just at the level of the patella which was confirmed with a contrast injection.  A 035 wire was then inserted.  The subintimal tract was then predilated with a 4 mm balloon.  I then placed overlapping 7 mm Elluvia stents (7 x 150 x 3, 7 x 40).  The stents were then postdilated with a 6 mm balloon.  Completion imaging was then performed which showed a widely patent superficial femoral and popliteal artery with no change in runoff.  The long sheath was exchanged out for a short 6 French sheath and the groin was closed with a Celt.  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  #2  Two-vessel runoff on the left leg via the posterior tibial and peroneal artery  #3  The patient had significant lying flat on the table and still due to his back.     Theotis Burrow, M.D., [facility] Office: [phone] Pager:  [phone]

## 2021-10-22 NOTE — Interval H&P Note (Signed)
History and Physical Interval Note:  10/22/2021 7:42 AM  Keith Dixon  has presented today for surgery, with the diagnosis of critcal limb ischemia.  The various methods of treatment have been discussed with the patient and family. After consideration of risks, benefits and other options for treatment, the patient has consented to  Procedure(s): ABDOMINAL AORTOGRAM W/LOWER EXTREMITY (N/A) as a surgical intervention.  The patient's history has been reviewed, patient examined, no change in status, stable for surgery.  I have reviewed the patient's chart and labs.  Questions were answered to the patient's satisfaction.     Annamarie Major

## 2021-10-23 ENCOUNTER — Telehealth: Payer: Self-pay

## 2021-10-23 NOTE — Telephone Encounter (Signed)
Pt's [place] nurse called to let us know pt leg from knee down was warm. I spoke to pt. He denies any redness, swelling, pain, fever, chills. Pt has been encouraged to elevate his leg periodicall and is aware this warmth is likely from reperfusion after AGM. Pt is aware to call us back if he has any other concerns or c/o increased swelling, pain, fever, chills.

## 2021-11-01 ENCOUNTER — Encounter (HOSPITAL_BASED_OUTPATIENT_CLINIC_OR_DEPARTMENT_OTHER): Payer: Medicare Other | Admitting: Internal Medicine

## 2021-11-01 DIAGNOSIS — L89623 Pressure ulcer of left heel, stage 3: Secondary | ICD-10-CM | POA: Diagnosis not present

## 2021-11-03 ENCOUNTER — Other Ambulatory Visit: Payer: Self-pay

## 2021-11-03 DIAGNOSIS — I70229 Atherosclerosis of native arteries of extremities with rest pain, unspecified extremity: Secondary | ICD-10-CM

## 2021-11-15 ENCOUNTER — Encounter (HOSPITAL_BASED_OUTPATIENT_CLINIC_OR_DEPARTMENT_OTHER): Payer: Medicare Other | Attending: Internal Medicine | Admitting: Internal Medicine

## 2021-11-15 DIAGNOSIS — F4310 Post-traumatic stress disorder, unspecified: Secondary | ICD-10-CM | POA: Insufficient documentation

## 2021-11-15 DIAGNOSIS — I70244 Atherosclerosis of native arteries of left leg with ulceration of heel and midfoot: Secondary | ICD-10-CM | POA: Insufficient documentation

## 2021-11-15 DIAGNOSIS — L89623 Pressure ulcer of left heel, stage 3: Secondary | ICD-10-CM | POA: Insufficient documentation

## 2021-11-15 DIAGNOSIS — G40909 Epilepsy, unspecified, not intractable, without status epilepticus: Secondary | ICD-10-CM | POA: Insufficient documentation

## 2021-11-15 NOTE — Progress Notes (Addendum)
Keith Dixon ([id number]) Visit Report for 11/15/2021 Arrival Information Details Patient Name: Date of Service: Keith Dixon 11/15/2021 10:15 A M Medical Record Number: [id number] Patient Account Number: [id number] Date of Birth/Sex: Treating RN: 02-09-1961 (61 y.o. Keith Dixon Primary Care Kirke Breach: Reubin Milan Other Clinician: Referring Kimber Esterly: Treating Breckyn Troyer/Extender: Neldon Newport Weeks in Treatment: 13 Visit Information History Since Last Visit Added or deleted any medications: No Patient Arrived: Walker Any new allergies or adverse reactions: No Arrival Time: 10:32 Had a fall or experienced change in No Accompanied By: daughter activities of daily living that may affect Transfer Assistance: None risk of falls: Patient Identification Verified: Yes Signs or symptoms of abuse/neglect since last visito No Secondary Verification Process Completed: Yes Hospitalized since last visit: No Patient Requires Transmission-Based Precautions: No Implantable device outside of the clinic excluding No Patient Has Alerts: Yes cellular tissue based products placed in the center Patient Alerts: Patient on Blood Thinner since last visit: Has Dressing in Place as Prescribed: Yes Pain Present Now: No Electronic Signature(s) Signed: 11/15/2021 4:12:12 PM By: Lorrin Jackson Entered By: Lorrin Jackson on 11/15/2021 10:37:06 -------------------------------------------------------------------------------- Encounter Discharge Information Details Patient Name: Date of Service: Keith Karvonen EL J. 11/15/2021 10:15 A M Medical Record Number: [id number] Patient Account Number: [id number] Date of Birth/Sex: Treating RN: 10-Dec-1960 (61 y.o. Keith Dixon Primary Care Tyeler Goedken: Reubin Milan Other Clinician: Referring Korey Prashad: Treating Quinne Pires/Extender: Toney Rakes, Corazon Weeks in Treatment: 13 Encounter Discharge Information Items Post  Procedure Vitals Discharge Condition: Stable Temperature (F): 98.5 Ambulatory Status: Walker Pulse (bpm): 71 Discharge Destination: Home Respiratory Rate (breaths/min): 20 Transportation: Private Auto Blood Pressure (mmHg): 117/67 Accompanied By: daughter Schedule Follow-up Appointment: Yes Clinical Summary of Care: Provided on 11/15/2021 Form Type Recipient Paper Patient Patient Electronic Signature(s) Signed: 11/15/2021 4:12:12 PM By: Lorrin Jackson Entered By: Lorrin Jackson on 11/15/2021 11:11:53 -------------------------------------------------------------------------------- Lower Extremity Assessment Details Patient Name: Date of Service: Keith Cordial J. 11/15/2021 10:15 A M Medical Record Number: [id number] Patient Account Number: [id number] Date of Birth/Sex: Treating RN: April 23, 1961 (61 y.o. Keith Dixon Primary Care Aveer Bartow: Reubin Milan Other Clinician: Referring Garlin Batdorf: Treating Jaydian Santana/Extender: Toney Rakes, Corazon Weeks in Treatment: 13 Edema Assessment Assessed: [Left: Yes] [Right: No] Edema: [Left: Ye] [Right: s] Calf Left: Right: Point of Measurement: 35 cm From Medial Instep 37 cm Ankle Left: Right: Point of Measurement: 11 cm From Medial Instep 24 cm Vascular Assessment Pulses: Dorsalis Pedis Palpable: [Left:Yes] Electronic Signature(s) Signed: 11/15/2021 4:12:12 PM By: Lorrin Jackson Entered By: Lorrin Jackson on 11/15/2021 10:47:45 -------------------------------------------------------------------------------- Multi Wound Chart Details Patient Name: Date of Service: Keith Karvonen EL J. 11/15/2021 10:15 A M Medical Record Number: [id number] Patient Account Number: [id number] Date of Birth/Sex: Treating RN: [birth year] (61 y.o. Keith Dixon Primary Care Syvilla Martin: Reubin Milan Other Clinician: Referring Bereket Gernert: Treating Khalil Belote/Extender: Toney Rakes, Corazon Weeks in Treatment: 13 Vital  Signs Height(in): 72 Pulse(bpm): 71 Weight(lbs): 234 Blood Pressure(mmHg): 117/67 Body Mass Index(BMI): 31.7 Temperature(F): 98.5 Respiratory Rate(breaths/min): 20 Photos: [1:Left Calcaneus] [N/A:N/A N/A] Wound Location: [1:Pressure Injury] [N/A:N/A] Wounding Event: [1:Pressure Ulcer] [N/A:N/A] Primary Etiology: [1:Sleep Apnea, Hypertension,] [N/A:N/A] Comorbid History: [1:Osteoarthritis, Seizure Disorder 04/15/2021] [N/A:N/A] Date Acquired: [1:13] [N/A:N/A] Weeks of Treatment: [1:Open] [N/A:N/A] Wound Status: [1:No] [N/A:N/A] Wound Recurrence: [1:0.7x0.3x0.3] [N/A:N/A] Measurements L x W x D (cm) [1:0.165] [N/A:N/A] A (cm) : rea [1:0.049] [N/A:N/A] Volume (cm) : [1:89.70%] [N/A:N/A] % Reduction in A [1:rea: 93.90%] [N/A:N/A] % Reduction in Volume: [1:Category/Stage III] [N/A:N/A] Classification: [1:Medium] [  N/A:N/A] Exudate A mount: [1:Serosanguineous] [N/A:N/A] Exudate Type: [1:red, brown] [N/A:N/A] Exudate Color: [1:Well defined, not attached] [N/A:N/A] Wound Margin: [1:Large (67-100%)] [N/A:N/A] Granulation A mount: [1:Red, Pink, Friable] [N/A:N/A] Granulation Quality: [1:Small (1-33%)] [N/A:N/A] Necrotic A mount: [1:Fat Layer (Subcutaneous Tissue): Yes N/A] Exposed Structures: [1:Fascia: No Tendon: No Muscle: No Joint: No Bone: No Medium (34-66%)] [N/A:N/A] Epithelialization: [1:Debridement - Excisional] [N/A:N/A] Debridement: Pre-procedure Verification/Time Out 10:54 [N/A:N/A] Taken: [1:Lidocaine 4% Topical Solution] [N/A:N/A] Pain Control: [1:Subcutaneous, Slough] [N/A:N/A] Tissue Debrided: [1:Skin/Subcutaneous Tissue] [N/A:N/A] Level: [1:0.21] [N/A:N/A] Debridement A (sq cm): [1:rea Curette] [N/A:N/A] Instrument: [1:Minimum] [N/A:N/A] Bleeding: [1:Pressure] [N/A:N/A] Hemostasis A chieved: [1:Procedure was tolerated well] [N/A:N/A] Debridement Treatment Response: [1:0.7x0.3x0.3] [N/A:N/A] Post Debridement Measurements L x W x D (cm) [1:0.049] [N/A:N/A] Post  Debridement Volume: (cm) [1:Category/Stage III] [N/A:N/A] Post Debridement Stage: [1:Debridement] [N/A:N/A] Treatment Notes Electronic Signature(s) Signed: 11/15/2021 1:21:19 PM By: Kalman Shan DO Signed: 11/15/2021 4:12:12 PM By: Lorrin Jackson Entered By: Kalman Shan on 11/15/2021 11:03:07 -------------------------------------------------------------------------------- Multi-Disciplinary Care Plan Details Patient Name: Date of Service: Keith Karvonen EL J. 11/15/2021 10:15 A M Medical Record Number: [id number] Patient Account Number: [id number] Date of Birth/Sex: Treating RN: [birth year] (61 y.o. Keith Dixon Primary Care Fuquan Wilson: Reubin Milan Other Clinician: Referring Brit Wernette: Treating Haeli Gerlich/Extender: Toney Rakes, Corazon Weeks in Treatment: 13 Active Inactive Pressure Nursing Diagnoses: Knowledge deficit related to causes and risk factors for pressure ulcer development Goals: Patient/caregiver will verbalize understanding of pressure ulcer management Date Initiated: 08/13/2021 Target Resolution Date: 12/06/2021 Goal Status: Active Interventions: Assess: immobility, friction, shearing, incontinence upon admission and as needed Assess offloading mechanisms upon admission and as needed Assess potential for pressure ulcer upon admission and as needed Provide education on pressure ulcers Notes: 09/30/21: Pressure management ongoing Wound/Skin Impairment Nursing Diagnoses: Impaired tissue integrity Goals: Patient/caregiver will verbalize understanding of skin care regimen Date Initiated: 08/13/2021 Target Resolution Date: 12/06/2021 Goal Status: Active Ulcer/skin breakdown will have a volume reduction of 30% by week 4 Date Initiated: 08/13/2021 Date Inactivated: 09/30/2021 Target Resolution Date: 09/10/2021 Goal Status: Met Ulcer/skin breakdown will have a volume reduction of 80% by week 12 Date Initiated: 09/30/2021 Date Inactivated: 11/01/2021 Target  Resolution Date: 10/28/2021 Goal Status: Met Ulcer/skin breakdown will heal within 14 weeks Date Initiated: 11/01/2021 Target Resolution Date: 12/06/2021 Goal Status: Active Interventions: Assess patient/caregiver ability to obtain necessary supplies Assess patient/caregiver ability to perform ulcer/skin care regimen upon admission and as needed Assess ulceration(s) every visit Provide education on ulcer and skin care Treatment Activities: Topical wound management initiated : 08/13/2021 Notes: 09/30/21: Wound care regimen continues. 11/01/21: Wound greater than 80% volume reduction, new goal initiated. Wound care regimen continues. Electronic Signature(s) Signed: 11/15/2021 10:34:23 AM By: Lorrin Jackson Entered By: Lorrin Jackson on 11/15/2021 10:34:22 -------------------------------------------------------------------------------- Pain Assessment Details Patient Name: Date of Service: Keith Karvonen EL J. 11/15/2021 10:15 A M Medical Record Number: [id number] Patient Account Number: [id number] Date of Birth/Sex: Treating RN: [birth year] (61 y.o. Keith Dixon Primary Care Laporshia Hogen: Reubin Milan Other Clinician: Referring Effa Yarrow: Treating Jahmar Mckelvy/Extender: Toney Rakes, Corazon Weeks in Treatment: 13 Active Problems Location of Pain Severity and Description of Pain Patient Has Paino No Site Locations Pain Management and Medication Current Pain Management: Electronic Signature(s) Signed: 11/15/2021 4:12:12 PM By: Lorrin Jackson Entered By: Lorrin Jackson on 11/15/2021 10:40:39 -------------------------------------------------------------------------------- Patient/Caregiver Education Details Patient Name: Date of Service: Keith Dixon 7/7/2023andnbsp10:15 A M Medical Record Number: [id number] Patient Account Number: [id number] Date of Birth/Gender: Treating RN: [birth year] (61 y.o. Keith Dixon Primary Care  Physician: Reubin Milan Other  Clinician: Referring Physician: Treating Physician/Extender: Algis Greenhouse in Treatment: 13 Education Assessment Education Provided To: Patient Education Topics Provided Wound/Skin Impairment: Methods: Demonstration, Explain/Verbal, Printed Responses: State content correctly [employer]) Signed: 11/15/2021 4:12:12 PM By: Lorrin Jackson Entered By: Lorrin Jackson on 11/15/2021 10:34:39 -------------------------------------------------------------------------------- Wound Assessment Details Patient Name: Date of Service: Keith Cordial J. 11/15/2021 10:15 A M Medical Record Number: [id number] Patient Account Number: [id number] Date of Birth/Sex: Treating RN: [birth year] (61 y.o. Keith Dixon Primary Care Culver Feighner: Reubin Milan Other Clinician: Referring Abdiaziz Klahn: Treating Sama Arauz/Extender: Toney Rakes, Corazon Weeks in Treatment: 13 Wound Status Wound Number: 1 Primary Etiology: Pressure Ulcer Wound Location: Left Calcaneus Wound Status: Open Wounding Event: Pressure Injury Comorbid History: Sleep Apnea, Hypertension, Osteoarthritis, Seizure Disorder Date Acquired: 04/15/2021 Weeks Of Treatment: 13 Clustered Wound: No Photos Wound Measurements Length: (cm) 0.7 Width: (cm) 0.3 Depth: (cm) 0.3 Area: (cm) 0.165 Volume: (cm) 0.049 % Reduction in Area: 89.7% % Reduction in Volume: 93.9% Epithelialization: Medium (34-66%) Tunneling: No Undermining: No Wound Description Classification: Category/Stage III Wound Margin: Well defined, not attached Exudate Amount: Medium Exudate Type: Serosanguineous Exudate Color: red, brown Foul Odor After Cleansing: No Slough/Fibrino Yes Wound Bed Granulation Amount: Large (67-100%) Exposed Structure Granulation Quality: Red, Pink, Friable Fascia Exposed: No Necrotic Amount: Small (1-33%) Fat Layer (Subcutaneous Tissue) Exposed: Yes Necrotic Quality: Adherent Slough Tendon  Exposed: No Muscle Exposed: No Joint Exposed: No Bone Exposed: No Treatment Notes Wound #1 (Calcaneus) Wound Laterality: Left Cleanser Soap and Water Discharge Instruction: May shower and wash wound with dial antibacterial soap and water prior to dressing change. Wound Cleanser Discharge Instruction: Cleanse the wound with wound cleanser prior to applying a clean dressing using gauze sponges, not tissue or cotton balls. Peri-Wound Care Topical Keystone Antibiotic Compound Discharge Instruction: Mix as directed on container Primary Dressing KerraCel Ag Gelling Fiber Dressing, 2x2 in (silver alginate) Discharge Instruction: Apply silver alginate to wound bed as instructed Secondary Dressing ALLEVYN Heel 4 1/2in x 5 1/2in / 10.5cm x 13.5cm Discharge Instruction: Apply over primary dressing as directed. Woven Gauze Sponge, Non-Sterile 4x4 in Discharge Instruction: Apply over primary dressing as directed. Secured With [employer], 4.5x3.1 (in/yd) Discharge Instruction: Secure with Kerlix as directed. 52M Medipore H Soft Cloth Surgical T ape, 4 x 10 (in/yd) Discharge Instruction: Secure with tape as directed. Compression Wrap Compression Stockings Add-Ons Electronic Signature(s) Signed: 11/15/2021 4:12:12 PM By: Lorrin Jackson Entered By: Lorrin Jackson on 11/15/2021 10:45:14 -------------------------------------------------------------------------------- Vitals Details Patient Name: Date of Service: Keith Karvonen EL J. 11/15/2021 10:15 A M Medical Record Number: [id number] Patient Account Number: [id number] Date of Birth/Sex: Treating RN: [birth year] (61 y.o. Keith Dixon Primary Care Maurisha Mongeau: Reubin Milan Other Clinician: Referring Daniela Hernan: Treating Joey Hudock/Extender: Toney Rakes, Corazon Weeks in Treatment: 13 Vital Signs Time Taken: 10:37 Temperature (F): 98.5 Height (in): 72 Pulse (bpm): 71 Weight (lbs): 234 Respiratory Rate (breaths/min):  20 Body Mass Index (BMI): 31.7 Blood Pressure (mmHg): 117/67 Reference Range: 80 - 120 mg / dl Electronic Signature(s) Signed: 11/15/2021 4:12:12 PM By: Lorrin Jackson Entered By: Lorrin Jackson on 11/15/2021 10:40:07

## 2021-11-15 NOTE — Progress Notes (Signed)
ORMAND, SENN ([id number]) Visit Report for 11/15/2021 Chief Complaint Document Details Patient Name: Date of Service: Keith Dixon 11/15/2021 10:15 A M Medical Record Number: [id number] Patient Account Number: [id number] Date of Birth/Sex: Treating RN: 09-21-1960 (61 y.o. [name] Primary Care Provider: Reubin Milan Other Clinician: Referring Provider: Treating Provider/Extender: Toney Rakes, Corazon Weeks in Treatment: 13 Information Obtained from: Patient Chief Complaint 08/13/2021; left heel wound Electronic Signature(s) Signed: 11/15/2021 1:21:19 PM By: Kalman Shan DO Entered By: Kalman Shan on 11/15/2021 11:03:13 -------------------------------------------------------------------------------- Debridement Details Patient Name: Date of Service: Keith Karvonen EL J. 11/15/2021 10:15 A M Medical Record Number: [id number] Patient Account Number: [id number] Date of Birth/Sex: Treating RN: [birth year] (61 y.o. [name] Primary Care Provider: Reubin Milan Other Clinician: Referring Provider: Treating Provider/Extender: Neldon Newport Weeks in Treatment: 13 Debridement Performed for Assessment: Wound #1 Left Calcaneus Performed By: Physician Kalman Shan, DO Debridement Type: Debridement Level of Consciousness (Pre-procedure): Awake and Alert Pre-procedure Verification/Time Out Yes - 10:54 Taken: Start Time: 10:55 Pain Control: Lidocaine 4% T opical Solution T Area Debrided (L x W): otal 0.7 (cm) x 0.3 (cm) = 0.21 (cm) Tissue and other material debrided: Non-Viable, Slough, Subcutaneous, Slough Level: Skin/Subcutaneous Tissue Debridement Description: Excisional Instrument: Curette Bleeding: Minimum Hemostasis Achieved: Pressure End Time: 10:59 Response to Treatment: Procedure was tolerated well Level of Consciousness (Post- Awake and Alert procedure): Post Debridement Measurements of Total Wound Length:  (cm) 0.7 Stage: Category/Stage III Width: (cm) 0.3 Depth: (cm) 0.3 Volume: (cm) 0.049 Character of Wound/Ulcer Post Debridement: Stable Post Procedure Diagnosis Same as Pre-procedure Electronic Signature(s) Signed: 11/15/2021 1:21:19 PM By: Kalman Shan DO Signed: 11/15/2021 4:12:12 PM By: Lorrin Jackson Entered By: Lorrin Jackson on 11/15/2021 10:59:25 -------------------------------------------------------------------------------- HPI Details Patient Name: Date of Service: Keith Karvonen EL J. 11/15/2021 10:15 A M Medical Record Number: [id number] Patient Account Number: [id number] Date of Birth/Sex: Treating RN: 02/26/1961 (61 y.o. [name] Primary Care Provider: Reubin Milan Other Clinician: Referring Provider: Treating Provider/Extender: Toney Rakes, Corazon Weeks in Treatment: 13 History of Present Illness HPI Description: Admission 08/13/2021 Mr. Keith Dixon is a 61 year old male with a past medical history of seizure disorder, PTSD, bipolar disorder that presents to the clinic for a 44-monthhistory of nonhealing ulcer to the left heel. He states that he had back surgery in August 2022 and he was transferred to a rehab facility post-surgery. He states that he developed a heel wound during his stay. He states this started out with an eschar and has slowly improved over time. He has home health that comes out 3 times weekly and he reports using collagen to the area. He is using crocs to help with offloading. He currently denies signs of infection. 4/24; patient presents for follow-up. He has been using collagen to the wound bed. He has home health that helps change the dressings. He denies pain. He has no issues or complaints today. 5/1; Patient presents for follow-up. He has been using collagen to the wound bed with out issues. He denies signs of infection. He has not heard back from vein and vascular for an appointment. 5/22; patient presents for  follow-up. He continues to use collagen to the wound bed. He has home health that comes out 3 times weekly to help with dressing changes. He has a vein and vascular appointment on 6/2. He has no issues or complaints today. 6/8; patient has a wound on the left heel near the tip. This is  not a weightbearing surface. He had his arterial studies on the left showing an ABI of 0.56 with a TBI of 0.29 and monophasic waveforms. He is already booked for an angiogram on 6/13. He has his topical Keystone which includes vancomycin gentamicin and itraconazole. He will start this today silver alginate topping 6/23; patient presents for follow-up. He had an abdominal aortogram with lower extremity runoff on 10/22/2021 by Dr. Trula Slade. He had a left superficial femoral artery stent placed. He has no issues or complaints today. He has been using Keystone antibiotics to the wound bed. He has home health that comes out 3 times a week to help with dressing changes. 7/7; patient presents for follow-up. He has been using Keystone antibiotic with Hydrofera Blue without issues. Home health comes out and changes the dressing 3 times a week. Electronic Signature(s) Signed: 11/15/2021 1:21:19 PM By: Kalman Shan DO Entered By: Kalman Shan on 11/15/2021 11:03:32 -------------------------------------------------------------------------------- Physical Exam Details Patient Name: Date of Service: Keith Karvonen EL J. 11/15/2021 10:15 A M Medical Record Number: [id number] Patient Account Number: [id number] Date of Birth/Sex: Treating RN: Nov 02, 1960 (61 y.o. [name] Primary Care Provider: Reubin Milan Other Clinician: Referring Provider: Treating Provider/Extender: Toney Rakes, Corazon Weeks in Treatment: 13 Constitutional respirations regular, non-labored and within target range for patient.. Cardiovascular 2+ dorsalis pedis/posterior tibialis pulses. Psychiatric pleasant and  cooperative. Notes Left heel: Small open wound with nonviable tissue and granulation tissue. Postdebridement there is healthy granulation tissue present. No signs of surrounding infection. Electronic Signature(s) Signed: 11/15/2021 1:21:19 PM By: Kalman Shan DO Entered By: Kalman Shan on 11/15/2021 11:04:07 -------------------------------------------------------------------------------- Physician Orders Details Patient Name: Date of Service: Keith Karvonen EL J. 11/15/2021 10:15 A M Medical Record Number: [id number] Patient Account Number: [id number] Date of Birth/Sex: Treating RN: 1961-02-07 (61 y.o. 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 - Monday 11/29/21 @ 10:15am with Dr. Heber  [facility], Room 7) Return A Bathing/ Shower/ Hygiene May shower and wash wound with soap and water. - when changing dressing Off-Loading Open toe surgical shoe to: - to left foot Other: - Float heels Additional Orders / Instructions Follow [facility] [place] wound care orders this week; continue Home Health for wound care. May utilize formulary equivalent dressing for wound treatment orders unless otherwise specified. - **Apply Keystone Antibiotic Compound then Silver Alginate** Other Home Health Orders/Instructions: - [facility]: skilled nursing for wound care 3x week. Wound Treatment Wound #1 - Calcaneus Wound Laterality: Left Cleanser: Soap and Water (Home Health) 3 x  Per Week/30 Days Discharge Instructions: May shower and wash wound with dial antibacterial soap and water prior to dressing change. Cleanser: Wound Cleanser (Home Health) 3 x Per Week/30 Days Discharge Instructions: Cleanse the wound with wound cleanser prior to applying a clean dressing using gauze sponges, not tissue or cotton balls. Topical: Keystone Antibiotic Compound (Home Health) 3 x Per Week/30 Days Discharge Instructions: Mix as directed on container Prim Dressing: KerraCel Ag Gelling Fiber Dressing, 2x2 in (silver alginate) (Home Health) 3 x Per Week/30 Days ary Discharge Instructions: Apply silver alginate to wound bed as instructed  Secondary Dressing: ALLEVYN Heel 4 1/2in x 5 1/2in / 10.5cm x 13.5cm (Home Health) 3 x Per Week/30 Days Discharge Instructions: Apply over primary dressing as directed. Secondary Dressing: Woven Gauze Sponge, Non-Sterile 4x4 in (Home Health) 3 x Per Week/30 Days Discharge Instructions: Apply over primary dressing as directed. Secured With: [employer], 4.5x3.1 (in/yd) (Home Health) 3 x Per Week/30 Days Discharge Instructions: Secure with Kerlix as directed. Secured With: 70M Medipore H Soft Cloth Surgical T ape, 4 x 10 (in/yd) (Home Health) 3 x Per Week/30 Days Discharge Instructions: Secure with tape as directed. Electronic Signature(s) Signed: 11/15/2021 1:21:19 PM By: Kalman Shan DO Entered By: Kalman Shan on 11/15/2021 11:04:14 -------------------------------------------------------------------------------- Problem List Details Patient Name: Date of Service: Keith Karvonen EL J. 11/15/2021 10:15 A M Medical Record Number: [id number] Patient Account Number: [id number] Date of Birth/Sex: Treating RN: 1960/05/13 (61 y.o. 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 11/15/2021 10:15 A M Medical Record Number: [id number] Patient Account Number: [id number] Date of Birth/Sex: Treating RN: 1960-10-10 (61 y.o. [name] Primary Care Provider: Reubin Milan Other Clinician: Referring Provider: Treating Provider/Extender: Neldon Newport Weeks in Treatment: 13 Subjective Chief Complaint Information obtained from Patient 08/13/2021; left heel wound History of Present Illness (HPI) Admission 08/13/2021 Mr. Adorian Gwynne is a 61 year old male with a past medical history of seizure disorder, PTSD, bipolar disorder that presents to the clinic for a 28-monthhistory of nonhealing ulcer to the left heel. He states that he had back surgery in August 2022 and he was transferred to a rehab facility post-surgery. He states that he developed a heel wound during his stay. He states this started out with an eschar and has slowly improved over time. He has home health that comes out 3 times weekly and he reports using collagen to the area. He is using crocs to help with offloading. He currently denies signs of infection. 4/24; patient presents for follow-up. He has been using collagen to the wound bed. He has home health that helps change the  dressings. He denies pain. He has no issues or complaints today. 5/1; Patient presents for follow-up. He has been using collagen to the wound bed with out issues. He denies signs of infection. He has not heard back from vein and vascular for an appointment. 5/22; patient presents for follow-up. He continues to use collagen to the wound bed. He has home health that comes out 3 times weekly to help with dressing changes. He has a vein and vascular appointment on 6/2. He has no issues or complaints today. 6/8; patient has a wound on the left heel near the tip. This is not a weightbearing surface. He had his arterial studies on the left showing an ABI of 0.56 with a TBI of 0.29 and monophasic waveforms. He is already booked for an angiogram on 6/13. He has his  topical Keystone which includes vancomycin gentamicin and itraconazole. He will start this today silver alginate topping 6/23; patient presents for follow-up. He had an abdominal aortogram with lower extremity runoff on 10/22/2021 by Dr. Trula Slade. He had a left superficial femoral artery stent placed. He has no issues or complaints today. He has been using Keystone antibiotics to the wound bed. He has home health that comes out 3 times a week to help with dressing changes. 7/7; patient presents for follow-up. He has been using Keystone antibiotic with Hydrofera Blue without issues. Home health comes out and changes the dressing 3 times a week. Patient History Information obtained from Patient, Chart. Family History Cancer - Mother, Seizures - Mother, No family history of Diabetes, Heart Disease, Hereditary Spherocytosis, Hypertension, Kidney Disease, Lung Disease, Stroke, Thyroid Problems, Tuberculosis. Social History Former smoker - Quit Aug 2022, Marital Status - Single, Alcohol Use - Rarely, Drug Use - Prior History, Caffeine Use - Moderate. 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 Vitals Time Taken: 10:37 AM, Height: 72 in, Weight: 234 lbs, BMI: 31.7, Temperature: 98.5 F, Pulse: 71 bpm, Respiratory Rate: 20 breaths/min, Blood Pressure: 117/67 mmHg. Cardiovascular 2+ dorsalis pedis/posterior tibialis pulses. Psychiatric pleasant and cooperative. General Notes: Left heel: Small open wound with nonviable tissue and granulation tissue. Postdebridement there is healthy granulation tissue present. No signs of surrounding infection. Integumentary (Hair, Skin) Wound #1 status is Open. Original cause of wound was Pressure Injury. The date acquired was: 04/15/2021. The wound has been in treatment 13 weeks. The wound is located on the Left Calcaneus. The wound measures 0.7cm length x 0.3cm width x 0.3cm depth; 0.165cm^2 area and 0.049cm^3 volume. There is Fat Layer (Subcutaneous Tissue) exposed. There is no tunneling or undermining noted. There is a medium amount of serosanguineous drainage noted. The wound margin is well defined and not attached to the wound base. There is large (67-100%) red, pink, friable granulation within the wound bed. There is a small (1-33%) amount of necrotic tissue within the wound bed including Adherent Slough. 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 I debrided nonviable tissue. I recommended continuing Keystone antibiotic. We will switch the dressing to silver alginate as this will be easier to pack into the wound bed. Continue aggressive offloading. Follow-up in 2 weeks. Procedures Wound #1 Pre-procedure diagnosis of Wound #1 is a Pressure Ulcer located on the Left Calcaneus . There was a Excisional Skin/Subcutaneous Tissue Debridement with a total area of 0.21 sq cm performed by Kalman Shan, DO. With the following instrument(s): Curette to remove Non-Viable tissue/material. Material removed includes Subcutaneous Tissue and Slough and after achieving pain control using Lidocaine 4% T opical Solution. No specimens were taken. A time out was conducted at 10:54, prior to the start of the procedure. A Minimum amount of bleeding was controlled with Pressure. The procedure was tolerated well. Post Debridement Measurements: 0.7cm length x 0.3cm  width x 0.3cm depth; 0.049cm^3 volume. Post debridement Stage noted as Category/Stage III. Character of Wound/Ulcer Post Debridement is stable. Post procedure Diagnosis Wound #1: Same as Pre-Procedure Plan Follow-up Appointments: Return Appointment in 2 weeks. - Monday 11/29/21 @ 10:15am with Dr. Heber [place] Leveda Anna, Room 7) Bathing/ Shower/ Hygiene: May shower and wash wound with soap and water. - when changing dressing Off-Loading: Open toe surgical shoe to: - to left foot Other: - Float heels Additional Orders / Instructions: Follow Nutritious Diet Home Health: New wound care orders this week; continue Home Health for wound care. May utilize formulary equivalent dressing for wound treatment orders unless otherwise specified. - **Apply Keystone Antibiotic Compound then Silver Alginate** Other Home Health Orders/Instructions: - [facility]: skilled nursing for wound care 3x week. WOUND #1: - Calcaneus Wound Laterality: Left Cleanser:  Soap and Water (Home Health) 3 x Per Week/30 Days Discharge Instructions: May shower and wash wound with dial antibacterial soap and water prior to dressing change. Cleanser: Wound Cleanser (Home Health) 3 x Per Week/30 Days Discharge Instructions: Cleanse the wound with wound cleanser prior to applying a clean dressing using gauze sponges, not tissue or cotton balls. Topical: Keystone Antibiotic Compound (Home Health) 3 x Per Week/30 Days Discharge Instructions: Mix as directed on container Prim Dressing: KerraCel Ag Gelling Fiber Dressing, 2x2 in (silver alginate) (Home Health) 3 x Per Week/30 Days ary Discharge Instructions: Apply silver alginate to wound bed as instructed Secondary Dressing: ALLEVYN Heel 4 1/2in x 5 1/2in / 10.5cm x 13.5cm (Home Health) 3 x Per Week/30 Days Discharge Instructions: Apply over primary dressing as directed. Secondary Dressing: Woven Gauze Sponge, Non-Sterile 4x4 in (Home Health) 3 x Per Week/30 Days Discharge Instructions: Apply over primary dressing as directed. Secured With: [employer], 4.5x3.1 (in/yd) (Home Health) 3 x Per Week/30 Days Discharge Instructions: Secure with Kerlix as directed. Secured With: 5M Medipore H Soft Cloth Surgical T ape, 4 x 10 (in/yd) (Home Health) 3 x Per Week/30 Days Discharge Instructions: Secure with tape as directed. 1. In office sharp debridement 2. Keystone antibiotic and silver alginate 3. Aggressive offloading 4. Follow-up in 2 weeks Electronic Signature(s) Signed: 11/15/2021 1:21:19 PM By: Kalman Shan DO Entered By: Kalman Shan on 11/15/2021 11:06:18 -------------------------------------------------------------------------------- HxROS Details Patient Name: Date of Service: Keith Karvonen EL J. 11/15/2021 10:15 A M Medical Record Number: [id number] Patient Account Number: [id number] Date of Birth/Sex: Treating RN: [birth year] (61 y.o. [name] Primary Care Provider: Reubin Milan Other  Clinician: Referring Provider: Treating Provider/Extender: Toney Rakes, Corazon Weeks in Treatment: 13 Information Obtained From Patient Chart Respiratory Medical History: Positive for: Sleep Apnea Cardiovascular Medical History: Positive for: Hypertension Past Medical History Notes: CVA Aug '22 Genitourinary Medical History: Past Medical History Notes: Urinary incontinence Integumentary (Skin) Medical History: Past Medical History Notes: Dermatophytosis of groin Musculoskeletal Medical History: Positive for: Osteoarthritis Past Medical History Notes: Back Surgery Aug 2022 Neurologic Medical History: Positive for: Seizure Disorder - For 20 years Psychiatric Medical History: Past Medical History Notes: Depressive disorder PTSD Immunizations Pneumococcal Vaccine: Received Pneumococcal Vaccination: No Implantable Devices None Family and Social History Cancer: Yes - Mother; Diabetes: No; Heart Disease: No; Hereditary Spherocytosis: No; Hypertension: No; Kidney Disease: No; Lung Disease: No; Seizures: Yes - Mother; Stroke: No; Thyroid Problems: No; Tuberculosis: No; Former smoker - Quit Aug 2022; Marital Status - Single; Alcohol Use: Rarely; Drug Use: Prior History; Caffeine Use: Moderate; Financial Concerns: No; Food, Clothing or Shelter Needs: No; Support System Lacking: No; Transportation Concerns: No  Electronic Signature(s) Signed: 11/15/2021 1:21:19 PM By: Kalman Shan DO Signed: 11/15/2021 4:12:12 PM By: Lorrin Jackson Entered By: Kalman Shan on 11/15/2021 11:03:39 -------------------------------------------------------------------------------- SuperBill Details Patient Name: Date of Service: Keith Karvonen EL J. 11/15/2021 Medical Record Number: [id number] Patient Account Number: [id number] Date of Birth/Sex: Treating RN: April 05, 1961 (61 y.o. 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 G40.909 Epilepsy, unspecified, not intractable, without status epilepticus F43.10 Post-traumatic stress disorder, unspecified Facility Procedures CPT4 Code: [id number] Description: 28768 - DEB SUBQ TISSUE 20 SQ CM/< ICD-10 Diagnosis Description L89.623 Pressure ulcer of left heel, stage 3 Modifier: Quantity: 1 Physician Procedures : CPT4 Code Description Modifier [id number] - WC PHYS SUBQ TISS 20 SQ CM ICD-10 Diagnosis Description L89.623 Pressure ulcer of left heel, stage 3 Quantity: 1 Electronic Signature(s) Signed: 11/15/2021 1:21:19 PM By: Kalman Shan DO Entered By: Kalman Shan on 11/15/2021 11:06:24

## 2021-11-29 ENCOUNTER — Encounter (HOSPITAL_BASED_OUTPATIENT_CLINIC_OR_DEPARTMENT_OTHER): Payer: Medicare Other | Admitting: Internal Medicine

## 2021-11-29 ENCOUNTER — Ambulatory Visit (HOSPITAL_COMMUNITY)
Admission: RE | Admit: 2021-11-29 | Discharge: 2021-11-29 | Disposition: A | Discharge status: Home / Self Care | Payer: No Typology Code available for payment source | Source: Ambulatory Visit | Attending: Vascular Surgery | Admitting: Vascular Surgery

## 2021-11-29 ENCOUNTER — Ambulatory Visit (INDEPENDENT_AMBULATORY_CARE_PROVIDER_SITE_OTHER)
Admission: RE | Admit: 2021-11-29 | Discharge: 2021-11-29 | Disposition: A | Discharge status: Home / Self Care | Payer: No Typology Code available for payment source | Source: Ambulatory Visit | Attending: Vascular Surgery | Admitting: Vascular Surgery

## 2021-11-29 DIAGNOSIS — I70229 Atherosclerosis of native arteries of extremities with rest pain, unspecified extremity: Secondary | ICD-10-CM

## 2021-11-29 DIAGNOSIS — L89623 Pressure ulcer of left heel, stage 3: Secondary | ICD-10-CM | POA: Diagnosis not present

## 2021-12-02 NOTE — Progress Notes (Unsigned)
HISTORY AND PHYSICAL     CC:  follow up. Requesting Provider:  Reubin Milan, MD  HPI: This is a 61 y.o. male who is here today for follow up for PAD.  Pt has hx of slowly healing left lower extremity heel wound.  This wound has been present since August of last year.  Originally involving the entirety of the heel, it is now much smaller, but wound healing has become stagnant.  Pt was underwent angiogram on 10/22/2021 by Dr. Trula Slade with stenting of the left SFA.  The pt returns today for follow up.  He states the wound on his left heel is improving.  He denies any claudication, rest pain.  He has been working with PT and does not have any pain in his feet.  He goes to the wound care center 2x/2 weeks for his left heel wound.   The pt is on a statin for cholesterol management.    The pt is on an aspirin.    Other AC:  Plavix The pt is on CCB, ACEI, HCTZ, clonidine for hypertension.  The pt does not have diabetes. Tobacco hx:  former  Pt does not have family hx of AAA.  Past Medical History:  Diagnosis Date   Anxiety    Arthritis    Bipolar 1 disorder, depressed ([place])    Bipolar disorder ([place])    Depression    History of posttraumatic stress disorder (PTSD)    Hypertension    PTSD (post-traumatic stress disorder)    Seizures ([facility])    Sleep apnea    Smoker    Stroke [facility])    Tobacco use disorder     Past Surgical History:  Procedure Laterality Date   ABDOMINAL AORTOGRAM W/LOWER EXTREMITY Left 10/22/2021   Procedure: ABDOMINAL AORTOGRAM W/LOWER EXTREMITY;  Surgeon: Serafina Mitchell, MD;  Location: [place] [facility];  Service: Cardiovascular;  Laterality: Left;   ANTERIOR LATERAL LUMBAR FUSION 4 LEVELS N/A 12/12/2020   Procedure: Anterior Lateral Interbody Fusion Lumbar One-Two, Lumbar Two-Three, Lumbar Three-Four, Lumbar Four-Five;  Surgeon: Franchot Gallo, MD;  Location: [place];  Service: Urology;  Laterality: N/A;  Anterior Lateral Interbody Fusion Lumbar One-Two, Lumbar  Two-Three, Lumbar Three-Four, Lumbar Four-Five   APPLICATION OF INTRAOPERATIVE CT SCAN N/A 12/13/2020   Procedure: APPLICATION OF INTRAOPERATIVE CT SCAN;  Surgeon: Vallarie Mare, MD;  Location: [facility];  Service: Neurosurgery;  Laterality: N/A;   APPLICATION OF WOUND VAC N/A 01/17/2021   Procedure: APPLICATION OF WOUND VAC;  Surgeon: Vallarie Mare, MD;  Location: [place];  Service: Neurosurgery;  Laterality: N/A;   APPLICATION OF WOUND VAC N/A 02/26/2021   Procedure: APPLICATION OF WOUND VAC;  Surgeon: Vallarie Mare, MD;  Location: [place];  Service: Neurosurgery;  Laterality: N/A;   CYSTOSCOPY  12/12/2020   Procedure: CYSTOSCOPY, URETHRAL DILATION, DIFFICULT FOLEY INSERTION;  Surgeon: Franchot Gallo, MD;  Location: [place];  Service: Urology;;   IR FLUORO GUIDE CV LINE RIGHT  12/21/2020   IR IVC FILTER PLMT / S&I /IMG GUID/MOD SED  12/16/2020   IR REMOVAL TUN CV CATH W/O FL  12/31/2020   IR US GUIDE VASC ACCESS RIGHT  12/21/2020   LUMBAR WOUND DEBRIDEMENT N/A 02/26/2021   Procedure: LUMBAR WOUND DEBRIDEMENT;  Surgeon: Vallarie Mare, MD;  Location: [place];  Service: Neurosurgery;  Laterality: N/A;   PERIPHERAL VASCULAR INTERVENTION Left 10/22/2021   Procedure: PERIPHERAL VASCULAR INTERVENTION;  Surgeon: Serafina Mitchell, MD;  Location: [place] [facility];  Service: Cardiovascular;  Laterality: Left;  SFA   POSTERIOR LUMBAR FUSION 4 LEVEL N/A 12/13/2020   Procedure: Thoracic Ten - ILIAC FUSION, L5-S1 TLIF, Posterior osteotomies for deformity correction and decompression L2-3, L3-4, L4-5;  Surgeon: Vallarie Mare, MD;  Location: [place];  Service: Neurosurgery;  Laterality: N/A;   WOUND EXPLORATION N/A 01/17/2021   Procedure: THORACOLUMBAR WOUND EXPLORATION;  Surgeon: Vallarie Mare, MD;  Location: [place];  Service: Neurosurgery;  Laterality: N/A;    No Known Allergies  Current Outpatient Medications  Medication Sig Dispense Refill   acetaminophen (TYLENOL) 500 MG tablet Take 2 tablets  (1,000 mg total) by mouth every 6 (six) hours as needed for mild pain. 30 tablet 0   amLODipine (NORVASC) 10 MG tablet Take 10 mg by mouth every morning.     aspirin 81 MG chewable tablet Chew 1 tablet (81 mg total) by mouth daily. 60 tablet 3   baclofen (LIORESAL) 10 MG tablet Take 10 mg by mouth 2 (two) times daily.     Cholecalciferol (VITAMIN D3) 50 MCG (2000 UT) capsule Take 2,000 Units by mouth 2 (two) times daily.     cloNIDine (CATAPRES) 0.1 MG tablet Take 0.1 mg by mouth every 8 (eight) hours as needed. If systolic blood pressure greater than 160     clopidogrel (PLAVIX) 75 MG tablet Take 1 tablet (75 mg total) by mouth daily. 30 tablet 11   cyclobenzaprine (FLEXERIL) 10 MG tablet Take 1 tablet (10 mg total) by mouth 3 (three) times daily as needed for muscle spasms. (Patient taking differently: Take 10 mg by mouth every 8 (eight) hours as needed for muscle spasms.) 60 tablet 0   docusate sodium (COLACE) 100 MG capsule Take 1 capsule (100 mg total) by mouth 2 (two) times daily as needed for mild constipation. (Patient taking differently: Take 100 mg by mouth every 12 (twelve) hours as needed (for constipation).) 60 capsule 2   Ensure (ENSURE) Take 237 mLs by mouth 3 (three) times daily.     ferrous sulfate 325 (65 FE) MG tablet Take 325 mg by mouth daily with breakfast.     folic acid (FOLVITE) 1 MG tablet Take 1 tablet by mouth daily.     gabapentin (NEURONTIN) 300 MG capsule Take 1 capsule (300 mg total) by mouth 2 (two) times daily. (Patient taking differently: Take 600 mg by mouth 2 (two) times daily.)     hydrOXYzine (VISTARIL) 50 MG capsule Take 50 mg by mouth in the morning and at bedtime.     KEPPRA 750 MG tablet Take 1,500 mg by mouth 2 (two) times daily.     LAMICTAL 100 MG tablet Take 100 mg by mouth 2 (two) times daily.     lisinopril-hydrochlorothiazide (ZESTORETIC) 20-12.5 MG tablet Take 1 tablet by mouth daily.     Magnesium 400 MG TABS Take 400 mg by mouth daily. 14 tablet 0    metroNIDAZOLE (FLAGYL) 500 MG tablet Take 500 mg by mouth 3 (three) times daily.     pantoprazole (PROTONIX) 40 MG tablet Take 1 tablet (40 mg total) by mouth at bedtime. 50 tablet 2   PRESCRIPTION MEDICATION Apply 1 application. topically 3 (three) times a week. Add 1 scoop ('500mg'$ ) of compounded powder and 10 pumps of Bassa-Gel to mixing jar, mix together until mixed completely. Apply the gel to the affected area(s) daily or with dressing changes as directed POW Gentamicin-Vancomycin-Itraconazole 33-25-5% Powder Sig: Add 1 scoop ('500mg'$ ) of compounded powder and 10 pumps of  Bassa-Gel to mixing jar, mix together until mixed completely. Apply the gel to the affected area(s) daily or with dressing changes as directed     QUEtiapine (SEROQUEL) 200 MG tablet Take 200 mg by mouth at bedtime.     rosuvastatin (CRESTOR) 10 MG tablet Take 1 tablet (10 mg total) by mouth daily. 30 tablet 11   senna-docusate (SENOKOT-S) 8.6-50 MG tablet Take 2 tablets by mouth at bedtime.     sertraline (ZOLOFT) 100 MG tablet Take 100 mg by mouth daily after breakfast.     sodium bicarbonate 650 MG tablet Take 650 mg by mouth 3 (three) times daily.     vitamin B-12 (CYANOCOBALAMIN) 500 MCG tablet Take 500 mcg by mouth daily.     zinc gluconate 50 MG tablet Take 50 mg by mouth daily.     No current facility-administered medications for this visit.    Family History  Problem Relation Age of Onset   Seizures Mother    Breast cancer Mother 57   Hypertension Brother     Social History   Socioeconomic History   Marital status: Significant Other    Spouse name: Not on file   Number of children: 1   Years of education: Not on file   Highest education level: Not on file  Occupational History    Employer: [employer]  Tobacco Use   Smoking status: Former    Packs/day: 0.50    Years: 20.00    Total pack years: 10.00    Types: Cigarettes   Smokeless tobacco: Never  Vaping Use   Vaping Use: Never used   Substance and Sexual Activity   Alcohol use: Yes    Alcohol/week: 2.0 standard drinks of alcohol    Types: 2 Shots of liquor per week   Drug use: Yes    Types: Marijuana    Comment: pt stated he quit 2 weeks ago 08/10/12, pt reports doing marijuana on 11/13/12   Sexual activity: Yes  Other Topics Concern   Not on file  Social History Narrative   ** Merged History Encounter **       Social Determinants of Health   Financial Resource Strain: Not on file  Food Insecurity: Not on file  Transportation Needs: Not on file  Physical Activity: Not on file  Stress: Not on file  Social Connections: Not on file  Intimate Partner Violence: Not on file     REVIEW OF SYSTEMS:   '[X]'$  denotes positive finding, '[ ]'$  denotes negative finding Cardiac  Comments:  Chest pain or chest pressure:    Shortness of breath upon exertion:    Short of breath when lying flat:    Irregular heart rhythm:        Vascular    Pain in calf, thigh, or hip brought on by ambulation:    Pain in feet at night that wakes you up from your sleep:     Blood clot in your veins:    Leg swelling:         Pulmonary    Oxygen at home:    Productive cough:     Wheezing:         Neurologic    Sudden weakness in arms or legs:     Sudden numbness in arms or legs:     Sudden onset of difficulty speaking or slurred speech:    Temporary loss of vision in one eye:     Problems with dizziness:  Gastrointestinal    Blood in stool:     Vomited blood:         Genitourinary    Burning when urinating:     Blood in urine:        Psychiatric    Major depression:         Hematologic    Bleeding problems:    Problems with blood clotting too easily:        Skin    Rashes or ulcers: x       Constitutional    Fever or chills:      PHYSICAL EXAMINATION:  Today's Vitals   12/03/21 0813  Resp: 20  Temp: 98.3 F (36.8 C)  TempSrc: Temporal  SpO2: 95%  Weight: 248 lb 4.8 oz (112.6 kg)  Height: 6' (1.829 m)    Body mass index is 33.68 kg/m.   General:  WDWN in NAD; vital signs documented above Gait: Not observed HENT: WNL, normocephalic Pulmonary: normal non-labored breathing , without wheezing Cardiac: regular HR, without carotid bruits Abdomen: soft, NT, no masses; aortic pulse is not palpable Skin: without rashes; feet with dry skin Vascular Exam/Pulses: Palpable bilateral radial, femoral pulses.  Brisk doppler flow bilateral PT Extremities:    Musculoskeletal: no muscle wasting or atrophy  Neurologic: A&O X 3 Psychiatric:  The pt has Normal affect.   Non-Invasive Vascular Imaging:   ABI's/TBI's on 11/30/2021: Right:  0.91/0.68 - Great toe pressure: 88 Left:  1.05/0.98 - Great toe pressure: 126  Arterial duplex on 11/30/2021: +----------+--------+-----+--------+--------+--------+  LEFT      PSV cm/sRatioStenosisWaveformComments  +----------+--------+-----+--------+--------+--------+  CFA Distal174                  biphasic          +----------+--------+-----+--------+--------+--------+  DFA       136                  biphasic          +----------+--------+-----+--------+--------+--------+  POP Mid   100                  biphasic          +----------+--------+-----+--------+--------+--------+  Left Stent(s):  +---------------+--------+--------+---------+--------+  SFA            PSV cm/sStenosisWaveform Comments  +---------------+--------+--------+---------+--------+  Prox to Stent  146             triphasic          +---------------+--------+--------+---------+--------+  Proximal Stent 142             triphasic          +---------------+--------+--------+---------+--------+  Mid Stent      100             biphasic           +---------------+--------+--------+---------+--------+  Distal Stent   77              biphasic           +---------------+--------+--------+---------+--------+  Distal to Stent103              biphasic           +---------------+--------+--------+---------+--------+   Summary:  Left: Patent stent with no evidence of stenosis in the superficial femoral artery.   Previous ABI's/TBI's on 10/11/2021: Right:  0.84/0.60 - Great toe pressure: 92 Left:  0.56/0.29 - Great toe pressure:  44    ASSESSMENT/PLAN:: 61 y.o. male here for follow up  for PAD with hx of angiogram with stent to the left SFA on 10/22/2021 by Dr. Trula Slade  -pt doing well with brisk PT doppler flow bilaterally.  ABI much improved on the left and no evidence of stenosis of the left SFA stent -continue plavix and asa and statin -pt will f/u in 6 months with LLE arterial duplex and ABI.   Leontine Locket, [facility] [phone]  Clinic MD:   Carlis Abbott

## 2021-12-03 ENCOUNTER — Encounter (HOSPITAL_COMMUNITY): Payer: No Typology Code available for payment source

## 2021-12-03 ENCOUNTER — Ambulatory Visit (INDEPENDENT_AMBULATORY_CARE_PROVIDER_SITE_OTHER): Payer: No Typology Code available for payment source | Admitting: Physician Assistant

## 2021-12-03 VITALS — BP 127/87 | HR 92 | Temp 98.3°F | Resp 20 | Ht 72.0 in | Wt 248.3 lb

## 2021-12-03 DIAGNOSIS — I70229 Atherosclerosis of native arteries of extremities with rest pain, unspecified extremity: Secondary | ICD-10-CM | POA: Diagnosis not present

## 2021-12-05 ENCOUNTER — Other Ambulatory Visit: Payer: Self-pay

## 2021-12-05 DIAGNOSIS — I70222 Atherosclerosis of native arteries of extremities with rest pain, left leg: Secondary | ICD-10-CM

## 2021-12-05 DIAGNOSIS — I70229 Atherosclerosis of native arteries of extremities with rest pain, unspecified extremity: Secondary | ICD-10-CM

## 2021-12-13 ENCOUNTER — Encounter (HOSPITAL_BASED_OUTPATIENT_CLINIC_OR_DEPARTMENT_OTHER): Payer: Medicare Other | Attending: Internal Medicine | Admitting: Internal Medicine

## 2021-12-13 DIAGNOSIS — G40909 Epilepsy, unspecified, not intractable, without status epilepticus: Secondary | ICD-10-CM | POA: Insufficient documentation

## 2021-12-13 DIAGNOSIS — I70244 Atherosclerosis of native arteries of left leg with ulceration of heel and midfoot: Secondary | ICD-10-CM | POA: Insufficient documentation

## 2021-12-13 DIAGNOSIS — F4310 Post-traumatic stress disorder, unspecified: Secondary | ICD-10-CM

## 2021-12-13 DIAGNOSIS — Z87891 Personal history of nicotine dependence: Secondary | ICD-10-CM | POA: Diagnosis not present

## 2021-12-13 DIAGNOSIS — L89623 Pressure ulcer of left heel, stage 3: Secondary | ICD-10-CM | POA: Insufficient documentation

## 2021-12-13 DIAGNOSIS — F319 Bipolar disorder, unspecified: Secondary | ICD-10-CM | POA: Diagnosis not present

## 2021-12-13 NOTE — Progress Notes (Signed)
Keith Dixon, Keith Dixon ([id number]) Visit Report for 12/13/2021 Arrival Information Details Patient Name: Date of Service: Keith Dixon 12/13/2021 10:15 A M Medical Record Number: [id number] Patient Account Number: [id number] Date of Birth/Sex: Treating RN: [birth year] (61 y.o. Janyth Contes Primary Care Eleni Frank: Reubin Milan Other Clinician: Referring Ajiah Mcglinn: Treating Namon Villarin/Extender: Toney Rakes, Corazon Weeks in Treatment: 6 Visit Information History Since Last Visit Added or deleted any medications: No Patient Arrived: Walker Any new allergies or adverse reactions: No Arrival Time: 10:14 Had a fall or experienced change in No Accompanied By: daughter activities of daily living that may affect Transfer Assistance: None risk of falls: Patient Identification Verified: Yes Signs or symptoms of abuse/neglect since last visito No Secondary Verification Process Completed: Yes Hospitalized since last visit: No Patient Requires Transmission-Based Precautions: No Implantable device outside of the clinic excluding No Patient Has Alerts: Yes cellular tissue based products placed in the center Patient Alerts: Patient on Blood Thinner since last visit: Has Dressing in Place as Prescribed: Yes Pain Present Now: No Electronic Signature(s) Signed: 12/13/2021 12:51:55 PM By: Adline Peals Entered By: Adline Peals on 12/13/2021 10:19:38 -------------------------------------------------------------------------------- Clinic Level of Care Assessment Details Patient Name: Date of Service: Keith Dixon 12/13/2021 10:15 A M Medical Record Number: [id number] Patient Account Number: [id number] Date of Birth/Sex: Treating RN: 1960-09-10 (61 y.o. Janyth Contes Primary Care Channah Godeaux: Reubin Milan Other Clinician: Referring Evan Osburn: Treating Ahriyah Vannest/Extender: Toney Rakes, Corazon Weeks in Treatment: 17 Clinic Level of Care Assessment  Items TOOL 4 Quantity Score X- 1 0 Use when only an EandM is performed on FOLLOW-UP visit ASSESSMENTS - Nursing Assessment / Reassessment X- 1 10 Reassessment of Co-morbidities (includes updates in patient status) X- 1 5 Reassessment of Adherence to Treatment Plan ASSESSMENTS - Wound and Skin A ssessment / Reassessment X - Simple Wound Assessment / Reassessment - one wound 1 5 '[]'$  - 0 Complex Wound Assessment / Reassessment - multiple wounds '[]'$  - 0 Dermatologic / Skin Assessment (not related to wound area) ASSESSMENTS - Focused Assessment X- 1 5 Circumferential Edema Measurements - multi extremities '[]'$  - 0 Nutritional Assessment / Counseling / Intervention X- 1 5 Lower Extremity Assessment (monofilament, tuning fork, pulses) '[]'$  - 0 Peripheral Arterial Disease Assessment (using hand held doppler) ASSESSMENTS - Ostomy and/or Continence Assessment and Care '[]'$  - 0 Incontinence Assessment and Management '[]'$  - 0 Ostomy Care Assessment and Management (repouching, etc.) PROCESS - Coordination of Care X - Simple Patient / Family Education for ongoing care 1 15 '[]'$  - 0 Complex (extensive) Patient / Family Education for ongoing care X- 1 10 Staff obtains Programmer, systems, Records, T Results / Process Orders est '[]'$  - 0 Staff telephones HHA, Nursing Homes / Clarify orders / etc '[]'$  - 0 Routine Transfer to another Facility (non-emergent condition) '[]'$  - 0 Routine Hospital Admission (non-emergent condition) '[]'$  - 0 New Admissions / Biomedical engineer / Ordering NPWT Apligraf, etc. , '[]'$  - 0 Emergency Hospital Admission (emergent condition) X- 1 10 Simple Discharge Coordination '[]'$  - 0 Complex (extensive) Discharge Coordination PROCESS - Special Needs '[]'$  - 0 Pediatric / Minor Patient Management '[]'$  - 0 Isolation Patient Management '[]'$  - 0 Hearing / Language / Visual special needs '[]'$  - 0 Assessment of Community assistance (transportation, D/C planning, etc.) '[]'$  - 0 Additional  assistance / Altered mentation '[]'$  - 0 Support Surface(s) Assessment (bed, cushion, seat, etc.) INTERVENTIONS - Wound Cleansing / Measurement X - Simple Wound Cleansing - one wound 1 5 '[]'$  -  0 Complex Wound Cleansing - multiple wounds X- 1 5 Wound Imaging (photographs - any number of wounds) '[]'$  - 0 Wound Tracing (instead of photographs) X- 1 5 Simple Wound Measurement - one wound '[]'$  - 0 Complex Wound Measurement - multiple wounds INTERVENTIONS - Wound Dressings '[]'$  - 0 Small Wound Dressing one or multiple wounds '[]'$  - 0 Medium Wound Dressing one or multiple wounds '[]'$  - 0 Large Wound Dressing one or multiple wounds '[]'$  - 0 Application of Medications - topical '[]'$  - 0 Application of Medications - injection INTERVENTIONS - Miscellaneous '[]'$  - 0 External ear exam '[]'$  - 0 Specimen Collection (cultures, biopsies, blood, body fluids, etc.) '[]'$  - 0 Specimen(s) / Culture(s) sent or taken to Lab for analysis '[]'$  - 0 Patient Transfer (multiple staff / Civil Service fast streamer / Similar devices) '[]'$  - 0 Simple Staple / Suture removal (25 or less) '[]'$  - 0 Complex Staple / Suture removal (26 or more) '[]'$  - 0 Hypo / Hyperglycemic Management (close monitor of Blood Glucose) '[]'$  - 0 Ankle / Brachial Index (ABI) - do not check if billed separately X- 1 5 Vital Signs Has the patient been seen at the hospital within the last three years: Yes Total Score: 85 Level Of Care: New/Established - Level 3 Electronic Signature(s) Signed: 12/13/2021 12:51:55 PM By: Adline Peals Entered By: Adline Peals on 12/13/2021 11:30:14 -------------------------------------------------------------------------------- Encounter Discharge Information Details Patient Name: Date of Service: Keith Karvonen EL J. 12/13/2021 10:15 A M Medical Record Number: [id number] Patient Account Number: [id number] Date of Birth/Sex: Treating RN: 1960-09-19 (61 y.o. Janyth Contes Primary Care Antoine Fiallos: Reubin Milan Other  Clinician: Referring Denesia Donelan: Treating Lisa Blakeman/Extender: Neldon Newport Weeks in Treatment: 17 Encounter Discharge Information Items Discharge Condition: Stable Ambulatory Status: Walker Discharge Destination: Home Transportation: Private Auto Accompanied By: daughter Schedule Follow-up Appointment: Yes Clinical Summary of Care: Patient Declined Electronic Signature(s) Signed: 12/13/2021 12:51:55 PM By: Adline Peals Entered By: Adline Peals on 12/13/2021 11:29:29 -------------------------------------------------------------------------------- Lower Extremity Assessment Details Patient Name: Date of Service: Keith Karvonen EL J. 12/13/2021 10:15 A M Medical Record Number: [id number] Patient Account Number: [id number] Date of Birth/Sex: Treating RN: 1960/07/21 (61 y.o. Janyth Contes Primary Care Khyri Hinzman: Reubin Milan Other Clinician: Referring Nadie Fiumara: Treating Necia Kamm/Extender: Toney Rakes, Corazon Weeks in Treatment: 17 Edema Assessment Assessed: [Left: No] [Right: No] Edema: [Left: Ye] [Right: s] Calf Left: Right: Point of Measurement: 35 cm From Medial Instep 44.3 cm Ankle Left: Right: Point of Measurement: 11 cm From Medial Instep 27.5 cm Vascular Assessment Pulses: Dorsalis Pedis Palpable: [Left:Yes] Electronic Signature(s) Signed: 12/13/2021 12:51:55 PM By: Adline Peals Entered By: Adline Peals on 12/13/2021 10:20:52 -------------------------------------------------------------------------------- Multi Wound Chart Details Patient Name: Date of Service: Keith Karvonen EL J. 12/13/2021 10:15 A M Medical Record Number: [id number] Patient Account Number: [id number] Date of Birth/Sex: Treating RN: 1961-04-10 (61 y.o. M) Primary Care Ashante Snelling: Reubin Milan Other Clinician: Referring Denys Salinger: Treating Caileb Rhue/Extender: Toney Rakes, Corazon Weeks in Treatment: 17 Vital Signs Height(in):  72 Pulse(bpm): 30 Weight(lbs): 234 Blood Pressure(mmHg): 155/95 Body Mass Index(BMI): 31.7 Temperature(F): 98.3 Respiratory Rate(breaths/min): 18 Photos: [N/A:N/A] Left Calcaneus N/A N/A Wound Location: Pressure Injury N/A N/A Wounding Event: Pressure Ulcer N/A N/A Primary Etiology: Sleep Apnea, Hypertension, N/A N/A Comorbid History: Osteoarthritis, Seizure Disorder 04/15/2021 N/A N/A Date Acquired: 17 N/A N/A Weeks of Treatment: Open N/A N/A Wound Status: No N/A N/A Wound Recurrence: 0x0x0 N/A N/A Measurements L x W x D (cm) 0 N/A N/A A (cm) : rea 0  N/A N/A Volume (cm) : 100.00% N/A N/A % Reduction in A rea: 100.00% N/A N/A % Reduction in Volume: Category/Stage III N/A N/A Classification: None Present N/A N/A Exudate A mount: Well defined, not attached N/A N/A Wound Margin: None Present (0%) N/A N/A Granulation A mount: None Present (0%) N/A N/A Necrotic A mount: Fascia: No N/A N/A Exposed Structures: Fat Layer (Subcutaneous Tissue): No Tendon: No Muscle: No Joint: No Bone: No Large (67-100%) N/A N/A Epithelialization: Treatment Notes Wound #1 (Calcaneus) Wound Laterality: Left Cleanser Peri-Wound Care Topical Primary Dressing Secondary Dressing Secured With Compression Wrap Compression Stockings Add-Ons Electronic Signature(s) Signed: 12/13/2021 1:25:18 PM By: Kalman Shan DO Entered By: Kalman Shan on 12/13/2021 11:46:44 -------------------------------------------------------------------------------- Multi-Disciplinary Care Plan Details Patient Name: Date of Service: Keith Karvonen EL J. 12/13/2021 10:15 A M Medical Record Number: [id number] Patient Account Number: [id number] Date of Birth/Sex: Treating RN: [birth year] (61 y.o. Janyth Contes Primary Care Anju Sereno: Reubin Milan Other Clinician: Referring Demarquez Ciolek: Treating Abou Sterkel/Extender: Toney Rakes, Corazon Weeks in Treatment: 17 Active  Inactive Electronic Signature(s) Signed: 12/13/2021 12:51:55 PM By: Adline Peals Entered By: Adline Peals on 12/13/2021 11:29:47 -------------------------------------------------------------------------------- Pain Assessment Details Patient Name: Date of Service: Keith Karvonen EL J. 12/13/2021 10:15 A M Medical Record Number: [id number] Patient Account Number: [id number] Date of Birth/Sex: Treating RN: 20-Jul-1960 (61 y.o. Janyth Contes Primary Care Awanda Wilcock: Reubin Milan Other Clinician: Referring Slade Pierpoint: Treating Tejay Hubert/Extender: Toney Rakes, Corazon Weeks in Treatment: 17 Active Problems Location of Pain Severity and Description of Pain Patient Has Paino No Site Locations Rate the pain. Rate the pain. Current Pain Level: 0 Pain Management and Medication Current Pain Management: Electronic Signature(s) Signed: 12/13/2021 12:51:55 PM By: Adline Peals Entered By: Adline Peals on 12/13/2021 10:20:14 -------------------------------------------------------------------------------- Patient/Caregiver Education Details Patient Name: Date of Service: Keith Dixon 8/4/2023andnbsp10:15 A M Medical Record Number: [id number] Patient Account Number: [id number] Date of Birth/Gender: Treating RN: [birth year] (61 y.o. Janyth Contes Primary Care Physician: Reubin Milan Other Clinician: Referring Physician: Treating Physician/Extender: Neldon Newport Weeks in Treatment: 17 Education Assessment Education Provided To: Patient Education Topics Provided Wound/Skin Impairment: Methods: Explain/Verbal Responses: Reinforcements needed, State content correctly [employer]) Signed: 12/13/2021 12:51:55 PM By: Adline Peals Entered By: Adline Peals on 12/13/2021 10:23:51 -------------------------------------------------------------------------------- Wound Assessment Details Patient  Name: Date of Service: Keith Cordial J. 12/13/2021 10:15 A M Medical Record Number: [id number] Patient Account Number: [id number] Date of Birth/Sex: Treating RN: [birth year] (61 y.o. Janyth Contes Primary Care Andrews Tener: Reubin Milan Other Clinician: Referring Ottilie Wigglesworth: Treating Raphel Stickles/Extender: Toney Rakes, Corazon Weeks in Treatment: 17 Wound Status Wound Number: 1 Primary Etiology: Pressure Ulcer Wound Location: Left Calcaneus Wound Status: Open Wounding Event: Pressure Injury Comorbid History: Sleep Apnea, Hypertension, Osteoarthritis, Seizure Disorder Date Acquired: 04/15/2021 Weeks Of Treatment: 17 Clustered Wound: No Photos Wound Measurements Length: (cm) Width: (cm) Depth: (cm) Area: (cm) Volume: (cm) 0 % Reduction in Area: 100% 0 % Reduction in Volume: 100% 0 Epithelialization: Large (67-100%) 0 Tunneling: No 0 Undermining: No Wound Description Classification: Category/Stage III Wound Margin: Well defined, not attached Exudate Amount: None Present Foul Odor After Cleansing: No Slough/Fibrino No Wound Bed Granulation Amount: None Present (0%) Exposed Structure Necrotic Amount: None Present (0%) Fascia Exposed: No Fat Layer (Subcutaneous Tissue) Exposed: No Tendon Exposed: No Muscle Exposed: No Joint Exposed: No Bone Exposed: No Electronic Signature(s) Signed: 12/13/2021 12:51:55 PM By: Adline Peals Entered By: Adline Peals on 12/13/2021 10:47:28 -------------------------------------------------------------------------------- Vitals Details Patient Name: Date  of Service: Keith Dixon 12/13/2021 10:15 A M Medical Record Number: [id number] Patient Account Number: [id number] Date of Birth/Sex: Treating RN: 1960-11-19 (61 y.o. Janyth Contes Primary Care Naylene Foell: Reubin Milan Other Clinician: Referring Clarie Camey: Treating Charish Schroepfer/Extender: Toney Rakes, Corazon Weeks in Treatment: 17 Vital  Signs Time Taken: 10:19 Temperature (F): 98.3 Height (in): 72 Pulse (bpm): 76 Weight (lbs): 234 Respiratory Rate (breaths/min): 18 Body Mass Index (BMI): 31.7 Blood Pressure (mmHg): 155/95 Reference Range: 80 - 120 mg / dl Electronic Signature(s) Signed: 12/13/2021 12:51:55 PM By: Adline Peals Entered By: Adline Peals on 12/13/2021 10:20:08

## 2021-12-13 NOTE — Progress Notes (Signed)
[name], [name] ([id number]) Visit Report for 12/13/2021 Chief Complaint Document Details Patient Name: Date of Service: Keith Dixon 12/13/2021 10:15 A M Medical Record Number: [id number] Patient Account Number: [id number] Date of Birth/Sex: Treating RN: 1960/07/28 (61 y.o. M) Primary Care Provider: Reubin Milan Other Clinician: Referring Provider: Treating Provider/Extender: Toney Rakes, Corazon Weeks in Treatment: 17 Information Obtained from: Patient Chief Complaint 08/13/2021; left heel wound Electronic Signature(s) Signed: 12/13/2021 1:25:18 PM By: Kalman Shan DO Entered By: Kalman Shan on 12/13/2021 11:46:52 -------------------------------------------------------------------------------- HPI Details Patient Name: Date of Service: Keith Karvonen EL J. 12/13/2021 10:15 A M Medical Record Number: [id number] Patient Account Number: [id number] Date of Birth/Sex: Treating RN: 02/06/1961 (61 y.o. M) Primary Care Provider: Reubin Milan Other Clinician: Referring Provider: Treating Provider/Extender: Toney Rakes, Corazon Weeks in Treatment: 17 History of Present Illness HPI Description: Admission 08/13/2021 Mr. Keith Dixon is a 61 year old male with a past medical history of seizure disorder, PTSD, bipolar disorder that presents to the clinic for a 75-monthhistory of nonhealing ulcer to the left heel. He states that he had back surgery in August 2022 and he was transferred to a rehab facility post-surgery. He states that he developed a heel wound during his stay. He states this started out with an eschar and has slowly improved over time. He has home health that comes out 3 times weekly and he reports using collagen to the area. He is using crocs to help with offloading. He currently denies signs of infection. 4/24; patient presents for follow-up. He has been using collagen to the wound bed. He has home health that helps change the dressings. He  denies pain. He has no issues or complaints today. 5/1; Patient presents for follow-up. He has been using collagen to the wound bed with out issues. He denies signs of infection. He has not heard back from vein and vascular for an appointment. 5/22; patient presents for follow-up. He continues to use collagen to the wound bed. He has home health that comes out 3 times weekly to help with dressing changes. He has a vein and vascular appointment on 6/2. He has no issues or complaints today. 6/8; patient has a wound on the left heel near the tip. This is not a weightbearing surface. He had his arterial studies on the left showing an ABI of 0.56 with a TBI of 0.29 and monophasic waveforms. He is already booked for an angiogram on 6/13. He has his topical Keystone which includes vancomycin gentamicin and itraconazole. He will start this today silver alginate topping 6/23; patient presents for follow-up. He had an abdominal aortogram with lower extremity runoff on 10/22/2021 by Dr. BTrula Slade He had a left superficial femoral artery stent placed. He has no issues or complaints today. He has been using Keystone antibiotics to the wound bed. He has home health that comes out 3 times a week to help with dressing changes. 7/7; patient presents for follow-up. He has been using Keystone antibiotic with Hydrofera Blue without issues. Home health comes out and changes the dressing 3 times a week. 7/21; patient presents for follow-up. We have been using Keystone antibiotic with calcium alginate. Home health comes out 3 times a week for dressing changes. Patient has no issues or complaints today. 8/4; patient presents for follow-up. We have been using Keystone antibiotic with calcium alginate. Patient has no issues or complaints today. Electronic Signature(s) Signed: 12/13/2021 1:25:18 PM By: HKalman ShanDO Entered By: [name] 12/13/2021  11:47:21 --------------------------------------------------------------------------------  Physical Exam Details Patient Name: Date of Service: Keith Dixon 12/13/2021 10:15 A M Medical Record Number: [id number] Patient Account Number: [id number] Date of Birth/Sex: Treating RN: [birth year] (61 y.o. M) Primary Care Provider: Reubin Milan Other Clinician: Referring Provider: Treating Provider/Extender: Toney Rakes, Corazon Weeks in Treatment: 17 Constitutional respirations regular, non-labored and within target range for patient.. Cardiovascular 2+ dorsalis pedis/posterior tibialis pulses. Psychiatric pleasant and cooperative. Notes Left heel: Callus to the previous wound site. No fluctuance noted on palpation. No drainage. Electronic Signature(s) Signed: 12/13/2021 1:25:18 PM By: Kalman Shan DO Entered By: Kalman Shan on 12/13/2021 11:48:07 -------------------------------------------------------------------------------- Physician Orders Details Patient Name: Date of Service: Keith Karvonen EL J. 12/13/2021 10:15 A M Medical Record Number: [id number] Patient Account Number: [id number] Date of Birth/Sex: Treating RN: 04-07-1961 (61 y.o. Janyth Contes Primary Care Provider: Reubin Milan Other Clinician: Referring Provider: Treating Provider/Extender: Neldon Newport Weeks in Treatment: 99 Verbal / Phone Orders: No Diagnosis Coding Discharge From [facility] Services Discharge from [place]!!! Edema Control - Lymphedema / SCD / Other Moisturize legs daily. - and heels Off-Loading Other: - float heels Additional Orders / Instructions Follow [facility] home [facility] for wound care. Other Home Health Orders/Instructions: [facility]: skilled nursing for wound care 3x week. Electronic Signature(s) Signed: 12/13/2021 1:25:18 PM By: Kalman Shan DO Entered By: Kalman Shan on  12/13/2021 11:48:14 -------------------------------------------------------------------------------- Problem List Details Patient Name: Date of Service: Keith Karvonen EL J. 12/13/2021 10:15 A M Medical Record Number: [id number] Patient Account Number: [id number] Date of Birth/Sex: Treating RN: 1961-04-11 (61 y.o. 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 12/13/2021 10:15 A M Medical Record Number: [id number] Patient Account Number: [id number] Date of Birth/Sex: Treating RN: 01/03/1961 (61 y.o. M) Primary Care Provider: Reubin Milan Other Clinician: Referring Provider: Treating Provider/Extender: Toney Rakes, Corazon Weeks in Treatment: 17 Subjective Chief Complaint Information obtained from Patient 08/13/2021; left heel wound History of Present Illness (HPI) Admission 08/13/2021 Mr. Keith Dixon is a 61 year old male with a past medical history of seizure disorder, PTSD, bipolar disorder that presents to the clinic for a 109-monthhistory of nonhealing ulcer to the left heel. He states that he had back surgery in August 2022 and he was  transferred to a rehab facility post-surgery. He states that he developed a heel wound during his stay. He states this started out with an eschar and has slowly improved over time. He has home health that comes out 3 times weekly and he reports using collagen to the area. He is using crocs to help with offloading. He currently denies signs of infection. 4/24; patient presents for follow-up. He has been using collagen to the wound bed. He has home health that helps change the dressings. He denies pain. He has no issues or complaints today. 5/1; Patient presents for follow-up. He  has been using collagen to the wound bed with out issues. He denies signs of infection. He has not heard back from vein and vascular for an appointment. 5/22; patient presents for follow-up. He continues to use collagen to the wound bed. He has home health that comes out 3 times weekly to help with dressing changes. He has a vein and vascular appointment on 6/2. He has no issues or complaints today. 6/8; patient has a wound on the left heel near the tip. This is not a weightbearing surface. He had his arterial studies on the left showing an ABI of 0.56 with a TBI of 0.29 and monophasic waveforms. He is already booked for an angiogram on 6/13. He has his topical Keystone which includes vancomycin gentamicin and itraconazole. He will start this today silver alginate topping 6/23; patient presents for follow-up. He had an abdominal aortogram with lower extremity runoff on 10/22/2021 by Dr. Trula Slade. He had a left superficial femoral artery stent placed. He has no issues or complaints today. He has been using Keystone antibiotics to the wound bed. He has home health that comes out 3 times a week to help with dressing changes. 7/7; patient presents for follow-up. He has been using Keystone antibiotic with Hydrofera Blue without issues. Home health comes out and changes the dressing 3 times a week. 7/21; patient presents for  follow-up. We have been using Keystone antibiotic with calcium alginate. Home health comes out 3 times a week for dressing changes. Patient has no issues or complaints today. 8/4; patient presents for follow-up. We have been using Keystone antibiotic with calcium alginate. Patient has no issues or complaints today. Patient History Information obtained from Patient, Chart. Family History Cancer - Mother, Seizures - Mother, No family history of Diabetes, Heart Disease, Hereditary Spherocytosis, Hypertension, Kidney Disease, Lung Disease, Stroke, Thyroid Problems, Tuberculosis. Social History Former smoker - Quit Aug 2022, Marital Status - Single, Alcohol Use - Rarely, Drug Use - Prior History, Caffeine Use - Moderate. 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 Vitals Time Taken: 10:19 AM, Height: 72 in, Weight: 234 lbs, BMI: 31.7, Temperature: 98.3 F, Pulse: 76 bpm, Respiratory Rate: 18 breaths/min, Blood Pressure: 155/95 mmHg. Cardiovascular 2+ dorsalis pedis/posterior tibialis pulses. Psychiatric pleasant and cooperative. General Notes: Left heel: Callus to the previous wound site. No fluctuance noted on palpation. No drainage. Integumentary (Hair, Skin) Wound #1 status is Open. Original cause of wound was Pressure Injury. The date acquired was: 04/15/2021. The wound has been in treatment 17 weeks. The wound is located on the Left Calcaneus. The wound measures 0cm length x  0cm width x 0cm depth; 0cm^2 area and 0cm^3 volume. There is no tunneling or undermining noted. There is a none present amount of drainage noted. The wound margin is well defined and not attached to the wound base. There is no granulation within the wound bed. There is no necrotic tissue within the wound bed. 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 There is callus over the previous wound site. It appears well-healing underneath. At this time I recommended lotion at night to the area. Follow-up as needed. Plan Discharge From [facility] Services: Discharge from [place]!!! Edema Control - Lymphedema / SCD / Other: Moisturize legs daily. - and heels Off-Loading: Other: - float heels Additional Orders / Instructions: Follow Nutritious Diet Home Health: Discontinue home health for wound care. Other Home Health Orders/Instructions: [facility]: skilled nursing for wound care 3x week. 1. Discharge from clinic due to closed wound 2. Follow-up as needed Electronic Signature(s) Signed: 12/13/2021 1:25:18 PM By: Kalman Shan DO Entered By: Kalman Shan on 12/13/2021 11:49:15 -------------------------------------------------------------------------------- HxROS Details Patient Name: Date of Service: Keith Karvonen EL J. 12/13/2021 10:15 A M Medical Record Number: [id number] Patient Account Number: [id number] Date of Birth/Sex: Treating RN: Jul 09, 1960 (61 y.o. M) Primary Care Provider: Reubin Milan Other Clinician: Referring Provider: Treating Provider/Extender: Toney Rakes, Corazon Weeks in Treatment: 17 Information Obtained From Patient  Chart Respiratory Medical History: Positive for: Sleep Apnea Cardiovascular Medical History: Positive for: Hypertension Past Medical History Notes: CVA Aug '22 Genitourinary Medical History: Past Medical History Notes: Urinary incontinence Integumentary (Skin) Medical History: Past Medical History Notes: Dermatophytosis of groin Musculoskeletal Medical History: Positive for: Osteoarthritis Past Medical History Notes: Back Surgery Aug 2022 Neurologic Medical History: Positive for: Seizure Disorder - For 20 years Psychiatric Medical History: Past Medical History Notes: Depressive disorder PTSD Immunizations Pneumococcal Vaccine: Received Pneumococcal Vaccination: No Implantable Devices None Family and Social History Cancer: Yes - Mother; Diabetes: No; Heart Disease: No; Hereditary Spherocytosis: No; Hypertension: No; Kidney Disease: No; Lung Disease: No; Seizures: Yes - Mother; Stroke: No; Thyroid Problems: No; Tuberculosis: No; Former smoker - Quit Aug 2022; Marital Status - Single; Alcohol Use: Rarely; Drug Use: Prior History; Caffeine Use: Moderate; Financial Concerns: No; Food, Clothing or Shelter Needs: No; Support System Lacking: No; Transportation Concerns: No Electronic Signature(s) Signed: 12/13/2021 1:25:18 PM By: Kalman Shan DO Entered By: Kalman Shan on 12/13/2021 11:47:26 -------------------------------------------------------------------------------- SuperBill Details Patient Name: Date of Service: Keith Dixon. 12/13/2021 Medical Record Number: [id number] Patient Account Number: [id number] Date of Birth/Sex: Treating RN: 27-Jun-1960 (61 y.o. Janyth Contes Primary Care Provider: Reubin Milan Other Clinician: Referring Provider: Treating Provider/Extender: Toney Rakes, Corazon Weeks in Treatment: 17 Diagnosis Coding ICD-10 Codes Code Description [phone] Pressure ulcer of left heel, stage 3 I70.244 Atherosclerosis  of native arteries of left leg with ulceration of heel and midfoot G40.909 Epilepsy, unspecified, not intractable, without status epilepticus F43.10 Post-traumatic stress disorder, unspecified Facility Procedures CPT4 Code: [id number] Description: 99213 - WOUND CARE VISIT-LEV 3 EST PT Modifier: Quantity: 1 Physician Procedures : CPT4 Code Description Modifier [id number] 99213 - WC PHYS LEVEL 3 - EST PT ICD-10 Diagnosis Description L89.623 Pressure ulcer of left heel, stage 3 I70.244 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

## 2022-03-18 IMAGING — MR MR HEAD W/O CM
8 of 9 series · 41 of 48 positions shown · non-contrast
Comparison: CT 12/18/2020.  MRI 09/11/2020.

CLINICAL DATA: Stroke, follow-up. Subacute left thalamic
infarction. Parietal and occipital infarctions.



[Series 5: DWI · axial · 3.0mm · 0.88mm/px · z∈[+81,+230]mm · 11 of 104 slices shown (1 of 4)]
[im 1/104]
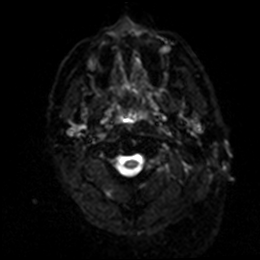
[im 11/104]
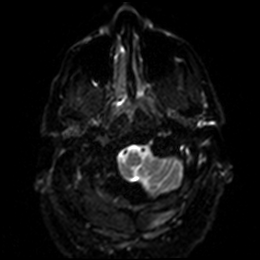
[im 21/104]
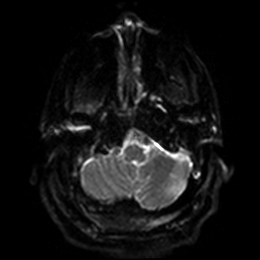
[im 31/104]
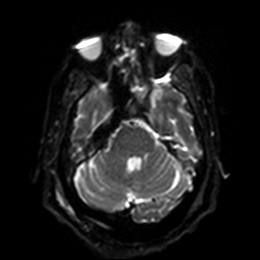
[im 42/104]
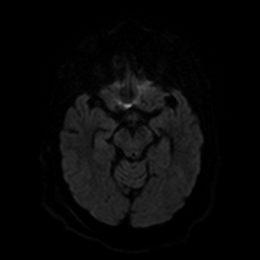
[im 52/104]
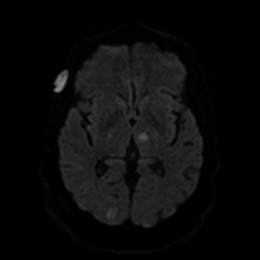
[im 62/104]
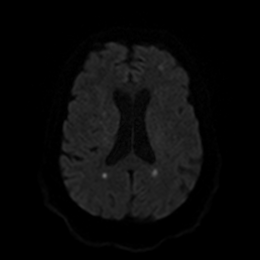
[im 73/104]
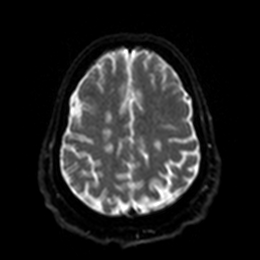
[im 83/104]
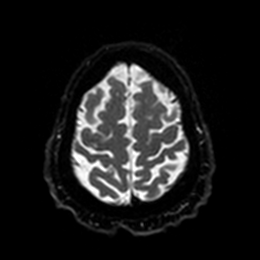
[im 93/104]
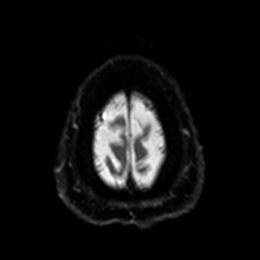
[im 104/104]
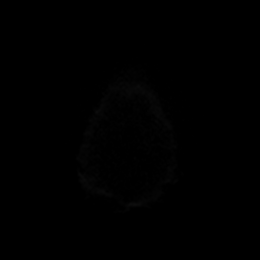

[Series 6: DWI · axial · 3.0mm · 0.88mm/px · z∈[+81,+230]mm · 6 of 52 slices shown (2 of 4)]
[im 1/52]
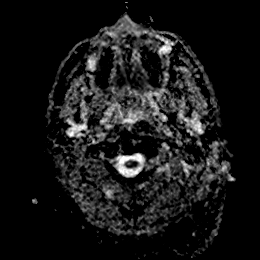
[im 11/52]
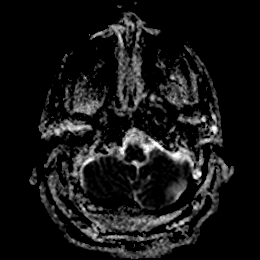
[im 21/52]
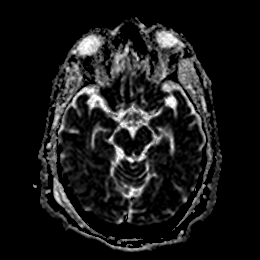
[im 31/52]
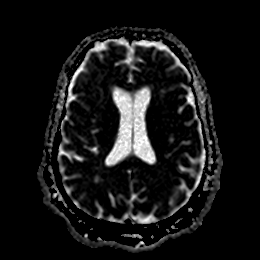
[im 41/52]
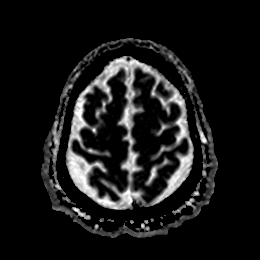
[im 52/52]
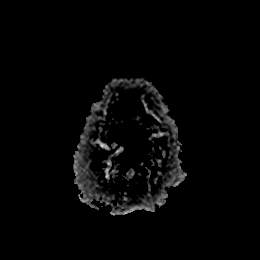

[Series 7: DWI · coronal · 4.0mm · 0.88mm/px · 8 of 68 slices shown (3 of 4)]
[im 1/68]
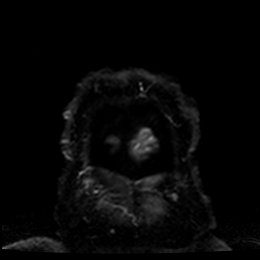
[im 10/68]
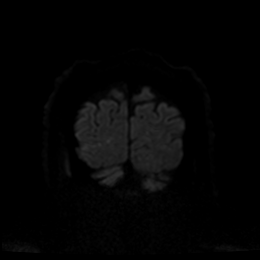
[im 20/68]
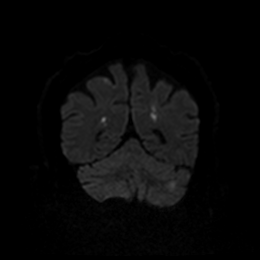
[im 29/68]
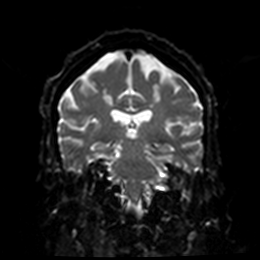
[im 39/68]
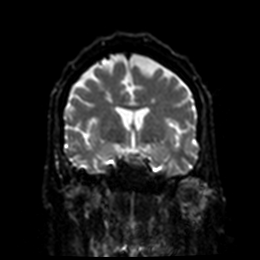
[im 48/68]
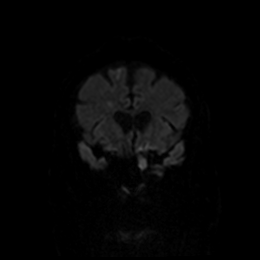
[im 58/68]
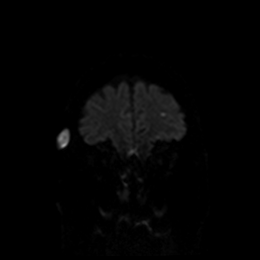
[im 68/68]
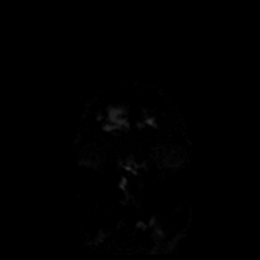

[Series 8: DWI · coronal · 4.0mm · 0.88mm/px · 4 of 34 slices shown (4 of 4)]
[im 1/34]
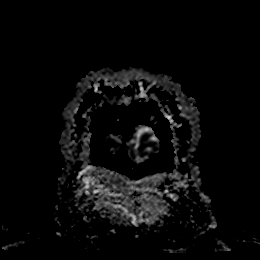
[im 12/34]
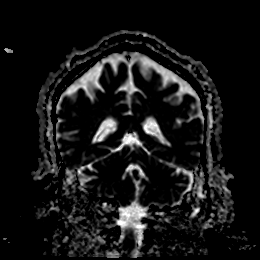
[im 23/34]
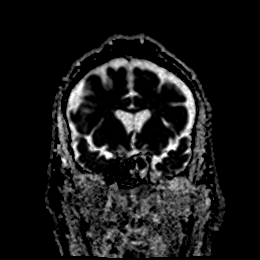
[im 34/34]
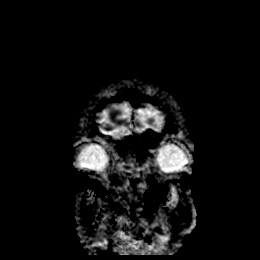

[Series 9: T1 · sagittal · 5.0mm · 0.75mm/px · 3 of 25 slices shown]
[im 1/25]
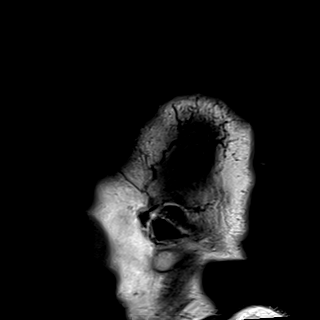
[im 13/25]
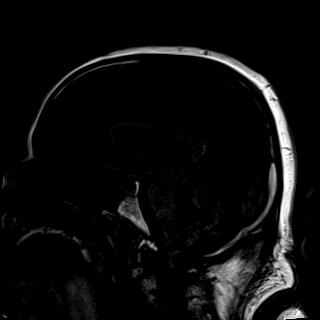
[im 25/25]
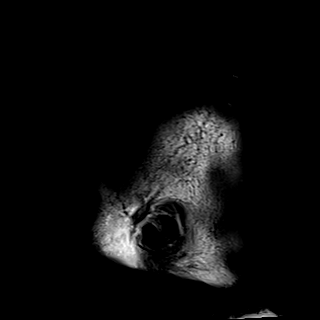

[Series 10: FLAIR · axial · 5.0mm · 0.45mm/px · z∈[+75,+229]mm · 3 of 27 slices shown]
[im 1/27]
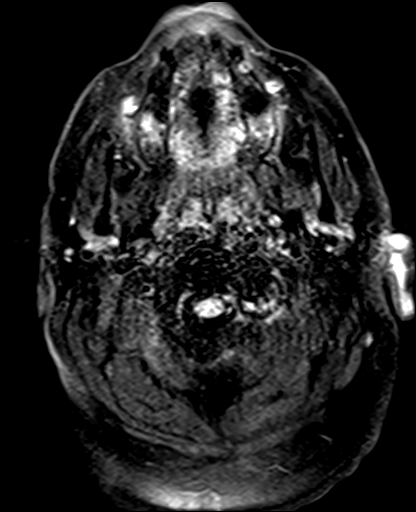
[im 14/27]
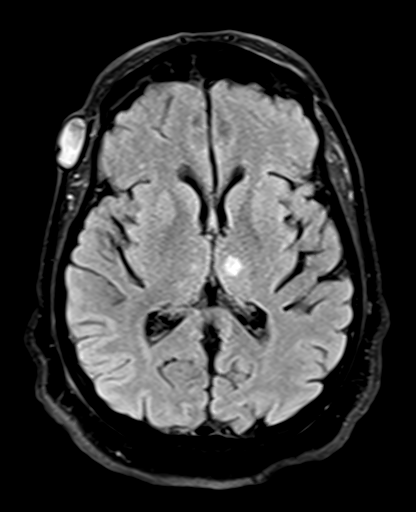
[im 27/27]
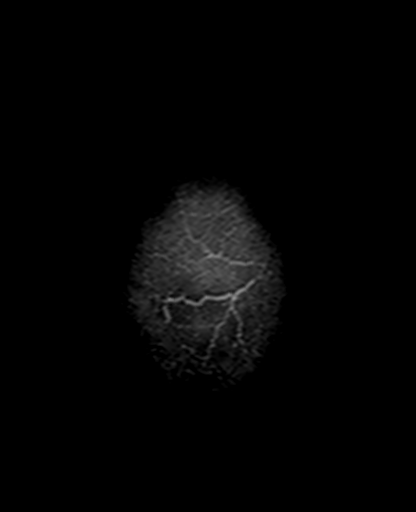

[Series 11: T2 · axial · 5.0mm · 0.72mm/px · z∈[+75,+228]mm · 3 of 27 slices shown (1 of 2)]
[im 1/27]
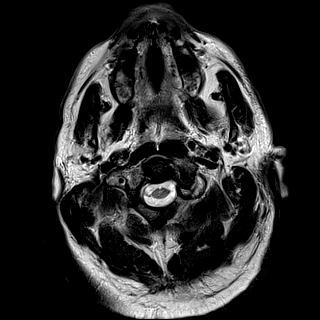
[im 14/27]
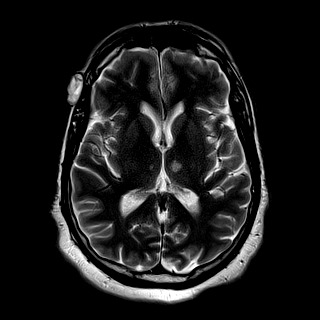
[im 27/27]
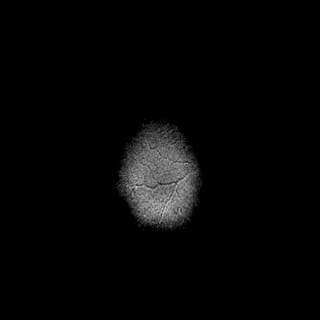

[Series 13: T2 · coronal · 5.0mm · 0.34mm/px · 3 of 29 slices shown (2 of 2)]
[im 1/29]
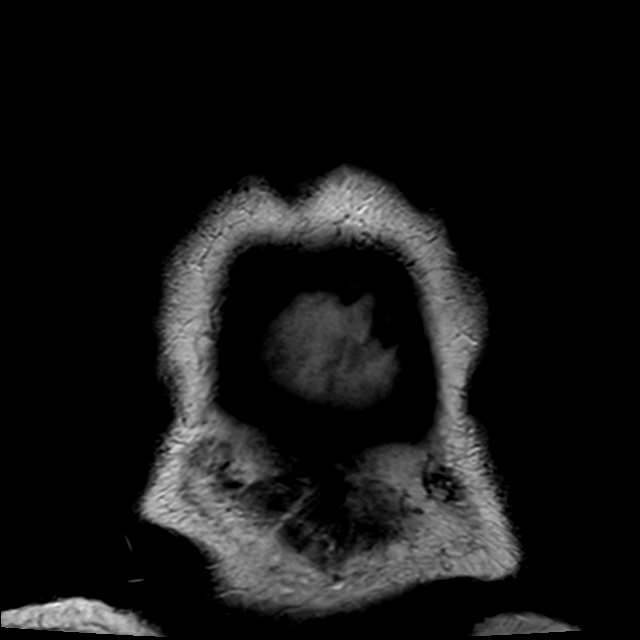
[im 15/29]
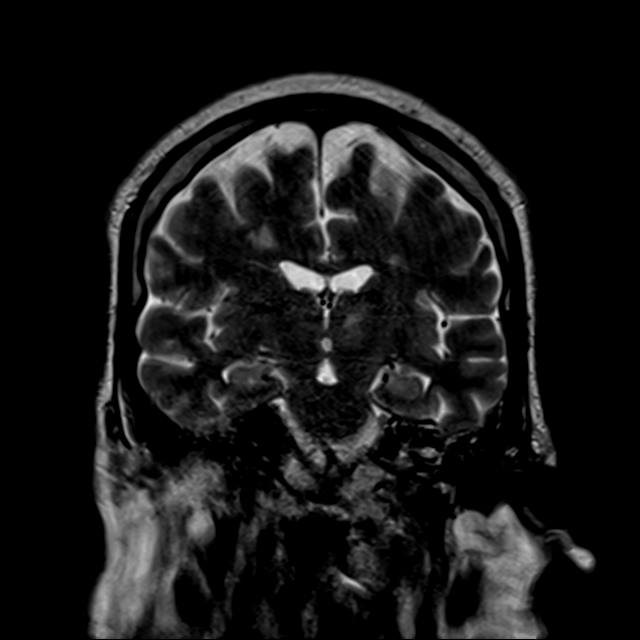
[im 29/29]
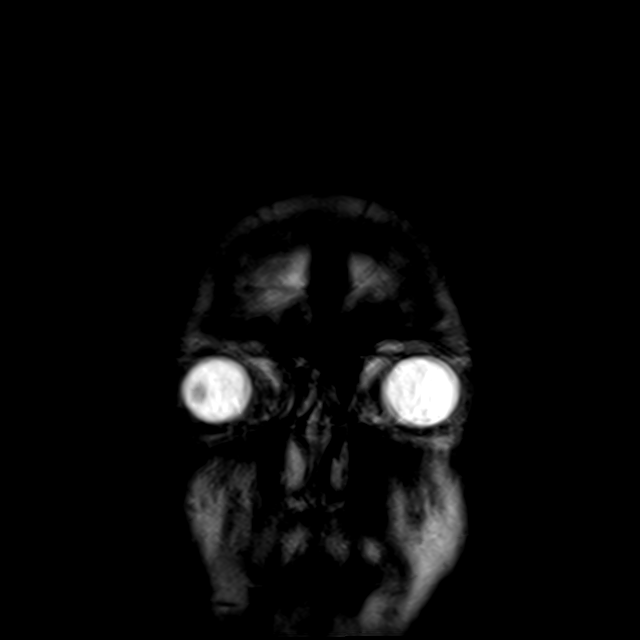

[41 of 48 positions shown; findings below may reference images not displayed]

FINDINGS: MRI HEAD FINDINGS

Brain: Diffusion imaging shows subacute infarctions within the
cerebellar hemispheres. Subacute infarction of the left thalamus as
seen previously. Punctate acute/subacute infarction in the right
thalamus. Numerous acute/subacute infarctions seen affecting both
cerebral hemispheres in a front to back distribution typical of
watershed infarctions. No large confluent infarction. No evidence of
hemorrhage or swelling. No hydrocephalus or extra-axial collection.

Vascular: Major vessels at the base of the brain show flow.

Skull and upper cervical spine: Negative

Sinuses/Orbits: Clear/normal

Other: Sebaceous cyst of the right frontal scalp. Bilateral mastoid
effusions.

MRA HEAD FINDINGS

Both internal carotid arteries are widely patent into the brain. No
siphon stenosis. The anterior and middle cerebral vessels are patent
without proximal stenosis, aneurysm or vascular malformation.

Both vertebral arteries are widely patent to the basilar. No basilar
stenosis. Posterior circulation branch vessels appear normal.

MRA NECK FINDINGS

Both common carotid arteries are widely patent to the bifurcation.
Both carotid bifurcations appear widely patent. Vertebral artery
origins are poorly seen using this technique, but the vessels appear
normal beyond the origins, through the cervical region to the
basilar.
IMPRESSION: Multiple acute/subacute infarctions noted within the cerebellum,
thalami (left larger than right, and both cerebral hemispheres
running from front to back in a watershed distribution. Most of
these were probably acute on the study of 12/16/2020. I cannot
establish any definite new insult since the previous CT exam.

MR angiography of the neck vessels without contrast does not show
any abnormal finding in the neck. Detail is somewhat limited without
contrast enhanced technique.

MR angiography of the intracranial vessels is normal.

## 2022-04-23 ENCOUNTER — Other Ambulatory Visit: Payer: Self-pay | Admitting: *Deleted

## 2022-04-23 DIAGNOSIS — I70222 Atherosclerosis of native arteries of extremities with rest pain, left leg: Secondary | ICD-10-CM

## 2022-04-23 DIAGNOSIS — I70229 Atherosclerosis of native arteries of extremities with rest pain, unspecified extremity: Secondary | ICD-10-CM

## 2022-05-02 ENCOUNTER — Encounter (HOSPITAL_COMMUNITY): Payer: No Typology Code available for payment source

## 2022-05-02 ENCOUNTER — Encounter (HOSPITAL_COMMUNITY): Payer: Self-pay

## 2022-05-02 ENCOUNTER — Ambulatory Visit: Payer: No Typology Code available for payment source | Admitting: Vascular Surgery

## 2022-06-06 ENCOUNTER — Ambulatory Visit (HOSPITAL_COMMUNITY)
Admission: RE | Admit: 2022-06-06 | Discharge: 2022-06-06 | Disposition: A | Discharge status: Home / Self Care | Payer: No Typology Code available for payment source | Source: Ambulatory Visit | Attending: Vascular Surgery | Admitting: Vascular Surgery

## 2022-06-06 ENCOUNTER — Ambulatory Visit (INDEPENDENT_AMBULATORY_CARE_PROVIDER_SITE_OTHER)
Admission: RE | Admit: 2022-06-06 | Discharge: 2022-06-06 | Disposition: A | Discharge status: Home / Self Care | Payer: No Typology Code available for payment source | Source: Ambulatory Visit | Attending: Vascular Surgery | Admitting: Vascular Surgery

## 2022-06-06 ENCOUNTER — Ambulatory Visit (INDEPENDENT_AMBULATORY_CARE_PROVIDER_SITE_OTHER): Payer: No Typology Code available for payment source | Admitting: Physician Assistant

## 2022-06-06 VITALS — BP 160/94 | HR 74 | Temp 97.3°F | Resp 20 | Ht 72.0 in

## 2022-06-06 DIAGNOSIS — I70229 Atherosclerosis of native arteries of extremities with rest pain, unspecified extremity: Secondary | ICD-10-CM | POA: Insufficient documentation

## 2022-06-06 DIAGNOSIS — I70222 Atherosclerosis of native arteries of extremities with rest pain, left leg: Secondary | ICD-10-CM

## 2022-06-06 DIAGNOSIS — M25569 Pain in unspecified knee: Secondary | ICD-10-CM

## 2022-06-06 LAB — VAS US ABI WITH/WO TBI
Left ABI: 1.19
Right ABI: 1.04

## 2022-06-06 MED ORDER — CEPHALEXIN 500 MG PO CAPS: 500.0000 mg | ORAL_CAPSULE | Freq: Three times a day (TID) | ORAL | 1 refills | Status: AC

## 2022-06-06 NOTE — Progress Notes (Signed)
[facility] [place] HISTORY AND PHYSICAL   History of Present Illness:  Patient is a 62 y.o. year old male who presents for evaluation of PAD.  Has a has a history of slowly healing heel wound that has been present since August of of 2023.  The left heel has fully healed.    He has an anterior and posterior lower leg of unknown cause.  He states it has been there for about a month.  He denise fever and chills. He denise claudication, rest pain or pain at the new wounds.    Pt was underwent angiogram on 10/22/2021 by Dr. Trula Slade with stenting of the left SFA.    The pt is on a statin for cholesterol management.    The pt is on an aspirin.    Other AC:  Plavix The pt is on CCB, ACEI, HCTZ, clonidine for hypertension.  The pt does not have diabetes. Tobacco hx:  former   Past Medical History:  Diagnosis Date   Anxiety    Arthritis    Bipolar 1 disorder, depressed ([place])    Bipolar disorder ([place])    Depression    History of posttraumatic stress disorder (PTSD)    Hypertension    PTSD (post-traumatic stress disorder)    Seizures ([place])    Sleep apnea    Smoker    Stroke [facility])    Tobacco use disorder     Past Surgical History:  Procedure Laterality Date   ABDOMINAL AORTOGRAM W/LOWER EXTREMITY Left 10/22/2021   Procedure: ABDOMINAL AORTOGRAM W/LOWER EXTREMITY;  Surgeon: Serafina Mitchell, MD;  Location: [place] [facility];  Service: Cardiovascular;  Laterality: Left;   ANTERIOR LATERAL LUMBAR FUSION 4 LEVELS N/A 12/12/2020   Procedure: Anterior Lateral Interbody Fusion Lumbar One-Two, Lumbar Two-Three, Lumbar Three-Four, Lumbar Four-Five;  Surgeon: Franchot Gallo, MD;  Location: [place];  Service: Urology;  Laterality: N/A;  Anterior Lateral Interbody Fusion Lumbar One-Two, Lumbar Two-Three, Lumbar Three-Four, Lumbar Four-Five   APPLICATION OF INTRAOPERATIVE CT SCAN N/A 12/13/2020   Procedure: APPLICATION OF INTRAOPERATIVE CT SCAN;  Surgeon: Vallarie Mare, MD;   Location: [facility] OR;  Service: Neurosurgery;  Laterality: N/A;   APPLICATION OF WOUND VAC N/A 01/17/2021   Procedure: APPLICATION OF WOUND VAC;  Surgeon: Vallarie Mare, MD;  Location: [place];  Service: Neurosurgery;  Laterality: N/A;   APPLICATION OF WOUND VAC N/A 02/26/2021   Procedure: APPLICATION OF WOUND VAC;  Surgeon: Vallarie Mare, MD;  Location: [place];  Service: Neurosurgery;  Laterality: N/A;   CYSTOSCOPY  12/12/2020   Procedure: CYSTOSCOPY, URETHRAL DILATION, DIFFICULT FOLEY INSERTION;  Surgeon: Franchot Gallo, MD;  Location: [place];  Service: Urology;;   IR FLUORO GUIDE CV LINE RIGHT  12/21/2020   IR IVC FILTER PLMT / S&I /IMG GUID/MOD SED  12/16/2020   IR REMOVAL TUN CV CATH W/O FL  12/31/2020   IR US GUIDE VASC ACCESS RIGHT  12/21/2020   LUMBAR WOUND DEBRIDEMENT N/A 02/26/2021   Procedure: LUMBAR WOUND DEBRIDEMENT;  Surgeon: Vallarie Mare, MD;  Location: [place];  Service: Neurosurgery;  Laterality: N/A;   PERIPHERAL VASCULAR INTERVENTION Left 10/22/2021   Procedure: PERIPHERAL VASCULAR INTERVENTION;  Surgeon: Serafina Mitchell, MD;  Location: [place] [facility];  Service: Cardiovascular;  Laterality: Left;  SFA   POSTERIOR LUMBAR FUSION 4 LEVEL N/A 12/13/2020   Procedure: Thoracic Ten - ILIAC FUSION, L5-S1 TLIF, Posterior osteotomies for deformity correction and decompression L2-3, L3-4, L4-5;  Surgeon:  Vallarie Mare, MD;  Location: [place];  Service: Neurosurgery;  Laterality: N/A;   WOUND EXPLORATION N/A 01/17/2021   Procedure: THORACOLUMBAR WOUND EXPLORATION;  Surgeon: Vallarie Mare, MD;  Location: [place];  Service: Neurosurgery;  Laterality: N/A;    ROS:   General:  No weight loss, Fever, chills  HEENT: No recent headaches, no nasal bleeding, no visual changes, no sore throat  Neurologic: No dizziness, blackouts, seizures. No recent symptoms of stroke or mini- stroke. No recent episodes of slurred speech, or temporary blindness.  Cardiac: No recent episodes of chest  pain/pressure, no shortness of breath at rest.  No shortness of breath with exertion.  Denies history of atrial fibrillation or irregular heartbeat  Vascular: No history of rest pain in feet.  No history of claudication.   history of non-healing ulcer, No history of DVT   Pulmonary: No home oxygen, no productive cough, no hemoptysis,  No asthma or wheezing  Musculoskeletal:  '[ ]'$  Arthritis, '[ ]'$  Low back pain,  '[ ]'$  Joint pain  Hematologic:No history of hypercoagulable state.  No history of easy bleeding.  No history of anemia  Gastrointestinal: No hematochezia or melena,  No gastroesophageal reflux, no trouble swallowing  Urinary: '[ ]'$  chronic Kidney disease, '[ ]'$  on HD - '[ ]'$  MWF or '[ ]'$  TTHS, '[ ]'$  Burning with urination, '[ ]'$  Frequent urination, '[ ]'$  Difficulty urinating;   Skin: No rashes  Psychological: No history of anxiety,  No history of depression  Social History Social History   Tobacco Use   Smoking status: Former    Packs/day: 0.50    Years: 20.00    Total pack years: 10.00    Types: Cigarettes    Passive exposure: Never   Smokeless tobacco: Never  Vaping Use   Vaping Use: Never used  Substance Use Topics   Alcohol use: Yes    Alcohol/week: 2.0 standard drinks of alcohol    Types: 2 Shots of liquor per week   Drug use: Yes    Types: Marijuana    Comment: pt stated he quit 2 weeks ago 08/10/12, pt reports doing marijuana on 11/13/12    Family History Family History  Problem Relation Age of Onset   Seizures Mother    Breast cancer Mother 48   Hypertension Brother     Allergies  No Known Allergies   Current Outpatient Medications  Medication Sig Dispense Refill   acetaminophen (TYLENOL) 500 MG tablet Take 2 tablets (1,000 mg total) by mouth every 6 (six) hours as needed for mild pain. 30 tablet 0   amLODipine (NORVASC) 10 MG tablet Take 10 mg by mouth every morning.     aspirin 81 MG chewable tablet Chew 1 tablet (81 mg total) by mouth daily. 60 tablet 3    baclofen (LIORESAL) 10 MG tablet Take 10 mg by mouth 2 (two) times daily.     cephALEXin (KEFLEX) 500 MG capsule Take 1 capsule (500 mg total) by mouth 3 (three) times daily. 21 capsule 1   Cholecalciferol (VITAMIN D3) 50 MCG (2000 UT) capsule Take 2,000 Units by mouth 2 (two) times daily.     cloNIDine (CATAPRES) 0.1 MG tablet Take 0.1 mg by mouth every 8 (eight) hours as needed. If systolic blood pressure greater than 160     clopidogrel (PLAVIX) 75 MG tablet Take 1 tablet (75 mg total) by mouth daily. 30 tablet 11   cyclobenzaprine (FLEXERIL) 10 MG tablet Take 1 tablet (10 mg total) by  mouth 3 (three) times daily as needed for muscle spasms. (Patient taking differently: Take 10 mg by mouth every 8 (eight) hours as needed for muscle spasms.) 60 tablet 0   docusate sodium (COLACE) 100 MG capsule Take 1 capsule (100 mg total) by mouth 2 (two) times daily as needed for mild constipation. (Patient taking differently: Take 100 mg by mouth every 12 (twelve) hours as needed (for constipation).) 60 capsule 2   Ensure (ENSURE) Take 237 mLs by mouth 3 (three) times daily.     ferrous sulfate 325 (65 FE) MG tablet Take 325 mg by mouth daily with breakfast.     folic acid (FOLVITE) 1 MG tablet Take 1 tablet by mouth daily.     gabapentin (NEURONTIN) 300 MG capsule Take 1 capsule (300 mg total) by mouth 2 (two) times daily. (Patient taking differently: Take 600 mg by mouth 2 (two) times daily.)     hydrOXYzine (VISTARIL) 50 MG capsule Take 50 mg by mouth in the morning and at bedtime.     KEPPRA 750 MG tablet Take 1,500 mg by mouth 2 (two) times daily.     LAMICTAL 100 MG tablet Take 100 mg by mouth 2 (two) times daily.     lisinopril-hydrochlorothiazide (ZESTORETIC) 20-12.5 MG tablet Take 1 tablet by mouth daily.     Magnesium 400 MG TABS Take 400 mg by mouth daily. 14 tablet 0   pantoprazole (PROTONIX) 40 MG tablet Take 1 tablet (40 mg total) by mouth at bedtime. 50 tablet 2   PRESCRIPTION MEDICATION Apply 1  application. topically 3 (three) times a week. Add 1 scoop ('500mg'$ ) of compounded powder and 10 pumps of Bassa-Gel to mixing jar, mix together until mixed completely. Apply the gel to the affected area(s) daily or with dressing changes as directed POW Gentamicin-Vancomycin-Itraconazole 33-25-5% Powder Sig: Add 1 scoop ('500mg'$ ) of compounded powder and 10 pumps of Bassa-Gel to mixing jar, mix together until mixed completely. Apply the gel to the affected area(s) daily or with dressing changes as directed     QUEtiapine (SEROQUEL) 200 MG tablet Take 200 mg by mouth at bedtime.     rosuvastatin (CRESTOR) 10 MG tablet Take 1 tablet (10 mg total) by mouth daily. 30 tablet 11   senna-docusate (SENOKOT-S) 8.6-50 MG tablet Take 2 tablets by mouth at bedtime.     sertraline (ZOLOFT) 100 MG tablet Take 100 mg by mouth daily after breakfast.     sodium bicarbonate 650 MG tablet Take 650 mg by mouth 3 (three) times daily.     vitamin B-12 (CYANOCOBALAMIN) 500 MCG tablet Take 500 mcg by mouth daily.     zinc gluconate 50 MG tablet Take 50 mg by mouth daily.     metroNIDAZOLE (FLAGYL) 500 MG tablet Take 500 mg by mouth 3 (three) times daily. (Patient not taking: Reported on 06/06/2022)     No current facility-administered medications for this visit.    Physical Examination  Vitals:   06/06/22 0948  BP: (!) 160/94  Pulse: 74  Resp: 20  Temp: (!) 97.3 F (36.3 C)  TempSrc: Oral  SpO2: 100%  Height: 6' (1.829 m)    Body mass index is 33.68 kg/m.  General:  Alert and oriented, no acute distress HEENT: Normal Neck: No bruit or JVD Pulmonary: Clear to auscultation bilaterally Cardiac: Regular Rate and Rhythm without murmur Abdomen: Soft, non-tender, non-distended, no mass, no scars Skin: No rash       Extremity  Musculoskeletal: No deformity or edema  Neurologic: Upper and lower extremity motor 5/5 and symmetric  DATA:  ABI Findings:   +---------+------------------+-----+---------+--------+  Right   Rt Pressure (mmHg)IndexWaveform Comment   +---------+------------------+-----+---------+--------+  Brachial 167                                       +---------+------------------+-----+---------+--------+  PTA     173               1.04 triphasic          +---------+------------------+-----+---------+--------+  DP      160               0.96 triphasic          +---------+------------------+-----+---------+--------+  Great Toe130               0.78                    +---------+------------------+-----+---------+--------+   +---------+------------------+-----+----------+----------------------------  ----+  Left    Lt Pressure (mmHg)IndexWaveform  Comment                            +---------+------------------+-----+----------+----------------------------  ----+  Brachial 162                                                                 +---------+------------------+-----+----------+----------------------------  ----+  PTA     199               1.19 triphasic                                    +---------+------------------+-----+----------+----------------------------  ----+  DP      175               1.05 monophasicDifficulty insonating  secondary                                             to edema                           +---------+------------------+-----+----------+----------------------------  ----+  Great Toe147               0.88                                              +---------+------------------+-----+----------+----------------------------  ----+   +-------+-----------+-----------+------------+------------+  ABI/TBIToday's ABIToday's TBIPrevious ABIPrevious TBI  +-------+-----------+-----------+------------+------------+  Right 1.04       0.78       0.91        0.68           +-------+-----------+-----------+------------+------------+  Left  1.19       0.88       1.05        0.98          +-------+-----------+-----------+------------+------------+  Summary:  Right: Resting right ankle-brachial index is within normal range. The  right toe-brachial index is normal.   Left: Resting left ankle-brachial index is within normal range. The left  toe-brachial index is normal.    +-----------+--------+-----+--------+-------------------+--------+  LEFT      PSV cm/sRatioStenosisWaveform           Comments  +-----------+--------+-----+--------+-------------------+--------+  CFA Distal 118                  Hyperemic triphasic          +-----------+--------+-----+--------+-------------------+--------+  DFA       71                   Hyperemic triphasic          +-----------+--------+-----+--------+-------------------+--------+  ATA Distal 44                   monophasic                   +-----------+--------+-----+--------+-------------------+--------+  PTA Distal 106                  Hyperemic triphasic          +-----------+--------+-----+--------+-------------------+--------+  PERO Distal66                   monophasic                   +-----------+--------+-----+--------+-------------------+--------+     Left Stent(s):  +---------------+---++-------------------++  Prox to Stent  169Hyperemic triphasic  +---------------+---++-------------------++  Proximal Stent 37moophasic           +---------------+---++-------------------++  Mid Stent      100Hyperemic triphasic  +---------------+---++-------------------++  Distal Stent   115Hyperemic triphasic  +---------------+---++-------------------++  Distal to Stent137Hyperemic triphasic  +---------------+---++-------------------++    Summary:  Left: Patent stent with no evidence of stenosis in the superficial femoral   artery. Sonographic findings of enlarged left inguinal lymph node  measuring 4.46cm.    ASSESSMENT/PLAN:   62y.o. male here for follow up for PAD with hx of angiogram with stent to the left SFA on 10/22/2021 by Dr. BTrula Slade  ABI's  Are stable with slight improvement on the right LE.  The duplex demonstrates  Hyperemic flow with triphasic wave forms.  Stent is patent.  There is a noted enlarged left inguinal lymph node measuring 4.46cm.    I will place him on Keflex for 7 days, dry dressing to the lower leg, soap and water washing, elevation to assist with the edema.  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  He is managed on ASA and  Plavix.  I reviewed the MRI he had on 02/26/21 and did not see evidence of IVC.  This information was not provided by the patient or in his [facility] chart.  He is not a good historian. He will f/u in the next few weeks for DVT/reflux study of the left LE with wound check.        ERoxy HorsemanPA-C [facility] GBrandermillOffice: [phone] MD on call CDonzetta Matters

## 2022-06-11 ENCOUNTER — Other Ambulatory Visit: Payer: Self-pay

## 2022-06-11 DIAGNOSIS — M25569 Pain in unspecified knee: Secondary | ICD-10-CM

## 2022-06-27 ENCOUNTER — Ambulatory Visit (INDEPENDENT_AMBULATORY_CARE_PROVIDER_SITE_OTHER): Payer: No Typology Code available for payment source | Admitting: Physician Assistant

## 2022-06-27 ENCOUNTER — Ambulatory Visit (HOSPITAL_COMMUNITY)
Admission: RE | Admit: 2022-06-27 | Discharge: 2022-06-27 | Disposition: A | Discharge status: Home / Self Care | Payer: No Typology Code available for payment source | Source: Ambulatory Visit | Attending: Vascular Surgery | Admitting: Vascular Surgery

## 2022-06-27 VITALS — BP 152/97 | HR 89 | Temp 98.4°F | Resp 20 | Ht 72.0 in | Wt 270.2 lb

## 2022-06-27 DIAGNOSIS — S81802D Unspecified open wound, left lower leg, subsequent encounter: Secondary | ICD-10-CM

## 2022-06-27 DIAGNOSIS — I872 Venous insufficiency (chronic) (peripheral): Secondary | ICD-10-CM

## 2022-06-27 DIAGNOSIS — M25569 Pain in unspecified knee: Secondary | ICD-10-CM | POA: Insufficient documentation

## 2022-06-27 NOTE — Progress Notes (Signed)
Office Note     CC:  follow up Requesting Provider:  Reubin Milan, MD  HPI: Keith Dixon is a 62 y.o. (1961/02/25) male who presents for evaluation of left leg wounds.  Wounds have been present on his left shin and posterior calf for the past 6 months.  He underwent stenting of the left SFA by Dr. Trula Slade on 10/22/2021.  Patient states he has been working on elevation and compression of the left foot and believes the wounds are now healing.  He denies claudication, rest pain, or any further tissue loss.  He has a home health aide who attends to his wound on a daily basis.   Past Medical History:  Diagnosis Date   Anxiety    Arthritis    Bipolar 1 disorder, depressed ([place])    Bipolar disorder ([place])    Depression    History of posttraumatic stress disorder (PTSD)    Hypertension    PTSD (post-traumatic stress disorder)    Seizures (HCC)    Sleep apnea    Smoker    Stroke [facility])    Tobacco use disorder     Past Surgical History:  Procedure Laterality Date   ABDOMINAL AORTOGRAM W/LOWER EXTREMITY Left 10/22/2021   Procedure: ABDOMINAL AORTOGRAM W/LOWER EXTREMITY;  Surgeon: Serafina Mitchell, MD;  Location: [place] [facility];  Service: Cardiovascular;  Laterality: Left;   ANTERIOR LATERAL LUMBAR FUSION 4 LEVELS N/A 12/12/2020   Procedure: Anterior Lateral Interbody Fusion Lumbar One-Two, Lumbar Two-Three, Lumbar Three-Four, Lumbar Four-Five;  Surgeon: Franchot Gallo, MD;  Location: [place];  Service: Urology;  Laterality: N/A;  Anterior Lateral Interbody Fusion Lumbar One-Two, Lumbar Two-Three, Lumbar Three-Four, Lumbar Four-Five   APPLICATION OF INTRAOPERATIVE CT SCAN N/A 12/13/2020   Procedure: APPLICATION OF INTRAOPERATIVE CT SCAN;  Surgeon: Vallarie Mare, MD;  Location: [facility] OR;  Service: Neurosurgery;  Laterality: N/A;   APPLICATION OF WOUND VAC N/A 01/17/2021   Procedure: APPLICATION OF WOUND VAC;  Surgeon: Vallarie Mare, MD;  Location: [place];  Service: Neurosurgery;   Laterality: N/A;   APPLICATION OF WOUND VAC N/A 02/26/2021   Procedure: APPLICATION OF WOUND VAC;  Surgeon: Vallarie Mare, MD;  Location: [place];  Service: Neurosurgery;  Laterality: N/A;   CYSTOSCOPY  12/12/2020   Procedure: CYSTOSCOPY, URETHRAL DILATION, DIFFICULT FOLEY INSERTION;  Surgeon: Franchot Gallo, MD;  Location: [place];  Service: Urology;;   IR FLUORO GUIDE CV LINE RIGHT  12/21/2020   IR IVC FILTER PLMT / S&I /IMG GUID/MOD SED  12/16/2020   IR REMOVAL TUN CV CATH W/O FL  12/31/2020   IR US GUIDE VASC ACCESS RIGHT  12/21/2020   LUMBAR WOUND DEBRIDEMENT N/A 02/26/2021   Procedure: LUMBAR WOUND DEBRIDEMENT;  Surgeon: Vallarie Mare, MD;  Location: [place];  Service: Neurosurgery;  Laterality: N/A;   PERIPHERAL VASCULAR INTERVENTION Left 10/22/2021   Procedure: PERIPHERAL VASCULAR INTERVENTION;  Surgeon: Serafina Mitchell, MD;  Location: [place] [facility];  Service: Cardiovascular;  Laterality: Left;  SFA   POSTERIOR LUMBAR FUSION 4 LEVEL N/A 12/13/2020   Procedure: Thoracic Ten - ILIAC FUSION, L5-S1 TLIF, Posterior osteotomies for deformity correction and decompression L2-3, L3-4, L4-5;  Surgeon: Vallarie Mare, MD;  Location: [place];  Service: Neurosurgery;  Laterality: N/A;   WOUND EXPLORATION N/A 01/17/2021   Procedure: THORACOLUMBAR WOUND EXPLORATION;  Surgeon: Vallarie Mare, MD;  Location: [place];  Service: Neurosurgery;  Laterality: N/A;    Social History   Socioeconomic History  Marital status: Significant Other    Spouse name: Not on file   Number of children: 1   Years of education: Not on file   Highest education level: Not on file  Occupational History    Employer: [employer]  Tobacco Use   Smoking status: Every Day    Packs/day: 0.50    Years: 20.00    Total pack years: 10.00    Types: Cigarettes    Passive exposure: Never   Smokeless tobacco: Never  Vaping Use   Vaping Use: Never used  Substance and Sexual Activity   Alcohol use: Yes     Alcohol/week: 2.0 standard drinks of alcohol    Types: 2 Shots of liquor per week   Drug use: Yes    Types: Marijuana    Comment: pt stated he quit 2 weeks ago 08/10/12, pt reports doing marijuana on 11/13/12   Sexual activity: Yes  Other Topics Concern   Not on file  Social History Narrative   ** Merged History Encounter **       Social Determinants of Health   Financial Resource Strain: Not on file  Food Insecurity: Not on file  Transportation Needs: Not on file  Physical Activity: Not on file  Stress: Not on file  Social Connections: Not on file  Intimate Partner Violence: Not on file    Family History  Problem Relation Age of Onset   Seizures Mother    Breast cancer Mother 56   Hypertension Brother     Current Outpatient Medications  Medication Sig Dispense Refill   acetaminophen (TYLENOL) 500 MG tablet Take 2 tablets (1,000 mg total) by mouth every 6 (six) hours as needed for mild pain. 30 tablet 0   amLODipine (NORVASC) 10 MG tablet Take 10 mg by mouth every morning.     aspirin 81 MG chewable tablet Chew 1 tablet (81 mg total) by mouth daily. 60 tablet 3   baclofen (LIORESAL) 10 MG tablet Take 10 mg by mouth 2 (two) times daily.     cephALEXin (KEFLEX) 500 MG capsule Take 1 capsule (500 mg total) by mouth 3 (three) times daily. 21 capsule 1   Cholecalciferol (VITAMIN D3) 50 MCG (2000 UT) capsule Take 2,000 Units by mouth 2 (two) times daily.     cloNIDine (CATAPRES) 0.1 MG tablet Take 0.1 mg by mouth every 8 (eight) hours as needed. If systolic blood pressure greater than 160     clopidogrel (PLAVIX) 75 MG tablet Take 1 tablet (75 mg total) by mouth daily. 30 tablet 11   cyclobenzaprine (FLEXERIL) 10 MG tablet Take 1 tablet (10 mg total) by mouth 3 (three) times daily as needed for muscle spasms. (Patient taking differently: Take 10 mg by mouth every 8 (eight) hours as needed for muscle spasms.) 60 tablet 0   docusate sodium (COLACE) 100 MG capsule Take 1 capsule (100 mg  total) by mouth 2 (two) times daily as needed for mild constipation. (Patient taking differently: Take 100 mg by mouth every 12 (twelve) hours as needed (for constipation).) 60 capsule 2   Ensure (ENSURE) Take 237 mLs by mouth 3 (three) times daily.     ferrous sulfate 325 (65 FE) MG tablet Take 325 mg by mouth daily with breakfast.     folic acid (FOLVITE) 1 MG tablet Take 1 tablet by mouth daily.     gabapentin (NEURONTIN) 300 MG capsule Take 1 capsule (300 mg total) by mouth 2 (two) times daily. (Patient taking differently:  Take 600 mg by mouth 2 (two) times daily.)     hydrOXYzine (VISTARIL) 50 MG capsule Take 50 mg by mouth in the morning and at bedtime.     KEPPRA 750 MG tablet Take 1,500 mg by mouth 2 (two) times daily.     LAMICTAL 100 MG tablet Take 100 mg by mouth 2 (two) times daily.     lisinopril-hydrochlorothiazide (ZESTORETIC) 20-12.5 MG tablet Take 1 tablet by mouth daily.     Magnesium 400 MG TABS Take 400 mg by mouth daily. 14 tablet 0   metroNIDAZOLE (FLAGYL) 500 MG tablet Take 500 mg by mouth 3 (three) times daily.     pantoprazole (PROTONIX) 40 MG tablet Take 1 tablet (40 mg total) by mouth at bedtime. 50 tablet 2   PRESCRIPTION MEDICATION Apply 1 application. topically 3 (three) times a week. Add 1 scoop (538m) of compounded powder and 10 pumps of Bassa-Gel to mixing jar, mix together until mixed completely. Apply the gel to the affected area(s) daily or with dressing changes as directed POW Gentamicin-Vancomycin-Itraconazole 33-25-5% Powder Sig: Add 1 scoop (5075m of compounded powder and 10 pumps of Bassa-Gel to mixing jar, mix together until mixed completely. Apply the gel to the affected area(s) daily or with dressing changes as directed     QUEtiapine (SEROQUEL) 200 MG tablet Take 200 mg by mouth at bedtime.     rosuvastatin (CRESTOR) 10 MG tablet Take 1 tablet (10 mg total) by mouth daily. 30 tablet 11   senna-docusate (SENOKOT-S) 8.6-50 MG tablet Take 2 tablets by  mouth at bedtime.     sertraline (ZOLOFT) 100 MG tablet Take 100 mg by mouth daily after breakfast.     sodium bicarbonate 650 MG tablet Take 650 mg by mouth 3 (three) times daily.     vitamin B-12 (CYANOCOBALAMIN) 500 MCG tablet Take 500 mcg by mouth daily.     zinc gluconate 50 MG tablet Take 50 mg by mouth daily.     No current facility-administered medications for this visit.    No Known Allergies   REVIEW OF SYSTEMS:   [X]$  denotes positive finding, [ ]$  denotes negative finding Cardiac  Comments:  Chest pain or chest pressure:    Shortness of breath upon exertion:    Short of breath when lying flat:    Irregular heart rhythm:        Vascular    Pain in calf, thigh, or hip brought on by ambulation:    Pain in feet at night that wakes you up from your sleep:     Blood clot in your veins:    Leg swelling:         Pulmonary    Oxygen at home:    Productive cough:     Wheezing:         Neurologic    Sudden weakness in arms or legs:     Sudden numbness in arms or legs:     Sudden onset of difficulty speaking or slurred speech:    Temporary loss of vision in one eye:     Problems with dizziness:         Gastrointestinal    Blood in stool:     Vomited blood:         Genitourinary    Burning when urinating:     Blood in urine:        Psychiatric    Major depression:         Hematologic  Bleeding problems:    Problems with blood clotting too easily:        Skin    Rashes or ulcers:        Constitutional    Fever or chills:      PHYSICAL EXAMINATION:  Vitals:   06/27/22 1510  BP: (!) 152/97  Pulse: 89  Resp: 20  Temp: 98.4 F (36.9 C)  TempSrc: Temporal  SpO2: 100%  Weight: 270 lb 3.2 oz (122.6 kg)  Height: 6' (1.829 m)    General:  WDWN in NAD; vital signs documented above Gait: Not observed HENT: WNL, normocephalic Pulmonary: normal non-labored breathing , without Rales, rhonchi,  wheezing Cardiac: regular HR Abdomen: soft, NT, no  masses Skin: without rashes Vascular Exam/Pulses: Unable to palpate left pedal pulses however I believe this is secondary to edema; he has brisk PT and AT Doppler signals Extremities: Wounds of the left leg pictured below Musculoskeletal: no muscle wasting or atrophy  Neurologic: A&O X 3;  No focal weakness or paresthesias are detected Psychiatric:  The pt has Normal affect.       Non-Invasive Vascular Imaging:   Left lower extremity venous reflux study demonstrating common femoral vein insufficiency as well as popliteal vein insufficiency  Left GSV incompetent only at the proximal thigh    ASSESSMENT/PLAN:: 62 y.o. male here for follow up for evaluation of venous insufficiency due to ongoing slow to heal wounds of the left lower extremity  -Patient believes the wounds are healing well since using compression and elevation.  Venous reflux study demonstrates deep venous insufficiency.  He does have an incompetent GSV at the level of the proximal thigh however he would not be a candidate for laser ablation.  He should continue his current wound care which will involve elevation and compression.  We will see him back in 4 to 6 weeks for wound check.  He will be placed in an Unna boot if his wounds worsen or fail to improve.  He knows to call/return office sooner with any questions or concerns.   Dagoberto Ligas, PA-C [facility] [phone]  Clinic MD:   Virl Cagey

## 2022-07-14 ENCOUNTER — Telehealth: Payer: Self-pay

## 2022-07-14 NOTE — Telephone Encounter (Signed)
Pt called with c/o pain from non-healing wound. He states the wound and swelling were improving until he completed a round of antibiotics and shortly after the pain and swelling returned. Pt could not remember when he started the antibiotics or the name of them. Pt has been made a wound check f/u tomorrow with APP and is aware of this appt.

## 2022-07-15 ENCOUNTER — Other Ambulatory Visit: Payer: Self-pay

## 2022-07-15 ENCOUNTER — Ambulatory Visit (INDEPENDENT_AMBULATORY_CARE_PROVIDER_SITE_OTHER): Payer: No Typology Code available for payment source | Admitting: Physician Assistant

## 2022-07-15 VITALS — BP 184/103 | HR 73 | Temp 98.4°F | Resp 20 | Ht 72.0 in | Wt 281.3 lb

## 2022-07-15 DIAGNOSIS — I872 Venous insufficiency (chronic) (peripheral): Secondary | ICD-10-CM | POA: Diagnosis not present

## 2022-07-15 DIAGNOSIS — I70229 Atherosclerosis of native arteries of extremities with rest pain, unspecified extremity: Secondary | ICD-10-CM

## 2022-07-15 DIAGNOSIS — M25569 Pain in unspecified knee: Secondary | ICD-10-CM

## 2022-07-15 DIAGNOSIS — S81802D Unspecified open wound, left lower leg, subsequent encounter: Secondary | ICD-10-CM

## 2022-07-15 NOTE — Progress Notes (Signed)
Office Note     CC:  follow up Requesting Provider:  Reubin Milan, MD  HPI: Keith Dixon is a 62 y.o. ([birth year]) male who presents for evaluation of left leg wounds.  He has had the same wound on his left shin and posterior calf over the past 6 months.  He underwent stenting of the left SFA by Dr. Trula Slade on 10/22/2021 which helped heal a left heel wound.  He admittedly has been unable to manage his edema of the left lower extremity.  He has thin clear drainage from both wounds during the day.  He does not wear any compression.  He tries to elevate his leg periodically throughout the day.  He denies any rest pain or tissue loss of his left foot.  He is taking an aspirin and a statin daily.  He recently was active with [facility] home [facility] for wound care of his left heel.   Past Medical History:  Diagnosis Date   Anxiety    Arthritis    Bipolar 1 disorder, depressed ([place])    Bipolar disorder ([place])    Depression    History of posttraumatic stress disorder (PTSD)    Hypertension    PTSD (post-traumatic stress disorder)    Seizures (HCC)    Sleep apnea    Smoker    Stroke [facility])    Tobacco use disorder     Past Surgical History:  Procedure Laterality Date   ABDOMINAL AORTOGRAM W/LOWER EXTREMITY Left 10/22/2021   Procedure: ABDOMINAL AORTOGRAM W/LOWER EXTREMITY;  Surgeon: Serafina Mitchell, MD;  Location: [place] [facility];  Service: Cardiovascular;  Laterality: Left;   ANTERIOR LATERAL LUMBAR FUSION 4 LEVELS N/A 12/12/2020   Procedure: Anterior Lateral Interbody Fusion Lumbar One-Two, Lumbar Two-Three, Lumbar Three-Four, Lumbar Four-Five;  Surgeon: Franchot Gallo, MD;  Location: [place];  Service: Urology;  Laterality: N/A;  Anterior Lateral Interbody Fusion Lumbar One-Two, Lumbar Two-Three, Lumbar Three-Four, Lumbar Four-Five   APPLICATION OF INTRAOPERATIVE CT SCAN N/A 12/13/2020   Procedure: APPLICATION OF INTRAOPERATIVE CT SCAN;  Surgeon: Vallarie Mare, MD;  Location[facility];   Service: Neurosurgery;  Laterality: N/A;   APPLICATION OF WOUND VAC N/A 01/17/2021   Procedure: APPLICATION OF WOUND VAC;  Surgeon: Vallarie Mare, MD;  Location: [place];  Service: Neurosurgery;  Laterality: N/A;   APPLICATION OF WOUND VAC N/A 02/26/2021   Procedure: APPLICATION OF WOUND VAC;  Surgeon: Vallarie Mare, MD;  Location: [place];  Service: Neurosurgery;  Laterality: N/A;   CYSTOSCOPY  12/12/2020   Procedure: CYSTOSCOPY, URETHRAL DILATION, DIFFICULT FOLEY INSERTION;  Surgeon: Franchot Gallo, MD;  Location: [place];  Service: Urology;;   IR FLUORO GUIDE CV LINE RIGHT  12/21/2020   IR IVC FILTER PLMT / S&I /IMG GUID/MOD SED  12/16/2020   IR REMOVAL TUN CV CATH W/O FL  12/31/2020   IR US GUIDE VASC ACCESS RIGHT  12/21/2020   LUMBAR WOUND DEBRIDEMENT N/A 02/26/2021   Procedure: LUMBAR WOUND DEBRIDEMENT;  Surgeon: Vallarie Mare, MD;  Location: [place];  Service: Neurosurgery;  Laterality: N/A;   PERIPHERAL VASCULAR INTERVENTION Left 10/22/2021   Procedure: PERIPHERAL VASCULAR INTERVENTION;  Surgeon: Serafina Mitchell, MD;  Location: [place] [facility];  Service: Cardiovascular;  Laterality: Left;  SFA   POSTERIOR LUMBAR FUSION 4 LEVEL N/A 12/13/2020   Procedure: Thoracic Ten - ILIAC FUSION, L5-S1 TLIF, Posterior osteotomies for deformity correction and decompression L2-3, L3-4, L4-5;  Surgeon: Vallarie Mare, MD;  Location: [place];  Service: Neurosurgery;  Laterality: N/A;   WOUND EXPLORATION N/A 01/17/2021   Procedure: THORACOLUMBAR WOUND EXPLORATION;  Surgeon: Vallarie Mare, MD;  Location: [place];  Service: Neurosurgery;  Laterality: N/A;    Social History   Socioeconomic History   Marital status: Significant Other    Spouse name: Not on file   Number of children: 1   Years of education: Not on file   Highest education level: Not on file  Occupational History    Employer: [employer]  Tobacco Use   Smoking status: Every Day    Packs/day: 0.50    Years: 20.00    Total  pack years: 10.00    Types: Cigarettes    Passive exposure: Never   Smokeless tobacco: Never  Vaping Use   Vaping Use: Never used  Substance and Sexual Activity   Alcohol use: Yes    Alcohol/week: 2.0 standard drinks of alcohol    Types: 2 Shots of liquor per week   Drug use: Yes    Types: Marijuana    Comment: pt stated he quit 2 weeks ago 08/10/12, pt reports doing marijuana on 11/13/12   Sexual activity: Yes  Other Topics Concern   Not on file  Social History Narrative   ** Merged History Encounter **       Social Determinants of Health   Financial Resource Strain: Not on file  Food Insecurity: Not on file  Transportation Needs: Not on file  Physical Activity: Not on file  Stress: Not on file  Social Connections: Not on file  Intimate Partner Violence: Not on file    Family History  Problem Relation Age of Onset   Seizures Mother    Breast cancer Mother 30   Hypertension Brother     Current Outpatient Medications  Medication Sig Dispense Refill   acetaminophen (TYLENOL) 500 MG tablet Take 2 tablets (1,000 mg total) by mouth every 6 (six) hours as needed for mild pain. 30 tablet 0   amLODipine (NORVASC) 10 MG tablet Take 10 mg by mouth every morning.     aspirin 81 MG chewable tablet Chew 1 tablet (81 mg total) by mouth daily. 60 tablet 3   baclofen (LIORESAL) 10 MG tablet Take 10 mg by mouth 2 (two) times daily.     cephALEXin (KEFLEX) 500 MG capsule Take 1 capsule (500 mg total) by mouth 3 (three) times daily. 21 capsule 1   Cholecalciferol (VITAMIN D3) 50 MCG (2000 UT) capsule Take 2,000 Units by mouth 2 (two) times daily.     cloNIDine (CATAPRES) 0.1 MG tablet Take 0.1 mg by mouth every 8 (eight) hours as needed. If systolic blood pressure greater than 160     clopidogrel (PLAVIX) 75 MG tablet Take 1 tablet (75 mg total) by mouth daily. 30 tablet 11   cyclobenzaprine (FLEXERIL) 10 MG tablet Take 1 tablet (10 mg total) by mouth 3 (three) times daily as needed for  muscle spasms. (Patient taking differently: Take 10 mg by mouth every 8 (eight) hours as needed for muscle spasms.) 60 tablet 0   docusate sodium (COLACE) 100 MG capsule Take 1 capsule (100 mg total) by mouth 2 (two) times daily as needed for mild constipation. (Patient taking differently: Take 100 mg by mouth every 12 (twelve) hours as needed (for constipation).) 60 capsule 2   Ensure (ENSURE) Take 237 mLs by mouth 3 (three) times daily.     ferrous sulfate 325 (65 FE) MG tablet Take 325 mg by  mouth daily with breakfast.     folic acid (FOLVITE) 1 MG tablet Take 1 tablet by mouth daily.     gabapentin (NEURONTIN) 300 MG capsule Take 1 capsule (300 mg total) by mouth 2 (two) times daily. (Patient taking differently: Take 600 mg by mouth 2 (two) times daily.)     hydrOXYzine (VISTARIL) 50 MG capsule Take 50 mg by mouth in the morning and at bedtime.     KEPPRA 750 MG tablet Take 1,500 mg by mouth 2 (two) times daily.     LAMICTAL 100 MG tablet Take 100 mg by mouth 2 (two) times daily.     lisinopril-hydrochlorothiazide (ZESTORETIC) 20-12.5 MG tablet Take 1 tablet by mouth daily.     Magnesium 400 MG TABS Take 400 mg by mouth daily. 14 tablet 0   metroNIDAZOLE (FLAGYL) 500 MG tablet Take 500 mg by mouth 3 (three) times daily.     pantoprazole (PROTONIX) 40 MG tablet Take 1 tablet (40 mg total) by mouth at bedtime. 50 tablet 2   PRESCRIPTION MEDICATION Apply 1 application. topically 3 (three) times a week. Add 1 scoop ('500mg'$ ) of compounded powder and 10 pumps of Bassa-Gel to mixing jar, mix together until mixed completely. Apply the gel to the affected area(s) daily or with dressing changes as directed POW Gentamicin-Vancomycin-Itraconazole 33-25-5% Powder Sig: Add 1 scoop ('500mg'$ ) of compounded powder and 10 pumps of Bassa-Gel to mixing jar, mix together until mixed completely. Apply the gel to the affected area(s) daily or with dressing changes as directed     QUEtiapine (SEROQUEL) 200 MG tablet Take  200 mg by mouth at bedtime.     rosuvastatin (CRESTOR) 10 MG tablet Take 1 tablet (10 mg total) by mouth daily. 30 tablet 11   senna-docusate (SENOKOT-S) 8.6-50 MG tablet Take 2 tablets by mouth at bedtime.     sertraline (ZOLOFT) 100 MG tablet Take 100 mg by mouth daily after breakfast.     sodium bicarbonate 650 MG tablet Take 650 mg by mouth 3 (three) times daily.     vitamin B-12 (CYANOCOBALAMIN) 500 MCG tablet Take 500 mcg by mouth daily.     zinc gluconate 50 MG tablet Take 50 mg by mouth daily.     No current facility-administered medications for this visit.    No Known Allergies   REVIEW OF SYSTEMS:   '[X]'$  denotes positive finding, '[ ]'$  denotes negative finding Cardiac  Comments:  Chest pain or chest pressure:    Shortness of breath upon exertion:    Short of breath when lying flat:    Irregular heart rhythm:        Vascular    Pain in calf, thigh, or hip brought on by ambulation:    Pain in feet at night that wakes you up from your sleep:     Blood clot in your veins:    Leg swelling:         Pulmonary    Oxygen at home:    Productive cough:     Wheezing:         Neurologic    Sudden weakness in arms or legs:     Sudden numbness in arms or legs:     Sudden onset of difficulty speaking or slurred speech:    Temporary loss of vision in one eye:     Problems with dizziness:         Gastrointestinal    Blood in stool:     Vomited blood:  Genitourinary    Burning when urinating:     Blood in urine:        Psychiatric    Major depression:         Hematologic    Bleeding problems:    Problems with blood clotting too easily:        Skin    Rashes or ulcers:        Constitutional    Fever or chills:      PHYSICAL EXAMINATION:  Vitals:   07/15/22 1243  BP: (!) 184/103  Pulse: 73  Resp: 20  Temp: 98.4 F (36.9 C)  TempSrc: Temporal  SpO2: 98%  Weight: 281 lb 4.8 oz (127.6 kg)  Height: 6' (1.829 m)    General:  WDWN in NAD; vital signs  documented above Gait: Not observed HENT: WNL, normocephalic Pulmonary: normal non-labored breathing , without Rales, rhonchi,  wheezing Cardiac: regular HR Abdomen: soft, NT, no masses Skin: without rashes Vascular Exam/Pulses: Brisk left PT and DP signal by Doppler Extremities: Wound pictured below Musculoskeletal: no muscle wasting or atrophy  Neurologic: A&O X 3;  No focal weakness or paresthesias are detected Psychiatric:  The pt has Normal affect.          ASSESSMENT/PLAN:: 62 y.o. male here for follow up for wound check  -Patient has combined venous and arterial insufficiency.  His left leg appears to be well-perfused with brisk DP and PT signal by Doppler.  Edema of the left leg has been poorly managed.  Since he has been dealing with this same wound of his left shin over the past 6 months we will try placing patient in an Unna boot.  We will also reach out to [facility] to set him up for weekly Unna boot changes.  He will follow-up with [place] in another 4 to 6 weeks with left lower extremity arterial duplex and ABI.  He knows to call/return office sooner with any questions or concerns.   Dagoberto Ligas, PA-C [facility] [phone]  Clinic MD:   Stanford Breed

## 2022-07-28 ENCOUNTER — Ambulatory Visit: Payer: No Typology Code available for payment source

## 2022-08-04 ENCOUNTER — Encounter: Payer: Self-pay | Admitting: Physician Assistant

## 2022-08-04 ENCOUNTER — Ambulatory Visit (HOSPITAL_COMMUNITY)
Admission: RE | Admit: 2022-08-04 | Discharge: 2022-08-04 | Disposition: A | Discharge status: Home / Self Care | Payer: No Typology Code available for payment source | Source: Ambulatory Visit | Attending: Surgery | Admitting: Surgery

## 2022-08-04 ENCOUNTER — Ambulatory Visit (INDEPENDENT_AMBULATORY_CARE_PROVIDER_SITE_OTHER): Payer: No Typology Code available for payment source | Admitting: Physician Assistant

## 2022-08-04 ENCOUNTER — Encounter (HOSPITAL_COMMUNITY): Payer: Self-pay

## 2022-08-04 VITALS — BP 159/94 | HR 87 | Temp 98.7°F | Resp 16 | Ht 72.0 in | Wt 280.0 lb

## 2022-08-04 DIAGNOSIS — I70229 Atherosclerosis of native arteries of extremities with rest pain, unspecified extremity: Secondary | ICD-10-CM

## 2022-08-04 DIAGNOSIS — I739 Peripheral vascular disease, unspecified: Secondary | ICD-10-CM

## 2022-08-04 DIAGNOSIS — S81802D Unspecified open wound, left lower leg, subsequent encounter: Secondary | ICD-10-CM

## 2022-08-04 DIAGNOSIS — F17200 Nicotine dependence, unspecified, uncomplicated: Secondary | ICD-10-CM

## 2022-08-04 DIAGNOSIS — M25569 Pain in unspecified knee: Secondary | ICD-10-CM

## 2022-08-04 DIAGNOSIS — I872 Venous insufficiency (chronic) (peripheral): Secondary | ICD-10-CM | POA: Diagnosis not present

## 2022-08-04 NOTE — Progress Notes (Signed)
HISTORY AND PHYSICAL     CC:  follow up. Requesting Provider:  Reubin Milan, MD  HPI: This is a 62 y.o. male who is here today for follow up for PAD.  Pt has hx of LLE wounds.  He underwent angiogram on 10/22/2021 by Dr. Trula Slade with stenting of the left SFA.    Pt was last seen 07/15/2022 and at that time, he had a  wound on the left shin and posterior calf for over six months.  He stated he was unable to manage his edema in the LLE and had thin clear drainage from both wounds during the day.  He was not wearing compression.  He did nt have and rest pain or tissue loss of the left foot. He had a venous duplex study 06/27/2022 that revealed deep venous reflux as well as GSV in the proximal thigh but not a candidate for laser ablation.  He had brisk left DP/PT doppler flow.  He was placed in unna boot and set up [place] to change this weekly.  He was scheduled for 6 week follow up with studies. He had arterial studies in January and his left SFA stent was patent and ABI's were within normal range.   The pt returns today for follow up.  He states that he feels the wound is improving.  He states that when [facility] put the unna boot on, he had more swelling in his foot. He denies any rest pain or claudication. He gets some cramping in his leg at times but not with walking.    The pt is on a statin for cholesterol management.    The pt is on an aspirin.    Other AC:  plavix The pt is on CCB, clonidine, ACEI, diuretic for hypertension.  The pt does not have diabetes. Tobacco hx:  current  Pt does not have family hx of AAA.  Pt is [employer] fan.  Past Medical History:  Diagnosis Date   Anxiety    Arthritis    Bipolar 1 disorder, depressed ([place])    Bipolar disorder ([place])    Depression    History of posttraumatic stress disorder (PTSD)    Hypertension    PTSD (post-traumatic stress disorder)    Seizures (HCC)    Sleep apnea    Smoker    Stroke [facility])    Tobacco use disorder     Past Surgical History:   Procedure Laterality Date   ABDOMINAL AORTOGRAM W/LOWER EXTREMITY Left 10/22/2021   Procedure: ABDOMINAL AORTOGRAM W/LOWER EXTREMITY;  Surgeon: Serafina Mitchell, MD;  Location: [place] [facility];  Service: Cardiovascular;  Laterality: Left;   ANTERIOR LATERAL LUMBAR FUSION 4 LEVELS N/A 12/12/2020   Procedure: Anterior Lateral Interbody Fusion Lumbar One-Two, Lumbar Two-Three, Lumbar Three-Four, Lumbar Four-Five;  Surgeon: Franchot Gallo, MD;  Location: [place];  Service: Urology;  Laterality: N/A;  Anterior Lateral Interbody Fusion Lumbar One-Two, Lumbar Two-Three, Lumbar Three-Four, Lumbar Four-Five   APPLICATION OF INTRAOPERATIVE CT SCAN N/A 12/13/2020   Procedure: APPLICATION OF INTRAOPERATIVE CT SCAN;  Surgeon: Vallarie Mare, MD;  Location: [facility] OR;  Service: Neurosurgery;  Laterality: N/A;   APPLICATION OF WOUND VAC N/A 01/17/2021   Procedure: APPLICATION OF WOUND VAC;  Surgeon: Vallarie Mare, MD;  Location: [place];  Service: Neurosurgery;  Laterality: N/A;   APPLICATION OF WOUND VAC N/A 02/26/2021   Procedure: APPLICATION OF WOUND VAC;  Surgeon: Vallarie Mare, MD;  Location: [place];  Service: Neurosurgery;  Laterality: N/A;  CYSTOSCOPY  12/12/2020   Procedure: CYSTOSCOPY, URETHRAL DILATION, DIFFICULT FOLEY INSERTION;  Surgeon: Franchot Gallo, MD;  Location: [place];  Service: Urology;;   IR FLUORO GUIDE CV LINE RIGHT  12/21/2020   IR IVC FILTER PLMT / S&I /IMG GUID/MOD SED  12/16/2020   IR REMOVAL TUN CV CATH W/O FL  12/31/2020   IR US GUIDE VASC ACCESS RIGHT  12/21/2020   LUMBAR WOUND DEBRIDEMENT N/A 02/26/2021   Procedure: LUMBAR WOUND DEBRIDEMENT;  Surgeon: Vallarie Mare, MD;  Location: [place];  Service: Neurosurgery;  Laterality: N/A;   PERIPHERAL VASCULAR INTERVENTION Left 10/22/2021   Procedure: PERIPHERAL VASCULAR INTERVENTION;  Surgeon: Serafina Mitchell, MD;  Location: [place] [facility];  Service: Cardiovascular;  Laterality: Left;  SFA   POSTERIOR LUMBAR FUSION 4 LEVEL  N/A 12/13/2020   Procedure: Thoracic Ten - ILIAC FUSION, L5-S1 TLIF, Posterior osteotomies for deformity correction and decompression L2-3, L3-4, L4-5;  Surgeon: Vallarie Mare, MD;  Location: [place];  Service: Neurosurgery;  Laterality: N/A;   WOUND EXPLORATION N/A 01/17/2021   Procedure: THORACOLUMBAR WOUND EXPLORATION;  Surgeon: Vallarie Mare, MD;  Location: [place];  Service: Neurosurgery;  Laterality: N/A;    No Known Allergies  Current Outpatient Medications  Medication Sig Dispense Refill   acetaminophen (TYLENOL) 500 MG tablet Take 2 tablets (1,000 mg total) by mouth every 6 (six) hours as needed for mild pain. 30 tablet 0   amLODipine (NORVASC) 10 MG tablet Take 10 mg by mouth every morning.     aspirin 81 MG chewable tablet Chew 1 tablet (81 mg total) by mouth daily. 60 tablet 3   baclofen (LIORESAL) 10 MG tablet Take 10 mg by mouth 2 (two) times daily.     cephALEXin (KEFLEX) 500 MG capsule Take 1 capsule (500 mg total) by mouth 3 (three) times daily. 21 capsule 1   Cholecalciferol (VITAMIN D3) 50 MCG (2000 UT) capsule Take 2,000 Units by mouth 2 (two) times daily.     cloNIDine (CATAPRES) 0.1 MG tablet Take 0.1 mg by mouth every 8 (eight) hours as needed. If systolic blood pressure greater than 160     clopidogrel (PLAVIX) 75 MG tablet Take 1 tablet (75 mg total) by mouth daily. 30 tablet 11   cyclobenzaprine (FLEXERIL) 10 MG tablet Take 1 tablet (10 mg total) by mouth 3 (three) times daily as needed for muscle spasms. (Patient taking differently: Take 10 mg by mouth every 8 (eight) hours as needed for muscle spasms.) 60 tablet 0   docusate sodium (COLACE) 100 MG capsule Take 1 capsule (100 mg total) by mouth 2 (two) times daily as needed for mild constipation. (Patient taking differently: Take 100 mg by mouth every 12 (twelve) hours as needed (for constipation).) 60 capsule 2   Ensure (ENSURE) Take 237 mLs by mouth 3 (three) times daily.     ferrous sulfate 325 (65 FE) MG tablet  Take 325 mg by mouth daily with breakfast.     folic acid (FOLVITE) 1 MG tablet Take 1 tablet by mouth daily.     gabapentin (NEURONTIN) 300 MG capsule Take 1 capsule (300 mg total) by mouth 2 (two) times daily. (Patient taking differently: Take 600 mg by mouth 2 (two) times daily.)     hydrOXYzine (VISTARIL) 50 MG capsule Take 50 mg by mouth in the morning and at bedtime.     KEPPRA 750 MG tablet Take 1,500 mg by mouth 2 (two) times daily.     LAMICTAL  100 MG tablet Take 100 mg by mouth 2 (two) times daily.     lisinopril-hydrochlorothiazide (ZESTORETIC) 20-12.5 MG tablet Take 1 tablet by mouth daily.     Magnesium 400 MG TABS Take 400 mg by mouth daily. 14 tablet 0   metroNIDAZOLE (FLAGYL) 500 MG tablet Take 500 mg by mouth 3 (three) times daily.     pantoprazole (PROTONIX) 40 MG tablet Take 1 tablet (40 mg total) by mouth at bedtime. 50 tablet 2   PRESCRIPTION MEDICATION Apply 1 application. topically 3 (three) times a week. Add 1 scoop (500mg ) of compounded powder and 10 pumps of Bassa-Gel to mixing jar, mix together until mixed completely. Apply the gel to the affected area(s) daily or with dressing changes as directed POW Gentamicin-Vancomycin-Itraconazole 33-25-5% Powder Sig: Add 1 scoop (500mg ) of compounded powder and 10 pumps of Bassa-Gel to mixing jar, mix together until mixed completely. Apply the gel to the affected area(s) daily or with dressing changes as directed     QUEtiapine (SEROQUEL) 200 MG tablet Take 200 mg by mouth at bedtime.     rosuvastatin (CRESTOR) 10 MG tablet Take 1 tablet (10 mg total) by mouth daily. 30 tablet 11   senna-docusate (SENOKOT-S) 8.6-50 MG tablet Take 2 tablets by mouth at bedtime.     sertraline (ZOLOFT) 100 MG tablet Take 100 mg by mouth daily after breakfast.     sodium bicarbonate 650 MG tablet Take 650 mg by mouth 3 (three) times daily.     vitamin B-12 (CYANOCOBALAMIN) 500 MCG tablet Take 500 mcg by mouth daily.     zinc gluconate 50 MG tablet  Take 50 mg by mouth daily.     No current facility-administered medications for this visit.    Family History  Problem Relation Age of Onset   Seizures Mother    Breast cancer Mother 80   Hypertension Brother     Social History   Socioeconomic History   Marital status: Significant Other    Spouse name: Not on file   Number of children: 1   Years of education: Not on file   Highest education level: Not on file  Occupational History    Employer: [employer]  Tobacco Use   Smoking status: Every Day    Packs/day: 0.50    Years: 20.00    Additional pack years: 0.00    Total pack years: 10.00    Types: Cigarettes    Passive exposure: Never   Smokeless tobacco: Never  Vaping Use   Vaping Use: Never used  Substance and Sexual Activity   Alcohol use: Yes    Alcohol/week: 2.0 standard drinks of alcohol    Types: 2 Shots of liquor per week   Drug use: Yes    Types: Marijuana    Comment: pt stated he quit 2 weeks ago 08/10/12, pt reports doing marijuana on 11/13/12   Sexual activity: Yes  Other Topics Concern   Not on file  Social History Narrative   ** Merged History Encounter **       Social Determinants of Health   Financial Resource Strain: Not on file  Food Insecurity: Not on file  Transportation Needs: Not on file  Physical Activity: Not on file  Stress: Not on file  Social Connections: Not on file  Intimate Partner Violence: Not on file     REVIEW OF SYSTEMS:   [X]  denotes positive finding, [ ]  denotes negative finding Cardiac  Comments:  Chest pain or chest pressure:  Shortness of breath upon exertion:    Short of breath when lying flat:    Irregular heart rhythm:        Vascular    Pain in calf, thigh, or hip brought on by ambulation:    Pain in feet at night that wakes you up from your sleep:     Blood clot in your veins:    Leg swelling:         Pulmonary    Oxygen at home:    Productive cough:     Wheezing:         Neurologic    Sudden  weakness in arms or legs:     Sudden numbness in arms or legs:     Sudden onset of difficulty speaking or slurred speech:    Temporary loss of vision in one eye:     Problems with dizziness:         Gastrointestinal    Blood in stool:     Vomited blood:         Genitourinary    Burning when urinating:     Blood in urine:        Psychiatric    Major depression:         Hematologic    Bleeding problems:    Problems with blood clotting too easily:        Skin    Rashes or ulcers:        Constitutional    Fever or chills:      PHYSICAL EXAMINATION:  Today's Vitals   08/04/22 1241  BP: (!) 159/94  Pulse: 87  Resp: 16  Temp: 98.7 F (37.1 C)  TempSrc: Temporal  SpO2: 99%  Weight: 280 lb (127 kg)  Height: 6' (1.829 m)   Body mass index is 37.97 kg/m.   General:  WDWN in NAD; vital signs documented above Gait: Not observed HENT: WNL, normocephalic Pulmonary: normal non-labored breathing , without wheezing Cardiac: regular HR, without carotid bruits Skin: without rashes Vascular Exam/Pulses:  Right Left  Radial 2+ (normal) 2+ (normal)  DP Brisk doppler  Brisk doppler  PT Brisk doppler Brisk doppler   Extremities: swelling LLE with wound as pictured  Musculoskeletal: no muscle wasting or atrophy  Neurologic: A&O X 3 Psychiatric:  The pt has Normal affect.     Previous ABI's/TBI's on 06/06/2022: Right:  1.04/0.78 - Great toe pressure: 130 Left:  1.19/0.88 - Great toe pressure:  147  Previous arterial duplex on 06/06/2022: Left: Patent stent with no evidence of stenosis in the superficial femoral  artery     ASSESSMENT/PLAN:: 62 y.o. male here for follow up for PAD with hx of venous insufficiency and venous wound  Venous ulceration LLE -pt feels wound has improved with unna boot compression.  Will reapply unna boot today and weekly change thereafter with HH.   -continue with leg elevation and compression.  Discussed proper way to elevate legs.  -pt  will f/u in 4 weeks for wound check.  PAD -pt had arterial studies in January.  Will bring him back in July for repeat studies.  He knows to call sooner if any issues. -discussed keeping feet moisturized  Current smoker -discussed importance of smoking cessation with pt to include risk of amputation, heart attack, stroke, cancer.   Leontine Locket, [facility] [phone]  Clinic MD:   Trula Slade

## 2022-08-06 ENCOUNTER — Other Ambulatory Visit: Payer: Self-pay

## 2022-08-06 DIAGNOSIS — I739 Peripheral vascular disease, unspecified: Secondary | ICD-10-CM

## 2022-08-06 DIAGNOSIS — I70222 Atherosclerosis of native arteries of extremities with rest pain, left leg: Secondary | ICD-10-CM

## 2022-08-18 ENCOUNTER — Telehealth: Payer: Self-pay

## 2022-08-18 NOTE — Telephone Encounter (Signed)
Hinton Dyer, RN with [phone] [facility] called stating that she placed an Unna boot on the pt last Thursday for a LLE venous ulcer. She came back today and the dressing was off. She went to clean his leg and apply another Radio broadcast assistant.  The pt told her that last week, he had put carpet cleanser in the wound cleanser spray bottle because the sprayer wouldn't work on the cleaner bottle. So, when he took the dressing off today and washed his leg, he used the carpet cleaner instead of the wound wash. He rinsed his leg off after he felt some burning. The RN inspected the leg and did not see any ill effects of the cleanser. She washed his leg with wound cleaner and applied another Unna boot. Also informed of pt's elevated BP readings, but pt had not taken BP meds and had a seizure this AM. Confirmed understanding.

## 2022-09-01 ENCOUNTER — Ambulatory Visit (INDEPENDENT_AMBULATORY_CARE_PROVIDER_SITE_OTHER): Payer: No Typology Code available for payment source | Admitting: Physician Assistant

## 2022-09-01 VITALS — BP 160/93 | HR 83 | Temp 97.7°F

## 2022-09-01 DIAGNOSIS — I872 Venous insufficiency (chronic) (peripheral): Secondary | ICD-10-CM | POA: Diagnosis not present

## 2022-09-01 DIAGNOSIS — I739 Peripheral vascular disease, unspecified: Secondary | ICD-10-CM | POA: Diagnosis not present

## 2022-09-01 DIAGNOSIS — S81802D Unspecified open wound, left lower leg, subsequent encounter: Secondary | ICD-10-CM | POA: Diagnosis not present

## 2022-09-01 NOTE — Progress Notes (Signed)
Office Note     CC:  follow up Requesting Provider:  Cleta Alberts, MD  HPI: Keith Dixon is a 62 y.o. (1960-06-06) male who presents for wound check of left lower extremity venous ulceration.  He has had the same wound on his left lateral shin for nearly the past year.  He has combined venous and arterial insufficiency.  He underwent left SFA stenting by Dr. Myra Gianotti a month 10/22/2021 which helped heal his left heel wound.  He was started on Unna boots in early March of this year.  He believes the Unna boots are helping dramatically.  He denies fevers, chills, nausea/vomiting.  He is on a aspirin and statin daily.   Past Medical History:  Diagnosis Date   Anxiety    Arthritis    Bipolar 1 disorder, depressed    Bipolar disorder    Depression    History of posttraumatic stress disorder (PTSD)    Hypertension    PTSD (post-traumatic stress disorder)    Seizures    Sleep apnea    Smoker    Stroke    Tobacco use disorder     Past Surgical History:  Procedure Laterality Date   ABDOMINAL AORTOGRAM W/LOWER EXTREMITY Left 10/22/2021   Procedure: ABDOMINAL AORTOGRAM W/LOWER EXTREMITY;  Surgeon: Nada Libman, MD;  Location: [facility];  Service: Cardiovascular;  Laterality: Left;   ANTERIOR LATERAL LUMBAR FUSION 4 LEVELS N/A 12/12/2020   Procedure: Anterior Lateral Interbody Fusion Lumbar One-Two, Lumbar Two-Three, Lumbar Three-Four, Lumbar Four-Five;  Surgeon: Marcine Matar, MD;  Location: [facility] OR;  Service: Urology;  Laterality: N/A;  Anterior Lateral Interbody Fusion Lumbar One-Two, Lumbar Two-Three, Lumbar Three-Four, Lumbar Four-Five   APPLICATION OF INTRAOPERATIVE CT SCAN N/A 12/13/2020   Procedure: APPLICATION OF INTRAOPERATIVE CT SCAN;  Surgeon: Bedelia Person, MD;  Location: [facility] OR;  Service: Neurosurgery;  Laterality: N/A;   APPLICATION OF WOUND VAC N/A 01/17/2021   Procedure: APPLICATION OF WOUND VAC;  Surgeon: Bedelia Person, MD;  Location: [facility] OR;  Service:  Neurosurgery;  Laterality: N/A;   APPLICATION OF WOUND VAC N/A 02/26/2021   Procedure: APPLICATION OF WOUND VAC;  Surgeon: Bedelia Person, MD;  Location: [facility] OR;  Service: Neurosurgery;  Laterality: N/A;   CYSTOSCOPY  12/12/2020   Procedure: CYSTOSCOPY, URETHRAL DILATION, DIFFICULT FOLEY INSERTION;  Surgeon: Marcine Matar, MD;  Location: [facility] OR;  Service: Urology;;   IR FLUORO GUIDE CV LINE RIGHT  12/21/2020   IR IVC FILTER PLMT / S&I /IMG GUID/MOD SED  12/16/2020   IR REMOVAL TUN CV CATH W/O FL  12/31/2020   IR US GUIDE VASC ACCESS RIGHT  12/21/2020   LUMBAR WOUND DEBRIDEMENT N/A 02/26/2021   Procedure: LUMBAR WOUND DEBRIDEMENT;  Surgeon: Bedelia Person, MD;  Location: [facility] OR;  Service: Neurosurgery;  Laterality: N/A;   PERIPHERAL VASCULAR INTERVENTION Left 10/22/2021   Procedure: PERIPHERAL VASCULAR INTERVENTION;  Surgeon: Nada Libman, MD;  Location: [facility];  Service: Cardiovascular;  Laterality: Left;  SFA   POSTERIOR LUMBAR FUSION 4 LEVEL N/A 12/13/2020   Procedure: Thoracic Ten - ILIAC FUSION, L5-S1 TLIF, Posterior osteotomies for deformity correction and decompression L2-3, L3-4, L4-5;  Surgeon: Bedelia Person, MD;  Location: [facility] OR;  Service: Neurosurgery;  Laterality: N/A;   WOUND EXPLORATION N/A 01/17/2021   Procedure: THORACOLUMBAR WOUND EXPLORATION;  Surgeon: Bedelia Person, MD;  Location: [facility] OR;  Service: Neurosurgery;  Laterality: N/A;    Social History   Socioeconomic History  Marital status: Significant Other    Spouse name: Not on file   Number of children: 1   Years of education: Not on file   Highest education level: Not on file  Occupational History    Employer: [employer]  Tobacco Use   Smoking status: Every Day    Packs/day: 0.50    Years: 20.00    Additional pack years: 0.00    Total pack years: 10.00    Types: Cigarettes    Passive exposure: Never   Smokeless tobacco: Never   Tobacco comments:    8 cigarettes a day   Vaping Use    Vaping Use: Never used  Substance and Sexual Activity   Alcohol use: Yes    Alcohol/week: 2.0 standard drinks of alcohol    Types: 2 Shots of liquor per week   Drug use: Yes    Types: Marijuana    Comment: pt stated he quit 2 weeks ago 08/10/12, pt reports doing marijuana on 11/13/12   Sexual activity: Yes  Other Topics Concern   Not on file  Social History Narrative   ** Merged History Encounter **       Social Determinants of Health   Financial Resource Strain: Not on file  Food Insecurity: Not on file  Transportation Needs: Not on file  Physical Activity: Not on file  Stress: Not on file  Social Connections: Not on file  Intimate Partner Violence: Not on file    Family History  Problem Relation Age of Onset   Seizures Mother    Breast cancer Mother 42   Hypertension Brother     Current Outpatient Medications  Medication Sig Dispense Refill   acetaminophen (TYLENOL) 500 MG tablet Take 2 tablets (1,000 mg total) by mouth every 6 (six) hours as needed for mild pain. 30 tablet 0   amLODipine (NORVASC) 10 MG tablet Take 10 mg by mouth every morning.     aspirin 81 MG chewable tablet Chew 1 tablet (81 mg total) by mouth daily. 60 tablet 3   baclofen (LIORESAL) 10 MG tablet Take 10 mg by mouth 2 (two) times daily.     cephALEXin (KEFLEX) 500 MG capsule Take 1 capsule (500 mg total) by mouth 3 (three) times daily. 21 capsule 1   Cholecalciferol (VITAMIN D3) 50 MCG (2000 UT) capsule Take 2,000 Units by mouth 2 (two) times daily.     cloNIDine (CATAPRES) 0.1 MG tablet Take 0.1 mg by mouth every 8 (eight) hours as needed. If systolic blood pressure greater than 160     clopidogrel (PLAVIX) 75 MG tablet Take 1 tablet (75 mg total) by mouth daily. 30 tablet 11   cyclobenzaprine (FLEXERIL) 10 MG tablet Take 1 tablet (10 mg total) by mouth 3 (three) times daily as needed for muscle spasms. (Patient taking differently: Take 10 mg by mouth every 8 (eight) hours as needed for muscle  spasms.) 60 tablet 0   docusate sodium (COLACE) 100 MG capsule Take 1 capsule (100 mg total) by mouth 2 (two) times daily as needed for mild constipation. (Patient taking differently: Take 100 mg by mouth every 12 (twelve) hours as needed (for constipation).) 60 capsule 2   Ensure (ENSURE) Take 237 mLs by mouth 3 (three) times daily.     ferrous sulfate 325 (65 FE) MG tablet Take 325 mg by mouth daily with breakfast.     folic acid (FOLVITE) 1 MG tablet Take 1 tablet by mouth daily.     gabapentin (  NEURONTIN) 300 MG capsule Take 1 capsule (300 mg total) by mouth 2 (two) times daily. (Patient taking differently: Take 600 mg by mouth 2 (two) times daily.)     hydrOXYzine (VISTARIL) 50 MG capsule Take 50 mg by mouth in the morning and at bedtime.     KEPPRA 750 MG tablet Take 1,500 mg by mouth 2 (two) times daily.     LAMICTAL 100 MG tablet Take 100 mg by mouth 2 (two) times daily.     lisinopril-hydrochlorothiazide (ZESTORETIC) 20-12.5 MG tablet Take 1 tablet by mouth daily.     Magnesium 400 MG TABS Take 400 mg by mouth daily. 14 tablet 0   metroNIDAZOLE (FLAGYL) 500 MG tablet Take 500 mg by mouth 3 (three) times daily.     pantoprazole (PROTONIX) 40 MG tablet Take 1 tablet (40 mg total) by mouth at bedtime. 50 tablet 2   PRESCRIPTION MEDICATION Apply 1 application. topically 3 (three) times a week. Add 1 scoop (500mg ) of compounded powder and 10 pumps of Bassa-Gel to mixing jar, mix together until mixed completely. Apply the gel to the affected area(s) daily or with dressing changes as directed POW Gentamicin-Vancomycin-Itraconazole 33-25-5% Powder Sig: Add 1 scoop (500mg ) of compounded powder and 10 pumps of Bassa-Gel to mixing jar, mix together until mixed completely. Apply the gel to the affected area(s) daily or with dressing changes as directed     QUEtiapine (SEROQUEL) 200 MG tablet Take 200 mg by mouth at bedtime.     rosuvastatin (CRESTOR) 10 MG tablet Take 1 tablet (10 mg total) by mouth  daily. 30 tablet 11   senna-docusate (SENOKOT-S) 8.6-50 MG tablet Take 2 tablets by mouth at bedtime.     sertraline (ZOLOFT) 100 MG tablet Take 100 mg by mouth daily after breakfast.     sodium bicarbonate 650 MG tablet Take 650 mg by mouth 3 (three) times daily.     vitamin B-12 (CYANOCOBALAMIN) 500 MCG tablet Take 500 mcg by mouth daily.     zinc gluconate 50 MG tablet Take 50 mg by mouth daily.     No current facility-administered medications for this visit.    No Known Allergies   REVIEW OF SYSTEMS:   [X]  denotes positive finding, [ ]  denotes negative finding Cardiac  Comments:  Chest pain or chest pressure:    Shortness of breath upon exertion:    Short of breath when lying flat:    Irregular heart rhythm:        Vascular    Pain in calf, thigh, or hip brought on by ambulation:    Pain in feet at night that wakes you up from your sleep:     Blood clot in your veins:    Leg swelling:         Pulmonary    Oxygen at home:    Productive cough:     Wheezing:         Neurologic    Sudden weakness in arms or legs:     Sudden numbness in arms or legs:     Sudden onset of difficulty speaking or slurred speech:    Temporary loss of vision in one eye:     Problems with dizziness:         Gastrointestinal    Blood in stool:     Vomited blood:         Genitourinary    Burning when urinating:     Blood in urine:  Psychiatric    Major depression:         Hematologic    Bleeding problems:    Problems with blood clotting too easily:        Skin    Rashes or ulcers:        Constitutional    Fever or chills:      PHYSICAL EXAMINATION:  Vitals:   09/01/22 1029  BP: (!) 160/93  Pulse: 83  Temp: 97.7 F (36.5 C)  TempSrc: Temporal  SpO2: 99%    General:  WDWN in NAD; vital signs documented above Gait: Not observed HENT: WNL, normocephalic Pulmonary: normal non-labored breathing , without Rales, rhonchi,  wheezing Cardiac: regular HR Abdomen: soft,  NT, no masses Skin: without rashes Extremities: L shin pictured below; wound appears superficial Musculoskeletal: no muscle wasting or atrophy  Neurologic: A&O X 3 Psychiatric:  The pt has Normal affect.       ASSESSMENT/PLAN:: 62 y.o. male here for follow up for wound check of left lateral lower leg; patient with known combined arterial and venous insufficiency  -Left lower extremity well-perfused based on Doppler exam.  Patient agrees that wound has drastically improved since initiation of Unna boots.  We will continue weekly Unna boot changes.  Unna boot was changed today in the office.  Home health will continue weekly changes until he is seen in June for a wound check and imaging studies.  Patient will call/return office sooner with any questions or concerns.  I also reminded him to try to elevate his legs when possible during the day.   Emilie Rutter, PA-C [facility] [phone]  Clinic MD:   Myra Gianotti

## 2022-09-30 ENCOUNTER — Telehealth: Payer: Self-pay

## 2022-09-30 NOTE — Telephone Encounter (Signed)
[facility] nurse called to let us know pt is have LLE swelling from mid thigh to ankle. However, his LLE wound is "pretty well healed".Per APP, pt can keep his 10/20/22 appt with [place] for studies and to see APP. Once wound is healed, he can stop unna boot and use compression. I have advised Andrey Campanile of this and she had already let his family know. I have spoken to his daughter as well about this and encouraged her to have pt elevate his leg more frequently. Andrey Campanile has also reinforced this. They will let us know if anything changes/worsens.

## 2022-10-20 ENCOUNTER — Ambulatory Visit (HOSPITAL_COMMUNITY)
Admission: RE | Admit: 2022-10-20 | Discharge: 2022-10-20 | Disposition: A | Discharge status: Home / Self Care | Payer: No Typology Code available for payment source | Source: Ambulatory Visit | Attending: Surgery | Admitting: Surgery

## 2022-10-20 ENCOUNTER — Ambulatory Visit (INDEPENDENT_AMBULATORY_CARE_PROVIDER_SITE_OTHER): Payer: No Typology Code available for payment source | Admitting: Physician Assistant

## 2022-10-20 ENCOUNTER — Other Ambulatory Visit: Payer: Self-pay | Admitting: Surgery

## 2022-10-20 ENCOUNTER — Ambulatory Visit (INDEPENDENT_AMBULATORY_CARE_PROVIDER_SITE_OTHER)
Admission: RE | Admit: 2022-10-20 | Discharge: 2022-10-20 | Disposition: A | Discharge status: Home / Self Care | Payer: No Typology Code available for payment source | Source: Ambulatory Visit | Attending: Surgery | Admitting: Surgery

## 2022-10-20 VITALS — BP 144/94 | HR 80

## 2022-10-20 DIAGNOSIS — I872 Venous insufficiency (chronic) (peripheral): Secondary | ICD-10-CM | POA: Diagnosis not present

## 2022-10-20 DIAGNOSIS — I70222 Atherosclerosis of native arteries of extremities with rest pain, left leg: Secondary | ICD-10-CM

## 2022-10-20 DIAGNOSIS — I739 Peripheral vascular disease, unspecified: Secondary | ICD-10-CM

## 2022-10-20 DIAGNOSIS — F17200 Nicotine dependence, unspecified, uncomplicated: Secondary | ICD-10-CM | POA: Diagnosis not present

## 2022-10-20 LAB — VAS US ABI WITH/WO TBI
Left ABI: 1.05
Right ABI: 0.81

## 2022-10-20 NOTE — Progress Notes (Signed)
HISTORY AND PHYSICAL     CC:  follow up. Requesting Provider:  Cleta Alberts, MD  HPI: This is a 62 y.o. male who is here today for follow up for PAD.   Pt has hx of LLE wounds.  He underwent angiogram on 10/22/2021 by Dr. Myra Gianotti with stenting of the left SFA for a left heel wound that did heal.  He was seen back in March with a venous ulceration and was placed in an unna boot.    Pt was last seen 09/01/2022 and at that time, it was felt that his wound was healing.  He was scheduled for 4 week follow up with arterial studies. He was instructed to try to elevate his legs when possible.   The pt returns today for follow up.  He states that his wound has healed.  He does wear his compression and elevates his legs.    He states he gets some cramps in his legs but not while walking.   He has a sore above the left knee and he states this is getting better. He continues to smoke but has cut back.  He tells me he is going to quit on July 7th.  When I asked why this date, it is b/c his birthday is [birth year].  He is down to three packs per week.  He is compliant with his asa/statin/plavix   The pt is on a statin for cholesterol management.    The pt is on an aspirin.    Other AC:  Plavix The pt is on CCB, ACEI, diuretic, catapres for hypertension.  The pt is not on medication for diabetes. Tobacco hx:  current  Pt does not have family hx of AAA.  Past Medical History:  Diagnosis Date   Anxiety    Arthritis    Bipolar 1 disorder, depressed (HCC)    Bipolar disorder (HCC)    Depression    History of posttraumatic stress disorder (PTSD)    Hypertension    PTSD (post-traumatic stress disorder)    Seizures (HCC)    Sleep apnea    Smoker    Stroke [facility])    Tobacco use disorder     Past Surgical History:  Procedure Laterality Date   ABDOMINAL AORTOGRAM W/LOWER EXTREMITY Left 10/22/2021   Procedure: ABDOMINAL AORTOGRAM W/LOWER EXTREMITY;  Surgeon: Nada Libman, MD;  Location: [facility] INVASIVE  CV LAB;  Service: Cardiovascular;  Laterality: Left;   ANTERIOR LATERAL LUMBAR FUSION 4 LEVELS N/A 12/12/2020   Procedure: Anterior Lateral Interbody Fusion Lumbar One-Two, Lumbar Two-Three, Lumbar Three-Four, Lumbar Four-Five;  Surgeon: Marcine Matar, MD;  Location: [facility] OR;  Service: Urology;  Laterality: N/A;  Anterior Lateral Interbody Fusion Lumbar One-Two, Lumbar Two-Three, Lumbar Three-Four, Lumbar Four-Five   APPLICATION OF INTRAOPERATIVE CT SCAN N/A 12/13/2020   Procedure: APPLICATION OF INTRAOPERATIVE CT SCAN;  Surgeon: Bedelia Person, MD;  Location: [facility] OR;  Service: Neurosurgery;  Laterality: N/A;   APPLICATION OF WOUND VAC N/A 01/17/2021   Procedure: APPLICATION OF WOUND VAC;  Surgeon: Bedelia Person, MD;  Location: [facility] OR;  Service: Neurosurgery;  Laterality: N/A;   APPLICATION OF WOUND VAC N/A 02/26/2021   Procedure: APPLICATION OF WOUND VAC;  Surgeon: Bedelia Person, MD;  Location: [facility] OR;  Service: Neurosurgery;  Laterality: N/A;   CYSTOSCOPY  12/12/2020   Procedure: CYSTOSCOPY, URETHRAL DILATION, DIFFICULT FOLEY INSERTION;  Surgeon: Marcine Matar, MD;  Location: [facility] OR;  Service: Urology;;   IR FLUORO GUIDE CV LINE RIGHT  12/21/2020   IR IVC FILTER PLMT / S&I /IMG GUID/MOD SED  12/16/2020   IR REMOVAL TUN CV CATH W/O FL  12/31/2020   IR US GUIDE VASC ACCESS RIGHT  12/21/2020   LUMBAR WOUND DEBRIDEMENT N/A 02/26/2021   Procedure: LUMBAR WOUND DEBRIDEMENT;  Surgeon: Bedelia Person, MD;  Location: [facility] OR;  Service: Neurosurgery;  Laterality: N/A;   PERIPHERAL VASCULAR INTERVENTION Left 10/22/2021   Procedure: PERIPHERAL VASCULAR INTERVENTION;  Surgeon: Nada Libman, MD;  Location: [facility] INVASIVE CV LAB;  Service: Cardiovascular;  Laterality: Left;  SFA   POSTERIOR LUMBAR FUSION 4 LEVEL N/A 12/13/2020   Procedure: Thoracic Ten - ILIAC FUSION, L5-S1 TLIF, Posterior osteotomies for deformity correction and decompression L2-3, L3-4, L4-5;  Surgeon: Bedelia Person, MD;   Location: [facility] OR;  Service: Neurosurgery;  Laterality: N/A;   WOUND EXPLORATION N/A 01/17/2021   Procedure: THORACOLUMBAR WOUND EXPLORATION;  Surgeon: Bedelia Person, MD;  Location: [facility] OR;  Service: Neurosurgery;  Laterality: N/A;    No Known Allergies  Current Outpatient Medications  Medication Sig Dispense Refill   acetaminophen (TYLENOL) 500 MG tablet Take 2 tablets (1,000 mg total) by mouth every 6 (six) hours as needed for mild pain. 30 tablet 0   amLODipine (NORVASC) 10 MG tablet Take 10 mg by mouth every morning.     aspirin 81 MG chewable tablet Chew 1 tablet (81 mg total) by mouth daily. 60 tablet 3   baclofen (LIORESAL) 10 MG tablet Take 10 mg by mouth 2 (two) times daily.     cephALEXin (KEFLEX) 500 MG capsule Take 1 capsule (500 mg total) by mouth 3 (three) times daily. 21 capsule 1   Cholecalciferol (VITAMIN D3) 50 MCG (2000 UT) capsule Take 2,000 Units by mouth 2 (two) times daily.     cloNIDine (CATAPRES) 0.1 MG tablet Take 0.1 mg by mouth every 8 (eight) hours as needed. If systolic blood pressure greater than 160     clopidogrel (PLAVIX) 75 MG tablet Take 1 tablet (75 mg total) by mouth daily. 30 tablet 11   cyclobenzaprine (FLEXERIL) 10 MG tablet Take 1 tablet (10 mg total) by mouth 3 (three) times daily as needed for muscle spasms. (Patient taking differently: Take 10 mg by mouth every 8 (eight) hours as needed for muscle spasms.) 60 tablet 0   docusate sodium (COLACE) 100 MG capsule Take 1 capsule (100 mg total) by mouth 2 (two) times daily as needed for mild constipation. (Patient taking differently: Take 100 mg by mouth every 12 (twelve) hours as needed (for constipation).) 60 capsule 2   Ensure (ENSURE) Take 237 mLs by mouth 3 (three) times daily.     ferrous sulfate 325 (65 FE) MG tablet Take 325 mg by mouth daily with breakfast.     folic acid (FOLVITE) 1 MG tablet Take 1 tablet by mouth daily.     gabapentin (NEURONTIN) 300 MG capsule Take 1 capsule (300 mg total) by  mouth 2 (two) times daily. (Patient taking differently: Take 600 mg by mouth 2 (two) times daily.)     hydrOXYzine (VISTARIL) 50 MG capsule Take 50 mg by mouth in the morning and at bedtime.     KEPPRA 750 MG tablet Take 1,500 mg by mouth 2 (two) times daily.     LAMICTAL 100 MG tablet Take 100 mg by mouth 2 (two) times daily.     lisinopril-hydrochlorothiazide (ZESTORETIC) 20-12.5 MG tablet Take 1 tablet by mouth daily.     Magnesium 400  MG TABS Take 400 mg by mouth daily. 14 tablet 0   metroNIDAZOLE (FLAGYL) 500 MG tablet Take 500 mg by mouth 3 (three) times daily.     pantoprazole (PROTONIX) 40 MG tablet Take 1 tablet (40 mg total) by mouth at bedtime. 50 tablet 2   PRESCRIPTION MEDICATION Apply 1 application. topically 3 (three) times a week. Add 1 scoop (500mg ) of compounded powder and 10 pumps of Bassa-Gel to mixing jar, mix together until mixed completely. Apply the gel to the affected area(s) daily or with dressing changes as directed POW Gentamicin-Vancomycin-Itraconazole 33-25-5% Powder Sig: Add 1 scoop (500mg ) of compounded powder and 10 pumps of Bassa-Gel to mixing jar, mix together until mixed completely. Apply the gel to the affected area(s) daily or with dressing changes as directed     QUEtiapine (SEROQUEL) 200 MG tablet Take 200 mg by mouth at bedtime.     rosuvastatin (CRESTOR) 10 MG tablet Take 1 tablet (10 mg total) by mouth daily. 30 tablet 11   senna-docusate (SENOKOT-S) 8.6-50 MG tablet Take 2 tablets by mouth at bedtime.     sertraline (ZOLOFT) 100 MG tablet Take 100 mg by mouth daily after breakfast.     sodium bicarbonate 650 MG tablet Take 650 mg by mouth 3 (three) times daily.     vitamin B-12 (CYANOCOBALAMIN) 500 MCG tablet Take 500 mcg by mouth daily.     zinc gluconate 50 MG tablet Take 50 mg by mouth daily.     No current facility-administered medications for this visit.    Family History  Problem Relation Age of Onset   Seizures Mother    Breast cancer Mother  54   Hypertension Brother     Social History   Socioeconomic History   Marital status: Significant Other    Spouse name: Not on file   Number of children: 1   Years of education: Not on file   Highest education level: Not on file  Occupational History    Employer: [employer]  Tobacco Use   Smoking status: Every Day    Packs/day: 0.50    Years: 20.00    Additional pack years: 0.00    Total pack years: 10.00    Types: Cigarettes    Passive exposure: Never   Smokeless tobacco: Never   Tobacco comments:    8 cigarettes a day   Vaping Use   Vaping Use: Never used  Substance and Sexual Activity   Alcohol use: Yes    Alcohol/week: 2.0 standard drinks of alcohol    Types: 2 Shots of liquor per week   Drug use: Yes    Types: Marijuana    Comment: pt stated he quit 2 weeks ago 08/10/12, pt reports doing marijuana on 11/13/12   Sexual activity: Yes  Other Topics Concern   Not on file  Social History Narrative   ** Merged History Encounter **       Social Determinants of Health   Financial Resource Strain: Not on file  Food Insecurity: Not on file  Transportation Needs: Not on file  Physical Activity: Not on file  Stress: Not on file  Social Connections: Not on file  Intimate Partner Violence: Not on file     REVIEW OF SYSTEMS:   [X]  denotes positive finding, [ ]  denotes negative finding Cardiac  Comments:  Chest pain or chest pressure:    Shortness of breath upon exertion:    Short of breath when lying flat:    Irregular  heart rhythm:        Vascular    Pain in calf, thigh, or hip brought on by ambulation:    Pain in feet at night that wakes you up from your sleep:     Blood clot in your veins:    Leg swelling:         Pulmonary    Oxygen at home:    Productive cough:     Wheezing:         Neurologic    Sudden weakness in arms or legs:     Sudden numbness in arms or legs:     Sudden onset of difficulty speaking or slurred speech:    Temporary loss of  vision in one eye:     Problems with dizziness:         Gastrointestinal    Blood in stool:     Vomited blood:         Genitourinary    Burning when urinating:     Blood in urine:        Psychiatric    Major depression:         Hematologic    Bleeding problems:    Problems with blood clotting too easily:        Skin    Rashes or ulcers:        Constitutional    Fever or chills:      PHYSICAL EXAMINATION:  Today's Vitals   10/20/22 1225  BP: (!) 144/94  Pulse: 80   There is no height or weight on file to calculate BMI.   General:  WDWN in NAD; vital signs documented above Gait: Not observed HENT: WNL, normocephalic Pulmonary: normal non-labored breathing , without wheezing Cardiac: regular HR, without carotid bruits Abdomen: soft, NT; aortic pulse is not palpable Skin: without rashes Vascular Exam/Pulses:  Right Left  Radial 2+ (normal) 2+ (normal)  DP Multiphasic doppler Multiphasic doppler  PT Multiphasic doppler Multiphasic doppler   Extremities: without ischemic changes, without Gangrene , without cellulitis; without open wounds; LLE wound healed.  Small wound above left knee Musculoskeletal: no muscle wasting or atrophy  Neurologic: A&O X 3 Psychiatric:  The pt has Normal affect.   Non-Invasive Vascular Imaging:   ABI's/TBI's on 10/20/2022: Right:  0.81/0.57 - Great toe pressure: 95 Left:  1.05/0.83 - Great toe pressure: 139  Arterial duplex on 10/20/2022: +-----------+--------+-----+--------+-------------------+------------------  LEFT      PSV cm/sRatioStenosisWaveform           Comments           +-----------+--------+-----+--------+-------------------+------------------  CFA Distal 229                  triphasic                             +-----------+--------+-----+--------+-------------------+------------------  DFA       107                  triphasic                              +-----------+--------+-----+--------+-------------------+------------------  POP Distal 81                   triphasic                             +-----------+--------+-----+--------+-------------------+------------------  TP Trunk   102                  triphasic                             +-----------+--------+-----+--------+-------------------+------------------  ATA Distal 15                   monophasic                            +-----------+--------+-----+--------+-------------------+------------------  PTA Mid    114                  hyperemic triphasiccollateral present  +-----------+--------+-----+--------+-------------------+------------------  PTA Distal 92                   hyperemic triphasic                   +-----------+--------+-----+--------+-------------------+------------------  PERO Distal                         unable to insonate  +-----------+--------+-----+--------+-------------------+------------------    Left Stent(s):  +---------------+--------+--------+---------+--------+  SFA           PSV cm/sStenosisWaveform Comments  +---------------+--------+--------+---------+--------+  Prox to Stent  143             triphasic          +---------------+--------+--------+---------+--------+  Proximal Stent 130             triphasic          +---------------+--------+--------+---------+--------+  Mid Stent      103             triphasic          +---------------+--------+--------+---------+--------+  Distal Stent   102             triphasic          +---------------+--------+--------+---------+--------+  Distal to Stent112             triphasic          +---------------+--------+--------+---------+--------+    Previous ABI's/TBI's on 06/06/2022: Right:  1.04/0.78 - Great toe pressure: 130 Left:  11.19/0.88 - Great toe pressure:  147  Previous arterial duplex on 06/06/2022: Left: Patent  stent with no evidence of stenosis in the superficial femoral  artery.     ASSESSMENT/PLAN:: 62 y.o. male here for follow up for PAD with hx of  angiogram on 10/22/2021 by Dr. Myra Gianotti with stenting of the left SFA for a left heel wound that did heal.   -pt with multiphasic doppler flow bilateral feet.  His wound on the left shin has healed.  He is compliant with his compression and elevation.   -continue asa/plavix/statin -pt will f/u in 6 months with ABI and LLE arterial duplex.  He knows to call sooner if any issues before his next visit.  -current smoker-he has a date to quit, which is 11/16/2022.  Discussed importance of quitting.  He expressed understanding.  -elevated BP-discussed taking BP twice a day and recording it and taking to PCP to see if his medications need adjustments.     Doreatha Massed, [facility] [phone]  Clinic MD:   Myra Gianotti

## 2022-10-23 ENCOUNTER — Encounter: Payer: Self-pay | Admitting: Gastroenterology

## 2022-10-29 ENCOUNTER — Other Ambulatory Visit: Payer: Self-pay

## 2022-10-29 DIAGNOSIS — I739 Peripheral vascular disease, unspecified: Secondary | ICD-10-CM

## 2022-11-04 ENCOUNTER — Other Ambulatory Visit (HOSPITAL_COMMUNITY): Payer: No Typology Code available for payment source

## 2022-11-04 ENCOUNTER — Ambulatory Visit: Payer: No Typology Code available for payment source

## 2022-11-04 ENCOUNTER — Encounter (HOSPITAL_COMMUNITY): Payer: No Typology Code available for payment source

## 2022-11-14 ENCOUNTER — Telehealth: Payer: Self-pay | Admitting: *Deleted

## 2022-11-14 NOTE — Telephone Encounter (Signed)
Nurse from [place] called she is scheduled one more visit with patient she will access patient and will discontinue Home Health if wounds are healed.

## 2023-04-20 ENCOUNTER — Ambulatory Visit (INDEPENDENT_AMBULATORY_CARE_PROVIDER_SITE_OTHER): Payer: No Typology Code available for payment source | Admitting: Physician Assistant

## 2023-04-20 ENCOUNTER — Ambulatory Visit (INDEPENDENT_AMBULATORY_CARE_PROVIDER_SITE_OTHER)
Admission: RE | Admit: 2023-04-20 | Discharge: 2023-04-20 | Disposition: A | Discharge status: Home / Self Care | Payer: No Typology Code available for payment source | Source: Ambulatory Visit | Attending: Vascular Surgery

## 2023-04-20 ENCOUNTER — Ambulatory Visit (HOSPITAL_COMMUNITY)
Admission: RE | Admit: 2023-04-20 | Discharge: 2023-04-20 | Disposition: A | Discharge status: Home / Self Care | Payer: No Typology Code available for payment source | Source: Ambulatory Visit | Attending: Vascular Surgery | Admitting: Vascular Surgery

## 2023-04-20 VITALS — BP 138/85 | HR 80 | Temp 97.9°F | Ht 72.0 in | Wt 276.7 lb

## 2023-04-20 DIAGNOSIS — I739 Peripheral vascular disease, unspecified: Secondary | ICD-10-CM | POA: Diagnosis not present

## 2023-04-20 DIAGNOSIS — F17200 Nicotine dependence, unspecified, uncomplicated: Secondary | ICD-10-CM | POA: Diagnosis not present

## 2023-04-20 DIAGNOSIS — I872 Venous insufficiency (chronic) (peripheral): Secondary | ICD-10-CM | POA: Diagnosis not present

## 2023-04-20 LAB — VAS US ABI WITH/WO TBI
Left ABI: 0.84
Right ABI: 0.79

## 2023-04-20 NOTE — Progress Notes (Signed)
Office Note     CC:  follow up Requesting Provider:  Cleta Alberts, MD  HPI: Keith Dixon is a 62 y.o. ([birth year]) male who presents with his daughter today for routine follow up of PAD and chronic venous insufficiency. He has history of mixed disease with bilateral lower extremity recurrent wounds. He has undergone Angiogram with LLE SFA stenting in June of 2023 by Dr. Myra Gianotti. This was for a heal wound. This wound did heal post intervention. He has had subsequent ulcerations on his legs from his venous disease. These have been previously treated with unna boots. He has been educated on elevation and compression use.   Today he says his legs are essentially the same. He has had continued swelling bilaterally. He admittedly does not elevate or wear his compression stockings. He recently developed an ulcer on the posterior right calf. He says this has been draining quite a lot but over past week this has slowed down. He has been cleaning it with wound spray occasionally and putting Vaseline on it. He otherwise denies any pain in his legs on ambulation or rest. No wounds on his feet. He uses a [employer] to walk. He explains that he does not do much walking, certainly not outside. He has an apartment which he does walk around in. He continues to smoke about 3 packs or so per week. At his last visit he reported that he was going quit on July 7th, but he says he did not do a good job of stopping. Now he says his goal is New Years.  He otherwise is compliant with his asa/statin/plavix    The pt is on a statin for cholesterol management.    The pt is on an aspirin.    Other AC:  Plavix The pt is on CCB, ACEI, diuretic, catapres for hypertension.  The pt is not on medication for diabetes. Tobacco hx:  current  Past Medical History:  Diagnosis Date   Anxiety    Arthritis    Bipolar 1 disorder, depressed (HCC)    Bipolar disorder (HCC)    Depression    History of posttraumatic stress disorder  (PTSD)    Hypertension    Peripheral arterial disease (HCC)    PTSD (post-traumatic stress disorder)    Seizures (HCC)    Sleep apnea    Smoker    Stroke [facility])    Tobacco use disorder     Past Surgical History:  Procedure Laterality Date   ABDOMINAL AORTOGRAM W/LOWER EXTREMITY Left 10/22/2021   Procedure: ABDOMINAL AORTOGRAM W/LOWER EXTREMITY;  Surgeon: Nada Libman, MD;  Location: [facility];  Service: Cardiovascular;  Laterality: Left;   ANTERIOR LATERAL LUMBAR FUSION 4 LEVELS N/A 12/12/2020   Procedure: Anterior Lateral Interbody Fusion Lumbar One-Two, Lumbar Two-Three, Lumbar Three-Four, Lumbar Four-Five;  Surgeon: Marcine Matar, MD;  Location[facility] OR;  Service: Urology;  Laterality: N/A;  Anterior Lateral Interbody Fusion Lumbar One-Two, Lumbar Two-Three, Lumbar Three-Four, Lumbar Four-Five   APPLICATION OF INTRAOPERATIVE CT SCAN N/A 12/13/2020   Procedure: APPLICATION OF INTRAOPERATIVE CT SCAN;  Surgeon: Bedelia Person, MD;  Location: [facility] OR;  Service: Neurosurgery;  Laterality: N/A;   APPLICATION OF WOUND VAC N/A 01/17/2021   Procedure: APPLICATION OF WOUND VAC;  Surgeon: Bedelia Person, MD;  Location: [facility] OR;  Service: Neurosurgery;  Laterality: N/A;   APPLICATION OF WOUND VAC N/A 02/26/2021   Procedure: APPLICATION OF WOUND VAC;  Surgeon: Bedelia Person, MD;  Location: [facility] OR;  Service: Neurosurgery;  Laterality: N/A;   CYSTOSCOPY  12/12/2020   Procedure: CYSTOSCOPY, URETHRAL DILATION, DIFFICULT FOLEY INSERTION;  Surgeon: Marcine Matar, MD;  Location: [facility] OR;  Service: Urology;;   IR FLUORO GUIDE CV LINE RIGHT  12/21/2020   IR IVC FILTER PLMT / S&I /IMG GUID/MOD SED  12/16/2020   IR REMOVAL TUN CV CATH W/O FL  12/31/2020   IR US GUIDE VASC ACCESS RIGHT  12/21/2020   LUMBAR WOUND DEBRIDEMENT N/A 02/26/2021   Procedure: LUMBAR WOUND DEBRIDEMENT;  Surgeon: Bedelia Person, MD;  Location: [facility] OR;  Service: Neurosurgery;  Laterality: N/A;   PERIPHERAL VASCULAR  INTERVENTION Left 10/22/2021   Procedure: PERIPHERAL VASCULAR INTERVENTION;  Surgeon: Nada Libman, MD;  Location: [facility];  Service: Cardiovascular;  Laterality: Left;  SFA   POSTERIOR LUMBAR FUSION 4 LEVEL N/A 12/13/2020   Procedure: Thoracic Ten - ILIAC FUSION, L5-S1 TLIF, Posterior osteotomies for deformity correction and decompression L2-3, L3-4, L4-5;  Surgeon: Bedelia Person, MD;  Location: [facility] OR;  Service: Neurosurgery;  Laterality: N/A;   WOUND EXPLORATION N/A 01/17/2021   Procedure: THORACOLUMBAR WOUND EXPLORATION;  Surgeon: Bedelia Person, MD;  Location: [facility] OR;  Service: Neurosurgery;  Laterality: N/A;    Social History   Socioeconomic History   Marital status: Significant Other    Spouse name: Not on file   Number of children: 1   Years of education: Not on file   Highest education level: Not on file  Occupational History    Employer: [employer]  Tobacco Use   Smoking status: Every Day    Current packs/day: 0.50    Average packs/day: 0.5 packs/day for 20.0 years (10.0 ttl pk-yrs)    Types: Cigarettes    Passive exposure: Never   Smokeless tobacco: Never   Tobacco comments:    8 cigarettes a day   Vaping Use   Vaping status: Never Used  Substance and Sexual Activity   Alcohol use: Yes    Alcohol/week: 2.0 standard drinks of alcohol    Types: 2 Shots of liquor per week   Drug use: Yes    Types: Marijuana    Comment: pt stated he quit 2 weeks ago 08/10/12, pt reports doing marijuana on 11/13/12   Sexual activity: Yes  Other Topics Concern   Not on file  Social History Narrative   ** Merged History Encounter **       Social Determinants of Health   Financial Resource Strain: Not on file  Food Insecurity: Not on file  Transportation Needs: Not on file  Physical Activity: Not on file  Stress: Not on file  Social Connections: Not on file  Intimate Partner Violence: Not on file    Family History  Problem Relation Age of Onset   Seizures  Mother    Breast cancer Mother 27   Hypertension Brother     Current Outpatient Medications  Medication Sig Dispense Refill   acetaminophen (TYLENOL) 500 MG tablet Take 2 tablets (1,000 mg total) by mouth every 6 (six) hours as needed for mild pain. 30 tablet 0   amLODipine (NORVASC) 10 MG tablet Take 10 mg by mouth every morning.     aspirin 81 MG chewable tablet Chew 1 tablet (81 mg total) by mouth daily. 60 tablet 3   baclofen (LIORESAL) 10 MG tablet Take 10 mg by mouth 2 (two) times daily.     cephALEXin (KEFLEX) 500 MG capsule Take 1 capsule (500 mg total) by  mouth 3 (three) times daily. 21 capsule 1   Cholecalciferol (VITAMIN D3) 50 MCG (2000 UT) capsule Take 2,000 Units by mouth 2 (two) times daily.     cloNIDine (CATAPRES) 0.1 MG tablet Take 0.1 mg by mouth every 8 (eight) hours as needed. If systolic blood pressure greater than 160     clopidogrel (PLAVIX) 75 MG tablet Take 1 tablet (75 mg total) by mouth daily. 30 tablet 11   cyclobenzaprine (FLEXERIL) 10 MG tablet Take 1 tablet (10 mg total) by mouth 3 (three) times daily as needed for muscle spasms. (Patient taking differently: Take 10 mg by mouth every 8 (eight) hours as needed for muscle spasms.) 60 tablet 0   docusate sodium (COLACE) 100 MG capsule Take 1 capsule (100 mg total) by mouth 2 (two) times daily as needed for mild constipation. (Patient taking differently: Take 100 mg by mouth every 12 (twelve) hours as needed (for constipation).) 60 capsule 2   Ensure (ENSURE) Take 237 mLs by mouth 3 (three) times daily.     ferrous sulfate 325 (65 FE) MG tablet Take 325 mg by mouth daily with breakfast.     folic acid (FOLVITE) 1 MG tablet Take 1 tablet by mouth daily.     gabapentin (NEURONTIN) 300 MG capsule Take 1 capsule (300 mg total) by mouth 2 (two) times daily. (Patient taking differently: Take 600 mg by mouth 2 (two) times daily.)     hydrOXYzine (VISTARIL) 50 MG capsule Take 50 mg by mouth in the morning and at bedtime.      KEPPRA 750 MG tablet Take 1,500 mg by mouth 2 (two) times daily.     LAMICTAL 100 MG tablet Take 100 mg by mouth 2 (two) times daily.     lisinopril-hydrochlorothiazide (ZESTORETIC) 20-12.5 MG tablet Take 1 tablet by mouth daily.     Magnesium 400 MG TABS Take 400 mg by mouth daily. 14 tablet 0   metroNIDAZOLE (FLAGYL) 500 MG tablet Take 500 mg by mouth 3 (three) times daily.     pantoprazole (PROTONIX) 40 MG tablet Take 1 tablet (40 mg total) by mouth at bedtime. 50 tablet 2   PRESCRIPTION MEDICATION Apply 1 application. topically 3 (three) times a week. Add 1 scoop (500mg ) of compounded powder and 10 pumps of Bassa-Gel to mixing jar, mix together until mixed completely. Apply the gel to the affected area(s) daily or with dressing changes as directed POW Gentamicin-Vancomycin-Itraconazole 33-25-5% Powder Sig: Add 1 scoop (500mg ) of compounded powder and 10 pumps of Bassa-Gel to mixing jar, mix together until mixed completely. Apply the gel to the affected area(s) daily or with dressing changes as directed     QUEtiapine (SEROQUEL) 200 MG tablet Take 200 mg by mouth at bedtime.     senna-docusate (SENOKOT-S) 8.6-50 MG tablet Take 2 tablets by mouth at bedtime.     sertraline (ZOLOFT) 100 MG tablet Take 100 mg by mouth daily after breakfast.     sodium bicarbonate 650 MG tablet Take 650 mg by mouth 3 (three) times daily.     vitamin B-12 (CYANOCOBALAMIN) 500 MCG tablet Take 500 mcg by mouth daily.     zinc gluconate 50 MG tablet Take 50 mg by mouth daily.     rosuvastatin (CRESTOR) 10 MG tablet Take 1 tablet (10 mg total) by mouth daily. 30 tablet 11   No current facility-administered medications for this visit.    No Known Allergies   REVIEW OF SYSTEMS:  [X]  denotes positive finding, [ ]   denotes negative finding Cardiac  Comments:  Chest pain or chest pressure:    Shortness of breath upon exertion: X   Short of breath when lying flat:    Irregular heart rhythm:        Vascular    Pain  in calf, thigh, or hip brought on by ambulation:    Pain in feet at night that wakes you up from your sleep:     Blood clot in your veins:    Leg swelling:  X       Pulmonary    Oxygen at home:    Productive cough:     Wheezing:         Neurologic    Sudden weakness in arms or legs:     Sudden numbness in arms or legs:     Sudden onset of difficulty speaking or slurred speech:    Temporary loss of vision in one eye:     Problems with dizziness:         Gastrointestinal    Blood in stool:     Vomited blood:         Genitourinary    Burning when urinating:     Blood in urine:        Psychiatric    Major depression:         Hematologic    Bleeding problems:    Problems with blood clotting too easily:        Skin    Rashes or ulcers: X BLE venous ulcerations      Constitutional    Fever or chills:      PHYSICAL EXAMINATION:  Vitals:   04/20/23 1041  BP: 138/85  Pulse: 80  Temp: 97.9 F (36.6 C)  TempSrc: Temporal  SpO2: 95%  Weight: 276 lb 11.2 oz (125.5 kg)  Height: 6' (1.829 m)    General:  WDWN in NAD; vital signs documented above Gait: uses Rolator HENT: WNL, normocephalic Pulmonary: normal non-labored breathing , without wheezing Cardiac: regular HR Abdomen: obese, soft Vascular Exam/Pulses: 2+ femoral pulses, 2+ Dp pulse bilaterally Extremities: without ischemic changes, without Gangrene , without cellulitis; with open wound on the posterior right calf about size of nickel Dry without any drainage. No surrounding erythema or other signs of infect. Appears clean. Bilateral lower extremity edema, lipodermatosclerosis, with hyperkeratosis Musculoskeletal: no muscle wasting or atrophy  Neurologic: A&O X 3 Psychiatric:  The pt has Normal affect.   Non-Invasive Vascular Imaging:   +-------+-----------+-----------+------------+------------+  ABI/TBIToday's ABIToday's TBIPrevious ABIPrevious TBI   +-------+-----------+-----------+------------+------------+  Right 0.79       0.57       0.81        0.57          +-------+-----------+-----------+------------+------------+  Left  0.84       0.6        1.05        0.83          +-------+-----------+-----------+------------+------------+   VAS [place] lower extremity arterial duplex:  Summary:  Left: Patent left SFA stent with no stenosis.    ASSESSMENT/PLAN:: 62 y.o. male here for follow up for PAD and chronic venous insufficiency. He has history of mixed disease with bilateral lower extremity recurrent wounds. He has undergone Angiogram with LLE SFA stenting in June of 2023 by Dr. Myra Gianotti. This was for a heal wound. This wound did heal post intervention. He has had subsequent ulcerations on his legs from his venous disease. He is without  claudication, rest pain, or ulceration. Im not sure if he ambulates enough to elicit claudication. His ABI today is overall stable. The left did decrease slightly compared to 6 months ago. Duplex shows patent left SFA stent. His velocities throughout stent are lower with monophasic flow however. At this time he does not warrant any intervention. - We had long discussion about importance of elevation and compression to prevent swelling and recurrent ulceration. He understands that he needs to do his part to help himself and his legs remain in a better place - I have encouraged him to walk as much as he can also to help develop collaterals as well as help with his chronic venous insufficiency - I will refer him to podiatry to help with his nail care/ management - Encourage smoking cessation - Continue Aspirin, Statin, Plavix - he will return in 6 months with repeat LLE arterial duplex and ABI   Graceann Congress, PA-C [facility] [phone]  Clinic MD:   Myra Gianotti

## 2023-04-23 ENCOUNTER — Other Ambulatory Visit: Payer: Self-pay

## 2023-04-23 DIAGNOSIS — I739 Peripheral vascular disease, unspecified: Secondary | ICD-10-CM

## 2023-06-19 ENCOUNTER — Telehealth: Payer: Self-pay

## 2023-06-19 NOTE — Telephone Encounter (Addendum)
 Caller: Patient  Concern: venous stasis ulcer of the leg next to the previous ulceration  Location: right leg  Description: gradual  Treatments:  Instructed to continue elevating, keep wounds clean & dry, finish ABX  Resolution: Appointment scheduled for non-urgent triage appt  Next Appt: Appointment scheduled for 2/14 @ 0945 with PA  Pt also wants HH to wrap his leg(s) if he needs dressing changes.  Pt needs to be fitted for compression stockings.

## 2023-06-24 ENCOUNTER — Ambulatory Visit (INDEPENDENT_AMBULATORY_CARE_PROVIDER_SITE_OTHER): Payer: No Typology Code available for payment source

## 2023-06-24 VITALS — BP 132/78 | HR 93 | Temp 97.9°F | Ht 73.0 in | Wt 282.5 lb

## 2023-06-24 DIAGNOSIS — I872 Venous insufficiency (chronic) (peripheral): Secondary | ICD-10-CM | POA: Diagnosis not present

## 2023-06-24 DIAGNOSIS — I739 Peripheral vascular disease, unspecified: Secondary | ICD-10-CM | POA: Diagnosis not present

## 2023-06-24 NOTE — Progress Notes (Signed)
Office Note     CC:  follow up Requesting Provider:  Cleta Alberts, MD  HPI: Keith Dixon is a 63 y.o. ([birth year]) male who presents for evaluation of new venous ulceration of the right medial lower leg.  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  He has had a left SFA stenting in June 2023 by Dr. Myra Gianotti.  This was performed due to a left heel wound which healed postintervention.  He has had recurrent ulcerations from venous insufficiency that have been treated with Unna boots.  He states he has been wearing compression most days.  He makes an effort to elevate his legs during the day.  He denies any fevers, chills, nausea/vomiting.  He denies any claudication symptoms.  As of December 2024 left SFA stent widely patent and right great toe pressure of 88 mmHg.   Past Medical History:  Diagnosis Date   Anxiety    Arthritis    Bipolar 1 disorder, depressed (HCC)    Bipolar disorder (HCC)    Depression    History of posttraumatic stress disorder (PTSD)    Hypertension    Peripheral arterial disease (HCC)    PTSD (post-traumatic stress disorder)    Seizures (HCC)    Sleep apnea    Smoker    Stroke [facility])    Tobacco use disorder     Past Surgical History:  Procedure Laterality Date   ABDOMINAL AORTOGRAM W/LOWER EXTREMITY Left 10/22/2021   Procedure: ABDOMINAL AORTOGRAM W/LOWER EXTREMITY;  Surgeon: Nada Libman, MD;  Location: [facility];  Service: Cardiovascular;  Laterality: Left;   ANTERIOR LATERAL LUMBAR FUSION 4 LEVELS N/A 12/12/2020   Procedure: Anterior Lateral Interbody Fusion Lumbar One-Two, Lumbar Two-Three, Lumbar Three-Four, Lumbar Four-Five;  Surgeon: Marcine Matar, MD;  Location: [facility] OR;  Service: Urology;  Laterality: N/A;  Anterior Lateral Interbody Fusion Lumbar One-Two, Lumbar Two-Three, Lumbar Three-Four, Lumbar Four-Five   APPLICATION OF INTRAOPERATIVE CT SCAN N/A 12/13/2020    Procedure: APPLICATION OF INTRAOPERATIVE CT SCAN;  Surgeon: Bedelia Person, MD;  Location: [facility] OR;  Service: Neurosurgery;  Laterality: N/A;   APPLICATION OF WOUND VAC N/A 01/17/2021   Procedure: APPLICATION OF WOUND VAC;  Surgeon: Bedelia Person, MD;  Location: [facility] OR;  Service: Neurosurgery;  Laterality: N/A;   APPLICATION OF WOUND VAC N/A 02/26/2021   Procedure: APPLICATION OF WOUND VAC;  Surgeon: Bedelia Person, MD;  Location: [facility] OR;  Service: Neurosurgery;  Laterality: N/A;   CYSTOSCOPY  12/12/2020   Procedure: CYSTOSCOPY, URETHRAL DILATION, DIFFICULT FOLEY INSERTION;  Surgeon: Marcine Matar, MD;  Location: [facility] OR;  Service: Urology;;   IR FLUORO GUIDE CV LINE RIGHT  12/21/2020   IR IVC FILTER PLMT / S&I /IMG GUID/MOD SED  12/16/2020   IR REMOVAL TUN CV CATH W/O FL  12/31/2020   IR US GUIDE VASC ACCESS RIGHT  12/21/2020   LUMBAR WOUND DEBRIDEMENT N/A 02/26/2021   Procedure: LUMBAR WOUND DEBRIDEMENT;  Surgeon: Bedelia Person, MD;  Location: [facility] OR;  Service: Neurosurgery;  Laterality: N/A;   PERIPHERAL VASCULAR INTERVENTION Left 10/22/2021   Procedure: PERIPHERAL VASCULAR INTERVENTION;  Surgeon: Nada Libman, MD;  Location: [facility];  Service: Cardiovascular;  Laterality: Left;  SFA   POSTERIOR LUMBAR FUSION 4 LEVEL N/A 12/13/2020   Procedure: Thoracic Ten - ILIAC FUSION, L5-S1 TLIF, Posterior osteotomies for deformity correction and decompression L2-3, L3-4, L4-5;  Surgeon: Bedelia Person, MD;  Location: [facility] OR;  Service: Neurosurgery;  Laterality: N/A;   WOUND EXPLORATION N/A 01/17/2021   Procedure: THORACOLUMBAR WOUND EXPLORATION;  Surgeon: Bedelia Person, MD;  Location: [facility] OR;  Service: Neurosurgery;  Laterality: N/A;    Social History   Socioeconomic History   Marital status: Significant Other    Spouse name: Not on file   Number of children: 1   Years of education: Not on file   Highest education level: Not on file  Occupational History    Employer: [employer]  Tobacco Use   Smoking status: Every Day    Current packs/day: 0.50    Average packs/day: 0.5 packs/day for 20.0 years (10.0 ttl pk-yrs)    Types: Cigarettes    Passive exposure: Never   Smokeless tobacco: Never   Tobacco comments:    8 cigarettes a day   Vaping Use   Vaping status: Never Used  Substance and Sexual Activity   Alcohol use: Yes    Alcohol/week: 2.0 standard drinks of alcohol    Types: 2 Shots of liquor per week   Drug use: Yes    Types: Marijuana    Comment: pt stated he quit 2 weeks ago 08/10/12, pt reports doing marijuana on 11/13/12   Sexual activity: Yes  Other Topics Concern   Not on file  Social History Narrative   ** Merged History Encounter **       Social Drivers of Corporate investment banker Strain: Not on file  Food Insecurity: Not on file  Transportation Needs: Not on file  Physical Activity: Not on file  Stress: Not on file  Social Connections: Not on file  Intimate Partner Violence: Not on file    Family History  Problem Relation Age of Onset   Seizures Mother    Breast cancer Mother 45   Hypertension Brother     Current Outpatient Medications  Medication Sig Dispense Refill   acetaminophen (TYLENOL) 500 MG tablet Take 2 tablets (1,000 mg total) by mouth every 6 (six) hours as needed for mild pain. 30 tablet 0   amLODipine (NORVASC) 10 MG tablet Take 10 mg by mouth every morning.     aspirin 81 MG chewable tablet Chew 1 tablet (81 mg total) by mouth daily. 60 tablet 3   baclofen (LIORESAL) 10 MG tablet Take 10 mg by mouth 2 (two) times daily.     cephALEXin (KEFLEX) 500 MG capsule Take 1 capsule (500 mg total) by mouth 3 (three) times daily. 21 capsule 1   Cholecalciferol (VITAMIN D3) 50 MCG (2000 UT) capsule Take 2,000 Units by mouth 2 (two) times daily.     cloNIDine (CATAPRES) 0.1 MG tablet Take 0.1 mg by mouth every 8 (eight) hours as needed. If systolic blood pressure greater than 160     clopidogrel (PLAVIX) 75 MG tablet  Take 1 tablet (75 mg total) by mouth daily. 30 tablet 11   cyclobenzaprine (FLEXERIL) 10 MG tablet Take 1 tablet (10 mg total) by mouth 3 (three) times daily as needed for muscle spasms. (Patient taking differently: Take 10 mg by mouth every 8 (eight) hours as needed for muscle spasms.) 60 tablet 0   docusate sodium (COLACE) 100 MG capsule Take 1 capsule (100 mg total) by mouth 2 (two) times daily as needed for mild constipation. (Patient taking differently: Take 100 mg by mouth every 12 (twelve) hours as needed (for constipation).) 60 capsule 2   Ensure (ENSURE) Take 237 mLs by mouth 3 (  three) times daily.     ferrous sulfate 325 (65 FE) MG tablet Take 325 mg by mouth daily with breakfast.     folic acid (FOLVITE) 1 MG tablet Take 1 tablet by mouth daily.     gabapentin (NEURONTIN) 300 MG capsule Take 1 capsule (300 mg total) by mouth 2 (two) times daily. (Patient taking differently: Take 600 mg by mouth 2 (two) times daily.)     hydrOXYzine (VISTARIL) 50 MG capsule Take 50 mg by mouth in the morning and at bedtime.     KEPPRA 750 MG tablet Take 1,500 mg by mouth 2 (two) times daily.     LAMICTAL 100 MG tablet Take 100 mg by mouth 2 (two) times daily.     lisinopril-hydrochlorothiazide (ZESTORETIC) 20-12.5 MG tablet Take 1 tablet by mouth daily.     Magnesium 400 MG TABS Take 400 mg by mouth daily. 14 tablet 0   metroNIDAZOLE (FLAGYL) 500 MG tablet Take 500 mg by mouth 3 (three) times daily.     pantoprazole (PROTONIX) 40 MG tablet Take 1 tablet (40 mg total) by mouth at bedtime. 50 tablet 2   PRESCRIPTION MEDICATION Apply 1 application. topically 3 (three) times a week. Add 1 scoop (500mg ) of compounded powder and 10 pumps of Bassa-Gel to mixing jar, mix together until mixed completely. Apply the gel to the affected area(s) daily or with dressing changes as directed POW Gentamicin-Vancomycin-Itraconazole 33-25-5% Powder Sig: Add 1 scoop (500mg ) of compounded powder and 10 pumps of Bassa-Gel to  mixing jar, mix together until mixed completely. Apply the gel to the affected area(s) daily or with dressing changes as directed     QUEtiapine (SEROQUEL) 200 MG tablet Take 200 mg by mouth at bedtime.     rosuvastatin (CRESTOR) 10 MG tablet Take 1 tablet (10 mg total) by mouth daily. 30 tablet 11   senna-docusate (SENOKOT-S) 8.6-50 MG tablet Take 2 tablets by mouth at bedtime.     sertraline (ZOLOFT) 100 MG tablet Take 100 mg by mouth daily after breakfast.     sodium bicarbonate 650 MG tablet Take 650 mg by mouth 3 (three) times daily.     vitamin B-12 (CYANOCOBALAMIN) 500 MCG tablet Take 500 mcg by mouth daily.     zinc gluconate 50 MG tablet Take 50 mg by mouth daily.     No current facility-administered medications for this visit.    No Known Allergies   REVIEW OF SYSTEMS:   [X]  denotes positive finding, [ ]  denotes negative finding Cardiac  Comments:  Chest pain or chest pressure:    Shortness of breath upon exertion:    Short of breath when lying flat:    Irregular heart rhythm:        Vascular    Pain in calf, thigh, or hip brought on by ambulation:    Pain in feet at night that wakes you up from your sleep:     Blood clot in your veins:    Leg swelling:         Pulmonary    Oxygen at home:    Productive cough:     Wheezing:         Neurologic    Sudden weakness in arms or legs:     Sudden numbness in arms or legs:     Sudden onset of difficulty speaking or slurred speech:    Temporary loss of vision in one eye:     Problems with dizziness:  Gastrointestinal    Blood in stool:     Vomited blood:         Genitourinary    Burning when urinating:     Blood in urine:        Psychiatric    Major depression:         Hematologic    Bleeding problems:    Problems with blood clotting too easily:        Skin    Rashes or ulcers:        Constitutional    Fever or chills:      PHYSICAL EXAMINATION:  Vitals:   06/24/23 1014  BP: 132/78  Pulse:  93  Temp: 97.9 F (36.6 C)  SpO2: 98%  Weight: 282 lb 8 oz (128.1 kg)  Height: 6\' 1"  (1.854 m)    General:  WDWN in NAD; vital signs documented above Gait: Not observed HENT: WNL, normocephalic Pulmonary: normal non-labored breathing , without Rales, rhonchi,  wheezing Cardiac: regular HR Abdomen: soft, NT, no masses Skin: without rashes Vascular Exam/Pulses: absent pedal pulses Extremities: Pitting edema bilateral lower extremities with venous ulceration of the right medial lower leg with clear fluid draining; no wounds involving the toes of the foot Musculoskeletal: no muscle wasting or atrophy  Neurologic: A&O X 3 Psychiatric:  The pt has Normal affect.   Non-Invasive Vascular Imaging:   December 2024 imaging demonstrates widely patent left SFA stents  ABI/TBIToday's ABIToday's TBIPrevious ABIPrevious TBI  +-------+-----------+-----------+------------+------------+  Right 0.79       0.57       0.81        0.57          +-------+-----------+-----------+------------+------------+  Left  0.84       0.6        1.05        0.83          +-------+-----------+-----------+------------+----------     ASSESSMENT/PLAN:: 63 y.o. male here for evaluation of recurrent venous ulcer of the right medial lower leg  Keith Dixon is a 63 year old male well-known to [facility] for management of mixed venous and arterial insufficiency.  He has developed a recurrent venous ulceration of the right medial lower leg which is draining clear fluid.  Encouraged him to wear compression on the left leg on a daily basis to prevent recurrent ulcers on the left.  He will be placed in an Unna boot on the right leg and refer to home health to continue Unna boot changes weekly for likely at least 1 month.  Pedal pulses are absent in the right foot however in December his right great toe pressure was 85 mmHg.  He will return to office for wound check in 4 to 6 weeks.  He knows to call/return office  sooner if wound worsens or fails to heal.   Emilie Rutter, PA-C [facility] [phone]  Clinic MD:  Randie Heinz

## 2023-07-27 ENCOUNTER — Ambulatory Visit

## 2023-08-03 ENCOUNTER — Ambulatory Visit: Payer: No Typology Code available for payment source

## 2023-08-18 ENCOUNTER — Telehealth: Payer: Self-pay

## 2023-08-18 NOTE — Telephone Encounter (Signed)
 Triage: -pt called stating his [facility] RN was there and did the weekly Unna boot change and a new wound was appearing but old one were getting better.  -on chart review, noted pt had a 4-6 wk appt past due for wound check.   -booked planned wound check appt.

## 2023-08-19 ENCOUNTER — Ambulatory Visit (INDEPENDENT_AMBULATORY_CARE_PROVIDER_SITE_OTHER): Admitting: Physician Assistant

## 2023-08-19 VITALS — BP 159/95 | HR 95 | Temp 97.6°F | Ht 73.0 in | Wt 282.0 lb

## 2023-08-19 DIAGNOSIS — I739 Peripheral vascular disease, unspecified: Secondary | ICD-10-CM

## 2023-08-19 DIAGNOSIS — I872 Venous insufficiency (chronic) (peripheral): Secondary | ICD-10-CM | POA: Diagnosis not present

## 2023-08-21 NOTE — Progress Notes (Signed)
 Office Note   History of Present Illness   Keith Dixon is a 63 y.o. (Apr 04, 1961) male who presents for wound check.  He has a history of left SFA stenting in June 2023 by Dr. Myra Gianotti.  This was performed for critical limb ischemia with a left heel wound, which healed postintervention.  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  At his last visit with our office, he had a new right medial lower leg ulceration.  He returns today for follow-up.  He denies any claudication or rest pain.  He denies any wounds of the feet.  He is regularly receiving Unna boot changes once weekly by home health.  He took his most recent Unna boot off last night and noted that his medial lower leg wound has gotten much smaller.  He also noted some new superficial abrasions to his right shin.  He denies any fevers or chills.  Current Outpatient Medications  Medication Sig Dispense Refill   acetaminophen (TYLENOL) 500 MG tablet Take 2 tablets (1,000 mg total) by mouth every 6 (six) hours as needed for mild pain. 30 tablet 0   amLODipine (NORVASC) 10 MG tablet Take 10 mg by mouth every morning.     aspirin 81 MG chewable tablet Chew 1 tablet (81 mg total) by mouth daily. 60 tablet 3   baclofen (LIORESAL) 10 MG tablet Take 10 mg by mouth 2 (two) times daily.     cephALEXin (KEFLEX) 500 MG capsule Take 1 capsule (500 mg total) by mouth 3 (three) times daily. 21 capsule 1   Cholecalciferol (VITAMIN D3) 50 MCG (2000 UT) capsule Take 2,000 Units by mouth 2 (two) times daily.     cloNIDine (CATAPRES) 0.1 MG tablet Take 0.1 mg by mouth every 8 (eight) hours as needed. If systolic blood pressure greater than 160     clopidogrel (PLAVIX) 75 MG tablet Take 1 tablet (75 mg total) by mouth daily. 30 tablet 11   cyclobenzaprine (FLEXERIL) 10 MG tablet Take 1 tablet (10 mg total) by mouth 3 (three) times daily as needed for muscle spasms. (Patient taking  differently: Take 10 mg by mouth every 8 (eight) hours as needed for muscle spasms.) 60 tablet 0   docusate sodium (COLACE) 100 MG capsule Take 1 capsule (100 mg total) by mouth 2 (two) times daily as needed for mild constipation. (Patient taking differently: Take 100 mg by mouth every 12 (twelve) hours as needed (for constipation).) 60 capsule 2   Ensure (ENSURE) Take 237 mLs by mouth 3 (three) times daily.     ferrous sulfate 325 (65 FE) MG tablet Take 325 mg by mouth daily with breakfast.     folic acid (FOLVITE) 1 MG tablet Take 1 tablet by mouth daily.     gabapentin (NEURONTIN) 300 MG capsule Take 1 capsule (300 mg total) by mouth 2 (two) times daily. (Patient taking differently: Take 600 mg by mouth 2 (two) times daily.)     hydrOXYzine (VISTARIL) 50 MG capsule Take 50 mg by mouth in the morning and at bedtime.     KEPPRA 750 MG tablet Take 1,500 mg by mouth 2 (two) times daily.     LAMICTAL 100 MG tablet Take 100 mg by mouth 2 (two) times daily.     lisinopril-hydrochlorothiazide (ZESTORETIC) 20-12.5 MG tablet Take 1 tablet by mouth daily.     Magnesium 400 MG TABS Take 400 mg by mouth daily.  14 tablet 0   metroNIDAZOLE (FLAGYL) 500 MG tablet Take 500 mg by mouth 3 (three) times daily.     pantoprazole (PROTONIX) 40 MG tablet Take 1 tablet (40 mg total) by mouth at bedtime. 50 tablet 2   PRESCRIPTION MEDICATION Apply 1 application. topically 3 (three) times a week. Add 1 scoop (500mg ) of compounded powder and 10 pumps of Bassa-Gel to mixing jar, mix together until mixed completely. Apply the gel to the affected area(s) daily or with dressing changes as directed POW Gentamicin-Vancomycin-Itraconazole 33-25-5% Powder Sig: Add 1 scoop (500mg ) of compounded powder and 10 pumps of Bassa-Gel to mixing jar, mix together until mixed completely. Apply the gel to the affected area(s) daily or with dressing changes as directed     QUEtiapine (SEROQUEL) 200 MG tablet Take 200 mg by mouth at bedtime.      senna-docusate (SENOKOT-S) 8.6-50 MG tablet Take 2 tablets by mouth at bedtime.     sertraline (ZOLOFT) 100 MG tablet Take 100 mg by mouth daily after breakfast.     sodium bicarbonate 650 MG tablet Take 650 mg by mouth 3 (three) times daily.     vitamin B-12 (CYANOCOBALAMIN) 500 MCG tablet Take 500 mcg by mouth daily.     zinc gluconate 50 MG tablet Take 50 mg by mouth daily.     rosuvastatin (CRESTOR) 10 MG tablet Take 1 tablet (10 mg total) by mouth daily. 30 tablet 11   No current facility-administered medications for this visit.    REVIEW OF SYSTEMS (negative unless checked):   Cardiac:  []  Chest pain or chest pressure? []  Shortness of breath upon activity? []  Shortness of breath when lying flat? []  Irregular heart rhythm?  Vascular:  []  Pain in calf, thigh, or hip brought on by walking? []  Pain in feet at night that wakes you up from your sleep? []  Blood clot in your veins? [x]  Leg swelling?  Pulmonary:  []  Oxygen at home? []  Productive cough? []  Wheezing?  Neurologic:  []  Sudden weakness in arms or legs? []  Sudden numbness in arms or legs? []  Sudden onset of difficult speaking or slurred speech? []  Temporary loss of vision in one eye? []  Problems with dizziness?  Gastrointestinal:  []  Blood in stool? []  Vomited blood?  Genitourinary:  []  Burning when urinating? []  Blood in urine?  Psychiatric:  []  Major depression  Hematologic:  []  Bleeding problems? []  Problems with blood clotting?  Dermatologic:  []  Rashes or ulcers?  Constitutional:  []  Fever or chills?  Ear/Nose/Throat:  []  Change in hearing? []  Nose bleeds? []  Sore throat?  Musculoskeletal:  []  Back pain? []  Joint pain? []  Muscle pain?   Physical Examination   Vitals:   08/19/23 1009  BP: (!) 159/95  Pulse: 95  Temp: 97.6 F (36.4 C)  TempSrc: Temporal  SpO2: 95%  Weight: 282 lb (127.9 kg)  Height: 6\' 1"  (1.854 m)   Body mass index is 37.21 kg/m.  General:  WDWN in NAD;  vital signs documented above Gait: Not observed HENT: WNL, normocephalic Pulmonary: normal non-labored breathing , without rales, rhonchi,  wheezing Cardiac: regular Abdomen: soft, NT, no masses Skin: without rashes Vascular Exam/Pulses: Nonpalpable pedal pulses, brisk Doppler signals bilaterally Extremities: Pitting edema of BLE, right greater than left.  Healing venous ulceration of the right medial lower leg with serous drainage. Several small skin abrasions to the right pretibial area Musculoskeletal: no muscle wasting or atrophy  Neurologic: A&O X 3;  No focal weakness or paresthesias are  detected Psychiatric:  The pt has Normal affect.       Medical Decision Making   Keith Dixon is a 63 y.o. male who presents for wound check  We followed the patient for mixed venous and arterial insufficiency.  He presents today for wound check He has been receiving once weekly Unna boot changes by home health.  His right medial lower leg wound has gotten much smaller since his last visit with our office.  He does note some new superficial abrasions to the right pretibial area. His last arterial studies demonstrated a great toe pressure of 88 mmHg.  This is appropriate for wound healing I have recommended that he continue to receive regular Unna boot changes by home health.  I have also encouraged him to continue to elevate his legs to reduce swelling.  I am hopeful that his venous ulcerations will continue to heal with regular wound care.  He does not appear to need any arterial interventions at this time He can follow-up with our office in 2 months with repeat arterial studies and wound check   Loel Dubonnet PA-C [facility] [place] Office: [phone]  Clinic MD: Randie Heinz

## 2023-08-25 ENCOUNTER — Telehealth: Payer: Self-pay

## 2023-08-25 NOTE — Telephone Encounter (Signed)
 Spoke to pt's [facility] nurse Josiah Nigh who was given VO for weekly unna boot changes. She will keep us  updated on changes in wound condition or anything else that may arise and is aware pt is returning for f/u  here in 2 months.

## 2023-10-19 ENCOUNTER — Ambulatory Visit (INDEPENDENT_AMBULATORY_CARE_PROVIDER_SITE_OTHER): Payer: No Typology Code available for payment source | Admitting: Physician Assistant

## 2023-10-19 ENCOUNTER — Ambulatory Visit (HOSPITAL_COMMUNITY)
Admission: RE | Admit: 2023-10-19 | Discharge: 2023-10-19 | Disposition: A | Discharge status: Home / Self Care | Payer: No Typology Code available for payment source | Source: Ambulatory Visit | Attending: Surgery | Admitting: Surgery

## 2023-10-19 VITALS — BP 152/92 | HR 88 | Temp 98.4°F | Ht 73.0 in | Wt 274.5 lb

## 2023-10-19 DIAGNOSIS — I739 Peripheral vascular disease, unspecified: Secondary | ICD-10-CM

## 2023-10-19 DIAGNOSIS — I872 Venous insufficiency (chronic) (peripheral): Secondary | ICD-10-CM | POA: Diagnosis not present

## 2023-10-19 NOTE — Progress Notes (Signed)
 Office Note   History of Present Illness   Keith Dixon is a 63 y.o. (1961/01/13) male who presents for follow up.  He has a history of left SFA stenting in June 2023 by Dr. Charlotte Cookey.  This was performed for critical limb ischemia with a left heel wound, which healed postintervention.  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  At his last follow-up with our office he was regularly receiving Unna boot changes by home health once weekly.  He had healing abrasions to the right lower leg.  He returns today for follow-up.  He says that he is doing well.  He endorses continued, recurrent venous ulcerations to bilateral lower legs.  He says that these will heal after serial Unna boot application and then he itches his skin and it pops back open again.  He denies any rest pain or tissue loss of the feet.  He is ambulating without claudication, however his mobility is limited by his back issues.  Current Outpatient Medications  Medication Sig Dispense Refill   acetaminophen  (TYLENOL ) 500 MG tablet Take 2 tablets (1,000 mg total) by mouth every 6 (six) hours as needed for mild pain. 30 tablet 0   amLODipine  (NORVASC ) 10 MG tablet Take 10 mg by mouth every morning.     aspirin  81 MG chewable tablet Chew 1 tablet (81 mg total) by mouth daily. 60 tablet 3   baclofen  (LIORESAL ) 10 MG tablet Take 10 mg by mouth 2 (two) times daily.     cephALEXin  (KEFLEX ) 500 MG capsule Take 1 capsule (500 mg total) by mouth 3 (three) times daily. 21 capsule 1   Cholecalciferol  (VITAMIN D3) 50 MCG (2000 UT) capsule Take 2,000 Units by mouth 2 (two) times daily.     cloNIDine (CATAPRES) 0.1 MG tablet Take 0.1 mg by mouth every 8 (eight) hours as needed. If systolic blood pressure greater than 160     clopidogrel  (PLAVIX ) 75 MG tablet Take 1 tablet (75 mg total) by mouth daily. 30 tablet 11   cyclobenzaprine  (FLEXERIL ) 10 MG tablet Take 1 tablet (10  mg total) by mouth 3 (three) times daily as needed for muscle spasms. (Patient taking differently: Take 10 mg by mouth every 8 (eight) hours as needed for muscle spasms.) 60 tablet 0   docusate sodium  (COLACE) 100 MG capsule Take 1 capsule (100 mg total) by mouth 2 (two) times daily as needed for mild constipation. (Patient taking differently: Take 100 mg by mouth every 12 (twelve) hours as needed (for constipation).) 60 capsule 2   Ensure (ENSURE) Take 237 mLs by mouth 3 (three) times daily.     ferrous sulfate 325 (65 FE) MG tablet Take 325 mg by mouth daily with breakfast.     folic acid  (FOLVITE ) 1 MG tablet Take 1 tablet by mouth daily.     gabapentin  (NEURONTIN ) 300 MG capsule Take 1 capsule (300 mg total) by mouth 2 (two) times daily. (Patient taking differently: Take 600 mg by mouth 2 (two) times daily.)     hydrOXYzine  (VISTARIL ) 50 MG capsule Take 50 mg by mouth in the morning and at bedtime.     KEPPRA  750 MG tablet Take 1,500 mg by mouth 2 (two) times daily.     LAMICTAL  100 MG tablet Take 100 mg by mouth 2 (two) times daily.     lisinopril -hydrochlorothiazide  (ZESTORETIC ) 20-12.5 MG tablet Take 1 tablet by mouth daily.  Magnesium  400 MG TABS Take 400 mg by mouth daily. 14 tablet 0   metroNIDAZOLE  (FLAGYL ) 500 MG tablet Take 500 mg by mouth 3 (three) times daily.     pantoprazole  (PROTONIX ) 40 MG tablet Take 1 tablet (40 mg total) by mouth at bedtime. 50 tablet 2   PRESCRIPTION MEDICATION Apply 1 application. topically 3 (three) times a week. Add 1 scoop (500mg ) of compounded powder and 10 pumps of Bassa-Gel to mixing jar, mix together until mixed completely. Apply the gel to the affected area(s) daily or with dressing changes as directed POW Gentamicin-Vancomycin -Itraconazole 33-25-5% Powder Sig: Add 1 scoop (500mg ) of compounded powder and 10 pumps of Bassa-Gel to mixing jar, mix together until mixed completely. Apply the gel to the affected area(s) daily or with dressing changes as  directed     QUEtiapine  (SEROQUEL ) 200 MG tablet Take 200 mg by mouth at bedtime.     rosuvastatin  (CRESTOR ) 10 MG tablet Take 1 tablet (10 mg total) by mouth daily. 30 tablet 11   senna-docusate (SENOKOT-S) 8.6-50 MG tablet Take 2 tablets by mouth at bedtime.     sertraline  (ZOLOFT ) 100 MG tablet Take 100 mg by mouth daily after breakfast.     sodium bicarbonate  650 MG tablet Take 650 mg by mouth 3 (three) times daily.     vitamin B-12 (CYANOCOBALAMIN ) 500 MCG tablet Take 500 mcg by mouth daily.     zinc  gluconate 50 MG tablet Take 50 mg by mouth daily.     No current facility-administered medications for this visit.    REVIEW OF SYSTEMS (negative unless checked):   Cardiac:  []  Chest pain or chest pressure? []  Shortness of breath upon activity? []  Shortness of breath when lying flat? []  Irregular heart rhythm?  Vascular:  []  Pain in calf, thigh, or hip brought on by walking? []  Pain in feet at night that wakes you up from your sleep? []  Blood clot in your veins? []  Leg swelling?  Pulmonary:  []  Oxygen at home? []  Productive cough? []  Wheezing?  Neurologic:  []  Sudden weakness in arms or legs? []  Sudden numbness in arms or legs? []  Sudden onset of difficult speaking or slurred speech? []  Temporary loss of vision in one eye? []  Problems with dizziness?  Gastrointestinal:  []  Blood in stool? []  Vomited blood?  Genitourinary:  []  Burning when urinating? []  Blood in urine?  Psychiatric:  []  Major depression  Hematologic:  []  Bleeding problems? []  Problems with blood clotting?  Dermatologic:  []  Rashes or ulcers?  Constitutional:  []  Fever or chills?  Ear/Nose/Throat:  []  Change in hearing? []  Nose bleeds? []  Sore throat?  Musculoskeletal:  [x]  Back pain? []  Joint pain? []  Muscle pain?   Physical Examination    Vitals:   10/19/23 1235  BP: (!) 152/92  Pulse: 88  Temp: 98.4 F (36.9 C)  TempSrc: Temporal  SpO2: 96%  Weight: 274 lb 8 oz  (124.5 kg)  Height: 6\' 1"  (1.854 m)   Body mass index is 36.22 kg/m.  General:  WDWN in NAD; vital signs documented above Gait: Not observed HENT: WNL, normocephalic Pulmonary: normal non-labored breathing , without rales, rhonchi,  wheezing Cardiac: regular Abdomen: soft, NT, no masses Skin: without rashes Vascular Exam/Pulses: brisk right DP/PT doppler signals. Brisk left PT and peroneal doppler signals Extremities: scabbed over venous ulcers to bilateral medial ankles Musculoskeletal: no muscle wasting or atrophy  Neurologic: A&O X 3;  No focal weakness or paresthesias are detected Psychiatric:  The  pt has Normal affect.  Non-Invasive Vascular imaging   ABI (10/19/2023) R:  ABI: 0.78 (0.79),  PT: tri TBI:  1.05 L:  ABI: unable to record DP: audible, not recorded TBI: 0.89   LLE Arterial Duplex (10/19/2023) Patent left SFA stent without stenosis  Medical Decision Making   Keith Dixon is a 63 y.o. male who presents for surveillance of PAD  Based on the patient's vascular studies, his ABIs on the right are essentially unchanged at 0.78.  His ABIs on the left could not be recorded, however his TBI was recorded as 0.89 Left lower extremity arterial duplex demonstrates a patent left SFA stent without stenosis On exam he has healing, small scabbed venous ulcerations to bilateral medial ankles.  He is receiving weekly Unna boot changes by home health for these ulcerations.  He says that these wounds continue to come and go mostly because he keeps itching his skin until it breaks open He has brisk right DP/PT Doppler signals and left PT/peroneal Doppler signals He can continue his unna boot changes by home health until his current venous ulcerations have healed.  I have encouraged him to not itch his legs to prevent recurrent venous ulceration. He can follow up with our office in 1 year with LLE arterial duplex and ABIs   Deneise Finlay PA-C [facility] Office: [phone]  Clinic MD: Charlotte Cookey
